# Patient Record
Sex: Female | Born: 1945
Health system: Southern US, Community
[De-identification: ages and names within clinical notes are randomized; demographics above are authoritative.]

## PROBLEM LIST (undated history)

## (undated) DIAGNOSIS — R011 Cardiac murmur, unspecified: Secondary | ICD-10-CM

## (undated) DIAGNOSIS — J45909 Unspecified asthma, uncomplicated: Secondary | ICD-10-CM

## (undated) DIAGNOSIS — F329 Major depressive disorder, single episode, unspecified: Secondary | ICD-10-CM

## (undated) DIAGNOSIS — J189 Pneumonia, unspecified organism: Secondary | ICD-10-CM

## (undated) DIAGNOSIS — G43909 Migraine, unspecified, not intractable, without status migrainosus: Secondary | ICD-10-CM

## (undated) DIAGNOSIS — I639 Cerebral infarction, unspecified: Secondary | ICD-10-CM

## (undated) DIAGNOSIS — Z8719 Personal history of other diseases of the digestive system: Secondary | ICD-10-CM

## (undated) DIAGNOSIS — Z87828 Personal history of other (healed) physical injury and trauma: Secondary | ICD-10-CM

## (undated) DIAGNOSIS — A70 Chlamydia psittaci infections: Secondary | ICD-10-CM

## (undated) DIAGNOSIS — Z8711 Personal history of peptic ulcer disease: Secondary | ICD-10-CM

## (undated) DIAGNOSIS — F32A Depression, unspecified: Secondary | ICD-10-CM

## (undated) DIAGNOSIS — Z87442 Personal history of urinary calculi: Secondary | ICD-10-CM

## (undated) DIAGNOSIS — H9190 Unspecified hearing loss, unspecified ear: Secondary | ICD-10-CM

## (undated) DIAGNOSIS — M1990 Unspecified osteoarthritis, unspecified site: Secondary | ICD-10-CM

## (undated) DIAGNOSIS — I4891 Unspecified atrial fibrillation: Secondary | ICD-10-CM

## (undated) DIAGNOSIS — D649 Anemia, unspecified: Secondary | ICD-10-CM

## (undated) HISTORY — PX: ROTATOR CUFF REPAIR: SHX139

## (undated) HISTORY — DX: Personal history of other diseases of the digestive system: Z87.19

## (undated) HISTORY — PX: PARTIAL HYSTERECTOMY: SHX80

## (undated) HISTORY — DX: Unspecified hearing loss, unspecified ear: H91.90

## (undated) HISTORY — DX: Personal history of peptic ulcer disease: Z87.11

## (undated) HISTORY — PX: ANKLE FRACTURE SURGERY: SHX122

## (undated) HISTORY — DX: Unspecified osteoarthritis, unspecified site: M19.90

## (undated) HISTORY — PX: BREAST LUMPECTOMY: SHX2

## (undated) HISTORY — PX: APPENDECTOMY: SHX54

## (undated) HISTORY — DX: Cerebral infarction, unspecified: I63.9

## (undated) HISTORY — DX: Personal history of other (healed) physical injury and trauma: Z87.828

## (undated) HISTORY — DX: Migraine, unspecified, not intractable, without status migrainosus: G43.909

---

## 2003-08-17 ENCOUNTER — Inpatient Hospital Stay (HOSPITAL_COMMUNITY): Admission: EM | Admit: 2003-08-17 | Discharge: 2003-08-19 | Payer: Self-pay | Admitting: Emergency Medicine

## 2003-08-17 ENCOUNTER — Encounter: Payer: Self-pay | Admitting: Emergency Medicine

## 2003-08-18 ENCOUNTER — Encounter: Payer: Self-pay | Admitting: Internal Medicine

## 2003-08-19 ENCOUNTER — Encounter: Payer: Self-pay | Admitting: *Deleted

## 2005-02-11 ENCOUNTER — Ambulatory Visit: Payer: Self-pay | Admitting: *Deleted

## 2005-02-14 ENCOUNTER — Ambulatory Visit (HOSPITAL_COMMUNITY): Admission: RE | Admit: 2005-02-14 | Discharge: 2005-02-14 | Payer: Self-pay | Admitting: *Deleted

## 2005-02-14 ENCOUNTER — Ambulatory Visit: Payer: Self-pay | Admitting: Cardiology

## 2005-02-20 ENCOUNTER — Ambulatory Visit: Payer: Self-pay | Admitting: *Deleted

## 2005-03-04 ENCOUNTER — Ambulatory Visit (HOSPITAL_COMMUNITY): Admission: RE | Admit: 2005-03-04 | Discharge: 2005-03-04 | Payer: Self-pay | Admitting: *Deleted

## 2005-03-06 ENCOUNTER — Inpatient Hospital Stay (HOSPITAL_BASED_OUTPATIENT_CLINIC_OR_DEPARTMENT_OTHER): Admission: RE | Admit: 2005-03-06 | Discharge: 2005-03-06 | Payer: Self-pay | Admitting: *Deleted

## 2005-03-06 ENCOUNTER — Ambulatory Visit: Payer: Self-pay | Admitting: *Deleted

## 2005-03-13 ENCOUNTER — Ambulatory Visit (HOSPITAL_COMMUNITY): Admission: RE | Admit: 2005-03-13 | Discharge: 2005-03-13 | Payer: Self-pay | Admitting: *Deleted

## 2005-03-13 ENCOUNTER — Ambulatory Visit: Payer: Self-pay | Admitting: *Deleted

## 2005-06-09 ENCOUNTER — Encounter: Admission: RE | Admit: 2005-06-09 | Discharge: 2005-06-09 | Payer: Self-pay | Admitting: Neurology

## 2006-07-18 ENCOUNTER — Ambulatory Visit (HOSPITAL_COMMUNITY): Admission: RE | Admit: 2006-07-18 | Discharge: 2006-07-18 | Payer: Self-pay | Admitting: Internal Medicine

## 2007-04-27 ENCOUNTER — Ambulatory Visit: Payer: Self-pay | Admitting: Orthopedic Surgery

## 2007-05-28 ENCOUNTER — Ambulatory Visit: Payer: Self-pay | Admitting: Orthopedic Surgery

## 2008-11-24 ENCOUNTER — Ambulatory Visit (HOSPITAL_COMMUNITY): Admission: RE | Admit: 2008-11-24 | Discharge: 2008-11-24 | Payer: Self-pay | Admitting: Internal Medicine

## 2010-04-30 ENCOUNTER — Ambulatory Visit (HOSPITAL_COMMUNITY): Admission: RE | Admit: 2010-04-30 | Discharge: 2010-04-30 | Payer: Self-pay | Admitting: Internal Medicine

## 2010-12-02 ENCOUNTER — Encounter: Payer: Self-pay | Admitting: Neurology

## 2011-03-29 NOTE — Group Therapy Note (Signed)
   [name], [name]                           ACCOUNT NO.:  [id number]   MEDICAL RECORD NO.:  [id number]                   PATIENT TYPE:  INP   LOCATION:  A214                                 FACILITY:  [facility]   PHYSICIAN:  Edward L. Juanetta Gosling, M.D.             DATE OF BIRTH:  December 18, 1945   DATE OF PROCEDURE:  08/18/2003  DATE OF DISCHARGE:                                   PROGRESS NOTE   SUBJECTIVE:  This is a patient who has an appointment to see Dr. Ouida Sills in  the next week or so and who has been admitted with hypertension which is  unusual for her, TIA-like symptoms having had a previous TIA some years ago,  and chest discomfort.  She says that she measured her blood pressure about  three to four months ago and it was 110/70 but in the last several hours it  has been in the 200/120 range.  She has been receiving intravenous labetalol  which has helped.   OBJECTIVE:  Her exam today shows her temperature is 98.5, pulse 56,  respirations 28, blood pressure 141/80, O2 saturations 96% on room air.  Her  chest is clear now, heart is regular, her abdomen is soft.  Neurologically  she is now intact.  She does not have any focal neurological findings.  I do  not hear any carotid bruits.  Other lab work now shows that her  sedimentation rate was 4 which of course makes temporal arteritis much less  likely; potassium 3.4, which was 5 on admission.  CK 131, MB 2.6, troponin  less than 0.01.   ASSESSMENT:  She has had problems that could be related to her heart, could  be related to transient ischemic attack, and she is hypertensive.  She has a  number of other medical problems.   PLAN:  Have her get echocardiogram, carotid arterial study, MRI, and I am  going to go ahead and see if we can get a cardiology consultation as well  since this actually all started with what she describes as severe chest  pain.      ___________________________________________           Oneal Deputy. Juanetta Gosling, M.D.   ELH/MEDQ  D:  08/18/2003  T:  08/18/2003  Job:  [id number]   cc:   Kingsley Callander. Ouida Sills, M.D.  [address]  [place]  [place] [address]  Fax: [phone]

## 2011-03-29 NOTE — H&P (Signed)
[name], [name]                           ACCOUNT NO.:  [id number]   MEDICAL RECORD NO.:  [id number]                   PATIENT TYPE:  INP   LOCATION:  A214                                 FACILITY:  [facility]   PHYSICIAN:  Jeoffrey Massed, M.D.             DATE OF BIRTH:  10-Feb-1946   DATE OF ADMISSION:  08/17/2003  DATE OF DISCHARGE:                                HISTORY & PHYSICAL   CHIEF COMPLAINT:  Facial numbness.   HISTORY OF PRESENT ILLNESS:  [name] is a 65 year old white female who  began having an episode of substernal crushing chest pain at rest, about  noon today.  She was diaphoretic and appeared pale.  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  The chest pain lasted about  30 seconds and has not recurred.  Soon after this, she began having right-  sided facial numbness and heaviness as well as right-sided neck and shoulder  numbness that extended down into her arm.  These parts of her body also felt  heavy.  She was slightly dysarthric but had no difficulty swallowing.  These  symptoms lasted about two hours.  She denied any palpitations or sensation  of skipped heart beat.   PAST MEDICAL HISTORY:  1. History of transient ischemic attack in 1995 with left-sided symptoms.     Workup per patient, was negative.  She has not been on aspirin since then     because of a history of peptic ulcer disease.  2. Asthma.  3. Depression/anxiety.  4. Attention-deficit hyperactivity disorder.  5. Migraine headaches.  6. Peptic ulcer disease diagnosed in 1998.  She has had a documented healing     of her ulcers shortly after that episode.  7. Of note, the patient has no history of hypertension, diabetes,     hypercholesterolemia, or coronary artery disease.  8. 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  The patient remarks that she was left over with     rheumatic fever after this  illness.   PAST SURGICAL HISTORY:  1. C-section x3.  2. Hysterectomy.  3. Ovarian wedge resection.  4. Appendectomy.  5. Left ankle surgery.  6. Right rotator cuff repair.   MEDICATIONS:  1. Serzone 600 mg p.o. daily.  2. Lexapro 60 mg p.o. daily.  3. Singulair 10 mg p.o. daily.  4. Zyrtec 10 mg p.o. daily.  5. Prevacid 30 mg p.o. daily.  6. Advair 100/50 one puff b.i.d.  7. Flonase two squirts each nostril daily.  8. Adderall 10 mg in the morning and 10 mg at noontime.  9. Ambien 10 mg q.h.s. p.r.n.  10.      Allergy shots.   ALLERGIES:  SULFA causes rash; TEQUIN causes throat swelling.   SOCIAL HISTORY/HABITS:  The patient is married  and has three grown children.  She recently lost her job in January, 2004 secondary to depression.  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  She  has no history of tobacco abuse and drinks alcohol socially and has no  history of drug use.   FAMILY HISTORY:  Father died of prostate cancer, age 56; mother died in a  motor vehicle accident, age 23; grandparents on both mother and father's  side had diabetes, hypertension and coronary artery disease; brother age 58  in good health.  She has three children, all in good health with the  exception of one who has depression.   REVIEW OF SYSTEMS:  Positive for visual blurriness, right eye greater than  left, intermittently over the last several weeks.  No hearing difficulties.  Right temple, right side of neck, and right upper shoulder/scapular muscle  pain over the last several weeks.  No cough or URI symptoms.  No abdominal  pain, nausea, vomiting, diarrhea or constipation.  No rash.  She has noted  edema in the lower extremities intermittently over the last several weeks,  this gets better at night.  No claudication.   PHYSICAL EXAMINATION:  VITAL SIGNS:  Temperature 96.9, pulse 66,  blood  pressure 172/95, O2 saturation 100% on 2 liter nasal cannula, respirations  20.  GENERAL:  Alert, attentive and pleasant.  No distress.  HEENT:  PERRLA, EOMI.  No scleral injection or drainage.  Tympanic membranes  unremarkable bilaterally.  Oropharynx with pink, moist mucosa without  lesion, erythema or exudate.  There was tenderness to palpation of the right  temporal area.  NECK:  Supple, slightly tender, bilateral anterocervical/submandibular  lymphadenopathy.  No thyromegaly.  No carotid bruits.  Carotid pulses 1+  bilaterally.  MUSCULOSKELETAL:  Slight tenderness to palpation of the right trapezius and  sternocleidomastoid muscles.  LUNGS:  Clear to auscultation bilaterally, breathing nonlabored.  CARDIOVASCULAR:  Regular rhythm and rate without murmur, rub or gallop.  ABDOMEN:  Soft, nontender, nondistended.  Bowel sounds are normoactive, no  hepatosplenomegaly, no masses.  EXTREMITIES:  No clubbing, cyanosis or edema.  2+ dorsalis pedis and  posterior tibial pulses bilaterally.  NEUROLOGIC:  Cranial nerves II-XII grossly intact bilaterally.  Upper  extremities show a 5/5 strength distally bilaterally, but 4/5 proximal  strength on the right compared to 5/5 on the left upper extremity.  No  sensory deficits.  Lower extremity strength 5/5 proximally and distally  bilaterally.  Biceps reflexes 2+ bilaterally.  Patellar tendon reflexes 2+  bilaterally.  No signs of cerebellar dysfunction.   LABORATORY DATA:  EKG showed normal sinus rhythm with a rate of 60.  No  ischemic changes.  Chest x-ray showed no cardiomegaly and no infiltrate.  Head CT was negative. CBC showed a white count of 5.8, hemoglobin 12,  platelet count 233.  BMET showed sodium 136, potassium 5.0, chloride 102,  bicarbonate 27, BUN 11, creatinine 0.8, glucose 89, calcium 9.9.  CK 183 and  CK-MB 3.3, relative index 1.8, troponin I 0.01.   ASSESSMENT/PLAN:  #1.  Transient neurologic deficits, consistent  with transient ischemic attack.  Head CT negative.  The patient was still with  slight right upper extremity proximal weakness.  Will admit for blood  pressure control/observation, and will institute workup for cerebrovascular  disease and will also check carotid artery Dopplers and 2D echocardiogram.  Have started her on aspirin daily.  Of note, with right-sided visual  symptoms and right temporal artery tenderness, will go ahead and check a PSR  for the possibility of temporal arteritis.   #2.  Chest pain.  Worrisome for unstable angina.  Enzymes negative x1.  Initial EKG without ischemia.  Will complete rule out and will leave to her  primary M.D. to further risk stratify.   #3.  Anxiety/depression.  Will continue home medications.  I feel like this  could definitely have been contributing to her high blood pressure today and  possibly even her chest pain.      ___________________________________________                                         Jeoffrey Massed, M.D.   PHM/MEDQ  D:  08/17/2003  T:  08/18/2003  Job:  [id number]   cc:   Kingsley Callander. Ouida Sills, M.D.  [address]  [place]  [place] [address]  Fax: [phone]

## 2011-03-29 NOTE — Procedures (Signed)
NAMEKOLLINS, FENTER                 ACCOUNT NO.:  [id number]   MEDICAL RECORD NO.:  [id number]          PATIENT TYPE:  OUT   LOCATION:  RAD                           FACILITY:  [facility]   PHYSICIAN:  Timpson Bing, M.D.  DATE OF BIRTH:  [birth year]   DATE OF PROCEDURE:  02/14/2005  DATE OF DISCHARGE:                                  ECHOCARDIOGRAM   REFERRING PHYSICIAN:  Kingsley Callander. Ouida Sills, MD/Jeffrey Dorethea Clan, M.D.   CLINICAL DATA:  A 65 year old woman with chest pain and amaurosis fugax.   BASELINE IMAGING:  Normal left ventricular size; no significant hypertrophy.  Normal regional and global left ventricular contractility with a  dyssynchronous motion pattern in the inferior and posterior basilar regions.  Overall image quality was suboptimal.   Treadmill exercise performed to a workload of 10 mets and a heart rate of  144, 88% of age-predicted maximum. Exercise discontinued due to fatigue.  Mild dizziness was reported at peak exertion.   Blood pressure increased from a resting value of 110/75 to 180/70 at peak  exercise, a normal response.   No important arrhythmias; frequent PVCs with a few PVC pairs were noted.   ELECTROCARDIOGRAM:  Normal sinus rhythm; delayed R-wave progression;  otherwise within normal limits.   STRESS ELECTROCARDIOGRAM:  Interpretation somewhat impaired by artifact; no  significant ST-segment depression identified.   POST-STRESS IMAGING:  Significant hypokinesis of the basilar inferoseptal  region immediately post-exercise imaging.   IMPRESSION:  Abnormal stress echocardiogram revealing adequate exercise  tolerance, no electrocardiographic evidence for ischemia and no angina. By  echocardiographic imaging, there was possible ischemia at the base of the  inferoseptal region. Imaging was suboptimal with substantial angulation of  the short axis images. Although this along with the baseline abnormalities  in a pattern of contractility could have caused the  observed wall motion  abnormality, the possibility of ischemia in this region must be considered.  Other findings as noted.     RR/MEDQ  D:  02/14/2005  T:  02/14/2005  Job:  [id number]

## 2011-03-29 NOTE — Discharge Summary (Signed)
[name], [name]                           ACCOUNT NO.:  [id number]   MEDICAL RECORD NO.:  [id number]                   PATIENT TYPE:  INP   LOCATION:  A214                                 FACILITY:  [facility]   PHYSICIAN:  Edward L. Juanetta Gosling, M.D.             DATE OF BIRTH:  11/06/1946   DATE OF ADMISSION:  08/17/2003  DATE OF DISCHARGE:                                 DISCHARGE SUMMARY   HISTORY OF PRESENT ILLNESS:  [name] is a 65 year old who came to the  emergency room after having had an episode of substernal crushing chest  pain at rest that happened at about noon on the day of admission.  At that  time she was diaphoretic and pale.  Her blood pressure was checked and it  was found to be in the 180s systolic over about 100 diastolic.  The pain  lasted 30 seconds or so and has not recurred but soon after that she began  having right-sided facial numbness and heaviness, right-sided neck and  shoulder numbness that went down into her arm.  She apparently was  dysarthric but had no difficulty with swallowing and this lasted about two  hours.  She has not had previous episodes of hypertension as far as is  known, although she has not had her blood pressure checked recently.   PAST MEDICAL HISTORY:  Positive for a TIA in 1995 with left-sided symptoms.  She has  history of asthma, anxiety and depression, attention deficit-  hyperactivity disorder, migraine headaches, peptic ulcer disease,  psittacosis, and rheumatic fever.   PHYSICAL EXAMINATION:  GENERAL/VITAL SIGNS:  Shows a well-developed, well-  nourished female who was in no acute distress.  Blood pressure on admission  172/95, O2 saturation 100% on 2 liters, temperature was 96.9, pulse 66.  General exam was unremarkable.  HEENT: Also unremarkable.  She did have some tenderness in the right  temporal area.  NECK:  Supple.  She did not have any bruits.  CHEST:  Clear.  HEART:  Regular without gallop.  ABDOMEN:  Soft without  masses.  NEUROLOGIC:  Cental nervous system examination showed 5/5 strength  bilaterally, 4/5 proximal strength on the right compared to 5/5 on the left,  no sensory defects, lower extremity strength was 5/5 bilaterally, reflexes  were 2+ bilaterally.   LABORATORY DATA:  EKG with no ischemic changes.  Chest x-ray showed no  infiltrate.  Head CT was negative.  CBC showed a white count of 5800,  hemoglobin of 12, platelets of 233.  BMET:  Sodium 136, potassium 5,  chloride 102, bicarb 27, BUN 11, creatinine 0.8, glucose 89, calcium 9.9.  CK 183, MB 3.3, relative index was 1.8, troponin I was less than 0.01.   HOSPITAL COURSE:  She was started on Plavix and was given labetalol  intravenously for blood pressure control.  Her blood pressure came down and  she required no  further labetalol after the initial two doses.  She had  cardiology consultation and underwent Cardiolite graded exercise test.  This  was felt to be negative for ischemia.  The concerns at the time of discharge  were that she might have a secondary cause of hypertension.  Echocardiogram  did not show any evidence of a coarctation of the aorta so that was felt to  be unlikely.  She was going to have an outpatient workup with a 24-hour  urine for VMA, she was going to have an outpatient workup for  hyperaldosteronism, and she is discharged home on her regular medications  which are:  1. Serzone 600 mg daily.  2. Lexapro 60 mg daily.  3. Singulair 10 mg daily.  4. Zyrtec 10 mg daily.  5. Prevacid 30 mg daily.  6. Advair 100/50 one puff b.i.d.  7. Flonase two inhalations each nostril daily.  8. Adderall 10 mg in the morning, 10 mg at noon.  9. Ambien 10 mg q.h.s. p.r.n.  10.      She takes allergy shots.  11.      She is going to be discharged on a beta blocker by Dr. Dionicio Stall     - I do not know which beta blocker he is going to choose or the dose at     this time.   Discharge is planned for August 19, 2003 and she  was cautioned to return to  the hospital for further symptoms similar to this and she is going to follow  up with Dr. Ouida Sills in his office.   FINAL DISCHARGE DIAGNOSES:  1. Chest pain, myocardial infarction ruled out.  2. Transient ischemic attack.  3. Asthma.  4. Anxiety and depression.  5. Attention deficit-hyperactivity disorder.  6. Migraine headaches.  7. Peptic ulcer disease.  Of note, she has a pending Helicobacter pylori     titer.   As mentioned, she is to follow up with Dr. Ouida Sills in his office.     ___________________________________________                                         Oneal Deputy. Juanetta Gosling, M.D.   ELH/MEDQ  D:  08/19/2003  T:  08/20/2003  Job:  [id number]   cc:   Kingsley Callander. Ouida Sills, M.D.  [address]  [place]  [place] [address]  Fax: [phone]

## 2011-03-29 NOTE — Procedures (Signed)
   NAMEMARJORIA, Smith                           ACCOUNT NO.:  [id number]   MEDICAL RECORD NO.:  [id number]                   PATIENT TYPE:  INP   LOCATION:  A214                                 FACILITY:  [facility]   PHYSICIAN:  Vida Roller, M.D.                DATE OF BIRTH:  [birth year]   DATE OF PROCEDURE:  08/18/2003  DATE OF DISCHARGE:                                  ECHOCARDIOGRAM   TAPE NUMBER:  [id number].   TAPE COUNT:  [id number].   HISTORY OF PRESENT ILLNESS:  This is a 65 year old woman with chest  discomfort and history of transischemic attack.   TECHNICAL QUALITY:  Good.   M-MODE TRACINGS:  The aorta is 33 mm.   Left atrium is 36 mm.   Septum is 14 mm.   Posterior wall is 9 mm.   The left ventricular diastolic dimension is 40 mm.   Left ventricular systolic dimension is 30 mm.   2-D AND DOPPLER IMAGING:  The left ventricle is normal size with normal  systolic function. No wall motion abnormalities are seen.   The right ventricle is normal size with normal systolic function. No wall  motion abnormalities are seen.   Both atria are normal size. There is no atrial septal defect seen.   The aortic valve is trileaflet, tri-commissural with no evidence of stenosis  or regurgitation.   The mitral valve is morphologically unremarkable with no stenosis or  regurgitation.   The tricuspid valve is morphologically unremarkable with trace  insufficiency. No stenosis is seen.   The pulmonic valve is morphologically unremarkable with trace insufficiency.  No stenosis is seen.   The pericardial structures are normal.   The ascending aorta is not well seen.   The inferior vena cava appears to be normal size.      ___________________________________________                                            Vida Roller, M.D.   JH/MEDQ  D:  08/18/2003  T:  08/18/2003  Job:  [id number]

## 2011-03-29 NOTE — Procedures (Signed)
   [name], [name]                           ACCOUNT NO.:  [id number]   MEDICAL RECORD NO.:  [id number]                   PATIENT TYPE:  INP   LOCATION:  A214                                 FACILITY:  [facility]   PHYSICIAN:  Vida Roller, M.D.                DATE OF BIRTH:  13-Sep-1946   DATE OF PROCEDURE:  DATE OF DISCHARGE:                                    STRESS TEST   PROCEDURE:  Exercise Cardiolite   INDICATIONS:  [name] is a 65 year old female with no known coronary  artery disease.  She presented with atypical chest discomfort.  She has had  2 sets of cardiac enzymes both of which were negative for acute myocardial  infarction.  She also presented with symptoms consistent with TIA; and she  is undergoing evaluation for this as well.   BASELINE DATA:  EKG reveals sinus rhythm at 54 beats/minute with nonspecific  ST abnormalities.  Blood pressure is 138/72.   The patient exercised for a total of 8 minutes and 17 seconds to Bruce  protocol stage 3.  Maximum heart rate achieved was 135 beats/minute which is  82% of predicted maximum.  Maximum blood pressure was 170/92.  The patient  had right-sided neck pain at the start which did not worsen with exercise.  She also developed bilateral knee pain with exercise and fatigue were the  reasons for stopping the test.   EKG revealed no ischemia and no arrhythmias.   Final images and results are pending MD review.     ________________________________________  ___________________________________________  Jae Dire, P.A. [facility]                      Vida Roller, M.D.   AB/MEDQ  D:  08/19/2003  T:  08/19/2003  Job:  [id number]

## 2011-03-29 NOTE — Procedures (Signed)
NAMEDEANNAH, ROSSI                 ACCOUNT NO.:  [id number]   MEDICAL RECORD NO.:  [id number]          PATIENT TYPE:  OUT   LOCATION:  RAD                           [facility]   PHYSICIAN:  Barneveld Bing, M.D.  DATE OF BIRTH:  May 26, 1946   DATE OF PROCEDURE:  02/14/2005  DATE OF DISCHARGE:                                  ECHOCARDIOGRAM   CLINICAL DATA:  A 65 year old woman with chest pain and amaurosis fugax. M-  mode aorta 3.3, left atrium 3.3, septum 1.3, posterior wall 1.2, LV diastole  3.5, LV systole 2.8.   1.  Technically adequate echocardiographic study.  2.  Normal left atrium, right atrium and right ventricle.  3.  Normal aortic and tricuspid valves.  4.  Normal pulmonic valve and proximal pulmonary artery.  5.  Delicate mitral valve; borderline MVP.  6.  Normal left ventricular size; mild hypertrophy; normal regional and      global LV systolic function except for dyssynchrony of the basil      posterior lateral wall.  7.  Normal IVC.  8.  Comparison with 08/18/2003: No significant interval change except for      the pattern of contractility noted above.      RR/MEDQ  D:  02/14/2005  T:  02/15/2005  Job:  [id number]

## 2011-03-29 NOTE — Group Therapy Note (Signed)
   [name], [name]                           ACCOUNT NO.:  [id number]   MEDICAL RECORD NO.:  [id number]                   PATIENT TYPE:  INP   LOCATION:  A214                                 [facility]   PHYSICIAN:  Edward L. [name], M.D.             DATE OF BIRTH:  05-20-1946   DATE OF PROCEDURE:  08/19/2003  DATE OF DISCHARGE:                                   PROGRESS NOTE   PROBLEM LIST:  1. Chest pain.  2. Transient ischemic attack-like symptoms.  3. Hypertension.   SUBJECTIVE:  [name] says she is feeling much better and has no more chest  pain.  She says that everything else is about the same.  She denies any  other symptoms.   OBJECTIVE:  Her physical examination today shows that her chest is clear,  her heart is regular, her abdomen is soft.  Her blood pressure went down to  80/50.  Her pulse is 67, respirations are 20.  Dr. Marchelle Folks note is  appreciated.   ASSESSMENT:  She appears to be hypotensive now, it is not quite clear what  that was from, and she has had chest pain, etc.   PLAN:  The plan would be to go ahead with a Cardiolite stress test, further  evaluation depending on results of that.      ___________________________________________                                            Oneal Deputy. [name], M.D.   ELH/MEDQ  D:  08/19/2003  T:  08/19/2003  Job:  [id number]

## 2011-03-29 NOTE — Cardiovascular Report (Signed)
[name], RHATIGAN                 ACCOUNT NO.:  [id number]   MEDICAL RECORD NO.:  [id number]          PATIENT TYPE:  OIB   LOCATION:  6501                         FACILITY:  [facility]   PHYSICIAN:  Vida Roller, M.D.   DATE OF BIRTH:  1946-08-01   DATE OF PROCEDURE:  03/06/2005  DATE OF DISCHARGE:                              CARDIAC CATHETERIZATION   PRIMARY CARE PHYSICIAN:  Kingsley Callander. Ouida Sills, MD   HISTORY OF PRESENT ILLNESS:  [name] is a woman who has a history of  chest discomfort and rheumatic fever who had an exercise perfusion study  which showed an area of fixed defect in the inferior wall and an  echocardiogram that showed evidence of basilar posterior lateral wall  dyssynchrony with no significant valvular heart disease.  We did a stress  Cardiolite which showed an inferior defect in 2004 as well as one here in  2006 and the decision was made to do heart catheterization.   DETAILS OF THE PROCEDURE:  After obtaining informed consent the patient was  brought to the cardiac catheterization laboratory in the fasting state.  There she was prepped and draped in the usual sterile manner.  Local  anesthetic was obtained over the right groin using 1% Lidocaine without  epinephrine.  The right femoral artery was cannulated using the modified  Seldinger technique with a #4 French 10 cm sheath and left heart  catheterization was performed using a left #4 [place] Judkins, right #4, an  angled pigtail catheter and a #4 [place] nontorquing right catheter.  The  right coronary artery was cannulated using the nontorquing catheter.  Left  ventriculography was performed in the 30 degree RAO view with the power  injector.  At the conclusion of the procedure the catheters were removed,  the patient was moved back to the cardiology holding area where the femoral  artery sheath was removed.  Hemostasis was obtained using direct manual  pressure.  At the conclusion of the hold there was no evidence of  ecchymosis  or hematoma formation and the distal pulses were intact.  Total fluoroscopic  time was five minutes.  Total anodized contrast was 100 cc.   RESULTS:  1.  Aortic pressure 153/76 with mean of 108 mmHg.  2.  Left ventricular pressure 154/13 with end diastolic pressure of 15 mmHg.  3.  The coronary angiogram shows left main coronary artery is a normal sized      artery which has luminal irregularities.  4.  The left anterior descending coronary artery is a moderate caliber      vessel which does not reach the apex, has one large bifurcating diagonal      branch and has luminal irregularities.  5.  The left circumflex coronary artery is a moderate caliber vessel with      two small obtuse marginal's and a moderate caliber posterior lateral      branch and it is angiographically unremarkable.  6.  The right coronary artery is a moderate caliber dominant vessel with a      single moderate caliber posterior descending coronary  artery with      luminal irregularities.  7.  The left ventriculogram revealed normal sized ventricles and ejection      fraction of 60%, no wall motion abnormalities, no mitral regurgitation.   ASSESSMENT:  1.  Nonobstructive coronary disease.  2.  Normal left ventricular systolic function.   PLAN:  We will treat this woman with medical therapy to address her cardiac  risk factors.      JH/MEDQ  D:  03/06/2005  T:  03/06/2005  Job:  [id number]   cc:   Kingsley Callander. Ouida Sills, MD  [address]  [place]  [place] [address]  Fax: [phone]

## 2011-03-29 NOTE — Consult Note (Signed)
[name], [name]                           ACCOUNT NO.:  [id number]   MEDICAL RECORD NO.:  [id number]                   PATIENT TYPE:  INP   LOCATION:  A214                                 FACILITY:  [facility]   PHYSICIAN:  Vida Roller, M.D.                DATE OF BIRTH:  1946-10-19   DATE OF CONSULTATION:  08/18/2003  DATE OF DISCHARGE:                                   CONSULTATION   CARDIOLOGY CONSULTATION   PRIMARY CARE Tessah Patchen:  Kingsley Callander. Ouida Sills, M.D.   REFERRING PHYSICIAN:  Edward L. Juanetta Gosling, M.D.   PSYCHIATRIST:  Dr. Delanna Ahmadi, Psychiatrist.   ADMITTING PHYSICIAN:  Jeoffrey Massed, M.D.   PRIMARY CARE PHYSICIAN:  Dr. Newt Minion manages the patient in [place],  [place].   HISTORY OF PRESENT ILLNESS:  [name] is a 65 year old female with no known  coronary artery disease who presented to the emergency department  complaining of chest discomfort.  She describes it as substernal chest  discomfort which had its onset at rest and was associated with diaphoresis  and progressed to significant severe chest discomfort with right facial  numbness radiating to her right shoulder and neck extending into her right  arm and she had right arm and neck heaviness with decrease in her ability to  move her right arm. She was treated in the emergency department for her  hypertension with significant and subsequent resolution of all of her  symptoms with also resolution of her severe hypertension.  Her blood  pressure was in the 170s/110s when she was evaluated and since that time she  has had no subsequent episodes.   She had a history of a TIA in 1995 with left-sided symptoms and had a  negative evaluation at that time for an etiology.  She has had peptic ulcer  disease in the past.  She has asthma.  She has severe depression and anxiety  and attention-deficit disorder as well as a history of migraines.  Her  migraine onset is particularly different from the onset of  this particular  episode with usually mild aura with pain in the posterior portion of her  head which then radiates forward.  She has never really had any evidence of  a complex migraine.  She had any evidence of a complex migraine. She had  pseudacousis as a child.  She has had a rotator cuff repair. She has had 3 c-  sections, a hysterectomy, an ovarian wedge resection, an appendectomy, and  she also has had surgery on her left ankle all of which were uncomplicated.   SOCIAL HISTORY:  She lives in [place] with her husband.  She recently  started a new job.  She works at the [employer] as a Nurse, adult.  She does  not smoke, does not drink alcohol, does not exercise particularly.  She  takes specifically an interesting diet that  is based on food allergies.   ALLERGIES:  She is allergic to SULFA and TEQUIN.  She also has trouble with  egg whites and mild products as well as wheat, so she eats a gluten-free  diet.    MEDICATIONS PRIOR TO ADMISSION:  1. Serzone 600 mg daily.  2. Lexapro 60 mg daily.  3. Singulair 10 mg a day.  4. Zyrtec 10 mg a day.  5. Prevacid 30 mg a day.  6. Advair 150, one twice a day.  7. Flonase once a day.  8. Adderall 10 mg in the morning and 10 mg at noon.  9. Ambien 10 mg at night as she needs it.  10.      She also has had allergy shots in the past.  11.      She takes Tylenol p.r.n.   MEDICATIONS IN THE HOSPITAL:  1. Adderall 10 mg twice a day.  2. Aspirin 325 mg a day.  3. Lexapro 60 mg a day.  4. Advair b.i.d.  5. Claritin 10 mg a day.  6. Nasonex once a day.  7. Singulair 10 mg a day.  8. Serzone 600 mg a day.  9. Protonix 40 mg a day.  10.      K-Dur 20 mEq twice a day.  11.      She is on labetalol, Tylenol, Ambien, and Ativan as she needs it     for blood pressure control and anxiety.   REVIEW OF SYSTEMS:  Her review of systems is generally positive, but not  particularly pertinent to the history of present illness.   PHYSICAL  EXAMINATION:  GENERAL:  On physical exam she is a well-developed,  well-nourished, white female in no apparent distress who is alert and  oriented x4 and a reasonably good historian.  VITAL SIGNS:  Her blood pressure was 114/68; her pulse is 52; respirations  are 16; and she is afebrile.  She weighs 170 pounds.  She is saturating 96%  on room air.  HEENT:  Examination of the head, eyes, ears, nose, and throat is  unremarkable.  She has some mild right temporal tenderness to palpation, but  it is not over the temporal artery.  NECK:  Her neck is supple with no jugular venous distention or carotid  bruits.  CHEST:  Her chest is clear to auscultation.  CARDIAC:  Cardiac exam reveals a nondisplaced point of maximal impulse with  no lifts or thrills.  First and second heart sounds are normal.  There is no  third heart sound, but there is an easily heard fourth heart sound.  She has  no murmurs.  ABDOMEN:  Her abdomen is soft, nontender with normoactive bowel sounds.  EXTREMITIES:  The lower extremities are without significant clubbing,  cyanosis, or edema.  MUSCULOSKELETAL: Her musculoskeletal exam is unremarkable.  NEUROLOGIC:  Her neurological exam is completely normal to my exam.  She had  a noncontrast head CT which was negative.  She had an MRI of her head which  shows some mild white matter changes, but no focal lesions. Her complete  breast exam was deferred.  Her electrocardiogram showed sinus bradycardia at  a rate of 50 with a normal axis and normal intervals. There is a question of  a mildly prolonged QT interval which is probably related to her medications  and there are no Q waves and there was no ST-T wave changes concerning for  ischemia.   LABORATORIES:  White blood cell count  of 5.8, H&H of 12 and 36 with a  platelet count of 233,000.  Sodium of 138, potassium of 3.9, chloride of 104, bicarb of 30, BUN of 11, creatinine of 0.9 with a blood sugar of 118.  Liver function  studies are normal.  Total protein is slightly low at 5.5  with an albumin of 3.3.  She has 2 sets of cardiac enzymes which are  inconsistent with acute myocardial infarction and her erythrocyte  sedimentation rate is 4.   ASSESSMENT AND RECOMMENDATIONS:  So our assessment is that this is a woman  with right-sided facial pain associated with upper extremity numbness and  heaviness which has resolved almost entirely.  It was associated with  relatively high blood pressure.  She has had a noncontrast head CT and an  MRI which are unremarkable.  Atypical chest discomfort which is in the  setting of hypertension.  She has anxiety and depression which are treated  medically.   So, I think, that it is probably reasonable at this point to pursue the  etiology of her hypertension as well as her atypical chest pain to ensure  that she does not have labile severe hypertension causing complex  neurological problems.  There is also a slight concern for temporal  arteritis with the right-sided temporal tenderness, however the ESR is  normal.  If this continues to be a problem and she develops anything like  tongue or jaw claudication, I think that a temporal artery biopsy would be a  reasonable consideration.  At this point, I would not treat with steroids as  I do not believe that this is a likely diagnosis.  However, it should be  kept in our differential.   With regarding her atypical chest pain, I think, that an echocardiogram is  fairly reasonable as well as an exercise Cardiolite which we will get  scheduled for tomorrow; and will follow her along from there.      ___________________________________________                                            Vida Roller, M.D.   JH/MEDQ  D:  08/18/2003  T:  08/18/2003  Job:  [id number]   cc:   Kingsley Callander. Ouida Sills, M.D.  [address]  [place]  [place] [address]  Fax: [phone]   Newt Minion  [address]  [place]  [place] [address]   Fax: [phone]   Delanna Ahmadi, Ph.D., Psychiatrist

## 2012-02-06 ENCOUNTER — Ambulatory Visit (HOSPITAL_COMMUNITY)
Admission: RE | Admit: 2012-02-06 | Discharge: 2012-02-06 | Disposition: A | Discharge status: Home / Self Care | Payer: PRIVATE HEALTH INSURANCE | Source: Ambulatory Visit | Attending: Internal Medicine | Admitting: Internal Medicine

## 2012-02-06 ENCOUNTER — Other Ambulatory Visit (HOSPITAL_COMMUNITY): Payer: Self-pay | Admitting: Internal Medicine

## 2012-02-06 DIAGNOSIS — R05 Cough: Secondary | ICD-10-CM

## 2012-02-06 DIAGNOSIS — R5383 Other fatigue: Secondary | ICD-10-CM | POA: Insufficient documentation

## 2012-02-06 DIAGNOSIS — R059 Cough, unspecified: Secondary | ICD-10-CM | POA: Insufficient documentation

## 2012-02-06 DIAGNOSIS — R5381 Other malaise: Secondary | ICD-10-CM | POA: Insufficient documentation

## 2013-03-02 ENCOUNTER — Ambulatory Visit (HOSPITAL_COMMUNITY)
Admission: RE | Admit: 2013-03-02 | Discharge: 2013-03-02 | Disposition: A | Discharge status: Home / Self Care | Payer: MEDICARE | Source: Ambulatory Visit | Attending: Internal Medicine | Admitting: Internal Medicine

## 2013-03-02 DIAGNOSIS — M5382 Other specified dorsopathies, cervical region: Secondary | ICD-10-CM | POA: Insufficient documentation

## 2013-03-02 DIAGNOSIS — M2569 Stiffness of other specified joint, not elsewhere classified: Secondary | ICD-10-CM | POA: Insufficient documentation

## 2013-03-02 DIAGNOSIS — M542 Cervicalgia: Secondary | ICD-10-CM | POA: Insufficient documentation

## 2013-03-02 DIAGNOSIS — M6281 Muscle weakness (generalized): Secondary | ICD-10-CM | POA: Insufficient documentation

## 2013-03-02 DIAGNOSIS — IMO0001 Reserved for inherently not codable concepts without codable children: Secondary | ICD-10-CM | POA: Insufficient documentation

## 2013-03-02 HISTORY — DX: Other specified dorsopathies, cervical region: M53.82

## 2013-03-02 NOTE — Evaluation (Signed)
Physical Therapy Evaluation  Patient Details  Name: [name] MRN: [id number] Date of Birth: 1946/03/25  Today's Date: 03/02/2013 Time: 1035-1100 PT Time Calculation (min): 25 min              Visit#: 1 of 8  Re-eval: 04/01/13 Assessment Diagnosis: cervical pain Prior Therapy: none  Authorization: BC[employer] managed medicare     Past Medical History: No past medical history on file. Past Surgical History: No past surgical history on file.  Subjective Symptoms/Limitations Symptoms: Pt states that she had neck pain for months.  She states the pain started without trauma or injury.  She states that her pain has been better for the past week but she is still not 100%.  She is experiencing pain on the left side of her neck from her mastoid to her shoulder.  She states driving and turning her head irritates her pain.  She has some tingling in her index, middle and ring finger but this is not all the time. She is being referred to therapy to improve her pain, ROM and strength.    How long can you sit comfortably?: increases pain after an hour. How long can you stand comfortably?: Pt injured her left foot so she does not stand How long can you walk comfortably?: does not walk  Pain Assessment Currently in Pain?: Yes (worst pain 9/10; best 3/10) Pain Score:   3 Pain Orientation: Left Pain Type: Chronic pain Pain Onset: More than a month ago Pain Frequency: Constant Pain Relieving Factors: biofreeze, lie down , ice Effect of Pain on Daily Activities: increases      Balance Screening Balance Screen Has the patient fallen in the past 6 months: No Has the patient had a decrease in activity level because of a fear of falling? : No Is the patient reluctant to leave their home because of a fear of falling? : No    Assessment Cervical AROM Cervical Flexion: decreased 20% reps did not increase pain Cervical Extension: decreased 50% reps  Cervical - Right Side Bend: decreased 20% reps  increase sx. Cervical - Left Side Bend: decreased 50% reps do not increase sx Cervical - Right Rotation: decreased 20% reps do not increase sx Cervical - Left Rotation: decreased  60% reps do not increase mm Cervical Strength Cervical Extension: 3-/5 Cervical - Right Side Bend: 2+/5 Cervical - Left Side Bend: 3-/5  Exercise/Treatments     Seated Exercises Cervical Isometrics: Extension;Right lateral flexion;Left lateral flexion;3 secs;5 reps Lateral Flexion: Right;Left;5 reps Shoulder Shrugs: 5 reps    Physical Therapy Assessment and Plan PT Assessment and Plan Clinical Impression Statement: Pt with decreased cervical ROM, Strength and increased pain that is limiting pt functional ability.  Pt will benefit from skilled therapy to improve strength, ROM and functional activity to improve pt quality of life. Pt will benefit from skilled therapeutic intervention in order to improve on the following deficits: Decreased activity tolerance;Decreased range of motion;Pain;Decreased strength;Increased muscle spasms Rehab Potential: Good PT Frequency: Min 2X/week PT Duration: 4 weeks PT Treatment/Interventions: Therapeutic exercise;Modalities;Manual techniques;Patient/family education PT Plan: begin manual techniques, w-back and progress cervical stabilization exercises with patient.    Goals Home Exercise Program Pt will Perform Home Exercise Program: Independently PT Short Term Goals Time to Complete Short Term Goals: 2 weeks PT Short Term Goal 1: Full ROM to allow safe driving. PT Short Term Goal 2: Pain to be no greater than a 4/10 PT Long Term Goals Time to Complete Long Term Goals: 4  weeks PT Long Term Goal 1: I in advance HEP PT Long Term Goal 2: Pt strength to be improved to at least 4/5 to allow pt to be able to sit for two hours without pain. Long Term Goal 3: Pt to be able to work on the computer for an hour without increased pain. Long Term Goal 4: Pt able to drive without  increased pain.  Problem List Patient Active Problem List  Diagnosis  . Stiffness of joint, not elsewhere classified, other specified site  . Weakness of neck    General Behavior During Therapy: WFL for tasks assessed/performed Cognition: WFL for tasks performed PT Plan of Care PT Home Exercise Plan: given  GP Functional Assessment Tool Used: clinical judgement Functional Limitation: Self care Self Care Current Status (W0981): At least 20 percent but less than 40 percent impaired, limited or restricted Self Care Goal Status (X9147): At least 1 percent but less than 20 percent impaired, limited or restricted  Keevon Henney,CINDY 03/02/2013, 12:10 PM  Physician Documentation Your signature is required to indicate approval of the treatment plan as stated above.  Please sign and either send electronically or make a copy of this report for your files and return this physician signed original.   Please mark one 1.__approve of plan  2. ___approve of plan with the following conditions.   ______________________________                                                          _____________________ Physician Signature                                                                                                             Date

## 2013-03-03 ENCOUNTER — Ambulatory Visit (HOSPITAL_COMMUNITY): Payer: PRIVATE HEALTH INSURANCE | Admitting: Physical Therapy

## 2013-03-08 ENCOUNTER — Ambulatory Visit (HOSPITAL_COMMUNITY)
Admission: RE | Admit: 2013-03-08 | Discharge: 2013-03-08 | Disposition: A | Discharge status: Home / Self Care | Payer: MEDICARE | Source: Ambulatory Visit | Attending: Internal Medicine | Admitting: Internal Medicine

## 2013-03-08 DIAGNOSIS — M5382 Other specified dorsopathies, cervical region: Secondary | ICD-10-CM

## 2013-03-08 NOTE — Progress Notes (Signed)
Physical Therapy Treatment Patient Details  Name: [name] MRN: [id number] Date of Birth: 04-Aug-1946  Today's Date: 03/08/2013 Time: 7829-5621 PT Time Calculation (min): 48 min  Visit#: 2 of 8  Re-eval: 04/01/13  charge:  There ex x 18; manual x 23.  Authorization: BCBS managed medicare  Subjective: Symptoms/Limitations Symptoms: Pt states she stubbed her toe this weekend.  When she did she lurched forward restraining her neck.  Pt states increased spasm and pain on the right side of her neck. Pain Assessment Currently in Pain?: Yes (moving it increases to 8) Pain Score:   4  Exercise/Treatments   Machines for Strengthening UBE (Upper Arm Bike): 4' at 1.0 Theraband Exercises Scapula Retraction: 10 reps Shoulder Extension: 10 reps Rows: 10 reps Supine Exercises Cervical Isometrics: Extension;Right lateral flexion;Left lateral flexion;5 reps  Manual Therapy Manual Therapy: Joint mobilization Joint Mobilization: To R cervical area Myofascial Release: To B cervical area to reduce myofascial restriction.   Grade II mobilization to joint to improve ROM.  Laser to R C2-3 area chronic continuous pain and stiffness giving 75J/cm2  Physical Therapy Assessment and Plan PT Assessment and Plan Clinical Impression Statement: Pt with decreased pain and increased rotation after today's treatment.   PT Plan: begin cervical retraction, standing flextion next treatment.    Goals  progressing  Problem List Patient Active Problem List   Diagnosis Date Noted  . Stiffness of joint, not elsewhere classified, other specified site 03/02/2013  . Weakness of neck 03/02/2013       GP    Festus Pursel,CINDY 03/08/2013, 12:18 PM

## 2013-03-16 ENCOUNTER — Ambulatory Visit (HOSPITAL_COMMUNITY)
Admission: RE | Admit: 2013-03-16 | Discharge: 2013-03-16 | Disposition: A | Discharge status: Home / Self Care | Payer: MEDICARE | Source: Ambulatory Visit | Attending: Internal Medicine | Admitting: Internal Medicine

## 2013-03-16 DIAGNOSIS — M542 Cervicalgia: Secondary | ICD-10-CM | POA: Insufficient documentation

## 2013-03-16 DIAGNOSIS — IMO0001 Reserved for inherently not codable concepts without codable children: Secondary | ICD-10-CM | POA: Insufficient documentation

## 2013-03-16 DIAGNOSIS — M6281 Muscle weakness (generalized): Secondary | ICD-10-CM | POA: Insufficient documentation

## 2013-03-16 NOTE — Progress Notes (Signed)
Physical Therapy Treatment Patient Details  Name: [name] MRN: [id number] Date of Birth: 1945-12-17  Today's Date: 03/16/2013 Time: 3244-0102 PT Time Calculation (min): 51 min  Visit#: 3 of 8  Re-eval: 04/01/13 Authorization: BCBS managed medicare  Charges:  therex 20', manual 24'  Subjective: Symptoms/Limitations Symptoms: Pt states it felt better after last visit, however did alot of driving since then and has flared it back up.  Currently 4/10. Pain Assessment Currently in Pain?: Yes Pain Score:   4 Pain Location: Neck   Exercise/Treatments Machines for Strengthening UBE (Upper Arm Bike): 4' at 1.0 Theraband Exercises Scapula Retraction: 10 reps;Green Shoulder Extension: 10 reps;Green Rows: 10 reps;Green Standing Exercises Upper Extremity Flexion with Stabilization: 10 reps;Limitations UE Flexion with Stabilization Limitations: against wall Other Standing Exercises: cervical retraction against wall 10 reps    Manual Therapy Manual Therapy: Other (comment) Other Manual Therapy: Laser X 4 placements to C2-3, chronic continous to decrease pain/stiffness.  STM and MFR to B cervical area to reduce adhesions/spasms.  Physical Therapy Assessment and Plan PT Assessment and Plan Clinical Impression Statement: Multiple spasms in B cervical area into mid traps/rhomboids.  Able to resolve 90% with return of full rotation at end of session.  Pt. reported painfree following manual therapy.  Began cervical retraction and UE flexion in standing with good core stability.  PT Plan: Progress with largest focus on manual (pt. is self-pay).       Lurena Nida, PTA/CLT 03/16/2013, 11:59 AM

## 2013-03-18 ENCOUNTER — Inpatient Hospital Stay (HOSPITAL_COMMUNITY): Admission: RE | Admit: 2013-03-18 | Payer: PRIVATE HEALTH INSURANCE | Source: Ambulatory Visit | Admitting: *Deleted

## 2013-03-22 ENCOUNTER — Telehealth (HOSPITAL_COMMUNITY): Payer: Self-pay

## 2013-03-23 ENCOUNTER — Ambulatory Visit (HOSPITAL_COMMUNITY): Payer: PRIVATE HEALTH INSURANCE | Admitting: *Deleted

## 2013-03-25 ENCOUNTER — Ambulatory Visit (HOSPITAL_COMMUNITY)
Admission: RE | Admit: 2013-03-25 | Discharge: 2013-03-25 | Disposition: A | Discharge status: Home / Self Care | Payer: MEDICARE | Source: Ambulatory Visit | Attending: Internal Medicine | Admitting: Internal Medicine

## 2013-03-25 NOTE — Progress Notes (Signed)
Physical Therapy Treatment Patient Details  Name: [name] MRN: [id number] Date of Birth: June 17, 1946  Today's Date: 03/25/2013 Time: 1104-1150 PT Time Calculation (min): 46 min  Visit#: 4 of 8  Re-eval: 04/01/13 Charges: Therex x 28' Manual x 15'  Authorization: BCBS managed medicare  Authorization Visit#: 4 of 10   Subjective: Symptoms/Limitations Symptoms: Pt states taht she did a lot of yard work yesterday so she is very tight. Pain Assessment Currently in Pain?: Yes Pain Score:   2 Pain Location: Neck Pain Orientation: Left   Exercise/Treatments Stretches Upper Trapezius Stretch: 2 reps;30 seconds Machines for Strengthening UBE (Upper Arm Bike): 4' at 2.0 Theraband Exercises Scapula Retraction: 10 reps;Green Shoulder Extension: 10 reps;Green Rows: 10 reps;Green Standing Exercises Upper Extremity Flexion with Stabilization: 10 reps;Limitations UE Flexion with Stabilization Limitations: against wall Seated Exercises Neck Retraction: 10 reps X to V: 10 reps W Back: 10 reps  Manual Therapy Manual Therapy: Other (comment) Myofascial Release: To B cervical area to reduce myofascial restriction.   Physical Therapy Assessment and Plan PT Assessment and Plan Clinical Impression Statement: Pt completes therex well after initial cueing and demo. Manual techniques completed to cervical area to decrease pain and tightness. Multiple spasms and tightness noted in bilateral upper trap and paraspinals.  Pt reports decrease pain at end of session. PT Plan: Progress with largest focus on manual (pt. is self-pay).       Problem List Patient Active Problem List   Diagnosis Date Noted  . Stiffness of joint, not elsewhere classified, other specified site 03/02/2013  . Weakness of neck 03/02/2013    PT - End of Session Activity Tolerance: Patient tolerated treatment well General Behavior During Therapy: [facility] for tasks assessed/performed Cognition: [facility] for tasks  performed  Seth Bake, PTA 03/25/2013, 12:09 PM

## 2013-03-30 ENCOUNTER — Ambulatory Visit (HOSPITAL_COMMUNITY)
Admission: RE | Admit: 2013-03-30 | Discharge: 2013-03-30 | Disposition: A | Discharge status: Home / Self Care | Payer: MEDICARE | Source: Ambulatory Visit | Attending: Internal Medicine | Admitting: Internal Medicine

## 2013-03-30 DIAGNOSIS — M5382 Other specified dorsopathies, cervical region: Secondary | ICD-10-CM

## 2013-03-30 NOTE — Progress Notes (Signed)
Physical Therapy Treatment Patient Details  Name: [name] MRN: [id number] Date of Birth: September 23, 1946  Today's Date: 03/30/2013 Time: 1106-1200 PT Time Calculation (min): 54 min Charge:  There ex 22'; manual x 24' Visit#: 5 of 8  Re-eval: 04/01/13    Authorization: BCBS managed medicare    Authorization Visit#: 5 of 8   Subjective: Symptoms/Limitations Symptoms: Pt states that the treatment that she underwent last time aggrevated her neck; states that it took three days before she could move her neck again. Pain Assessment Currently in Pain?: Yes Pain Score:   4 Pain Location: Neck Pain Orientation: Left Pain Type: Chronic pain   Exercise/Treatments  Stretches Upper Trapezius Stretch: 2 reps;30 seconds Machines for Strengthening UBE (Upper Arm Bike): 4' at 2.0 Theraband Exercises Scapula Retraction: 10 reps;Green Shoulder Extension: 10 reps;Green Rows: 10 reps;Green Standing Exercises Wall Push Ups: 10 reps Upper Extremity Flexion with Stabilization: 10 reps;Limitations UE Flexion with Stabilization Limitations: against wall Seated Exercises Neck Retraction: 10 reps   Manual Therapy Manual Therapy: Myofascial release Myofascial Release: To b cervical area to reduce restriction and improve motion Other Manual Therapy: Laser x 4 placement for mm stiffness along mid trap B at 7 J/cm2 1.24 seconds each.  Physical Therapy Assessment and Plan PT Assessment and Plan Clinical Impression Statement: Pt with increased mm tension and fascial restricitons that responded well to manual.  Pt reports decreased pain and improved motion at end of session.   PT Plan: Pt to begin prone row, w-back and shoulder extension next treatment.    Goals  progressing  Problem List Patient Active Problem List   Diagnosis Date Noted  . Stiffness of joint, not elsewhere classified, other specified site 03/02/2013  . Weakness of neck 03/02/2013    PT - End of Session Activity  Tolerance: Patient tolerated treatment well General Behavior During Therapy: [facility] for tasks assessed/performed Cognition: [facility] for tasks performed  GP Functional Assessment Tool Used: clinical judgement  RUSSELL,CINDY 03/30/2013, 12:06 PM

## 2013-04-01 ENCOUNTER — Ambulatory Visit (HOSPITAL_COMMUNITY)
Admission: RE | Admit: 2013-04-01 | Discharge: 2013-04-01 | Disposition: A | Discharge status: Home / Self Care | Payer: MEDICARE | Source: Ambulatory Visit | Attending: *Deleted | Admitting: *Deleted

## 2013-04-01 DIAGNOSIS — M5382 Other specified dorsopathies, cervical region: Secondary | ICD-10-CM

## 2013-04-01 NOTE — Progress Notes (Signed)
Physical Therapy Treatment Patient Details  Name: [name] MRN: [id number] Date of Birth: 1946-07-10 Charge:  IP, manual x 24. Today's Date: 04/01/2013 Time: 1350-1430 PT Time Calculation (min): 40 min  Visit#: 6 of 8   Authorization: [employer] managed medicare  Authorization Visit#: 6 of 8   Subjective: Symptoms/Limitations Symptoms: Pt state she just went to reach for something today and had massive LBP to where she is havinig difficulty walking.  States her husband had to help her into the car. Pain Assessment Currently in Pain?: Yes Pain Score: 0-No pain Pain Location: Neck Pain Orientation: Left Pain Type: Chronic pain Pain Onset: More than a month ago Multiple Pain Sites: Yes   Exercise/Treatments   PT completed Glut sets, ab sets, gentle trunk rotation to decrease lumbar spasm.  Modalities Modalities: Cryotherapy Manual Therapy Manual Therapy: Myofascial release Myofascial Release: To L cervical area to decrease adhesions to improve ROM Other Manual Therapy: Laser treatment for mm stiffness along L mid trap x 2 placement  using chronic setting  64J/cm2 at 1.24 sec; three treatments given for acute pain to L lumbar L34/45 L5-S1 area,   Cryotherapy Number Minutes Cryotherapy: 15 Minutes Cryotherapy Location: Back  Physical Therapy Assessment and Plan PT Assessment and Plan Clinical Impression Statement: Todays treatment focused on decreaseing pain and improving ROM.  Therapist was unable to complete cervical exercises as pt had acute low back pain episode with increased pain with any motion. PT Frequency: Min 2X/week PT Duration: 4 weeks PT Treatment/Interventions: Therapeutic exercise;Modalities;Manual techniques;Patient/family education PT Plan: Pt to begin prone row, w-back and shoulder extension next treatment.  Re assess next treatment   Problem List Patient Active Problem List   Diagnosis Date Noted  . Stiffness of joint, not elsewhere classified, other  specified site 03/02/2013  . Weakness of neck 03/02/2013       GP    Shandora Koogler,CINDY 04/01/2013, 5:07 PM

## 2013-04-07 ENCOUNTER — Ambulatory Visit (HOSPITAL_COMMUNITY)
Admission: RE | Admit: 2013-04-07 | Discharge: 2013-04-07 | Disposition: A | Discharge status: Home / Self Care | Payer: MEDICARE | Source: Ambulatory Visit | Attending: *Deleted | Admitting: *Deleted

## 2013-04-07 NOTE — Progress Notes (Signed)
Physical Therapy Treatment Patient Details  Name: [name] MRN: [id number] Date of Birth: 1946/04/04  Today's Date: 04/07/2013 Time: 1310 (pt 10 min late for appointment)-1345 PT Time Calculation (min): 35 min Charge:  1310-1325- there ex; 1330-1345 manual. Visit#: 7 of 8  Re-eval: 04/01/13    Authorization: [employer] managed medicare  Authorization Time Period:    Authorization Visit#: 7 of 8   Subjective: Symptoms/Limitations Symptoms: Pt is still having quite a bit of LBP from incident last week.  Neck pain is about a 5 Pain Assessment Currently in Pain?: Yes Pain Score:   5 Pain Location: Neck Pain Orientation: Left Pain Type: Chronic pain Pain Onset: More than a month ago Pain Frequency: Constant Multiple Pain Sites: Yes   Exercise/Treatments    Machines for Strengthening UBE (Upper Arm Bike): 4' at 2.0 Theraband Exercises Scapula Retraction: 10 reps;Green Shoulder Extension: 10 reps;Green Rows: 10 reps;Green  Manual Therapy Manual Therapy: Myofascial release Joint Mobilization: To cervical area with gentle manual traction to improve ROM Myofascial Release: To decrease adhesions to decrease pain and increase ROM Other Manual Therapy: Laser treatment for mm stiffness along Lmid trap x 4 placements 64J/cm2  Physical Therapy Assessment and Plan PT Assessment and Plan Clinical Impression Statement: Therapist was going to begin prone exercises for cervical stabilization but pt unable to tolerate due to increased LBP.  Pt was given t-band for home use.  We will not continue t-band ex in department  PT Frequency: Min 2X/week PT Plan: reassess next treatment begin midback stretch and prone exercises if able.      Goals Home Exercise Program Pt will Perform Home Exercise Program: Independently PT Short Term Goals PT Short Term Goal 1: Full ROM to allow safe driving. PT Short Term Goal 1 - Progress: Progressing toward goal PT Short Term Goal 2: Pain to be no greater  than a 4/10 PT Short Term Goal 2 - Progress: Progressing toward goal PT Long Term Goals Time to Complete Long Term Goals: 4 weeks PT Long Term Goal 1: I in advance HEP PT Long Term Goal 1 - Progress: Progressing toward goal PT Long Term Goal 2: Pt strength to be improved to at least 4/5 to allow pt to be able to sit for two hours without pain. PT Long Term Goal 2 - Progress: Progressing toward goal Long Term Goal 3: Pt to be able to work on the computer for an hour without increased pain. Long Term Goal 3 Progress: Progressing toward goal Long Term Goal 4: Pt able to drive without increased pain. Long Term Goal 4 Progress: Progressing toward goal  Problem List Patient Active Problem List   Diagnosis Date Noted  . Stiffness of joint, not elsewhere classified, other specified site 03/02/2013  . Weakness of neck 03/02/2013    PT - End of Session Activity Tolerance: Patient tolerated treatment well General Behavior During Therapy: [facility] for tasks assessed/performed Cognition: [facility] for tasks performed  GP Functional Assessment Tool Used: clinical judgement  Doretta Remmert,CINDY 04/07/2013, 4:54 PM

## 2013-04-13 ENCOUNTER — Ambulatory Visit (HOSPITAL_COMMUNITY)
Admission: RE | Admit: 2013-04-13 | Discharge: 2013-04-13 | Disposition: A | Discharge status: Home / Self Care | Payer: MEDICARE | Source: Ambulatory Visit | Attending: Internal Medicine | Admitting: Internal Medicine

## 2013-04-13 DIAGNOSIS — IMO0001 Reserved for inherently not codable concepts without codable children: Secondary | ICD-10-CM | POA: Insufficient documentation

## 2013-04-13 DIAGNOSIS — M542 Cervicalgia: Secondary | ICD-10-CM | POA: Insufficient documentation

## 2013-04-13 DIAGNOSIS — M6281 Muscle weakness (generalized): Secondary | ICD-10-CM | POA: Insufficient documentation

## 2013-04-13 NOTE — Evaluation (Addendum)
Physical Therapy Discharge Summary   Patient Details  Name: [name] MRN: [id number] Date of Birth: 15-Sep-1946  Today's Date: 04/13/2013 Time: 1355-1430 PT Time Calculation (min): 35 min              Visit#: 8 of 8  Re-eval: 04/01/13 Charges: Therex x 10' ROMM x 1 Self-care x 15'  Authorization: [employer] managed medicare     Authorization Visit#: 8 of 8   Past Medical History: No past medical history on file. Past Surgical History: No past surgical history on file.  Subjective Symptoms/Limitations Symptoms: Pt states that her pain has decreased since last session. Pt went to the chiropractor to have her back adjusted.  Pain Assessment Currently in Pain?: Yes Pain Score:   2 Pain Location: Neck Pain Orientation: Left  Assessment Cervical AROM Cervical Flexion: WNL Cervical Extension: Decreased 30% (was decreased 50%) Cervical - Right Side Bend: Decreased 20% reps increase sx. (was decreased 20%) Cervical - Left Side Bend: Decreased 50% reps do not increase sx (was decreased 50%) Cervical - Right Rotation: Decreased 15% reps do not increase sx (was decreased 20%) Cervical - Left Rotation: Decreased 20% (was decreased 60%) Cervical Strength Cervical Extension: 5/5 Cervical - Right Side Bend: 5/5 Cervical - Left Side Bend: 4/5  Exercise/Treatments Stretches Upper Trapezius Stretch: 1 rep;30 seconds Levator Stretch: 1 rep;30 seconds Machines for Strengthening UBE (Upper Arm Bike): 4' at 2.0 Theraband Exercises Scapula Retraction: 10 reps;Green Shoulder Extension: 10 reps;Green Rows: 10 reps;Green  Physical Therapy Assessment and Plan PT Assessment and Plan Clinical Impression Statement: Pt displays improved ROM and strength. Pt's pain has decreased but pt is not pain free. Pt is independent with HEP and comfortable with D/C. Instructed pt to focus on improving cervical ROM. PT Plan: Recommend D/C to HEP.    Goals Home Exercise Program Pt will Perform Home  Exercise Program: Independently PT Short Term Goals PT Short Term Goal 1: Full ROM to allow safe driving. PT Short Term Goal 2: Pain to be no greater than a 4/10 PT Short Term Goal 2 - Progress: Met PT Long Term Goals Time to Complete Long Term Goals: 4 weeks PT Long Term Goal 1: I in advance HEP PT Long Term Goal 1 - Progress: Met PT Long Term Goal 2: Pt strength to be improved to at least 4/5 to allow pt to be able to sit for two hours without pain. Long Term Goal 3: Pt to be able to work on the computer for an hour without increased pain. Long Term Goal 3 Progress: Progressing toward goal Long Term Goal 4: Pt able to drive without increased pain. Long Term Goal 4 Progress: Progressing toward goal  Problem List Patient Active Problem List   Diagnosis Date Noted  . Stiffness of joint, not elsewhere classified, other specified site 03/02/2013  . Weakness of neck 03/02/2013    PT - End of Session Activity Tolerance: Patient tolerated treatment well General Behavior During Therapy: WFL for tasks assessed/performed Cognition: WFL for tasks performed  GP Functional Assessment Tool Used: clinical judgement Functional Limitation: Self care Self Care Goal Status (W0981): At least 1 percent but less than 20 percent impaired, limited or restricted Self Care Discharge Status [phone]): At least 1 percent but less than 20 percent impaired, limited or restricted  Seth Bake, PTA  04/13/2013, 5:07 PM  Physician Documentation Your signature is required to indicate approval of the treatment plan as stated above.  Please sign and either send electronically or make  a copy of this report for your files and return this physician signed original.   Please mark one 1.__approve of plan  2. ___approve of plan with the following conditions.   ______________________________                                                          _____________________ Physician Signature                                                                                                              Date

## 2013-04-15 ENCOUNTER — Ambulatory Visit (HOSPITAL_COMMUNITY): Payer: MEDICARE | Admitting: Physical Therapy

## 2013-04-19 ENCOUNTER — Ambulatory Visit (HOSPITAL_COMMUNITY): Payer: MEDICARE | Admitting: *Deleted

## 2013-12-09 ENCOUNTER — Ambulatory Visit (INDEPENDENT_AMBULATORY_CARE_PROVIDER_SITE_OTHER): Payer: Medicare Other

## 2013-12-09 ENCOUNTER — Ambulatory Visit (INDEPENDENT_AMBULATORY_CARE_PROVIDER_SITE_OTHER): Payer: Medicare Other | Admitting: Orthopedic Surgery

## 2013-12-09 VITALS — BP 131/82 | Ht 65.5 in | Wt 198.0 lb

## 2013-12-09 DIAGNOSIS — IMO0002 Reserved for concepts with insufficient information to code with codable children: Secondary | ICD-10-CM

## 2013-12-09 DIAGNOSIS — M179 Osteoarthritis of knee, unspecified: Secondary | ICD-10-CM

## 2013-12-09 DIAGNOSIS — M25562 Pain in left knee: Secondary | ICD-10-CM

## 2013-12-09 DIAGNOSIS — M25569 Pain in unspecified knee: Secondary | ICD-10-CM

## 2013-12-09 DIAGNOSIS — M1710 Unilateral primary osteoarthritis, unspecified knee: Secondary | ICD-10-CM

## 2013-12-09 DIAGNOSIS — M7120 Synovial cyst of popliteal space [Baker], unspecified knee: Secondary | ICD-10-CM

## 2013-12-09 NOTE — Patient Instructions (Signed)
You have received a steroid shot. 15% of patients experience increased pain at the injection site with in the next 24 hours. This is best treated with ice and tylenol extra strength 2 tabs every 8 hours. If you are still having pain please call the office.    

## 2013-12-09 NOTE — Progress Notes (Signed)
Patient ID: [name], female   DOB: 1946-01-16, 68 y.o.   MRN: [id number]  Chief Complaint  Patient presents with  . Knee Pain    Left knee pain and swelling    BP 131/82  Ht 5' 5.5" (1.664 m)  Wt 198 lb (89.812 kg)  BMI 32.44 kg/m2  Encounter Diagnoses  Name Primary?  . Left knee pain Yes  . Baker's cyst of knee   . Arthritis of knee, degenerative     Chief complaint pain posterior aspect of the knee with swelling and tightness times one week. Patient reports swelling in the back of the knee associated with loss of motion in the knee difficulty walking. She reports 3/10 pain which is worse when she's getting up from a seated position she denies any trauma symptoms came on gradually describes a throbbing and burning denies back pain  Review of systems shortness of breath constipation joint pain with stiffness and muscle pain dryness of the skin unsteady gait depression seasonal allergies the other systems were reviewed were normal  Allergy to sulfa Monistat Ticlid history of depression neck pain left great toe bunion shoulder pain status post hysterectomy 3 cesarean sections open treatment internal fixation left ankle appendectomy right rotator cuff repair  Current medications are Cymbalta 60 mg twice a day , nefazodone 200mg  twice a day  Vs: BP 131/82  Ht 5' 5.5" (1.664 m)  Wt 198 lb (89.812 kg)  BMI 32.44 kg/m2  The patient is well-developed and well-nourished grooming and hygiene are normal. They are oriented to time person and place. Mood and affect normal.   Ambulation status slight limp left lower chest remedy favored. Examination of the  left knee no tenderness in the front of the knee tenderness in the back of the knee and also in the back of the thigh question nerve impingement range of motion is good stability tests are normal strength is normal provocative tests are negative  Skin is normal without rash lesion or ulcer  Pulse and temperature are normal without  edema or swelling  Normal sensation with no pathologic reflexes  X-rays show degenerative joint disease  Encounter Diagnoses  Name Primary?  . Left knee pain Yes  . Baker's cyst of knee   . Arthritis of knee, degenerative     Knee  Injection Procedure Note  Pre-operative Diagnosis: left knee oa  Post-operative Diagnosis: same  Indications: pain  Anesthesia: ethyl chloride   Procedure Details   Verbal consent was obtained for the procedure. Time out was completed.The joint was prepped with alcohol, followed by  Ethyl chloride spray and A 20 gauge needle was inserted into the knee via lateral approach; 65ml 1% lidocaine and 1 ml of depomedrol  was then injected into the joint . The needle was removed and the area cleansed and dressed.  Complications:  None; patient tolerated the procedure well.

## 2013-12-30 ENCOUNTER — Ambulatory Visit: Payer: Medicare Other | Admitting: Orthopedic Surgery

## 2014-01-04 ENCOUNTER — Ambulatory Visit (HOSPITAL_COMMUNITY): Payer: Medicare Other | Admitting: Physical Therapy

## 2014-01-05 ENCOUNTER — Encounter (HOSPITAL_COMMUNITY): Payer: Self-pay | Admitting: Emergency Medicine

## 2014-01-05 ENCOUNTER — Emergency Department (HOSPITAL_COMMUNITY)
Admission: EM | Admit: 2014-01-05 | Discharge: 2014-01-05 | Disposition: A | Discharge status: Home / Self Care | Payer: Medicare Other | Attending: Emergency Medicine | Admitting: Emergency Medicine

## 2014-01-05 ENCOUNTER — Emergency Department (HOSPITAL_COMMUNITY): Payer: Medicare Other

## 2014-01-05 DIAGNOSIS — Y9389 Activity, other specified: Secondary | ICD-10-CM | POA: Insufficient documentation

## 2014-01-05 DIAGNOSIS — IMO0002 Reserved for concepts with insufficient information to code with codable children: Secondary | ICD-10-CM | POA: Insufficient documentation

## 2014-01-05 DIAGNOSIS — W261XXA Contact with sword or dagger, initial encounter: Secondary | ICD-10-CM

## 2014-01-05 DIAGNOSIS — W260XXA Contact with knife, initial encounter: Secondary | ICD-10-CM | POA: Insufficient documentation

## 2014-01-05 DIAGNOSIS — S6990XA Unspecified injury of unspecified wrist, hand and finger(s), initial encounter: Secondary | ICD-10-CM | POA: Insufficient documentation

## 2014-01-05 DIAGNOSIS — F329 Major depressive disorder, single episode, unspecified: Secondary | ICD-10-CM | POA: Insufficient documentation

## 2014-01-05 DIAGNOSIS — S61219A Laceration without foreign body of unspecified finger without damage to nail, initial encounter: Secondary | ICD-10-CM

## 2014-01-05 DIAGNOSIS — Z79899 Other long term (current) drug therapy: Secondary | ICD-10-CM | POA: Insufficient documentation

## 2014-01-05 DIAGNOSIS — F3289 Other specified depressive episodes: Secondary | ICD-10-CM | POA: Insufficient documentation

## 2014-01-05 DIAGNOSIS — S61209A Unspecified open wound of unspecified finger without damage to nail, initial encounter: Secondary | ICD-10-CM | POA: Insufficient documentation

## 2014-01-05 DIAGNOSIS — Y929 Unspecified place or not applicable: Secondary | ICD-10-CM | POA: Insufficient documentation

## 2014-01-05 DIAGNOSIS — Z23 Encounter for immunization: Secondary | ICD-10-CM | POA: Insufficient documentation

## 2014-01-05 HISTORY — DX: Major depressive disorder, single episode, unspecified: F32.9

## 2014-01-05 HISTORY — DX: Depression, unspecified: F32.A

## 2014-01-05 MED ORDER — HYDROCODONE-ACETAMINOPHEN 5-325 MG PO TABS
1.0000 | ORAL_TABLET | Freq: Once | ORAL | Status: AC
  Administered 2014-01-05: 1 via ORAL
  Filled 2014-01-05: qty 1

## 2014-01-05 MED ORDER — LIDOCAINE HCL (PF) 1 % IJ SOLN
5.0000 mL | Freq: Once | INTRAMUSCULAR | Status: AC
  Administered 2014-01-05: 5 mL via INTRADERMAL
  Filled 2014-01-05: qty 5

## 2014-01-05 MED ORDER — HYDROCODONE-ACETAMINOPHEN 5-325 MG PO TABS: ORAL_TABLET | ORAL | Status: DC

## 2014-01-05 MED ORDER — HYDROCODONE-ACETAMINOPHEN 7.5-325 MG/15ML PO SOLN: 10.0000 mL | Freq: Once | ORAL | Status: DC

## 2014-01-05 MED ORDER — TETANUS-DIPHTH-ACELL PERTUSSIS 5-2.5-18.5 LF-MCG/0.5 IM SUSP
0.5000 mL | Freq: Once | INTRAMUSCULAR | Status: AC
  Administered 2014-01-05: 0.5 mL via INTRAMUSCULAR
  Filled 2014-01-05: qty 0.5

## 2014-01-05 NOTE — Discharge Instructions (Signed)
Laceration Care, Adult °A laceration is a cut that goes through all layers of the skin. The cut goes into the tissue beneath the skin. °HOME CARE °For stitches (sutures) or staples: °· Keep the cut clean and dry. °· If you have a bandage (dressing), change it at least once a day. Change the bandage if it gets wet or dirty, or as told by your doctor. °· Wash the cut with soap and water 2 times a day. Rinse the cut with water. Pat it dry with a clean towel. °· Put a thin layer of medicated cream on the cut as told by your doctor. °· You may shower after the first 24 hours. Do not soak the cut in water until the stitches are removed. °· Only take medicines as told by your doctor. °· Have your stitches or staples removed as told by your doctor. °For skin adhesive strips: °· Keep the cut clean and dry. °· Do not get the strips wet. You may take a bath, but be careful to keep the cut dry. °· If the cut gets wet, pat it dry with a clean towel. °· The strips will fall off on their own. Do not remove the strips that are still stuck to the cut. °For wound glue: °· You may shower or take baths. Do not soak or scrub the cut. Do not swim. Avoid heavy sweating until the glue falls off on its own. After a shower or bath, pat the cut dry with a clean towel. °· Do not put medicine on your cut until the glue falls off. °· If you have a bandage, do not put tape over the glue. °· Avoid lots of sunlight or tanning lamps until the glue falls off. Put sunscreen on the cut for the first year to reduce your scar. °· The glue will fall off on its own. Do not pick at the glue. °You may need a tetanus shot if: °· You cannot remember when you had your last tetanus shot. °· You have never had a tetanus shot. °If you need a tetanus shot and you choose not to have one, you may get tetanus. Sickness from tetanus can be serious. °GET HELP RIGHT AWAY IF:  °· Your pain does not get better with medicine. °· Your arm, hand, leg, or foot loses feeling  (numbness) or changes color. °· Your cut is bleeding. °· Your joint feels weak, or you cannot use your joint. °· You have painful lumps on your body. °· Your cut is red, puffy (swollen), or painful. °· You have a red line on the skin near the cut. °· You have yellowish-white fluid (pus) coming from the cut. °· You have a fever. °· You have a bad smell coming from the cut or bandage. °· Your cut breaks open before or after stitches are removed. °· You notice something coming out of the cut, such as wood or glass. °· You cannot move a finger or toe. °MAKE SURE YOU:  °· Understand these instructions. °· Will watch your condition. °· Will get help right away if you are not doing well or get worse. °Document Released: 04/15/2008 Document Revised: 01/20/2012 Document Reviewed: 04/23/2011 °[employer]® Patient Information ©2014 [employer]. ° ° ° ° °

## 2014-01-05 NOTE — ED Notes (Signed)
Site wrapped with telfa and gauze dressing. Pt tolerated well.

## 2014-01-05 NOTE — ED Notes (Signed)
Right finger pointer finger laceration.

## 2014-01-05 NOTE — ED Provider Notes (Signed)
CSN: [id number]     Arrival date & time 01/05/14  1234 History   First MD Initiated Contact with Patient 01/05/14 1300     Chief Complaint  Patient presents with  . Finger Injury     (Consider location/radiation/quality/duration/timing/severity/associated sxs/prior Treatment) Patient is a 68 y.o. female presenting with hand pain. The history is provided by the patient.  Hand Pain This is a new problem. The current episode started today. The problem occurs constantly. The problem has been unchanged. Pertinent negatives include no arthralgias, chills, fever, joint swelling, numbness, rash or weakness. The symptoms are aggravated by bending. Treatments tried: pressure. The treatment provided mild relief.    [name] is a 68 y.o. female who presents to the Emergency Department complaining of laceration to her right index finger that occurred while cutting an onion.  She denies weakness or numbness of the finger.  States that her last tetanus vaccination is unknown.  Past Medical History  Diagnosis Date  . Depression    Past Surgical History  Procedure Laterality Date  . Cesarean section    . Rotator cuff repair    . Ankle fracture surgery    . Breast lumpectomy    . Appendectomy     History reviewed. No pertinent family history. History  Substance Use Topics  . Smoking status: Never Smoker   . Smokeless tobacco: Not on file  . Alcohol Use: No   OB History   Grav Para Term Preterm Abortions TAB SAB Ect Mult Living                 Review of Systems  Constitutional: Negative for fever and chills.  Musculoskeletal: Negative for arthralgias, back pain and joint swelling.  Skin: Positive for wound. Negative for rash.       Laceration   Neurological: Negative for dizziness, weakness and numbness.  Hematological: Does not bruise/bleed easily.  All other systems reviewed and are negative.      Allergies  Ticlid; Monistat; and Sulfa antibiotics  Home Medications    Current Outpatient Rx  Name  Route  Sig  Dispense  Refill  . acetaminophen (TYLENOL) 500 MG tablet   Oral   Take 1,000 mg by mouth daily as needed for moderate pain.         . cefdinir (OMNICEF) 300 MG capsule   Oral   Take 300 mg by mouth 2 (two) times daily.         . Cholecalciferol (VITAMIN D PO)   Oral   Take 1 tablet by mouth daily.         . DULoxetine (CYMBALTA) 60 MG capsule   Oral   Take 120 mg by mouth at bedtime.         . nefazodone (SERZONE) 200 MG tablet   Oral   Take 400 mg by mouth at bedtime.         . predniSONE (DELTASONE) 10 MG tablet   Oral   Take 0.5-40 mg by mouth as directed. 4 tablets for 2 days; 3 tablets for 2 days; 2 tablets for 2 days; 1 tablet for 2 days; 1/2 tablet for 2 days. Then stop.         . SUMAtriptan (IMITREX) 100 MG tablet   Oral   Take 100 mg by mouth every 2 (two) hours as needed for migraine or headache. May repeat in 2 hours if headache persists or recurs.          BP 103/45  Pulse 96  Temp(Src) 97.6 F (36.4 C) (Oral)  Resp 18  Wt 195 lb (88.451 kg)  SpO2 98% Physical Exam  Nursing note and vitals reviewed. Constitutional: She is oriented to person, place, and time. She appears well-developed and well-nourished. No distress.  HENT:  Head: Normocephalic and atraumatic.  Cardiovascular: Normal rate, regular rhythm, normal heart sounds and intact distal pulses.   No murmur heard. Pulmonary/Chest: Effort normal and breath sounds normal. No respiratory distress.  Musculoskeletal: Normal range of motion. She exhibits no edema.       Right hand: She exhibits tenderness and laceration. She exhibits normal range of motion, no bony tenderness, normal two-point discrimination, normal capillary refill and no swelling. Normal sensation noted. Normal strength noted. She exhibits no finger abduction and no thumb/finger opposition.       Hands: heberden's nodes present to DIP joints of the bilateral fingers.   Neurological: She is alert and oriented to person, place, and time. She exhibits normal muscle tone. Coordination normal.  Skin: Skin is warm. Laceration noted.  Laceration to the distal right index finger.  Bleeding controlled.  Pt has FROM of the finger.  Distal sensation intact.  No FB's seen    ED Course  Procedures (including critical care time) Labs Review Labs Reviewed - No data to display Imaging Review Dg Finger Index Right  01/05/2014   CLINICAL DATA:  Laceration.  EXAM: RIGHT INDEX FINGER 2+V  COMPARISON:  None.  FINDINGS: Advanced osteoarthritic type degenerative changes involving the DIP and PIP joints of the finger and mild degenerative changes at the metacarpophalangeal joint. No acute bony findings or radiopaque foreign body.  IMPRESSION: Advanced degenerative changes but no fracture or foreign body.   Electronically Signed   By: Kalman Jewels M.D.   On: 01/05/2014 14:48    EKG Interpretation   None       MDM   Final diagnoses:  Finger laceration    LACERATION REPAIR Performed by: Almond Fitzgibbon L. Authorized by: Hale Bogus Consent: Verbal consent obtained. Risks and benefits: risks, benefits and alternatives were discussed Consent given by: patient Patient identity confirmed: provided demographic data Prepped and Draped in normal sterile fashion Wound explored  Laceration Location: distal right index finger Laceration Length: 2 cm  No Foreign Bodies seen or palpated  Anesthesia: local infiltration  Local anesthetic: lidocaine 1% w/o epinephrine  Anesthetic total: 2 ml  Irrigation method: syringe Amount of cleaning: standard  Skin closure: 4-0 prolene  Number of sutures: 4  Technique: simple interrupted  Patient tolerance: Patient tolerated the procedure well with no immediate complications.    Tdap updated.  Pt has full ROM of the finger.  NV intact.  Advised to f/u with PMD in 10 days for suture removal, return ere if any signs  of infection, pt agrees  The patient appears reasonably screened and/or stabilized for discharge and I doubt any other medical condition or other [facility] requiring further screening, evaluation, or treatment in the ED at this time prior to discharge.   Janeice Stegall L. Vanessa [place], PA-C 01/06/14 1627

## 2014-01-06 ENCOUNTER — Ambulatory Visit (HOSPITAL_COMMUNITY): Payer: Medicare Other | Admitting: Physical Therapy

## 2014-01-06 ENCOUNTER — Ambulatory Visit: Payer: Medicare Other | Admitting: Orthopedic Surgery

## 2014-01-06 NOTE — ED Provider Notes (Signed)
Medical screening examination/treatment/procedure(s) were performed by non-physician practitioner and as supervising physician I was immediately available for consultation/collaboration.  EKG Interpretation   None         Hutton Pellicane M Shanah Guimaraes, DO 01/06/14 1849 

## 2014-01-11 ENCOUNTER — Ambulatory Visit (HOSPITAL_COMMUNITY): Payer: Medicare Other | Admitting: Physical Therapy

## 2014-01-13 ENCOUNTER — Ambulatory Visit (HOSPITAL_COMMUNITY)
Admission: RE | Admit: 2014-01-13 | Discharge: 2014-01-13 | Disposition: A | Discharge status: Home / Self Care | Payer: Medicare Other | Source: Ambulatory Visit | Attending: Orthopedic Surgery | Admitting: Orthopedic Surgery

## 2014-01-13 DIAGNOSIS — M542 Cervicalgia: Secondary | ICD-10-CM | POA: Insufficient documentation

## 2014-01-13 DIAGNOSIS — M6281 Muscle weakness (generalized): Secondary | ICD-10-CM | POA: Insufficient documentation

## 2014-01-13 DIAGNOSIS — IMO0001 Reserved for inherently not codable concepts without codable children: Secondary | ICD-10-CM | POA: Insufficient documentation

## 2014-01-13 DIAGNOSIS — G8929 Other chronic pain: Secondary | ICD-10-CM | POA: Insufficient documentation

## 2014-01-13 DIAGNOSIS — M2569 Stiffness of other specified joint, not elsewhere classified: Secondary | ICD-10-CM | POA: Insufficient documentation

## 2014-01-13 DIAGNOSIS — M2560 Stiffness of unspecified joint, not elsewhere classified: Secondary | ICD-10-CM

## 2014-01-13 NOTE — Evaluation (Addendum)
Physical Therapy Evaluation  Patient Details  Name: [name] MRN: [id number] Date of Birth: 19-Aug-1946  Today's Date: 01/13/2014 Time: 1300-1405 PT Time Calculation (min): 65 min 1300- 1330 evaluation  1330 - 1345 manual  9509 - 1355  Ultrasound             Visit#: 1 of 8  Re-eval: 02/12/14 Assessment Diagnosis: neck pain  Next MD Visit: Dr. Noemi Chapel  Prior Therapy: yes   Authorization: BCBS  35 co pay - patient requests 1 x week  per co pay     Authorization Time Period:    Authorization Visit#: 1 of 8   Past Medical History:  Past Medical History  Diagnosis Date  . Depression    Past Surgical History:  Past Surgical History  Procedure Laterality Date  . Cesarean section    . Rotator cuff repair    . Ankle fracture surgery    . Breast lumpectomy    . Appendectomy      Subjective Symptoms/Limitations Symptoms: ongoing neck pain worsening over past 2 years, using prednisone, completed last pill today, previous PT this summer, also noticed worse with cold weather, c/o limited movement  Pertinent History: 68 yr old female , neck arthritis  How long can you sit comfortably?: 30 min  Special Tests: x rays, no recent MRI  Patient Stated Goals: decrease pain, improve ROM ,  Pain Assessment Currently in Pain?: Yes Pain Score: 5 /10  Pain Location: Neck Pain Type: Chronic pain Pain Frequency: Intermittent Pain Relieving Factors: prednisone  Effect of Pain on Daily Activities: bothered most by sitting, reading       Prior Functioning ; intemittent neck pain over past 2 years , independent with ADLS  Prior Function Vocation: Retired Leisure: Hobbies-yes (Comment) Comments: yard work, sewing    Sensation/Coordination/Flexibility/Functional Tests Functional Tests: FOTO 42   Assessment Cervical AROM Cervical Flexion: 25 Cervical Extension: 30  Cervical - Right Side Bend: 10 Cervical - Left Side Bend: 15  Cervical - Right Rotation: 50 Cervical - Left  Rotation: 35  Cervical PROM Cervical - Right Side Bend: 40 Cervical - Left Side Bend: 40 Cervical - Right Rotation: 60 Cervical - Left Rotation: 60 Palpation Palpation: increased fascial restruiction right upper trapezius mid belly, mid tenderess along R rhomboid  posture : forward head  Negative  spurlings testing, B UE AROM WNL  Exercise/Treatments      Modalities Modalities: Ultrasound Manual Therapy Manual Therapy: Myofascial release Myofascial Release: soft tissue right upper trap, right rhomboid, grade 1-2 PA thoracic mobs, cervical PROM rotations and side bend   Ultrasound Ultrasound Location: cervical R and LEft , right upper trap  Ultrasound Parameters: 3 mhz, 1.2 w/cm3 for increased blood flow, increased tissue extensibility x 8 min   Physical Therapy Assessment and Plan PT Assessment and Plan Clinical Impression Statement: 68 year old female referred for neck pain with med dx of cervical DDD and symnptoms consistent with dx as pateint as cervical pain and decreased AROM and muscle fascial restrictions in her cervical spine. She can benefit from skilled care to meet goals and prevent further loss of cerival ROM awhihc would impair daily activities of reading, driving  Rehab Potential: Good PT Frequency: Min 2X/week. Patient elects one time per week at this time  PT Duration: 4 weeks PT Treatment/Interventions: Therapeutic activities;Therapeutic exercise;Manual techniques;Patient/family education;Neuromuscular re-education;Modalities PT Plan: posture education, body mechanics education, manual soft tissue and jt mobs, manual and active cervical stretches, cervical AROM/PROM , progress as toelrated,  c spine isometrics , ultrasound or or IFC     Goals Home Exercise Program Pt/caregiver will Perform Home Exercise Program: For increased ROM;For increased strengthening PT Goal: Perform Home Exercise Program - Progress: Goal set today PT Short Term Goals Time to Complete Short  Term Goals: 4 weeks PT Short Term Goal 1: Patient to report pain reduced with average pain levels less than 4/10  PT Short Term Goal 2: patient to improve B side bending AROM to 2o  degrees  PT Short Term Goal 3: Improve B cervical rotations to 55 degrees for decreased difficulty with driving   Problem List Patient Active Problem List   Diagnosis Date Noted  . Neck pain, chronic 01/13/2014  . Baker's cyst of knee 12/09/2013  . Stiffness of joint, not elsewhere classified, other specified site 03/02/2013  . Weakness of neck 03/02/2013    PT - End of Session Activity Tolerance: Patient tolerated treatment well General Behavior During Therapy: WFL for tasks assessed/performed PT Plan of Care PT Patient Instructions: heat use at home, cervical AROM rotations with end range overpressure prn  Consulted and Agree with Plan of Care: Patient  GP Functional Assessment Tool Used: FOTO score 42  other PT primary limitation  Current status  G 8990 CK, goal status [id number] goal status CJ   Treyon Wymore 01/13/2014, 2:16 PM  Physician Documentation Your signature is required to indicate approval of the treatment plan as stated above.  Please sign and either send electronically or make a copy of this report for your files and return this physician signed original.   Please mark one 1.__approve of plan  2. ___approve of plan with the following conditions.   ______________________________                                                          _____________________ Physician Signature                                                                                                             Date

## 2014-01-18 ENCOUNTER — Ambulatory Visit (HOSPITAL_COMMUNITY)
Admission: RE | Admit: 2014-01-18 | Discharge: 2014-01-18 | Disposition: A | Discharge status: Home / Self Care | Payer: Medicare Other | Source: Ambulatory Visit | Attending: Internal Medicine | Admitting: Internal Medicine

## 2014-01-18 ENCOUNTER — Ambulatory Visit (HOSPITAL_COMMUNITY): Payer: Medicare Other

## 2014-01-18 DIAGNOSIS — M542 Cervicalgia: Principal | ICD-10-CM

## 2014-01-18 DIAGNOSIS — IMO0001 Reserved for inherently not codable concepts without codable children: Secondary | ICD-10-CM | POA: Diagnosis not present

## 2014-01-18 DIAGNOSIS — G8929 Other chronic pain: Secondary | ICD-10-CM

## 2014-01-18 NOTE — Progress Notes (Signed)
Physical Therapy Treatment Patient Details  Name: [name] MRN: [id number] Date of Birth: [birth year]  Today's Date: 01/18/2014 Time: 3267-1245 PT Time Calculation (min): 40 min Charge Self care [phone]), Manual 1403-1420, TE [phone], Laser therapy no charge  Visit#: 2 of 8  Re-eval: 02/12/14 Assessment Diagnosis: neck pain  Next MD Visit: Dr. Noemi Chapel  Prior Therapy: yes   Authorization: BCBS  35 co pay - patient requests 1 x week  per co pay   Authorization Time Period:    Authorization Visit#: 2 of 8   Subjective: Symptoms/Limitations Symptoms: Pt stated she had no releif with [place] last session, reports having laser therapy before with positive results.  Pain scale 5/10 Bil shoulders and neck from base of skull down to shoulders. Pain Assessment Currently in Pain?: Yes Pain Score: 5  Pain Location: Neck Pain Orientation: Right;Left  Objective  Exercise/Treatments Stretches Upper Trapezius Stretch: 3 reps;30 seconds Seated Exercises Neck Retraction: 10 reps;5 secs Cervical Rotation: Both;10 reps Lateral Flexion: Both;10 reps  Manual Therapy Manual Therapy: Massage Massage: Soft tissue massage to Bil Upper and middle trapezius,  scalenes, Levator scapula and Rhomboids 1303-1320 Other Manual Therapy: Laser relaxation of musculature 4 time each side of neck: 1:03 @ 45.0 Joules  Physical Therapy Assessment and Plan PT Assessment and Plan Clinical Impression Statement: Changed modality to laser therapy following discussion with pt. about results from [place] and positive results from laser therapy.  Pt educated on importance of proper posture to reduce cervical and shoulder pain.  Session focus on modalities and manual techniques to reduce pain and education on HEP exercises to improve cervical ROM.  Pt able to demonstrate appropriate technique with all exercises.  Noted multiple spasms Upper and mid trapezius and levator scapular, able to reduce wtih MFR and soft tissue  massage.  Pt encouraged to stay hydrated to reduce risk of headaches following manual.  Pain scale 3/10 at end of session with improved cervical rotation. PT Plan: posture education, body mechanics education, manual soft tissue and jt mobs, manual and active cervical stretches, cervical AROM/PROM , progress as toelrated, c spine isometrics , ultrasound or or IFC.  F/u on Laser therapy.      Goals Home Exercise Program Pt/caregiver will Perform Home Exercise Program: For increased ROM;For increased strengthening PT Short Term Goals Time to Complete Short Term Goals: 4 weeks PT Short Term Goal 1: Patient to report pain reduced with average pain levels less than 4/10  PT Short Term Goal 1 - Progress: Progressing toward goal PT Short Term Goal 2: patient to improve B side bending AROM to 2o  degrees  PT Short Term Goal 2 - Progress: Progressing toward goal PT Short Term Goal 3: Improve B cervical rotations to 55 degrees for decreased difficulty with driving  PT Short Term Goal 3 - Progress: Progressing toward goal  Problem List Patient Active Problem List   Diagnosis Date Noted  . Neck pain, chronic 01/13/2014  . Baker's cyst of knee 12/09/2013  . Stiffness of joint, not elsewhere classified, other specified site 03/02/2013  . Weakness of neck 03/02/2013    PT - End of Session Activity Tolerance: Patient tolerated treatment well General Behavior During Therapy: [facility] for tasks assessed/performed  GP    Aldona Lento 01/18/2014, 4:17 PM

## 2014-01-19 ENCOUNTER — Ambulatory Visit (INDEPENDENT_AMBULATORY_CARE_PROVIDER_SITE_OTHER): Payer: Medicare Other | Admitting: Orthopedic Surgery

## 2014-01-19 VITALS — BP 120/82 | Ht 65.5 in | Wt 198.0 lb

## 2014-01-19 DIAGNOSIS — M179 Osteoarthritis of knee, unspecified: Secondary | ICD-10-CM

## 2014-01-19 DIAGNOSIS — M1710 Unilateral primary osteoarthritis, unspecified knee: Secondary | ICD-10-CM

## 2014-01-19 DIAGNOSIS — M7120 Synovial cyst of popliteal space [Baker], unspecified knee: Secondary | ICD-10-CM

## 2014-01-19 DIAGNOSIS — M25562 Pain in left knee: Secondary | ICD-10-CM

## 2014-01-19 DIAGNOSIS — M25569 Pain in unspecified knee: Secondary | ICD-10-CM

## 2014-01-19 DIAGNOSIS — IMO0002 Reserved for concepts with insufficient information to code with codable children: Secondary | ICD-10-CM

## 2014-01-19 NOTE — Progress Notes (Signed)
Patient ID: [name], female   DOB: February 18, 1946, 68 y.o.   MRN: [id number]  Chief Complaint  Patient presents with  . Follow-up    2 week left knee s/p injection   BP 120/82  Ht 5' 5.5" (1.664 m)  Wt 198 lb (89.812 kg)  BMI 32.44 kg/m2  Encounter Diagnoses  Name Primary?  . Left knee pain Yes  . Baker's cyst of knee   . Arthritis of knee, degenerative     Followup visit status post injection left knee patient reports no symptoms in the left  She has full range of motion no swelling no tenderness or pain  Followup as needed

## 2014-01-20 ENCOUNTER — Ambulatory Visit (HOSPITAL_COMMUNITY): Payer: Medicare Other | Admitting: Physical Therapy

## 2014-01-24 ENCOUNTER — Ambulatory Visit (HOSPITAL_COMMUNITY)
Admission: RE | Admit: 2014-01-24 | Discharge: 2014-01-24 | Disposition: A | Discharge status: Home / Self Care | Payer: Medicare Other | Source: Ambulatory Visit | Attending: Orthopedic Surgery | Admitting: Orthopedic Surgery

## 2014-01-24 DIAGNOSIS — IMO0001 Reserved for inherently not codable concepts without codable children: Secondary | ICD-10-CM | POA: Diagnosis not present

## 2014-01-24 DIAGNOSIS — M542 Cervicalgia: Principal | ICD-10-CM

## 2014-01-24 DIAGNOSIS — G8929 Other chronic pain: Secondary | ICD-10-CM

## 2014-01-24 NOTE — Progress Notes (Signed)
Physical Therapy Treatment Patient Details  Name: [name] MRN: [id number] Date of Birth: 10/06/1946  Today's Date: 01/24/2014 Time: 1350-1440 PT Time Calculation (min): 50 min Charge: [phone], Laser no charge 1410-1420, Manual [phone]  Visit#: 3 of 8  Re-eval: 02/12/14 Assessment Diagnosis: neck pain  Next MD Visit: Dr. Noemi Chapel  Prior Therapy: yes   Authorization: BCBS  35 co pay - patient requests 1 x week  per co pay   Authorization Time Period:    Authorization Visit#: 3 of 8   Subjective: Symptoms/Limitations Symptoms: Pt reported positive results following change to laser therapy last session.  Stated increase ease with cervical rotation while driving.  Still has spasms Bil sides of neck.   Pain Assessment Currently in Pain?: Yes Pain Score: 3  Pain Location: Neck Pain Orientation: Right;Left  Objective:  Exercise/Treatments Stretches Upper Trapezius Stretch: 3 reps;30 seconds Seated Exercises Neck Retraction: 10 reps;5 secs Cervical Rotation: Both;10 reps Lateral Flexion: Both;10 reps W Back: 10 reps Postural Training: Posture awareness x 5 minutes with teachback method for proper landmarks Other Seated Exercise: cervical flexion/extension 10x Prone Exercises Other Prone Exercise: POE cervical rotation  Manual Therapy Manual Therapy: Massage Massage: Prone:  Soft tissue massage to Bil Upper and middle trapezius, scalenes, Levator scapula and Rhomboids Other Manual Therapy: Laser relaxation of musculature 4 time each side of neck: 1:03 @ 45.0 Joules  Physical Therapy Assessment and Plan PT Assessment and Plan Clinical Impression Statement: Continued with laser therapy Bil neck/shoulder musculature for musculature relaxation.  Manual focus on Rt>Lt cervical, upper and middle traps, and scapular region to reduce muliple spasms.  Ended session with POE with improved cerivcal rotation, pt continues to complain of pain Rt mid traps/levator scapular with  rotaiton. PT Plan: posture education, body mechanics education, manual soft tissue and jt mobs, manual and active cervical stretches, cervical AROM/PROM , progress as toelrated, c spine isometrics , ultrasound or or IFC.  F/u on Laser therapy.      Goals Home Exercise Program Pt/caregiver will Perform Home Exercise Program: For increased ROM;For increased strengthening PT Short Term Goals Time to Complete Short Term Goals: 4 weeks PT Short Term Goal 1: Patient to report pain reduced with average pain levels less than 4/10  PT Short Term Goal 2: patient to improve B side bending AROM to 2o  degrees  PT Short Term Goal 2 - Progress: Progressing toward goal PT Short Term Goal 3: Improve B cervical rotations to 55 degrees for decreased difficulty with driving  PT Short Term Goal 3 - Progress: Progressing toward goal  Problem List Patient Active Problem List   Diagnosis Date Noted  . Neck pain, chronic 01/13/2014  . Baker's cyst of knee 12/09/2013  . Stiffness of joint, not elsewhere classified, other specified site 03/02/2013  . Weakness of neck 03/02/2013    PT - End of Session Activity Tolerance: Patient tolerated treatment well General Behavior During Therapy: [facility] for tasks assessed/performed  GP    Aldona Lento 01/24/2014, 3:02 PM

## 2014-01-31 ENCOUNTER — Ambulatory Visit (HOSPITAL_COMMUNITY)
Admission: RE | Admit: 2014-01-31 | Discharge: 2014-01-31 | Disposition: A | Discharge status: Home / Self Care | Payer: Medicare Other | Source: Ambulatory Visit | Attending: Internal Medicine | Admitting: Internal Medicine

## 2014-01-31 DIAGNOSIS — G8929 Other chronic pain: Secondary | ICD-10-CM

## 2014-01-31 DIAGNOSIS — IMO0001 Reserved for inherently not codable concepts without codable children: Secondary | ICD-10-CM | POA: Diagnosis not present

## 2014-01-31 DIAGNOSIS — M542 Cervicalgia: Principal | ICD-10-CM

## 2014-01-31 NOTE — Progress Notes (Signed)
Physical Therapy Treatment Patient Details  Name: [name] MRN: [id number] Date of Birth: 09-Apr-1946  Today's Date: 01/31/2014 Time: 5732-2025 PT Time Calculation (min): 43 min Charge : TE 4270-6237, Laser no charge 1448-1580, Manual 6283-1517  Visit#: 4 of 8  Re-eval: 02/12/14 Assessment Diagnosis: neck pain  Next MD Visit: Dr. Noemi Chapel 02/10/2014 Prior Therapy: yes   Authorization: BCBS  35 co pay - patient requests 1 x week  per co pay   Authorization Time Period:    Authorization Visit#: 4 of 8   Subjective: Symptoms/Limitations Symptoms: Pt stated pain reduced last session, pain scale 3/10.  Pt stated driving is getting easier to turn head. Pain Assessment Currently in Pain?: Yes Pain Score: 3  Pain Location: Neck Pain Orientation: Posterior;Left  Objective:  Exercise/Treatments Stretches Upper Trapezius Stretch: 3 reps;30 seconds Corner Stretch: 2 reps;30 seconds Seated Exercises Neck Retraction: 10 reps;5 secs Cervical Rotation: Both;10 reps Lateral Flexion: Both;10 reps W Back: 10 reps Shoulder Rolls: Backwards;10 reps Other Seated Exercise: cervical flexion/extension 10x Manual Therapy Manual Therapy: Massage Massage: Seated: Soft tissue massage to Bil Upper and middle trapezius, scalenes and Rhomboids Other Manual Therapy: Laser relaxation of musculature 3 time each side of neck: 1:03 @ 45.0 Joules  Physical Therapy Assessment and Plan PT Assessment and Plan Clinical Impression Statement: Pt progressing well with cervical ROM, less cueing required to reduce compensation wtih cervical rotation.  Pt reported new Rt pectoralis major region pain with postural exercises, added corner stretch to reduce pectoralis tightness and pain.  Continued with laser therapy and manual soft tissue massage to upper trapezius and cervical region, able to reduce spasms by 60% with relief stated at the end of sessiopn with improve cervical movements.   PT Plan: Continue with  current POC to improve cervical ROM stretches,,posture education, body mechanics, manual soft tissue and joint mobs and laser therapy as needed.      Goals Home Exercise Program Pt/caregiver will Perform Home Exercise Program: For increased ROM;For increased strengthening PT Short Term Goals Time to Complete Short Term Goals: 4 weeks PT Short Term Goal 1: Patient to report pain reduced with average pain levels less than 4/10  PT Short Term Goal 1 - Progress: Progressing toward goal PT Short Term Goal 2: patient to improve B side bending AROM to 2o  degrees  PT Short Term Goal 2 - Progress: Progressing toward goal PT Short Term Goal 3: Improve B cervical rotations to 55 degrees for decreased difficulty with driving  PT Short Term Goal 3 - Progress: Progressing toward goal  Problem List Patient Active Problem List   Diagnosis Date Noted  . Neck pain, chronic 01/13/2014  . Baker's cyst of knee 12/09/2013  . Stiffness of joint, not elsewhere classified, other specified site 03/02/2013  . Weakness of neck 03/02/2013    PT - End of Session Activity Tolerance: Patient tolerated treatment well General Behavior During Therapy: [facility] for tasks assessed/performed  GP    Aldona Lento 01/31/2014, 4:32 PM

## 2014-02-07 ENCOUNTER — Ambulatory Visit (HOSPITAL_COMMUNITY)
Admission: RE | Admit: 2014-02-07 | Discharge: 2014-02-07 | Disposition: A | Discharge status: Home / Self Care | Payer: Medicare Other | Source: Ambulatory Visit | Attending: Orthopedic Surgery | Admitting: Orthopedic Surgery

## 2014-02-07 DIAGNOSIS — IMO0001 Reserved for inherently not codable concepts without codable children: Secondary | ICD-10-CM | POA: Diagnosis not present

## 2014-02-07 DIAGNOSIS — M542 Cervicalgia: Principal | ICD-10-CM

## 2014-02-07 DIAGNOSIS — G8929 Other chronic pain: Secondary | ICD-10-CM

## 2014-02-07 NOTE — Progress Notes (Signed)
Physical Therapy Progress note /Return to Dr   Patient Details  Name: [name] MRN: [id number] Date of Birth: [birth year]  Today's Date: 02/07/2014 Time: 3790-2409 PT Time Calculation (min): 43 min Charge MHP 1435-1440, TE 1440-1500, Laser no charge during TE ROM exercises 1450-1500, Manual 1500-1515, ROM measurement [phone]             Visit#: 5 of 8  Re-eval: 02/12/14 (MD 02/10/2014) Assessment Diagnosis: neck pain  Next MD Visit: Dr. Noemi Chapel 02/10/2014 Prior Therapy: yes   Authorization: BCBS  35 co pay - patient requests 1 x week  per co pay     Authorization Time Period:    Authorization Visit#: 5 of 8   Subjective Symptoms/Limitations Symptoms: Pt stated neck feels very tight, reports new Rt shoulder blade pain scale 5/10. Pain Assessment Currently in Pain?: Yes Pain Score: 5  Pain Location: Neck Pain Orientation: Posterior Multiple Pain Sites: Yes   Assessment Cervical AROM Cervical - Right Side Bend: 15 (was 10) Cervical - Left Side Bend: 33 (was 15) Cervical - Right Rotation: 53 (was 50) Cervical - Left Rotation: 62 (was 35)  Exercise/Treatments Stretches Upper Trapezius Stretch: 3 reps;30 seconds Levator Stretch: 2 reps;30 seconds Seated Exercises Neck Retraction: 10 reps;5 secs Cervical Rotation: Both;10 reps Lateral Flexion: Both;10 reps W Back: 10 reps Other Seated Exercise: cervical flexion/extension 10x Prone Exercises Rows: 5 reps  Manual Therapy Myofascial Release: Prone soft tissue to right upper and mid traps, levator scapula and rhomboids Other Manual Therapy: Laser relaxation of musculature 3 time each side of neck: 1:03 @ 45.0 Joules  Physical Therapy Assessment and Plan PT Assessment and Plan Clinical Impression Statement: Progress note complete prior MD apt 02/10/2014 with the following findings:  Pt independent with HEP and able to demonstrate appropraite form and technique with all exercises.  Pt stated pain scale average 4/10.  Pt  with improved cervical rotation and sidebending but continues to be more restricted wtih Rt SB and rotation.  This session noted multiple spams to Rt levator scapula, Bil upper and mid trapezius.  Manual techniques complete to reduce spams and pain.   PT Plan: F/U with MD.  Continue with current POC to improve cervical ROM stretches,,posture education, body mechanics, manual soft tissue and joint mobs and laser therapy as needed.      Goals Home Exercise Program Pt/caregiver will Perform Home Exercise Program: For increased ROM;For increased strengthening PT Goal: Perform Home Exercise Program - Progress: Met PT Short Term Goals Time to Complete Short Term Goals: 4 weeks PT Short Term Goal 1: Patient to report pain reduced with average pain levels less than 4/10  PT Short Term Goal 1 - Progress: Met (reports highest pain average 3-4/10) PT Short Term Goal 2: patient to improve B side bending AROM to 2o  degrees  PT Short Term Goal 2 - Progress: Partly met (Rt SB 15 degrees, Lt 33 degrees) PT Short Term Goal 3: Improve B cervical rotations to 55 degrees for decreased difficulty with driving  PT Short Term Goal 3 - Progress: Progressing toward goal  Problem List Patient Active Problem List   Diagnosis Date Noted  . Neck pain, chronic 01/13/2014  . Baker's cyst of knee 12/09/2013  . Stiffness of joint, not elsewhere classified, other specified site 03/02/2013  . Weakness of neck 03/02/2013    PT - End of Session Activity Tolerance: Patient tolerated treatment well General Behavior During Therapy: [facility] for tasks assessed/performed  GP    Ihor Austin  Denice Paradise 02/07/2014, 4:20 PM  Physician Documentation Your signature is required to indicate approval of the treatment plan as stated above.  Please sign and either send electronically or make a copy of this report for your files and return this physician signed original.   Please mark one 1.__approve of plan  2. ___approve of plan with the  following conditions.   ______________________________                                                          _____________________ Physician Signature                                                                                                             Date

## 2014-02-08 NOTE — Addendum Note (Signed)
Encounter addended by: Sarajane Marek, PT on: 02/08/2014 12:11 PM<BR>     Documentation filed: Clinical Notes

## 2014-02-08 NOTE — Addendum Note (Signed)
Encounter addended by: Sarajane Marek, PT on: 02/08/2014 12:12 PM<BR>     Documentation filed: Flowsheet VN

## 2014-02-10 ENCOUNTER — Other Ambulatory Visit: Payer: Self-pay | Admitting: Physical Medicine and Rehabilitation

## 2014-02-10 DIAGNOSIS — M542 Cervicalgia: Secondary | ICD-10-CM

## 2014-02-17 ENCOUNTER — Ambulatory Visit
Admission: RE | Admit: 2014-02-17 | Discharge: 2014-02-17 | Disposition: A | Discharge status: Home / Self Care | Payer: Medicare Other | Source: Ambulatory Visit | Attending: Physical Medicine and Rehabilitation | Admitting: Physical Medicine and Rehabilitation

## 2014-02-17 DIAGNOSIS — M542 Cervicalgia: Secondary | ICD-10-CM

## 2014-04-06 NOTE — Progress Notes (Signed)
  Patient Details  Name: [name] MRN: [id number] Date of Birth: [birth year]  Today's Date: 04/06/2014 Pt has not returned since 02/07/2014 she was seen for 5/8 visits. Pt will be discharged.  Leeroy Cha 04/06/2014, 2:15 PM

## 2014-04-06 NOTE — Addendum Note (Signed)
Encounter addended by: Leeroy Cha, PT on: 04/06/2014  2:18 PM<BR>     Documentation filed: Episodes

## 2014-04-06 NOTE — Addendum Note (Signed)
Encounter addended by: Leeroy Cha, PT on: 04/06/2014  2:16 PM<BR>     Documentation filed: Notes Section, Episodes

## 2014-06-02 ENCOUNTER — Telehealth: Payer: Self-pay | Admitting: Orthopedic Surgery

## 2014-06-02 ENCOUNTER — Other Ambulatory Visit: Payer: Self-pay | Admitting: Orthopedic Surgery

## 2014-06-02 MED ORDER — HYDROCODONE-ACETAMINOPHEN 5-325 MG PO TABS: 1.0000 | ORAL_TABLET | Freq: Four times a day (QID) | ORAL | Status: DC | PRN

## 2014-06-02 NOTE — Telephone Encounter (Signed)
[name] wanted an appointment this week because the back of her knee is puffy and painful for about a week now. Said she thinks the bakers cyst has returned.  No available appointments this week or first of next week, so she asked if you could suggest any thing she can do to help the pain. Told her we will call if a cancellation comes in.  Please advise.

## 2014-06-03 NOTE — Telephone Encounter (Signed)
Prescription written by Dr. Aline Brochure, picked up by the patient

## 2014-06-28 ENCOUNTER — Ambulatory Visit: Payer: Medicare Other | Admitting: Orthopedic Surgery

## 2014-07-05 ENCOUNTER — Ambulatory Visit (INDEPENDENT_AMBULATORY_CARE_PROVIDER_SITE_OTHER): Payer: Medicare Other | Admitting: Orthopedic Surgery

## 2014-07-05 DIAGNOSIS — M1712 Unilateral primary osteoarthritis, left knee: Secondary | ICD-10-CM

## 2014-07-05 DIAGNOSIS — M7120 Synovial cyst of popliteal space [Baker], unspecified knee: Secondary | ICD-10-CM

## 2014-07-05 DIAGNOSIS — M1710 Unilateral primary osteoarthritis, unspecified knee: Secondary | ICD-10-CM

## 2014-07-05 DIAGNOSIS — M25569 Pain in unspecified knee: Secondary | ICD-10-CM

## 2014-07-05 DIAGNOSIS — M7122 Synovial cyst of popliteal space [Baker], left knee: Secondary | ICD-10-CM

## 2014-07-05 DIAGNOSIS — M25562 Pain in left knee: Secondary | ICD-10-CM

## 2014-07-05 NOTE — Progress Notes (Signed)
Followup visit persistent pain swelling left knee status post one injection with good result  Patient would like a second injection. Continues to have catching locking giving way symptoms we discussed possible knee surgery. She has severe claustrophobia so that would be an issue for using casting but it is not an issue at this point for her knee  Repeat injection left knee  Knee  Injection Procedure Note  Pre-operative Diagnosis: left knee oa  Post-operative Diagnosis: same  Indications: pain  Anesthesia: ethyl chloride   Procedure Details   Verbal consent was obtained for the procedure. Time out was completed.The joint was prepped with alcohol, followed by  Ethyl chloride spray and A 20 gauge needle was inserted into the knee via lateral approach; 87ml 1% lidocaine and 1 ml of depomedrol  was then injected into the joint . The needle was removed and the area cleansed and dressed.  Complications:  None; patient tolerated the procedure well.

## 2014-07-05 NOTE — Patient Instructions (Signed)
Joint Injection  Care After  Refer to this sheet in the next few days. These instructions provide you with information on caring for yourself after you have had a joint injection. Your caregiver also may give you more specific instructions. Your treatment has been planned according to current medical practices, but problems sometimes occur. Call your caregiver if you have any problems or questions after your procedure.  After any type of joint injection, it is not uncommon to experience:  · Soreness, swelling, or bruising around the injection site.  · Mild numbness, tingling, or weakness around the injection site caused by the numbing medicine used before or with the injection.  It also is possible to experience the following effects associated with the specific agent after injection:  · Iodine-based contrast agents:  ¨ Allergic reaction (itching, hives, widespread redness, and swelling beyond the injection site).  · Corticosteroids (These effects are rare.):  ¨ Allergic reaction.  ¨ Increased blood sugar levels (If you have diabetes and you notice that your blood sugar levels have increased, notify your caregiver).  ¨ Increased blood pressure levels.  ¨ Mood swings.  · Hyaluronic acid in the use of viscosupplementation.  ¨ Temporary heat or redness.  ¨ Temporary rash and itching.  ¨ Increased fluid accumulation in the injected joint.  These effects all should resolve within a day after your procedure.   HOME CARE INSTRUCTIONS  · Limit yourself to light activity the day of your procedure. Avoid lifting heavy objects, bending, stooping, or twisting.  · Take prescription or over-the-counter pain medication as directed by your caregiver.  · You may apply ice to your injection site to reduce pain and swelling the day of your procedure. Ice may be applied 03-04 times:  ¨ Put ice in a plastic bag.  ¨ Place a towel between your skin and the bag.  ¨ Leave the ice on for no longer than 15-20 minutes each time.  SEEK  IMMEDIATE MEDICAL CARE IF:   · Pain and swelling get worse rather than better or extend beyond the injection site.  · Numbness does not go away.  · Blood or fluid continues to leak from the injection site.  · You have chest pain.  · You have swelling of your face or tongue.  · You have trouble breathing or you become dizzy.  · You develop a fever, chills, or severe tenderness at the injection site that last longer than 1 day.  MAKE SURE YOU:  · Understand these instructions.  · Watch your condition.  · Get help right away if you are not doing well or if you get worse.  Document Released: 07/11/2011 Document Revised: 01/20/2012 Document Reviewed: 07/11/2011  ExitCare® Patient Information ©2015 [employer]. This information is not intended to replace advice given to you by your health care provider. Make sure you discuss any questions you have with your health care provider.

## 2014-07-11 ENCOUNTER — Ambulatory Visit: Payer: Medicare Other | Admitting: Orthopedic Surgery

## 2014-09-07 DIAGNOSIS — D126 Benign neoplasm of colon, unspecified: Secondary | ICD-10-CM | POA: Insufficient documentation

## 2014-09-07 DIAGNOSIS — J45909 Unspecified asthma, uncomplicated: Secondary | ICD-10-CM | POA: Insufficient documentation

## 2014-09-07 DIAGNOSIS — G453 Amaurosis fugax: Secondary | ICD-10-CM | POA: Insufficient documentation

## 2014-09-07 DIAGNOSIS — L500 Allergic urticaria: Secondary | ICD-10-CM | POA: Insufficient documentation

## 2014-09-07 DIAGNOSIS — N649 Disorder of breast, unspecified: Secondary | ICD-10-CM | POA: Insufficient documentation

## 2014-09-07 DIAGNOSIS — Z8709 Personal history of other diseases of the respiratory system: Secondary | ICD-10-CM | POA: Insufficient documentation

## 2014-09-07 DIAGNOSIS — H9319 Tinnitus, unspecified ear: Secondary | ICD-10-CM | POA: Insufficient documentation

## 2014-09-07 DIAGNOSIS — J309 Allergic rhinitis, unspecified: Secondary | ICD-10-CM | POA: Insufficient documentation

## 2014-09-07 DIAGNOSIS — K219 Gastro-esophageal reflux disease without esophagitis: Secondary | ICD-10-CM | POA: Insufficient documentation

## 2014-09-07 DIAGNOSIS — H903 Sensorineural hearing loss, bilateral: Secondary | ICD-10-CM | POA: Insufficient documentation

## 2014-09-07 DIAGNOSIS — R928 Other abnormal and inconclusive findings on diagnostic imaging of breast: Secondary | ICD-10-CM | POA: Insufficient documentation

## 2015-02-27 ENCOUNTER — Encounter: Payer: Self-pay | Attending: Internal Medicine | Admitting: Nutrition

## 2015-02-27 DIAGNOSIS — E782 Mixed hyperlipidemia: Secondary | ICD-10-CM

## 2015-02-27 NOTE — Patient Instructions (Signed)
Goals:  1. Follow The Plate Method as discussed. 2. Increase fresh fruits and vegetables. 3. Eat three meals per day and do not skip lunch. 4. Cut out processed and fried foods, sweets, cakes, cookies desserts and other high fat desserts. 5. Exercise 30 minutes 5 days per week. 6. Lose 1 lb per week til next visit. 7. Get Lipids down 10% or more  in 6 months.

## 2015-02-27 NOTE — Progress Notes (Signed)
  Medical Nutrition Therapy:  Appt start time: 0930 end time:  1045  Assessment:  Primary concerns today: Hyperlipidemia.  History of Diverticulitis. Chronic Constipation. LIves with her husband.  Her husband does the shopping a lot and they were eating take out a lot.Recently has changed to cooking more and controlling her food choices.  TCHOL 292 mg/dl, TG 263 mg/dl, VLDL 53 mg/dl and LDL 187 mg/dl.  Vit D is low at 28.7 . Takes Vit D 3 daily. Doesn't get a lot of exercise due to foot problems but willing to work on getting more to lower her cholesterol levels. She is familiar with signs and symptoms of a heart attack and when to call 911. She is willing to work on her diet and exercise to help improve lipids. Admits to eating a lot of girl scout cookies previously.  Doesn't know if she has been screened for DM.   Diet is higher in fat, low if fiber and fresh fruits and vegetables. Only eats 2 meals per day; skips lunch. Doesn't like to drink water. Has recently given up Diet Coke and now drinking 50/50 tea, which she is working on cutting out too.   Preferred Learning Style:  Auditory  Visual  Hands on   Learning Readiness:   Ready  Change in progress  MEDICATIONS: See list   DIETARY INTAKE:  24-hr recall:  B ( AM): [place] crisp and rice krispies cereal with 2% milk, coffee-splenda and half and half  Snk ( AM): none  L ( PM): nothing. 50/50 16 oz Snk ( PM): none D ( PM): Egg salad sandwich on whole wheat bread, potato chips, sweet pickles, 50/50 tea Snk ( PM): none Beverages: 50/50 tea, water,   Usual physical activity: Walking 30 minutes twice a day.  Estimated energy needs: 1500 calories 170 g carbohydrates 112 g protein 42 g fat  Progress Towards Goal(s):  In progress.   Nutritional Diagnosis:  NB-1.1 Food and nutrition-related knowledge deficit As related to Hyperlipidemia.  As evidenced by TCHOL 292, TG 263 and LDL 187 mg/l.Marland Kitchen    Intervention:  Nutrition  counseling done on cardiac risk factors for a heart attack and stroke, High fiber, low fat low sodium diet using the Plate Method, portion sizes, benefits of exercise and need for avoiding saturated fats. Stressed the need for medication management of Hyperlipidemia to reduce risk of heart attack or stroke. She verbalized understanding.   Goals:  1. Follow The Plate Method as discussed. 2. Increase fresh fruits and vegetables. 3. Eat three meals per day and do not skip lunch. 4. Cut out processed and fried foods, sweets, cakes, cookies desserts and other high fat desserts. 5. Exercise 30 minutes 5 days per week. 6. Lose 1 lb per week til next visit. 7. Get Lipids down 10% or more  in 6 months.  Teaching Method Utilized:  Visual Auditory Hands on  Handouts given during visit include:  The Plate MEthod.       Hyperlipidemia Nutrition Therapy          Barriers to learning/adherence to lifestyle change: None  Demonstrated degree of understanding via:  Teach Back   Monitoring/Evaluation:  Dietary intake, exercise, meal planning, SBG, and body weight in 1 month(s).

## 2015-04-12 ENCOUNTER — Encounter: Payer: Self-pay | Attending: Internal Medicine | Admitting: Nutrition

## 2015-04-12 VITALS — Ht 65.0 in | Wt 198.8 lb

## 2015-04-12 DIAGNOSIS — E785 Hyperlipidemia, unspecified: Secondary | ICD-10-CM

## 2015-04-12 DIAGNOSIS — E669 Obesity, unspecified: Secondary | ICD-10-CM

## 2015-04-12 NOTE — Progress Notes (Signed)
  Medical Nutrition Therapy:  Appt start time: 0930 end time:  1045  Assessment:  Primary concerns today: Hyperlipidemia. Lost 7 lbs!!. Has cut out all diet sodas!! Drinkingly only water now! . Has cut out all snacks and processed junk foods. Eating lots more chicken baked,grilled foods and fresh vegetables. Drinking more water and eating salads. Had lab work done yesterday. Has cut out a lot of fried foods and processed foods. Eating much healthier. Has cut down on eating out once a week now instead of 7 nights a week. Feels much better. Not as tired. Sleeping much better and more energy. Constipation is better but still an issue. Chronic constipation since she was small. Eating more fiber and drinking a lot more water and exercising now.  Had labs done a day or two ago but doesn't have results yet. Will call Dr. Ria Comment office for results.   Preferred Learning Style:  Auditory  Visual  Hands on   Learning Readiness:   Ready  Change in progress  MEDICATIONS: See list   DIETARY INTAKE:  24-hr recall:  B ( AM): Eat Kashi sweet potato cereal with some berries using 2% milk and will go to  Snk ( AM): none  L ( PM): Toss salad: lettuce,  Carrots, tomatoes, ham from deli, croutons, [place] dressing. Snk ( PM): none D ( PM): Grilled chicken, zucchini, squash and onion and 1/2 sweet potatoes.  Snk ( PM): none Beverages: 50/50 tea, water,   Usual physical activity: Walking 30 minutes twice a day.  Estimated energy needs: 1500 calories 170 g carbohydrates 112 g protein 42 g fat  Progress Towards Goal(s):  In progress.   Nutritional Diagnosis:  NB-1.1 Food and nutrition-related knowledge deficit As related to Hyperlipidemia.  As evidenced by TCHOL 292, TG 263 and LDL 187 mg/l.Marland Kitchen    Intervention:  Nutrition counseling done on cardiac risk factors for a heart attack and stroke, High fiber, low fat low sodium diet using the Plate Method, portion sizes, benefits of exercise and  need for avoiding saturated fats. Stressed the need for medication management of Hyperlipidemia to reduce risk of heart attack or stroke. She verbalized understanding. High Fiber foods. Use of Miralax for constipation. Talk to PCP. Goals: Keep up the [place]!!  1. Follow The Plate Method as discussed. 2. Increase fresh fruits and vegetables. 3. Continue to increase exercise towards 45 minutes 4 days per week. 4. Continue drinking water only. Increase high fiber foods when possible 6. Lose 1 lb per week til next visit. 7. Get Lipids down 10% or more  in 6 months.  Teaching Method Utilized:  Visual Auditory Hands on  Handouts given during visit include:  The Plate MEthod.       Hyperlipidemia Nutrition Therapy        HIgh Fiber diet.  Barriers to learning/adherence to lifestyle change: None  Demonstrated degree of understanding via:  Teach Back   Monitoring/Evaluation:  Dietary intake, exercise, meal planning, SBG, and body weight in 1 month(s). Reevaluate lipids as her diet has changed dramatically and she is now exercising with weight loss. Needs evaluation for chronic constipation.

## 2015-04-13 NOTE — Progress Notes (Signed)
Labs obtained from Dr. Beverlee Nims office today: Lipid Profile:  TCHOL 235  Mg/dl, TG 219 mg/dl, VLDL 44 mg/dl and LDL 143 mg/dl

## 2015-04-13 NOTE — Progress Notes (Signed)
Lipid profile obtained from Dr. Willey Blade' office. TCHOL 235, down from 292  Mg/dl. TG 263 down to 219  Mg/dl LDL 187 down to 143 mg/dl. This is an average of 17-24% change in her numbers. Excellendtprogress in lipid profile over the last 2 months since she has really been working on changing eating habits and exercise.

## 2015-04-13 NOTE — Patient Instructions (Signed)
Goals: Keep up the [place]!!  1. Follow The Plate Method as discussed. 2. Increase fresh fruits and vegetables. 3. Continue to increase exercise towards 45 minutes 4 days per week. 4. Continue drinking water only. Increase high fiber foods when possible 6. Lose 1 lb per week til next visit. 7. Get Lipids down 10% or more  in 6 months.

## 2015-05-18 ENCOUNTER — Encounter: Payer: Self-pay | Admitting: Internal Medicine

## 2015-09-13 ENCOUNTER — Ambulatory Visit: Payer: Self-pay | Admitting: Nutrition

## 2015-09-18 ENCOUNTER — Ambulatory Visit: Payer: Self-pay | Admitting: Orthopedic Surgery

## 2015-09-25 ENCOUNTER — Encounter: Payer: Self-pay | Admitting: Nutrition

## 2015-09-25 ENCOUNTER — Encounter: Payer: Self-pay | Attending: Internal Medicine | Admitting: Nutrition

## 2015-09-25 VITALS — Ht 65.5 in | Wt 198.0 lb

## 2015-09-25 DIAGNOSIS — E669 Obesity, unspecified: Secondary | ICD-10-CM

## 2015-09-25 DIAGNOSIS — E785 Hyperlipidemia, unspecified: Secondary | ICD-10-CM

## 2015-09-25 NOTE — Progress Notes (Signed)
  Medical Nutrition Therapy:  Appt start time: 0930 end time:  1045  Assessment:  Primary concerns today: Hyperlipidemia.   She fell on vacation and hurt her left knee. Waiting on MRI results. Lipid profile obtained from Dr. Willey Blade' office. TCHOL 235, down from 292  Mg/dl. TG 263 down to 219  Mg/dl LDL 187 down to 143 mg/dl. This is an average of 17-24% change in her numbers. Excellent progress in lipid profile over the last 2 months since she has really been working on changing eating by eating Fiber one cereal and other high fiber foods and and  Previously been exercising. She doesn't want to go on Lipid lowering medication. She notes she had lost some weight prior to her knee injury. Wt is same as last visit.   Preferred Learning Style:  Auditory  Visual  Hands on   Learning Readiness:   Ready  Change in progress  MEDICATIONS: See list   DIETARY INTAKE:  24-hr recall:  Has been eating three meals per day. Focusing on high fiber foods, more fresh fruits and vegetables.  Drinking water now.  Usual physical activity: not able to exercise due to falling and injury ing her knee.  Estimated energy needs: 1500 calories 170 g carbohydrates 112 g protein 42 g fat  Progress Towards Goal(s):  In progress.   Nutritional Diagnosis:  NB-1.1 Food and nutrition-related knowledge deficit As related to Hyperlipidemia.  As evidenced by TCHOL 292, TG 263 and LDL 187 mg/l.Marland Kitchen    Intervention:  Nutrition counseling done on cardiac risk factors for a heart attack and stroke, High fiber, low fat low sodium diet using the Plate Method, portion sizes, benefits of exercise and need for avoiding saturated fats. Stressed the need for medication management of Hyperlipidemia to reduce risk of heart attack or stroke. She verbalized understanding. High Fiber foods. Use of Miralax for constipation. Talk to PCP.   Goals: Keep up the [place]!!  1. Continue your high fiber diet and the Fiber One  cereal. May consider adding GROUND flaxseed meal to foods or Chia seeds as desired for increased fiber. 2. Continue to increase fresh fruits and vegetables. 3. Continue drinking water only.   Get Lipids down  Another 5% or more  in 6 months.  Teaching Method Utilized:  Visual Auditory Hands on  Handouts given during visit include:  The Plate MEthod.       Hyperlipidemia Nutrition Therapy        HIgh Fiber diet.  Barriers to learning/adherence to lifestyle change: None  Demonstrated degree of understanding via:  Teach Back   Monitoring/Evaluation:  Dietary intake, exercise, meal planning, SBG, and body weight  PRN as needed. May consider Mega Red Omega 3 Fish Oil

## 2015-09-25 NOTE — Patient Instructions (Signed)
Goals: Keep up the [place]!!  1. Continue your high fiber diet and the Fiber One cereal. May consider adding GROUND flaxseed meal to foods or Chia seeds as desired for increased fiber. 2. Continue to increase fresh fruits and vegetables. 3. Continue drinking water only.

## 2015-10-18 ENCOUNTER — Ambulatory Visit (HOSPITAL_COMMUNITY): Payer: Medicare Other | Attending: Orthopedic Surgery | Admitting: Physical Therapy

## 2015-10-18 DIAGNOSIS — R262 Difficulty in walking, not elsewhere classified: Secondary | ICD-10-CM

## 2015-10-18 DIAGNOSIS — R2689 Other abnormalities of gait and mobility: Secondary | ICD-10-CM | POA: Insufficient documentation

## 2015-10-18 DIAGNOSIS — R29898 Other symptoms and signs involving the musculoskeletal system: Secondary | ICD-10-CM | POA: Diagnosis not present

## 2015-10-18 DIAGNOSIS — M25562 Pain in left knee: Secondary | ICD-10-CM | POA: Insufficient documentation

## 2015-10-18 NOTE — Patient Instructions (Signed)
Strengthening: Quadriceps Set    Tighten muscles on top of thighs by pushing knees down into surface. Hold ____ seconds. Repeat ____ times per set. Do ____ sets per session. Do ____ sessions per day.  http://orth.exer.us/602   Copyright  VHI. All rights reserved.  Short Arc [employer] a large can or rolled towel under leg. Straighten knee and leg. Hold ____ seconds. Repeat with other leg. Repeat ____ times. Do ____ sessions per day.  http://gt2.exer.us/366   Copyright  VHI. All rights reserved.  Bridging    Slowly raise buttocks from floor, keeping stomach tight. Repeat ____ times per set. Do ____ sets per session. Do ____ sessions per day.  http://orth.exer.us/1097   Copyright  VHI. All rights reserved.  Strengthening: Hip Abduction (Side-Lying)    Tighten muscles on front of left thigh, then lift leg ____ inches from surface, keeping knee locked.  Repeat ____ times per set. Do ____ sets per session. Do ____ sessions per day.  http://orth.exer.us/623   Copyright  VHI. All rights reserved.

## 2015-10-18 NOTE — Therapy (Signed)
[facility] [address] [place], [place], [address] Phone: [phone]   Fax:  [phone]  Physical Therapy Evaluation  Patient Details  Name: [name] MRN: [id number] Date of Birth: 09-07-1946 Referring Provider: Dr. Elsie Saas  Encounter Date: 10/18/2015      PT End of Session - 10/18/15 1503    Visit Number 1   Number of Visits 8   Date for PT Re-Evaluation 11/17/15   Authorization Type BCBS Medicare   Authorization Time Period 10/18/15-12/19/15   Authorization - Visit Number 1   Authorization - Number of Visits 10   PT Start Time [id number]   PT Stop Time 1224   PT Time Calculation (min) 39 min   Activity Tolerance Patient tolerated treatment well   Behavior During Therapy [facility] for tasks assessed/performed      Past Medical History  Diagnosis Date  . Depression     Past Surgical History  Procedure Laterality Date  . Cesarean section    . Rotator cuff repair    . Ankle fracture surgery    . Breast lumpectomy    . Appendectomy      There were no vitals filed for this visit.  Visit Diagnosis:  Left leg weakness  Left knee pain  Difficulty walking  Impairment of balance      Subjective Assessment - 10/18/15 1153    Subjective Pt reports that she had L knee arthroscopy on 11/22. Pt reports that she was stepping onto a trolley on 10/22 when she fell and hurt her knee, resulting in the surgery. She reports that she is not having many functional difficulties at this time. She is able to go up and down steps without pain, she is able to walk around the house and to the mailbox without pain. She is supposed to be using a RW, but it is hard for her to use it when she wears her tennis shoes. Pt reports that she has been ambulating much in the community. Pt reports that she thinks that her balance is pretty good, and she has not had any falls since October.    How long can you sit comfortably? no limitations   How long can  you stand comfortably? 30 minutes   How long can you walk comfortably? 45 minutes   Patient Stated Goals Improve strength   Currently in Pain? No/denies   Multiple Pain Sites No            [facility] PT Assessment - 10/18/15 0001    Assessment   Medical Diagnosis L knee arthroscopy   Referring Provider Dr. Elsie Saas   Onset Date/Surgical Date 10/03/15   Next MD Visit 11/07/15   Prior Therapy no   Precautions   Precautions None   Restrictions   Weight Bearing Restrictions No   Balance Screen   Has the patient fallen in the past 6 months Yes   How many times? 2   Has the patient had a decrease in activity level because of a fear of falling?  No   Is the patient reluctant to leave their home because of a fear of falling?  No   Home Environment   Living Environment Private residence   Living Arrangements Spouse/significant other   Type of [place] to enter   Entrance Stairs-Number of Steps 2   Entrance Stairs-Rails None   Home Layout Two level;Able to live on main level with bedroom/bathroom  Alternate Level Stairs-Number of Steps 15   Alternate Level Stairs-Rails Right;Left   Prior Function   Level of Independence Independent   Vocation Retired   Leisure Enjoys doing crafts, Haematologist   Observation/Other Assessments   Focus on Therapeutic Outcomes (FOTO)  50% limited   Functional Tests   Functional tests Single leg stance   Single Leg Stance   Comments 3" on LLE   ROM / Strength   AROM / PROM / Strength AROM;Strength   AROM   AROM Assessment Site Knee   Right/Left Knee Left   Left Knee Extension -2   Left Knee Flexion 116   Strength   Strength Assessment Site Hip;Knee   Right Hip Flexion 4+/5   Right Hip Extension 4-/5   Right Hip ABduction 4-/5   Left Hip Flexion 4+/5   Left Hip Extension 3+/5   Left Hip ABduction 3+/5   Right/Left Knee Right;Left   Right Knee Flexion 4+/5   Right Knee Extension 4+/5   Left Knee Flexion 4/5   Left  Knee Extension 4/5   Transfers   Five time sit to stand comments  11.37   Ambulation/Gait   Gait Pattern Decreased weight shift to left;Decreased stance time - left;Decreased step length - right   Gait Comments TUG: 8.47                    PT Education - 10/18/15 1503    Education provided Yes   Education Details HEP, prognosis   Person(s) Educated Patient   Methods Explanation;Handout   Comprehension Verbalized understanding;Returned demonstration          PT Short Term Goals - 10/18/15 1622    PT SHORT TERM GOAL #1   Title Pt will be independent with HEP.   Time 2   Period Weeks   Status New   PT SHORT TERM GOAL #2   Title Improve L hip extensor and abductor strenegth to 4-/5 or greater to improve ability to climb stairs reciprocally.   Time 2   Period Weeks   Status New   PT SHORT TERM GOAL #3   Title Improve balance evidenced by pt's ability to maintain SLS x 20" on LLE.   Time 2   Period Weeks   Status New           PT Long Term Goals - 10/18/15 1623    PT LONG TERM GOAL #1   Title Pt will be independent with advanced HEP for LE strengthening and balance.    Time 4   Period Weeks   Status New   PT LONG TERM GOAL #2   Title Improve LLE strength to 4+/5 or greater to improve gait mechanics and ability to climb stairs reciprocally without pain.   Time 4   Period Weeks   Status New   PT LONG TERM GOAL #3   Title Increase L knee ROM to 0-120 degrees to normalize gait mechanics and decrease fall risk.   Time 4   Period Weeks   Status New   PT LONG TERM GOAL #4   Title Improve balance as evidenced by ability to maintain SLS on LLE x 45" or more.    Time 4   Period Weeks   Status New   PT LONG TERM GOAL #5   Title Pt will ambulate 1,000 feet with equal step length, equal weight bearing, no [place], and 1/10 pain or less to allow her to return to community ambulation without  pain.    Time 4   Period Weeks   Status New                Plan - 25-Oct-2015 1504    Clinical Impression Statement Pt presents to PT following L knee arthroscopy on 10/03/15. Pt demonstrates good L knee ROM, with AROM being 2-116 degrees. Pt demonstrates decreased strength of LLE, with biggest limitations being in hip extension and abduction. She also demonstrates impaired gait mechanics and decreased balance. Pt reports that she would like to come once per week due to copayment, which PT agreed would be appropriate due to her high functional level. Pt will benefit from skilled PT services at this time to improve BLE strength, improve balance, and normalize gait mechanics in order to restore PLOF.    Pt will benefit from skilled therapeutic intervention in order to improve on the following deficits Abnormal gait;Decreased activity tolerance;Decreased balance;Decreased strength;Difficulty walking   Rehab Potential Excellent   PT Frequency 1x / week  per pt request   PT Duration 4 weeks   PT Treatment/Interventions ADLs/Self Care Home Management;Gait training;Stair training;Therapeutic activities;Therapeutic exercise;Balance training;Neuromuscular re-education;Patient/family education;Manual techniques;Passive range of motion   PT Next Visit Plan Review goals and HEP, progress to lunges, TKE, and squats and upgrade HEP          G-Codes - October 25, 2015 1627    Functional Assessment Tool Used FOTO   Functional Limitation Mobility: Walking and moving around   Mobility: Walking and Moving Around Current Status [phone]) At least 40 percent but less than 60 percent impaired, limited or restricted   Mobility: Walking and Moving Around Goal Status [phone]) At least 40 percent but less than 60 percent impaired, limited or restricted       Problem List Patient Active Problem List   Diagnosis Date Noted  . Neck pain, chronic 01/13/2014  . Baker's cyst of knee 12/09/2013  . Stiffness of joint, not elsewhere classified, other specified site  03/02/2013  . Weakness of neck 03/02/2013    Hilma Favors, PT, DPT [phone] October 25, 2015, 4:30 PM  [facility] [address] [place], [place], [address] Phone: [phone]   Fax:  [phone]  Name: [name] MRN: [id number] Date of Birth: Mar 06, 1946

## 2015-10-31 ENCOUNTER — Ambulatory Visit (HOSPITAL_COMMUNITY): Payer: Medicare Other

## 2015-11-09 ENCOUNTER — Ambulatory Visit (HOSPITAL_COMMUNITY): Payer: Medicare Other | Admitting: Physical Therapy

## 2015-11-09 DIAGNOSIS — R29898 Other symptoms and signs involving the musculoskeletal system: Secondary | ICD-10-CM

## 2015-11-09 DIAGNOSIS — R262 Difficulty in walking, not elsewhere classified: Secondary | ICD-10-CM

## 2015-11-09 DIAGNOSIS — R2689 Other abnormalities of gait and mobility: Secondary | ICD-10-CM

## 2015-11-09 DIAGNOSIS — M25562 Pain in left knee: Secondary | ICD-10-CM

## 2015-11-09 NOTE — Therapy (Signed)
[facility] [address] [place], [place], [address] Phone: [phone]   Fax:  [phone]  Physical Therapy Treatment  Patient Details  Name: CEDRA ETSITTY MRN: [id number] Date of Birth: [birth year] Referring Provider: Dr. Elsie Saas  Encounter Date: 11/09/2015      PT End of Session - 11/09/15 1006    Visit Number 2   Number of Visits 8   Date for PT Re-Evaluation 11/17/15   Authorization Time Period 10/18/15-12/19/15   Authorization - Visit Number 2   Authorization - Number of Visits 8   PT Start Time 0930   PT Stop Time 1013   PT Time Calculation (min) 43 min      Past Medical History  Diagnosis Date  . Depression     Past Surgical History  Procedure Laterality Date  . Cesarean section    . Rotator cuff repair    . Ankle fracture surgery    . Breast lumpectomy    . Appendectomy      There were no vitals filed for this visit.  Visit Diagnosis:  Left leg weakness  Left knee pain  Difficulty walking  Impairment of balance      Subjective Assessment - 11/09/15 0931    Subjective Pt states that she had been doing great but last Friday she slipped on some gravel and hyper extended her knee which increased her pain.    Currently in Pain? No/denies   Pain Orientation Left                         [facility] Adult PT Treatment/Exercise - 11/09/15 0935    Exercises   Exercises Knee/Hip   Knee/Hip Exercises: Stretches   Active Hamstring Stretch Both;3 reps;30 seconds   Gastroc Stretch Left;3 reps;30 seconds   Gastroc Stretch Limitations slant board   Knee/Hip Exercises: Standing   Heel Raises Both;10 reps   Knee Flexion Strengthening;Left;10 reps   Knee Flexion Limitations 3#   Forward Lunges Both;10 reps   Terminal Knee Extension Left;10 reps   Hip Abduction Stengthening;10 reps;Limitations   Abduction Limitations 3#   Hip Extension Stengthening;Left;10 reps   Extension Limitations 3#   Forward Step Up Left;10 reps;Hand Hold: 0;Step Height: 4"   Functional Squat 10 reps   Rocker Board 2 minutes   SLS x5 reps B                PT Education - 11/09/15 1006    Education provided Yes   Education Details updated HEP    Person(s) Educated Patient   Methods Explanation   Comprehension Verbalized understanding          PT Short Term Goals - 11/09/15 1012    PT SHORT TERM GOAL #1   Title Pt will be independent with HEP.   Time 2   Period Weeks   Status Achieved   PT SHORT TERM GOAL #2   Title Improve L hip extensor and abductor strenegth to 4-/5 or greater to improve ability to climb stairs reciprocally.   Time 2   Period Weeks   Status On-going   PT SHORT TERM GOAL #3   Title Improve balance evidenced by pt's ability to maintain SLS x 20" on LLE.   Time 2   Period Weeks   Status On-going           PT Long Term Goals - 11/09/15 1012    PT LONG TERM GOAL #  1   Title Pt will be independent with advanced HEP for LE strengthening and balance.    Time 4   Period Weeks   Status On-going   PT LONG TERM GOAL #2   Title Improve LLE strength to 4+/5 or greater to improve gait mechanics and ability to climb stairs reciprocally without pain.   Time 4   Period Weeks   Status On-going   PT LONG TERM GOAL #3   Title Increase L knee ROM to 0-120 degrees to normalize gait mechanics and decrease fall risk.   Time 4   Period Weeks   Status On-going   PT LONG TERM GOAL #4   Title Improve balance as evidenced by ability to maintain SLS on LLE x 45" or more.    Time 4   Period Weeks   Status On-going   PT LONG TERM GOAL #5   Title Pt will ambulate 1,000 feet with equal step length, equal weight bearing, no [place], and 1/10 pain or less to allow her to return to community ambulation without pain.    Time 4   Period Weeks   Status On-going               Plan - 11/09/15 1007    Clinical Impression Statement Reviewed pt evaluation and goals.   Added closed chained exercises to address balance and strength with verbal cuing needed for proper technique.  HEP updated.    PT Next Visit Plan begin sit to stand; side step with t-band  next treatment.         Problem List Patient Active Problem List   Diagnosis Date Noted  . Neck pain, chronic 01/13/2014  . Baker's cyst of knee 12/09/2013  . Stiffness of joint, not elsewhere classified, other specified site 03/02/2013  . Weakness of neck 03/02/2013    Rayetta Humphrey, PT CLT [phone] 11/09/2015, 10:17 AM  [facility] [address] [place], [place], [address] Phone: [phone]   Fax:  [phone]  Name: TIYAH BABKA MRN: [id number] Date of Birth: 1946/11/09

## 2015-11-09 NOTE — Patient Instructions (Addendum)
Heel Raise: Bilateral (Standing)    Rise on balls of feet. Repeat __10__ times per set. Do ___1_ sets per session. Do 2____ sessions per day.  http://orth.exer.us/38   Copyright  VHI. All rights reserved.  Functional Quadriceps: Chair Squat    Keeping feet flat on floor, shoulder width apart, squat as low as is comfortable. Use support as necessary. Repeat _10___ times per set. Do _1___ sets per session. Do _2___ sessions per day.  http://orth.exer.us/736   Copyright  VHI. All rights reserved.  Balance: Unilateral    Attempt to balance on left leg, eyes open. Hold __10__ seconds. Repeat __5__ times per set. Do ___1_ sets per session. Do ____ sessions per day. Perform exercise with eyes closed.2 http://orth.exer.us/28   Copyright  VHI. All rights reserved.  Step-Down / Step-Up   Step up onto Stand  __4-6__ inch stool or step . Slowly bend left leg, lowering other foot to floor.  Repeat __10__ times per set. Do _1___ sets per session. Do ___2_ sessions per day.  http://orth.exer.us/684   Copyright  VHI. All rights reserved.

## 2015-11-14 ENCOUNTER — Telehealth (HOSPITAL_COMMUNITY): Payer: Self-pay

## 2015-11-14 NOTE — Telephone Encounter (Signed)
Called pt twice offered today and tomorrow, pt requested to be removed from wait list she has made other plans for this week. NF 11/14/15

## 2015-11-16 ENCOUNTER — Ambulatory Visit (HOSPITAL_COMMUNITY): Payer: Medicare Other | Attending: Orthopedic Surgery

## 2015-11-16 DIAGNOSIS — R262 Difficulty in walking, not elsewhere classified: Secondary | ICD-10-CM | POA: Diagnosis present

## 2015-11-16 DIAGNOSIS — M25562 Pain in left knee: Secondary | ICD-10-CM | POA: Insufficient documentation

## 2015-11-16 DIAGNOSIS — M542 Cervicalgia: Secondary | ICD-10-CM | POA: Diagnosis present

## 2015-11-16 DIAGNOSIS — R2689 Other abnormalities of gait and mobility: Secondary | ICD-10-CM

## 2015-11-16 DIAGNOSIS — R29898 Other symptoms and signs involving the musculoskeletal system: Secondary | ICD-10-CM | POA: Insufficient documentation

## 2015-11-16 DIAGNOSIS — G8929 Other chronic pain: Secondary | ICD-10-CM

## 2015-11-16 NOTE — Therapy (Signed)
[facility] [place], [place], [address] Phone: [phone]   Fax:  [phone]  Physical Therapy Treatment  Patient Details  Name: [name] MRN: [id number] Date of Birth: [birth year] Referring Provider: Dr. Elsie Saas  Encounter Date: 11/16/2015      PT End of Session - 11/16/15 0936    Visit Number 3   Number of Visits 8   Date for PT Re-Evaluation 11/17/15   Authorization Type BCBS Medicare   Authorization Time Period 10/18/15-12/19/15   Authorization - Visit Number 3   Authorization - Number of Visits 8   PT Start Time 0933   PT Stop Time 1018   PT Time Calculation (min) 45 min   Activity Tolerance Patient tolerated treatment well   Behavior During Therapy [facility] for tasks assessed/performed      Past Medical History  Diagnosis Date  . Depression     Past Surgical History  Procedure Laterality Date  . Cesarean section    . Rotator cuff repair    . Ankle fracture surgery    . Breast lumpectomy    . Appendectomy      There were no vitals filed for this visit.  Visit Diagnosis:  Left leg weakness  Left knee pain  Difficulty walking  Impairment of balance  Neck pain, chronic      Subjective Assessment - 11/16/15 0933    Subjective Pt stated her knee is very achey today, pain scale 2/10   Currently in Pain? Yes   Pain Score 2    Pain Location Epigastric   Pain Orientation Left;Anterior;Distal;Posterior   Pain Descriptors / Indicators Aching                         [facility] Adult PT Treatment/Exercise - 11/16/15 0001    Knee/Hip Exercises: Stretches   Active Hamstring Stretch Both;3 reps;30 seconds   Active Hamstring Stretch Limitations 12in step   Gastroc Stretch 3 reps;30 seconds;Both   Gastroc Stretch Limitations slant board   Knee/Hip Exercises: Standing   Heel Raises Both;10 reps   Heel Raises Limitations pain in ankle with toe raises   Knee Flexion  Strengthening;Left;10 reps   Knee Flexion Limitations 3#   Forward Lunges Both;10 reps   Forward Lunges Limitations 4in step   Terminal Knee Extension Left;10 reps;Limitations   Terminal Knee Extension Limitations irritated with theraband posterior knee, manual TKE   Forward Step Up Left;10 reps;Hand Hold: 0;Step Height: 4"   Functional Squat 10 reps   Rocker Board 2 minutes   Rocker Board Limitations R/L and A/P 1 HHA   SLS Rt 11", Lt 6" max of 3   Other Standing Knee Exercises sidestepping with RTB 1RT   Knee/Hip Exercises: Seated   Other Seated Knee/Hip Exercises toe raises 10x   Sit to Sand 10 reps;without UE support           PT Short Term Goals - 11/09/15 1012    PT SHORT TERM GOAL #1   Title Pt will be independent with HEP.   Time 2   Period Weeks   Status Achieved   PT SHORT TERM GOAL #2   Title Improve L hip extensor and abductor strenegth to 4-/5 or greater to improve ability to climb stairs reciprocally.   Time 2   Period Weeks   Status On-going   PT SHORT TERM GOAL #3   Title Improve balance evidenced by pt's ability to  maintain SLS x 20" on LLE.   Time 2   Period Weeks   Status On-going           PT Long Term Goals - 11/09/15 1012    PT LONG TERM GOAL #1   Title Pt will be independent with advanced HEP for LE strengthening and balance.    Time 4   Period Weeks   Status On-going   PT LONG TERM GOAL #2   Title Improve LLE strength to 4+/5 or greater to improve gait mechanics and ability to climb stairs reciprocally without pain.   Time 4   Period Weeks   Status On-going   PT LONG TERM GOAL #3   Title Increase L knee ROM to 0-120 degrees to normalize gait mechanics and decrease fall risk.   Time 4   Period Weeks   Status On-going   PT LONG TERM GOAL #4   Title Improve balance as evidenced by ability to maintain SLS on LLE x 45" or more.    Time 4   Period Weeks   Status On-going   PT LONG TERM GOAL #5   Title Pt will ambulate 1,000 feet with  equal step length, equal weight bearing, no [place], and 1/10 pain or less to allow her to return to community ambulation without pain.    Time 4   Period Weeks   Status On-going               Plan - 11/16/15 1027    Clinical Impression Statement Added sit to stand and sidestepping for gluteal strengthening to improve functional strengthening.  Pt able to perform all exercises with good form and technqiue following demonstration and cueing.  Pt reported increased ankle pain with toe raises standing, able to complete seated with no difficutly and reports of tenderness posterior knee with TKE exercises, no theraband resistnace used for TKE exercise just manual.  No reports of increased pain through session.     PT Next Visit Plan Begin lateral step ups next session.          Problem List Patient Active Problem List   Diagnosis Date Noted  . Neck pain, chronic 01/13/2014  . Baker's cyst of knee 12/09/2013  . Stiffness of joint, not elsewhere classified, other specified site 03/02/2013  . Weakness of neck 03/02/2013   Ihor Austin, LPTA; CBIS [phone]  Aldona Lento 11/16/2015, 10:48 AM  [facility] [place], [place], [address] Phone: [phone]   Fax:  [phone]  Name: [name] MRN: [id number] Date of Birth: 04/26/1946

## 2015-11-20 ENCOUNTER — Ambulatory Visit (HOSPITAL_COMMUNITY): Payer: Medicare Other | Admitting: Physical Therapy

## 2015-11-23 ENCOUNTER — Ambulatory Visit (HOSPITAL_COMMUNITY): Payer: Medicare Other | Admitting: Physical Therapy

## 2015-11-23 DIAGNOSIS — R29898 Other symptoms and signs involving the musculoskeletal system: Secondary | ICD-10-CM

## 2015-11-23 DIAGNOSIS — R262 Difficulty in walking, not elsewhere classified: Secondary | ICD-10-CM

## 2015-11-23 DIAGNOSIS — R2689 Other abnormalities of gait and mobility: Secondary | ICD-10-CM

## 2015-11-23 DIAGNOSIS — M25562 Pain in left knee: Secondary | ICD-10-CM

## 2015-11-23 NOTE — Therapy (Signed)
[facility] [place], [place], [address] Phone: [phone]   Fax:  [phone]  Physical Therapy Treatment  Patient Details  Name: FAVOR MASSOTH MRN: [id number] Date of Birth: Jun 12, 1946 Referring Provider: Dr. Elsie Saas  Encounter Date: 11/23/2015      PT End of Session - 11/23/15 1045    Visit Number 4   Number of Visits 8   Date for PT Re-Evaluation 11/17/15   Authorization Type BCBS Medicare   Authorization Time Period 10/18/15-12/19/15   Authorization - Visit Number 4   Authorization - Number of Visits 8   PT Start Time [id number]   PT Stop Time 1055   PT Time Calculation (min) 41 min   Activity Tolerance Patient tolerated treatment well      Past Medical History  Diagnosis Date  . Depression     Past Surgical History  Procedure Laterality Date  . Cesarean section    . Rotator cuff repair    . Ankle fracture surgery    . Breast lumpectomy    . Appendectomy      There were no vitals filed for this visit.  Visit Diagnosis:  Left leg weakness  Left knee pain  Difficulty walking  Impairment of balance      Subjective Assessment - 11/23/15 1015    Subjective Pt states that going up and down steps is easier now but squatting to get into the lower cabinets is still difficulty.  She states that she had pain after last treatment.    Currently in Pain? No/denies   Pain Location Knee              [facility] Adult PT Treatment/Exercise - 11/23/15 0001    Knee/Hip Exercises: Stretches   Active Hamstring Stretch Left;3 reps;30 seconds   Active Hamstring Stretch Limitations supine   Gastroc Stretch Left;3 reps;30 seconds   Knee/Hip Exercises: Standing   Lateral Step Up Left;10 reps;Hand Hold: 1;Step Height: 6"   Forward Step Up Left;10 reps;Hand Hold: 1;Step Height: 6"   Functional Squat 10 reps   Rocker Board 2 minutes   SLS --   SLS with Vectors 5x 10" each    Other Standing Knee Exercises  sidestepping with RTB 1RT   Other Standing Knee Exercises standing extension/abduction with blue t-band x 10 reps each    Knee/Hip Exercises: Seated   Sit to Sand 10 reps   Manual Therapy   Manual Therapy Soft tissue mobilization   Soft tissue mobilization posterior knee to reduce pain.                 PT Education - 11/23/15 1044    Education provided Yes   Education Details to complete supine hamstring stretch twice a day   Person(s) Educated Patient   Methods Explanation;Tactile cues   Comprehension Verbalized understanding;Returned demonstration          PT Short Term Goals - 11/09/15 1012    PT SHORT TERM GOAL #1   Title Pt will be independent with HEP.   Time 2   Period Weeks   Status Achieved   PT SHORT TERM GOAL #2   Title Improve L hip extensor and abductor strenegth to 4-/5 or greater to improve ability to climb stairs reciprocally.   Time 2   Period Weeks   Status On-going   PT SHORT TERM GOAL #3   Title Improve balance evidenced by pt's ability to maintain SLS x 20" on LLE.  Time 2   Period Weeks   Status On-going           PT Long Term Goals - 11/09/15 1012    PT LONG TERM GOAL #1   Title Pt will be independent with advanced HEP for LE strengthening and balance.    Time 4   Period Weeks   Status On-going   PT LONG TERM GOAL #2   Title Improve LLE strength to 4+/5 or greater to improve gait mechanics and ability to climb stairs reciprocally without pain.   Time 4   Period Weeks   Status On-going   PT LONG TERM GOAL #3   Title Increase L knee ROM to 0-120 degrees to normalize gait mechanics and decrease fall risk.   Time 4   Period Weeks   Status On-going   PT LONG TERM GOAL #4   Title Improve balance as evidenced by ability to maintain SLS on LLE x 45" or more.    Time 4   Period Weeks   Status On-going   PT LONG TERM GOAL #5   Title Pt will ambulate 1,000 feet with equal step length, equal weight bearing, no trend[place], and  1/10 pain or less to allow her to return to community ambulation without pain.    Time 4   Period Weeks   Status On-going               Plan - 11/23/15 1046    Clinical Impression Statement Manual palpation of posterior aspect of knee does not show tight mm rather a possible cyst.  Therapist advised pt to speak to MD next week about her discomfort.  Added lateral step up and vector stance to program.  Pt needs verbal cuing when completing squats to keep proper form.    Pt will benefit from skilled therapeutic intervention in order to improve on the following deficits Abnormal gait;Decreased activity tolerance;Decreased balance;Decreased strength;Difficulty walking   PT Next Visit Plan begin stair climbing activity.  Give lateral step ups and vector stances as a HEP    Consulted and Agree with Plan of Care Patient        Problem List Patient Active Problem List   Diagnosis Date Noted  . Neck pain, chronic 01/13/2014  . Baker's cyst of knee 12/09/2013  . Stiffness of joint, not elsewhere classified, other specified site 03/02/2013  . Weakness of neck 03/02/2013    Rayetta Humphrey, PT CLT [phone] 11/23/2015, 11:00 AM  [facility] [address] [place], [place], [address] Phone: [phone]   Fax:  [phone]  Name: TANGANYIKA CAULDER MRN: [id number] Date of Birth: 1946-01-11

## 2015-11-27 ENCOUNTER — Ambulatory Visit (HOSPITAL_COMMUNITY): Payer: Medicare Other | Admitting: Physical Therapy

## 2015-11-27 DIAGNOSIS — R29898 Other symptoms and signs involving the musculoskeletal system: Secondary | ICD-10-CM | POA: Diagnosis not present

## 2015-11-27 DIAGNOSIS — M25562 Pain in left knee: Secondary | ICD-10-CM

## 2015-11-27 DIAGNOSIS — R262 Difficulty in walking, not elsewhere classified: Secondary | ICD-10-CM

## 2015-11-27 DIAGNOSIS — R2689 Other abnormalities of gait and mobility: Secondary | ICD-10-CM

## 2015-11-27 NOTE — Therapy (Signed)
[facility] [place], [place], [address] Phone: [phone]   Fax:  [phone]  Physical Therapy Treatment  Patient Details  Name: RICHANDA SCHAUMAN MRN: [id number] Date of Birth: 04/13/1946 Referring Provider: Dr. Elsie Saas  Encounter Date: 11/27/2015      PT End of Session - 11/27/15 1434    Visit Number 5   Number of Visits 8   Date for PT Re-Evaluation 11/17/15   Authorization Type BCBS Medicare   Authorization Time Period 10/18/15-12/19/15   Authorization - Visit Number 5   Authorization - Number of Visits 8   PT Start Time [id number]   PT Stop Time 1435   PT Time Calculation (min) 41 min   Activity Tolerance Patient tolerated treatment well   Behavior During Therapy [facility] for tasks assessed/performed      Past Medical History  Diagnosis Date  . Depression     Past Surgical History  Procedure Laterality Date  . Cesarean section    . Rotator cuff repair    . Ankle fracture surgery    . Breast lumpectomy    . Appendectomy      There were no vitals filed for this visit.  Visit Diagnosis:  Left leg weakness  Left knee pain  Difficulty walking  Impairment of balance      Subjective Assessment - 11/27/15 1355    Subjective Pt states that the back of her knee aggrevated her all weekend long.    Currently in Pain? Yes   Pain Score 3              [facility] Adult PT Treatment/Exercise - 11/27/15 0001    Knee/Hip Exercises: Stretches   Active Hamstring Stretch Left;3 reps;30 seconds   Active Hamstring Stretch Limitations supine   Gastroc Stretch Left;3 reps;30 seconds   Knee/Hip Exercises: Standing   Heel Raises Both;15 reps   Knee Flexion Strengthening;Left;10 reps   Knee Flexion Limitations 4#   Forward Lunges Left;10 reps   Forward Lunges Limitations UE with 2#    Side Lunges Both;10 reps   Side Lunges Limitations 2 # in UE UE goes into 90degree abduction at the same time.    Terminal Knee  Extension Left;10 reps;Limitations   Lateral Step Up Left;10 reps;Hand Hold: 1;Step Height: 6"   Forward Step Up Left;10 reps;Hand Hold: 1;Step Height: 6"   Rocker Board 2 minutes   Other Standing Knee Exercises sidestepping with RTB 1RT   Manual Therapy   Manual Therapy Edema management;Soft tissue mobilization   Manual therapy comments anterior knee                   PT Short Term Goals - 11/09/15 1012    PT SHORT TERM GOAL #1   Title Pt will be independent with HEP.   Time 2   Period Weeks   Status Achieved   PT SHORT TERM GOAL #2   Title Improve L hip extensor and abductor strenegth to 4-/5 or greater to improve ability to climb stairs reciprocally.   Time 2   Period Weeks   Status On-going   PT SHORT TERM GOAL #3   Title Improve balance evidenced by pt's ability to maintain SLS x 20" on LLE.   Time 2   Period Weeks   Status On-going           PT Long Term Goals - 11/09/15 1012    PT LONG TERM GOAL #1   Title  Pt will be independent with advanced HEP for LE strengthening and balance.    Time 4   Period Weeks   Status On-going   PT LONG TERM GOAL #2   Title Improve LLE strength to 4+/5 or greater to improve gait mechanics and ability to climb stairs reciprocally without pain.   Time 4   Period Weeks   Status On-going   PT LONG TERM GOAL #3   Title Increase L knee ROM to 0-120 degrees to normalize gait mechanics and decrease fall risk.   Time 4   Period Weeks   Status On-going   PT LONG TERM GOAL #4   Title Improve balance as evidenced by ability to maintain SLS on LLE x 45" or more.    Time 4   Period Weeks   Status On-going   PT LONG TERM GOAL #5   Title Pt will ambulate 1,000 feet with equal step length, equal weight bearing, no trend[place], and 1/10 pain or less to allow her to return to community ambulation without pain.    Time 4   Period Weeks   Status On-going               Plan - 11/27/15 1435    Clinical Impression  Statement Pt with increased pain today.  Noted slight swelling but pt does admit to being out shopping.  Added manual to decrease pain and swelling.  Pt has noted increased balance but still needs verbal cuing to complete exercises in proper form.     Pt will benefit from skilled therapeutic intervention in order to improve on the following deficits Abnormal gait;Decreased activity tolerance;Decreased balance;Decreased strength;Difficulty walking   PT Next Visit Plan Quick check of ROM and strength for Return to MD next visit.         Problem List Patient Active Problem List   Diagnosis Date Noted  . Neck pain, chronic 01/13/2014  . Baker's cyst of knee 12/09/2013  . Stiffness of joint, not elsewhere classified, other specified site 03/02/2013  . Weakness of neck 03/02/2013    Rayetta Humphrey, PT CLT [phone] 11/27/2015, 2:44 PM  [facility] [address] [place], [place], [address] Phone: [phone]   Fax:  [phone]  Name: ARDALIA NEUMEISTER MRN: [id number] Date of Birth: 06-01-1946

## 2015-11-29 ENCOUNTER — Ambulatory Visit (HOSPITAL_COMMUNITY): Payer: Medicare Other | Admitting: Physical Therapy

## 2015-11-29 DIAGNOSIS — R29898 Other symptoms and signs involving the musculoskeletal system: Secondary | ICD-10-CM

## 2015-11-29 DIAGNOSIS — R2689 Other abnormalities of gait and mobility: Secondary | ICD-10-CM

## 2015-11-29 DIAGNOSIS — M25562 Pain in left knee: Secondary | ICD-10-CM

## 2015-11-29 DIAGNOSIS — R262 Difficulty in walking, not elsewhere classified: Secondary | ICD-10-CM

## 2015-11-29 NOTE — Therapy (Signed)
[facility] [address] [place], [place], [address] Phone: [phone]   Fax:  [phone]  Physical Therapy Treatment  Patient Details  Name: [name] MRN: [id number] Date of Birth: 10-21-1946 Referring Provider: Dr. Elsie Saas  Encounter Date: 11/29/2015      PT End of Session - 11/29/15 1341    Visit Number 6   Number of Visits 12   Date for PT Re-Evaluation 12/29/15   Authorization Type BCBS Medicare   Authorization Time Period 10/18/15-12/19/15   Authorization - Visit Number 6   Authorization - Number of Visits 12   PT Start Time [id number]   PT Stop Time 1344   PT Time Calculation (min) 41 min   Activity Tolerance Patient tolerated treatment well      Past Medical History  Diagnosis Date  . Depression     Past Surgical History  Procedure Laterality Date  . Cesarean section    . Rotator cuff repair    . Ankle fracture surgery    . Breast lumpectomy    . Appendectomy      There were no vitals filed for this visit.  Visit Diagnosis:  Left leg weakness  Left knee pain  Difficulty walking  Impairment of balance      Subjective Assessment - 11/29/15 1305    Subjective Ms. Vankleeck states that her soreness worked out yesterday.     How long can you sit comfortably? no limitations   How long can you stand comfortably? Able to stand for over an hour now was 30 minutes    How long can you walk comfortably? Able to walk for many hours now was 45 minutes .   Currently in Pain? No/denies  was having pain as high as a 6-7/10 in the past several days.  Pain tends to be in the posterior aspect.  Questionable Bakers Cyst to be determined by MD>            [facility] PT Assessment - 11/29/15 0001    Assessment   Medical Diagnosis L knee arthroscopy   Onset Date/Surgical Date 10/03/15   Next MD Visit 11/30/2015   Prior Therapy no   Precautions   Precautions None   Restrictions   Weight Bearing Restrictions No   Home  Environment   Living Environment Private residence   Living Arrangements Spouse/significant other   Type of [place] to enter   Entrance Stairs-Number of Steps 2   Entrance Stairs-Rails None   Home Layout Two level;Able to live on main level with bedroom/bathroom   Alternate Level Stairs-Number of Steps 15   Alternate Level Stairs-Rails Right;Left   Prior Function   Level of Independence Independent   Vocation Retired   Leisure Enjoys doing crafts, Haematologist   Observation/Other Assessments   Focus on Therapeutic Outcomes (FOTO)  39%   Functional Tests   Functional tests Single leg stance   Single Leg Stance   Comments  14" Rt; 12" LT was 3" on LLE   AROM   Left Knee Extension 0  was 2    Left Knee Flexion 135  116   Strength   Right/Left Hip Left   Right Hip Flexion 5/5  was 4+/5    Right Hip Extension 4-/5   Right Hip ABduction 4-/5   Left Hip Flexion 5/5  was 4+/5    Left Hip Extension 3+/5   Left Hip ABduction 4-/5  was 3+  Right Knee Flexion 4+/5   Right Knee Extension 5/5  was 4+/5    Left Knee Flexion 4/5  was 4/5    Left Knee Extension 4/5   Transfers   Five time sit to stand comments  12 seconds was 11.37    Ambulation/Gait   Gait Pattern Decreased weight shift to left;Decreased stance time - left;Decreased step length - right                     [facility] Adult PT Treatment/Exercise - 11/29/15 0001    Ambulation/Gait   Gait Comments --   Knee/Hip Exercises: Standing   Heel Raises 15 reps   Functional Squat 15 reps   SLS x5   Knee/Hip Exercises: Sidelying   Hip ABduction Strengthening;Left;10 reps;Limitations   Hip ABduction Limitations 3#   Knee/Hip Exercises: Prone   Hamstring Curl 10 reps   Hamstring Curl Limitations 3#   Hip Extension 10 reps   Hip Extension Limitations 3#                  PT Short Term Goals - 11/29/15 1342    PT SHORT TERM GOAL #1   Title Pt will be independent with HEP.    Time 2   Period Weeks   Status Achieved   PT SHORT TERM GOAL #2   Title Improve L hip extensor and abductor strenegth to 4-/5 or greater to improve ability to climb stairs reciprocally.   Time 2   Period Weeks   Status On-going   PT SHORT TERM GOAL #3   Title Improve balance evidenced by pt's ability to maintain SLS x 20" on LLE.   Time 2   Period Weeks   Status On-going           PT Long Term Goals - 11/29/15 1343    PT LONG TERM GOAL #1   Title Pt will be independent with advanced HEP for LE strengthening and balance.    Time 4   Period Weeks   Status On-going   PT LONG TERM GOAL #2   Title Improve LLE strength to 4+/5 or greater to improve gait mechanics and ability to climb stairs reciprocally without pain.   Time 4   Period Weeks   Status On-going   PT LONG TERM GOAL #3   Title Increase L knee ROM to 0-120 degrees to normalize gait mechanics and decrease fall risk.   Time 4   Period Weeks   Status Achieved   PT LONG TERM GOAL #4   Title Improve balance as evidenced by ability to maintain SLS on LLE x 45" or more.    Time 4   Period Weeks   Status On-going   PT LONG TERM GOAL #5   Title Pt will ambulate 1,000 feet with equal step length, equal weight bearing, no [place], and 1/10 pain or less to allow her to return to community ambulation without pain.    Time 4   Period Weeks   Status Achieved               Plan - 11/29/15 1340    Clinical Impression Statement Pt reassessed for MD visit. Pt ROM is now normal but has continued strength and balance deficits which are affecting her functional ability. Pt will continue to benefit from skilled PT to address these issues and maximize her functional ability.     PT Frequency 2x / week   PT Duration 6 weeks  PT Next Visit Plan Begin steps           G-Codes - 12-02-2015 1433    Functional Assessment Tool Used FOTO   Functional Limitation Mobility: Walking and moving around   Mobility: Walking and  Moving Around Current Status [phone]) At least 20 percent but less than 40 percent impaired, limited or restricted   Mobility: Walking and Moving Around Goal Status [phone]) At least 40 percent but less than 60 percent impaired, limited or restricted      Problem List Patient Active Problem List   Diagnosis Date Noted  . Neck pain, chronic 01/13/2014  . Baker's cyst of knee 12/09/2013  . Stiffness of joint, not elsewhere classified, other specified site 03/02/2013  . Weakness of neck 03/02/2013    Rayetta Humphrey, PT CLT [phone] December 02, 2015, 2:34 PM  [facility] [address] [place], [place], [address] Phone: [phone]   Fax:  [phone]  Name: [name] MRN: [id number] Date of Birth: 02/24/1946  Physical Therapy Progress Note  Dates of Reporting Period: 10/18/2015  to 11/28/2015  Objective Reports of Subjective Statement: Pt is still having pain in the back of her knee,(questionable Bakers cyst), Pt improving but still having difficulty  With steps and squatting .  Objective Measurements: see above   Goal Update: see above  Plan: continue 2x a week for 3 more weeks or a total of 6 weeks.   Reason Skilled Services are Required: decrease pain and improve functional mobility

## 2015-12-04 ENCOUNTER — Ambulatory Visit (HOSPITAL_COMMUNITY): Payer: Medicare Other | Admitting: Physical Therapy

## 2015-12-04 ENCOUNTER — Telehealth (HOSPITAL_COMMUNITY): Payer: Self-pay | Admitting: Physical Therapy

## 2015-12-04 NOTE — Telephone Encounter (Signed)
she is taking a new medicine and not feeling well today will reschedule later. NF  12/04/15

## 2015-12-08 ENCOUNTER — Ambulatory Visit (HOSPITAL_COMMUNITY): Payer: Medicare Other | Admitting: Physical Therapy

## 2015-12-11 ENCOUNTER — Ambulatory Visit (HOSPITAL_COMMUNITY): Payer: Medicare Other | Admitting: Physical Therapy

## 2015-12-11 DIAGNOSIS — R29898 Other symptoms and signs involving the musculoskeletal system: Secondary | ICD-10-CM | POA: Diagnosis not present

## 2015-12-11 DIAGNOSIS — M25562 Pain in left knee: Secondary | ICD-10-CM

## 2015-12-11 DIAGNOSIS — R262 Difficulty in walking, not elsewhere classified: Secondary | ICD-10-CM

## 2015-12-11 DIAGNOSIS — R2689 Other abnormalities of gait and mobility: Secondary | ICD-10-CM

## 2015-12-11 NOTE — Therapy (Signed)
[facility] [address] [place], [place], [address] Phone: [phone]   Fax:  [phone]  Physical Therapy Treatment  Patient Details  Name: [name] MRN: [id number] Date of Birth: 07-10-1946 Referring Provider: Dr. Elsie Saas  Encounter Date: 12/11/2015      PT End of Session - 12/11/15 1342    Visit Number 7   Number of Visits 12   Date for PT Re-Evaluation 12/29/15   Authorization Time Period 10/18/15-12/19/15   Authorization - Visit Number 7   Authorization - Number of Visits 12   PT Start Time [id number]   PT Stop Time 1343   PT Time Calculation (min) 38 min   Behavior During Therapy [facility] for tasks assessed/performed      Past Medical History  Diagnosis Date  . Depression     Past Surgical History  Procedure Laterality Date  . Cesarean section    . Rotator cuff repair    . Ankle fracture surgery    . Breast lumpectomy    . Appendectomy      There were no vitals filed for this visit.  Visit Diagnosis:  Left leg weakness  Left knee pain  Difficulty walking  Impairment of balance      Subjective Assessment - 12/11/15 1305    Subjective Pt states that she has more difficulty going down the steps than up    Currently in Pain? No/denies               [facility] Adult PT Treatment/Exercise - 12/11/15 0001    Balance Poses: Yoga   Warrior I 2 reps;30 seconds   Warrior II 2 reps;30 seconds   Warrior III 1 rep;30 seconds   Tree Pose 2 reps;30 seconds   Exercises   Exercises Knee/Hip   Knee/Hip Exercises: Standing   Stairs 2 RT   SLS with Vectors 4 x 10"    Other Standing Knee Exercises sidestepping with BTB 1RT   Other Standing Knee Exercises tandem stance with head turns x 10 with Rt forward; 10 with Lt forward                   PT Short Term Goals - 11/29/15 1342    PT SHORT TERM GOAL #1   Title Pt will be independent with HEP.   Time 2   Period Weeks   Status Achieved   PT SHORT  TERM GOAL #2   Title Improve L hip extensor and abductor strenegth to 4-/5 or greater to improve ability to climb stairs reciprocally.   Time 2   Period Weeks   Status On-going   PT SHORT TERM GOAL #3   Title Improve balance evidenced by pt's ability to maintain SLS x 20" on LLE.   Time 2   Period Weeks   Status On-going           PT Long Term Goals - 11/29/15 1343    PT LONG TERM GOAL #1   Title Pt will be independent with advanced HEP for LE strengthening and balance.    Time 4   Period Weeks   Status On-going   PT LONG TERM GOAL #2   Title Improve LLE strength to 4+/5 or greater to improve gait mechanics and ability to climb stairs reciprocally without pain.   Time 4   Period Weeks   Status On-going   PT LONG TERM GOAL #3   Title Increase L knee ROM to 0-120 degrees  to normalize gait mechanics and decrease fall risk.   Time 4   Period Weeks   Status Achieved   PT LONG TERM GOAL #4   Title Improve balance as evidenced by ability to maintain SLS on LLE x 45" or more.    Time 4   Period Weeks   Status On-going   PT LONG TERM GOAL #5   Title Pt will ambulate 1,000 feet with equal step length, equal weight bearing, no [place], and 1/10 pain or less to allow her to return to community ambulation without pain.    Time 4   Period Weeks   Status Achieved               Plan - 12/11/15 1345    Clinical Impression Statement Pt MD sent reassessment signed by MD stating that he would like [place] to work on strengthening as well as balance.  Added yoga poses with therapist facilitation.  Pt will continue for 5 more visits.    Pt will benefit from skilled therapeutic intervention in order to improve on the following deficits Abnormal gait;Decreased activity tolerance;Decreased balance;Decreased strength;Difficulty walking   PT Next Visit Plan begin hurdles on balance beam as welll as retro gt on balance beam.    Consulted and Agree with Plan of Care Patient         Problem List Patient Active Problem List   Diagnosis Date Noted  . Neck pain, chronic 01/13/2014  . Baker's cyst of knee 12/09/2013  . Stiffness of joint, not elsewhere classified, other specified site 03/02/2013  . Weakness of neck 03/02/2013    Rayetta Humphrey, PT CLT [phone] 12/11/2015, 1:48 PM  [facility] [address] [place], [place], [address] Phone: [phone]   Fax:  [phone]  Name: [name] MRN: [id number] Date of Birth: May 05, 1946

## 2015-12-15 ENCOUNTER — Ambulatory Visit (HOSPITAL_COMMUNITY): Payer: Medicare Other | Attending: Orthopedic Surgery | Admitting: Physical Therapy

## 2015-12-15 DIAGNOSIS — G8929 Other chronic pain: Secondary | ICD-10-CM | POA: Insufficient documentation

## 2015-12-15 DIAGNOSIS — R2689 Other abnormalities of gait and mobility: Secondary | ICD-10-CM | POA: Diagnosis present

## 2015-12-15 DIAGNOSIS — M25562 Pain in left knee: Secondary | ICD-10-CM | POA: Insufficient documentation

## 2015-12-15 DIAGNOSIS — M542 Cervicalgia: Secondary | ICD-10-CM | POA: Insufficient documentation

## 2015-12-15 DIAGNOSIS — R29898 Other symptoms and signs involving the musculoskeletal system: Secondary | ICD-10-CM | POA: Insufficient documentation

## 2015-12-15 DIAGNOSIS — R262 Difficulty in walking, not elsewhere classified: Secondary | ICD-10-CM | POA: Diagnosis present

## 2015-12-15 NOTE — Therapy (Signed)
[facility] [place], [place], [address] Phone: [phone]   Fax:  [phone]  Physical Therapy Treatment  Patient Details  Name: [name] MRN: [id number] Date of Birth: 12-Jul-1946 Referring Provider: Dr. Elsie Saas  Encounter Date: 12/15/2015      PT End of Session - 12/15/15 1344    Visit Number 8   Number of Visits 12   Date for PT Re-Evaluation 12/29/15   Authorization Type BCBS Medicare   Authorization - Visit Number 8   Authorization - Number of Visits 12   PT Start Time [id number]   PT Stop Time 1345   PT Time Calculation (min) 42 min   Equipment Utilized During Treatment Gait belt   Activity Tolerance Patient tolerated treatment well   Behavior During Therapy [facility] for tasks assessed/performed      Past Medical History  Diagnosis Date  . Depression     Past Surgical History  Procedure Laterality Date  . Cesarean section    . Rotator cuff repair    . Ankle fracture surgery    . Breast lumpectomy    . Appendectomy      There were no vitals filed for this visit.  Visit Diagnosis:  Left leg weakness  Left knee pain  Difficulty walking  Impairment of balance  Neck pain, chronic      Subjective Assessment - 12/15/15 1303    Subjective Pt states that her back was irritated by the new exercises.    Currently in Pain? No/denies               [facility] Adult PT Treatment/Exercise - 12/15/15 0001    Balance Poses: Yoga   Warrior I 1 rep;30 seconds   Warrior II 2 reps;30 seconds   Warrior III 2 reps;30 seconds   Tree Pose 2 reps;30 seconds   Exercises   Exercises Knee/Hip   Knee/Hip Exercises: Standing   Stairs 3 RT   SLS with Vectors 4 x 10"    Other Standing Knee Exercises sidestepping with BTB 1RT   Other Standing Knee Exercises balance beam over hurdles x 2RT ; balance beam retro x 2 RT    Knee/Hip Exercises: Seated   Sit to Sand 10 reps                  PT Short  Term Goals - 11/29/15 1342    PT SHORT TERM GOAL #1   Title Pt will be independent with HEP.   Time 2   Period Weeks   Status Achieved   PT SHORT TERM GOAL #2   Title Improve L hip extensor and abductor strenegth to 4-/5 or greater to improve ability to climb stairs reciprocally.   Time 2   Period Weeks   Status On-going   PT SHORT TERM GOAL #3   Title Improve balance evidenced by pt's ability to maintain SLS x 20" on LLE.   Time 2   Period Weeks   Status On-going           PT Long Term Goals - 11/29/15 1343    PT LONG TERM GOAL #1   Title Pt will be independent with advanced HEP for LE strengthening and balance.    Time 4   Period Weeks   Status On-going   PT LONG TERM GOAL #2   Title Improve LLE strength to 4+/5 or greater to improve gait mechanics and ability to climb stairs reciprocally without pain.  Time 4   Period Weeks   Status On-going   PT LONG TERM GOAL #3   Title Increase L knee ROM to 0-120 degrees to normalize gait mechanics and decrease fall risk.   Time 4   Period Weeks   Status Achieved   PT LONG TERM GOAL #4   Title Improve balance as evidenced by ability to maintain SLS on LLE x 45" or more.    Time 4   Period Weeks   Status On-going   PT LONG TERM GOAL #5   Title Pt will ambulate 1,000 feet with equal step length, equal weight bearing, no [place], and 1/10 pain or less to allow her to return to community ambulation without pain.    Time 4   Period Weeks   Status Achieved               Plan - 12/15/15 1345    Clinical Impression Statement Added balance beam wtith hurdles and retro beam with minimal assist needed from therapist.  Pt demonstrated improved form in Yoga poses . Pt continues to improve in strerngth and balance.    PT Next Visit Plan tandem stance with eyes shut.         Problem List Patient Active Problem List   Diagnosis Date Noted  . Neck pain, chronic 01/13/2014  . Baker's cyst of knee 12/09/2013  .  Stiffness of joint, not elsewhere classified, other specified site 03/02/2013  . Weakness of neck 03/02/2013    Rayetta Humphrey, PT CLT [phone] 12/15/2015, 1:48 PM  [facility] [address] [place], [place], [address] Phone: [phone]   Fax:  [phone]  Name: [name] MRN: [id number] Date of Birth: [birth year]

## 2015-12-18 ENCOUNTER — Ambulatory Visit (HOSPITAL_COMMUNITY): Payer: Medicare Other | Admitting: Physical Therapy

## 2015-12-18 DIAGNOSIS — M25562 Pain in left knee: Secondary | ICD-10-CM

## 2015-12-18 DIAGNOSIS — R2689 Other abnormalities of gait and mobility: Secondary | ICD-10-CM

## 2015-12-18 DIAGNOSIS — R29898 Other symptoms and signs involving the musculoskeletal system: Secondary | ICD-10-CM

## 2015-12-18 DIAGNOSIS — R262 Difficulty in walking, not elsewhere classified: Secondary | ICD-10-CM

## 2015-12-18 NOTE — Therapy (Addendum)
[facility] [place], [place], [address] Phone: [phone]   Fax:  [phone]  Physical Therapy Treatment  Patient Details  Name: [name] MRN: [id number] Date of Birth: 1946-01-21 Referring Provider: Dr. Elsie Saas  Encounter Date: 12/18/2015      PT End of Session - 12/18/15 1204    Visit Number 9   Number of Visits 12   Date for PT Re-Evaluation 12/29/15   Authorization Type BCBS Medicare   Authorization Time Period 10/18/15-12/19/15   Authorization - Visit Number 9   Authorization - Number of Visits 12   PT Start Time 1102   PT Stop Time 1144   PT Time Calculation (min) 42 min   Activity Tolerance Patient tolerated treatment well   Behavior During Therapy [facility] for tasks assessed/performed      Past Medical History  Diagnosis Date  . Depression     Past Surgical History  Procedure Laterality Date  . Cesarean section    . Rotator cuff repair    . Ankle fracture surgery    . Breast lumpectomy    . Appendectomy      There were no vitals filed for this visit.  Visit Diagnosis:  Left leg weakness  Left knee pain  Difficulty walking  Impairment of balance      Subjective Assessment - 12/18/15 1107    Subjective PT states that she was given prednisone for her inflammation but she does not see that it is helping.  Pt has one spot that if she does a certain motion it will reallly grab.  States it's like a really bad bruise.    Currently in Pain? No/denies                         [facility] Adult PT Treatment/Exercise - 12/18/15 0001    Balance Poses: Yoga   Warrior I 2 reps;30 seconds   Warrior II 2 reps;30 seconds   Warrior III 2 reps;30 seconds   Tree Pose 2 reps;30 seconds   Exercises   Exercises Knee/Hip   Knee/Hip Exercises: Standing   Stairs 3 RT   SLS with Vectors 4 x 10"    Other Standing Knee Exercises --   Other Standing Knee Exercises --   Knee/Hip Exercises: Seated    Sit to Sand 10 reps   Modalities   Modalities Ultrasound   Ultrasound   Ultrasound Location medial inferior incision    Ultrasound Parameters 1.2w/cm2 pulsed    Ultrasound Goals Edema;Pain   Manual Therapy   Manual Therapy Edema management;Soft tissue mobilization   Manual therapy comments anterior knee   at incision.                   PT Short Term Goals - 11/29/15 1342    PT SHORT TERM GOAL #1   Title Pt will be independent with HEP.   Time 2   Period Weeks   Status Achieved   PT SHORT TERM GOAL #2   Title Improve L hip extensor and abductor strenegth to 4-/5 or greater to improve ability to climb stairs reciprocally.   Time 2   Period Weeks   Status On-going   PT SHORT TERM GOAL #3   Title Improve balance evidenced by pt's ability to maintain SLS x 20" on LLE.   Time 2   Period Weeks   Status On-going  PT Long Term Goals - 11/29/15 1343    PT LONG TERM GOAL #1   Title Pt will be independent with advanced HEP for LE strengthening and balance.    Time 4   Period Weeks   Status On-going   PT LONG TERM GOAL #2   Title Improve LLE strength to 4+/5 or greater to improve gait mechanics and ability to climb stairs reciprocally without pain.   Time 4   Period Weeks   Status On-going   PT LONG TERM GOAL #3   Title Increase L knee ROM to 0-120 degrees to normalize gait mechanics and decrease fall risk.   Time 4   Period Weeks   Status Achieved   PT LONG TERM GOAL #4   Title Improve balance as evidenced by ability to maintain SLS on LLE x 45" or more.    Time 4   Period Weeks   Status On-going   PT LONG TERM GOAL #5   Title Pt will ambulate 1,000 feet with equal step length, equal weight bearing, no [place], and 1/10 pain or less to allow her to return to community ambulation without pain.    Time 4   Period Weeks   Status Achieved               Plan - 12/18/15 1205    Clinical Impression Statement Pt has increased edema and  sensitivity along scar area.  Added pulsed ultrasound to attempt to decrease edema.  Pt continues to improve with stability with balance activities contact guard assist only.  Continue with ultrasound and manual to decrease edema.    Pt will benefit from skilled therapeutic intervention in order to improve on the following deficits Abnormal gait;Decreased activity tolerance;Decreased balance;Decreased strength;Difficulty walking   PT Frequency 2x / week   PT Duration 6 weeks   PT Next Visit Plan begin tandem stance with eyes closed continue with ultrasound and manual.      [id number] CK [id number] CJ   Problem List Patient Active Problem List   Diagnosis Date Noted  . Neck pain, chronic 01/13/2014  . Baker's cyst of knee 12/09/2013  . Stiffness of joint, not elsewhere classified, other specified site 03/02/2013  . Weakness of neck 03/02/2013   Rayetta Humphrey, PT CLT [phone] 12/18/2015, 12:10 PM  [facility] [place], [place], [address] Phone: [phone]   Fax:  [phone]  Name: [name] MRN: [id number] Date of Birth: 1946/07/23  PHYSICAL THERAPY DISCHARGE SUMMARY  Visits from Start of Care: 9  Current functional level related to goals / functional outcomes: Improved strength decreased pain   Remaining deficits: unknown   Education / Equipment: HEP  Plan: Patient agrees to discharge.  Patient goals were partially met. Patient is being discharged due to a change in medical status.  ?????  Rayetta Humphrey, [place] CLT [phone]

## 2015-12-20 ENCOUNTER — Emergency Department (HOSPITAL_COMMUNITY)
Admission: EM | Admit: 2015-12-20 | Discharge: 2015-12-20 | Disposition: A | Discharge status: Home / Self Care | Payer: Medicare Other | Attending: Emergency Medicine | Admitting: Emergency Medicine

## 2015-12-20 ENCOUNTER — Encounter (HOSPITAL_COMMUNITY): Payer: Self-pay | Admitting: *Deleted

## 2015-12-20 ENCOUNTER — Emergency Department (HOSPITAL_COMMUNITY): Payer: Medicare Other

## 2015-12-20 DIAGNOSIS — R0789 Other chest pain: Secondary | ICD-10-CM

## 2015-12-20 DIAGNOSIS — Y9389 Activity, other specified: Secondary | ICD-10-CM | POA: Diagnosis not present

## 2015-12-20 DIAGNOSIS — Y998 Other external cause status: Secondary | ICD-10-CM | POA: Insufficient documentation

## 2015-12-20 DIAGNOSIS — M542 Cervicalgia: Secondary | ICD-10-CM

## 2015-12-20 DIAGNOSIS — G8929 Other chronic pain: Secondary | ICD-10-CM

## 2015-12-20 DIAGNOSIS — S0990XA Unspecified injury of head, initial encounter: Secondary | ICD-10-CM | POA: Diagnosis present

## 2015-12-20 DIAGNOSIS — Z792 Long term (current) use of antibiotics: Secondary | ICD-10-CM | POA: Insufficient documentation

## 2015-12-20 DIAGNOSIS — S29001A Unspecified injury of muscle and tendon of front wall of thorax, initial encounter: Secondary | ICD-10-CM | POA: Insufficient documentation

## 2015-12-20 DIAGNOSIS — Y92410 Unspecified street and highway as the place of occurrence of the external cause: Secondary | ICD-10-CM | POA: Insufficient documentation

## 2015-12-20 DIAGNOSIS — S0081XA Abrasion of other part of head, initial encounter: Secondary | ICD-10-CM | POA: Diagnosis not present

## 2015-12-20 DIAGNOSIS — F329 Major depressive disorder, single episode, unspecified: Secondary | ICD-10-CM | POA: Insufficient documentation

## 2015-12-20 DIAGNOSIS — Z79899 Other long term (current) drug therapy: Secondary | ICD-10-CM | POA: Diagnosis not present

## 2015-12-20 DIAGNOSIS — S199XXA Unspecified injury of neck, initial encounter: Secondary | ICD-10-CM | POA: Insufficient documentation

## 2015-12-20 DIAGNOSIS — N644 Mastodynia: Secondary | ICD-10-CM

## 2015-12-20 LAB — CBC
HEMATOCRIT: 40.8 % (ref 36.0–46.0)
HEMOGLOBIN: 13 g/dL (ref 12.0–15.0)
MCH: 30.7 pg (ref 26.0–34.0)
MCHC: 31.9 g/dL (ref 30.0–36.0)
MCV: 96.2 fL (ref 78.0–100.0)
Platelets: 253 10*3/uL (ref 150–400)
RBC: 4.24 MIL/uL (ref 3.87–5.11)
RDW: 13.4 % (ref 11.5–15.5)
WBC: 14.1 10*3/uL — AB (ref 4.0–10.5)

## 2015-12-20 LAB — I-STAT CHEM 8, ED
BUN: 29 mg/dL — ABNORMAL HIGH (ref 6–20)
CHLORIDE: 99 mmol/L — AB (ref 101–111)
CREATININE: 0.7 mg/dL (ref 0.44–1.00)
Calcium, Ion: 1.19 mmol/L (ref 1.13–1.30)
GLUCOSE: 109 mg/dL — AB (ref 65–99)
HEMATOCRIT: 43 % (ref 36.0–46.0)
Hemoglobin: 14.6 g/dL (ref 12.0–15.0)
POTASSIUM: 4 mmol/L (ref 3.5–5.1)
Sodium: 140 mmol/L (ref 135–145)
TCO2: 29 mmol/L (ref 0–100)

## 2015-12-20 LAB — I-STAT TROPONIN, ED: Troponin i, poc: 0.01 ng/mL (ref 0.00–0.08)

## 2015-12-20 MED ORDER — OXYCODONE-ACETAMINOPHEN 5-325 MG PO TABS: 1.0000 | ORAL_TABLET | ORAL | Status: DC | PRN

## 2015-12-20 MED ORDER — METHOCARBAMOL 500 MG PO TABS
500.0000 mg | ORAL_TABLET | Freq: Once | ORAL | Status: AC
  Administered 2015-12-20: 500 mg via ORAL
  Filled 2015-12-20: qty 1

## 2015-12-20 MED ORDER — TIZANIDINE HCL 4 MG PO CAPS: 4.0000 mg | ORAL_CAPSULE | Freq: Three times a day (TID) | ORAL | Status: DC

## 2015-12-20 MED ORDER — DIAZEPAM 5 MG PO TABS
10.0000 mg | ORAL_TABLET | Freq: Once | ORAL | Status: AC
  Administered 2015-12-20: 10 mg via ORAL
  Filled 2015-12-20: qty 2

## 2015-12-20 MED ORDER — IOHEXOL 300 MG/ML  SOLN
100.0000 mL | Freq: Once | INTRAMUSCULAR | Status: AC | PRN
  Administered 2015-12-20: 100 mL via INTRAVENOUS

## 2015-12-20 MED ORDER — MORPHINE SULFATE (PF) 4 MG/ML IV SOLN
4.0000 mg | Freq: Once | INTRAVENOUS | Status: AC
Start: 2015-12-20 — End: 2015-12-20
  Administered 2015-12-20: 4 mg via INTRAVENOUS
  Filled 2015-12-20: qty 1

## 2015-12-20 MED ORDER — OXYCODONE-ACETAMINOPHEN 5-325 MG PO TABS
2.0000 | ORAL_TABLET | Freq: Once | ORAL | Status: AC
  Administered 2015-12-20: 2 via ORAL
  Filled 2015-12-20: qty 2
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
## 2015-12-20 NOTE — Discharge Instructions (Signed)
1. Medications: Percocet, Zanaflex, usual home medications 2. Treatment: rest, drink plenty of fluids, gentle stretching as discussed, alternate ice and heat 3. Follow Up: Please followup with your primary doctor in 2 days for discussion of your diagnoses and further evaluation after today's visit; if you do not have a primary care doctor use the resource guide provided to find one;  Return to the ER for worsening pain, difficulty walking, loss of bowel or bladder control, difficulty breathing, confusion, numbness or other concerning symptoms

## 2015-12-20 NOTE — ED Notes (Signed)
Patient transported to CT 

## 2015-12-20 NOTE — ED Notes (Signed)
Patient transported back from CT 
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
## 2015-12-21 ENCOUNTER — Encounter (HOSPITAL_COMMUNITY): Payer: Medicare Other

## 2015-12-25 ENCOUNTER — Telehealth (HOSPITAL_COMMUNITY): Payer: Self-pay | Admitting: Physical Therapy
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
## 2015-12-26 ENCOUNTER — Ambulatory Visit (HOSPITAL_COMMUNITY): Payer: Medicare Other

## 2015-12-28 ENCOUNTER — Encounter (HOSPITAL_COMMUNITY): Payer: Medicare Other | Admitting: Physical Therapy

## 2016-01-01 ENCOUNTER — Encounter (HOSPITAL_COMMUNITY): Payer: Medicare Other | Admitting: Physical Therapy

## 2016-01-15 ENCOUNTER — Encounter (HOSPITAL_COMMUNITY): Payer: Medicare Other | Admitting: Physical Therapy

## 2016-01-17 ENCOUNTER — Encounter (HOSPITAL_COMMUNITY): Payer: Medicare Other | Admitting: Physical Therapy

## 2016-01-22 ENCOUNTER — Encounter (HOSPITAL_COMMUNITY): Payer: Medicare Other | Admitting: Physical Therapy

## 2016-01-24 ENCOUNTER — Ambulatory Visit (HOSPITAL_COMMUNITY): Payer: Medicare Other | Attending: Orthopedic Surgery | Admitting: Physical Therapy

## 2016-01-24 ENCOUNTER — Encounter (HOSPITAL_COMMUNITY): Payer: Self-pay | Admitting: Physical Therapy

## 2016-01-24 ENCOUNTER — Encounter (HOSPITAL_COMMUNITY): Payer: Medicare Other | Admitting: Physical Therapy

## 2016-01-24 DIAGNOSIS — G729 Myopathy, unspecified: Secondary | ICD-10-CM | POA: Insufficient documentation

## 2016-01-24 DIAGNOSIS — M542 Cervicalgia: Secondary | ICD-10-CM | POA: Diagnosis not present

## 2016-01-24 DIAGNOSIS — R29898 Other symptoms and signs involving the musculoskeletal system: Secondary | ICD-10-CM | POA: Diagnosis present

## 2016-01-24 DIAGNOSIS — G8929 Other chronic pain: Secondary | ICD-10-CM | POA: Diagnosis present

## 2016-01-24 DIAGNOSIS — M6289 Other specified disorders of muscle: Secondary | ICD-10-CM
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
## 2016-01-24 NOTE — Patient Instructions (Signed)
  UPPER TRAP STRETCH - HAND ON HEAD  Begin by retracting your head back into a chin tuck position. Next, move your head towards one side with the help of hand.  2x30 sec holds. Perform every day.     First Rib Self Mobilization  Place a bedsheet or strap over the curve of the neck that is painful. Sit on the long end of the bedsheet with your opposite hip so that the sheet crosses over your back. Make sure your shoulders are relaxed. Pull down on the front of the sheet until a good tension is felt on rib (see picture). Exhale and hold that tension with your hands. Take a deep breath in without allowing the sheet to rise with the breath and exhale. Repeat x5.      CHIN TUCK - SUPINE  While lying on your back, tuck your chin towards your chest and press the back of your head into the table.  Maintain contact of head with the surface you are lying on the entire time. 2x10, every day.       Supine cervical rotation  Lie on your back, and gently turn your head one direction, then back to the center.  Repeat all one way, then begin turning the other direction. Repeat 2x10 every day within pain free range.

## 2016-01-29 ENCOUNTER — Ambulatory Visit (HOSPITAL_COMMUNITY): Payer: Medicare Other | Admitting: Physical Therapy

## 2016-01-29 DIAGNOSIS — M6289 Other specified disorders of muscle: Secondary | ICD-10-CM

## 2016-01-29 DIAGNOSIS — R29898 Other symptoms and signs involving the musculoskeletal system: Secondary | ICD-10-CM

## 2016-01-29 DIAGNOSIS — M542 Cervicalgia: Principal | ICD-10-CM

## 2016-01-29 DIAGNOSIS — G8929 Other chronic pain: Secondary | ICD-10-CM

## 2016-01-29 NOTE — Therapy (Signed)
[place], [place], [address] Phone: [phone]   Fax:  [phone]  Physical Therapy Treatment  Patient Details  Name: [name] MRN: [id number] Date of Birth: 11-16-1945 Referring Provider: Laroy Apple, MD  Encounter Date: 01/29/2016      PT End of Session - 01/29/16 1446    Visit Number 2   Number of Visits 8   Date for PT Re-Evaluation 02/21/16   Authorization Type BCBS Medicare   Authorization Time Period 01/24/16 to 02/21/16   Authorization - Visit Number 1   Authorization - Number of Visits 10   PT Start Time [id number]  pt arrived a few minutes late   PT Stop Time 1437   PT Time Calculation (min) 44 min   Activity Tolerance Patient tolerated treatment well   Behavior During Therapy [facility] for tasks assessed/performed      Past Medical History  Diagnosis Date  . Depression     Past Surgical History  Procedure Laterality Date  . Cesarean section    . Rotator cuff repair    . Ankle fracture surgery    . Breast lumpectomy    . Appendectomy      There were no vitals filed for this visit.  Visit Diagnosis:  Neck pain, chronic  Decreased range of motion of neck  Muscle stiffness      Subjective Assessment - 01/29/16 1354    Subjective Pt states she was very sore this weekend and was able to find some relief with her stretching, asper cream and massage from her husband. She did note she was able to drive with increased ROM this past weekend.    Pertinent History TIA (15+ years ago), R RTC repair, L ankle sx, L knee sx   Limitations Walking   How long can you sit comfortably? no limitations with pillows behind her   How long can you stand comfortably? limited due to her knee.   How long can you walk comfortably? Unable to walk more than 10 minutes    Patient Stated Goals No pain and be able to turn my neck all the way.    Currently in Pain? Yes   Pain Score 2   "I know it's there"   Pain  Location --  across upper trap region   Pain Orientation Mid   Pain Descriptors / Indicators Aching  "pulling"   Pain Type Chronic pain   Pain Radiating Towards none    Pain Onset More than a month ago   Pain Frequency Intermittent   Aggravating Factors  lifting large objects   Pain Relieving Factors medication, HEP, range of motion early in the morning.    Effect of Pain on Daily Activities unable to participate in full ADLs.                          [facility] Adult PT Treatment/Exercise - 01/29/16 0001    Exercises   Exercises Neck;Shoulder   Neck Exercises: Theraband   Scapula Retraction 10 reps;Red  x2   Scapula Retraction Limitations with hot pack applied to UT   Rows 10 reps;Red  x2 sets   Neck Exercises: Standing   Neck Retraction 10 reps   Neck Retraction Limitations supine   Shoulder Exercises: Stretch   Corner Stretch 3 reps;30 seconds   Manual Therapy   Manual Therapy Muscle Energy Technique;Soft tissue mobilization   Manual therapy comments performed separately from  all other interventions   Soft tissue mobilization B UT, sub occipital release   Muscle Energy Technique L first rib; pec stretch in supine                PT Education - 01/29/16 1444    Education provided Yes   Education Details reviewed and updated HEP with: pec stretch, scap depression and seated rows with red TB; demonstrated supine chin tucks for deep neck flexor activation   Person(s) Educated Patient   Methods Explanation;Demonstration;Handout   Comprehension Verbalized understanding;Returned demonstration;Tactile cues required          PT Short Term Goals - 01/24/16 1413    PT SHORT TERM GOAL #1   Title Pt will demonstrate independence with her HEP.   Time 2   Period Weeks   Status New   PT SHORT TERM GOAL #2   Title Pt will demonstrate decrease in pain evident by pain report of no greater than 5/10 at its worst during daily activity   Time 2   Period Weeks    Status New   PT SHORT TERM GOAL #3   Title Pt will demonstrate improved posture awareness evident by maintaining good posture throughout 50% of the session without cuing.    Time 2   Period Weeks   Status New           PT Long Term Goals - 01/24/16 1416    PT LONG TERM GOAL #1   Title Pt will demonstrate full, pain free cervical AROM to increase her safety and ability to drive in the community.    Time 4   Period Weeks   Status New   PT LONG TERM GOAL #2   Title Pt will demonstrate improved UE strength evident by being able to pick up 10# without difficulty or increase in neck pain, 4/5 trials.    Time 4   Period Weeks   Status New   PT LONG TERM GOAL #3   Title Pt will demonstrate overall improvement in function and pain, evident by her ability to walk for atleast 2minutes without neck pain.    Baseline 01/24/16: walks 10 minutes    Time 4   Period Weeks   Status Achieved               Plan - 01/29/16 1447    Clinical Impression Statement Pt reporting improved cervical ROM and decreased stiffness after her next session. 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 She responded well to manual treatment and therex with decreased pain and improved muscle relaxation. Will continue current POC addressing her limitations as appropriate.     Pt will benefit from skilled therapeutic intervention in order to improve on the following deficits Decreased activity tolerance;Decreased balance;Decreased strength;Decreased mobility;Decreased range of motion;Dizziness;Increased muscle spasms;Impaired flexibility;Pain;Postural dysfunction;Impaired UE functional use   Rehab Potential Good   Clinical Impairments Affecting Rehab Potential Fear avoidance behavior   PT Frequency 2x / week   PT Duration 4 weeks   PT Treatment/Interventions ADLs/Self Care Home Management;Aquatic  Therapy;Biofeedback;Cryotherapy;Electrical Stimulation;Moist Heat;Iontophoresis 4mg /ml Dexamethasone;Ultrasound;Balance training;Therapeutic exercise;Functional mobility training;Therapeutic activities;Stair training;Neuromuscular re-education;Patient/family education;Manual techniques;Passive range of motion;Taping;Vestibular;Other (comment)  laser modality   PT Next Visit Plan Pt reports laser treatment was helpful last episode of neck pain. Continue to address soft tissue restrictions, flexibility and strength/endurance of cervical muscles.    PT Home Exercise Plan updated HEP: supine cervical AROM, seated UT stretch, 1st rib  self-mob, pec stretch against wall, seated scap depression and seated rows with red TB   Consulted and Agree with Plan of Care Patient        Problem List Patient Active Problem List   Diagnosis Date Noted  . Neck pain, chronic 01/13/2014  . Baker's cyst of knee 12/09/2013  . Stiffness of joint, not elsewhere classified, other specified site 03/02/2013  . Weakness of neck 03/02/2013    2:56 PM,01/29/2016 Elly Modena PT, DPT [facility] [place] [address] [place], [place], [address] Phone: [phone]   Fax:  [phone]  Name: [name] MRN: [id number] Date of Birth: 12-Aug-1946

## 2016-01-31 ENCOUNTER — Encounter (HOSPITAL_COMMUNITY): Payer: Medicare Other

## 2016-02-05 ENCOUNTER — Telehealth (HOSPITAL_COMMUNITY): Payer: Self-pay | Admitting: Physical Therapy

## 2016-02-05 ENCOUNTER — Encounter (HOSPITAL_COMMUNITY): Payer: Medicare Other | Admitting: Physical Therapy

## 2016-02-05 NOTE — Telephone Encounter (Signed)
No answer/LMOM: reminded pt of her next apt on 02/07/16 and encouraged her to call if any changes needed to be made.    11:21 AM,02/05/2016 [place] [phone]

## 2016-02-07 ENCOUNTER — Ambulatory Visit (HOSPITAL_COMMUNITY): Payer: Medicare Other | Admitting: Physical Therapy

## 2016-02-07 DIAGNOSIS — G8929 Other chronic pain: Secondary | ICD-10-CM

## 2016-02-07 DIAGNOSIS — M542 Cervicalgia: Secondary | ICD-10-CM | POA: Diagnosis not present

## 2016-02-07 DIAGNOSIS — M6289 Other specified disorders of muscle: Secondary | ICD-10-CM

## 2016-02-07 DIAGNOSIS — R29898 Other symptoms and signs involving the musculoskeletal system: Secondary | ICD-10-CM

## 2016-02-07 NOTE — Therapy (Signed)
[place], [place], [address] Phone: [phone]   Fax:  [phone]  Physical Therapy Treatment  Patient Details  Name: [name] MRN: [id number] Date of Birth: 01-22-1946 Referring Provider: Laroy Apple, MD  Encounter Date: 02/07/2016      PT End of Session - 02/07/16 1352    Visit Number 3   Number of Visits 8   Date for PT Re-Evaluation 02/21/16   Authorization Type BCBS Medicare   Authorization Time Period 01/24/16 to 02/21/16   Authorization - Visit Number 2   Authorization - Number of Visits 10   PT Start Time 1300   PT Stop Time 1345   PT Time Calculation (min) 45 min   Activity Tolerance Patient tolerated treatment well   Behavior During Therapy [facility] for tasks assessed/performed      Past Medical History  Diagnosis Date  . Depression     Past Surgical History  Procedure Laterality Date  . Cesarean section    . Rotator cuff repair    . Ankle fracture surgery    . Breast lumpectomy    . Appendectomy      There were no vitals filed for this visit.  Visit Diagnosis:  Neck pain, chronic  Decreased range of motion of neck  Muscle stiffness      Subjective Assessment - 02/07/16 1303    Subjective Pt states she was out of town when she missed her last session and didn't realize she had an appointment. She says he R upper trap region was really sore last night. She tried the massage, ice and asper cream without much relief. She feels she is somewhat better today but not perfect.    Pertinent History TIA (15+ years ago), R RTC repair, L ankle sx, L knee sx   Limitations Walking   How long can you sit comfortably? no limitations with pillows behind her   How long can you stand comfortably? limited due to her knee.   How long can you walk comfortably? Unable to walk more than 10 minutes    Patient Stated Goals No pain and be able to turn my neck all the way.    Currently in Pain? Yes   Pain Score 4    Pain Location --  Rt upper trap region   Pain Orientation Right   Pain Descriptors / Indicators Aching   Pain Type Chronic pain   Pain Radiating Towards none    Pain Onset More than a month ago   Pain Frequency Intermittent   Aggravating Factors  lifting and looking to the L                         [facility] Adult PT Treatment/Exercise - 02/07/16 0001    Neck Exercises: Theraband   Scapula Retraction Green  2x10   Scapula Retraction Limitations standing (+) shoulder shrug noted, corrected with tactile cues   Neck Exercises: Seated   Neck Retraction 20 reps;3 secs  (+) neck extension noted with verbal cues to correct   Shoulder Exercises: Stretch   Corner Stretch 3 reps;30 seconds   Manual Therapy   Manual Therapy Soft tissue mobilization;Muscle Energy Technique;Passive ROM   Manual therapy comments performed separately from all other interventions   Soft tissue mobilization B UT, sub occipital release   Passive ROM R upper trap stretch in supine    Muscle Energy Technique R first rib mob using MET  PT Education - 02/07/16 1348    Education provided Yes   Education Details reviewed and corrected HEP technique; reviewed chin tucks in supine and seated    Person(s) Educated Patient   Methods Explanation;Demonstration   Comprehension Verbalized understanding;Need further instruction;Verbal cues required          PT Short Term Goals - 01/24/16 1413    PT SHORT TERM GOAL #1   Title Pt will demonstrate independence with her HEP.   Time 2   Period Weeks   Status New   PT SHORT TERM GOAL #2   Title Pt will demonstrate decrease in pain evident by pain report of no greater than 5/10 at its worst during daily activity   Time 2   Period Weeks   Status New   PT SHORT TERM GOAL #3   Title Pt will demonstrate improved posture awareness evident by maintaining good posture throughout 50% of the session without cuing.    Time 2    Period Weeks   Status New           PT Long Term Goals - 01/24/16 1416    PT LONG TERM GOAL #1   Title Pt will demonstrate full, pain free cervical AROM to increase her safety and ability to drive in the community.    Time 4   Period Weeks   Status New   PT LONG TERM GOAL #2   Title Pt will demonstrate improved UE strength evident by being able to pick up 10# without difficulty or increase in neck pain, 4/5 trials.    Time 4   Period Weeks   Status New   PT LONG TERM GOAL #3   Title Pt will demonstrate overall improvement in function and pain, evident by her ability to walk for atleast 87mnutes without neck pain.    Baseline 01/24/16: walks 10 minutes    Time 4   Period Weeks   Status Achieved               Plan - 02/07/16 1355    Clinical Impression Statement Pt noting increased soreness in her Rt UT this evening which she attributes to being extra weak this past weekend. She continues to demonstrate soft tissue restrictions throughout her upper trap and sub occipital region secondary to poor posture which are causing pain and limiting movement. Manual treatment provided some relief by the end of today's session. Will continue current POC addressing limited cervical strength, ROM and soft tissue restrictions.   Pt will benefit from skilled therapeutic intervention in order to improve on the following deficits Decreased activity tolerance;Decreased balance;Decreased strength;Decreased mobility;Decreased range of motion;Dizziness;Increased muscle spasms;Impaired flexibility;Pain;Postural dysfunction;Impaired UE functional use   Rehab Potential Good   Clinical Impairments Affecting Rehab Potential Fear avoidance behavior   PT Frequency 2x / week   PT Duration 4 weeks   PT Treatment/Interventions ADLs/Self Care Home Management;Aquatic Therapy;Biofeedback;Cryotherapy;Electrical Stimulation;Moist Heat;Iontophoresis '4mg'$ /ml Dexamethasone;Ultrasound;Balance training;Therapeutic  exercise;Functional mobility training;Therapeutic activities;Stair training;Neuromuscular re-education;Patient/family education;Manual techniques;Passive range of motion;Taping;Vestibular;Other (comment)  laser modality   PT Next Visit Plan Assess upper cervical rotation and manual treatment if needed for improved motion/pain relief; focus on postural exercise and chest stretches   PT Home Exercise Plan updated HEP: supine cervical AROM, seated UT stretch, 1st rib self-mob, pec stretch against wall, seated scap depression and seated rows with red TB   Consulted and Agree with Plan of Care Patient        Problem List Patient Active Problem List  Diagnosis Date Noted  . Neck pain, chronic 01/13/2014  . Baker's cyst of knee 12/09/2013  . Stiffness of joint, not elsewhere classified, other specified site 03/02/2013  . Weakness of neck 03/02/2013    2:12 PM,02/07/2016 Elly Modena PT, DPT [facility] [place] [address] [place], [place], [address] Phone: [phone]   Fax:  [phone]  Name: [name] MRN: [id number] Date of Birth: 14-Nov-1945

## 2016-02-13 ENCOUNTER — Telehealth (HOSPITAL_COMMUNITY): Payer: Self-pay | Admitting: Physical Therapy

## 2016-02-13 ENCOUNTER — Ambulatory Visit (HOSPITAL_COMMUNITY): Payer: Medicare Other | Attending: Orthopedic Surgery | Admitting: Physical Therapy

## 2016-02-13 DIAGNOSIS — R293 Abnormal posture: Secondary | ICD-10-CM | POA: Diagnosis present

## 2016-02-13 DIAGNOSIS — R29898 Other symptoms and signs involving the musculoskeletal system: Secondary | ICD-10-CM | POA: Insufficient documentation

## 2016-02-13 DIAGNOSIS — G8929 Other chronic pain: Secondary | ICD-10-CM | POA: Diagnosis present

## 2016-02-13 DIAGNOSIS — M6281 Muscle weakness (generalized): Secondary | ICD-10-CM | POA: Diagnosis present

## 2016-02-13 DIAGNOSIS — M542 Cervicalgia: Secondary | ICD-10-CM | POA: Insufficient documentation

## 2016-02-13 DIAGNOSIS — R252 Cramp and spasm: Secondary | ICD-10-CM | POA: Insufficient documentation

## 2016-02-13 DIAGNOSIS — G729 Myopathy, unspecified: Secondary | ICD-10-CM | POA: Insufficient documentation

## 2016-02-13 DIAGNOSIS — M6289 Other specified disorders of muscle: Secondary | ICD-10-CM

## 2016-02-13 NOTE — Telephone Encounter (Signed)
Called pt to see if she could make appointment today at 2:30 instead of 3:15.  Rayetta Humphrey, [place] CLT [phone]

## 2016-02-13 NOTE — Therapy (Signed)
[place], [place], [address] Phone: [phone]   Fax:  [phone]  Physical Therapy Treatment  Patient Details  Name: [name] MRN: [id number] Date of Birth: [birth year] Referring Provider: Laroy Apple, MD  Encounter Date: 02/13/2016      PT End of Session - 02/13/16 1517    Visit Number 4   Number of Visits 8   Authorization Type BCBS Medicare   Authorization Time Period 01/24/16 to 02/21/16   Authorization - Visit Number 4   Authorization - Number of Visits 8   PT Start Time [id number] Pt late for appointment    PT Stop Time 1520   PT Time Calculation (min) 37 min   Activity Tolerance Patient tolerated treatment well      Past Medical History  Diagnosis Date  . Depression     Past Surgical History  Procedure Laterality Date  . Cesarean section    . Rotator cuff repair    . Ankle fracture surgery    . Breast lumpectomy    . Appendectomy      There were no vitals filed for this visit.  Visit Diagnosis:  Neck pain, chronic  Decreased range of motion of neck  Muscle stiffness      Subjective Assessment - 02/13/16 1506    Subjective Pt states that she has been doing pretty good with her pain level.   Currently in Pain? Yes   Pain Score 4    Pain Location Neck   Pain Orientation Right;Left   Pain Descriptors / Indicators Throbbing   Pain Type Chronic pain   Pain Onset More than a month ago   Aggravating Factors  lifting    Pain Relieving Factors exercises, medication                          [facility] Adult PT Treatment/Exercise - 02/13/16 0001    Exercises   Exercises Neck   Neck Exercises: Theraband   Scapula Retraction 10 reps   Rows 10 reps   Other Theraband Exercises punch downs x 10    Neck Exercises: Seated   Neck Retraction 10 reps   Cervical Rotation Both;10 reps   Other Seated Exercise cervical retraction    Modalities   Modalities Moist Heat   Moist  Heat Therapy   Number Minutes Moist Heat 10 Minutes   Moist Heat Location Cervical   Manual Therapy   Manual Therapy Soft tissue mobilization   Manual therapy comments performed separately from all other interventions   Soft tissue mobilization B UT, sub occipital release   Passive ROM R upper trap stretch in supine                   PT Short Term Goals - 01/24/16 1413    PT SHORT TERM GOAL #1   Title Pt will demonstrate independence with her HEP.   Time 2   Period Weeks   Status New   PT SHORT TERM GOAL #2   Title Pt will demonstrate decrease in pain evident by pain report of no greater than 5/10 at its worst during daily activity   Time 2   Period Weeks   Status New   PT SHORT TERM GOAL #3   Title Pt will demonstrate improved posture awareness evident by maintaining good posture throughout 50% of the session without cuing.    Time 2   Period Weeks  Status New           PT Long Term Goals - 01/24/16 1416    PT LONG TERM GOAL #1   Title Pt will demonstrate full, pain free cervical AROM to increase her safety and ability to drive in the community.    Time 4   Period Weeks   Status New   PT LONG TERM GOAL #2   Title Pt will demonstrate improved UE strength evident by being able to pick up 10# without difficulty or increase in neck pain, 4/5 trials.    Time 4   Period Weeks   Status New   PT LONG TERM GOAL #3   Title Pt will demonstrate overall improvement in function and pain, evident by her ability to walk for atleast 38minutes without neck pain.    Baseline 01/24/16: walks 10 minutes    Time 4   Period Weeks   Status Achieved               Plan - 02/13/16 1518    Clinical Impression Statement Pt continues to have decreased cervical ROM with moderate tio severe mm spasms in B upper trape.  Manual was able to subside but not obliterate spasm.  Pt will benefit from continued skilled therapy to progress to functional strengthening.    PT Next Visit  Plan Pt to begin x to v and w back exercises         Problem List Patient Active Problem List   Diagnosis Date Noted  . Neck pain, chronic 01/13/2014  . Baker's cyst of knee 12/09/2013  . Stiffness of joint, not elsewhere classified, other specified site 03/02/2013  . Weakness of neck 03/02/2013    Rayetta Humphrey, PT CLT [phone] 02/13/2016, 3:21 PM  [place] [address] [place], [place], [address] Phone: [phone]   Fax:  [phone]  Name: [name] MRN: [id number] Date of Birth: 05/20/1946

## 2016-02-15 ENCOUNTER — Ambulatory Visit (HOSPITAL_COMMUNITY): Payer: Medicare Other | Admitting: Physical Therapy

## 2016-02-15 DIAGNOSIS — M542 Cervicalgia: Secondary | ICD-10-CM

## 2016-02-15 DIAGNOSIS — M6281 Muscle weakness (generalized): Secondary | ICD-10-CM

## 2016-02-15 DIAGNOSIS — R293 Abnormal posture: Secondary | ICD-10-CM

## 2016-02-15 DIAGNOSIS — R252 Cramp and spasm: Secondary | ICD-10-CM

## 2016-02-15 NOTE — Addendum Note (Signed)
Addended by: Leeroy Cha on: 02/15/2016 01:48 PM   Modules accepted: Orders

## 2016-02-15 NOTE — Therapy (Addendum)
[place], [place], [address] Phone: [phone]   Fax:  [phone]  Physical Therapy Treatment  Patient Details  Name: [name] MRN: [id number] Date of Birth: 08-May-1946 Referring Provider: Laroy Apple, MD  Encounter Date: 02/15/2016      PT End of Session - 02/15/16 1017    Visit Number 5   Number of Visits 8   Date for PT Re-Evaluation 02/21/16   Authorization Type BCBS Medicare   Authorization Time Period 01/24/16 to 02/21/16   Authorization - Visit Number 5   Authorization - Number of Visits 8   PT Start Time 0935   PT Stop Time 1020   PT Time Calculation (min) 45 min   Activity Tolerance Patient tolerated treatment well      Past Medical History  Diagnosis Date  . Depression     Past Surgical History  Procedure Laterality Date  . Cesarean section    . Rotator cuff repair    . Ankle fracture surgery    . Breast lumpectomy    . Appendectomy      There were no vitals filed for this visit.  Visit Diagnosis:  Cramp and spasm  Cervicalgia  Abnormal posture  Muscle weakness (generalized)      Subjective Assessment - 02/15/16 0935    Subjective Pt can tell that her neck motion is better but not quite normal. Pt neck is feeling better but today she is sore in her back.  Pt is able to lift 3 plates into cabinet now instead of one at a time.    Pertinent History TIA (15+ years ago), R RTC repair, L ankle sx, L knee sx   Currently in Pain? Yes   Pain Score 2    Pain Location Neck   Pain Orientation Right;Left   Pain Descriptors / Indicators Throbbing   Pain Type Chronic pain   Pain Onset More than a month ago   Pain Frequency Intermittent   Aggravating Factors  lifting    Pain Relieving Factors exercise medication                          [facility] Adult PT Treatment/Exercise - 02/15/16 0001    Exercises   Exercises Neck   Neck Exercises: Theraband   Scapula  Retraction 10 reps;Green   Shoulder Extension 10 reps;Green   Rows 10 reps;Green   Neck Exercises: Seated   Cervical Isometrics Extension;Right lateral flexion;Left lateral flexion;3 secs;10 reps   Neck Retraction 10 reps   X to V 10 reps   Other Seated Exercise scapular retraction x 10    Other Seated Exercise upper trap stretch x 10   Neck Exercises: Supine   Shoulder Flexion Both;10 reps   Other Supine Exercise cervical retraction x 10    Neck Exercises: Sidelying   Lateral Flexion Both;10 reps   Modalities   Modalities --  Laser: goggles used; Chronic mm 1.24 min at 60 J/cm2 ; 3 treatment to right paraspinal mm                 PT Education - 02/15/16 1028    Education provided Yes   Education Details Theraband postural exercises.  Pt given sheet and t-band    Person(s) Educated Patient   Methods Explanation;Demonstration;Verbal cues;Handout   Comprehension Verbalized understanding;Returned demonstration          PT Short Term Goals - 01/24/16 1413  PT SHORT TERM GOAL #1   Title Pt will demonstrate independence with her HEP.   Time 2   Period Weeks   Status New   PT SHORT TERM GOAL #2   Title Pt will demonstrate decrease in pain evident by pain report of no greater than 5/10 at its worst during daily activity   Time 2   Period Weeks   Status New   PT SHORT TERM GOAL #3   Title Pt will demonstrate improved posture awareness evident by maintaining good posture throughout 50% of the session without cuing.    Time 2   Period Weeks   Status New           PT Long Term Goals - 01/24/16 1416    PT LONG TERM GOAL #1   Title Pt will demonstrate full, pain free cervical AROM to increase her safety and ability to drive in the community.    Time 4   Period Weeks   Status New   PT LONG TERM GOAL #2   Title Pt will demonstrate improved UE strength evident by being able to pick up 10# without difficulty or increase in neck pain, 4/5 trials.    Time 4    Period Weeks   Status New   PT LONG TERM GOAL #3   Title Pt will demonstrate overall improvement in function and pain, evident by her ability to walk for atleast 38minutes without neck pain.    Baseline 01/24/16: walks 10 minutes    Time 4   Period Weeks   Status Achieved               Plan - 02/15/16 1339    Clinical Impression Statement Pt instructed in postrual exercises as well as sidelying strengthening and supine stabiization exercises with therapist faciliation for proper technique.  Added laser,(MD has modalities as needed in order) to try and decrease pain.    Pt will benefit from skilled therapeutic intervention in order to improve on the following deficits Decreased activity tolerance;Decreased balance;Decreased strength;Decreased mobility;Decreased range of motion;Dizziness;Increased muscle spasms;Impaired flexibility;Pain;Postural dysfunction;Impaired UE functional use   Rehab Potential Good   Clinical Impairments Affecting Rehab Potential Fear avoidance behavior   PT Frequency 2x / week   PT Duration 6 weeks  additional 2 weeks   PT Treatment/Interventions ADLs/Self Care Home Management;Aquatic Therapy;Biofeedback;Cryotherapy;Electrical Stimulation;Moist Heat;Iontophoresis 4mg /ml Dexamethasone;Ultrasound;Balance training;Therapeutic exercise;Functional mobility training;Therapeutic activities;Stair training;Neuromuscular re-education;Patient/family education;Manual techniques;Passive range of motion;Taping;Vestibular;Other (comment)   PT Next Visit Plan re assess patient; assess  how Laser affected pain level, Progress to standing cevical stabilization exercises as well as prone strengthing.    Consulted and Agree with Plan of Care Patient        Problem List Patient Active Problem List   Diagnosis Date Noted  . Neck pain, chronic 01/13/2014  . Baker's cyst of knee 12/09/2013  . Stiffness of joint, not elsewhere classified, other specified site 03/02/2013  .  Weakness of neck 03/02/2013   Rayetta Humphrey, PT CLT [phone] 02/15/2016, 1:42 PM  [place] [address] [place], [place], [address] Phone: [phone]   Fax:  [phone]  Name: [name] MRN: [id number] Date of Birth: 09-Aug-1946

## 2016-02-20 ENCOUNTER — Encounter (HOSPITAL_COMMUNITY): Payer: Medicare Other | Admitting: Physical Therapy

## 2016-02-23 ENCOUNTER — Encounter (HOSPITAL_COMMUNITY): Payer: Medicare Other | Admitting: Physical Therapy

## 2016-02-27 ENCOUNTER — Ambulatory Visit (HOSPITAL_COMMUNITY): Payer: Medicare Other | Admitting: Physical Therapy

## 2016-02-27 DIAGNOSIS — R252 Cramp and spasm: Secondary | ICD-10-CM

## 2016-02-27 DIAGNOSIS — R293 Abnormal posture: Secondary | ICD-10-CM

## 2016-02-27 DIAGNOSIS — M6281 Muscle weakness (generalized): Secondary | ICD-10-CM

## 2016-02-27 DIAGNOSIS — M542 Cervicalgia: Secondary | ICD-10-CM | POA: Diagnosis not present

## 2016-02-27 NOTE — Therapy (Signed)
[place], [place], [address] Phone: [phone]   Fax:  [phone]  Physical Therapy Treatment/Reassessment  Patient Details  Name: [name] MRN: [id number] Date of Birth: 12-Jan-1946 Referring Provider: Laroy Apple, MD  Encounter Date: 02/27/2016      PT End of Session - 02/27/16 1123    Visit Number 6   Number of Visits 8   Date for PT Re-Evaluation 03/06/16   Authorization Type BCBS Medicare   Authorization Time Period 01/24/16 to 03/06/16   Authorization - Visit Number 6   Authorization - Number of Visits 8   PT Start Time 7564   PT Stop Time 1118   PT Time Calculation (min) 46 min   Activity Tolerance Patient tolerated treatment well   Behavior During Therapy [facility] for tasks assessed/performed      Past Medical History  Diagnosis Date  . Depression     Past Surgical History  Procedure Laterality Date  . Cesarean section    . Rotator cuff repair    . Ankle fracture surgery    . Breast lumpectomy    . Appendectomy      There were no vitals filed for this visit.      Subjective Assessment - 02/27/16 1035    Subjective Pt states she is doing good. She went out of town last week and did alot of walking. She was worn out by the end of the day. Her neck is a little sore today but not painful. She feels has made good improvements lately, but continues to have a knot in her upper trap region.   Pertinent History TIA (15+ years ago), R RTC repair, L ankle sx, L knee sx   Limitations --  RUE weakness   How long can you sit comfortably? unlimited if sitting in a reclined position, if sitting straight without much support 20 minutes   How long can you stand comfortably? Not limited by her neck   How long can you walk comfortably? walking fast: 30 min due to knee and neck   Patient Stated Goals No pain and be able to turn my neck all the way.    Currently in Pain? Yes   Pain Score 4    Pain  Location Neck   Pain Orientation Right   Pain Descriptors / Indicators Sore   Pain Radiating Towards none   Pain Onset More than a month ago   Pain Frequency Intermittent   Aggravating Factors  Neck rotation   Pain Relieving Factors asper cream    Effect of Pain on Daily Activities limited some with ADLs   Multiple Pain Sites No            [facility] PT Assessment - 02/27/16 0001    Assessment   Medical Diagnosis Rt>Lt cervicalgia   Referring Provider Laroy Apple, MD   Next MD Visit --  planning to go back after trial of PT    Prior Therapy yes   Precautions   Precautions None   Restrictions   Weight Bearing Restrictions No   Balance Screen   Has the patient fallen in the past 6 months No   Has the patient had a decrease in activity level because of a fear of falling?  No   Is the patient reluctant to leave their home because of a fear of falling?  No   Home Ecologist residence   Living Arrangements Spouse/significant other  Type of [place] to enter   Entrance Stairs-Number of Steps 2   Entrance Stairs-Rails None   Home Layout Two level;Able to live on main level with bedroom/bathroom   Alternate Level Stairs-Number of Steps 15   Alternate Level Stairs-Rails Right;Left   Prior Function   Level of Independence Independent   Vocation Retired   Leisure Enjoys doing crafts, yardwork   Observation/Other Assessments   Focus on Therapeutic Outcomes (FOTO)  --   Posture/Postural Control   Posture/Postural Control Postural limitations   Postural Limitations Rounded Shoulders;Forward head   AROM   Overall AROM  --  B shoulder ROM   Cervical Flexion 25 % limited with pulling pain along upper trap at end range   Cervical Extension Full   Cervical - Right Side Bend 40   Cervical - Left Side Bend 40  pain along R upper trap at end range   Cervical - Right Rotation 60  End range pain   Cervical - Left Rotation 60  end  range pain along R upper trap   Strength   Overall Strength Other (comment)  shoulder WFL    Cervical Flexion --  Pain noted with R sidebend resisted testing   Palpation   Palpation comment TTP along R upper trap, R levator scap, R scalenes; R first rib hypomobility noted                     [facility] Adult PT Treatment/Exercise - 02/27/16 0001    Manual Therapy   Manual Therapy Soft tissue mobilization   Manual therapy comments performed separately from all other interventions   Soft tissue mobilization STM and TrP release R UT, R scalene   Passive ROM cervical Lateral flexion, rotation x5 reps   Muscle Energy Technique Rt 1st rib   Neck Exercises: Stretches   Corner Stretch 2 reps;20 seconds                PT Education - 02/27/16 1125    Education provided Yes   Education Details Reviewed HEP and importance of HEP adherence    Person(s) Educated Patient   Methods Explanation;Demonstration   Comprehension Verbalized understanding;Returned demonstration;Need further instruction          PT Short Term Goals - 02/27/16 1048    PT SHORT TERM GOAL #1   Title Pt will demonstrate independence with her HEP.   Baseline 02/27/16: inconsistent with HEP, picking and choosing    Time 2   Period Weeks   Status Not Met   PT SHORT TERM GOAL #2   Title Pt will demonstrate decrease in pain evident by pain report of no greater than 5/10 at its worst during daily activity   Baseline 02/27/16: 7/10 at it's worst, and less frequent   Time 2   Period Weeks   Status Not Met   PT SHORT TERM GOAL #3   Title Pt will demonstrate improved posture awareness evident by maintaining good posture throughout 50% of the session without cuing.    Time 2   Period Weeks   Status New           PT Long Term Goals - 02/27/16 1050    PT LONG TERM GOAL #1   Title Pt will demonstrate full, pain free cervical AROM to increase her safety and ability to drive in the community.    Baseline  02/27/16: Post manual treatment cervical ROM improved to cervical rotation: 60  deg, lateral flexion: 45 deg   Time 4   Period Weeks   Status Partially Met   PT LONG TERM GOAL #2   Title Pt will demonstrate improved UE strength evident by being able to pick up 10# without difficulty or increase in neck pain, 4/5 trials.    Baseline 5/5 MMT   Time 4   Period Weeks   Status Achieved   PT LONG TERM GOAL #3   Title Pt will demonstrate overall improvement in function and pain, evident by her ability to walk for atleast 94mnutes without neck pain.    Baseline 01/24/16: walks 10 minutes 405/05/2016 30 minutes   Time 4   Period Weeks   Status Achieved               Plan - 02017/05/071126    Clinical Impression Statement Pt was reassessed this session and is progressing towards her goals. She demonstrates improved UE strength, mobility, cervical ROM and overall decreased intensity/frequency of reported cervical pain. She continues to present with full cervical PROM, however her AROM is limited due to continued trigger points and soft tissue restrictions throughout her Rt upper trap/scalene region. She is not consistently performing her HEP and therapist discussed importance of HEP adherence with correct technique to promote progress at this time, pt verbalized understanding. Today's session focused on manual treatment to address soft tissue restrictions and improve pain, with pt reporting decreased pain to 2/10 by the end of today's session. Will continue with current POC.    Rehab Potential Good   PT Frequency 2x / week   PT Duration 6 weeks  additional 2 weeks   PT Treatment/Interventions ADLs/Self Care Home Management;Aquatic Therapy;Biofeedback;Cryotherapy;Electrical Stimulation;Moist Heat;Iontophoresis 442mml Dexamethasone;Ultrasound;Balance training;Therapeutic exercise;Functional mobility training;Therapeutic activities;Stair training;Neuromuscular re-education;Patient/family education;Manual  techniques;Passive range of motion;Taping;Vestibular;Other (comment)   PT Next Visit Plan manual to address soft tissue restrictions, therex for improved posture   PT Home Exercise Plan no updated this session   Consulted and Agree with Plan of Care Patient      Patient will benefit from skilled therapeutic intervention in order to improve the following deficits and impairments:  Decreased activity tolerance, Decreased balance, Decreased strength, Decreased mobility, Decreased range of motion, Dizziness, Increased muscle spasms, Impaired flexibility, Pain, Postural dysfunction, Impaired UE functional use  Visit Diagnosis: Cramp and spasm  Cervicalgia  Abnormal posture  Muscle weakness (generalized)       G-Codes - 042017/05/07144    Functional Assessment Tool Used Clinical judgement based on assessment of pt pain report, ROM, strength and mobility.   Functional Limitation Mobility: Walking and moving around   Mobility: Walking and Moving Around Current Status ([phone] least 40 percent but less than 60 percent impaired, limited or restricted   Mobility: Walking and Moving Around Goal Status ([phone] least 20 percent but less than 40 percent impaired, limited or restricted      Problem List Patient Active Problem List   Diagnosis Date Noted  . Neck pain, chronic 01/13/2014  . Baker's cyst of knee 12/09/2013  . Stiffness of joint, not elsewhere classified, other specified site 03/02/2013  . Weakness of neck 03/02/2013    11:50 AM,04May 07, 2017aElly ModenaT, DPT [facility] [address] [place]one: [phone] Fax:  [phone]: Carmen SAINTILRN: [id number] of Birth: 1103-09-1946

## 2016-02-29 ENCOUNTER — Ambulatory Visit (HOSPITAL_COMMUNITY): Payer: Medicare Other | Admitting: Physical Therapy

## 2016-02-29 DIAGNOSIS — R293 Abnormal posture: Secondary | ICD-10-CM

## 2016-02-29 DIAGNOSIS — M6281 Muscle weakness (generalized): Secondary | ICD-10-CM

## 2016-02-29 DIAGNOSIS — M542 Cervicalgia: Secondary | ICD-10-CM

## 2016-02-29 NOTE — Therapy (Signed)
[name], [place], [address] Phone: [phone]   Fax:  [phone]  Physical Therapy Treatment  Patient Details  Name: [name] MRN: [id number] Date of Birth: July 21, 1946 Referring Provider: Laroy Apple, MD  Encounter Date: 02/29/2016      PT End of Session - 02/29/16 1435    Visit Number 7   Number of Visits 12   Date for PT Re-Evaluation 03/06/16   Authorization Type BCBS Medicare   Authorization Time Period 01/24/16 to 03/06/16   Authorization - Visit Number 7   Authorization - Number of Visits 12   PT Start Time 1350   PT Stop Time 1430   PT Time Calculation (min) 40 min      Past Medical History  Diagnosis Date  . Depression     Past Surgical History  Procedure Laterality Date  . Cesarean section    . Rotator cuff repair    . Ankle fracture surgery    . Breast lumpectomy    . Appendectomy      There were no vitals filed for this visit.      Subjective Assessment - 02/29/16 1353    Subjective [name] states that the only place she has pain is on the right side of her neck.  Pt has been doing her HEP.  Pt states that heat helps to decrease her pain    Currently in Pain? Yes   Pain Score 1    Pain Location Neck   Pain Orientation Right   Pain Descriptors / Indicators Sore   Pain Type Chronic pain   Pain Onset More than a month ago   Pain Frequency Intermittent   Aggravating Factors  rotation    Pain Relieving Factors heat                          [facility] Adult PT Treatment/Exercise - 02/29/16 0001    Exercises   Exercises Neck   Neck Exercises: Standing   Neck Retraction 10 reps   Wall Push Ups 10 reps   Other Standing Exercises chest and upper thoracic strertch x 10    Neck Exercises: Seated   Cervical Isometrics Extension;Right lateral flexion;Left lateral flexion;10 reps   Modalities   Modalities Ultrasound   Ultrasound   Ultrasound Location Rt upper  traoezuys    Ultrasound Parameters 1.3w/cm2 x 9'   Ultrasound Goals Pain   Manual Therapy   Manual Therapy Soft tissue mobilization   Manual therapy comments performed separately from all other interventions   Soft tissue mobilization STM and TrP release R UT, R scalene   Passive ROM cervical Lateral flexion, rotation x5 reps                  PT Short Term Goals - 02/27/16 1048    PT SHORT TERM GOAL #1   Title Pt will demonstrate independence with her HEP.   Baseline 02/27/16: inconsistent with HEP, picking and choosing    Time 2   Period Weeks   Status Not Met   PT SHORT TERM GOAL #2   Title Pt will demonstrate decrease in pain evident by pain report of no greater than 5/10 at its worst during daily activity   Baseline 02/27/16: 7/10 at it's worst, and less frequent   Time 2   Period Weeks   Status Not Met   PT SHORT TERM GOAL #3   Title  Pt will demonstrate improved posture awareness evident by maintaining good posture throughout 50% of the session without cuing.    Time 2   Period Weeks   Status New           PT Long Term Goals - 02/27/16 1050    PT LONG TERM GOAL #1   Title Pt will demonstrate full, pain free cervical AROM to increase her safety and ability to drive in the community.    Baseline 02/27/16: Post manual treatment cervical ROM improved to cervical rotation: 60 deg, lateral flexion: 45 deg   Time 4   Period Weeks   Status Partially Met   PT LONG TERM GOAL #2   Title Pt will demonstrate improved UE strength evident by being able to pick up 10# without difficulty or increase in neck pain, 4/5 trials.    Baseline 5/5 MMT   Time 4   Period Weeks   Status Achieved   PT LONG TERM GOAL #3   Title Pt will demonstrate overall improvement in function and pain, evident by her ability to walk for atleast 81mnutes without neck pain.    Baseline 01/24/16: walks 10 minutes 02/27/16: 30 minutes   Time 4   Period Weeks   Status Achieved                Plan - 02/29/16 1435    PT Next Visit Plan begin cervical stabilization for functional tasks       Patient will benefit from skilled therapeutic intervention in order to improve the following deficits and impairments:  Decreased activity tolerance, Decreased balance, Decreased strength, Decreased mobility, Decreased range of motion, Dizziness, Increased muscle spasms, Impaired flexibility, Pain, Postural dysfunction, Impaired UE functional use  Visit Diagnosis: Cervicalgia  Abnormal posture  Muscle weakness (generalized)     Problem List Patient Active Problem List   Diagnosis Date Noted  . Neck pain, chronic 01/13/2014  . Baker's cyst of knee 12/09/2013  . Stiffness of joint, not elsewhere classified, other specified site 03/02/2013  . Weakness of neck 03/02/2013    CRayetta Humphrey PT CLT [id number] 99244/20/2017, 2:38 PM  [address] [address]SSoutheast [place] [address]Phone: [phone]  Fax:  [phone] Name: SRHILEE CURRINMRN: [id number] of Birth: 101/21/1947

## 2016-03-05 ENCOUNTER — Ambulatory Visit (HOSPITAL_COMMUNITY): Payer: Medicare Other | Admitting: Physical Therapy

## 2016-03-05 DIAGNOSIS — M6281 Muscle weakness (generalized): Secondary | ICD-10-CM

## 2016-03-05 DIAGNOSIS — M542 Cervicalgia: Secondary | ICD-10-CM

## 2016-03-05 DIAGNOSIS — R252 Cramp and spasm: Secondary | ICD-10-CM

## 2016-03-05 DIAGNOSIS — R293 Abnormal posture: Secondary | ICD-10-CM

## 2016-03-05 NOTE — Therapy (Signed)
[place], [place], [address] Phone: [phone]   Fax:  [phone]  Physical Therapy Discharge  Patient Details  Name: [name] MRN: [id number] Date of Birth: 1946-07-22 Referring Provider: Laroy Apple, MD   Encounter Date: 03/05/2016      PT End of Session - 03/05/16 1722    Visit Number 8   Number of Visits 12   Date for PT Re-Evaluation 03/06/16   Authorization Type BCBS Medicare   Authorization Time Period 01/24/16 to 03/06/16   Authorization - Visit Number 8   Authorization - Number of Visits 12   PT Start Time 9179   PT Stop Time 1430   PT Time Calculation (min) 45 min   Activity Tolerance Patient tolerated treatment well   Behavior During Therapy [facility] for tasks assessed/performed      Past Medical History  Diagnosis Date  . Depression     Past Surgical History  Procedure Laterality Date  . Cesarean section    . Rotator cuff repair    . Ankle fracture surgery    . Breast lumpectomy    . Appendectomy      There were no vitals filed for this visit.      Subjective Assessment - 03/05/16 1353    Subjective Pt states she is doing good currently. Feels like the ultrasound with heat last session seemed to help and feels she has made alot of improvement since she first started coming. She is not currently in pain. She is planning on going back to her MD next week.   Pertinent History TIA (15+ years ago), R RTC repair, L ankle sx, L knee sx   Patient Stated Goals No pain and be able to turn my neck all the way.    Currently in Pain? No/denies            [facility] PT Assessment - 03/05/16 0001    Assessment   Medical Diagnosis Rt>Lt cervicalgia   Referring Provider Laroy Apple, MD    Next MD Visit --  planning to go back after trial of PT    Prior Therapy yes   Precautions   Precautions None   Restrictions   Weight Bearing Restrictions No   Balance Screen   Has the patient  fallen in the past 6 months No   Has the patient had a decrease in activity level because of a fear of falling?  No   Is the patient reluctant to leave their home because of a fear of falling?  No   Home Environment   Living Environment Private residence   Living Arrangements Spouse/significant other   Type of [place] to enter   Entrance Stairs-Number of Steps 2   Entrance Stairs-Rails None   Home Layout Two level;Able to live on main level with bedroom/bathroom   Alternate Level Stairs-Number of Steps 15   Alternate Level Stairs-Rails Right;Left   Prior Function   Level of Independence Independent   Vocation Retired   Leisure Enjoys doing crafts, Press photographer No significant limitations   Postural Limitations --   AROM   Overall AROM  --  B shoulder ROM   Right/Left Shoulder Right;Left  WFL   Cervical Flexion Full   Cervical Extension Full   Cervical - Right Side Bend 42   Cervical - Left Side Bend 43  pain along R upper trap at  end range   Cervical - Right Rotation 65  End range pain   Cervical - Left Rotation 70  end range pain along R upper trap   Strength   Overall Strength Other (comment)  shoulder WFL    Cervical Flexion --  Pain noted with R sidebend resisted testing   Palpation   Palpation comment TTP along R upper trap                     [facility] Adult PT Treatment/Exercise - 03/05/16 0001    Manual Therapy   Manual Therapy Soft tissue mobilization   Manual therapy comments performed separately from all other interventions   Soft tissue mobilization STM and TrP release R UT, R scalene   Neck Exercises: Stretches   Upper Trapezius Stretch 3 reps;30 seconds   Chest Stretch 3 reps;20 seconds                PT Education - 03/05/16 1722    Education provided Yes   Education Details discussed progress and goals met. Reviewed finalized HEP   Person(s) Educated  Patient   Methods Explanation;Demonstration   Comprehension Verbalized understanding;Returned demonstration          PT Short Term Goals - 03/05/16 1407    PT SHORT TERM GOAL #1   Title Pt will demonstrate independence with her HEP.   Baseline 02/27/16: inconsistent with HEP, picking and choosing    Time 2   Period Weeks   Status Achieved   PT SHORT TERM GOAL #2   Title Pt will demonstrate decrease in pain evident by pain report of no greater than 5/10 at its worst during daily activity   Baseline 02/27/16: 7/10 at it's worst, and less frequent;    Time 2   Period Weeks   Status Achieved   PT SHORT TERM GOAL #3   Title Pt will demonstrate improved posture awareness evident by maintaining good posture throughout 50% of the session without cuing.    Time 2   Period Weeks   Status Achieved           PT Long Term Goals - 03/05/16 1409    PT LONG TERM GOAL #1   Title Pt will demonstrate full, pain free cervical AROM to increase her safety and ability to drive in the community.    Baseline 02/27/16: Post manual treatment cervical ROM improved to cervical rotation: 60 deg, lateral flexion: 45 deg   Time 4   Period Weeks   Status Achieved   PT LONG TERM GOAL #2   Title Pt will demonstrate improved UE strength evident by being able to pick up 10# without difficulty or increase in neck pain, 4/5 trials.    Baseline 5/5 MMT   Time 4   Period Weeks   Status Achieved   PT LONG TERM GOAL #3   Title Pt will demonstrate overall improvement in function and pain, evident by her ability to walk for atleast 80mnutes without neck pain.    Baseline 01/24/16: walks 10 minutes 02/27/16: 30 minutes   Time 4   Period Weeks   Status Achieved               Plan - 03/05/16 1723    Clinical Impression Statement Pt was reassessed this session having met all of her short and long term goals since beginning therapy. She currently has minimal pain with decreased severity overall as well as  improved cervical AROM and decreased  trigger points throughout her cervical/shoulder region. She is now performing her HEP regularly without reported difficulty. She feels she has made significant improvement since beginng rehab several weeks ago. At this time, she is being discharged from PT with a finalized home management program and no longer requiring skilled PT services to address her impairments. Pt feels comfortable managing her program at home without PT assistance and plans to go back to her referring physician next week. Therapist reviewed final HEP and encouraged her to continue with her exercises for further improvement in residual pain.   Rehab Potential Good   PT Frequency 2x / week   PT Duration 6 weeks   PT Treatment/Interventions ADLs/Self Care Home Management;Aquatic Therapy;Biofeedback;Cryotherapy;Electrical Stimulation;Moist Heat;Iontophoresis 89m/ml Dexamethasone;Ultrasound;Balance training;Therapeutic exercise;Functional mobility training;Therapeutic activities;Stair training;Neuromuscular re-education;Patient/family education;Manual techniques;Passive range of motion;Taping;Vestibular;Other (comment)   PT Next Visit Plan d/c this visit   PT Home Exercise Plan see pt instruction   Recommended Other Services none   Consulted and Agree with Plan of Care Patient      Patient will benefit from skilled therapeutic intervention in order to improve the following deficits and impairments:  Decreased activity tolerance, Decreased balance, Decreased strength, Decreased mobility, Decreased range of motion, Dizziness, Increased muscle spasms, Impaired flexibility, Pain, Postural dysfunction, Impaired UE functional use  Visit Diagnosis: Cervicalgia  Abnormal posture  Muscle weakness (generalized)  Cramp and spasm       G-Codes - [id number]-May-[id number]    Functional Assessment Tool Used Clinical judgement based on assessment of pt pain report, ROM, strength and mobility.   Functional  Limitation Mobility: Walking and moving around   Mobility: Walking and Moving Around Current Status [phone] At least 1 percent but less than 20 percent impaired, limited or restricted   Mobility: Walking and Moving Around Goal Status [phone] At least 20 percent but less than 40 percent impaired, limited or restricted   Mobility: Walking and Moving Around Discharge Status [phone] At least 1 percent but less than 20 percent impaired, limited or restricted      Problem List Patient Active Problem List   Diagnosis Date Noted  . Neck pain, chronic 01/13/2014  . Baker's cyst of knee 12/09/2013  . Stiffness of joint, not elsewhere classified, other specified site 03/02/2013  . Weakness of neck 03/02/2013    PHYSICAL THERAPY DISCHARGE SUMMARY  Visits from Start of Care: 8  Current functional level related to goals / functional outcomes: Pt currently demonstrating cervical AROM/PROM WNL and improved pain report as well as improved shoulder strength.    Remaining deficits: Currently with minor trigger points noted along Rt upper trap which will continue to improve with provided home management program and heat/modalities   Education / Equipment: Updated home management program  Plan: Patient agrees to discharge.  Patient goals were met. Patient is being discharged due to meeting the stated rehab goals.  ?????       5:32 PM,005-17-2017SElly ModenaPT, DPT [facility]Out[facility] Therapy [address] DriveSGulf [address] [place] [address]Phone: [phone]  Fax:  [phone] Name: Carmen HEBNERMRN: [id number] of Birth: [birth year]

## 2016-03-05 NOTE — Patient Instructions (Signed)
  RETRACTION / CHIN TUCK  Slowly draw your head back so that your ears line up with your shoulders. repeat 15 times against wall for correct form.       UPPER TRAP STRETCH - HAND BEHIND BACK AND TOP OF HEAD  Begin by retracting your head back into a chin tuck position. Next, place one hand behind your back and gently draw your head towards the opposite side with the help of your other arm.  Hold 30 sec and repeat 3x on each side.      CORNER STRECH - W  While standing at a corner of a wall, place your arms on the walls in the shape of a "W" so that your elbows are bent and pointed towards the ground as shown. Take one step forward towards the corner. Bend your front knee until a stretch is felt along the front of your chest and/or shoulders. Your arms should be pointed downward towards the ground.  NOTE: Your legs should control the stretch by bending or straightening your front knee.     Hold 30 sec and repeat 3x       CORNER STRECH - LOW  While standing at a corner of a wall, place your arms downward along the walls as shown. Take one step forward towards the corner. Bend your front knee until a stretch is felt along the front of your chest and/or shoulders. Your arms should be pointed downward towards the ground.  NOTE: Your legs should control the stretch by bending or straightening your front knee.      Hold 30 sec and repeat 3x    BALL SEATED ROWS  While seated on an exercise ball, pull back on an elastic band in both arms as you bend your elbows.   Maintain erect posture the entire time.   2 sets of 10 reps    CERVICAL TOWEL ROTATION STRETCH  Hold the ends of a small folded bath towel and wrap it around your head and neck as shown. Place the towel on your face so as to minimize placing pressure on your jaw. Pressure should be placed on the side of your face/cheek bone.   Use your bottom most arm to anchor the towel in place. Use your top most arm to pull the towel  to cause a gentle rotational stretch in your neck. Hold, then return to starting position and repeat. Repeat 5 times and hold 5 sec

## 2016-08-27 DIAGNOSIS — R4189 Other symptoms and signs involving cognitive functions and awareness: Secondary | ICD-10-CM | POA: Insufficient documentation

## 2016-09-12 DIAGNOSIS — J3489 Other specified disorders of nose and nasal sinuses: Secondary | ICD-10-CM | POA: Insufficient documentation

## 2016-09-12 DIAGNOSIS — H9113 Presbycusis, bilateral: Secondary | ICD-10-CM | POA: Insufficient documentation

## 2016-11-13 DIAGNOSIS — H02839 Dermatochalasis of unspecified eye, unspecified eyelid: Secondary | ICD-10-CM | POA: Diagnosis not present

## 2016-11-25 DIAGNOSIS — M9903 Segmental and somatic dysfunction of lumbar region: Secondary | ICD-10-CM | POA: Diagnosis not present

## 2016-11-25 DIAGNOSIS — M545 Low back pain: Secondary | ICD-10-CM | POA: Diagnosis not present

## 2016-11-25 DIAGNOSIS — M9905 Segmental and somatic dysfunction of pelvic region: Secondary | ICD-10-CM | POA: Diagnosis not present

## 2016-11-25 DIAGNOSIS — M9902 Segmental and somatic dysfunction of thoracic region: Secondary | ICD-10-CM | POA: Diagnosis not present

## 2016-11-25 DIAGNOSIS — M546 Pain in thoracic spine: Secondary | ICD-10-CM | POA: Diagnosis not present

## 2016-12-02 DIAGNOSIS — F331 Major depressive disorder, recurrent, moderate: Secondary | ICD-10-CM | POA: Diagnosis not present

## 2016-12-03 DIAGNOSIS — M545 Low back pain: Secondary | ICD-10-CM | POA: Diagnosis not present

## 2016-12-03 DIAGNOSIS — M9902 Segmental and somatic dysfunction of thoracic region: Secondary | ICD-10-CM | POA: Diagnosis not present

## 2016-12-03 DIAGNOSIS — M9903 Segmental and somatic dysfunction of lumbar region: Secondary | ICD-10-CM | POA: Diagnosis not present

## 2016-12-03 DIAGNOSIS — M546 Pain in thoracic spine: Secondary | ICD-10-CM | POA: Diagnosis not present

## 2016-12-03 DIAGNOSIS — M9905 Segmental and somatic dysfunction of pelvic region: Secondary | ICD-10-CM | POA: Diagnosis not present

## 2016-12-04 DIAGNOSIS — L908 Other atrophic disorders of skin: Secondary | ICD-10-CM | POA: Diagnosis not present

## 2016-12-04 DIAGNOSIS — Z9841 Cataract extraction status, right eye: Secondary | ICD-10-CM | POA: Diagnosis not present

## 2016-12-04 DIAGNOSIS — H02402 Unspecified ptosis of left eyelid: Secondary | ICD-10-CM | POA: Diagnosis not present

## 2016-12-04 DIAGNOSIS — Z79899 Other long term (current) drug therapy: Secondary | ICD-10-CM | POA: Diagnosis not present

## 2016-12-04 DIAGNOSIS — H02403 Unspecified ptosis of bilateral eyelids: Secondary | ICD-10-CM | POA: Diagnosis not present

## 2016-12-04 DIAGNOSIS — H02831 Dermatochalasis of right upper eyelid: Secondary | ICD-10-CM | POA: Diagnosis not present

## 2016-12-04 DIAGNOSIS — Z8673 Personal history of transient ischemic attack (TIA), and cerebral infarction without residual deficits: Secondary | ICD-10-CM | POA: Diagnosis not present

## 2016-12-04 DIAGNOSIS — Z961 Presence of intraocular lens: Secondary | ICD-10-CM | POA: Diagnosis not present

## 2016-12-04 DIAGNOSIS — Z9842 Cataract extraction status, left eye: Secondary | ICD-10-CM | POA: Diagnosis not present

## 2016-12-04 DIAGNOSIS — Z882 Allergy status to sulfonamides status: Secondary | ICD-10-CM | POA: Diagnosis not present

## 2016-12-04 DIAGNOSIS — Z888 Allergy status to other drugs, medicaments and biological substances status: Secondary | ICD-10-CM | POA: Diagnosis not present

## 2016-12-04 DIAGNOSIS — H02834 Dermatochalasis of left upper eyelid: Secondary | ICD-10-CM | POA: Diagnosis not present

## 2016-12-13 ENCOUNTER — Ambulatory Visit (HOSPITAL_COMMUNITY)
Admission: RE | Admit: 2016-12-13 | Discharge: 2016-12-13 | Disposition: A | Discharge status: Home / Self Care | Payer: PPO | Source: Ambulatory Visit | Attending: Internal Medicine | Admitting: Internal Medicine

## 2016-12-13 ENCOUNTER — Other Ambulatory Visit (HOSPITAL_COMMUNITY): Payer: Self-pay | Admitting: Internal Medicine

## 2016-12-13 DIAGNOSIS — R059 Cough, unspecified: Secondary | ICD-10-CM

## 2016-12-13 DIAGNOSIS — R918 Other nonspecific abnormal finding of lung field: Secondary | ICD-10-CM | POA: Insufficient documentation

## 2016-12-13 DIAGNOSIS — R05 Cough: Secondary | ICD-10-CM | POA: Diagnosis not present

## 2016-12-13 DIAGNOSIS — J189 Pneumonia, unspecified organism: Secondary | ICD-10-CM | POA: Diagnosis not present

## 2016-12-13 DIAGNOSIS — Z6834 Body mass index (BMI) 34.0-34.9, adult: Secondary | ICD-10-CM | POA: Diagnosis not present

## 2017-01-07 DIAGNOSIS — M545 Low back pain: Secondary | ICD-10-CM | POA: Diagnosis not present

## 2017-01-07 DIAGNOSIS — M9903 Segmental and somatic dysfunction of lumbar region: Secondary | ICD-10-CM | POA: Diagnosis not present

## 2017-01-07 DIAGNOSIS — M9905 Segmental and somatic dysfunction of pelvic region: Secondary | ICD-10-CM | POA: Diagnosis not present

## 2017-01-07 DIAGNOSIS — M546 Pain in thoracic spine: Secondary | ICD-10-CM | POA: Diagnosis not present

## 2017-01-07 DIAGNOSIS — M9902 Segmental and somatic dysfunction of thoracic region: Secondary | ICD-10-CM | POA: Diagnosis not present

## 2017-01-13 DIAGNOSIS — R413 Other amnesia: Secondary | ICD-10-CM | POA: Diagnosis not present

## 2017-01-16 ENCOUNTER — Other Ambulatory Visit (HOSPITAL_COMMUNITY): Payer: Self-pay | Admitting: Internal Medicine

## 2017-01-16 ENCOUNTER — Ambulatory Visit (HOSPITAL_COMMUNITY)
Admission: RE | Admit: 2017-01-16 | Discharge: 2017-01-16 | Disposition: A | Discharge status: Home / Self Care | Payer: PPO | Source: Ambulatory Visit | Attending: Internal Medicine | Admitting: Internal Medicine

## 2017-01-16 DIAGNOSIS — R911 Solitary pulmonary nodule: Secondary | ICD-10-CM | POA: Insufficient documentation

## 2017-01-16 DIAGNOSIS — Z8701 Personal history of pneumonia (recurrent): Secondary | ICD-10-CM | POA: Diagnosis not present

## 2017-01-16 DIAGNOSIS — J189 Pneumonia, unspecified organism: Secondary | ICD-10-CM | POA: Diagnosis not present

## 2017-03-04 ENCOUNTER — Other Ambulatory Visit (HOSPITAL_COMMUNITY): Payer: Self-pay | Admitting: Internal Medicine

## 2017-03-04 DIAGNOSIS — R2242 Localized swelling, mass and lump, left lower limb: Secondary | ICD-10-CM | POA: Diagnosis not present

## 2017-03-04 DIAGNOSIS — M7989 Other specified soft tissue disorders: Secondary | ICD-10-CM

## 2017-03-05 ENCOUNTER — Ambulatory Visit (HOSPITAL_COMMUNITY)
Admission: RE | Admit: 2017-03-05 | Discharge: 2017-03-05 | Disposition: A | Discharge status: Home / Self Care | Payer: PPO | Source: Ambulatory Visit | Attending: Internal Medicine | Admitting: Internal Medicine

## 2017-03-05 DIAGNOSIS — M7989 Other specified soft tissue disorders: Secondary | ICD-10-CM | POA: Diagnosis not present

## 2017-03-11 DIAGNOSIS — L658 Other specified nonscarring hair loss: Secondary | ICD-10-CM | POA: Diagnosis not present

## 2017-03-11 DIAGNOSIS — L304 Erythema intertrigo: Secondary | ICD-10-CM | POA: Diagnosis not present

## 2017-03-11 DIAGNOSIS — B9689 Other specified bacterial agents as the cause of diseases classified elsewhere: Secondary | ICD-10-CM | POA: Diagnosis not present

## 2017-03-11 DIAGNOSIS — L0202 Furuncle of face: Secondary | ICD-10-CM | POA: Diagnosis not present

## 2017-03-11 DIAGNOSIS — L508 Other urticaria: Secondary | ICD-10-CM | POA: Diagnosis not present

## 2017-03-21 DIAGNOSIS — H903 Sensorineural hearing loss, bilateral: Secondary | ICD-10-CM | POA: Diagnosis not present

## 2017-03-21 DIAGNOSIS — H9313 Tinnitus, bilateral: Secondary | ICD-10-CM | POA: Diagnosis not present

## 2017-03-25 DIAGNOSIS — F331 Major depressive disorder, recurrent, moderate: Secondary | ICD-10-CM | POA: Diagnosis not present

## 2017-03-27 DIAGNOSIS — H02413 Mechanical ptosis of bilateral eyelids: Secondary | ICD-10-CM | POA: Diagnosis not present

## 2017-03-27 DIAGNOSIS — H02403 Unspecified ptosis of bilateral eyelids: Secondary | ICD-10-CM | POA: Diagnosis not present

## 2017-03-28 DIAGNOSIS — H02403 Unspecified ptosis of bilateral eyelids: Secondary | ICD-10-CM | POA: Insufficient documentation

## 2017-03-28 DIAGNOSIS — H02413 Mechanical ptosis of bilateral eyelids: Secondary | ICD-10-CM | POA: Insufficient documentation

## 2017-04-21 DIAGNOSIS — H833X3 Noise effects on inner ear, bilateral: Secondary | ICD-10-CM | POA: Diagnosis not present

## 2017-04-21 DIAGNOSIS — H918X2 Other specified hearing loss, left ear: Secondary | ICD-10-CM | POA: Diagnosis not present

## 2017-05-26 ENCOUNTER — Encounter: Payer: Self-pay | Admitting: Neurology

## 2017-05-27 ENCOUNTER — Ambulatory Visit (INDEPENDENT_AMBULATORY_CARE_PROVIDER_SITE_OTHER): Payer: PPO | Admitting: Neurology

## 2017-05-27 ENCOUNTER — Encounter: Payer: Self-pay | Admitting: Neurology

## 2017-05-27 VITALS — BP 125/87 | HR 68 | Ht 64.0 in | Wt 207.0 lb

## 2017-05-27 DIAGNOSIS — R419 Unspecified symptoms and signs involving cognitive functions and awareness: Secondary | ICD-10-CM

## 2017-05-27 DIAGNOSIS — F40240 Claustrophobia: Secondary | ICD-10-CM | POA: Diagnosis not present

## 2017-05-27 DIAGNOSIS — H9192 Unspecified hearing loss, left ear: Secondary | ICD-10-CM

## 2017-05-27 MED ORDER — ALPRAZOLAM 1 MG PO TABS: 1.0000 mg | ORAL_TABLET | Freq: Every evening | ORAL | 0 refills | Status: DC | PRN

## 2017-05-27 NOTE — Addendum Note (Signed)
Addended by: Larey Seat on: 05/27/2017 03:46 PM   Modules accepted: Orders
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
## 2017-05-28 LAB — COMPREHENSIVE METABOLIC PANEL
ALT: 20 IU/L (ref 0–32)
AST: 26 IU/L (ref 0–40)
Albumin/Globulin Ratio: 1.6 (ref 1.2–2.2)
Albumin: 4.5 g/dL (ref 3.6–4.8)
Alkaline Phosphatase: 69 IU/L (ref 39–117)
BUN/Creatinine Ratio: 23 (ref 12–28)
BUN: 15 mg/dL (ref 8–27)
Bilirubin Total: 0.4 mg/dL (ref 0.0–1.2)
CO2: 27 mmol/L (ref 20–29)
Calcium: 10.2 mg/dL (ref 8.7–10.3)
Chloride: 97 mmol/L (ref 96–106)
Creatinine, Ser: 0.66 mg/dL (ref 0.57–1.00)
GFR calc Af Amer: 104 mL/min/{1.73_m2} (ref >59)
GFR calc non Af Amer: 90 mL/min/{1.73_m2} (ref >59)
Globulin, Total: 2.9 g/dL (ref 1.5–4.5)
Glucose: 81 mg/dL (ref 65–99)
Potassium: 5 mmol/L (ref 3.5–5.2)
Sodium: 140 mmol/L (ref 134–144)
Total Protein: 7.4 g/dL (ref 6.0–8.5)

## 2017-06-04 ENCOUNTER — Telehealth: Payer: Self-pay | Admitting: Neurology

## 2017-06-04 DIAGNOSIS — Z01818 Encounter for other preprocedural examination: Secondary | ICD-10-CM | POA: Diagnosis not present

## 2017-06-04 DIAGNOSIS — J45909 Unspecified asthma, uncomplicated: Secondary | ICD-10-CM | POA: Diagnosis not present

## 2017-06-04 DIAGNOSIS — G43809 Other migraine, not intractable, without status migrainosus: Secondary | ICD-10-CM | POA: Diagnosis not present

## 2017-06-04 DIAGNOSIS — H905 Unspecified sensorineural hearing loss: Secondary | ICD-10-CM | POA: Diagnosis not present

## 2017-06-04 DIAGNOSIS — F329 Major depressive disorder, single episode, unspecified: Secondary | ICD-10-CM | POA: Diagnosis not present

## 2017-06-04 DIAGNOSIS — K219 Gastro-esophageal reflux disease without esophagitis: Secondary | ICD-10-CM | POA: Diagnosis not present

## 2017-06-04 NOTE — Telephone Encounter (Signed)
-----   Message from Larey Seat, MD sent at 06/04/2017  5:10 PM EDT ----- All normal metabolic panel.

## 2017-06-05 NOTE — Telephone Encounter (Signed)
Called and spoke with the pt and made her aware that her lab results were normal. Pt verbalized understanding and had no further questions.

## 2017-06-05 NOTE — Telephone Encounter (Signed)
-----   Message from Larey Seat, MD sent at 06/04/2017  5:10 PM EDT ----- All normal metabolic panel.

## 2017-06-09 ENCOUNTER — Telehealth: Payer: Self-pay | Admitting: Neurology

## 2017-06-09 ENCOUNTER — Ambulatory Visit
Admission: RE | Admit: 2017-06-09 | Discharge: 2017-06-09 | Disposition: A | Discharge status: Home / Self Care | Payer: PPO | Source: Ambulatory Visit | Attending: Neurology | Admitting: Neurology

## 2017-06-09 DIAGNOSIS — R419 Unspecified symptoms and signs involving cognitive functions and awareness: Secondary | ICD-10-CM | POA: Diagnosis not present

## 2017-06-09 DIAGNOSIS — F40240 Claustrophobia: Secondary | ICD-10-CM

## 2017-06-09 DIAGNOSIS — H9192 Unspecified hearing loss, left ear: Secondary | ICD-10-CM | POA: Diagnosis not present

## 2017-06-09 MED ORDER — GADOBENATE DIMEGLUMINE 529 MG/ML IV SOLN
20.0000 mL | Freq: Once | INTRAVENOUS | Status: AC | PRN
  Administered 2017-06-09: 20 mL via INTRAVENOUS

## 2017-06-09 NOTE — Telephone Encounter (Signed)
Spoke with the patient and gave clear instructions on taking the medication prior to MRI. I stated take 1 pill when she leaves her home as she has someone driving her. She has a 30 min drive if she gets to the office and don't feel as relaxed as she would like then she may take the 2nd one. Pt verbalized understanding

## 2017-06-09 NOTE — Telephone Encounter (Signed)
Pt called the office needing clarification on how to take the xanax today prior to MRI. Please call

## 2017-06-11 ENCOUNTER — Telehealth: Payer: Self-pay | Admitting: Neurology

## 2017-06-11 NOTE — Telephone Encounter (Signed)
-----   Message from Larey Seat, MD sent at 06/10/2017  6:21 PM EDT ----- Small vessel disease, which has progressed, when compared to previous images.  Cognitive decline can be associated. CD

## 2017-06-11 NOTE — Telephone Encounter (Signed)
Called and discussed the results with the patient. Pt would like it to be forwarded to Dr Warren Danes as her was the referring MD. I was able to do that for her. Pt verbalized understanding and had no further questions at this time.

## 2017-06-24 DIAGNOSIS — H903 Sensorineural hearing loss, bilateral: Secondary | ICD-10-CM | POA: Diagnosis not present

## 2017-06-24 DIAGNOSIS — G43919 Migraine, unspecified, intractable, without status migrainosus: Secondary | ICD-10-CM | POA: Diagnosis not present

## 2017-06-24 DIAGNOSIS — F0781 Postconcussional syndrome: Secondary | ICD-10-CM | POA: Diagnosis not present

## 2017-06-24 DIAGNOSIS — F329 Major depressive disorder, single episode, unspecified: Secondary | ICD-10-CM | POA: Diagnosis not present

## 2017-06-27 DIAGNOSIS — H02403 Unspecified ptosis of bilateral eyelids: Secondary | ICD-10-CM | POA: Diagnosis not present

## 2017-06-27 DIAGNOSIS — H9190 Unspecified hearing loss, unspecified ear: Secondary | ICD-10-CM | POA: Diagnosis not present

## 2017-06-27 DIAGNOSIS — H5340 Unspecified visual field defects: Secondary | ICD-10-CM | POA: Diagnosis not present

## 2017-06-27 DIAGNOSIS — Z79899 Other long term (current) drug therapy: Secondary | ICD-10-CM | POA: Diagnosis not present

## 2017-06-27 DIAGNOSIS — F329 Major depressive disorder, single episode, unspecified: Secondary | ICD-10-CM | POA: Diagnosis not present

## 2017-06-27 DIAGNOSIS — Z882 Allergy status to sulfonamides status: Secondary | ICD-10-CM | POA: Diagnosis not present

## 2017-06-27 DIAGNOSIS — H02124 Mechanical ectropion of left upper eyelid: Secondary | ICD-10-CM | POA: Diagnosis not present

## 2017-06-27 DIAGNOSIS — H02121 Mechanical ectropion of right upper eyelid: Secondary | ICD-10-CM | POA: Diagnosis not present

## 2017-06-27 DIAGNOSIS — H16213 Exposure keratoconjunctivitis, bilateral: Secondary | ICD-10-CM | POA: Diagnosis not present

## 2017-06-27 DIAGNOSIS — Z888 Allergy status to other drugs, medicaments and biological substances status: Secondary | ICD-10-CM | POA: Diagnosis not present

## 2017-06-27 DIAGNOSIS — H02413 Mechanical ptosis of bilateral eyelids: Secondary | ICD-10-CM | POA: Diagnosis not present

## 2017-07-31 DIAGNOSIS — Z1151 Encounter for screening for human papillomavirus (HPV): Secondary | ICD-10-CM | POA: Diagnosis not present

## 2017-07-31 DIAGNOSIS — Z01419 Encounter for gynecological examination (general) (routine) without abnormal findings: Secondary | ICD-10-CM | POA: Diagnosis not present

## 2017-07-31 DIAGNOSIS — Z124 Encounter for screening for malignant neoplasm of cervix: Secondary | ICD-10-CM | POA: Diagnosis not present

## 2017-07-31 DIAGNOSIS — Z1231 Encounter for screening mammogram for malignant neoplasm of breast: Secondary | ICD-10-CM | POA: Diagnosis not present

## 2017-08-07 DIAGNOSIS — F331 Major depressive disorder, recurrent, moderate: Secondary | ICD-10-CM | POA: Diagnosis not present

## 2017-08-12 DIAGNOSIS — D123 Benign neoplasm of transverse colon: Secondary | ICD-10-CM | POA: Diagnosis not present

## 2017-08-12 DIAGNOSIS — K5730 Diverticulosis of large intestine without perforation or abscess without bleeding: Secondary | ICD-10-CM | POA: Diagnosis not present

## 2017-08-12 DIAGNOSIS — Z1211 Encounter for screening for malignant neoplasm of colon: Secondary | ICD-10-CM | POA: Diagnosis not present

## 2017-08-12 DIAGNOSIS — D126 Benign neoplasm of colon, unspecified: Secondary | ICD-10-CM | POA: Diagnosis not present

## 2017-08-12 DIAGNOSIS — Z86010 Personal history of colonic polyps: Secondary | ICD-10-CM | POA: Diagnosis not present

## 2017-08-12 DIAGNOSIS — K648 Other hemorrhoids: Secondary | ICD-10-CM | POA: Diagnosis not present

## 2017-08-12 DIAGNOSIS — Z8371 Family history of colonic polyps: Secondary | ICD-10-CM | POA: Diagnosis not present

## 2017-08-29 DIAGNOSIS — G43909 Migraine, unspecified, not intractable, without status migrainosus: Secondary | ICD-10-CM | POA: Diagnosis not present

## 2017-09-02 ENCOUNTER — Ambulatory Visit: Payer: PPO | Admitting: Neurology

## 2017-10-28 DIAGNOSIS — Z9889 Other specified postprocedural states: Secondary | ICD-10-CM | POA: Diagnosis not present

## 2017-10-28 DIAGNOSIS — T8189XA Other complications of procedures, not elsewhere classified, initial encounter: Secondary | ICD-10-CM | POA: Diagnosis not present

## 2017-10-28 DIAGNOSIS — L988 Other specified disorders of the skin and subcutaneous tissue: Secondary | ICD-10-CM | POA: Diagnosis not present

## 2017-11-18 DIAGNOSIS — M47812 Spondylosis without myelopathy or radiculopathy, cervical region: Secondary | ICD-10-CM | POA: Diagnosis not present

## 2017-11-18 DIAGNOSIS — M791 Myalgia, unspecified site: Secondary | ICD-10-CM | POA: Diagnosis not present

## 2017-12-09 DIAGNOSIS — T8189XA Other complications of procedures, not elsewhere classified, initial encounter: Secondary | ICD-10-CM | POA: Diagnosis not present

## 2017-12-17 DIAGNOSIS — Z961 Presence of intraocular lens: Secondary | ICD-10-CM | POA: Diagnosis not present

## 2017-12-17 DIAGNOSIS — H52223 Regular astigmatism, bilateral: Secondary | ICD-10-CM | POA: Diagnosis not present

## 2017-12-17 DIAGNOSIS — H524 Presbyopia: Secondary | ICD-10-CM | POA: Diagnosis not present

## 2018-01-01 DIAGNOSIS — F0781 Postconcussional syndrome: Secondary | ICD-10-CM | POA: Diagnosis not present

## 2018-01-01 DIAGNOSIS — G4700 Insomnia, unspecified: Secondary | ICD-10-CM | POA: Diagnosis not present

## 2018-01-01 DIAGNOSIS — F4322 Adjustment disorder with anxiety: Secondary | ICD-10-CM | POA: Diagnosis not present

## 2018-01-01 DIAGNOSIS — H903 Sensorineural hearing loss, bilateral: Secondary | ICD-10-CM | POA: Diagnosis not present

## 2018-01-08 DIAGNOSIS — F331 Major depressive disorder, recurrent, moderate: Secondary | ICD-10-CM | POA: Diagnosis not present

## 2018-02-06 DIAGNOSIS — F331 Major depressive disorder, recurrent, moderate: Secondary | ICD-10-CM | POA: Diagnosis not present

## 2018-02-09 DIAGNOSIS — H903 Sensorineural hearing loss, bilateral: Secondary | ICD-10-CM | POA: Diagnosis not present

## 2018-02-20 DIAGNOSIS — M545 Low back pain: Secondary | ICD-10-CM | POA: Diagnosis not present

## 2018-02-20 DIAGNOSIS — M9902 Segmental and somatic dysfunction of thoracic region: Secondary | ICD-10-CM | POA: Diagnosis not present

## 2018-02-20 DIAGNOSIS — M9905 Segmental and somatic dysfunction of pelvic region: Secondary | ICD-10-CM | POA: Diagnosis not present

## 2018-02-20 DIAGNOSIS — M546 Pain in thoracic spine: Secondary | ICD-10-CM | POA: Diagnosis not present

## 2018-02-20 DIAGNOSIS — M9903 Segmental and somatic dysfunction of lumbar region: Secondary | ICD-10-CM | POA: Diagnosis not present

## 2018-03-26 DIAGNOSIS — M9905 Segmental and somatic dysfunction of pelvic region: Secondary | ICD-10-CM | POA: Diagnosis not present

## 2018-03-26 DIAGNOSIS — M9902 Segmental and somatic dysfunction of thoracic region: Secondary | ICD-10-CM | POA: Diagnosis not present

## 2018-03-26 DIAGNOSIS — M545 Low back pain: Secondary | ICD-10-CM | POA: Diagnosis not present

## 2018-03-26 DIAGNOSIS — M546 Pain in thoracic spine: Secondary | ICD-10-CM | POA: Diagnosis not present

## 2018-03-26 DIAGNOSIS — M9903 Segmental and somatic dysfunction of lumbar region: Secondary | ICD-10-CM | POA: Diagnosis not present

## 2018-03-30 ENCOUNTER — Ambulatory Visit (HOSPITAL_COMMUNITY)
Admission: RE | Admit: 2018-03-30 | Discharge: 2018-03-30 | Disposition: A | Discharge status: Home / Self Care | Payer: PPO | Source: Ambulatory Visit | Attending: Internal Medicine | Admitting: Internal Medicine

## 2018-03-30 ENCOUNTER — Other Ambulatory Visit (HOSPITAL_COMMUNITY): Payer: Self-pay | Admitting: Internal Medicine

## 2018-03-30 DIAGNOSIS — R2242 Localized swelling, mass and lump, left lower limb: Secondary | ICD-10-CM | POA: Diagnosis not present

## 2018-03-30 DIAGNOSIS — M25562 Pain in left knee: Secondary | ICD-10-CM | POA: Diagnosis not present

## 2018-03-30 DIAGNOSIS — I82402 Acute embolism and thrombosis of unspecified deep veins of left lower extremity: Secondary | ICD-10-CM

## 2018-03-30 DIAGNOSIS — Z6836 Body mass index (BMI) 36.0-36.9, adult: Secondary | ICD-10-CM | POA: Diagnosis not present

## 2018-03-30 DIAGNOSIS — R600 Localized edema: Secondary | ICD-10-CM | POA: Diagnosis not present

## 2018-03-30 DIAGNOSIS — M7122 Synovial cyst of popliteal space [Baker], left knee: Secondary | ICD-10-CM

## 2018-04-01 DIAGNOSIS — M9902 Segmental and somatic dysfunction of thoracic region: Secondary | ICD-10-CM | POA: Diagnosis not present

## 2018-04-01 DIAGNOSIS — M9903 Segmental and somatic dysfunction of lumbar region: Secondary | ICD-10-CM | POA: Diagnosis not present

## 2018-04-01 DIAGNOSIS — M545 Low back pain: Secondary | ICD-10-CM | POA: Diagnosis not present

## 2018-04-01 DIAGNOSIS — M546 Pain in thoracic spine: Secondary | ICD-10-CM | POA: Diagnosis not present

## 2018-04-01 DIAGNOSIS — M9905 Segmental and somatic dysfunction of pelvic region: Secondary | ICD-10-CM | POA: Diagnosis not present

## 2018-04-29 DIAGNOSIS — F331 Major depressive disorder, recurrent, moderate: Secondary | ICD-10-CM | POA: Diagnosis not present

## 2018-07-24 DIAGNOSIS — M9905 Segmental and somatic dysfunction of pelvic region: Secondary | ICD-10-CM | POA: Diagnosis not present

## 2018-07-24 DIAGNOSIS — M545 Low back pain: Secondary | ICD-10-CM | POA: Diagnosis not present

## 2018-07-24 DIAGNOSIS — M9903 Segmental and somatic dysfunction of lumbar region: Secondary | ICD-10-CM | POA: Diagnosis not present

## 2018-07-24 DIAGNOSIS — M9902 Segmental and somatic dysfunction of thoracic region: Secondary | ICD-10-CM | POA: Diagnosis not present

## 2018-07-24 DIAGNOSIS — M546 Pain in thoracic spine: Secondary | ICD-10-CM | POA: Diagnosis not present

## 2018-07-27 DIAGNOSIS — M9905 Segmental and somatic dysfunction of pelvic region: Secondary | ICD-10-CM | POA: Diagnosis not present

## 2018-07-27 DIAGNOSIS — M545 Low back pain: Secondary | ICD-10-CM | POA: Diagnosis not present

## 2018-07-27 DIAGNOSIS — M546 Pain in thoracic spine: Secondary | ICD-10-CM | POA: Diagnosis not present

## 2018-07-27 DIAGNOSIS — M9902 Segmental and somatic dysfunction of thoracic region: Secondary | ICD-10-CM | POA: Diagnosis not present

## 2018-07-27 DIAGNOSIS — M9903 Segmental and somatic dysfunction of lumbar region: Secondary | ICD-10-CM | POA: Diagnosis not present

## 2018-08-01 ENCOUNTER — Encounter: Payer: Self-pay | Admitting: Behavioral Health

## 2018-08-01 DIAGNOSIS — E785 Hyperlipidemia, unspecified: Secondary | ICD-10-CM | POA: Insufficient documentation

## 2018-08-01 DIAGNOSIS — Z8673 Personal history of transient ischemic attack (TIA), and cerebral infarction without residual deficits: Secondary | ICD-10-CM | POA: Insufficient documentation

## 2018-08-01 DIAGNOSIS — M1990 Unspecified osteoarthritis, unspecified site: Secondary | ICD-10-CM

## 2018-08-01 DIAGNOSIS — M25569 Pain in unspecified knee: Secondary | ICD-10-CM

## 2018-08-01 DIAGNOSIS — G8929 Other chronic pain: Secondary | ICD-10-CM | POA: Insufficient documentation

## 2018-08-01 DIAGNOSIS — I639 Cerebral infarction, unspecified: Secondary | ICD-10-CM | POA: Insufficient documentation

## 2018-08-01 DIAGNOSIS — G43909 Migraine, unspecified, not intractable, without status migrainosus: Secondary | ICD-10-CM | POA: Insufficient documentation

## 2018-08-11 DIAGNOSIS — M25572 Pain in left ankle and joints of left foot: Secondary | ICD-10-CM | POA: Diagnosis not present

## 2018-08-11 DIAGNOSIS — Z23 Encounter for immunization: Secondary | ICD-10-CM | POA: Diagnosis not present

## 2018-08-13 DIAGNOSIS — M76822 Posterior tibial tendinitis, left leg: Secondary | ICD-10-CM | POA: Diagnosis not present

## 2018-08-14 ENCOUNTER — Ambulatory Visit (INDEPENDENT_AMBULATORY_CARE_PROVIDER_SITE_OTHER): Payer: PPO

## 2018-08-14 ENCOUNTER — Encounter: Payer: Self-pay | Admitting: Orthopedic Surgery

## 2018-08-14 ENCOUNTER — Ambulatory Visit: Payer: PPO | Admitting: Orthopedic Surgery

## 2018-08-14 VITALS — BP 120/71 | HR 76 | Ht 65.0 in | Wt 207.0 lb

## 2018-08-14 DIAGNOSIS — G8929 Other chronic pain: Secondary | ICD-10-CM

## 2018-08-14 DIAGNOSIS — M79672 Pain in left foot: Secondary | ICD-10-CM

## 2018-08-14 DIAGNOSIS — M25572 Pain in left ankle and joints of left foot: Secondary | ICD-10-CM | POA: Diagnosis not present

## 2018-08-14 DIAGNOSIS — M76822 Posterior tibial tendinitis, left leg: Secondary | ICD-10-CM | POA: Diagnosis not present

## 2018-08-14 NOTE — Progress Notes (Signed)
NEW PATIENT OFFICE VISI  Chief Complaint  Patient presents with  . Ankle Pain    left  . Foot Pain    left    72 year old female presents with atraumatic onset of pain in her left foot and ankle status post open treatment internal fixation lateral malleolus 15 years ago.  She reports no trauma.  She complains of medial ankle pain and swelling with swelling standing across the top of the foot.  She did try rest Tylenol ice TENS unit cannot take NSAIDs due to history of ulcer  Pain is located on the posterior medial aspect of the ankle including the posterior tibial tendon which is also swollen she has pain across the dorsum of the foot and also in the lateral gutter.  Quality of pain is dull severity is 8 duration 3 weeks   Review of Systems  Constitutional: Negative for chills and fever.  Musculoskeletal: Positive for joint pain.  Skin: Negative.   Neurological: Negative for tingling.     Past Medical History:  Diagnosis Date  . Depression   . Hearing loss   . Stroke [facility])     Past Surgical History:  Procedure Laterality Date  . ANKLE FRACTURE SURGERY    . APPENDECTOMY    . BREAST LUMPECTOMY    . CESAREAN SECTION    . ROTATOR CUFF REPAIR      Family History  Problem Relation Age of Onset  . Cancer Father   . Heart disease Brother    Social History   Tobacco Use  . Smoking status: Never Smoker  . Smokeless tobacco: Never Used  Substance Use Topics  . Alcohol use: No  . Drug use: No    Allergies  Allergen Reactions  . Ticlid [Ticlopidine] Shortness Of Breath  . Anti-Inflammatory Enzyme [Nutritional Supplements]   . Monistat [Miconazole] Swelling and Rash  . Sulfa Antibiotics Rash    Current Meds  Medication Sig  . acetaminophen (TYLENOL) 500 MG tablet Take 1,000 mg by mouth daily as needed for moderate pain.  . ARIPiprazole (ABILIFY) 5 MG tablet Take 5 mg by mouth daily.  . Armodafinil 200 MG TABS Take 100-200 mg by mouth.   .  Dextromethorphan-Quinidine (NUEDEXTA) 20-10 MG CAPS Take by mouth daily.  . DULoxetine (CYMBALTA) 60 MG capsule Take 120 mg by mouth at bedtime.  Marland Kitchen HYDROcodone-acetaminophen (NORCO/VICODIN) 5-325 MG tablet Take 1 tablet by mouth every 6 (six) hours as needed for moderate pain.  . hydrOXYzine (VISTARIL) 25 MG capsule Take 25 mg by mouth at bedtime as needed for itching.  . memantine (NAMENDA) 10 MG tablet   . nefazodone (SERZONE) 200 MG tablet Take 400 mg by mouth at bedtime.  Marland Kitchen oxybutynin (DITROPAN) 5 MG tablet   . pseudoephedrine-guaifenesin (MUCINEX D) 60-600 MG 12 hr tablet Take 1 tablet by mouth at bedtime as needed for congestion.  . SUMAtriptan (IMITREX) 100 MG tablet Take 100 mg by mouth every 2 (two) hours as needed for migraine or headache. May repeat in 2 hours if headache persists or recurs.  Marland Kitchen tiZANidine (ZANAFLEX) 4 MG capsule Take 1 capsule (4 mg total) by mouth 3 (three) times daily.    BP 120/71   Pulse 76   Ht 5\' 5"  (1.651 m)   Wt 207 lb (93.9 kg)   BMI 34.45 kg/m   Physical Exam  Constitutional: She is oriented to person, place, and time. She appears well-developed and well-nourished.  Neurological: She is alert and oriented to person, place, and  time.  Skin: Skin is warm. Capillary refill takes less than 2 seconds. No rash noted. No erythema.  Psychiatric: She has a normal mood and affect. Judgment normal.  Vitals reviewed.   Right Ankle Exam  Right ankle exam is normal.  Tenderness  The patient is experiencing no tenderness. Swelling: none  Range of Motion  The patient has normal right ankle ROM.  Muscle Strength  The patient has normal right ankle strength.  Tests  Anterior drawer: negative Varus tilt: negative  Other  Erythema: absent Scars: absent Sensation: normal Pulse: present    Left Ankle Exam   Tenderness  Left ankle tenderness location: Posterior tibial tendon dorsum of foot and lateral gutter.  Swelling: none  Range of Motion   Dorsiflexion: abnormal  Plantar flexion: abnormal  Eversion: normal  Inversion: normal   Muscle Strength  The patient has normal left ankle strength. Posterior tibial:  3/5  Tests  Anterior drawer: negative Varus tilt: negative  Other  Erythema: absent Scars: absent Sensation: normal Pulse: present        MEDICAL DECISION SECTION  Xrays were done at [facility]  My independent reading of xrays:  Dictated report the foot looks normal other than a 33 degree bunion deformity and the ankle looks normal other than the ankle plate on the lateral malleolus  Encounter Diagnoses  Name Primary?  . Chronic pain of left ankle   . Chronic foot pain, left   . Posterior tibial tendon dysfunction (PTTD) of left lower extremity Yes    PLAN: (Rx., injectx, surgery, frx, mri/ct) Patient cannot take NSAIDs due to ulcer Short cam walker Walker Continue ice Follow-up 7 weeks   No orders of the defined types were placed in this encounter.   Arther Abbott, MD  08/14/2018 9:04 AM

## 2018-08-14 NOTE — Patient Instructions (Signed)
Posterior Tibialis Tendinosis Posterior tibialis tendinosis is irritation and degeneration of a tendon called the posterior tibial tendon. Your posterior tibial tendon is a cord-like tissue that connects bones of your lower leg and foot to a muscle that:  Supports your arch.  Helps you raise up on your toes.  Helps you turn your foot down and in.  This condition causes foot and ankle pain and can lead to a flat foot. What are the causes? This condition is most often caused by repeated stress to the tendon (overuse injury). It can also be caused by a sudden injury that stresses the tendon, such as landing on your foot after jumping or falling. What increases the risk? This condition is more likely to develop in:  People who play a sport that involves putting a lot of pressure on the feet, such as: ? Basketball. ? Tennis. ? Soccer. ? Hockey.  Runners.  Females who are older than 40 years and are overweight.  People with diabetes.  People with decreased foot stability (ligamentous laxity).  People with flat feet.  What are the signs or symptoms? Symptoms of this condition may start suddenly or gradually. Symptoms include:  Pain in the inner ankle.  Pain at the arch of your foot.  Pain that gets worse with running, walking, or standing.  Swelling on the inside of your ankle and foot.  Weakness in your ankle or foot.  Inability to stand up on tiptoe.  How is this diagnosed? This condition may be diagnosed based on:  Your symptoms.  Your medical history.  A physical exam.  Tests, such as: ? An X-ray. ? MRI. ? An ultrasound.  During the physical exam, your health care provider may move your foot and ankle, test your strength and balance, and check the arch of your foot while you stand or walk. How is this treated? This condition may be treated by:  Replacing high-impact exercise with low-impact exercise, such as swimming or cycling.  Applying ice to the  injured area.  Taking an anti-inflammatory pain medicine.  Physical therapy.  Wearing a special shoe or shoe insert to support your arch (orthotic).  If your symptoms do not improve with these treatments, you may need to wear a splint, removable walking boot, or short leg cast for 6-8 weeks to keep your foot and ankle still. Follow these instructions at home: If you have a boot or splint:  Wear the boot or splint as told by your health care provider. Remove it only as told by your health care provider.  Do not use your foot to support (bear) your full body weight until your health care provider says that you can.  Loosen the boot or splint if your toes tingle, become numb, or turn cold and blue.  Keep the boot or splint clean.  If your boot or splint is not waterproof: ? Do not let it get wet. ? Cover it with a watertight plastic bag when you take a bath or shower. If you have a cast:  Do not stick anything inside the cast to scratch your skin. Doing that increases your risk of infection.  Check the skin around the cast every day. Tell your health care provider about any concerns.  You may put lotion on dry skin around the edges of the cast. Do not apply lotion to the skin underneath the cast.  Keep the cast clean.  Do not take baths, swim, or use a hot tub until your health care  provider approves. Ask your health care provider if you can take showers. You may only be allowed to take sponge baths for bathing.  If your cast is not waterproof: ? Do not let it get wet. ? Cover it with a watertight plastic bag while you take a bath or a shower. Managing pain and swelling  Take over-the-counter and prescription medicines only as told by your health care provider.  If directed, apply ice to the injured area: ? Put ice in a plastic bag. ? Place a towel between your skin and the bag. ? Leave the ice on for 20 minutes, 2-3 times a day.  Raise (elevate) your ankle above the level  of your heart when resting if you have swelling. Activity  Do not do activities that make pain or swelling worse.  Return to full activity gradually as symptoms improve.  Do exercises as told by your health care provider. General instructions  If you have an orthotic, use it as told by your health care provider.  Keep all follow-up visits as told by your health care provider. This is important. How is this prevented?  Wear footwear that is appropriate to your athletic activity.  Avoid athletic activities that cause pain or swelling in your ankle or foot.  Before being active, do range-of-motion and stretching exercises.  If you develop pain or swelling while training, stop training.  If you have pain or swelling that does not improve after a few days of rest, see your health care provider.  If you start a new athletic activity, start gradually so you can build up your strength and flexibility. Contact a health care provider if:  Your symptoms get worse.  Your symptoms do not improve in 6-8 weeks.  You develop new, unexplained symptoms.  Your splint, boot, or cast gets damaged. This information is not intended to replace advice given to you by your health care provider. Make sure you discuss any questions you have with your health care provider. Document Released: 10/28/2005 Document Revised: 07/02/2016 Document Reviewed: 07/14/2015 Elsevier Interactive Patient Education  [employer].

## 2018-08-17 ENCOUNTER — Telehealth: Payer: Self-pay | Admitting: Orthopedic Surgery

## 2018-08-17 ENCOUNTER — Encounter: Payer: Self-pay | Admitting: Orthopedic Surgery

## 2018-08-17 DIAGNOSIS — M1712 Unilateral primary osteoarthritis, left knee: Secondary | ICD-10-CM | POA: Diagnosis not present

## 2018-08-17 DIAGNOSIS — M1711 Unilateral primary osteoarthritis, right knee: Secondary | ICD-10-CM | POA: Diagnosis not present

## 2018-08-17 NOTE — Telephone Encounter (Signed)
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 Also asking if she is to get a walker, and if so, which type?  States has only a walker with wheels.  States can come back in to pick up work note and walker prescription.  Ph# [phone] may leave a message at this number.

## 2018-08-17 NOTE — Telephone Encounter (Signed)
Patient picked up prescription and work note as requested.

## 2018-08-17 NOTE — Telephone Encounter (Signed)
Walker prescription at front desk  Ok to provide work note as requested

## 2018-09-15 DIAGNOSIS — Z1231 Encounter for screening mammogram for malignant neoplasm of breast: Secondary | ICD-10-CM | POA: Diagnosis not present

## 2018-09-24 IMAGING — DX DG CHEST 2V
2 series · 2 of 2 positions shown · non-contrast
Comparison: CT 12/20/2015.  Chest x-ray 02/06/2012.

CLINICAL DATA: Productive cough.

EXAM:
CHEST  2 VIEW

[chest pa]
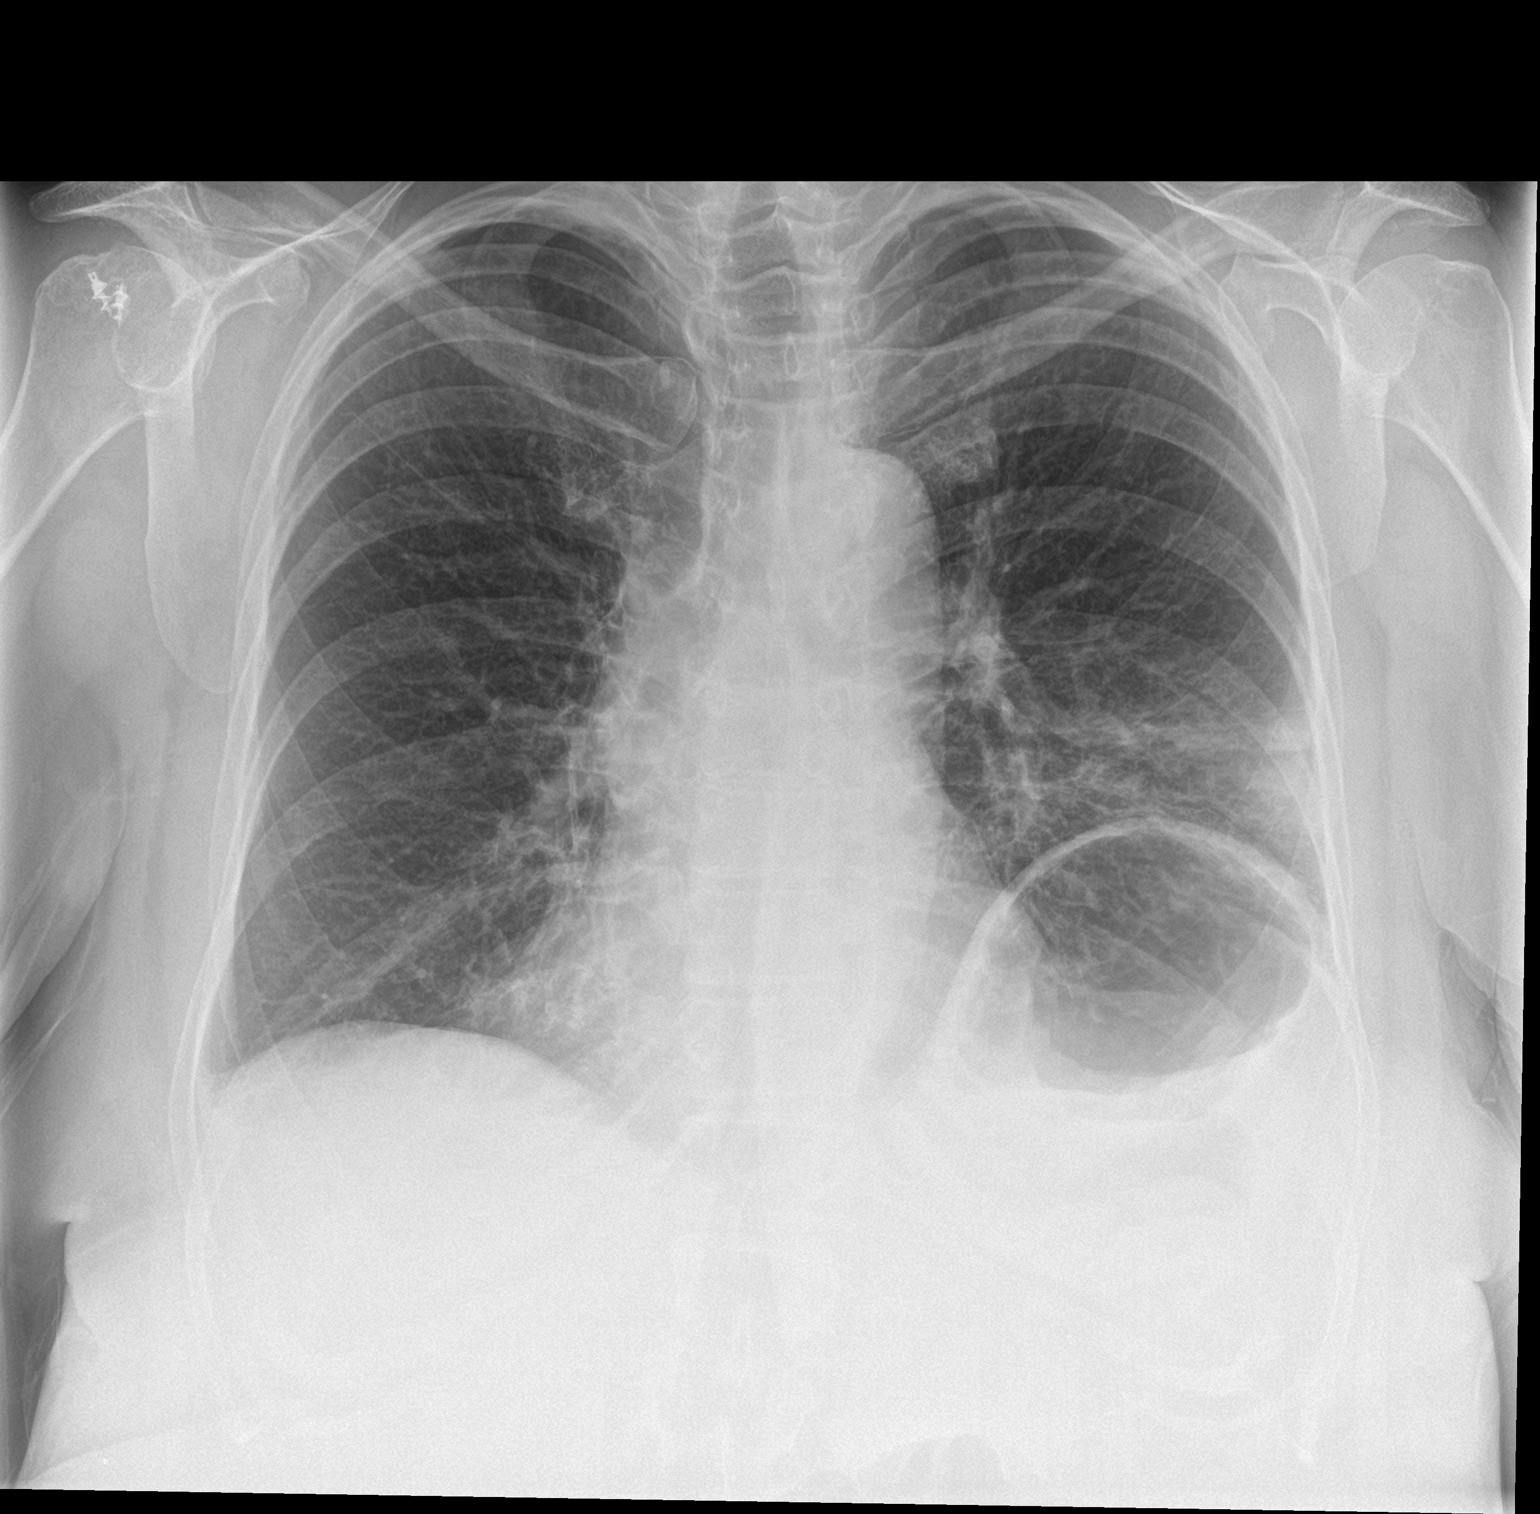

[chest lat]
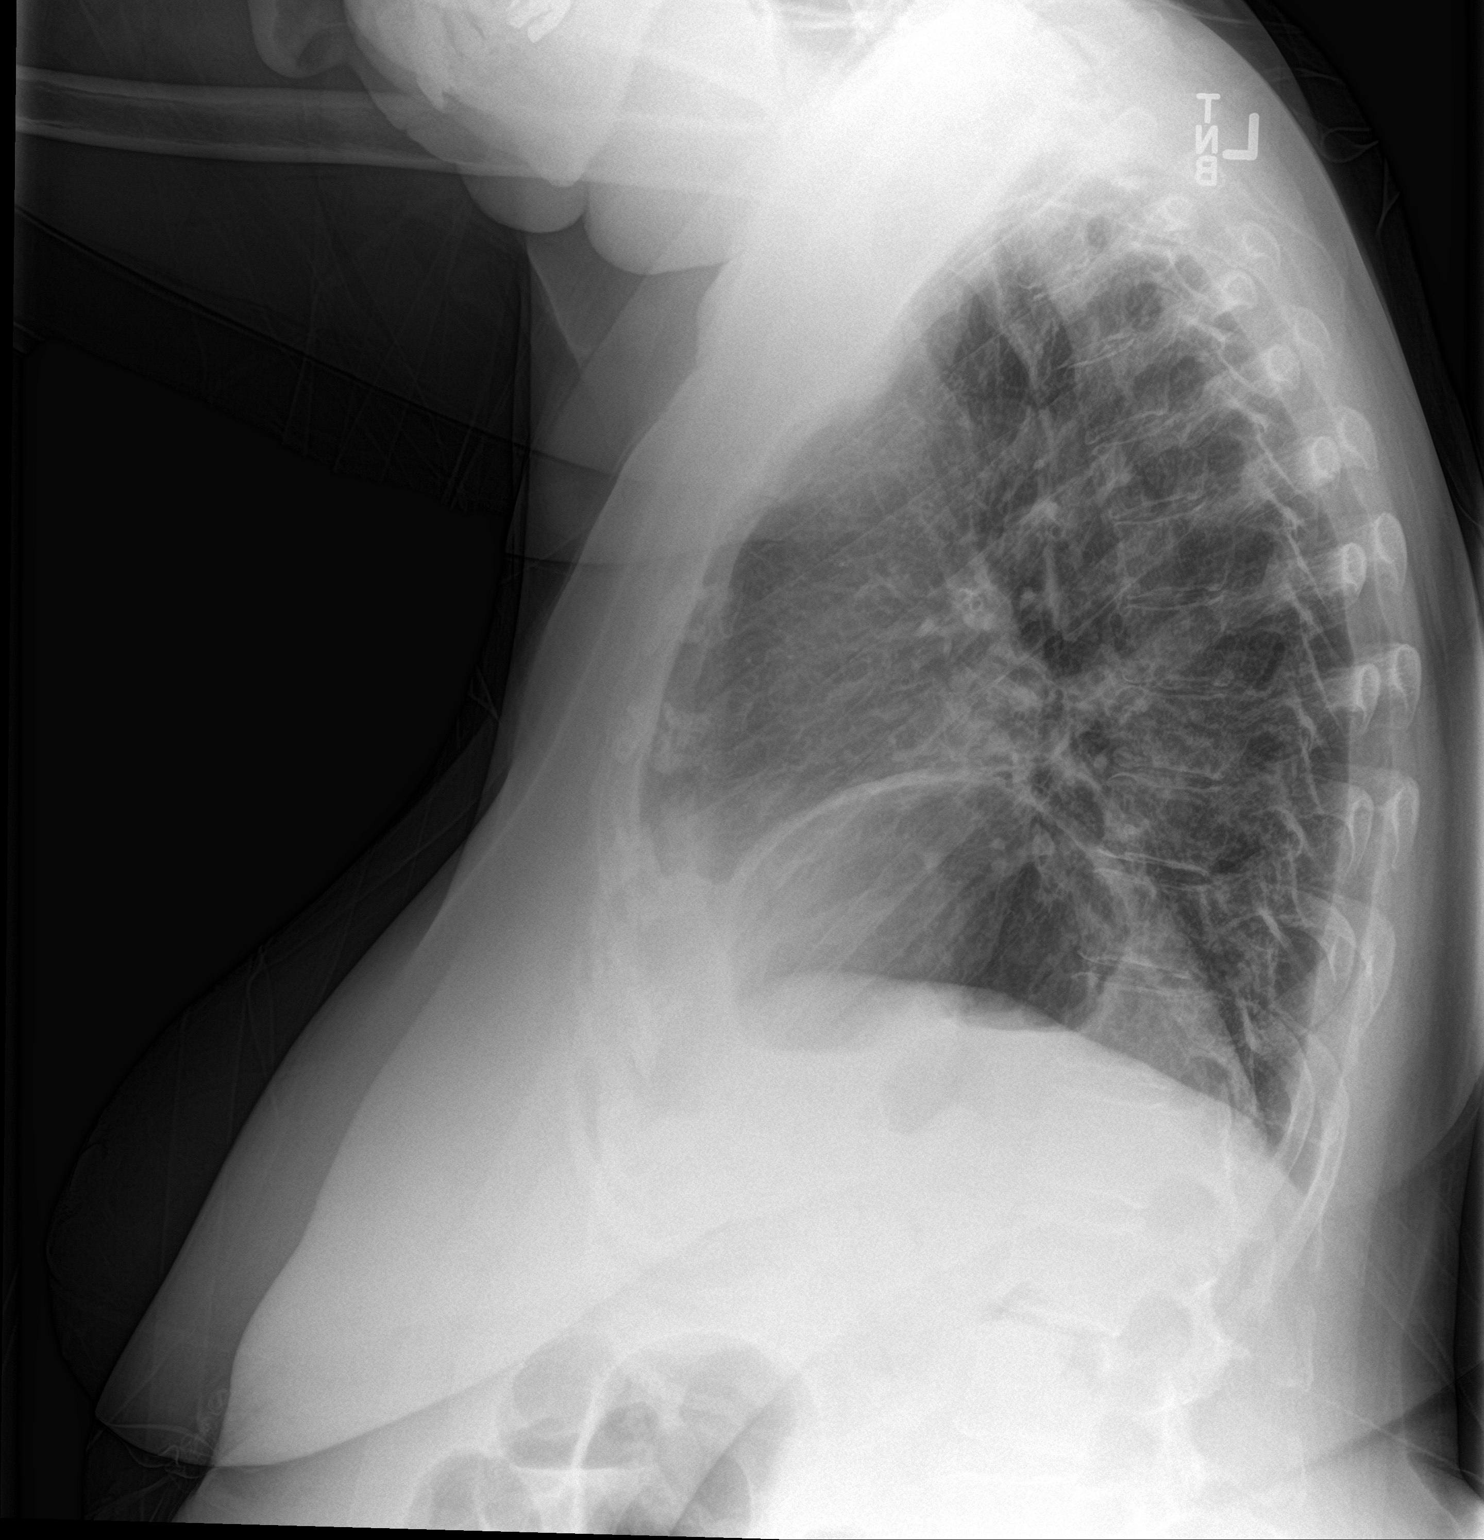

[2 of 2 positions shown; findings below may reference images not displayed]

FINDINGS: Mediastinum hilar structures normal. Left base infiltrate consistent
pneumonia. No pleural effusion or pneumothorax. Heart size normal.
Stable elevation left hemidiaphragm. Postsurgical changes right
shoulder.
IMPRESSION: 1. Left base infiltrate consistent pneumonia.

2.  Stable elevation left hemidiaphragm.

## 2018-10-01 ENCOUNTER — Ambulatory Visit: Payer: Self-pay | Admitting: Psychiatry

## 2018-10-02 ENCOUNTER — Encounter: Payer: Self-pay | Admitting: Psychiatry

## 2018-10-02 ENCOUNTER — Other Ambulatory Visit: Payer: Self-pay

## 2018-10-02 ENCOUNTER — Ambulatory Visit: Payer: PPO | Admitting: Psychiatry

## 2018-10-02 VITALS — BP 131/85 | HR 67

## 2018-10-02 DIAGNOSIS — F482 Pseudobulbar affect: Secondary | ICD-10-CM

## 2018-10-02 DIAGNOSIS — F5101 Primary insomnia: Secondary | ICD-10-CM | POA: Diagnosis not present

## 2018-10-02 DIAGNOSIS — F3342 Major depressive disorder, recurrent, in full remission: Secondary | ICD-10-CM

## 2018-10-02 MED ORDER — NEFAZODONE HCL 200 MG PO TABS: 400.0000 mg | ORAL_TABLET | Freq: Every day | ORAL | 0 refills | Status: DC

## 2018-10-02 MED ORDER — MEMANTINE HCL 10 MG PO TABS: ORAL_TABLET | ORAL | 5 refills | Status: DC

## 2018-10-02 MED ORDER — MODAFINIL 200 MG PO TABS: ORAL_TABLET | ORAL | 4 refills | Status: DC

## 2018-10-02 MED ORDER — ARIPIPRAZOLE 5 MG PO TABS: 5.0000 mg | ORAL_TABLET | Freq: Every day | ORAL | 5 refills | Status: DC

## 2018-10-02 MED ORDER — NEFAZODONE HCL 200 MG PO TABS: ORAL_TABLET | ORAL | 4 refills | Status: DC

## 2018-10-02 MED ORDER — DULOXETINE HCL 60 MG PO CPEP: 120.0000 mg | ORAL_CAPSULE | Freq: Every day | ORAL | 1 refills | Status: DC

## 2018-10-02 NOTE — Progress Notes (Signed)
EMBREE BRAWLEY [id number] 1946-02-02 72 y.o.  Subjective:   Patient ID:  [name] is a 72 y.o. (DOB 11-Apr-1946) female.  Chief Complaint:  Chief Complaint  Patient presents with  . Follow-up    h/o depression, anxiety, concentration issues, insomnia    HPI [name] presents to the office today for follow-up of mood and anxiety. She reports that she lost her job after her mobility has been limited due to torn tendons in her heel. She reports that her mood has been "pretty good." She reports that she and her husband have been getting along well. Denies any recent anxiety. Reports that sleep is interrupted due to frequent urination and that she would likely sleep without difficulty if she did not have frequent urination. Reports PCP recently increased her ditropan. She reports that her appetite has been good. She reports that energy and motivation are adequate with Provigil. She reports that Provigil is also helpful for concentration. She reports that she continues to have difficulty with concentration and will often misplace items and forgets things, such as events from yesterday. Denies SI.   She reports that she is concerned about Nefazadone since insurance will not cover it. She reports that she slept better when she was taking 400 mg po QHS.   Past medication trials: Celexa Pristiq Wellbutrin-hallucinations Effexor Prozac Zoloft Lexapro Nefazodone-effective and well-tolerated Cymbalta Nuedexta Trazodone Vistaril Nuvigil Provigil Memantine  Review of Systems:  Review of Systems  Genitourinary: Positive for frequency.  Musculoskeletal: Negative for gait problem.       Reports that she tore tendons in her heel and has been wearing a boot  Neurological: Positive for tremors.       She reports occasional lip tremor and that she will go long periods without lip quiver.   Psychiatric/Behavioral:       Please refer to HPI    Medications: I have reviewed the  patient's current medications.  Current Outpatient Medications  Medication Sig Dispense Refill  . acetaminophen (TYLENOL) 500 MG tablet Take 1,000 mg by mouth daily as needed for moderate pain.    Marland Kitchen albuterol (PROVENTIL HFA;VENTOLIN HFA) 108 (90 Base) MCG/ACT inhaler Inhale 2 puffs into the lungs every 6 (six) hours as needed for wheezing or shortness of breath.    . ARIPiprazole (ABILIFY) 5 MG tablet Take 1 tablet (5 mg total) by mouth daily. 30 tablet 5  . Cholecalciferol (VITAMIN D-1000 MAX ST) 1000 units tablet Take by mouth.    . DULoxetine (CYMBALTA) 60 MG capsule Take 2 capsules (120 mg total) by mouth at bedtime. 180 capsule 1  . HYDROcodone-acetaminophen (NORCO/VICODIN) 5-325 MG tablet Take 1 tablet by mouth every 6 (six) hours as needed for moderate pain.    . nefazodone (SERZONE) 200 MG tablet Take 2 tablets (400 mg total) by mouth at bedtime. (Patient taking differently: Take 200 mg by mouth at bedtime. ) 180 tablet 0  . oxybutynin (DITROPAN-XL) 10 MG 24 hr tablet Take 10 mg by mouth at bedtime.    . pseudoephedrine-guaifenesin (MUCINEX D) 60-600 MG 12 hr tablet Take 1 tablet by mouth at bedtime as needed for congestion.    . SUMAtriptan (IMITREX) 100 MG tablet Take 100 mg by mouth every 2 (two) hours as needed for migraine or headache. May repeat in 2 hours if headache persists or recurs.    . ALPRAZolam (XANAX) 1 MG tablet Take 1 tablet (1 mg total) by mouth at bedtime as needed for anxiety. (Patient not taking:  Reported on 08/14/2018) 2 tablet 0  . hydrOXYzine (VISTARIL) 25 MG capsule Take 25 mg by mouth at bedtime as needed for itching.    . levocetirizine (XYZAL) 5 MG tablet Take 5 mg by mouth.    . memantine (NAMENDA) 10 MG tablet Take 1/2 tab po q am and 1 tab po QHS x 1 week, then increase to 1 tab po BID 60 tablet 5  . modafinil (PROVIGIL) 200 MG tablet Take 1 tab po q am and 1 po qd 45 tablet 4  . nefazodone (SERZONE) 200 MG tablet Take 1-2 po QHS 60 tablet 4  . tiZANidine  (ZANAFLEX) 4 MG capsule Take 1 capsule (4 mg total) by mouth 3 (three) times daily. (Patient not taking: Reported on 10/02/2018) 15 capsule 0   No current facility-administered medications for this visit.     Medication Side Effects: Other: Possible occasional lip quiver  Allergies:  Allergies  Allergen Reactions  . Ticlid [Ticlopidine] Shortness Of Breath  . Anti-Inflammatory Enzyme [Nutritional Supplements]   . Nsaids   . Monistat [Miconazole] Swelling and Rash  . Sulfa Antibiotics Rash    Past Medical History:  Diagnosis Date  . Arthritis   . Depression   . H/O head injury   . Hearing loss   . History of stomach ulcers   . Migraine   . Stroke [facility])     Family History  Problem Relation Age of Onset  . Cancer Father   . Heart disease Brother   . Alcohol abuse Brother   . Depression Maternal Grandmother   . Depression Grandchild     Social History   Socioeconomic History  . Marital status: Married    Spouse name: Not on file  . Number of children: Not on file  . Years of education: Not on file  . Highest education level: Not on file  Occupational History  . Not on file  Social Needs  . Financial resource strain: Not on file  . Food insecurity:    Worry: Not on file    Inability: Not on file  . Transportation needs:    Medical: Not on file    Non-medical: Not on file  Tobacco Use  . Smoking status: Never Smoker  . Smokeless tobacco: Never Used  Substance and Sexual Activity  . Alcohol use: No  . Drug use: No  . Sexual activity: Not on file  Lifestyle  . Physical activity:    Days per week: Not on file    Minutes per session: Not on file  . Stress: Not on file  Relationships  . Social connections:    Talks on phone: Not on file    Gets together: Not on file    Attends religious service: Not on file    Active member of club or organization: Not on file    Attends meetings of clubs or organizations: Not on file    Relationship status: Not on file   . Intimate partner violence:    Fear of current or ex partner: Not on file    Emotionally abused: Not on file    Physically abused: Not on file    Forced sexual activity: Not on file  Other Topics Concern  . Not on file  Social History Narrative  . Not on file    Past Medical History, Surgical history, Social history, and Family history were reviewed and updated as appropriate.   Please see review of systems for further details on the patient's  review from today.   Objective:   Physical Exam:  BP 131/85   Pulse 67   Physical Exam  Constitutional: She is oriented to person, place, and time. She appears well-developed. No distress.  Musculoskeletal: She exhibits no deformity.  Neurological: She is alert and oriented to person, place, and time. Coordination normal.  Psychiatric: She has a normal mood and affect. Her speech is normal and behavior is normal. Judgment and thought content normal. Her mood appears not anxious. Her affect is not angry, not blunt, not labile and not inappropriate. Cognition and memory are normal. She does not exhibit a depressed mood. She expresses no homicidal and no suicidal ideation. She expresses no suicidal plans and no homicidal plans.  Insight intact. No auditory or visual hallucinations. No delusions.     Lab Review:     Component Value Date/Time   NA 140 05/27/2017 1553   K 5.0 05/27/2017 1553   CL 97 05/27/2017 1553   CO2 27 05/27/2017 1553   GLUCOSE 81 05/27/2017 1553   GLUCOSE 109 (H) 12/20/2015 1915   BUN 15 05/27/2017 1553   CREATININE 0.66 05/27/2017 1553   CALCIUM 10.2 05/27/2017 1553   PROT 7.4 05/27/2017 1553   ALBUMIN 4.5 05/27/2017 1553   AST 26 05/27/2017 1553   ALT 20 05/27/2017 1553   ALKPHOS 69 05/27/2017 1553   BILITOT 0.4 05/27/2017 1553   GFRNONAA 90 05/27/2017 1553   GFRAA 104 05/27/2017 1553       Component Value Date/Time   WBC 14.1 (H) 12/20/2015 1905   RBC 4.24 12/20/2015 1905   HGB 14.6 12/20/2015 1915    HCT 43.0 12/20/2015 1915   PLT 253 12/20/2015 1905   MCV 96.2 12/20/2015 1905   MCH 30.7 12/20/2015 1905   MCHC 31.9 12/20/2015 1905   RDW 13.4 12/20/2015 1905    No results found for: POCLITH, LITHIUM   No results found for: PHENYTOIN, PHENOBARB, VALPROATE, CBMZ   .res Assessment: Plan:   Patient seen for 30 minutes and greater than 50% of visit spent counseling patient regarding treatment plan to include her concerns about cost of nefazodone.  Discussed considering applying for tier exception with insurance since nefazodone has been effective for her insomnia and mood and she has had poor response and tolerability issues to multiple other medications.  Patient confirms that she is taking memantine only once daily and discussed increasing to twice daily.  Discussed taking memantine 5 mg in the morning and 10 mg at bedtime for 1 week and then increasing to 10 mg twice daily to improve cognition.  Discussed that lip quiver can be a possible side effect of Abilify and can become permanent.  Discussed that decreasing dose may potentially lower risk of permanent tardive dyskinesia.  Patient reports that mood signs and symptoms have significantly improved with Abilify and at this time she believes that benefits outweigh potential risk.  Advised patient to contact office if involuntary orofacial movements increase in frequency or severity.  Recurrent major depressive disorder, in full remission ([place]) - Plan: ARIPiprazole (ABILIFY) 5 MG tablet, modafinil (PROVIGIL) 200 MG tablet, DULoxetine (CYMBALTA) 60 MG capsule, nefazodone (SERZONE) 200 MG tablet  Primary insomnia - Plan: nefazodone (SERZONE) 200 MG tablet  PBA (pseudobulbar affect) - Plan: modafinil (PROVIGIL) 200 MG tablet  Please see After Visit Summary for patient specific instructions.  Future Appointments  Date Time Provider [place]  10/05/2018 11:30 AM Carole Civil, MD ROS-ROS ROSM  01/27/2019  1:30 PM Eulas Post,  Janett Billow, PMHNP CP-CP None    No orders of the defined types were placed in this encounter.     -------------------------------

## 2018-10-05 ENCOUNTER — Telehealth: Payer: Self-pay | Admitting: Radiology

## 2018-10-05 ENCOUNTER — Ambulatory Visit: Payer: PPO | Admitting: Orthopedic Surgery

## 2018-10-05 ENCOUNTER — Encounter: Payer: Self-pay | Admitting: Orthopedic Surgery

## 2018-10-05 VITALS — BP 128/73 | HR 91 | Ht 65.0 in | Wt 210.0 lb

## 2018-10-05 DIAGNOSIS — M76822 Posterior tibial tendinitis, left leg: Secondary | ICD-10-CM

## 2018-10-05 NOTE — Telephone Encounter (Signed)
Posterior tibial tendon ? Ankle vs foot MRI spoke to Dr Gwenlyn Found and this is ordered as Ankle. Have sent prior auth to insurance.

## 2018-10-05 NOTE — Progress Notes (Signed)
Chief Complaint  Patient presents with  . Ankle Pain    Left ankle    See dictated report from last visit below  She did get relief in the cam walker but when she is out of the cam walker she has pain she cannot take anti-inflammatories. She already has tried foot orthotics.    72 year old female presents with atraumatic onset of pain in her left foot and ankle status post open treatment internal fixation lateral malleolus 15 years ago.  She reports no trauma.  She complains of medial ankle pain and swelling with swelling standing across the top of the foot.  She did try rest Tylenol ice TENS unit cannot take NSAIDs due to history of ulcer   Pain is located on the posterior medial aspect of the ankle including the posterior tibial tendon which is also swollen she has pain across the dorsum of the foot and also in the lateral gutter.  Quality of pain is dull severity is 8 duration 3 weeks     Review of Systems no change since October 4 Constitutional: Negative for chills and fever.  Musculoskeletal: Positive for joint pain.  Skin: Negative.   Neurological: Negative for tingling.    PLAN: (Rx., injectx, surgery, frx, mri/ct) Patient cannot take NSAIDs due to ulcer Short cam walker Walker Continue ice Follow-up 7 weeks   BP 128/73   Pulse 91   Ht 5\' 5"  (1.651 m)   Wt 210 lb (95.3 kg)   BMI 34.95 kg/m  Awake and alert Oriented x3 mood pleasant Foot deformity as previously described minimal dorsiflexion decreased plantarflexion Subtalar motion remains but seems it may be diminished Bunion deformity great toe Neurovascular exam intact  Imaging MRI of the foot for posterior tibial dysfunction to stage IV possible surgery

## 2018-10-12 ENCOUNTER — Ambulatory Visit (HOSPITAL_COMMUNITY)
Admission: RE | Admit: 2018-10-12 | Discharge: 2018-10-12 | Disposition: A | Discharge status: Home / Self Care | Payer: PPO | Source: Ambulatory Visit | Attending: Orthopedic Surgery | Admitting: Orthopedic Surgery

## 2018-10-12 DIAGNOSIS — X58XXXA Exposure to other specified factors, initial encounter: Secondary | ICD-10-CM | POA: Diagnosis not present

## 2018-10-12 DIAGNOSIS — M76822 Posterior tibial tendinitis, left leg: Secondary | ICD-10-CM | POA: Diagnosis not present

## 2018-10-12 DIAGNOSIS — S86312A Strain of muscle(s) and tendon(s) of peroneal muscle group at lower leg level, left leg, initial encounter: Secondary | ICD-10-CM | POA: Diagnosis not present

## 2018-10-16 ENCOUNTER — Ambulatory Visit: Payer: Self-pay | Admitting: Orthopedic Surgery

## 2018-10-16 ENCOUNTER — Telehealth: Payer: Self-pay | Admitting: Orthopedic Surgery

## 2018-10-16 NOTE — Telephone Encounter (Signed)
Patient called this morning to cancel her appointment due to being sick. She is asking if Dr. Aline Brochure would call her with the results of her MRI. Stated that she would reschedule if she had to, but hopes he will call.  Please advise   IMPRESSION: Peroneal tenosynovitis with a longitudinal split tear of the peroneus brevis extending from posterior to the distal fibula to approximately the peroneal tubercle of the calcaneus.  Subchondral marrow edema about the tibiotalar joint could be due to degenerative disease or stress change. No fracture is identified.   Electronically Signed   By: Inge Rise M.D.   On: 10/12/2018 13:50

## 2018-10-16 NOTE — Telephone Encounter (Signed)
Patient called this morning to cancel her appointment due to being sick. She is asking if Dr. Aline Brochure would call her with the results of her MRI. Stated that she would reschedule if she had to, but hopes he will call.  Please advise

## 2018-10-20 ENCOUNTER — Telehealth: Payer: Self-pay | Admitting: Orthopedic Surgery

## 2018-10-20 NOTE — Telephone Encounter (Signed)
Patient called wanting to find out what Dr. Aline Brochure was wanting to tell her yesterday. Her phone was dead and she was out and about, she didn't get to call him back before 5 pm yesterday.  Please call and advise

## 2018-10-27 ENCOUNTER — Telehealth: Payer: Self-pay | Admitting: Orthopedic Surgery

## 2018-10-27 NOTE — Telephone Encounter (Signed)
Ms. Hurrell requests that Dr. Aline Brochure call her on Wednesday between 2:00 and 5:00 to give her the MRI results.    If he does not want to give the results over the phone, she asks that we call her and give her an appointment.  Thanks

## 2018-10-27 NOTE — Telephone Encounter (Signed)
Dr Aline Brochure is with patients between 2 and 5 on Wednesdays Ok to make appointment for next available.

## 2018-10-27 NOTE — Telephone Encounter (Signed)
I spoke to patient again.  She will come in to see Dr. Aline Brochure tomorrow for her MRI results

## 2018-10-28 ENCOUNTER — Encounter: Payer: Self-pay | Admitting: Orthopedic Surgery

## 2018-10-28 ENCOUNTER — Ambulatory Visit: Payer: PPO | Admitting: Orthopedic Surgery

## 2018-10-28 VITALS — Ht 65.0 in | Wt 209.0 lb

## 2018-10-28 DIAGNOSIS — M76822 Posterior tibial tendinitis, left leg: Secondary | ICD-10-CM | POA: Diagnosis not present

## 2018-10-28 DIAGNOSIS — S86312A Strain of muscle(s) and tendon(s) of peroneal muscle group at lower leg level, left leg, initial encounter: Secondary | ICD-10-CM

## 2018-10-28 DIAGNOSIS — M19072 Primary osteoarthritis, left ankle and foot: Secondary | ICD-10-CM

## 2018-10-28 NOTE — Patient Instructions (Signed)
Dr Sharol Given office will call you with appointment The number is [phone]  He is a foot and ankle specialist, his office is located in [place]

## 2018-10-28 NOTE — Progress Notes (Signed)
Chief Complaint  Patient presents with  . Results    MRI results   History this is a 72 year old female she had atraumatic onset of pain left foot and ankle.  She had an open treatment internal fixation lateral malleolus 15 years ago the pain however is on the medial side of the ankle with swelling she did not improve after orthotics.  She is intolerant to NSAIDs because of an ulcer history she tried ice and a TENS unit which did not help.  I sent her for MRI the MRI findings are below  I thought she had medial posterior tibial tendon dysfunction because the pain was there however the MRI says she has a peroneal tendon tear laterally where she has mild discomfort but definitely not the majority of her pain  She has seen some mild improvement with the cam walker but not enough to get it off of her foot   Review of systems no new findings no numbness or tingling of the foot no evidence of tarsal tunnel syndrome  I interpret the MRI as peroneal tendon rupture with subchondral bone edema talus and distal tibia  MRI report was read  CLINICAL DATA:  Left ankle pain since an injury stepping off a leg edge 2 months ago. History of fixation of a distal fibular fracture 15 years ago.  EXAM: MRI OF THE LEFT ANKLE WITHOUT CONTRAST  TECHNIQUE: Multiplanar, multisequence MR imaging of the ankle was performed. No intravenous contrast was administered.  COMPARISON:  Plain films left foot and ankle 08/14/2018.  FINDINGS: Plate and screw fixation of a distal fibular fracture results in artifact on the examination.  TENDONS  Peroneal: There is fluid in the sheath of the peroneal tendons consistent with tenosynovitis. Longitudinal split tear of the peroneus brevis extends from posterior to the lateral malleolus to near the peroneal tubercle of the calcaneus.  Posteromedial: Intact.  Anterior: Intact.  Achilles: Intact.  Plantar Fascia: Appears normal.  LIGAMENTS  Lateral:  Cannot be assessed due to artifact from hardware.  Medial: Intact.  CARTILAGE  Ankle Joint: Appears normal.  Subtalar Joints/Sinus Tarsi: Appear normal.  Bones: Subchondral marrow edema is seen about the tibiotalar joint. A few tiny subchondral cysts are seen in the posterior tibial plafond on the lateral side.  Other: None.  IMPRESSION: Peroneal tenosynovitis with a longitudinal split tear of the peroneus brevis extending from posterior to the distal fibula to approximately the peroneal tubercle of the calcaneus.  Subchondral marrow edema about the tibiotalar joint could be due to degenerative disease or stress change. No fracture is identified.   Electronically Signed   By: Inge Rise M.D.   On: 10/12/2018 13:50   I cannot figure this 1 out so I am going to send her to a foot and ankle specialist for evaluation and treatment  Encounter Diagnoses  Name Primary?  . Primary osteoarthritis of left ankle Yes  . Posterior tibial tendon dysfunction (PTTD) of left lower extremity   . Peroneal tendon rupture, left, initial encounter

## 2018-11-10 ENCOUNTER — Encounter (INDEPENDENT_AMBULATORY_CARE_PROVIDER_SITE_OTHER): Payer: Self-pay | Admitting: Orthopedic Surgery

## 2018-11-10 ENCOUNTER — Ambulatory Visit (INDEPENDENT_AMBULATORY_CARE_PROVIDER_SITE_OTHER): Payer: PPO | Admitting: Orthopedic Surgery

## 2018-11-10 VITALS — Ht 65.0 in | Wt 209.0 lb

## 2018-11-10 DIAGNOSIS — M6702 Short Achilles tendon (acquired), left ankle: Secondary | ICD-10-CM | POA: Diagnosis not present

## 2018-11-10 DIAGNOSIS — M6701 Short Achilles tendon (acquired), right ankle: Secondary | ICD-10-CM | POA: Diagnosis not present

## 2018-11-10 DIAGNOSIS — M7742 Metatarsalgia, left foot: Secondary | ICD-10-CM

## 2018-11-10 DIAGNOSIS — M722 Plantar fascial fibromatosis: Secondary | ICD-10-CM

## 2018-11-11 ENCOUNTER — Encounter (INDEPENDENT_AMBULATORY_CARE_PROVIDER_SITE_OTHER): Payer: Self-pay | Admitting: Orthopedic Surgery

## 2018-11-11 DIAGNOSIS — M6702 Short Achilles tendon (acquired), left ankle: Secondary | ICD-10-CM | POA: Diagnosis not present

## 2018-11-11 DIAGNOSIS — M722 Plantar fascial fibromatosis: Secondary | ICD-10-CM

## 2018-11-11 DIAGNOSIS — M6701 Short Achilles tendon (acquired), right ankle: Secondary | ICD-10-CM | POA: Diagnosis not present

## 2018-11-11 DIAGNOSIS — M7742 Metatarsalgia, left foot: Secondary | ICD-10-CM | POA: Diagnosis not present

## 2018-11-11 MED ORDER — LIDOCAINE HCL 1 % IJ SOLN
2.0000 mL | INTRAMUSCULAR | Status: AC | PRN
  Administered 2018-11-11: 2 mL

## 2018-11-11 MED ORDER — METHYLPREDNISOLONE ACETATE 40 MG/ML IJ SUSP
40.0000 mg | INTRAMUSCULAR | Status: AC | PRN
  Administered 2018-11-11: 40 mg

## 2018-11-11 NOTE — Progress Notes (Signed)
Office Visit Note   Patient: [name]           Date of Birth: 1946/06/11           MRN: [id number] Visit Date: 11/10/2018              Requested by: Carole Civil, MD [address] [place],  [address] PCP: Asencion Noble, MD  Chief Complaint  Patient presents with  . Left Ankle - Pain      HPI: Patient is a 73 year old woman who is seen for initial evaluation for left foot and ankle pain.  Patient states she has the most pain with start up in the morning and with start up after prolonged sitting.  Patient is status post open reduction internal fixation of a fibular fracture.  She complains of pain starting from the origin the plantar fascia radiating proximally along the Achilles and gastrocnemius muscles.  She is currently in a fracture boot.  She is status post an MRI scan.  Assessment & Plan: Visit Diagnoses:  1. Achilles tendon contracture, bilateral   2. Plantar fasciitis, left   3. Metatarsalgia of left foot     Plan: Patient was given instructions and demonstrated Achilles stretching to do 5 times a day a minute at a time the plantar fascia was injected.  Plan to follow-up in 4 weeks for reevaluation.  Patient may require a gastrocnemius recession.  Recommended a stiff soled sneaker such as a Aeronautical engineer.  Follow-Up Instructions: Return in about 4 weeks (around 12/08/2018).   Ortho Exam  Patient is alert, oriented, no adenopathy, well-dressed, normal affect, normal respiratory effort. Examination patient has a good dorsalis pedis pulse she has significant Achilles contracture with dorsiflexion about 30 degrees short of neutral with her knee extended.  She has no tenderness to palpation over the posterior tibial tendon or the peroneal tendons the tarsal tunnel is nontender to palpation she is point tender to palpation over the origin of the plantar fascia.  Review of the MRI scan does show a longitudinal split tear of the peroneus brevis which is  asymptomatic for her.  No other bony or soft tissue abnormalities no signs of a stress fracture of the calcaneus.  Radiographs are reviewed which shows the fibula is out to length with good alignment and the joint is congruent.  Imaging: No results found. No images are attached to the encounter.  Labs: No results found for: HGBA1C, ESRSEDRATE, CRP, LABURIC, REPTSTATUS, GRAMSTAIN, CULT, LABORGA   Lab Results  Component Value Date   ALBUMIN 4.5 05/27/2017    Body mass index is 34.78 kg/m.  Orders:  No orders of the defined types were placed in this encounter.  No orders of the defined types were placed in this encounter.    Procedures: Foot Inj Date/Time: 11/11/2018 10:08 AM Performed by: Newt Minion, MD Authorized by: Newt Minion, MD   Consent Given by:  Patient Site marked: the procedure site was marked   Timeout: prior to procedure the correct patient, procedure, and site was verified   Indications:  Fasciitis and pain Condition: Plantar Fasciitis   Location: left plantar fascia muscle   Prep: patient was prepped and draped in usual sterile fashion   Needle Size:  22 G Approach:  Lateral Medications:  2 mL lidocaine 1 %; 40 mg methylPREDNISolone acetate 40 MG/ML Patient Tolerance:  Patient tolerated the procedure well with no immediate complications    Clinical Data: No additional findings.  ROS:  All other systems negative, except as noted in the HPI. Review of Systems  Objective: Vital Signs: Ht 5\' 5"  (1.651 m)   Wt 209 lb (94.8 kg)   BMI 34.78 kg/m   Specialty Comments:  No specialty comments available.  PMFS History: Patient Active Problem List   Diagnosis Date Noted  . Arthritis 08/01/2018  . Knee pain, chronic 08/01/2018  . CVA (cerebral vascular accident) ([place]) 08/01/2018  . Hyperlipidemia 08/01/2018  . Migraines 08/01/2018  . Neck pain, chronic 01/13/2014  . Baker's cyst of knee 12/09/2013  . Stiffness of joint, not elsewhere  classified, other specified site 03/02/2013  . Weakness of neck 03/02/2013   Past Medical History:  Diagnosis Date  . Arthritis   . Depression   . H/O head injury   . Hearing loss   . History of stomach ulcers   . Migraine   . Stroke [facility])     Family History  Problem Relation Age of Onset  . Cancer Father   . Heart disease Brother   . Alcohol abuse Brother   . Depression Maternal Grandmother   . Depression Grandchild     Past Surgical History:  Procedure Laterality Date  . ANKLE FRACTURE SURGERY    . APPENDECTOMY    . BREAST LUMPECTOMY    . CESAREAN SECTION    . PARTIAL HYSTERECTOMY    . ROTATOR CUFF REPAIR     Social History   Occupational History  . Not on file  Tobacco Use  . Smoking status: Never Smoker  . Smokeless tobacco: Never Used  Substance and Sexual Activity  . Alcohol use: No  . Drug use: No  . Sexual activity: Not on file

## 2018-12-08 ENCOUNTER — Encounter (INDEPENDENT_AMBULATORY_CARE_PROVIDER_SITE_OTHER): Payer: Self-pay | Admitting: Orthopedic Surgery

## 2018-12-08 ENCOUNTER — Ambulatory Visit (INDEPENDENT_AMBULATORY_CARE_PROVIDER_SITE_OTHER): Payer: PPO | Admitting: Orthopedic Surgery

## 2018-12-08 VITALS — Ht 65.0 in | Wt 209.0 lb

## 2018-12-08 DIAGNOSIS — M6701 Short Achilles tendon (acquired), right ankle: Secondary | ICD-10-CM | POA: Diagnosis not present

## 2018-12-08 DIAGNOSIS — M6702 Short Achilles tendon (acquired), left ankle: Secondary | ICD-10-CM | POA: Diagnosis not present

## 2018-12-08 DIAGNOSIS — M722 Plantar fascial fibromatosis: Secondary | ICD-10-CM

## 2018-12-08 DIAGNOSIS — M25872 Other specified joint disorders, left ankle and foot: Secondary | ICD-10-CM

## 2018-12-13 ENCOUNTER — Encounter (INDEPENDENT_AMBULATORY_CARE_PROVIDER_SITE_OTHER): Payer: Self-pay | Admitting: Orthopedic Surgery

## 2018-12-13 DIAGNOSIS — M6701 Short Achilles tendon (acquired), right ankle: Secondary | ICD-10-CM | POA: Diagnosis not present

## 2018-12-13 DIAGNOSIS — M6702 Short Achilles tendon (acquired), left ankle: Secondary | ICD-10-CM | POA: Diagnosis not present

## 2018-12-13 DIAGNOSIS — M722 Plantar fascial fibromatosis: Secondary | ICD-10-CM | POA: Diagnosis not present

## 2018-12-13 MED ORDER — METHYLPREDNISOLONE ACETATE 40 MG/ML IJ SUSP
40.0000 mg | INTRAMUSCULAR | Status: AC | PRN
  Administered 2018-12-13: 40 mg via INTRA_ARTICULAR

## 2018-12-13 MED ORDER — LIDOCAINE HCL 1 % IJ SOLN
2.0000 mL | INTRAMUSCULAR | Status: AC | PRN
  Administered 2018-12-13: 2 mL

## 2018-12-13 NOTE — Progress Notes (Signed)
Office Visit Note   Patient: [name]           Date of Birth: December 27, 1945           MRN: [id number] Visit Date: 12/08/2018              Requested by: Asencion Noble, MD [address] [place], [place] [address] PCP: Asencion Noble, MD  Chief Complaint  Patient presents with  . Left Foot - Follow-up      HPI: Patient is a 73 year old woman who presents in follow-up for history of plantar fasciitis bilaterally as well as Achilles  tendon contracture.  Patient states she does not have plantar fascia pain at this time she states her pain is primarily anteriorly over the ankle.  Denies any pain from her bunion and claw toes.  Assessment & Plan: Visit Diagnoses:  1. Achilles tendon contracture, bilateral   2. Ankle impingement syndrome, left     Plan: The ankle was injected without complications continue with Achilles stretching.  Follow-Up Instructions: Return in about 4 weeks (around 01/05/2019).   Ortho Exam  Patient is alert, oriented, no adenopathy, well-dressed, normal affect, normal respiratory effort. Examination patient has a good dorsalis pedis pulse she has no pain with subtalar motion.  Her dorsiflexion is approximately  10 degrees short of neutral with her knee extended.  Patient has pain to palpation anteriorly over the ankle.  Plantar fascia is nontender to palpation the Achilles tendon is nontender to palpation.  Patient has no symptoms from her bunion and claw toes.  Imaging: No results found. No images are attached to the encounter.  Labs: No results found for: HGBA1C, ESRSEDRATE, CRP, LABURIC, REPTSTATUS, GRAMSTAIN, CULT, LABORGA   Lab Results  Component Value Date   ALBUMIN 4.5 05/27/2017    Body mass index is 34.78 kg/m.  Orders:  No orders of the defined types were placed in this encounter.  No orders of the defined types were placed in this encounter.    Procedures: Medium Joint Inj: L ankle on 12/13/2018 10:53 AM Indications: pain and  diagnostic evaluation Details: 22 G 1.5 in needle, anteromedial approach Medications: 2 mL lidocaine 1 %; 40 mg methylPREDNISolone acetate 40 MG/ML Outcome: tolerated well, no immediate complications Procedure, treatment alternatives, risks and benefits explained, specific risks discussed. Consent was given by the patient. Immediately prior to procedure a time out was called to verify the correct patient, procedure, equipment, support staff and site/side marked as required. Patient was prepped and draped in the usual sterile fashion.      Clinical Data: No additional findings.  ROS:  All other systems negative, except as noted in the HPI. Review of Systems  Objective: Vital Signs: Ht 5\' 5"  (1.651 m)   Wt 209 lb (94.8 kg)   BMI 34.78 kg/m   Specialty Comments:  No specialty comments available.  PMFS History: Patient Active Problem List   Diagnosis Date Noted  . Arthritis 08/01/2018  . Knee pain, chronic 08/01/2018  . CVA (cerebral vascular accident) ([place]) 08/01/2018  . Hyperlipidemia 08/01/2018  . Migraines 08/01/2018  . Neck pain, chronic 01/13/2014  . Baker's cyst of knee 12/09/2013  . Stiffness of joint, not elsewhere classified, other specified site 03/02/2013  . Weakness of neck 03/02/2013   Past Medical History:  Diagnosis Date  . Arthritis   . Depression   . H/O head injury   . Hearing loss   . History of stomach ulcers   . Migraine   .  Stroke [facility])     Family History  Problem Relation Age of Onset  . Cancer Father   . Heart disease Brother   . Alcohol abuse Brother   . Depression Maternal Grandmother   . Depression Grandchild     Past Surgical History:  Procedure Laterality Date  . ANKLE FRACTURE SURGERY    . APPENDECTOMY    . BREAST LUMPECTOMY    . CESAREAN SECTION    . PARTIAL HYSTERECTOMY    . ROTATOR CUFF REPAIR     Social History   Occupational History  . Not on file  Tobacco Use  . Smoking status: Never Smoker  . Smokeless tobacco:  Never Used  Substance and Sexual Activity  . Alcohol use: No  . Drug use: No  . Sexual activity: Not on file

## 2018-12-24 ENCOUNTER — Telehealth: Payer: Self-pay | Admitting: Psychiatry

## 2018-12-24 NOTE — Telephone Encounter (Signed)
[name] called to ask which of her medications could cause repetitive action/motions.  She can't remember what you told her.  Please call.  Next appt 01/27/19

## 2018-12-25 NOTE — Telephone Encounter (Signed)
Pt states she will try 1/2 tab of Abilify.

## 2018-12-29 DIAGNOSIS — J019 Acute sinusitis, unspecified: Secondary | ICD-10-CM | POA: Diagnosis not present

## 2019-01-05 ENCOUNTER — Ambulatory Visit (INDEPENDENT_AMBULATORY_CARE_PROVIDER_SITE_OTHER): Payer: Self-pay | Admitting: Orthopedic Surgery

## 2019-01-08 DIAGNOSIS — R5383 Other fatigue: Secondary | ICD-10-CM | POA: Diagnosis not present

## 2019-01-27 ENCOUNTER — Ambulatory Visit: Payer: PPO | Admitting: Psychiatry

## 2019-02-10 ENCOUNTER — Other Ambulatory Visit: Payer: Self-pay

## 2019-02-10 DIAGNOSIS — F3342 Major depressive disorder, recurrent, in full remission: Secondary | ICD-10-CM

## 2019-02-10 MED ORDER — DULOXETINE HCL 60 MG PO CPEP
120.0000 mg | ORAL_CAPSULE | Freq: Every day | ORAL | 1 refills | Status: DC
Start: 2019-02-10 — End: 2019-04-16

## 2019-03-25 DIAGNOSIS — J019 Acute sinusitis, unspecified: Secondary | ICD-10-CM | POA: Diagnosis not present

## 2019-04-07 DIAGNOSIS — R6884 Jaw pain: Secondary | ICD-10-CM | POA: Diagnosis not present

## 2019-04-16 ENCOUNTER — Ambulatory Visit: Payer: PPO | Admitting: Psychiatry

## 2019-04-16 ENCOUNTER — Encounter: Payer: Self-pay | Admitting: Psychiatry

## 2019-04-16 ENCOUNTER — Other Ambulatory Visit: Payer: Self-pay

## 2019-04-16 DIAGNOSIS — F331 Major depressive disorder, recurrent, moderate: Secondary | ICD-10-CM

## 2019-04-16 DIAGNOSIS — F5101 Primary insomnia: Secondary | ICD-10-CM | POA: Diagnosis not present

## 2019-04-16 DIAGNOSIS — F482 Pseudobulbar affect: Secondary | ICD-10-CM | POA: Diagnosis not present

## 2019-04-16 MED ORDER — ARIPIPRAZOLE 5 MG PO TABS: 5.0000 mg | ORAL_TABLET | Freq: Every day | ORAL | 1 refills | Status: DC

## 2019-04-16 MED ORDER — MODAFINIL 200 MG PO TABS: ORAL_TABLET | ORAL | 4 refills | Status: DC

## 2019-04-16 MED ORDER — DULOXETINE HCL 60 MG PO CPEP: 120.0000 mg | ORAL_CAPSULE | Freq: Every day | ORAL | 1 refills | Status: DC

## 2019-04-16 NOTE — Progress Notes (Signed)
[name] [id number] 03-31-1946 73 y.o.  Subjective:   Patient ID:  [name] is a 72 y.o. (DOB 1945-12-05) female.  Chief Complaint:  Chief Complaint  Patient presents with  . Depression    HPI [name] presents to the office today for follow-up of depression and insomnia. Reports that she stopped Namenda and Abilify about 2 months ago. "I was in a really good mood for awhile and all of a sudden things were bad." She c/o feeling "bored." Reports that she prefers to stay busy. Has read multiple books recently. She reports that her mood was ok at the start of the pandemic. Noticed mood changes in early May. She reports that her mood has been irritable. Reports that her mood may be somewhat depressed in response to situations affecting family. She has been worrying excessively. Denies feeling nervous. "I'm not calm."  Reports that she went through "a sleep cycle where all I wanted to do was sleep." Sleep is "ok." Reports going to bed around 11 pm and awakening at 8 or 9 am. She reports that her appetite has been good. Reports that her energy and motivation have been low. Reports that her concentration is fair. Denies SI.   She reports that she has not noticed any significant improvement with Namenda.   She reports licking her lips repeatedly and describes this as involuntary. She reports that there was a period where this resolved last year. Will alsShe reports that she recognizes it more when she is anxious. Reports that it stopped in the fall and then got worse in the early part of the year.    Review of Systems:  Review of Systems  Musculoskeletal:       Foot pain  Neurological: Negative for tremors.    Medications: I have reviewed the patient's current medications.  Current Outpatient Medications  Medication Sig Dispense Refill  . Cholecalciferol (VITAMIN D-1000 MAX ST) 1000 units tablet Take by mouth.    . DULoxetine (CYMBALTA) 60 MG capsule Take 2 capsules (120 mg  total) by mouth at bedtime. 180 capsule 1  . modafinil (PROVIGIL) 200 MG tablet Take 1 tab po q am and 1/2 tab po qd 45 tablet 4  . nefazodone (SERZONE) 200 MG tablet Take 2 tablets (400 mg total) by mouth at bedtime. (Patient taking differently: Take 200 mg by mouth at bedtime. ) 180 tablet 0  . nefazodone (SERZONE) 200 MG tablet Take 1-2 po QHS 60 tablet 4  . oxybutynin (DITROPAN-XL) 10 MG 24 hr tablet Take 10 mg by mouth at bedtime.    . SUMAtriptan (IMITREX) 100 MG tablet Take 100 mg by mouth every 2 (two) hours as needed for migraine or headache. May repeat in 2 hours if headache persists or recurs.    . ARIPiprazole (ABILIFY) 5 MG tablet Take 1 tablet (5 mg total) by mouth daily for 30 days. 30 tablet 1  . hydrOXYzine (VISTARIL) 25 MG capsule Take 25 mg by mouth at bedtime as needed for itching.    . memantine (NAMENDA) 10 MG tablet Take 1/2 tab po q am and 1 tab po QHS x 1 week, then increase to 1 tab po BID (Patient not taking: Reported on 04/16/2019) 60 tablet 5   No current facility-administered medications for this visit.     Medication Side Effects: Other: Possible lip licking, dry mouth  Allergies:  Allergies  Allergen Reactions  . Ticlid [Ticlopidine] Shortness Of Breath  . Anti-Inflammatory Enzyme [Nutritional Supplements]   .  Nsaids   . Monistat [Miconazole] Swelling and Rash  . Sulfa Antibiotics Rash    Past Medical History:  Diagnosis Date  . Arthritis   . Depression   . H/O head injury   . Hearing loss   . History of stomach ulcers   . Migraine   . Stroke [facility])     Family History  Problem Relation Age of Onset  . Cancer Father   . Heart disease Brother   . Alcohol abuse Brother   . Depression Maternal Grandmother   . Depression Grandchild     Social History   Socioeconomic History  . Marital status: Married    Spouse name: Not on file  . Number of children: Not on file  . Years of education: Not on file  . Highest education level: Not on file   Occupational History  . Not on file  Social Needs  . Financial resource strain: Not on file  . Food insecurity:    Worry: Not on file    Inability: Not on file  . Transportation needs:    Medical: Not on file    Non-medical: Not on file  Tobacco Use  . Smoking status: Never Smoker  . Smokeless tobacco: Never Used  Substance and Sexual Activity  . Alcohol use: No  . Drug use: No  . Sexual activity: Not on file  Lifestyle  . Physical activity:    Days per week: Not on file    Minutes per session: Not on file  . Stress: Not on file  Relationships  . Social connections:    Talks on phone: Not on file    Gets together: Not on file    Attends religious service: Not on file    Active member of club or organization: Not on file    Attends meetings of clubs or organizations: Not on file    Relationship status: Not on file  . Intimate partner violence:    Fear of current or ex partner: Not on file    Emotionally abused: Not on file    Physically abused: Not on file    Forced sexual activity: Not on file  Other Topics Concern  . Not on file  Social History Narrative  . Not on file    Past Medical History, Surgical history, Social history, and Family history were reviewed and updated as appropriate.   Please see review of systems for further details on the patient's review from today.   Objective:   Physical Exam:  There were no vitals taken for this visit.  Physical Exam Constitutional:      General: She is not in acute distress.    Appearance: She is well-developed.  Musculoskeletal:        General: No deformity.  Neurological:     Mental Status: She is alert and oriented to person, place, and time.     Coordination: Coordination normal.  Psychiatric:        Mood and Affect: Mood is depressed. Mood is not anxious. Affect is not labile, blunt, angry or inappropriate.        Speech: Speech normal.        Behavior: Behavior normal.        Thought Content: Thought  content normal. Thought content does not include homicidal or suicidal ideation. Thought content does not include homicidal or suicidal plan.        Judgment: Judgment normal.     Comments: Insight intact. No auditory or visual  hallucinations. No delusions.      Lab Review:     Component Value Date/Time   NA 140 05/27/2017 1553   K 5.0 05/27/2017 1553   CL 97 05/27/2017 1553   CO2 27 05/27/2017 1553   GLUCOSE 81 05/27/2017 1553   GLUCOSE 109 (H) 12/20/2015 1915   BUN 15 05/27/2017 1553   CREATININE 0.66 05/27/2017 1553   CALCIUM 10.2 05/27/2017 1553   PROT 7.4 05/27/2017 1553   ALBUMIN 4.5 05/27/2017 1553   AST 26 05/27/2017 1553   ALT 20 05/27/2017 1553   ALKPHOS 69 05/27/2017 1553   BILITOT 0.4 05/27/2017 1553   GFRNONAA 90 05/27/2017 1553   GFRAA 104 05/27/2017 1553       Component Value Date/Time   WBC 14.1 (H) 12/20/2015 1905   RBC 4.24 12/20/2015 1905   HGB 14.6 12/20/2015 1915   HCT 43.0 12/20/2015 1915   PLT 253 12/20/2015 1905   MCV 96.2 12/20/2015 1905   MCH 30.7 12/20/2015 1905   MCHC 31.9 12/20/2015 1905   RDW 13.4 12/20/2015 1905    No results found for: POCLITH, LITHIUM   No results found for: PHENYTOIN, PHENOBARB, VALPROATE, CBMZ   .res Assessment: Plan:   Patient seen for 30 minutes and greater than 50% of visit spent counseling patient regarding treatment options and repeated licking of her lips.  Discussed that tardive dyskinesia can manifest as repeated licking of the lips involuntary tongue movements and it is possible that she could be having tardive dyskinesia from the withdrawal of Abilify.  Patient reports that she is not sure if lip licking may be related to anxiety and reports that she has had periods where lip licking had resolved and then returned.  Reports that her mood significantly worsened after stopping Abilify and therefore feels that potential benefits of Abilify outweigh potential risks would like to re-start Abilify due to worsening  mood since stopping Abilify.  Patient advised to contact clinic if she experiences any worsening in repeated licking of her lips. Patient reports that she recently applied for consideration of a co-pay reduction with her insurance for nefazodone and modafinil.  She reports that nefazodone has been most effective for her insomnia and mood. Will restart Abilify 5 mg daily for depression. Continue Cymbalta 120 mg daily for anxiety and depression. Continue nefazodone 200 mg 1-2 at bedtime for depression and insomnia. Patient reports that she plans not to restart memantine since she has not experienced any significant improvement in cognition. Patient to follow-up in 2 months or sooner if clinically indicated. Patient advised to contact office with any questions, adverse effects, or acute worsening in signs and symptoms.  Moderate episode of recurrent major depressive disorder (HCC) - Plan: ARIPiprazole (ABILIFY) 5 MG tablet, DULoxetine (CYMBALTA) 60 MG capsule, modafinil (PROVIGIL) 200 MG tablet  PBA (pseudobulbar affect) - Plan: modafinil (PROVIGIL) 200 MG tablet  Primary insomnia  Please see After Visit Summary for patient specific instructions.  Future Appointments  Date Time Provider [place]  05/26/2019  1:30 PM Thayer Headings, PMHNP CP-CP None    No orders of the defined types were placed in this encounter.     -------------------------------

## 2019-04-19 ENCOUNTER — Telehealth: Payer: Self-pay

## 2019-04-19 NOTE — Telephone Encounter (Signed)
Prior authorization submitted for Nefazodone 200 mg approved effective 04/16/2019-11/11/2019 through [facility]  Prior authorization submitted for modafinil 200 mg denied due to taking off label.

## 2019-04-22 ENCOUNTER — Telehealth: Payer: Self-pay | Admitting: Psychiatry

## 2019-04-22 NOTE — Telephone Encounter (Signed)
[facility] needs Prior Authorization for medication modafinil 200 mg tablets [phone] ref #[id number] press 0 option 3 prescriber then option 3 for additional number

## 2019-04-23 ENCOUNTER — Telehealth: Payer: Self-pay

## 2019-04-23 NOTE — Telephone Encounter (Signed)
Prior authorization approved now for Modafinil 200 mg effective 04/21/2019-04/19/2020. Initially denied on 04/16/2019. Tier reduction submitted as well.

## 2019-04-23 NOTE — Telephone Encounter (Signed)
Spoke with pharmacist through [employer], modafinil 200 mg was denied on 04/16/2019 but then approved on 04/21/2019 through 04/19/2020. They showed a paid claim but they think the pt initiated a tier exception. Given past medications tried for depression. Will notify us by fax of response.

## 2019-05-10 ENCOUNTER — Telehealth: Payer: Self-pay | Admitting: Adult Health

## 2019-05-10 NOTE — Telephone Encounter (Signed)
Patient informed we are still not allowing any visitors or children to come in during appointment time unless physical assistance is needed. Asked if has had any exposure to anyone suspected or confirmed of having COVID-19 or if she was experiencing any of the following, to reschedule: fever, cough, shortness of breath, muscle pain, diarrhea, rash, vomiting, abdominal pain, red eye, weakness, bruising, bleeding, joint pain, or a severe headache.  Stated no to all.  Asked that she complete E-check-in via mychart prior to arrival.  Advised to check-in via Hello Patient and either call our office on arrival in our office parking lot to complete registration over the phone or come in but will then need to wait in the car until the nurse calls for her.  Advised to also use the provided hand sanitizer when entering the office and to wear a mask if she has one, if not, we will provide one. Pt verbalized understanding.   ° ° °

## 2019-05-11 ENCOUNTER — Encounter: Payer: Self-pay | Admitting: Adult Health

## 2019-05-11 ENCOUNTER — Other Ambulatory Visit: Payer: Self-pay

## 2019-05-11 ENCOUNTER — Ambulatory Visit (INDEPENDENT_AMBULATORY_CARE_PROVIDER_SITE_OTHER): Payer: PPO | Admitting: Adult Health

## 2019-05-11 VITALS — Ht 65.0 in

## 2019-05-11 DIAGNOSIS — M545 Low back pain, unspecified: Secondary | ICD-10-CM | POA: Insufficient documentation

## 2019-05-11 MED ORDER — TIZANIDINE HCL 4 MG PO CAPS: 4.0000 mg | ORAL_CAPSULE | Freq: Three times a day (TID) | ORAL | 0 refills | Status: DC | PRN

## 2019-05-11 NOTE — Progress Notes (Signed)
Patient ID: [name], female   DOB: August 16, 1946, 73 y.o.   MRN: [id number]   TELEHEALTH VIRTUAL GYNECOLOGY VISIT ENCOUNTER NOTE  I connected with [name] on 05/11/19 at 11:30 AM EDT by telephone at home and verified that I am speaking with the correct person using two identifiers.   I discussed the limitations, risks, security and privacy concerns of performing an evaluation and management service by telephone and the availability of in person appointments. I also discussed with the patient that there may be a patient responsible charge related to this service. The patient expressed understanding and agreed to proceed.   History:  [name] is a 73 y.o. [phone] white female, married, sp hysterectomy,  being evaluated today for low back pain for about 2 weeks, had slept wrong in a chair and has been helping in the yard and has pain low back, no radiation, and has used ice, but is having spasms. About 2 months ago her back was bothering her and Dr Willey Blade gave her meds that helped some and she had chills, and has tried voltarin with out relief, has used tizanidine in the past and it has worked..She denies any other concerns.    PCP is Dr Willey Blade.    Past Medical History:  Diagnosis Date  . Arthritis   . Depression   . H/O head injury   . Hearing loss   . History of stomach ulcers   . Migraine   . Stroke [facility])    Past Surgical History:  Procedure Laterality Date  . ANKLE FRACTURE SURGERY    . APPENDECTOMY    . BREAST LUMPECTOMY    . CESAREAN SECTION    . PARTIAL HYSTERECTOMY    . ROTATOR CUFF REPAIR     The following portions of the patient's history were reviewed and updated as appropriate: allergies, current medications, past family history, past medical history, past social history, past surgical history and problem list.   Review of Systems:  Pertinent items noted in HPI and remainder of comprehensive ROS otherwise negative.  Physical Exam:   General:  Alert,  oriented and cooperative.   Mental Status: Normal mood and affect perceived. Normal judgment and thought content.  Physical exam deferred due to nature of the encounter Ht 5\' 5"  (1.651 m)   BMI 34.78 kg/m perp tp. Fall risk is low PHQ 2 score is 1.   Labs and Imaging No results found for this or any previous visit (from the past 336 hour(s)). No results found.    Assessment and Plan:     1. Low back pain without sciatica, unspecified back pain laterality, unspecified chronicity  -use ice and rest -will rx tizanidine 4 mg for muscle spasms  Meds ordered this encounter  Medications  . tiZANidine (ZANAFLEX) 4 MG capsule    Sig: Take 1 capsule (4 mg total) by mouth 3 (three) times daily as needed for muscle spasms.    Dispense:  30 capsule    Refill:  0    Please deliver    Order Specific Question:   Supervising Provider    Answer:   Tania Ade H [[id number]]  Follow up prn       I discussed the assessment and treatment plan with the patient. The patient was provided an opportunity to ask questions and all were answered. The patient agreed with the plan and demonstrated an understanding of the instructions.   The patient was advised to call back or seek an  in-person evaluation/go to the ED if the symptoms worsen or if the condition fails to improve as anticipated.  I provided 7 minutes of non-face-to-face time during this encounter.   Derrek Monaco, NP [facility], [place]

## 2019-05-13 ENCOUNTER — Ambulatory Visit: Payer: Self-pay

## 2019-05-13 ENCOUNTER — Encounter: Payer: Self-pay | Admitting: Orthopedic Surgery

## 2019-05-13 ENCOUNTER — Ambulatory Visit (INDEPENDENT_AMBULATORY_CARE_PROVIDER_SITE_OTHER): Payer: PPO

## 2019-05-13 ENCOUNTER — Other Ambulatory Visit: Payer: Self-pay

## 2019-05-13 ENCOUNTER — Ambulatory Visit (INDEPENDENT_AMBULATORY_CARE_PROVIDER_SITE_OTHER): Payer: PPO | Admitting: Orthopedic Surgery

## 2019-05-13 VITALS — Ht 65.0 in | Wt 209.0 lb

## 2019-05-13 DIAGNOSIS — M25872 Other specified joint disorders, left ankle and foot: Secondary | ICD-10-CM

## 2019-05-13 DIAGNOSIS — M6702 Short Achilles tendon (acquired), left ankle: Secondary | ICD-10-CM

## 2019-05-13 DIAGNOSIS — M79672 Pain in left foot: Secondary | ICD-10-CM | POA: Diagnosis not present

## 2019-05-13 DIAGNOSIS — M79671 Pain in right foot: Secondary | ICD-10-CM | POA: Diagnosis not present

## 2019-05-13 DIAGNOSIS — M6701 Short Achilles tendon (acquired), right ankle: Secondary | ICD-10-CM | POA: Diagnosis not present

## 2019-05-14 ENCOUNTER — Encounter: Payer: Self-pay | Admitting: Orthopedic Surgery

## 2019-05-14 NOTE — Progress Notes (Signed)
Office Visit Note   Patient: [name]           Date of Birth: [birth year]           MRN: [id number] Visit Date: 05/13/2019              Requested by: Asencion Noble, MD [address] [place],  [place] [address] PCP: Asencion Noble, MD  Chief Complaint  Patient presents with  . Left Foot - Pain      HPI: Patient is a 73 year old woman who presents complaining of bilateral foot pain.  She also complains of some ankle swelling since March she has been seen by Dr. Aline Brochure and has been placed in a fracture boot to help relieve her symptoms.  Patient complains of plantar fascial pain she denies any injuries.  Assessment & Plan: Visit Diagnoses:  1. Ankle impingement syndrome, left   2. Pain in right foot   3. Achilles tendon contracture, bilateral     Plan: Patient was given instructions and demonstrated Achilles stretching as well as a stiff soled athletic sneaker.  Discussed that if she fails conservative therapy options would include a gastrocnemius recession and possible Weil osteotomy for the second third and fourth metatarsals.  Follow-Up Instructions: Return if symptoms worsen or fail to improve.   Ortho Exam  Patient is alert, oriented, no adenopathy, well-dressed, normal affect, normal respiratory effort. Examination patient has good dorsalis pedis pulses bilaterally.  She has significant Achilles contracture with dorsiflexion about 20 degrees short of neutral on both feet.  She has some very prominent plantar fascia but no plantar fascial nodules.  The plantar fascia is not specifically tender to palpation she does have very prominent metatarsal heads 2 3 and 4 bilaterally with calluses from overloading the forefoot.  She has tenderness to palpation across the midfoot secondary to overloading the forefoot with the Achilles contracture  Imaging: Xr Foot 2 Views Left  Result Date: 05/14/2019 2 view radiographs of the left foot shows previous internal fixation for  fibular fracture she has a long second third and fourth metatarsal with some calcification of the plantar fascia  Xr Foot 2 Views Right  Result Date: 05/14/2019 2 view radiographs of the right foot shows some calcification of the plantar fascia with a long second third and fourth metatarsal no fractures  No images are attached to the encounter.  Labs: No results found for: HGBA1C, ESRSEDRATE, CRP, LABURIC, REPTSTATUS, GRAMSTAIN, CULT, LABORGA   Lab Results  Component Value Date   ALBUMIN 4.5 05/27/2017    No results found for: MG No results found for: VD25OH  No results found for: PREALBUMIN CBC EXTENDED Latest Ref Rng & Units 12/20/2015 12/20/2015  WBC 4.0 - 10.5 K/uL - 14.1(H)  RBC 3.87 - 5.11 MIL/uL - 4.24  HGB 12.0 - 15.0 g/dL 14.6 13.0  HCT 36.0 - 46.0 % 43.0 40.8  PLT 150 - 400 K/uL - 253     Body mass index is 34.78 kg/m.  Orders:  Orders Placed This Encounter  Procedures  . XR Foot 2 Views Left  . XR Foot 2 Views Right   No orders of the defined types were placed in this encounter.    Procedures: No procedures performed  Clinical Data: No additional findings.  ROS:  All other systems negative, except as noted in the HPI. Review of Systems  Objective: Vital Signs: Ht 5\' 5"  (1.651 m)   Wt 209 lb (94.8 kg)   BMI 34.78 kg/m  Specialty Comments:  No specialty comments available.  PMFS History: Patient Active Problem List   Diagnosis Date Noted  . Low back pain without sciatica 05/11/2019  . Arthritis 08/01/2018  . Knee pain, chronic 08/01/2018  . CVA (cerebral vascular accident) ([place]) 08/01/2018  . Hyperlipidemia 08/01/2018  . Migraines 08/01/2018  . Neck pain, chronic 01/13/2014  . Baker's cyst of knee 12/09/2013  . Stiffness of joint, not elsewhere classified, other specified site 03/02/2013  . Weakness of neck 03/02/2013   Past Medical History:  Diagnosis Date  . Arthritis   . Depression   . H/O head injury   . Hearing loss   .  History of stomach ulcers   . Migraine   . Stroke [facility])     Family History  Problem Relation Age of Onset  . Cancer Father   . Heart disease Brother   . Alcohol abuse Brother   . Depression Maternal Grandmother   . Depression Grandchild   . Breast cancer Daughter     Past Surgical History:  Procedure Laterality Date  . ANKLE FRACTURE SURGERY    . APPENDECTOMY    . BREAST LUMPECTOMY    . CESAREAN SECTION    . PARTIAL HYSTERECTOMY    . ROTATOR CUFF REPAIR     Social History   Occupational History  . Occupation: retired  Tobacco Use  . Smoking status: Never Smoker  . Smokeless tobacco: Never Used  Substance and Sexual Activity  . Alcohol use: Yes    Comment: occ beer  . Drug use: No  . Sexual activity: Not Currently    Birth control/protection: Surgical    Comment: hyst

## 2019-05-26 ENCOUNTER — Encounter: Payer: Self-pay | Admitting: Psychiatry

## 2019-05-26 ENCOUNTER — Ambulatory Visit (INDEPENDENT_AMBULATORY_CARE_PROVIDER_SITE_OTHER): Payer: PPO | Admitting: Psychiatry

## 2019-05-26 ENCOUNTER — Other Ambulatory Visit: Payer: Self-pay

## 2019-05-26 VITALS — BP 138/81 | HR 89 | Wt 200.0 lb

## 2019-05-26 DIAGNOSIS — F5101 Primary insomnia: Secondary | ICD-10-CM

## 2019-05-26 DIAGNOSIS — F3341 Major depressive disorder, recurrent, in partial remission: Secondary | ICD-10-CM | POA: Diagnosis not present

## 2019-05-26 MED ORDER — ARIPIPRAZOLE 5 MG PO TABS: 5.0000 mg | ORAL_TABLET | Freq: Every day | ORAL | 1 refills | Status: DC

## 2019-05-26 NOTE — Progress Notes (Signed)
[name] [id number] 24-May-1946 73 y.o.  Subjective:   Patient ID:  [name] is a 73 y.o. (DOB [birth year]) female.  Chief Complaint:  Chief Complaint  Patient presents with  . Follow-up    Medication management  . Sleeping Problem    HPI [name] presents to the office today for follow-up of depression, anxiety, and insomnia.   She reports that she has been having nightmares and awakens in a panic. She reports that nightmare last night was very distressing and that nightmares have been happening periodically, about twice weekly. Reports that nightmares tend to occur around 3:30-4 am. She reports that she has not been able to identify a pattern or trigger to nightmares and has not watched distressing programs prior to bedtime. She reports that she avoids watching news programs and violent TV programs. She reports that sleep quantity has been adequate.   She reports that her mood improved after re-starting Abilify. Denies recent depressed or irritable mood. Denies severe anxiety. Reports that she has had some increased stress with pandemic, riots, son-in-law losing his job, thinking about grandchildren returning to school. She reports that her appetite has been good. She reports that her energy and motivation are lower than she would like. She reports that her concentration is "fair." Denies any change in memory. Denies SI.   She reports that she has not thought about lip licking in the last month- "I guess that is a good thing." Denies any involuntary tongue movement.   She reports that co-pay reductions were approved for Nefazadone and Modafanil.   Review of Systems:  Review of Systems  Musculoskeletal: Negative for gait problem.       Reports knee pain.   Neurological: Negative for tremors and headaches.  Psychiatric/Behavioral:       Please refer to HPI    Medications: I have reviewed the patient's current medications.  Current Outpatient Medications  Medication  Sig Dispense Refill  . ARIPiprazole (ABILIFY) 5 MG tablet Take 1 tablet (5 mg total) by mouth daily. 90 tablet 1  . Cholecalciferol (VITAMIN D-1000 MAX ST) 1000 units tablet Take by mouth.    . DULoxetine (CYMBALTA) 60 MG capsule Take 2 capsules (120 mg total) by mouth at bedtime. 180 capsule 1  . modafinil (PROVIGIL) 200 MG tablet Take 1 tab po q am and 1/2 tab po qd 45 tablet 4  . nefazodone (SERZONE) 200 MG tablet Take 1-2 po QHS 60 tablet 4  . oxybutynin (DITROPAN-XL) 10 MG 24 hr tablet Take 10 mg by mouth at bedtime.    . SUMAtriptan (IMITREX) 100 MG tablet Take 100 mg by mouth every 2 (two) hours as needed for migraine or headache. May repeat in 2 hours if headache persists or recurs.    Marland Kitchen tiZANidine (ZANAFLEX) 4 MG capsule Take 1 capsule (4 mg total) by mouth 3 (three) times daily as needed for muscle spasms. 30 capsule 0   No current facility-administered medications for this visit.     Medication Side Effects: None  Allergies:  Allergies  Allergen Reactions  . Ticlid [Ticlopidine] Shortness Of Breath  . Anti-Inflammatory Enzyme [Nutritional Supplements]   . Nsaids   . Prednisone Other (See Comments)    Makes her stomach hurt--knows this isn't an allergy but doesn't want to take it Other reaction(s): Other (See Comments) Makes her stomach hurt--knows this isn't an allergy but doesn't want to take it   . Monistat [Miconazole] Swelling and Rash  . Sulfa Antibiotics Rash  Past Medical History:  Diagnosis Date  . Arthritis   . Depression   . H/O head injury   . Hearing loss   . History of stomach ulcers   . Migraine   . Stroke [facility])     Family History  Problem Relation Age of Onset  . Cancer Father   . Heart disease Brother   . Alcohol abuse Brother   . Depression Maternal Grandmother   . Depression Grandchild   . Breast cancer Daughter     Social History   Socioeconomic History  . Marital status: Married    Spouse name: Not on file  . Number of children:  Not on file  . Years of education: Not on file  . Highest education level: Not on file  Occupational History  . Occupation: retired  Scientific laboratory technician  . Financial resource strain: Not on file  . Food insecurity    Worry: Not on file    Inability: Not on file  . Transportation needs    Medical: Not on file    Non-medical: Not on file  Tobacco Use  . Smoking status: Never Smoker  . Smokeless tobacco: Never Used  Substance and Sexual Activity  . Alcohol use: Yes    Comment: occ beer  . Drug use: No  . Sexual activity: Not Currently    Birth control/protection: Surgical    Comment: hyst  Lifestyle  . Physical activity    Days per week: Not on file    Minutes per session: Not on file  . Stress: Not on file  Relationships  . Social Herbalist on phone: Not on file    Gets together: Not on file    Attends religious service: Not on file    Active member of club or organization: Not on file    Attends meetings of clubs or organizations: Not on file    Relationship status: Not on file  . Intimate partner violence    Fear of current or ex partner: Not on file    Emotionally abused: Not on file    Physically abused: Not on file    Forced sexual activity: Not on file  Other Topics Concern  . Not on file  Social History Narrative  . Not on file    Past Medical History, Surgical history, Social history, and Family history were reviewed and updated as appropriate.   Please see review of systems for further details on the patient's review from today.   Objective:   Physical Exam:  BP 138/81   Pulse 89   Wt 200 lb (90.7 kg)   BMI 33.28 kg/m   Physical Exam Constitutional:      General: She is not in acute distress.    Appearance: She is well-developed.  Musculoskeletal:        General: No deformity.  Neurological:     Mental Status: She is alert and oriented to person, place, and time.     Coordination: Coordination normal.  Psychiatric:        Attention and  Perception: Attention and perception normal. She does not perceive auditory or visual hallucinations.        Mood and Affect: Mood normal. Mood is not anxious or depressed. Affect is not labile, blunt, angry or inappropriate.        Speech: Speech normal.        Behavior: Behavior normal.        Thought Content: Thought content normal. Thought  content does not include homicidal or suicidal ideation. Thought content does not include homicidal or suicidal plan.        Cognition and Memory: Cognition and memory normal.        Judgment: Judgment normal.     Comments: Insight intact. No delusions.      Lab Review:     Component Value Date/Time   NA 140 05/27/2017 1553   K 5.0 05/27/2017 1553   CL 97 05/27/2017 1553   CO2 27 05/27/2017 1553   GLUCOSE 81 05/27/2017 1553   GLUCOSE 109 (H) 12/20/2015 1915   BUN 15 05/27/2017 1553   CREATININE 0.66 05/27/2017 1553   CALCIUM 10.2 05/27/2017 1553   PROT 7.4 05/27/2017 1553   ALBUMIN 4.5 05/27/2017 1553   AST 26 05/27/2017 1553   ALT 20 05/27/2017 1553   ALKPHOS 69 05/27/2017 1553   BILITOT 0.4 05/27/2017 1553   GFRNONAA 90 05/27/2017 1553   GFRAA 104 05/27/2017 1553       Component Value Date/Time   WBC 14.1 (H) 12/20/2015 1905   RBC 4.24 12/20/2015 1905   HGB 14.6 12/20/2015 1915   HCT 43.0 12/20/2015 1915   PLT 253 12/20/2015 1905   MCV 96.2 12/20/2015 1905   MCH 30.7 12/20/2015 1905   MCHC 31.9 12/20/2015 1905   RDW 13.4 12/20/2015 1905    No results found for: POCLITH, LITHIUM   No results found for: PHENYTOIN, PHENOBARB, VALPROATE, CBMZ   .res Assessment: Plan:   Will continue current plan of care at this time since patient reports improved mood since restart of Abilify and denies any worsening in tongue movements or repetitive licking of lips and is exhibiting decreased lip licking on exam today.  Discussed contacting office if she continues to have persistent nightmares and/or nightmares worsen in frequency or  severity.  Briefly discussed potential benefits, risks, and side effects of an alpha-blocker to suppress nightmares if needed.  Would consider doxazosin if needed for nightmares in the future.  Discussed possible triggers for nightmares and encouraged patient to continue good sleep hygiene practices with reading lighthearted material before bedtime. Will continue nefazodone for insomnia and depression. Continue Cymbalta 120 mg at bedtime for depression and anxiety. Continue modafinil for concentration and cognition. Patient to follow-up in 4 months or sooner if clinically indicated. Patient advised to contact office with any questions, adverse effects, or acute worsening in signs and symptoms.  Winslow was seen today for follow-up and sleeping problem.  Diagnoses and all orders for this visit:  Recurrent major depressive disorder, in partial remission (HCC) -     ARIPiprazole (ABILIFY) 5 MG tablet; Take 1 tablet (5 mg total) by mouth daily.  Primary insomnia     Please see After Visit Summary for patient specific instructions.  No future appointments.  No orders of the defined types were placed in this encounter.   -------------------------------

## 2019-05-26 NOTE — Progress Notes (Signed)
   05/26/19 1345  Facial and Oral Movements  Muscles of Facial Expression 0  Lips and Perioral Area 1  Jaw 0  Tongue 0  Extremity Movements  Upper (arms, wrists, hands, fingers) 0  Lower (legs, knees, ankles, toes) 0  Trunk Movements  Neck, shoulders, hips 0  Overall Severity  Severity of abnormal movements (highest score from questions above) 0  Incapacitation due to abnormal movements 0  Patient's awareness of abnormal movements (rate only patient's report) 0  Dental Status  Current problems with teeth and/or dentures? No  Does patient usually wear dentures? No  AIMS Total Score  AIMS Total Score 1

## 2019-07-01 ENCOUNTER — Other Ambulatory Visit: Payer: Self-pay

## 2019-07-01 DIAGNOSIS — F3342 Major depressive disorder, recurrent, in full remission: Secondary | ICD-10-CM

## 2019-07-01 DIAGNOSIS — F5101 Primary insomnia: Secondary | ICD-10-CM

## 2019-07-01 MED ORDER — NEFAZODONE HCL 200 MG PO TABS: ORAL_TABLET | ORAL | 4 refills | Status: DC

## 2019-07-02 ENCOUNTER — Telehealth: Payer: Self-pay | Admitting: Psychiatry

## 2019-07-02 DIAGNOSIS — F5101 Primary insomnia: Secondary | ICD-10-CM

## 2019-07-02 NOTE — Telephone Encounter (Signed)
Patient called and said that she is having trouble sleeping and would like to know your suggestions. Please give her a call with suggestions. Her pharmacy is [facility] chart[facility] in [place]. B[facility]'s phone number is [phone]

## 2019-07-02 NOTE — Telephone Encounter (Signed)
Case staffed with Dr. Clovis Pu. Recommend increasing Nefazodone to 200 mg 1.5 tabs at bedtime if currently taking only 1 tab at bedtime. If pt is currently taking more than one 200 mg tab of Nefazodone at bedtime and is still having insomnia, recommend starting Belsomra 15 mg po QHS for 10 days, then increasing to 20 mg po QHS if insomnia has not improved. Can provide samples if needed.   Attempted to reach pt. L/M that nurse would try to reach her re: concerns.   Past medication trials: Celexa Pristiq Wellbutrin-hallucinations Effexor Prozac Zoloft Lexapro Nefazodone-effective and well-tolerated Cymbalta Nuedexta Trazodone Vistaril Nuvigil Provigil Memantine

## 2019-07-05 NOTE — Telephone Encounter (Signed)
Braley returned the call today still wanting to talk with you.  Please call

## 2019-07-05 NOTE — Telephone Encounter (Signed)
Left voicemail to call back to discuss

## 2019-07-05 NOTE — Telephone Encounter (Signed)
Still unable to reach patient, left another voicemail to call back to discuss.

## 2019-07-07 MED ORDER — BELSOMRA 20 MG PO TABS: 20.0000 mg | ORAL_TABLET | Freq: Every day | ORAL | 0 refills | Status: DC

## 2019-07-07 MED ORDER — BELSOMRA 15 MG PO TABS: 15.0000 mg | ORAL_TABLET | Freq: Every day | ORAL | 0 refills | Status: DC

## 2019-07-07 NOTE — Telephone Encounter (Signed)
She reports that she is awakening around 3 am and then has repeated awakenings for 3-4 hours a night. She reports that she is not sleeping soundly until it is time to get up. Going to bed between 10-11 pm.   She reports that higher doses of Nefazodone seemed to make her more tired, caused excessive somnolence upon awakening, and not be effective for insomnia.   Discussed potential benefits, risks, and side effects of Belsomra. Will start Belsomra 15 mg po QHS x 9 days, then increase to 20 mg po QHS if 15 mg is ineffective. Will pull samples for pt.   Advised pt to call back to inform staff which dose is effective so that script can be sent to pharmacy. Also advised pt to contact office if Belsomra is not effective so that another option can be tried.

## 2019-07-07 NOTE — Addendum Note (Signed)
Addended by: Sharyl Nimrod on: 07/07/2019 11:52 AM   Modules accepted: Orders

## 2019-07-21 ENCOUNTER — Telehealth: Payer: Self-pay | Admitting: Psychiatry

## 2019-07-21 DIAGNOSIS — F5101 Primary insomnia: Secondary | ICD-10-CM

## 2019-07-21 MED ORDER — GABAPENTIN 300 MG PO CAPS: 300.0000 mg | ORAL_CAPSULE | Freq: Every day | ORAL | 1 refills | Status: DC

## 2019-07-21 NOTE — Telephone Encounter (Signed)
Patient called and said that the sleeping medicine you gave her to try did not work. She slept at the wrong times and had nightmares.She said that if you have any other suggestions for sleep meds she is willing to try.

## 2019-07-21 NOTE — Telephone Encounter (Signed)
Recommend moving administration time of Cymbalta to morning since this could potentially be contributing to insomnia.   Will add Gabapentin 300 mg po QHS for insomnia since this may also help with chronic pain.   Attempted to call patient to discuss treatment options.  Left message for patient that gabapentin would be sent to her pharmacy and that this is indicated for different types of pain, restless leg and is sometimes helpful for insomnia since it does cause some drowsiness.  Discussed that gabapentin may be a safer treatment option compared to some other medications.  Advised that she contact office if she has any questions or if she has tried gabapentin in the past and had any issues with it.  Patient also advised to contact office if gabapentin is not effective or only partially effective for her insomnia.   Past medication trials: Celexa Pristiq Wellbutrin-hallucinations Effexor Prozac Zoloft Lexapro Nefazodone-effective and well-tolerated Cymbalta Nuedexta Trazodone Belsomra-Ineffective, nightmares Vistaril Nuvigil Provigil Memantine

## 2019-08-02 DIAGNOSIS — H903 Sensorineural hearing loss, bilateral: Secondary | ICD-10-CM | POA: Diagnosis not present

## 2019-08-09 ENCOUNTER — Telehealth: Payer: Self-pay | Admitting: Psychiatry

## 2019-08-09 DIAGNOSIS — F5101 Primary insomnia: Secondary | ICD-10-CM

## 2019-08-09 MED ORDER — ESZOPICLONE 2 MG PO TABS: 2.0000 mg | ORAL_TABLET | Freq: Every evening | ORAL | 0 refills | Status: DC | PRN

## 2019-08-09 NOTE — Telephone Encounter (Signed)
[name] called to report that the gabapentin causes her to urinate uncontrollably.  She cannot tolerate this medication.  Hasn't slept since all this started.  Please find something else that hopefully will work.  Next appt 11/18

## 2019-08-09 NOTE — Telephone Encounter (Signed)
Left voicemail to call back and discuss

## 2019-08-16 NOTE — Telephone Encounter (Signed)
Left voice mail to call back 

## 2019-09-08 ENCOUNTER — Other Ambulatory Visit: Payer: Self-pay

## 2019-09-08 DIAGNOSIS — F5101 Primary insomnia: Secondary | ICD-10-CM

## 2019-09-08 MED ORDER — ESZOPICLONE 2 MG PO TABS: 2.0000 mg | ORAL_TABLET | Freq: Every evening | ORAL | 0 refills | Status: DC | PRN

## 2019-09-29 ENCOUNTER — Ambulatory Visit: Payer: PPO | Admitting: Psychiatry

## 2019-10-01 ENCOUNTER — Ambulatory Visit (INDEPENDENT_AMBULATORY_CARE_PROVIDER_SITE_OTHER): Payer: PPO | Admitting: Psychiatry

## 2019-10-01 ENCOUNTER — Encounter: Payer: Self-pay | Admitting: Psychiatry

## 2019-10-01 ENCOUNTER — Ambulatory Visit: Payer: PPO | Admitting: Psychiatry

## 2019-10-01 ENCOUNTER — Other Ambulatory Visit: Payer: Self-pay

## 2019-10-01 DIAGNOSIS — F5101 Primary insomnia: Secondary | ICD-10-CM

## 2019-10-01 DIAGNOSIS — F331 Major depressive disorder, recurrent, moderate: Secondary | ICD-10-CM | POA: Diagnosis not present

## 2019-10-01 DIAGNOSIS — F3341 Major depressive disorder, recurrent, in partial remission: Secondary | ICD-10-CM | POA: Diagnosis not present

## 2019-10-01 DIAGNOSIS — F482 Pseudobulbar affect: Secondary | ICD-10-CM

## 2019-10-01 DIAGNOSIS — F3342 Major depressive disorder, recurrent, in full remission: Secondary | ICD-10-CM

## 2019-10-01 MED ORDER — ARIPIPRAZOLE 5 MG PO TABS: 5.0000 mg | ORAL_TABLET | Freq: Every day | ORAL | 1 refills | Status: DC

## 2019-10-01 MED ORDER — NEFAZODONE HCL 200 MG PO TABS: ORAL_TABLET | ORAL | 4 refills | Status: DC

## 2019-10-01 MED ORDER — DULOXETINE HCL 60 MG PO CPEP: 120.0000 mg | ORAL_CAPSULE | Freq: Every day | ORAL | 1 refills | Status: DC

## 2019-10-01 MED ORDER — ESZOPICLONE 2 MG PO TABS: 2.0000 mg | ORAL_TABLET | Freq: Every evening | ORAL | 3 refills | Status: DC | PRN

## 2019-10-01 MED ORDER — MODAFINIL 200 MG PO TABS: ORAL_TABLET | ORAL | 4 refills | Status: DC

## 2019-10-01 NOTE — Progress Notes (Signed)
ICESS LEATON [id number] [birth year] 73 y.o.  Subjective:   Patient ID:  [name] is a 73 y.o. (DOB 1945-12-08) female.  Chief Complaint:  Chief Complaint  Patient presents with  . Follow-up    Depression, Anxiety, and Insomnia    HPI [name] presents to the office today for follow-up of depression and insomnia.  She reports, "I have lots of things to do, but I don't want to do them." Energy has also been low. She reports some depression with periods of worsening depression. She reports that her sleep has improved and is usually only waking up one time a night at most. Sleeping from 10 pm until at least 7 am. Denies excessive irritability. Reports some worry about her daughter and her family, since son-in-law is unemployed. She notices shaking when she feels anxious. Denies panic attacks. Appetite has been decreased and not wanting as much food as usual. She reports poor concentration and word finding difficulties. She reports limited interest and enjoyment in things. Denies SI.   A friend of hers is actively dying. She reports that her husband has been more "crotchety." Son-in-law lost his job due to the pandemic. Has been avoiding watching the news and action movies. Prefers [employer] movies.   Reports that her lips have been dry. Reports that she frequently licks her lips and at times is aware she is doing it and other times is not.   Had some friends over for dinner in October and enjoyed this.   Taking one Nefazodone at bedtime.   Past medication trials: Celexa Pristiq Wellbutrin-hallucinations Effexor Prozac Zoloft Lexapro Nefazodone-effective and well-tolerated Cymbalta Abilify Nuedexta Trazodone Gabapentin- Disrupted sleep schedules, nightmares.  Vistaril Nuvigil Provigil Memantine Lunesta- Effective for insomnia   Review of Systems:  Review of Systems  Musculoskeletal: Positive for arthralgias. Negative for gait problem.  Neurological: Negative  for tremors.  Psychiatric/Behavioral:       Please refer to HPI    Medications: I have reviewed the patient's current medications.  Current Outpatient Medications  Medication Sig Dispense Refill  . DULoxetine (CYMBALTA) 60 MG capsule Take 2 capsules (120 mg total) by mouth at bedtime. 180 capsule 1  . [START ON 10/06/2019] eszopiclone (LUNESTA) 2 MG TABS tablet Take 1 tablet (2 mg total) by mouth at bedtime as needed for sleep. Take immediately before bedtime 30 tablet 3  . modafinil (PROVIGIL) 200 MG tablet Take 1 tab po q am and 1/2 tab po qd 45 tablet 4  . nefazodone (SERZONE) 200 MG tablet Take 1-2 po QHS 60 tablet 4  . oxybutynin (DITROPAN-XL) 10 MG 24 hr tablet Take 10 mg by mouth at bedtime.    . SUMAtriptan (IMITREX) 100 MG tablet Take 100 mg by mouth every 2 (two) hours as needed for migraine or headache. May repeat in 2 hours if headache persists or recurs.    . vitamin E 100 UNIT capsule Take by mouth daily.    . ARIPiprazole (ABILIFY) 5 MG tablet Take 1 tablet (5 mg total) by mouth daily. 90 tablet 1  . tiZANidine (ZANAFLEX) 4 MG capsule Take 1 capsule (4 mg total) by mouth 3 (three) times daily as needed for muscle spasms. (Patient not taking: Reported on 10/01/2019) 30 capsule 0   No current facility-administered medications for this visit.     Medication Side Effects: None  Allergies:  Allergies  Allergen Reactions  . Ticlid [Ticlopidine] Shortness Of Breath  . Anti-Inflammatory Enzyme [Nutritional Supplements]   . Nsaids   .  Prednisone Other (See Comments)    Makes her stomach hurt--knows this isn't an allergy but doesn't want to take it Other reaction(s): Other (See Comments) Makes her stomach hurt--knows this isn't an allergy but doesn't want to take it   . Monistat [Miconazole] Swelling and Rash  . Sulfa Antibiotics Rash    Past Medical History:  Diagnosis Date  . Arthritis   . Depression   . H/O head injury   . Hearing loss   . History of stomach  ulcers   . Migraine   . Stroke [facility])     Family History  Problem Relation Age of Onset  . Cancer Father   . Heart disease Brother   . Alcohol abuse Brother   . Depression Maternal Grandmother   . Depression Grandchild   . Breast cancer Daughter     Social History   Socioeconomic History  . Marital status: Married    Spouse name: Not on file  . Number of children: Not on file  . Years of education: Not on file  . Highest education level: Not on file  Occupational History  . Occupation: retired  Scientific laboratory technician  . Financial resource strain: Not on file  . Food insecurity    Worry: Not on file    Inability: Not on file  . Transportation needs    Medical: Not on file    Non-medical: Not on file  Tobacco Use  . Smoking status: Never Smoker  . Smokeless tobacco: Never Used  Substance and Sexual Activity  . Alcohol use: Yes    Comment: occ beer  . Drug use: No  . Sexual activity: Not Currently    Birth control/protection: Surgical    Comment: hyst  Lifestyle  . Physical activity    Days per week: Not on file    Minutes per session: Not on file  . Stress: Not on file  Relationships  . Social Herbalist on phone: Not on file    Gets together: Not on file    Attends religious service: Not on file    Active member of club or organization: Not on file    Attends meetings of clubs or organizations: Not on file    Relationship status: Not on file  . Intimate partner violence    Fear of current or ex partner: Not on file    Emotionally abused: Not on file    Physically abused: Not on file    Forced sexual activity: Not on file  Other Topics Concern  . Not on file  Social History Narrative  . Not on file    Past Medical History, Surgical history, Social history, and Family history were reviewed and updated as appropriate.   Please see review of systems for further details on the patient's review from today.   Objective:   Physical Exam:  BP 123/60    Pulse 72   Physical Exam  Lab Review:     Component Value Date/Time   NA 140 05/27/2017 1553   K 5.0 05/27/2017 1553   CL 97 05/27/2017 1553   CO2 27 05/27/2017 1553   GLUCOSE 81 05/27/2017 1553   GLUCOSE 109 (H) 12/20/2015 1915   BUN 15 05/27/2017 1553   CREATININE 0.66 05/27/2017 1553   CALCIUM 10.2 05/27/2017 1553   PROT 7.4 05/27/2017 1553   ALBUMIN 4.5 05/27/2017 1553   AST 26 05/27/2017 1553   ALT 20 05/27/2017 1553   ALKPHOS 69 05/27/2017 1553  BILITOT 0.4 05/27/2017 1553   GFRNONAA 90 05/27/2017 1553   GFRAA 104 05/27/2017 1553       Component Value Date/Time   WBC 14.1 (H) 12/20/2015 1905   RBC 4.24 12/20/2015 1905   HGB 14.6 12/20/2015 1915   HCT 43.0 12/20/2015 1915   PLT 253 12/20/2015 1905   MCV 96.2 12/20/2015 1905   MCH 30.7 12/20/2015 1905   MCHC 31.9 12/20/2015 1905   RDW 13.4 12/20/2015 1905    No results found for: POCLITH, LITHIUM   No results found for: PHENYTOIN, PHENOBARB, VALPROATE, CBMZ   .res Assessment: Plan:   Pt reports that she would prefer to continue current medications without changes since she feels recent sadness and anxiety are in response to situational stressors and are expected. She reports that she will contact office if s/s worsen or do not improve.  Discussed taking Lunesta only as needed to minimize risks for dependence and tolerance. Will continue Abilify, Cymbalta, Modafanil, and Nefazodone. Pt to f/u in 4 months or sooner if clinically indicated.  Patient advised to contact office with any questions, adverse effects, or acute worsening in signs and symptoms.  [name] was seen today for follow-up.  Diagnoses and all orders for this visit:  Recurrent major depressive disorder, in partial remission (HCC) -     ARIPiprazole (ABILIFY) 5 MG tablet; Take 1 tablet (5 mg total) by mouth daily.  Moderate episode of recurrent major depressive disorder (HCC) -     DULoxetine (CYMBALTA) 60 MG capsule; Take 2 capsules (120 mg  total) by mouth at bedtime. -     modafinil (PROVIGIL) 200 MG tablet; Take 1 tab po q am and 1/2 tab po qd  Primary insomnia -     eszopiclone (LUNESTA) 2 MG TABS tablet; Take 1 tablet (2 mg total) by mouth at bedtime as needed for sleep. Take immediately before bedtime -     nefazodone (SERZONE) 200 MG tablet; Take 1-2 po QHS  PBA (pseudobulbar affect) -     modafinil (PROVIGIL) 200 MG tablet; Take 1 tab po q am and 1/2 tab po qd  Recurrent major depressive disorder, in full remission ([place]) -     nefazodone (SERZONE) 200 MG tablet; Take 1-2 po QHS     Please see After Visit Summary for patient specific instructions.  Future Appointments  Date Time Provider [place]  02/02/2020 11:30 AM Thayer Headings, PMHNP [facility] None    No orders of the defined types were placed in this encounter.   -------------------------------

## 2019-10-14 ENCOUNTER — Other Ambulatory Visit: Payer: Self-pay

## 2019-10-14 DIAGNOSIS — Z20822 Contact with and (suspected) exposure to covid-19: Secondary | ICD-10-CM

## 2019-10-16 LAB — NOVEL CORONAVIRUS, NAA: SARS-CoV-2, NAA: NOT DETECTED

## 2019-10-18 ENCOUNTER — Ambulatory Visit: Payer: Self-pay

## 2019-10-18 NOTE — Telephone Encounter (Signed)
Provided  Covid -19 test results and  Care advise.  Voiced understanding.

## 2019-10-19 ENCOUNTER — Telehealth: Payer: Self-pay | Admitting: Psychiatry

## 2019-10-19 NOTE — Telephone Encounter (Signed)
Pt would like to know if she should be taking Abilify right now?

## 2019-10-19 NOTE — Telephone Encounter (Signed)
Patient calling to clarify her medication list. Confirmed with her she is suppose to be taking Abilify 5 mg daily. Verbalized understanding.

## 2019-10-21 DIAGNOSIS — M542 Cervicalgia: Secondary | ICD-10-CM | POA: Diagnosis not present

## 2019-10-26 DIAGNOSIS — M542 Cervicalgia: Secondary | ICD-10-CM | POA: Diagnosis not present

## 2019-11-02 ENCOUNTER — Telehealth: Payer: Self-pay | Admitting: Psychiatry

## 2019-11-02 ENCOUNTER — Other Ambulatory Visit: Payer: Self-pay | Admitting: Psychiatry

## 2019-11-02 DIAGNOSIS — F3342 Major depressive disorder, recurrent, in full remission: Secondary | ICD-10-CM

## 2019-11-02 DIAGNOSIS — F5101 Primary insomnia: Secondary | ICD-10-CM

## 2019-11-02 MED ORDER — NEFAZODONE HCL 100 MG PO TABS: ORAL_TABLET | ORAL | 0 refills | Status: DC

## 2019-11-02 NOTE — Telephone Encounter (Signed)
[name] called because she has been unable to get the nefazodone because no pharmacy seems to have it.  She has called around and so has her pharmacy and it can't be found.  She is starting to get a headache.  What should she do?

## 2019-11-02 NOTE — Telephone Encounter (Signed)
I will send in the prescription of nefazodone for her to [facility] but this is only a temporary solution.  She needs to schedule an appointment with her provider and find an alternative if she still needs 1 because she will have to stop the nefazodone since it is no longer made.

## 2019-11-02 NOTE — Telephone Encounter (Signed)
Nefazodone is no longer being made.  Try [facility] and see if they can get a few for her to taper off of it.  If that is not available or possible, the best next option would be mirtazapine 30 mg 1 nightly.  It does not look like she has taken that medicine before and it is pretty well tolerated and people that are med sensitive.  It looks like from her med list that she has not done well with SSRIs and mirtazapine is not an SSRI and it's easy to start and stop it.  Might make her a little sleeepy.

## 2019-11-02 NOTE — Telephone Encounter (Signed)
The other option if she prefers is trazodone 150 mg HS but I see she took it before

## 2019-11-03 NOTE — Telephone Encounter (Signed)
OK to stay off it if desired and if she has tolerated the withdrawal off the meds.  She will need to discuss with Janett Billow whether something should rpelace it like mirtazapine or not.

## 2019-11-03 NOTE — Telephone Encounter (Signed)
FYI, Patient called back after contacting [employer] to check price of Nefazodone and was quoted $130.00. She normally pays $30.00 and at this point she's been off of them 5-6 days so no sense in restarting and paying that amount. She also wanted to mention the female pharmacy tech she spoke with was very rude with her and wanted to let us know. I informed her that the pharmacist there has always been very helpful for [place] and friendly and I'm sorry she had that experience with their pharmacy tech.

## 2019-11-03 NOTE — Telephone Encounter (Signed)
Left patient a voice mail

## 2019-11-03 NOTE — Telephone Encounter (Signed)
Left patient voicemail to call back 

## 2019-11-17 ENCOUNTER — Ambulatory Visit (HOSPITAL_COMMUNITY): Payer: PPO | Attending: Internal Medicine | Admitting: Physical Therapy

## 2019-11-17 ENCOUNTER — Other Ambulatory Visit: Payer: Self-pay

## 2019-11-17 ENCOUNTER — Encounter (HOSPITAL_COMMUNITY): Payer: Self-pay | Admitting: Physical Therapy

## 2019-11-17 DIAGNOSIS — M542 Cervicalgia: Secondary | ICD-10-CM | POA: Insufficient documentation

## 2019-11-17 DIAGNOSIS — M25611 Stiffness of right shoulder, not elsewhere classified: Secondary | ICD-10-CM | POA: Insufficient documentation

## 2019-11-17 NOTE — Therapy (Signed)
[place], [place], [address] Phone: [phone]   Fax:  [phone]  Physical Therapy Evaluation  Patient Details  Name: [name] MRN: [id number] Date of Birth: Jan 15, 1946 Referring Provider (PT): Asencion Noble, MD   Encounter Date: 11/17/2019  PT End of Session - 11/17/19 1606    Visit Number  1    Number of Visits  12    Date for PT Re-Evaluation  12/29/19    Authorization Type  [employer] (visits based on medical necessity)    Authorization Time Period  11/17/19 - 12/29/19 Progress note due visit 10    Authorization - Visit Number  1    Authorization - Number of Visits  10    PT Start Time  [id number]    PT Stop Time  1430    PT Time Calculation (min)  45 min    Activity Tolerance  Patient tolerated treatment well;Patient limited by pain    Behavior During Therapy  [facility] for tasks assessed/performed       Past Medical History:  Diagnosis Date  . Arthritis   . Depression   . H/O head injury   . Hearing loss   . History of stomach ulcers   . Migraine   . Stroke [facility])     Past Surgical History:  Procedure Laterality Date  . ANKLE FRACTURE SURGERY    . APPENDECTOMY    . BREAST LUMPECTOMY    . CESAREAN SECTION    . PARTIAL HYSTERECTOMY    . ROTATOR CUFF REPAIR      There were no vitals filed for this visit.   Subjective Assessment - 11/17/19 1359    Subjective  Reported that she was reaching up for a plate and that this was when she felt that she had pulled something in her neck. Patient reported that the CT showed that she has bone spurs on the right side of her neck. She said that it hurts to stretch it to the opposite side or to turn it. She reported that she has been taking some medicine for it and this has been helping. She said the pain has been going on for 3 weeks. She reported that she is having difficulty with turning her head which is making driving more difficult. She reported that it  radiated down her arm 1 time but that it is not radiating any more. Patient denied tingling or numbness. Reported a history of migraines but doing better recently. Finds that she is leaning her head to the right some to "take the pressure off".    Limitations  House hold activities;Other (comment)   Driving   Diagnostic tests  MRI from 02/17/14    Patient Stated Goals  To have less pain    Currently in Pain?  Yes    Pain Score  3     Pain Location  Neck    Pain Orientation  Right    Pain Descriptors / Indicators  Aching    Pain Type  Acute pain    Pain Onset  1 to 4 weeks ago    Pain Frequency  Constant    Aggravating Factors   Turning head    Pain Relieving Factors  Ice, hot pack    Effect of Pain on Daily Activities  moderately affects         [facility] PT Assessment - 11/17/19 0001      Assessment   Medical Diagnosis  Neck  Pain    Referring Provider (PT)  Asencion Noble, MD    Hand Dominance  Right    Next MD Visit  None scheduled    Prior Therapy  Yes, for back and leg      Precautions   Precautions  None      Restrictions   Weight Bearing Restrictions  No      Balance Screen   Has the patient fallen in the past 6 months  No    Has the patient had a decrease in activity level because of a fear of falling?   No    Is the patient reluctant to leave their home because of a fear of falling?   No      Home Environment   Living Environment  Private residence    Type of [place]      Prior Function   Level of [place]  Retired    Midwife by number      Cognition   Overall Cognitive Status  Within Functional Limits for tasks assessed      Observation/Other Assessments   Focus on Therapeutic Outcomes (FOTO)   45% limited      Sensation   Light Touch  Appears Intact      Posture/Postural Control   Posture/Postural Control  Postural limitations    Postural Limitations  Forward head;Rounded Shoulders      ROM / Strength   AROM /  PROM / Strength  AROM;Strength      AROM   Overall AROM Comments  Observed 10% bilateral shoulder flexion AROM limitation. Difficulty with IR hand reaching behind back to T12     AROM Assessment Site  Cervical    Cervical Flexion  20    Cervical Extension  15   painful   Cervical - Right Side Bend  15   painful   Cervical - Left Side Bend  12   painful   Cervical - Right Rotation  36   Painful   Cervical - Left Rotation  35   Painful     Palpation   Palpation comment  Muscular restrictions noted through right side of cervical paraspinals. Tender to palpation to lateral upper trapezius and levator scapulae near insertion. Increased pain "pulling feeling" with distraction.                 Objective measurements completed on examination: See above findings.              PT Education - 11/17/19 1606    Education Details  Discussed examination findings and POC and to continue gentle ROM exercises.    Person(s) Educated  Patient    Methods  Explanation    Comprehension  Verbalized understanding       PT Short Term Goals - 11/17/19 1613      PT SHORT TERM GOAL #1   Title  Patient will report understanding and regular compliance with HEP to improve ROM, decrease pain, and improve overall functional mobility.    Time  3    Period  Weeks    Status  New    Target Date  12/08/19        PT Long Term Goals - 11/17/19 1615      PT LONG TERM GOAL #1   Title  Pt will demonstrate full, pain free cervical AROM to increase her safety and ability to drive in the community.  Time  6    Period  Weeks    Status  New    Target Date  12/29/19      PT LONG TERM GOAL #2   Title  Patient will demonstrate ability to reach behind back to mid-back with decreased pain for ability to don doff clothing with improved ease.    Time  6    Period  Weeks    Status  New    Target Date  12/29/19      PT LONG TERM GOAL #3   Title  Patient will report that her neck pain has  improved by at least 75% to improve ability to perform daily activities.    Time  6    Period  Weeks    Status  New    Target Date  12/29/19      PT LONG TERM GOAL #4   Title  Patient will demonstrate improvement on FOTO of at least 10% indicating improved percieved functional mobility.    Time  6    Period  Weeks    Status  New    Target Date  12/29/19             Plan - 11/17/19 1628    Clinical Impression Statement  Patient is a 74 year old female who presented to outpatient physical therapy for evaluation with primary complaint of neck pain which has been ongoing for past few weeks. Upon examination, patient demonstrated decreased cervical ROM, decreased shoulder ROM, as well as increased pain with movement. Upon palpation, patient reported tenderness to palpation particularly in right lateral aspect of trapezius and levator scapulae. Patient had increased pain with cervical distraction. Patient would benefit from continued skilled physical therapy in order to address the abovementioned deficits and help patient return to prior level of function.    Personal Factors and Comorbidities  Age;Comorbidity 2    Comorbidities  Hyperlipidemia, depression    Examination-Activity Limitations  Lift;Reach Overhead;Dressing;Hygiene/Grooming    Examination-Participation Restrictions  Driving;Community Activity    Stability/Clinical Decision Making  Stable/Uncomplicated    Clinical Decision Making  Low    Rehab Potential  Fair    PT Frequency  2x / week    PT Duration  6 weeks    PT Treatment/Interventions  ADLs/Self Care Home Management;Cryotherapy;Electrical Stimulation;Moist Heat;Traction;Functional mobility training;Therapeutic activities;Therapeutic exercise;Neuromuscular re-education;Patient/family education;Manual techniques;Passive range of motion;Dry needling;Energy conservation;Taping    PT Next Visit Plan  Review eval/goals. Initiate HEP. Perform STM to decrease pain and promote  relaxation.    PT Home Exercise Plan  Continue gentle ROM    Consulted and Agree with Plan of Care  Patient       Patient will benefit from skilled therapeutic intervention in order to improve the following deficits and impairments:  Pain, Decreased mobility, Postural dysfunction, Decreased activity tolerance, Decreased endurance, Decreased range of motion, Decreased strength  Visit Diagnosis: Cervicalgia  Stiffness of right shoulder, not elsewhere classified     Problem List Patient Active Problem List   Diagnosis Date Noted  . Low back pain without sciatica 05/11/2019  . Arthritis 08/01/2018  . Knee pain, chronic 08/01/2018  . CVA (cerebral vascular accident) ([place]) 08/01/2018  . Hyperlipidemia 08/01/2018  . Migraines 08/01/2018  . Neck pain, chronic 01/13/2014  . Baker's cyst of knee 12/09/2013  . Stiffness of joint, not elsewhere classified, other specified site 03/02/2013  . Weakness of neck 03/02/2013   Clarene Critchley PT, DPT 4:43 PM, 11/17/19 [place]  [facility] [place], [place], [address] Phone: [phone]   Fax:  [phone]  Name: ARIEON DOLDER MRN: [id number] Date of Birth: 1946-01-13

## 2019-11-18 ENCOUNTER — Ambulatory Visit (HOSPITAL_COMMUNITY): Payer: PPO | Admitting: Physical Therapy

## 2019-11-18 ENCOUNTER — Encounter (HOSPITAL_COMMUNITY): Payer: Self-pay | Admitting: Physical Therapy

## 2019-11-18 DIAGNOSIS — M542 Cervicalgia: Secondary | ICD-10-CM

## 2019-11-18 DIAGNOSIS — M25611 Stiffness of right shoulder, not elsewhere classified: Secondary | ICD-10-CM

## 2019-11-18 NOTE — Patient Instructions (Signed)
[place] access code: [email or web address]

## 2019-11-18 NOTE — Therapy (Signed)
[place], [place], [address] Phone: [phone]   Fax:  [phone]  Physical Therapy Treatment  Patient Details  Name: [name] MRN: [id number] Date of Birth: 1946-03-21 Referring Provider (PT): Asencion Noble, MD   Encounter Date: 11/18/2019  PT End of Session - 11/18/19 1523    Visit Number  2    Number of Visits  12    Date for PT Re-Evaluation  12/29/19    Authorization Type  [employer] (visits based on medical necessity)    Authorization Time Period  11/17/19 - 12/29/19 Progress note due visit 10    Authorization - Visit Number  2    Authorization - Number of Visits  10    PT Start Time  1435    PT Stop Time  1515    PT Time Calculation (min)  40 min    Activity Tolerance  Patient tolerated treatment well;Patient limited by pain    Behavior During Therapy  [facility] for tasks assessed/performed       Past Medical History:  Diagnosis Date  . Arthritis   . Depression   . H/O head injury   . Hearing loss   . History of stomach ulcers   . Migraine   . Stroke [facility])     Past Surgical History:  Procedure Laterality Date  . ANKLE FRACTURE SURGERY    . APPENDECTOMY    . BREAST LUMPECTOMY    . CESAREAN SECTION    . PARTIAL HYSTERECTOMY    . ROTATOR CUFF REPAIR      There were no vitals filed for this visit.  Subjective Assessment - 11/18/19 1437    Subjective  Reported she slept wrong and has neck pain now.    Limitations  House hold activities;Other (comment)   Driving   Diagnostic tests  MRI from 02/17/14    Patient Stated Goals  To have less pain    Currently in Pain?  Yes    Pain Score  4     Pain Location  Neck    Pain Orientation  Right    Pain Descriptors / Indicators  Aching    Pain Onset  1 to 4 weeks ago                       [facility] Adult PT Treatment/Exercise - 11/18/19 0001      Exercises   Exercises  Neck      Neck Exercises: Seated   Neck Retraction  15  reps;5 secs    W Back  15 reps    Other Seated Exercise  Scapular retraction 15x     Other Seated Exercise  Trapezius stretch 4x15''       Manual Therapy   Manual Therapy  Soft tissue mobilization    Manual therapy comments  performed separately from all other interventions    Soft tissue mobilization  STM to patient's levator scapulae and trapezius muscles RT>LT to patient's tolerance, trigger point release as needed             PT Education - 11/18/19 1521    Education Details  Discussed HEP and discussed purpose and technique of interventions throughout.    Person(s) Educated  Patient    Methods  Explanation;Handout    Comprehension  Verbalized understanding       PT Short Term Goals - 11/18/19 1438      PT SHORT TERM GOAL #1  Title  Patient will report understanding and regular compliance with HEP to improve ROM, decrease pain, and improve overall functional mobility.    Time  3    Period  Weeks    Status  New    Target Date  12/08/19        PT Long Term Goals - 11/18/19 1438      PT LONG TERM GOAL #1   Title  Pt will demonstrate full, pain free cervical AROM to increase her safety and ability to drive in the community.     Time  6    Period  Weeks    Status  On-going      PT LONG TERM GOAL #2   Title  Patient will demonstrate ability to reach behind back to mid-back with decreased pain for ability to don doff clothing with improved ease.    Time  6    Period  Weeks    Status  On-going      PT LONG TERM GOAL #3   Title  Patient will report that her neck pain has improved by at least 75% to improve ability to perform daily activities.    Time  6    Period  Weeks    Status  On-going      PT LONG TERM GOAL #4   Title  Patient will demonstrate improvement on FOTO of at least 10% indicating improved percieved functional mobility.    Time  6    Period  Weeks    Status  On-going            Plan - 11/18/19 1527    Clinical Impression Statement   Began session by reviewing patient's goals and proving information on initial HEP. Patient required verbal cues with exercises for proper performance, however, was then able to perform independently. Ended session with soft tissue mobilization to patient's tolerance and performed trigger point release to noted areas of tension. Patient reported feeling good at end of session.    Personal Factors and Comorbidities  Age;Comorbidity 2    Comorbidities  Hyperlipidemia, depression    Examination-Activity Limitations  Lift;Reach Overhead;Dressing;Hygiene/Grooming    Examination-Participation Restrictions  Driving;Community Activity    Stability/Clinical Decision Making  Stable/Uncomplicated    Rehab Potential  Fair    PT Frequency  2x / week    PT Duration  6 weeks    PT Treatment/Interventions  ADLs/Self Care Home Management;Cryotherapy;Electrical Stimulation;Moist Heat;Traction;Functional mobility training;Therapeutic activities;Therapeutic exercise;Neuromuscular re-education;Patient/family education;Manual techniques;Passive range of motion;Dry needling;Energy conservation;Taping    PT Next Visit Plan  Review eval/goals. Initiate HEP. Perform STM to decrease pain and promote relaxation.    PT Home Exercise Plan  Continue gentle ROM; 11/18/19: Trap stretch, cervical retraction    Consulted and Agree with Plan of Care  Patient       Patient will benefit from skilled therapeutic intervention in order to improve the following deficits and impairments:  Pain, Decreased mobility, Postural dysfunction, Decreased activity tolerance, Decreased endurance, Decreased range of motion, Decreased strength  Visit Diagnosis: Cervicalgia  Stiffness of right shoulder, not elsewhere classified     Problem List Patient Active Problem List   Diagnosis Date Noted  . Low back pain without sciatica 05/11/2019  . Arthritis 08/01/2018  . Knee pain, chronic 08/01/2018  . CVA (cerebral vascular accident) ([place])  08/01/2018  . Hyperlipidemia 08/01/2018  . Migraines 08/01/2018  . Neck pain, chronic 01/13/2014  . Baker's cyst of knee 12/09/2013  . Stiffness of joint, not  elsewhere classified, other specified site 03/02/2013  . Weakness of neck 03/02/2013   Clarene Critchley PT, DPT 3:28 PM, 11/18/19 [place], [place], [address] Phone: [phone]   Fax:  [phone]  Name: [name] MRN: [id number] Date of Birth: 1946/09/18

## 2019-11-23 ENCOUNTER — Other Ambulatory Visit: Payer: Self-pay

## 2019-11-23 ENCOUNTER — Encounter (HOSPITAL_COMMUNITY): Payer: Self-pay | Admitting: Physical Therapy

## 2019-11-23 ENCOUNTER — Ambulatory Visit (HOSPITAL_COMMUNITY): Payer: PPO | Admitting: Physical Therapy

## 2019-11-23 DIAGNOSIS — M542 Cervicalgia: Secondary | ICD-10-CM | POA: Diagnosis not present

## 2019-11-23 DIAGNOSIS — M25611 Stiffness of right shoulder, not elsewhere classified: Secondary | ICD-10-CM

## 2019-11-23 NOTE — Therapy (Signed)
[place], [place], [address] Phone: [phone]   Fax:  [phone]  Physical Therapy Treatment  Patient Details  Name: [name] MRN: [id number] Date of Birth: 1946/07/07 Referring Provider (PT): Asencion Noble, MD   Encounter Date: 11/23/2019  PT End of Session - 11/23/19 1310    Visit Number  3    Number of Visits  12    Date for PT Re-Evaluation  12/29/19    Authorization Type  [employer] (visits based on medical necessity)    Authorization Time Period  11/17/19 - 12/29/19 Progress note due visit 10    Authorization - Visit Number  3    Authorization - Number of Visits  10    PT Start Time  [id number]    PT Stop Time  1345    PT Time Calculation (min)  38 min    Activity Tolerance  Patient tolerated treatment well;Patient limited by pain    Behavior During Therapy  [facility] for tasks assessed/performed       Past Medical History:  Diagnosis Date  . Arthritis   . Depression   . H/O head injury   . Hearing loss   . History of stomach ulcers   . Migraine   . Stroke [facility])     Past Surgical History:  Procedure Laterality Date  . ANKLE FRACTURE SURGERY    . APPENDECTOMY    . BREAST LUMPECTOMY    . CESAREAN SECTION    . PARTIAL HYSTERECTOMY    . ROTATOR CUFF REPAIR      There were no vitals filed for this visit.  Subjective Assessment - 11/23/19 1309    Subjective  Patient reported doing well. She stated she does not have pain and reported she is able to turn her head to the right more now.    Limitations  House hold activities;Other (comment)   Driving   Diagnostic tests  MRI from 02/17/14    Patient Stated Goals  To have less pain    Currently in Pain?  No/denies                       [facility] Adult PT Treatment/Exercise - 11/23/19 0001      Neck Exercises: Theraband   Shoulder Extension  Red;15 reps    Rows  Red;15 reps      Neck Exercises: Standing   Other Standing Exercises   Y liftoff 15x       Neck Exercises: Seated   Neck Retraction  15 reps;5 secs    W Back  15 reps    Other Seated Exercise  Scapular retraction 15x     Other Seated Exercise  Trapezius stretch 4x15''       Manual Therapy   Manual Therapy  Soft tissue mobilization    Manual therapy comments  performed separately from all other interventions    Soft tissue mobilization  STM to patient's levator scapulae and trapezius muscles LT>RT to patient's tolerance, trigger point release as needed             PT Education - 11/23/19 1347    Education Details  Discussed purpose and technique of interventions throughout session.    Person(s) Educated  Patient    Methods  Explanation    Comprehension  Verbalized understanding       PT Short Term Goals - 11/18/19 1438      PT SHORT TERM GOAL #  1   Title  Patient will report understanding and regular compliance with HEP to improve ROM, decrease pain, and improve overall functional mobility.    Time  3    Period  Weeks    Status  New    Target Date  12/08/19        PT Long Term Goals - 11/18/19 1438      PT LONG TERM GOAL #1   Title  Pt will demonstrate full, pain free cervical AROM to increase her safety and ability to drive in the community.     Time  6    Period  Weeks    Status  On-going      PT LONG TERM GOAL #2   Title  Patient will demonstrate ability to reach behind back to mid-back with decreased pain for ability to don doff clothing with improved ease.    Time  6    Period  Weeks    Status  On-going      PT LONG TERM GOAL #3   Title  Patient will report that her neck pain has improved by at least 75% to improve ability to perform daily activities.    Time  6    Period  Weeks    Status  On-going      PT LONG TERM GOAL #4   Title  Patient will demonstrate improvement on FOTO of at least 10% indicating improved percieved functional mobility.    Time  6    Period  Weeks    Status  On-going            Plan -  11/23/19 1349    Clinical Impression Statement  Patient with decreased pain this session and improved mobility. Added postural strengthening with theraband. Also, added Y Liftoff this session which patient required minimal cueing to perform. Patient reported feeling good following manual therapy. Patient would benefit from continued skilled physical therapy to continue progressing towards functional goals.    Personal Factors and Comorbidities  Age;Comorbidity 2    Comorbidities  Hyperlipidemia, depression    Examination-Activity Limitations  Lift;Reach Overhead;Dressing;Hygiene/Grooming    Examination-Participation Restrictions  Driving;Community Activity    Stability/Clinical Decision Making  Stable/Uncomplicated    Rehab Potential  Fair    PT Frequency  2x / week    PT Duration  6 weeks    PT Treatment/Interventions  ADLs/Self Care Home Management;Cryotherapy;Electrical Stimulation;Moist Heat;Traction;Functional mobility training;Therapeutic activities;Therapeutic exercise;Neuromuscular re-education;Patient/family education;Manual techniques;Passive range of motion;Dry needling;Energy conservation;Taping    PT Next Visit Plan  Perform STM to decrease pain and promote relaxation. Add theraband exercises to HEP as able    PT Home Exercise Plan  Continue gentle ROM; 11/18/19: Trap stretch, cervical retraction    Consulted and Agree with Plan of Care  Patient       Patient will benefit from skilled therapeutic intervention in order to improve the following deficits and impairments:  Pain, Decreased mobility, Postural dysfunction, Decreased activity tolerance, Decreased endurance, Decreased range of motion, Decreased strength  Visit Diagnosis: Cervicalgia  Stiffness of right shoulder, not elsewhere classified     Problem List Patient Active Problem List   Diagnosis Date Noted  . Low back pain without sciatica 05/11/2019  . Arthritis 08/01/2018  . Knee pain, chronic 08/01/2018  . CVA  (cerebral vascular accident) ([place]) 08/01/2018  . Hyperlipidemia 08/01/2018  . Migraines 08/01/2018  . Neck pain, chronic 01/13/2014  . Baker's cyst of knee 12/09/2013  . Stiffness of joint, not elsewhere  classified, other specified site 03/02/2013  . Weakness of neck 03/02/2013   Clarene Critchley PT, DPT 1:53 PM, 11/23/19 [place], [place], [address] Phone: [phone]   Fax:  [phone]  Name: [name] MRN: [id number] Date of Birth: 1946-04-01

## 2019-11-25 ENCOUNTER — Other Ambulatory Visit: Payer: Self-pay

## 2019-11-25 ENCOUNTER — Ambulatory Visit (HOSPITAL_COMMUNITY): Payer: PPO | Admitting: Physical Therapy

## 2019-11-25 ENCOUNTER — Encounter (HOSPITAL_COMMUNITY): Payer: Self-pay | Admitting: Physical Therapy

## 2019-11-25 DIAGNOSIS — M25611 Stiffness of right shoulder, not elsewhere classified: Secondary | ICD-10-CM

## 2019-11-25 DIAGNOSIS — M542 Cervicalgia: Secondary | ICD-10-CM | POA: Diagnosis not present

## 2019-11-25 NOTE — Therapy (Signed)
[place], [place], [address] Phone: [phone]   Fax:  [phone]  Physical Therapy Treatment  Patient Details  Name: [name] MRN: [id number] Date of Birth: Jun 17, 1946 Referring Provider (PT): Asencion Noble, MD   Encounter Date: 11/25/2019  PT End of Session - 11/25/19 1319    Visit Number  4    Number of Visits  12    Date for PT Re-Evaluation  12/29/19    Authorization Type  [employer] (visits based on medical necessity)    Authorization Time Period  11/17/19 - 12/29/19 Progress note due visit 10    Authorization - Visit Number  4    Authorization - Number of Visits  10    PT Start Time  1302    PT Stop Time  1340    PT Time Calculation (min)  38 min    Activity Tolerance  Patient tolerated treatment well    Behavior During Therapy  [facility] for tasks assessed/performed       Past Medical History:  Diagnosis Date  . Arthritis   . Depression   . H/O head injury   . Hearing loss   . History of stomach ulcers   . Migraine   . Stroke [facility])     Past Surgical History:  Procedure Laterality Date  . ANKLE FRACTURE SURGERY    . APPENDECTOMY    . BREAST LUMPECTOMY    . CESAREAN SECTION    . PARTIAL HYSTERECTOMY    . ROTATOR CUFF REPAIR      There were no vitals filed for this visit.  Subjective Assessment - 11/25/19 1305    Subjective  Patient reported some pain today which she rated as a 2/10. Stated she's having pain in suboccipital muscles.    Limitations  House hold activities;Other (comment)   Driving   Diagnostic tests  MRI from 02/17/14    Patient Stated Goals  To have less pain    Currently in Pain?  Yes    Pain Score  2     Pain Location  Neck    Pain Orientation  Right    Pain Descriptors / Indicators  Aching    Pain Type  Acute pain                       [facility] Adult PT Treatment/Exercise - 11/25/19 0001      Neck Exercises: Theraband   Shoulder Extension   Red;15 reps    Shoulder Extension Limitations  2 sets    Rows  Red;15 reps    Rows Limitations  2 sets      Neck Exercises: Standing   Other Standing Exercises  Y liftoff 15x       Neck Exercises: Seated   Neck Retraction  15 reps;5 secs    W Back  15 reps    Other Seated Exercise  Trapezius stretch 4x15''       Manual Therapy   Manual Therapy  Soft tissue mobilization;Passive ROM    Manual therapy comments  performed separately from all other interventions    Soft tissue mobilization  Suboccipital STM in supine. STM to patient's levator scapulae and trapezius muscles LT>RT to patient's tolerance, trigger point release as needed    Passive ROM  Cervical rotation gently               PT Short Term Goals - 11/18/19 1438  PT SHORT TERM GOAL #1   Title  Patient will report understanding and regular compliance with HEP to improve ROM, decrease pain, and improve overall functional mobility.    Time  3    Period  Weeks    Status  New    Target Date  12/08/19        PT Long Term Goals - 11/18/19 1438      PT LONG TERM GOAL #1   Title  Pt will demonstrate full, pain free cervical AROM to increase her safety and ability to drive in the community.     Time  6    Period  Weeks    Status  On-going      PT LONG TERM GOAL #2   Title  Patient will demonstrate ability to reach behind back to mid-back with decreased pain for ability to don doff clothing with improved ease.    Time  6    Period  Weeks    Status  On-going      PT LONG TERM GOAL #3   Title  Patient will report that her neck pain has improved by at least 75% to improve ability to perform daily activities.    Time  6    Period  Weeks    Status  On-going      PT LONG TERM GOAL #4   Title  Patient will demonstrate improvement on FOTO of at least 10% indicating improved percieved functional mobility.    Time  6    Period  Weeks    Status  On-going            Plan - 11/25/19 1345    Clinical  Impression Statement  Continued with established POC this session. Focused some on suboccipitals with manual therapy this session as patient reported an increase in pain in that area the last couple days. Increased sets with postural strengthening exercises this session. Patient reported improved ability to move neck following manual therapy this session.    Personal Factors and Comorbidities  Age;Comorbidity 2    Comorbidities  Hyperlipidemia, depression    Examination-Activity Limitations  Lift;Reach Overhead;Dressing;Hygiene/Grooming    Examination-Participation Restrictions  Driving;Community Activity    Stability/Clinical Decision Making  Stable/Uncomplicated    Rehab Potential  Fair    PT Frequency  2x / week    PT Duration  6 weeks    PT Treatment/Interventions  ADLs/Self Care Home Management;Cryotherapy;Electrical Stimulation;Moist Heat;Traction;Functional mobility training;Therapeutic activities;Therapeutic exercise;Neuromuscular re-education;Patient/family education;Manual techniques;Passive range of motion;Dry needling;Energy conservation;Taping    PT Next Visit Plan  Perform STM to decrease pain and promote relaxation. Add theraband exercises to HEP as able    PT Home Exercise Plan  Continue gentle ROM; 11/18/19: Trap stretch, cervical retraction    Consulted and Agree with Plan of Care  Patient       Patient will benefit from skilled therapeutic intervention in order to improve the following deficits and impairments:  Pain, Decreased mobility, Postural dysfunction, Decreased activity tolerance, Decreased endurance, Decreased range of motion, Decreased strength  Visit Diagnosis: Cervicalgia  Stiffness of right shoulder, not elsewhere classified     Problem List Patient Active Problem List   Diagnosis Date Noted  . Low back pain without sciatica 05/11/2019  . Arthritis 08/01/2018  . Knee pain, chronic 08/01/2018  . CVA (cerebral vascular accident) ([place]) 08/01/2018  .  Hyperlipidemia 08/01/2018  . Migraines 08/01/2018  . Neck pain, chronic 01/13/2014  . Baker's cyst of knee 12/09/2013  .  Stiffness of joint, not elsewhere classified, other specified site 03/02/2013  . Weakness of neck 03/02/2013   Clarene Critchley PT, DPT 1:46 PM, 11/25/19 [place], [place], [address] Phone: [phone]   Fax:  [phone]  Name: [name] MRN: [id number] Date of Birth: 1945-12-30

## 2019-11-30 ENCOUNTER — Ambulatory Visit (HOSPITAL_COMMUNITY): Payer: PPO | Admitting: Physical Therapy

## 2019-11-30 ENCOUNTER — Other Ambulatory Visit: Payer: Self-pay

## 2019-11-30 ENCOUNTER — Encounter (HOSPITAL_COMMUNITY): Payer: Self-pay | Admitting: Physical Therapy

## 2019-11-30 DIAGNOSIS — M542 Cervicalgia: Secondary | ICD-10-CM

## 2019-11-30 DIAGNOSIS — M25611 Stiffness of right shoulder, not elsewhere classified: Secondary | ICD-10-CM

## 2019-11-30 NOTE — Therapy (Addendum)
[place], [place], [address] Phone: [phone]   Fax:  [phone]  Physical Therapy Treatment  Patient Details  Name: [name] MRN: [id number] Date of Birth: Nov 14, 1945 Referring Provider (PT): Asencion Noble, MD   Encounter Date: 11/30/2019  PT End of Session - 11/30/19 1358    Visit Number  5    Number of Visits  12    Date for PT Re-Evaluation  12/29/19    Authorization Type  [employer] (visits based on medical necessity)    Authorization Time Period  11/17/19 - 12/29/19 Progress note due visit 10    Authorization - Visit Number  5    Authorization - Number of Visits  10    PT Start Time  1351    PT Stop Time  1429    PT Time Calculation (min)  38 min    Activity Tolerance  Patient tolerated treatment well    Behavior During Therapy  [facility] for tasks assessed/performed       Past Medical History:  Diagnosis Date  . Arthritis   . Depression   . H/O head injury   . Hearing loss   . History of stomach ulcers   . Migraine   . Stroke [facility])     Past Surgical History:  Procedure Laterality Date  . ANKLE FRACTURE SURGERY    . APPENDECTOMY    . BREAST LUMPECTOMY    . CESAREAN SECTION    . PARTIAL HYSTERECTOMY    . ROTATOR CUFF REPAIR      There were no vitals filed for this visit.  Subjective Assessment - 11/30/19 1355    Subjective  Patient reported that her neck pain is about a 5/10. She said her right shoulder has been bothering her the past couple days which she said happens sometimes because she had a RTC repair in that shoulder.    Limitations  House hold activities;Other (comment)   Driving   Diagnostic tests  MRI from 02/17/14    Patient Stated Goals  To have less pain    Currently in Pain?  Yes    Pain Score  5     Pain Location  Neck    Pain Orientation  Right    Pain Descriptors / Indicators  Aching    Pain Type  Acute pain                       [facility] Adult  PT Treatment/Exercise - 11/30/19 0001      Neck Exercises: Theraband   Shoulder Extension  Red;15 reps    Shoulder Extension Limitations  2 sets    Rows  Red;15 reps    Rows Limitations  2 sets      Neck Exercises: Standing   Other Standing Exercises  Y liftoff 15x       Neck Exercises: Seated   Neck Retraction  15 reps;5 secs    W Back  15 reps    Other Seated Exercise  Trapezius stretch 4x15''             Manual Therapy  Manual Therapy Soft tissue mobilization;Passive ROM  Manual therapy comments performed separately from all other interventions  Soft tissue mobilization STM to patient's levator scapulae and trapezius muscles LT>RT to patient's tolerance, trigger point release as needed          PT Short Term Goals - 11/18/19 1438  PT SHORT TERM GOAL #1   Title  Patient will report understanding and regular compliance with HEP to improve ROM, decrease pain, and improve overall functional mobility.    Time  3    Period  Weeks    Status  New    Target Date  12/08/19        PT Long Term Goals - 11/18/19 1438      PT LONG TERM GOAL #1   Title  Pt will demonstrate full, pain free cervical AROM to increase her safety and ability to drive in the community.     Time  6    Period  Weeks    Status  On-going      PT LONG TERM GOAL #2   Title  Patient will demonstrate ability to reach behind back to mid-back with decreased pain for ability to don doff clothing with improved ease.    Time  6    Period  Weeks    Status  On-going      PT LONG TERM GOAL #3   Title  Patient will report that her neck pain has improved by at least 75% to improve ability to perform daily activities.    Time  6    Period  Weeks    Status  On-going      PT LONG TERM GOAL #4   Title  Patient will demonstrate improvement on FOTO of at least 10% indicating improved percieved functional mobility.    Time  6    Period  Weeks    Status  On-going            Plan - 11/30/19 1433     Clinical Impression Statement  Continued with established POC this session. Added postural strengthening exercises to patient's HEP this date with education on how to place bands to perform strengthening exercises. Patient required verbal cueing to correctly perform cervical retraction this session to perform a chin tuck rather than flexion. Patient would benefit from continued skilled physical therapy to continue progressing towards functional goals.    Personal Factors and Comorbidities  Age;Comorbidity 2    Comorbidities  Hyperlipidemia, depression    Examination-Activity Limitations  Lift;Reach Overhead;Dressing;Hygiene/Grooming    Examination-Participation Restrictions  Driving;Community Activity    Stability/Clinical Decision Making  Stable/Uncomplicated    Rehab Potential  Fair    PT Frequency  2x / week    PT Duration  6 weeks    PT Treatment/Interventions  ADLs/Self Care Home Management;Cryotherapy;Electrical Stimulation;Moist Heat;Traction;Functional mobility training;Therapeutic activities;Therapeutic exercise;Neuromuscular re-education;Patient/family education;Manual techniques;Passive range of motion;Dry needling;Energy conservation;Taping    PT Next Visit Plan  Perform STM to decrease pain and promote relaxation. Trial prone exercises as able.    PT Home Exercise Plan  Continue gentle ROM; 11/18/19: Trap stretch, cervical retraction; 11/30/19: Row and shoulder extension 2x15 RTB daily    Consulted and Agree with Plan of Care  Patient       Patient will benefit from skilled therapeutic intervention in order to improve the following deficits and impairments:  Pain, Decreased mobility, Postural dysfunction, Decreased activity tolerance, Decreased endurance, Decreased range of motion, Decreased strength  Visit Diagnosis: Cervicalgia  Stiffness of right shoulder, not elsewhere classified     Problem List Patient Active Problem List   Diagnosis Date Noted  . Low back pain without  sciatica 05/11/2019  . Arthritis 08/01/2018  . Knee pain, chronic 08/01/2018  . CVA (cerebral vascular accident) ([place]) 08/01/2018  . Hyperlipidemia 08/01/2018  . Migraines  08/01/2018  . Neck pain, chronic 01/13/2014  . Baker's cyst of knee 12/09/2013  . Stiffness of joint, not elsewhere classified, other specified site 03/02/2013  . Weakness of neck 03/02/2013   Clarene Critchley PT, DPT 2:38 PM, 11/30/19 [place], [place], [address] Phone: [phone]   Fax:  [phone]  Name: [name] MRN: [id number] Date of Birth: September 12, 1946

## 2019-11-30 NOTE — Patient Instructions (Signed)
[place] access code: [email or web address]

## 2019-12-02 ENCOUNTER — Other Ambulatory Visit: Payer: Self-pay

## 2019-12-02 ENCOUNTER — Ambulatory Visit (HOSPITAL_COMMUNITY): Payer: PPO | Admitting: Physical Therapy

## 2019-12-02 ENCOUNTER — Encounter (HOSPITAL_COMMUNITY): Payer: Self-pay | Admitting: Physical Therapy

## 2019-12-02 DIAGNOSIS — M542 Cervicalgia: Secondary | ICD-10-CM | POA: Diagnosis not present

## 2019-12-02 DIAGNOSIS — M25611 Stiffness of right shoulder, not elsewhere classified: Secondary | ICD-10-CM

## 2019-12-02 NOTE — Therapy (Signed)
[place], [place], [address] Phone: [phone]   Fax:  [phone]  Physical Therapy Treatment  Patient Details  Name: [name] MRN: [id number] Date of Birth: 1946/10/26 Referring Provider (PT): Asencion Noble, MD   Encounter Date: 12/02/2019  PT End of Session - 12/02/19 1306    Visit Number  6    Number of Visits  12    Date for PT Re-Evaluation  12/29/19    Authorization Type  [employer] (visits based on medical necessity)    Authorization Time Period  11/17/19 - 12/29/19 Progress note due visit 10    Authorization - Visit Number  6    Authorization - Number of Visits  10    PT Start Time  1302    PT Stop Time  1330   Patient requested to leave early   PT Time Calculation (min)  28 min    Activity Tolerance  Patient tolerated treatment well    Behavior During Therapy  [facility] for tasks assessed/performed       Past Medical History:  Diagnosis Date  . Arthritis   . Depression   . H/O head injury   . Hearing loss   . History of stomach ulcers   . Migraine   . Stroke [facility])     Past Surgical History:  Procedure Laterality Date  . ANKLE FRACTURE SURGERY    . APPENDECTOMY    . BREAST LUMPECTOMY    . CESAREAN SECTION    . PARTIAL HYSTERECTOMY    . ROTATOR CUFF REPAIR      There were no vitals filed for this visit.  Subjective Assessment - 12/02/19 1305    Subjective  Patient reported that she had a lot of pain last night, but currently not having pain, just feels like she has to be on-guard.    Limitations  House hold activities;Other (comment)   Driving   Diagnostic tests  MRI from 02/17/14    Patient Stated Goals  To have less pain    Currently in Pain?  No/denies                       [facility] Adult PT Treatment/Exercise - 12/02/19 1307      Neck Exercises: Seated   Other Seated Exercise  Trapezius stretch 4x15''       Neck Exercises: Prone   Axial Exension  10 reps     Shoulder Extension  15 reps    Shoulder Extension Limitations  2 sets    Rows  15 reps    Rows Limitations  2 sets      Manual Therapy   Manual Therapy  Soft tissue mobilization;Passive ROM    Manual therapy comments  performed separately from all other interventions    Soft tissue mobilization  STM to patient's levator scapulae and trapezius muscles LT>RT to patient's tolerance, trigger point release as needed               PT Short Term Goals - 11/18/19 1438      PT SHORT TERM GOAL #1   Title  Patient will report understanding and regular compliance with HEP to improve ROM, decrease pain, and improve overall functional mobility.    Time  3    Period  Weeks    Status  New    Target Date  12/08/19        PT Long Term Goals -  11/18/19 1438      PT LONG TERM GOAL #1   Title  Pt will demonstrate full, pain free cervical AROM to increase her safety and ability to drive in the community.     Time  6    Period  Weeks    Status  On-going      PT LONG TERM GOAL #2   Title  Patient will demonstrate ability to reach behind back to mid-back with decreased pain for ability to don doff clothing with improved ease.    Time  6    Period  Weeks    Status  On-going      PT LONG TERM GOAL #3   Title  Patient will report that her neck pain has improved by at least 75% to improve ability to perform daily activities.    Time  6    Period  Weeks    Status  On-going      PT LONG TERM GOAL #4   Title  Patient will demonstrate improvement on FOTO of at least 10% indicating improved percieved functional mobility.    Time  6    Period  Weeks    Status  On-going            Plan - 12/02/19 1345    Clinical Impression Statement  Patient requested to end session early this date due to having to be somewhere after session, therefore session was shortened due to this. Patient progressed to prone postural strengthening exercises this session. Patient required significant verbal  cues to perform prone chin tucks initially. Ended session with STM with patient reporting feeling good at end of session.    Personal Factors and Comorbidities  Age;Comorbidity 2    Comorbidities  Hyperlipidemia, depression    Examination-Activity Limitations  Lift;Reach Overhead;Dressing;Hygiene/Grooming    Examination-Participation Restrictions  Driving;Community Activity    Stability/Clinical Decision Making  Stable/Uncomplicated    Rehab Potential  Fair    PT Frequency  2x / week    PT Duration  6 weeks    PT Treatment/Interventions  ADLs/Self Care Home Management;Cryotherapy;Electrical Stimulation;Moist Heat;Traction;Functional mobility training;Therapeutic activities;Therapeutic exercise;Neuromuscular re-education;Patient/family education;Manual techniques;Passive range of motion;Dry needling;Energy conservation;Taping    PT Next Visit Plan  Perform STM to decrease pain and promote relaxation.    PT Home Exercise Plan  Continue gentle ROM; 11/18/19: Trap stretch, cervical retraction; 11/30/19: Row and shoulder extension 2x15 RTB daily    Consulted and Agree with Plan of Care  Patient       Patient will benefit from skilled therapeutic intervention in order to improve the following deficits and impairments:  Pain, Decreased mobility, Postural dysfunction, Decreased activity tolerance, Decreased endurance, Decreased range of motion, Decreased strength  Visit Diagnosis: Cervicalgia  Stiffness of right shoulder, not elsewhere classified     Problem List Patient Active Problem List   Diagnosis Date Noted  . Low back pain without sciatica 05/11/2019  . Arthritis 08/01/2018  . Knee pain, chronic 08/01/2018  . CVA (cerebral vascular accident) ([place]) 08/01/2018  . Hyperlipidemia 08/01/2018  . Migraines 08/01/2018  . Neck pain, chronic 01/13/2014  . Baker's cyst of knee 12/09/2013  . Stiffness of joint, not elsewhere classified, other specified site 03/02/2013  . Weakness of neck  03/02/2013   Clarene Critchley PT, DPT 1:47 PM, 12/02/19 [place], [place], [address] Phone: [phone]   Fax:  [phone]  Name: [name] MRN: [id number] Date of  Birth: [birth year]

## 2019-12-06 ENCOUNTER — Ambulatory Visit: Payer: PPO | Attending: Internal Medicine

## 2019-12-06 DIAGNOSIS — Z23 Encounter for immunization: Secondary | ICD-10-CM | POA: Insufficient documentation

## 2019-12-06 NOTE — Progress Notes (Signed)
   Covid-19 Vaccination Clinic  Name:  [name]    MRN: [id number] DOB: 1946-02-15  12/06/2019  [name] was observed post Covid-19 immunization for 15 minutes without incidence. She was provided with Vaccine Information Sheet and instruction to access the V-Safe system.   [name] was instructed to call 911 with any severe reactions post vaccine: Marland Kitchen Difficulty breathing  . Swelling of your face and throat  . A fast heartbeat  . A bad rash all over your body  . Dizziness and weakness    Immunizations Administered    Name Date Dose VIS Date Route   Pfizer COVID-19 Vaccine 12/06/2019  2:17 PM 0.3 mL 10/22/2019 Intramuscular   Manufacturer: [place]   Lot: [id number]   [place]: [id number]

## 2019-12-07 ENCOUNTER — Encounter (HOSPITAL_COMMUNITY): Payer: PPO | Admitting: Physical Therapy

## 2019-12-09 ENCOUNTER — Encounter (HOSPITAL_COMMUNITY): Payer: Self-pay | Admitting: Physical Therapy

## 2019-12-09 ENCOUNTER — Ambulatory Visit (HOSPITAL_COMMUNITY): Payer: PPO | Admitting: Physical Therapy

## 2019-12-09 ENCOUNTER — Other Ambulatory Visit: Payer: Self-pay

## 2019-12-09 DIAGNOSIS — M542 Cervicalgia: Secondary | ICD-10-CM

## 2019-12-09 DIAGNOSIS — M25611 Stiffness of right shoulder, not elsewhere classified: Secondary | ICD-10-CM

## 2019-12-09 NOTE — Therapy (Signed)
[place], [place], [address] Phone: [phone]   Fax:  [phone]  Physical Therapy Treatment  Patient Details  Name: [name] MRN: [id number] Date of Birth: 1946-02-26 Referring Provider (PT): Asencion Noble, MD   Encounter Date: 12/09/2019  PT End of Session - 12/09/19 1442    Visit Number  7    Number of Visits  12    Date for PT Re-Evaluation  12/29/19    Authorization Type  [employer] (visits based on medical necessity)    Authorization Time Period  11/17/19 - 12/29/19 Progress note due visit 10    Authorization - Visit Number  7    Authorization - Number of Visits  10    PT Start Time  [id number]    PT Stop Time  1515    PT Time Calculation (min)  38 min    Activity Tolerance  Patient tolerated treatment well    Behavior During Therapy  [facility] for tasks assessed/performed       Past Medical History:  Diagnosis Date  . Arthritis   . Depression   . H/O head injury   . Hearing loss   . History of stomach ulcers   . Migraine   . Stroke [facility])     Past Surgical History:  Procedure Laterality Date  . ANKLE FRACTURE SURGERY    . APPENDECTOMY    . BREAST LUMPECTOMY    . CESAREAN SECTION    . PARTIAL HYSTERECTOMY    . ROTATOR CUFF REPAIR      There were no vitals filed for this visit.  Subjective Assessment - 12/09/19 1439    Subjective  Patient reported having gotten the first dose of the Covid vaccine.    Limitations  House hold activities;Other (comment)   Driving   Diagnostic tests  MRI from 02/17/14    Patient Stated Goals  To have less pain    Currently in Pain?  No/denies                       [facility] Adult PT Treatment/Exercise - 12/09/19 0001      Neck Exercises: Theraband   Shoulder Extension  Red;15 reps    Shoulder Extension Limitations  2 sets    Rows  Red;15 reps    Rows Limitations  2 sets      Neck Exercises: Standing   Other Standing Exercises  Shoulder  abduction and shoulder flexion in scapular plane 2# free-weights x15      Neck Exercises: Prone   Axial Exension  10 reps      Manual Therapy   Manual Therapy  Soft tissue mobilization    Manual therapy comments  performed separately from all other interventions    Soft tissue mobilization  STM to patient's levator scapulae and trapezius muscle to patient's tolerance, trigger point release as needed               PT Short Term Goals - 11/18/19 1438      PT SHORT TERM GOAL #1   Title  Patient will report understanding and regular compliance with HEP to improve ROM, decrease pain, and improve overall functional mobility.    Time  3    Period  Weeks    Status  New    Target Date  12/08/19        PT Long Term Goals - 11/18/19 1438  PT LONG TERM GOAL #1   Title  Pt will demonstrate full, pain free cervical AROM to increase her safety and ability to drive in the community.     Time  6    Period  Weeks    Status  On-going      PT LONG TERM GOAL #2   Title  Patient will demonstrate ability to reach behind back to mid-back with decreased pain for ability to don doff clothing with improved ease.    Time  6    Period  Weeks    Status  On-going      PT LONG TERM GOAL #3   Title  Patient will report that her neck pain has improved by at least 75% to improve ability to perform daily activities.    Time  6    Period  Weeks    Status  On-going      PT LONG TERM GOAL #4   Title  Patient will demonstrate improvement on FOTO of at least 10% indicating improved percieved functional mobility.    Time  6    Period  Weeks    Status  On-going            Plan - 12/09/19 1543    Clinical Impression Statement  Progressed patient with scapular strengthening this session including shoulder abduction and shoulder flexion in the scapular plane. Patient reported discomfort in prone position due to discomfort of laying on chest therefore discontinued prone exercises. Patient  reported feeling good at end of session.    Personal Factors and Comorbidities  Age;Comorbidity 2    Comorbidities  Hyperlipidemia, depression    Examination-Activity Limitations  Lift;Reach Overhead;Dressing;Hygiene/Grooming    Examination-Participation Restrictions  Driving;Community Activity    Stability/Clinical Decision Making  Stable/Uncomplicated    Rehab Potential  Fair    PT Frequency  2x / week    PT Duration  6 weeks    PT Treatment/Interventions  ADLs/Self Care Home Management;Cryotherapy;Electrical Stimulation;Moist Heat;Traction;Functional mobility training;Therapeutic activities;Therapeutic exercise;Neuromuscular re-education;Patient/family education;Manual techniques;Passive range of motion;Dry needling;Energy conservation;Taping    PT Next Visit Plan  Progress postural strengthening as able. Consider adding D2 Flexion PNF next session.    PT Home Exercise Plan  Continue gentle ROM; 11/18/19: Trap stretch, cervical retraction; 11/30/19: Row and shoulder extension 2x15 RTB daily    Consulted and Agree with Plan of Care  Patient       Patient will benefit from skilled therapeutic intervention in order to improve the following deficits and impairments:  Pain, Decreased mobility, Postural dysfunction, Decreased activity tolerance, Decreased endurance, Decreased range of motion, Decreased strength  Visit Diagnosis: Cervicalgia  Stiffness of right shoulder, not elsewhere classified     Problem List Patient Active Problem List   Diagnosis Date Noted  . Low back pain without sciatica 05/11/2019  . Arthritis 08/01/2018  . Knee pain, chronic 08/01/2018  . CVA (cerebral vascular accident) ([place]) 08/01/2018  . Hyperlipidemia 08/01/2018  . Migraines 08/01/2018  . Neck pain, chronic 01/13/2014  . Baker's cyst of knee 12/09/2013  . Stiffness of joint, not elsewhere classified, other specified site 03/02/2013  . Weakness of neck 03/02/2013   Clarene Critchley PT, DPT 3:45 PM,  12/09/19 [place], [place], [address] Phone: [phone]   Fax:  [phone]  Name: [name] MRN: [id number] Date of Birth: 1946-07-21

## 2019-12-10 ENCOUNTER — Ambulatory Visit (HOSPITAL_COMMUNITY): Payer: PPO | Admitting: Physical Therapy

## 2019-12-10 ENCOUNTER — Encounter (HOSPITAL_COMMUNITY): Payer: Self-pay | Admitting: Physical Therapy

## 2019-12-10 DIAGNOSIS — M542 Cervicalgia: Secondary | ICD-10-CM | POA: Diagnosis not present

## 2019-12-10 DIAGNOSIS — M25611 Stiffness of right shoulder, not elsewhere classified: Secondary | ICD-10-CM

## 2019-12-10 NOTE — Therapy (Signed)
[place], [place], [address] Phone: [phone]   Fax:  [phone]  Physical Therapy Treatment  Patient Details  Name: [name] MRN: [id number] Date of Birth: 1946/06/11 Referring Provider (PT): Asencion Noble, MD   Encounter Date: 12/10/2019  PT End of Session - 12/10/19 1358    Visit Number  8    Number of Visits  12    Date for PT Re-Evaluation  12/29/19    Authorization Type  [employer] (visits based on medical necessity)    Authorization Time Period  11/17/19 - 12/29/19 Progress note due visit 10    Authorization - Visit Number  8    Authorization - Number of Visits  10    PT Start Time  1351    PT Stop Time  1429    PT Time Calculation (min)  38 min    Activity Tolerance  Patient tolerated treatment well    Behavior During Therapy  [facility] for tasks assessed/performed       Past Medical History:  Diagnosis Date  . Arthritis   . Depression   . H/O head injury   . Hearing loss   . History of stomach ulcers   . Migraine   . Stroke [facility])     Past Surgical History:  Procedure Laterality Date  . ANKLE FRACTURE SURGERY    . APPENDECTOMY    . BREAST LUMPECTOMY    . CESAREAN SECTION    . PARTIAL HYSTERECTOMY    . ROTATOR CUFF REPAIR      There were no vitals filed for this visit.  Subjective Assessment - 12/10/19 1356    Subjective  Patient reported some soreness in the right shoulder, but not especially painful.    Limitations  House hold activities;Other (comment)   Driving   Diagnostic tests  MRI from 02/17/14    Patient Stated Goals  To have less pain    Currently in Pain?  No/denies                       [facility] Adult PT Treatment/Exercise - 12/10/19 0001      Neck Exercises: Theraband   Shoulder Extension  15 reps;Green    Shoulder Extension Limitations  2 sets    Rows  15 reps;Green    Rows Limitations  2 sets    Other Theraband Exercises  PNF D2 Flexion RTB  2x10 each UE      Neck Exercises: Standing   Other Standing Exercises  Shoulder abduction and shoulder flexion in scapular plane 2# free-weights x15    Other Standing Exercises  Snow angels on wall x 15 with cervical chin tuck      Neck Exercises: Seated   Neck Retraction  15 reps;5 secs      Manual Therapy   Manual Therapy  Soft tissue mobilization    Manual therapy comments  performed separately from all other interventions    Soft tissue mobilization  STM to patient's levator scapulae and trapezius muscle to patient's tolerance, trigger point release as needed               PT Short Term Goals - 11/18/19 1438      PT SHORT TERM GOAL #1   Title  Patient will report understanding and regular compliance with HEP to improve ROM, decrease pain, and improve overall functional mobility.    Time  3    Period  Weeks    Status  New    Target Date  12/08/19        PT Long Term Goals - 11/18/19 1438      PT LONG TERM GOAL #1   Title  Pt will demonstrate full, pain free cervical AROM to increase her safety and ability to drive in the community.     Time  6    Period  Weeks    Status  On-going      PT LONG TERM GOAL #2   Title  Patient will demonstrate ability to reach behind back to mid-back with decreased pain for ability to don doff clothing with improved ease.    Time  6    Period  Weeks    Status  On-going      PT LONG TERM GOAL #3   Title  Patient will report that her neck pain has improved by at least 75% to improve ability to perform daily activities.    Time  6    Period  Weeks    Status  On-going      PT LONG TERM GOAL #4   Title  Patient will demonstrate improvement on FOTO of at least 10% indicating improved percieved functional mobility.    Time  6    Period  Weeks    Status  On-going            Plan - 12/10/19 1432    Clinical Impression Statement  Progressed strengthening this session. Increasing theraband resistance to green with some  exercises. In addition, added PNF D2 flexion this session for shoulder and postural strengthening. Continued soft tissue mobilization with continued noted muscular restrictions in the right periscapular muscles which reduced some following manual therapy.    Personal Factors and Comorbidities  Age;Comorbidity 2    Comorbidities  Hyperlipidemia, depression    Examination-Activity Limitations  Lift;Reach Overhead;Dressing;Hygiene/Grooming    Examination-Participation Restrictions  Driving;Community Activity    Stability/Clinical Decision Making  Stable/Uncomplicated    Rehab Potential  Fair    PT Frequency  2x / week    PT Duration  6 weeks    PT Treatment/Interventions  ADLs/Self Care Home Management;Cryotherapy;Electrical Stimulation;Moist Heat;Traction;Functional mobility training;Therapeutic activities;Therapeutic exercise;Neuromuscular re-education;Patient/family education;Manual techniques;Passive range of motion;Dry needling;Energy conservation;Taping    PT Next Visit Plan  Progress postural strengthening as able.    PT Home Exercise Plan  Continue gentle ROM; 11/18/19: Trap stretch, cervical retraction; 11/30/19: Row and shoulder extension 2x15 RTB daily    Consulted and Agree with Plan of Care  Patient       Patient will benefit from skilled therapeutic intervention in order to improve the following deficits and impairments:  Pain, Decreased mobility, Postural dysfunction, Decreased activity tolerance, Decreased endurance, Decreased range of motion, Decreased strength  Visit Diagnosis: Cervicalgia  Stiffness of right shoulder, not elsewhere classified     Problem List Patient Active Problem List   Diagnosis Date Noted  . Low back pain without sciatica 05/11/2019  . Arthritis 08/01/2018  . Knee pain, chronic 08/01/2018  . CVA (cerebral vascular accident) ([place]) 08/01/2018  . Hyperlipidemia 08/01/2018  . Migraines 08/01/2018  . Neck pain, chronic 01/13/2014  . Baker's cyst of  knee 12/09/2013  . Stiffness of joint, not elsewhere classified, other specified site 03/02/2013  . Weakness of neck 03/02/2013   Clarene Critchley PT, DPT 2:34 PM, 12/10/19 [place], [place], [address] Phone: [phone]  Fax:  [phone]  Name: TERIA TOOTHAKER MRN: [id number] Date of Birth: 1946/11/08

## 2019-12-14 ENCOUNTER — Ambulatory Visit (HOSPITAL_COMMUNITY): Payer: PPO | Attending: Internal Medicine | Admitting: Physical Therapy

## 2019-12-14 ENCOUNTER — Other Ambulatory Visit: Payer: Self-pay

## 2019-12-14 ENCOUNTER — Encounter (HOSPITAL_COMMUNITY): Payer: Self-pay | Admitting: Physical Therapy

## 2019-12-14 DIAGNOSIS — M25611 Stiffness of right shoulder, not elsewhere classified: Secondary | ICD-10-CM

## 2019-12-14 DIAGNOSIS — M542 Cervicalgia: Secondary | ICD-10-CM | POA: Diagnosis not present

## 2019-12-14 NOTE — Therapy (Signed)
[place], [place], [address] Phone: [phone]   Fax:  [phone]  Physical Therapy Treatment  Patient Details  Name: [name] MRN: [id number] Date of Birth: September 29, 1946 Referring Provider (PT): Asencion Noble, MD   Encounter Date: 12/14/2019  PT End of Session - 12/14/19 [id number]    Visit Number  9    Number of Visits  12    Date for PT Re-Evaluation  12/29/19    Authorization Type  [employer] (visits based on medical necessity)    Authorization Time Period  11/17/19 - 12/29/19 Progress note due visit 10    Authorization - Visit Number  9    Authorization - Number of Visits  10    PT Start Time  [id number]    PT Stop Time  1342    PT Time Calculation (min)  39 min    Activity Tolerance  Patient tolerated treatment well    Behavior During Therapy  [facility] for tasks assessed/performed       Past Medical History:  Diagnosis Date  . Arthritis   . Depression   . H/O head injury   . Hearing loss   . History of stomach ulcers   . Migraine   . Stroke [facility])     Past Surgical History:  Procedure Laterality Date  . ANKLE FRACTURE SURGERY    . APPENDECTOMY    . BREAST LUMPECTOMY    . CESAREAN SECTION    . PARTIAL HYSTERECTOMY    . ROTATOR CUFF REPAIR      There were no vitals filed for this visit.  Subjective Assessment - 12/14/19 1306    Subjective  Patient reported soreness after massage last session, but feeling good today.    Limitations  House hold activities;Other (comment)   Driving   Diagnostic tests  MRI from 02/17/14    Patient Stated Goals  To have less pain    Currently in Pain?  No/denies                       [facility] Adult PT Treatment/Exercise - 12/14/19 0001      Neck Exercises: Theraband   Shoulder Extension  15 reps;Green    Shoulder Extension Limitations  2 sets    Rows  15 reps;Green    Rows Limitations  2 sets    Other Theraband Exercises  PNF D2 Flexion RTB 2x10  each UE      Neck Exercises: Standing   Other Standing Exercises  Shoulder abduction and shoulder flexion in scapular plane 2# free-weights x15. Education on self-massage using tennis ball x 5 minutes of demonstration and practice.       Neck Exercises: Seated   Neck Retraction  15 reps;5 secs      Manual Therapy   Manual Therapy  Soft tissue mobilization    Manual therapy comments  performed separately from all other interventions    Soft tissue mobilization  STM to patient's levator scapulae and trapezius muscle to patient's tolerance, trigger point release as needed               PT Short Term Goals - 11/18/19 1438      PT SHORT TERM GOAL #1   Title  Patient will report understanding and regular compliance with HEP to improve ROM, decrease pain, and improve overall functional mobility.    Time  3    Period  Weeks  Status  New    Target Date  12/08/19        PT Long Term Goals - 11/18/19 1438      PT LONG TERM GOAL #1   Title  Pt will demonstrate full, pain free cervical AROM to increase her safety and ability to drive in the community.     Time  6    Period  Weeks    Status  On-going      PT LONG TERM GOAL #2   Title  Patient will demonstrate ability to reach behind back to mid-back with decreased pain for ability to don doff clothing with improved ease.    Time  6    Period  Weeks    Status  On-going      PT LONG TERM GOAL #3   Title  Patient will report that her neck pain has improved by at least 75% to improve ability to perform daily activities.    Time  6    Period  Weeks    Status  On-going      PT LONG TERM GOAL #4   Title  Patient will demonstrate improvement on FOTO of at least 10% indicating improved percieved functional mobility.    Time  6    Period  Weeks    Status  On-going            Plan - 12/14/19 1347    Clinical Impression Statement  Educated patient this session on self massage using tennis ball and demonstrated to patient  as well as had patient demonstrate understanding. Patient reported that this felt good and demonstrated good understanding. Ended with soft tissue mobilization this session, with some decrease in noted muscular restrictions. Plan to re-assess patient next session.    Personal Factors and Comorbidities  Age;Comorbidity 2    Comorbidities  Hyperlipidemia, depression    Examination-Activity Limitations  Lift;Reach Overhead;Dressing;Hygiene/Grooming    Examination-Participation Restrictions  Driving;Community Activity    Stability/Clinical Decision Making  Stable/Uncomplicated    Rehab Potential  Fair    PT Frequency  2x / week    PT Duration  6 weeks    PT Treatment/Interventions  ADLs/Self Care Home Management;Cryotherapy;Electrical Stimulation;Moist Heat;Traction;Functional mobility training;Therapeutic activities;Therapeutic exercise;Neuromuscular re-education;Patient/family education;Manual techniques;Passive range of motion;Dry needling;Energy conservation;Taping    PT Next Visit Plan  Re-assess    PT Home Exercise Plan  Continue gentle ROM; 11/18/19: Trap stretch, cervical retraction; 11/30/19: Row and shoulder extension 2x15 RTB daily    Consulted and Agree with Plan of Care  Patient       Patient will benefit from skilled therapeutic intervention in order to improve the following deficits and impairments:  Pain, Decreased mobility, Postural dysfunction, Decreased activity tolerance, Decreased endurance, Decreased range of motion, Decreased strength  Visit Diagnosis: Cervicalgia  Stiffness of right shoulder, not elsewhere classified     Problem List Patient Active Problem List   Diagnosis Date Noted  . Low back pain without sciatica 05/11/2019  . Arthritis 08/01/2018  . Knee pain, chronic 08/01/2018  . CVA (cerebral vascular accident) ([place]) 08/01/2018  . Hyperlipidemia 08/01/2018  . Migraines 08/01/2018  . Neck pain, chronic 01/13/2014  . Baker's cyst of knee 12/09/2013  .  Stiffness of joint, not elsewhere classified, other specified site 03/02/2013  . Weakness of neck 03/02/2013   Clarene Critchley PT, DPT 1:48 PM, 12/14/19 [place], [place], [address] Phone: [phone]   Fax:  [phone]  Name: [name] MRN: [id number] Date of Birth: 1946-11-06

## 2019-12-16 ENCOUNTER — Ambulatory Visit (HOSPITAL_COMMUNITY): Payer: PPO | Admitting: Physical Therapy

## 2019-12-16 ENCOUNTER — Other Ambulatory Visit: Payer: Self-pay

## 2019-12-16 ENCOUNTER — Encounter (HOSPITAL_COMMUNITY): Payer: Self-pay | Admitting: Physical Therapy

## 2019-12-16 DIAGNOSIS — M542 Cervicalgia: Secondary | ICD-10-CM | POA: Diagnosis not present

## 2019-12-16 DIAGNOSIS — M25611 Stiffness of right shoulder, not elsewhere classified: Secondary | ICD-10-CM

## 2019-12-16 NOTE — Therapy (Signed)
Manuel [facility] [address] [place], [place], [address] Phone: [phone]   Fax:  [phone]  Physical Therapy Treatment / Progress note / Discharge Summary  Patient Details  Name: [name] MRN: [id number] Date of Birth: 1946/02/01 Referring Provider (PT): Asencion Noble, MD   Encounter Date: 12/16/2019   Progress Note Reporting Period 11/17/19 to 12/16/19  See note below for Objective Data and Assessment of Progress/Goals.    PHYSICAL THERAPY DISCHARGE SUMMARY  Visits from Start of Care: 10  Current functional level related to goals / functional outcomes: See below   Remaining deficits: See below   Education / Equipment: Continue HEP and follow-up with MD as needed Plan: Patient agrees to discharge.  Patient goals were partially met. Patient is being discharged due to                                                     ?????     Meeting the majority of goals and reporting good functional mobility at this time.      PT End of Session - 12/16/19 1345    Visit Number  10    Number of Visits  12    Date for PT Re-Evaluation  12/29/19    Authorization Type  [employer] (visits based on medical necessity)    Authorization Time Period  11/17/19 - 12/29/19 Progress note due visit 10    Authorization - Visit Number  10    Authorization - Number of Visits  10    PT Start Time  1310    PT Stop Time  1331   Shortened session due to discharge   PT Time Calculation (min)  21 min    Activity Tolerance  Patient tolerated treatment well    Behavior During Therapy  [facility] for tasks assessed/performed       Past Medical History:  Diagnosis Date  . Arthritis   . Depression   . H/O head injury   . Hearing loss   . History of stomach ulcers   . Migraine   . Stroke [facility])     Past Surgical History:  Procedure Laterality Date  . ANKLE FRACTURE SURGERY    . APPENDECTOMY    . BREAST LUMPECTOMY    . CESAREAN SECTION    . PARTIAL  HYSTERECTOMY    . ROTATOR CUFF REPAIR      There were no vitals filed for this visit.  Subjective Assessment - 12/16/19 1335    Subjective  Patient reported feeling okay and making good progress in therapy.    Limitations  House hold activities;Other (comment)   Driving   Diagnostic tests  MRI from 02/17/14    Patient Stated Goals  To have less pain    Currently in Pain?  No/denies         [facility] PT Assessment - 12/16/19 0001      Assessment   Medical Diagnosis  Neck Pain    Referring Provider (PT)  Asencion Noble, MD    Hand Dominance  Right      Prior Function   Level of Independence  Independent      Cognition   Overall Cognitive Status  Within Functional Limits for tasks assessed      Observation/Other Assessments   Focus on Therapeutic Outcomes (FOTO)  37% limited   was 45% limited     Sensation   Light Touch  Appears Intact      Posture/Postural Control   Posture/Postural Control  Postural limitations    Postural Limitations  Forward head;Rounded Shoulders      AROM   Overall AROM Comments  Patient with improved IR of right shoulder to T6    Cervical Flexion  40   was 20   Cervical Extension  20   was 15. No pain   Cervical - Right Side Bend  20   was 15. Not painful   Cervical - Left Side Bend  20   no reported pain   Cervical - Right Rotation  41   No reported pain   Cervical - Left Rotation  50   was 35                          PT Education - 12/16/19 1338    Education Details  Discussed re-assessment findings and plan for discharge.    Person(s) Educated  Patient    Methods  Explanation    Comprehension  Verbalized understanding       PT Short Term Goals - 12/16/19 1311      PT SHORT TERM GOAL #1   Title  Patient will report understanding and regular compliance with HEP to improve ROM, decrease pain, and improve overall functional mobility.    Time  3    Period  Weeks    Status  Achieved    Target Date  12/08/19         PT Long Term Goals - 12/16/19 1311      PT LONG TERM GOAL #1   Title  Pt will demonstrate full, pain free cervical AROM to increase her safety and ability to drive in the community.     Baseline  12/16/19: Some continued limitations in AROM, however not painful and able to drive with improved ease    Time  6    Period  Weeks    Status  Partially Met      PT LONG TERM GOAL #2   Title  Patient will demonstrate ability to reach behind back to mid-back with decreased pain for ability to don doff clothing with improved ease.    Time  6    Period  Weeks    Status  Achieved      PT LONG TERM GOAL #3   Title  Patient will report that her neck pain has improved by at least 75% to improve ability to perform daily activities.    Baseline  12/16/19: Reported at least 75% improvement    Time  6    Period  Weeks    Status  Achieved      PT LONG TERM GOAL #4   Title  Patient will demonstrate improvement on FOTO of at least 10% indicating improved percieved functional mobility.    Baseline  12/16/19: 8% improvement    Time  6    Period  Weeks    Status  Partially Met            Plan - 12/16/19 1401    Clinical Impression Statement  Performed re-assessment of patient's progress towards goals. Patient achieved short term goal. Patient achieved 2 out of 4 long term goals and partially met the other 2 long term goals. Patient nearly met FOTO goal and did demonstrate pain-free cervical mobility but  was still a little limited with AROM. However, patient reported significant improvement in functional mobility. Patient is being discharged from therapy at this time as patient is pleased with current functional status and has met the majority of her rehab goals. Educated patient on following up with MD as necessary as well as continuing HEP.    Personal Factors and Comorbidities  Age;Comorbidity 2    Comorbidities  Hyperlipidemia, depression    Examination-Activity Limitations  Lift;Reach  Overhead;Dressing;Hygiene/Grooming    Examination-Participation Restrictions  Driving;Community Activity    Stability/Clinical Decision Making  Stable/Uncomplicated    Rehab Potential  Fair    PT Frequency  2x / week    PT Duration  6 weeks    PT Treatment/Interventions  ADLs/Self Care Home Management;Cryotherapy;Electrical Stimulation;Moist Heat;Traction;Functional mobility training;Therapeutic activities;Therapeutic exercise;Neuromuscular re-education;Patient/family education;Manual techniques;Passive range of motion;Dry needling;Energy conservation;Taping    PT Next Visit Plan  Discharged    PT Home Exercise Plan  Continue gentle ROM; 11/18/19: Trap stretch, cervical retraction; 11/30/19: Row and shoulder extension 2x15 RTB daily    Consulted and Agree with Plan of Care  Patient       Patient will benefit from skilled therapeutic intervention in order to improve the following deficits and impairments:  Pain, Decreased mobility, Postural dysfunction, Decreased activity tolerance, Decreased endurance, Decreased range of motion, Decreased strength  Visit Diagnosis: Cervicalgia  Stiffness of right shoulder, not elsewhere classified     Problem List Patient Active Problem List   Diagnosis Date Noted  . Low back pain without sciatica 05/11/2019  . Arthritis 08/01/2018  . Knee pain, chronic 08/01/2018  . CVA (cerebral vascular accident) ([place]) 08/01/2018  . Hyperlipidemia 08/01/2018  . Migraines 08/01/2018  . Neck pain, chronic 01/13/2014  . Baker's cyst of knee 12/09/2013  . Stiffness of joint, not elsewhere classified, other specified site 03/02/2013  . Weakness of neck 03/02/2013   Clarene Critchley PT, DPT 2:05 PM, 12/16/19 [place] [address] [place], [place], [address] Phone: [phone]   Fax:  [phone]  Name: [name] MRN: [id number] Date of Birth: 1946/01/29

## 2019-12-21 ENCOUNTER — Ambulatory Visit (HOSPITAL_COMMUNITY): Payer: PPO | Admitting: Physical Therapy

## 2019-12-23 ENCOUNTER — Ambulatory Visit (HOSPITAL_COMMUNITY): Payer: PPO | Admitting: Physical Therapy

## 2019-12-27 ENCOUNTER — Ambulatory Visit: Payer: PPO | Attending: Internal Medicine

## 2019-12-27 DIAGNOSIS — Z23 Encounter for immunization: Secondary | ICD-10-CM | POA: Insufficient documentation

## 2019-12-27 NOTE — Progress Notes (Signed)
   Covid-19 Vaccination Clinic  Name:  LEIDI ZARRA    MRN: [id number] DOB: 1946-07-29  12/27/2019  Ms. Morefield was observed post Covid-19 immunization for 15 minutes without incidence. She was provided with Vaccine Information Sheet and instruction to access the V-Safe system.   Ms. Umeda was instructed to call 911 with any severe reactions post vaccine: Marland Kitchen Difficulty breathing  . Swelling of your face and throat  . A fast heartbeat  . A bad rash all over your body  . Dizziness and weakness    Immunizations Administered    Name Date Dose VIS Date Route   Pfizer COVID-19 Vaccine 12/27/2019 12:42 PM 0.3 mL 10/22/2019 Intramuscular   Manufacturer: Luna   Lot: [id number]   [place]: [id number]

## 2020-01-14 ENCOUNTER — Telehealth: Payer: Self-pay | Admitting: Psychiatry

## 2020-01-14 NOTE — Telephone Encounter (Signed)
Unclear what this message is in regards to, there has not been a called made to her. Will call later to follow up

## 2020-01-14 NOTE — Telephone Encounter (Signed)
Pt called and is returning call she received from nurse. Please call pt back on # [phone].

## 2020-01-14 NOTE — Telephone Encounter (Signed)
RTC to patient, she said she had a message to contact the nurse at [facility]. I do not see any phone messages, nor did I try to contact her. She did get her medication delivered this week for Modafinil and Aripiprazole so not needing anything from the office.

## 2020-02-02 ENCOUNTER — Encounter: Payer: Self-pay | Admitting: Psychiatry

## 2020-02-02 ENCOUNTER — Ambulatory Visit (INDEPENDENT_AMBULATORY_CARE_PROVIDER_SITE_OTHER): Payer: PPO | Admitting: Psychiatry

## 2020-02-02 DIAGNOSIS — F331 Major depressive disorder, recurrent, moderate: Secondary | ICD-10-CM | POA: Diagnosis not present

## 2020-02-02 DIAGNOSIS — F5101 Primary insomnia: Secondary | ICD-10-CM | POA: Diagnosis not present

## 2020-02-02 MED ORDER — DULOXETINE HCL 60 MG PO CPEP: 120.0000 mg | ORAL_CAPSULE | Freq: Every day | ORAL | 1 refills | Status: DC

## 2020-02-02 MED ORDER — ESZOPICLONE 2 MG PO TABS: 2.0000 mg | ORAL_TABLET | Freq: Every evening | ORAL | 3 refills | Status: DC | PRN
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
## 2020-02-22 DIAGNOSIS — Z1231 Encounter for screening mammogram for malignant neoplasm of breast: Secondary | ICD-10-CM | POA: Diagnosis not present

## 2020-03-24 ENCOUNTER — Telehealth: Payer: Self-pay | Admitting: Psychiatry

## 2020-03-24 DIAGNOSIS — F40243 Fear of flying: Secondary | ICD-10-CM

## 2020-03-24 NOTE — Telephone Encounter (Signed)
Pt leaves in the next two weeks to fly out to [place] to visit her son and she is really anxious and wants to know can she get anything that may possibly calm her during the flight.

## 2020-03-27 NOTE — Telephone Encounter (Signed)
LM with information and to call back with an update

## 2020-03-31 MED ORDER — ALPRAZOLAM 0.5 MG PO TABS: ORAL_TABLET | ORAL | 0 refills | Status: DC

## 2020-03-31 NOTE — Addendum Note (Signed)
Addended by: Sharyl Nimrod on: 03/31/2020 12:34 PM   Modules accepted: Orders

## 2020-05-19 ENCOUNTER — Other Ambulatory Visit: Payer: Self-pay

## 2020-05-19 DIAGNOSIS — F331 Major depressive disorder, recurrent, moderate: Secondary | ICD-10-CM

## 2020-05-19 DIAGNOSIS — F482 Pseudobulbar affect: Secondary | ICD-10-CM

## 2020-05-19 MED ORDER — MODAFINIL 200 MG PO TABS: ORAL_TABLET | ORAL | 4 refills | Status: DC

## 2020-05-23 ENCOUNTER — Encounter: Payer: Self-pay | Admitting: Psychiatry

## 2020-05-23 ENCOUNTER — Other Ambulatory Visit: Payer: Self-pay

## 2020-05-23 ENCOUNTER — Ambulatory Visit (INDEPENDENT_AMBULATORY_CARE_PROVIDER_SITE_OTHER): Payer: PPO | Admitting: Psychiatry

## 2020-05-23 DIAGNOSIS — F5101 Primary insomnia: Secondary | ICD-10-CM

## 2020-05-23 DIAGNOSIS — F331 Major depressive disorder, recurrent, moderate: Secondary | ICD-10-CM | POA: Diagnosis not present

## 2020-05-23 DIAGNOSIS — F3341 Major depressive disorder, recurrent, in partial remission: Secondary | ICD-10-CM

## 2020-05-23 MED ORDER — ESZOPICLONE 2 MG PO TABS: 2.0000 mg | ORAL_TABLET | Freq: Every evening | ORAL | 3 refills | Status: DC | PRN

## 2020-05-23 MED ORDER — DULOXETINE HCL 60 MG PO CPEP: 120.0000 mg | ORAL_CAPSULE | Freq: Every day | ORAL | 1 refills | Status: DC

## 2020-05-23 MED ORDER — ARIPIPRAZOLE 5 MG PO TABS: 5.0000 mg | ORAL_TABLET | Freq: Every day | ORAL | 1 refills | Status: DC

## 2020-05-23 NOTE — Progress Notes (Signed)
[name] [id number] 14-Jun-1946 74 y.o.  Subjective:   Patient ID:  [name] is a 74 y.o. (DOB 26-Jul-1946) female.  Chief Complaint:  Chief Complaint  Patient presents with  . Follow-up    h/o anxiety, depression, and insomnia    HPI [name] presents to the office today for follow-up of anxiety, depression, and insomnia. Went to visit her son in [place] for 10 days during June. She reports that she left Abilify at home and "got into a really bad funk" and initially did not recognize what had changed. She reports that she was off Abilify for about a week to 10 days. When she resumed Abilify she reports that her mood improved. She reports that she was withdrawn and did not want to talk to others. Enjoyed visit. She reports that her mood has been stable otherwise. Denies any other depression.   She reports that she had some anxiety with flying to see her son. She reports that she took Xanax 1/2 tab on the way to the airport and this was effective. She took Xanax prn on the return flight as well. She reports that flying is a major trigger for her anxiety. She also has anxiety when going over bridges and with heights.   Sleep is interrupted due to frequent urination. She reports that she awakens 2-3 times a night to use the bathroom. She stops drinking liquids after supper. Appetite has been good. She reports that her motivation and energy are somewhat low and has difficulty getting going on some projects. Has been reading some and doing things around the house. Concentration has been adequate. Denies SI.  Has upcoming class reunion and husband is president of the class. She has been helping him prepare for the reunion.  Past medication trials: Celexa Pristiq Wellbutrin-hallucinations Effexor Prozac Zoloft Lexapro Nefazodone-effective and well-tolerated Cymbalta Abilify Nuedexta Trazodone Gabapentin- Disrupted sleep schedules,  nightmares. Vistaril Nuvigil Provigil Memantine Lunesta- Effective for insomnia   AIMS     Office Visit from 05/23/2020 in [place] Visit from 05/26/2019 in [place] Total Score 0 1    PHQ2-9     Office Visit from 05/11/2019 in [place] [facility]/2015 [facility]  PHQ-2 Total Score 1 0 0       Review of Systems:  Review of Systems  Cardiovascular: Positive for leg swelling.  Musculoskeletal: Negative for gait problem.  Neurological: Negative for tremors.  Psychiatric/Behavioral:       Please refer to HPI    Medications: I have reviewed the patient's current medications.  Current Outpatient Medications  Medication Sig Dispense Refill  . ALPRAZolam (XANAX) 0.5 MG tablet Take 1/2-2 tabs po qd prn severe anxiety 10 tablet 0  . DULoxetine (CYMBALTA) 60 MG capsule Take 2 capsules (120 mg total) by mouth at bedtime. 180 capsule 1  . [START ON 06/20/2020] eszopiclone (LUNESTA) 2 MG TABS tablet Take 1 tablet (2 mg total) by mouth at bedtime as needed for sleep. Take immediately before bedtime 30 tablet 3  . modafinil (PROVIGIL) 200 MG tablet Take 1 tab po q am and 1/2 tab po qd prn 45 tablet 4  . oxybutynin (DITROPAN-XL) 10 MG 24 hr tablet Take 10 mg by mouth at bedtime.    . vitamin E 100 UNIT capsule Take by mouth daily.    . ARIPiprazole (ABILIFY) 5 MG tablet Take  1 tablet (5 mg total) by mouth daily. 90 tablet 1  . SUMAtriptan (IMITREX) 100 MG tablet Take 100 mg by mouth every 2 (two) hours as needed for migraine or headache. May repeat in 2 hours if headache persists or recurs.     No current facility-administered medications for this visit.    Medication Side Effects: None  Allergies:  Allergies  Allergen Reactions  . Ticlid [Ticlopidine] Shortness Of Breath  . Anti-Inflammatory Enzyme  [Nutritional Supplements]   . Nsaids   . Prednisone Other (See Comments)    Makes her stomach hurt--knows this isn't an allergy but doesn't want to take it Other reaction(s): Other (See Comments) Makes her stomach hurt--knows this isn't an allergy but doesn't want to take it   . Monistat [Miconazole] Swelling and Rash  . Sulfa Antibiotics Rash    Past Medical History:  Diagnosis Date  . Arthritis   . Depression   . H/O head injury   . Hearing loss   . History of stomach ulcers   . Migraine   . Stroke [facility])     Family History  Problem Relation Age of Onset  . Cancer Father   . Heart disease Brother   . Alcohol abuse Brother   . Depression Maternal Grandmother   . Depression Grandchild   . Breast cancer Daughter     Social History   Socioeconomic History  . Marital status: Married    Spouse name: Not on file  . Number of children: Not on file  . Years of education: Not on file  . Highest education level: Not on file  Occupational History  . Occupation: retired  Tobacco Use  . Smoking status: Never Smoker  . Smokeless tobacco: Never Used  Vaping Use  . Vaping Use: Never used  Substance and Sexual Activity  . Alcohol use: Yes    Comment: occ beer  . Drug use: No  . Sexual activity: Not Currently    Birth control/protection: Surgical    Comment: hyst  Other Topics Concern  . Not on file  Social History Narrative  . Not on file   Social Determinants of Health   Financial Resource Strain:   . Difficulty of Paying Living Expenses:   Food Insecurity:   . Worried About Charity fundraiser in the Last Year:   . Arboriculturist in the Last Year:   Transportation Needs:   . Film/video editor (Medical):   Marland Kitchen Lack of Transportation (Non-Medical):   Physical Activity:   . Days of Exercise per Week:   . Minutes of Exercise per Session:   Stress:   . Feeling of Stress :   Social Connections:   . Frequency of Communication with Friends and Family:   .  Frequency of Social Gatherings with Friends and Family:   . Attends Religious Services:   . Active Member of Clubs or Organizations:   . Attends Archivist Meetings:   Marland Kitchen Marital Status:   Intimate Partner Violence:   . Fear of Current or Ex-Partner:   . Emotionally Abused:   Marland Kitchen Physically Abused:   . Sexually Abused:     Past Medical History, Surgical history, Social history, and Family history were reviewed and updated as appropriate.   Please see review of systems for further details on the patient's review from today.   Objective:   Physical Exam:  BP 110/69   Pulse 81   Physical Exam Constitutional:  General: She is not in acute distress. Musculoskeletal:        General: No deformity.  Neurological:     Mental Status: She is alert and oriented to person, place, and time.     Coordination: Coordination normal.  Psychiatric:        Attention and Perception: Attention and perception normal. She does not perceive auditory or visual hallucinations.        Mood and Affect: Mood normal. Mood is not anxious or depressed. Affect is not labile, blunt, angry or inappropriate.        Speech: Speech normal.        Behavior: Behavior normal.        Thought Content: Thought content normal. Thought content is not paranoid or delusional. Thought content does not include homicidal or suicidal ideation. Thought content does not include homicidal or suicidal plan.        Cognition and Memory: Cognition and memory normal.        Judgment: Judgment normal.     Comments: Insight intact     Lab Review:     Component Value Date/Time   NA 140 05/27/2017 1553   K 5.0 05/27/2017 1553   CL 97 05/27/2017 1553   CO2 27 05/27/2017 1553   GLUCOSE 81 05/27/2017 1553   GLUCOSE 109 (H) 12/20/2015 1915   BUN 15 05/27/2017 1553   CREATININE 0.66 05/27/2017 1553   CALCIUM 10.2 05/27/2017 1553   PROT 7.4 05/27/2017 1553   ALBUMIN 4.5 05/27/2017 1553   AST 26 05/27/2017 1553   ALT 20  05/27/2017 1553   ALKPHOS 69 05/27/2017 1553   BILITOT 0.4 05/27/2017 1553   GFRNONAA 90 05/27/2017 1553   GFRAA 104 05/27/2017 1553       Component Value Date/Time   WBC 14.1 (H) 12/20/2015 1905   RBC 4.24 12/20/2015 1905   HGB 14.6 12/20/2015 1915   HCT 43.0 12/20/2015 1915   PLT 253 12/20/2015 1905   MCV 96.2 12/20/2015 1905   MCH 30.7 12/20/2015 1905   MCHC 31.9 12/20/2015 1905   RDW 13.4 12/20/2015 1905    No results found for: POCLITH, LITHIUM   No results found for: PHENYTOIN, PHENOBARB, VALPROATE, CBMZ   .res Assessment: Plan:   Will continue current plan of care since target signs and symptoms are well controlled without any tolerability issues. Continue Cymbalta 120 mg po qd for depression and anxiety.  Continue Lunesta 2 mg po QHS for insomnia.  Continue Abilify 5 mg po qd for depression. Continue Modafanil 200 mg po q am and 100 mg po qd for excessive somnolence and mood.  Pt to f/u in 4 months or sooner if clinically indicated.  Patient advised to contact office with any questions, adverse effects, or acute worsening in signs and symptoms.  Arnecia was seen today for follow-up.  Diagnoses and all orders for this visit:  Recurrent major depressive disorder, in partial remission (HCC) -     ARIPiprazole (ABILIFY) 5 MG tablet; Take 1 tablet (5 mg total) by mouth daily.  Moderate episode of recurrent major depressive disorder (HCC) -     DULoxetine (CYMBALTA) 60 MG capsule; Take 2 capsules (120 mg total) by mouth at bedtime.  Primary insomnia -     eszopiclone (LUNESTA) 2 MG TABS tablet; Take 1 tablet (2 mg total) by mouth at bedtime as needed for sleep. Take immediately before bedtime     Please see After Visit Summary for patient specific instructions.  No  future appointments.  No orders of the defined types were placed in this encounter.   -------------------------------

## 2020-07-25 ENCOUNTER — Other Ambulatory Visit: Payer: Self-pay

## 2020-07-25 ENCOUNTER — Other Ambulatory Visit: Payer: PPO

## 2020-07-25 DIAGNOSIS — Z20822 Contact with and (suspected) exposure to covid-19: Secondary | ICD-10-CM

## 2020-07-25 DIAGNOSIS — J019 Acute sinusitis, unspecified: Secondary | ICD-10-CM | POA: Diagnosis not present

## 2020-07-27 LAB — SARS-COV-2, NAA 2 DAY TAT

## 2020-07-27 LAB — NOVEL CORONAVIRUS, NAA: SARS-CoV-2, NAA: NOT DETECTED

## 2020-07-27 LAB — SPECIMEN STATUS REPORT

## 2020-08-17 ENCOUNTER — Ambulatory Visit: Payer: PPO | Attending: Internal Medicine

## 2020-08-17 DIAGNOSIS — Z23 Encounter for immunization: Secondary | ICD-10-CM

## 2020-08-17 NOTE — Progress Notes (Signed)
   Covid-19 Vaccination Clinic  Name:  ITZAMAR TRAYNOR    MRN: [id number] DOB: 12-29-1945  08/17/2020  Ms. Kushner was observed post Covid-19 immunization for 15 minutes without incident. She was provided with Vaccine Information Sheet and instruction to access the V-Safe system.   Ms. Steinhaus was instructed to call 911 with any severe reactions post vaccine: Marland Kitchen Difficulty breathing  . Swelling of face and throat  . A fast heartbeat  . A bad rash all over body  . Dizziness and weakness

## 2020-09-22 ENCOUNTER — Other Ambulatory Visit: Payer: Self-pay | Admitting: Psychiatry

## 2020-09-22 DIAGNOSIS — F5101 Primary insomnia: Secondary | ICD-10-CM

## 2020-09-22 NOTE — Telephone Encounter (Signed)
Patient called to get a refill on her Lunesta. Call to [facility] in [place], [place]. [phone]. Next appt is 10/10/20.

## 2020-09-22 NOTE — Telephone Encounter (Signed)
Pt called and LM on VM stating she does not enough meds to last the weekend of Lunesta. Requesting refill to [facility] in [place]

## 2020-09-25 NOTE — Telephone Encounter (Signed)
Next apt 11/30

## 2020-10-10 ENCOUNTER — Ambulatory Visit (INDEPENDENT_AMBULATORY_CARE_PROVIDER_SITE_OTHER): Payer: PPO | Admitting: Psychiatry

## 2020-10-10 ENCOUNTER — Encounter: Payer: Self-pay | Admitting: Psychiatry

## 2020-10-10 ENCOUNTER — Other Ambulatory Visit: Payer: Self-pay

## 2020-10-10 DIAGNOSIS — F3341 Major depressive disorder, recurrent, in partial remission: Secondary | ICD-10-CM | POA: Diagnosis not present

## 2020-10-10 DIAGNOSIS — F482 Pseudobulbar affect: Secondary | ICD-10-CM

## 2020-10-10 DIAGNOSIS — F5101 Primary insomnia: Secondary | ICD-10-CM | POA: Diagnosis not present

## 2020-10-10 DIAGNOSIS — F331 Major depressive disorder, recurrent, moderate: Secondary | ICD-10-CM

## 2020-10-10 MED ORDER — MODAFINIL 200 MG PO TABS: ORAL_TABLET | ORAL | 5 refills | Status: DC

## 2020-10-10 MED ORDER — ARIPIPRAZOLE 5 MG PO TABS: 5.0000 mg | ORAL_TABLET | Freq: Every day | ORAL | 1 refills | Status: DC

## 2020-10-10 MED ORDER — ESZOPICLONE 2 MG PO TABS: ORAL_TABLET | ORAL | 5 refills | Status: DC

## 2020-10-10 MED ORDER — DULOXETINE HCL 60 MG PO CPEP: 120.0000 mg | ORAL_CAPSULE | Freq: Every day | ORAL | 1 refills | Status: DC

## 2020-10-10 NOTE — Progress Notes (Signed)
   10/10/20 1215  Facial and Oral Movements  Muscles of Facial Expression 0  Lips and Perioral Area 0  Jaw 0  Tongue 0  Extremity Movements  Upper (arms, wrists, hands, fingers) 0  Lower (legs, knees, ankles, toes) 0  Trunk Movements  Neck, shoulders, hips 0  Overall Severity  Severity of abnormal movements (highest score from questions above) 0  Incapacitation due to abnormal movements 0  Patient's awareness of abnormal movements (rate only patient's report) 0  AIMS Total Score  AIMS Total Score 0
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
## 2020-10-25 DIAGNOSIS — M9903 Segmental and somatic dysfunction of lumbar region: Secondary | ICD-10-CM | POA: Diagnosis not present

## 2020-10-25 DIAGNOSIS — M62830 Muscle spasm of back: Secondary | ICD-10-CM | POA: Diagnosis not present

## 2020-10-25 DIAGNOSIS — M9905 Segmental and somatic dysfunction of pelvic region: Secondary | ICD-10-CM | POA: Diagnosis not present

## 2020-10-25 DIAGNOSIS — M9902 Segmental and somatic dysfunction of thoracic region: Secondary | ICD-10-CM | POA: Diagnosis not present

## 2020-11-20 DIAGNOSIS — M62830 Muscle spasm of back: Secondary | ICD-10-CM | POA: Diagnosis not present

## 2020-11-20 DIAGNOSIS — M9902 Segmental and somatic dysfunction of thoracic region: Secondary | ICD-10-CM | POA: Diagnosis not present

## 2020-11-20 DIAGNOSIS — M9905 Segmental and somatic dysfunction of pelvic region: Secondary | ICD-10-CM | POA: Diagnosis not present

## 2020-11-20 DIAGNOSIS — M9903 Segmental and somatic dysfunction of lumbar region: Secondary | ICD-10-CM | POA: Diagnosis not present

## 2020-11-29 DIAGNOSIS — K640 First degree hemorrhoids: Secondary | ICD-10-CM | POA: Diagnosis not present

## 2020-11-29 DIAGNOSIS — K649 Unspecified hemorrhoids: Secondary | ICD-10-CM | POA: Diagnosis not present

## 2020-11-29 DIAGNOSIS — K5730 Diverticulosis of large intestine without perforation or abscess without bleeding: Secondary | ICD-10-CM | POA: Diagnosis not present

## 2020-11-29 DIAGNOSIS — Z86010 Personal history of colonic polyps: Secondary | ICD-10-CM | POA: Diagnosis not present

## 2020-11-29 DIAGNOSIS — Z1211 Encounter for screening for malignant neoplasm of colon: Secondary | ICD-10-CM | POA: Diagnosis not present

## 2021-01-16 DIAGNOSIS — R2242 Localized swelling, mass and lump, left lower limb: Secondary | ICD-10-CM | POA: Diagnosis not present

## 2021-01-30 ENCOUNTER — Other Ambulatory Visit: Payer: Self-pay

## 2021-01-30 ENCOUNTER — Encounter: Payer: Self-pay | Admitting: Adult Health

## 2021-01-30 ENCOUNTER — Ambulatory Visit (INDEPENDENT_AMBULATORY_CARE_PROVIDER_SITE_OTHER): Payer: PPO | Admitting: Adult Health

## 2021-01-30 VITALS — BP 130/74 | HR 92 | Ht 65.0 in | Wt 204.6 lb

## 2021-01-30 DIAGNOSIS — N393 Stress incontinence (female) (male): Secondary | ICD-10-CM

## 2021-01-30 DIAGNOSIS — K5909 Other constipation: Secondary | ICD-10-CM

## 2021-01-30 DIAGNOSIS — Z90710 Acquired absence of both cervix and uterus: Secondary | ICD-10-CM | POA: Diagnosis not present

## 2021-01-30 DIAGNOSIS — Z01419 Encounter for gynecological examination (general) (routine) without abnormal findings: Secondary | ICD-10-CM

## 2021-01-30 NOTE — Progress Notes (Signed)
Patient ID: [name], female   DOB: 05/06/1946, 75 y.o.   MRN: [id number] History of Present Illness: [name] is a 75 year old white female,married, sp hysterectomy in for pelvic exam.She was seen by Dr Shelly Flatten in [facility] and wanted to be closer to home. PCP is [facility] Orthopt[facility].   Current Medications, Allergies, Past Medical History, Past Surgical History, Family History and Social History were reviewed in [employer] record.     Review of Systems: Patient denies any headaches, hearing loss, fatigue, blurred vision, shortness of breath, chest pain, abdominal pain, problems with intercourse(not often). No joint pain or mood swings. Has stress UI Has chronic constipation    Physical Exam:BP 130/74 (BP Location: Right Arm, Patient Position: Sitting, Cuff Size: Normal)   Pulse 92   Ht 5\' 5"  (1.651 m)   Wt 204 lb 9.6 oz (92.8 kg)   BMI 34.05 kg/m  General:  Well developed, well nourished, no acute distress Skin:  Warm and dry Neck:  Midline trachea, normal thyroid, good ROM, no lymphadenopathy Lungs; Clear to auscultation bilaterally Cardiovascular: Regular rate and rhythm Pelvic:  External genitalia is normal in appearance, no lesions.  The vagina is pale with loss of moisture and rugae, vaginal cuff looks good. Urethra has no lesions or masses. The cervix and uterus aer absent.No adnexal masses or tenderness noted.Bladder is non tender, no masses felt. Rectal: Deferred.  Psych:  No mood changes, alert and cooperative,seems happy AA is 2 Fall risk is low PHQ 9 scorr is 6,is on meds GAD 7 score is 1  [facility] - 01/30/21 1155      Pregnancy Intention Screening   Does the patient want to become pregnant in the next year? No    Does the patient's partner want to become pregnant in the next year? No    Would the patient like to discuss contraceptive options today? No      Contraception Wrap Up   Current Method Female Sterilization   hyst   End Method Female  Sterilization   hyst   Contraception Counseling Provided No         Examination chaperoned by Engineer, materials and co exam with Tinnie Gens NP student  Impression and Plan: 1. Visit for pelvic exam Pelvic in 2 years if desired or can stop, unless has a problem Get mammograms, last one at [facility] in [facility] Has labs with PCP Colonoscopy due Had DEXA in [place] records   2. S/P hysterectomy  3. SUI (stress urinary incontinence, female) Has ditropan from PCP  4. Chronic constipation Eats fiber, and may use laxative prn

## 2021-02-01 ENCOUNTER — Other Ambulatory Visit: Payer: Self-pay | Admitting: *Deleted

## 2021-02-01 DIAGNOSIS — M7989 Other specified soft tissue disorders: Secondary | ICD-10-CM

## 2021-02-05 ENCOUNTER — Ambulatory Visit: Payer: PPO | Admitting: Physician Assistant

## 2021-02-05 ENCOUNTER — Other Ambulatory Visit: Payer: Self-pay

## 2021-02-05 ENCOUNTER — Ambulatory Visit (HOSPITAL_COMMUNITY)
Admission: RE | Admit: 2021-02-05 | Discharge: 2021-02-05 | Disposition: A | Discharge status: Home / Self Care | Payer: PPO | Source: Ambulatory Visit | Attending: Surgery | Admitting: Surgery

## 2021-02-05 VITALS — BP 120/81 | HR 75 | Temp 98.0°F | Resp 20 | Ht 65.0 in | Wt 206.6 lb

## 2021-02-05 DIAGNOSIS — M7989 Other specified soft tissue disorders: Secondary | ICD-10-CM | POA: Insufficient documentation

## 2021-02-05 DIAGNOSIS — I82812 Embolism and thrombosis of superficial veins of left lower extremities: Secondary | ICD-10-CM

## 2021-02-05 DIAGNOSIS — I872 Venous insufficiency (chronic) (peripheral): Secondary | ICD-10-CM | POA: Diagnosis not present

## 2021-02-05 NOTE — Progress Notes (Signed)
Office Note     CC:  follow up Requesting Provider:  Asencion Noble, MD  HPI: [name] is a 75 y.o. (1946-04-04) female who presents for evaluation of left calf pain and left ankle edema.  She started noticing her calf pain about 2 months ago and it has been on and off since that time.  Over the past 2 weeks she has also noticed edema involving her left ankle.  She has fractured her left ankle in the past.  Patient also states she is injured her ankle numerous times since then.  She has not worn any compression or try to elevate her leg during the day.  She also denies any history of DVT, venous ulcerations, or prior vascular intervention of left lower extremity.  10 to 15 years ago she was told she had stomach ulcers and could not take aspirin.  She denies claudication, rest pain, nonhealing wounds of bilateral lower extremities.  She denies tobacco use.   Past Medical History:  Diagnosis Date  . Arthritis   . Depression   . H/O head injury   . Hearing loss   . History of stomach ulcers   . Migraine   . Stroke [facility])     Past Surgical History:  Procedure Laterality Date  . ANKLE FRACTURE SURGERY    . APPENDECTOMY    . BREAST LUMPECTOMY    . CESAREAN SECTION    . PARTIAL HYSTERECTOMY    . ROTATOR CUFF REPAIR      Social History   Socioeconomic History  . Marital status: Married    Spouse name: Not on file  . Number of children: 3  . Years of education: Not on file  . Highest education level: Not on file  Occupational History  . Occupation: retired  Tobacco Use  . Smoking status: Never Smoker  . Smokeless tobacco: Never Used  Vaping Use  . Vaping Use: Never used  Substance and Sexual Activity  . Alcohol use: Yes    Comment: occ beer  . Drug use: No  . Sexual activity: Yes    Birth control/protection: Surgical    Comment: hyst  Other Topics Concern  . Not on file  Social History Narrative  . Not on file   Social Determinants of Health   Financial Resource  Strain: Medium Risk  . Difficulty of Paying Living Expenses: Somewhat hard  Food Insecurity: No Food Insecurity  . Worried About Charity fundraiser in the Last Year: Never true  . Ran Out of Food in the Last Year: Never true  Transportation Needs: No Transportation Needs  . Lack of Transportation (Medical): No  . Lack of Transportation (Non-Medical): No  Physical Activity: Insufficiently Active  . Days of Exercise per Week: 2 days  . Minutes of Exercise per Session: 10 min  Stress: No Stress Concern Present  . Feeling of Stress : Only a little  Social Connections: Moderately Integrated  . Frequency of Communication with Friends and Family: Twice a week  . Frequency of Social Gatherings with Friends and Family: Twice a week  . Attends Religious Services: More than 4 times per year  . Active Member of Clubs or Organizations: No  . Attends Archivist Meetings: Never  . Marital Status: Married  Human resources officer Violence: Not At Risk  . Fear of Current or Ex-Partner: No  . Emotionally Abused: No  . Physically Abused: No  . Sexually Abused: No    Family History  Problem  Relation Age of Onset  . Cancer Father   . Heart disease Brother   . Alcohol abuse Brother   . Depression Maternal Grandmother   . Depression Grandchild   . Breast cancer Daughter     Current Outpatient Medications  Medication Sig Dispense Refill  . ALPRAZolam (XANAX) 0.5 MG tablet Take 1/2-2 tabs po qd prn severe anxiety 10 tablet 0  . ARIPiprazole (ABILIFY) 5 MG tablet Take 1 tablet (5 mg total) by mouth daily. 90 tablet 1  . DULoxetine (CYMBALTA) 60 MG capsule Take 2 capsules (120 mg total) by mouth at bedtime. 180 capsule 1  . eszopiclone (LUNESTA) 2 MG TABS tablet TAKE (1) TABLET BY MOUTH AT BEDTIME. 30 tablet 5  . modafinil (PROVIGIL) 200 MG tablet Take 1 tab po q am and 1/2 tab po qd prn 45 tablet 5  . Multiple Vitamin (MULTIVITAMIN) tablet Take 1 tablet by mouth daily.    Marland Kitchen oxybutynin  (DITROPAN-XL) 10 MG 24 hr tablet Take 10 mg by mouth at bedtime.    . SUMAtriptan (IMITREX) 100 MG tablet Take 100 mg by mouth every 2 (two) hours as needed for migraine or headache. May repeat in 2 hours if headache persists or recurs.     No current facility-administered medications for this visit.    Allergies  Allergen Reactions  . Ticlid [Ticlopidine] Shortness Of Breath  . Nsaids Other (See Comments)    Bleeding ulcer  . Prednisone Other (See Comments)    Makes her stomach hurt--knows this isn't an allergy but doesn't want to take it   . Monistat [Miconazole] Swelling and Rash  . Sulfa Antibiotics Rash     REVIEW OF SYSTEMS:   [X]  denotes positive finding, [ ]  denotes negative finding Cardiac  Comments:  Chest pain or chest pressure:    Shortness of breath upon exertion:    Short of breath when lying flat:    Irregular heart rhythm:        Vascular    Pain in calf, thigh, or hip brought on by ambulation:    Pain in feet at night that wakes you up from your sleep:     Blood clot in your veins:    Leg swelling:         Pulmonary    Oxygen at home:    Productive cough:     Wheezing:         Neurologic    Sudden weakness in arms or legs:     Sudden numbness in arms or legs:     Sudden onset of difficulty speaking or slurred speech:    Temporary loss of vision in one eye:     Problems with dizziness:         Gastrointestinal    Blood in stool:     Vomited blood:         Genitourinary    Burning when urinating:     Blood in urine:        Psychiatric    Major depression:         Hematologic    Bleeding problems:    Problems with blood clotting too easily:        Skin    Rashes or ulcers:        Constitutional    Fever or chills:      PHYSICAL EXAMINATION:  Vitals:   02/05/21 1352  BP: 120/81  Pulse: 75  Resp: 20  Temp: 98 F (36.7 C)  TempSrc: Temporal  SpO2: 95%  Weight: 206 lb 9.6 oz (93.7 kg)  Height: 5\' 5"  (1.651 m)    General:   WDWN in NAD; vital signs documented above Gait: Not observed HENT: WNL, normocephalic Pulmonary: normal non-labored breathing  Cardiac: regular HR Abdomen: soft, NT, no masses Skin: without rashes Vascular Exam/Pulses:  Right Left  Radial 2+ (normal) 2+ (normal)  DP 2+ (normal) 2+ (normal)   Extremities: without ischemic changes, without Gangrene , without cellulitis; without open wounds;  Musculoskeletal: no muscle wasting or atrophy  Neurologic: A&O X 3;  No focal weakness or paresthesias are detected Psychiatric:  The pt has Normal affect.   Non-Invasive Vascular Imaging:   L CFV reflux L SFJ reflux Chronic thrombus L SSV    ASSESSMENT/PLAN:: 75 y.o. female here for evaluation of left calf pain and ankle edema  -Chronic appearing thrombus noted in left small saphenous vein on duplex; duplex also demonstrated common femoral vein reflux and saphenofemoral junction reflux however greater saphenous vein is small in diameter -Recommendations included knee-high 15 to 20 mmHg compression stockings to be worn regularly -We also discussed proper form for elevation of her legs; she should also make an effort to avoid prolonged sitting and standing -We discussed the use of NSAIDs, more specifically a baby aspirin; she has a diagnosis of peptic ulcers on her chart however she believes that a short duration of baby aspirin would be okay -Finally we also discussed the use of a warm compress in the area of her left small saphenous vein -Going forward, patient does have mild venous insufficiency however she would not be a candidate for venous ablation; she could manage venous symptoms using the above conservative measures; she may follow-up on an as-needed basis   Dagoberto Ligas, PA-C [facility] [phone]  Clinic MD:   Trula Slade

## 2021-03-13 DIAGNOSIS — Z1231 Encounter for screening mammogram for malignant neoplasm of breast: Secondary | ICD-10-CM | POA: Diagnosis not present

## 2021-03-20 ENCOUNTER — Encounter: Payer: Self-pay | Admitting: Psychiatry

## 2021-03-20 ENCOUNTER — Ambulatory Visit (INDEPENDENT_AMBULATORY_CARE_PROVIDER_SITE_OTHER): Payer: PPO | Admitting: Psychiatry

## 2021-03-20 ENCOUNTER — Other Ambulatory Visit: Payer: Self-pay

## 2021-03-20 DIAGNOSIS — F5101 Primary insomnia: Secondary | ICD-10-CM

## 2021-03-20 DIAGNOSIS — F482 Pseudobulbar affect: Secondary | ICD-10-CM | POA: Diagnosis not present

## 2021-03-20 DIAGNOSIS — F3341 Major depressive disorder, recurrent, in partial remission: Secondary | ICD-10-CM | POA: Diagnosis not present

## 2021-03-20 MED ORDER — ESZOPICLONE 2 MG PO TABS: ORAL_TABLET | ORAL | 5 refills | Status: DC

## 2021-03-20 MED ORDER — ARIPIPRAZOLE 5 MG PO TABS: 7.5000 mg | ORAL_TABLET | Freq: Every day | ORAL | 0 refills | Status: DC

## 2021-03-20 MED ORDER — DULOXETINE HCL 60 MG PO CPEP: 120.0000 mg | ORAL_CAPSULE | Freq: Every day | ORAL | 1 refills | Status: DC

## 2021-03-20 MED ORDER — MODAFINIL 200 MG PO TABS: ORAL_TABLET | ORAL | 5 refills | Status: DC

## 2021-03-20 NOTE — Progress Notes (Signed)
   03/20/21 1316  Facial and Oral Movements  Muscles of Facial Expression 0  Lips and Perioral Area 0  Jaw 0  Tongue 0  Extremity Movements  Upper (arms, wrists, hands, fingers) 0  Lower (legs, knees, ankles, toes) 0  Trunk Movements  Neck, shoulders, hips 0  Overall Severity  Severity of abnormal movements (highest score from questions above) 0  Incapacitation due to abnormal movements 0  Patient's awareness of abnormal movements (rate only patient's report) 0  AIMS Total Score  AIMS Total Score 0
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
## 2021-04-17 DIAGNOSIS — M545 Low back pain, unspecified: Secondary | ICD-10-CM | POA: Diagnosis not present

## 2021-04-17 DIAGNOSIS — M25562 Pain in left knee: Secondary | ICD-10-CM | POA: Diagnosis not present

## 2021-05-21 ENCOUNTER — Other Ambulatory Visit: Payer: Self-pay | Admitting: Psychiatry

## 2021-05-21 DIAGNOSIS — F3341 Major depressive disorder, recurrent, in partial remission: Secondary | ICD-10-CM

## 2021-06-21 ENCOUNTER — Other Ambulatory Visit: Payer: Self-pay | Admitting: Psychiatry

## 2021-06-21 DIAGNOSIS — F3341 Major depressive disorder, recurrent, in partial remission: Secondary | ICD-10-CM

## 2021-06-22 NOTE — Telephone Encounter (Signed)
Pt has an appt 9/13 please fill med. She is out

## 2021-06-22 NOTE — Telephone Encounter (Signed)
Please schedule appt

## 2021-06-26 DIAGNOSIS — M25562 Pain in left knee: Secondary | ICD-10-CM | POA: Diagnosis not present

## 2021-07-19 ENCOUNTER — Other Ambulatory Visit: Payer: Self-pay | Admitting: Psychiatry

## 2021-07-19 DIAGNOSIS — F3341 Major depressive disorder, recurrent, in partial remission: Secondary | ICD-10-CM

## 2021-07-24 ENCOUNTER — Other Ambulatory Visit: Payer: Self-pay

## 2021-07-24 ENCOUNTER — Ambulatory Visit: Payer: PPO | Admitting: Psychiatry

## 2021-07-24 ENCOUNTER — Encounter: Payer: Self-pay | Admitting: Psychiatry

## 2021-07-24 DIAGNOSIS — F5101 Primary insomnia: Secondary | ICD-10-CM

## 2021-07-24 DIAGNOSIS — F482 Pseudobulbar affect: Secondary | ICD-10-CM

## 2021-07-24 DIAGNOSIS — F3341 Major depressive disorder, recurrent, in partial remission: Secondary | ICD-10-CM | POA: Diagnosis not present

## 2021-07-24 MED ORDER — ESZOPICLONE 2 MG PO TABS: ORAL_TABLET | ORAL | 5 refills | Status: DC

## 2021-07-24 MED ORDER — DULOXETINE HCL 60 MG PO CPEP: 120.0000 mg | ORAL_CAPSULE | Freq: Every day | ORAL | 1 refills | Status: DC

## 2021-07-24 MED ORDER — MODAFINIL 200 MG PO TABS: ORAL_TABLET | ORAL | 4 refills | Status: DC

## 2021-07-24 MED ORDER — ARIPIPRAZOLE 10 MG PO TABS: 10.0000 mg | ORAL_TABLET | Freq: Every day | ORAL | 0 refills | Status: DC

## 2021-07-24 NOTE — Progress Notes (Signed)
   07/24/21 1038  Facial and Oral Movements  Muscles of Facial Expression 0  Lips and Perioral Area 0  Jaw 0  Tongue 0  Extremity Movements  Upper (arms, wrists, hands, fingers) 0  Lower (legs, knees, ankles, toes) 0  Trunk Movements  Neck, shoulders, hips 0  Overall Severity  Severity of abnormal movements (highest score from questions above) 0  Incapacitation due to abnormal movements 0  Patient's awareness of abnormal movements (rate only patient's report) 0  AIMS Total Score  AIMS Total Score 0
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
## 2021-08-21 ENCOUNTER — Telehealth: Payer: Self-pay | Admitting: Psychiatry

## 2021-08-21 ENCOUNTER — Other Ambulatory Visit: Payer: Self-pay

## 2021-08-21 DIAGNOSIS — F40243 Fear of flying: Secondary | ICD-10-CM

## 2021-08-21 NOTE — Telephone Encounter (Addendum)
Pt called requesting Rx for Xanax. Pt has some out dated 2020.Having panic attacks. Didn't think she would need. Now she thinks she does. Pharmacy [facility] [address]

## 2021-08-21 NOTE — Telephone Encounter (Signed)
She has not had a recent Rx. Will pend as previous one written

## 2021-08-22 MED ORDER — ALPRAZOLAM 0.5 MG PO TABS: ORAL_TABLET | ORAL | 0 refills | Status: DC

## 2021-09-25 ENCOUNTER — Other Ambulatory Visit: Payer: Self-pay

## 2021-09-25 ENCOUNTER — Encounter: Payer: Self-pay | Admitting: Psychiatry

## 2021-09-25 ENCOUNTER — Ambulatory Visit (INDEPENDENT_AMBULATORY_CARE_PROVIDER_SITE_OTHER): Payer: PPO | Admitting: Psychiatry

## 2021-09-25 DIAGNOSIS — F5101 Primary insomnia: Secondary | ICD-10-CM

## 2021-09-25 DIAGNOSIS — F3341 Major depressive disorder, recurrent, in partial remission: Secondary | ICD-10-CM

## 2021-09-25 MED ORDER — ARIPIPRAZOLE 10 MG PO TABS: 10.0000 mg | ORAL_TABLET | Freq: Every day | ORAL | 0 refills | Status: DC

## 2021-09-25 NOTE — Progress Notes (Signed)
[name] [id number] 1946/08/17 75 y.o.  Subjective:   Patient ID:  [name] is a 75 y.o. (DOB [birth year]) female.  Chief Complaint:  Chief Complaint  Patient presents with   Follow-up    Anxiety, depression, Insomnia    HPI [name] presents to the office today for follow-up of depression, anxiety, and insomnia. She enjoyed her trip to [place] and then [employer]. She took Xanax prn during flight and that helped with anxiety. She reports intrusive thoughts improved with increase in Abilify.  Denies any recent depression. She reports that she has some anxiety. Daughter wants to host Thanksgiving but daughter recently had surgery. She is worried about hosting might be too much for her daughter. She anticipates needing to help daughter. Daughter's husband has not had a steady job for 2.5 years while daughter has gone through cancer. Occ rumination about this situation and recently had difficulty falling asleep as a result. Sleep is adequate most of the time. Energy and motivation are lower than she would like. She reports poor concentration. Denies SI.   Oldest son is in [place].  Notices she is "clicking her teeth" and is no longer licking her lips. She reports, "I'm conscious of it and just can't stop it." She reports that she started tics when daughter was dx'd with cancer. She reports that tics will change periodically.   Past medication trials: Celexa Pristiq Wellbutrin-hallucinations Effexor Prozac Zoloft Lexapro Nefazodone-effective and well-tolerated Cymbalta Abilify Nuedexta Trazodone Gabapentin- Disrupted sleep schedules, nightmares.  Vistaril Nuvigil Provigil Memantine Lunesta- Effective for insomnia    AIMS    Flowsheet Row Office Visit from 07/24/2021 in [place] Office Visit from 03/20/2021 in [place] Office Visit from 10/10/2020 in [place] Visit from 05/23/2020 in [place] Visit from 05/26/2019 in [place] Total Score 0 0 0 0 1      [place] Office Visit from 01/30/2021 in [place]  Total GAD-7 Score 1      PHQ2-9    [place] Visit from 01/30/2021 in [place] Visit from 05/11/2019 in [place] Nutrition from [facility] Nutrition from 02/27/2015 [facility]  PHQ-2 Total Score 2 1 0 0  PHQ-9 Total Score 6 -- -- --        Review of Systems:  Review of Systems  Musculoskeletal:  Negative for gait problem.  Neurological:        Rare tremor when she is anxious about trying to find something and that it is brief in duration.  Psychiatric/Behavioral:         Please refer to HPI   Medications: I have reviewed the patient's current medications.  Current Outpatient Medications  Medication Sig Dispense Refill   cholecalciferol (VITAMIN D3) 25 MCG (1000 UNIT) tablet Take 3,000 Units by mouth daily.     DULoxetine (CYMBALTA) 60 MG capsule Take 2 capsules (120 mg total) by mouth at bedtime. 180 capsule 1   eszopiclone (LUNESTA) 2 MG TABS tablet TAKE (1) TABLET BY MOUTH AT BEDTIME. 30 tablet 5   modafinil (PROVIGIL) 200 MG tablet Take 1 tab po q am and 1/2 tab po qd prn 45 tablet 4   oxybutynin (DITROPAN XL) 15 MG 24 hr tablet Take 15 mg by mouth at bedtime.     SUMAtriptan (IMITREX) 100  MG tablet Take 100 mg by mouth every 2 (two) hours as needed for migraine or headache. May repeat in 2 hours if headache persists or recurs.     ALPRAZolam (XANAX) 0.5 MG tablet Take 1/2-2 tabs po qd prn severe anxiety 10 tablet 0   ARIPiprazole (ABILIFY) 10 MG tablet Take 1 tablet (10 mg total) by mouth daily. 90 tablet 0   Multiple Vitamin (MULTIVITAMIN) tablet Take 1 tablet by mouth daily. (Patient not taking: Reported on 07/24/2021)     No current facility-administered  medications for this visit.    Medication Side Effects: None  Allergies:  Allergies  Allergen Reactions   Ticlid [Ticlopidine] Shortness Of Breath   Nsaids Other (See Comments)    Bleeding ulcer   Prednisone Other (See Comments)    Makes her stomach hurt--knows this isn't an allergy but doesn't want to take it    Monistat [Miconazole] Swelling and Rash   Sulfa Antibiotics Rash    Past Medical History:  Diagnosis Date   Arthritis    Depression    H/O head injury    Hearing loss    History of stomach ulcers    Migraine    Stroke [facility])     Past Medical History, Surgical history, Social history, and Family history were reviewed and updated as appropriate.   Please see review of systems for further details on the patient's review from today.   Objective:   Physical Exam:  There were no vitals taken for this visit.  Physical Exam Constitutional:      General: She is not in acute distress. Musculoskeletal:        General: No deformity.  Neurological:     Mental Status: She is alert and oriented to person, place, and time.     Coordination: Coordination normal.  Psychiatric:        Attention and Perception: Attention and perception normal. She does not perceive auditory or visual hallucinations.        Mood and Affect: Mood is not depressed. Affect is not labile, blunt, angry or inappropriate.        Speech: Speech normal.        Behavior: Behavior normal.        Thought Content: Thought content normal. Thought content is not paranoid or delusional. Thought content does not include homicidal or suicidal ideation. Thought content does not include homicidal or suicidal plan.        Cognition and Memory: Cognition and memory normal.        Judgment: Judgment normal.     Comments: Insight intact Some anxiety about family    Lab Review:     Component Value Date/Time   NA 140 05/27/2017 1553   K 5.0 05/27/2017 1553   CL 97 05/27/2017 1553   CO2 27 05/27/2017 1553    GLUCOSE 81 05/27/2017 1553   GLUCOSE 109 (H) 12/20/2015 1915   BUN 15 05/27/2017 1553   CREATININE 0.66 05/27/2017 1553   CALCIUM 10.2 05/27/2017 1553   PROT 7.4 05/27/2017 1553   ALBUMIN 4.5 05/27/2017 1553   AST 26 05/27/2017 1553   ALT 20 05/27/2017 1553   ALKPHOS 69 05/27/2017 1553   BILITOT 0.4 05/27/2017 1553   GFRNONAA 90 05/27/2017 1553   GFRAA 104 05/27/2017 1553       Component Value Date/Time   WBC 14.1 (H) 12/20/2015 1905   RBC 4.24 12/20/2015 1905   HGB 14.6 12/20/2015 1915   HCT 43.0 12/20/2015 1915  PLT 253 12/20/2015 1905   MCV 96.2 12/20/2015 1905   MCH 30.7 12/20/2015 1905   MCHC 31.9 12/20/2015 1905   RDW 13.4 12/20/2015 1905    No results found for: POCLITH, LITHIUM   No results found for: PHENYTOIN, PHENOBARB, VALPROATE, CBMZ   .res Assessment: Plan:   Pt seen for 30 minutes and time spent discussing recent anxiety in response to family stressors. She reports that anxiety is manageable and does not think that she needs a change in treatment. Also discussed tics that she has had since daughter was diagnosed with cancer prior to starting Abilify. She reports that tics have changed from tapping her foot, to licking her lips, to now tapping her teeth together. She reports that tics do not seem to be side effect of Abilify and prefers to continue Abilify.  Continue Abilify 10 mg po qd mood s/s.  Continue Xanax 0.5 mg 1/2-2 tabs po qd prn severe anxiety.  Continue Cymbalta 120 mg po qd for anxiety and depression.  Continue Lunesta 2 mg po QHS for insomnia.  Continue Modafinil 200 mg po q am and 1/2 tab po qd prn.  Pt to follow-up in 3 months or sooner if clinically indicated.  Patient advised to contact office with any questions, adverse effects, or acute worsening in signs and symptoms.    Christon was seen today for follow-up.  Diagnoses and all orders for this visit:  Recurrent major depressive disorder, in partial remission ([place]) -     ARIPiprazole  (ABILIFY) 10 MG tablet; Take 1 tablet (10 mg total) by mouth daily.  Primary insomnia    Please see After Visit Summary for patient specific instructions.  Future Appointments  Date Time Provider [place]  12/25/2021 11:30 AM Thayer Headings, PMHNP [facility] None    No orders of the defined types were placed in this encounter.   -------------------------------

## 2021-11-20 DIAGNOSIS — M25562 Pain in left knee: Secondary | ICD-10-CM | POA: Diagnosis not present

## 2021-12-03 ENCOUNTER — Telehealth: Payer: Self-pay | Admitting: Psychiatry

## 2021-12-03 NOTE — Telephone Encounter (Signed)
Pt lvm that her insurance will not cover medicine. She said insurance sent over a form to be filled out. Please let her know if you received it.336 [phone]

## 2021-12-03 NOTE — Telephone Encounter (Signed)
Tier reduction was received last week on pt's Modafinil. Records submitted per request on 11/30/21.  Pt takes Modafinil 200 mg 1 tablet daily, and 1/2 tablet prn. #45  Through [facility] it would cost approximately $20-22.00/30 day supply. [name] will inform pt in case she needs to get prior to a decision about tier reduction.

## 2021-12-25 ENCOUNTER — Ambulatory Visit: Payer: PPO | Admitting: Psychiatry

## 2021-12-26 ENCOUNTER — Other Ambulatory Visit: Payer: Self-pay

## 2021-12-26 ENCOUNTER — Encounter: Payer: Self-pay | Admitting: Psychiatry

## 2021-12-26 ENCOUNTER — Ambulatory Visit: Payer: PPO | Admitting: Psychiatry

## 2021-12-26 DIAGNOSIS — F332 Major depressive disorder, recurrent severe without psychotic features: Secondary | ICD-10-CM

## 2021-12-26 DIAGNOSIS — F482 Pseudobulbar affect: Secondary | ICD-10-CM

## 2021-12-26 DIAGNOSIS — F5101 Primary insomnia: Secondary | ICD-10-CM

## 2021-12-26 MED ORDER — DULOXETINE HCL 60 MG PO CPEP: 120.0000 mg | ORAL_CAPSULE | Freq: Every day | ORAL | 1 refills | Status: DC

## 2021-12-26 MED ORDER — LURASIDONE HCL 80 MG PO TABS
40.0000 mg | ORAL_TABLET | Freq: Every day | ORAL | 0 refills | Status: DC
Start: 2022-01-02 — End: 2022-01-23

## 2021-12-26 MED ORDER — MODAFINIL 200 MG PO TABS: ORAL_TABLET | ORAL | 4 refills | Status: DC

## 2021-12-26 MED ORDER — LURASIDONE HCL 20 MG PO TABS: 20.0000 mg | ORAL_TABLET | Freq: Every day | ORAL | 0 refills | Status: DC

## 2021-12-26 MED ORDER — ESZOPICLONE 2 MG PO TABS: ORAL_TABLET | ORAL | 5 refills | Status: DC

## 2021-12-26 MED ORDER — LURASIDONE HCL 40 MG PO TABS: 40.0000 mg | ORAL_TABLET | Freq: Every day | ORAL | 1 refills | Status: DC

## 2021-12-26 NOTE — Patient Instructions (Addendum)
-  Stop Aripiprazole (Abilify). No need to taper since blood levels gradually decrease over 2-3 weeks.   -Start Latuda (Lurasidone) 20 mg daily with supper for one week, then increase to 40 mg (1/2 of an 80 mg tabs) daily with supper. A prescription for the 40 mg tabs has been sent to your pharmacy to be on hold until 01/09/22 when the generic should be available.   -Please call if you have any side effects or issues.

## 2021-12-26 NOTE — Progress Notes (Signed)
ONA ROEHRS [id number] 09-04-1946 76 y.o.  Subjective:   Patient ID:  [name] is a 76 y.o. (DOB Aug 24, 1946) female.  Chief Complaint:  Chief Complaint  Patient presents with   Depression    HPI Dominiqua Cooner Nooney presents to the office today for follow-up of depression, anxiety, and insomnia. She reports, "I've been really low since Christmas." She reporst that she has not been going out or getting together with friends. She reports that she has been staying on the couch and watching TV. She reports persistent sad mood. "I have no energy. I haven't even put stuff up from Christmas." Motivation has been very low. She reports that she has not been doing household chores. Diminished interest in things. She goes to bed around 11-11:15. She reports that she then awakens around 4 am and returns to sleep around 6 am. She reports that she will occasionally stay in the bed. Anhedonia. She reports gaining weight since Christmas. Difficulty with concentration. She reports that she has difficulty with word finding and her memory has not been as good. Denies irritability. Denies SI.   She reports that she usually notices lower mood for about a week after Christmas and "this time I can't bring myself out of it." She reports frequent worry. Denies panic s/s.  Denies any recent tics.   Reports that a friend died recently with cancer at age 19. Another friend has esophageal cancer.    Past medication trials: Celexa Pristiq Wellbutrin-hallucinations Effexor Prozac Zoloft Lexapro Nefazodone-effective and well-tolerated Cymbalta Abilify Nuedexta Trazodone Gabapentin- Disrupted sleep schedules, nightmares.  Vistaril Nuvigil Provigil Memantine Lunesta- Effective for insomnia    AIMS    Flowsheet Row Office Visit from 07/24/2021 in [place] Office Visit from 03/20/2021 in [place] Office Visit from 10/10/2020 in [place] Visit  from 05/23/2020 in [place] Visit from 05/26/2019 in [place] Total Score 0 0 0 0 1      [place] Office Visit from 01/30/2021 in [place]  Total GAD-7 Score 1      PHQ2-9    [place] Visit from 01/30/2021 in [place] Visit from 05/11/2019 in [place] [facility]27/2015 [facility]  PHQ-2 Total Score 2 1 0 0  PHQ-9 Total Score 6 -- -- --        Review of Systems:  Review of Systems  HENT:  Positive for dental problem.   Musculoskeletal:  Negative for gait problem.  Neurological:  Negative for tremors.  Psychiatric/Behavioral:         Please refer to HPI   Medications: I have reviewed the patient's current medications.  Current Outpatient Medications  Medication Sig Dispense Refill   cholecalciferol (VITAMIN D3) 25 MCG (1000 UNIT) tablet Take 3,000 Units by mouth daily.     lurasidone (LATUDA) 20 MG TABS tablet Take 1 tablet (20 mg total) by mouth daily with supper for 7 days. 7 tablet 0   [START ON 01/09/2022] lurasidone (LATUDA) 40 MG TABS tablet Take 1 tablet (40 mg total) by mouth daily with supper. 30 tablet 1   [START ON 01/02/2022] lurasidone (LATUDA) 80 MG TABS tablet Take 0.5 tablets (40 mg total) by mouth daily with breakfast. 7 tablet 0   ALPRAZolam (XANAX) 0.5 MG tablet Take 1/2-2 tabs po qd  prn severe anxiety 10 tablet 0   DULoxetine (CYMBALTA) 60 MG capsule Take 2 capsules (120 mg total) by mouth at bedtime. 180 capsule 1   [START ON 01/16/2022] eszopiclone (LUNESTA) 2 MG TABS tablet TAKE (1) TABLET BY MOUTH AT BEDTIME. 30 tablet 5   modafinil (PROVIGIL) 200 MG tablet Take 1 tab po q am and 1/2 tab po qd prn 45 tablet 4   Multiple Vitamin (MULTIVITAMIN) tablet Take 1 tablet by mouth daily. (Patient not taking: Reported on 07/24/2021)      oxybutynin (DITROPAN XL) 15 MG 24 hr tablet Take 15 mg by mouth at bedtime. (Patient not taking: Reported on 12/26/2021)     SUMAtriptan (IMITREX) 100 MG tablet Take 100 mg by mouth every 2 (two) hours as needed for migraine or headache. May repeat in 2 hours if headache persists or recurs.     No current facility-administered medications for this visit.    Medication Side Effects: None  Allergies:  Allergies  Allergen Reactions   Ticlid [Ticlopidine] Shortness Of Breath   Nsaids Other (See Comments)    Bleeding ulcer   Prednisone Other (See Comments)    Makes her stomach hurt--knows this isn't an allergy but doesn't want to take it    Monistat [Miconazole] Swelling and Rash   Sulfa Antibiotics Rash    Past Medical History:  Diagnosis Date   Arthritis    Depression    H/O head injury    Hearing loss    History of stomach ulcers    Migraine    Stroke [facility])     Past Medical History, Surgical history, Social history, and Family history were reviewed and updated as appropriate.   Please see review of systems for further details on the patient's review from today.   Objective:   Physical Exam:  There were no vitals taken for this visit.  Physical Exam Constitutional:      General: She is not in acute distress. Musculoskeletal:        General: No deformity.  Neurological:     Mental Status: She is alert and oriented to person, place, and time.     Coordination: Coordination normal.  Psychiatric:        Attention and Perception: Attention and perception normal. She does not perceive auditory or visual hallucinations.        Mood and Affect: Mood is depressed. Mood is not anxious. Affect is not labile, blunt, angry or inappropriate.        Speech: Speech normal.        Behavior: Behavior normal.        Thought Content: Thought content normal. Thought content is not paranoid or delusional. Thought content does not include homicidal or suicidal ideation. Thought content  does not include homicidal or suicidal plan.        Cognition and Memory: Cognition and memory normal.        Judgment: Judgment normal.     Comments: Insight intact    Lab Review:     Component Value Date/Time   NA 140 05/27/2017 1553   K 5.0 05/27/2017 1553   CL 97 05/27/2017 1553   CO2 27 05/27/2017 1553   GLUCOSE 81 05/27/2017 1553   GLUCOSE 109 (H) 12/20/2015 1915   BUN 15 05/27/2017 1553   CREATININE 0.66 05/27/2017 1553   CALCIUM 10.2 05/27/2017 1553   PROT 7.4 05/27/2017 1553   ALBUMIN 4.5 05/27/2017 1553   AST 26 05/27/2017 1553   ALT 20  05/27/2017 1553   ALKPHOS 69 05/27/2017 1553   BILITOT 0.4 05/27/2017 1553   GFRNONAA 90 05/27/2017 1553   GFRAA 104 05/27/2017 1553       Component Value Date/Time   WBC 14.1 (H) 12/20/2015 1905   RBC 4.24 12/20/2015 1905   HGB 14.6 12/20/2015 1915   HCT 43.0 12/20/2015 1915   PLT 253 12/20/2015 1905   MCV 96.2 12/20/2015 1905   MCH 30.7 12/20/2015 1905   MCHC 31.9 12/20/2015 1905   RDW 13.4 12/20/2015 1905    No results found for: POCLITH, LITHIUM   No results found for: PHENYTOIN, PHENOBARB, VALPROATE, CBMZ   .res Assessment: Plan:    Pt seen for 30 minutes and time spent discussing possible treatment options for depression. Discussed that Abilify seemed to be helpful initially for depression, seemed to lose effect, was increased and became effective again, then lost effect after a period of time. Discussed alternatives to Abilify. Discussed potential benefits, risks, and side effects of Latuda since it is an atypical antipsychotic as well. Discussed that it is used off-label for depression without Bipolar D/O. Pt agrees to trial of Latuda. Will start Latuda 20 mg daily with supper for one week, then increase to Latuda 40 mg daily.  Advised pt to stop Abilify and discussed that blood levels would gradually decrease over a few weeks and that it would not be necessary to taper.  Will continue Cymbalta 120 mg po qd for  depression and anxiety.  Continue Modafinil 200 mg po q am and 100 mg as needed for excessive somnolence.  Pt to follow-up in 4 weeks or sooner if clinically indicated.  Patient advised to contact office with any questions, adverse effects, or acute worsening in signs and symptoms.   Darby was seen today for depression.  Diagnoses and all orders for this visit:  Severe episode of recurrent major depressive disorder, without psychotic features ([place]) -     DULoxetine (CYMBALTA) 60 MG capsule; Take 2 capsules (120 mg total) by mouth at bedtime. -     lurasidone (LATUDA) 40 MG TABS tablet; Take 1 tablet (40 mg total) by mouth daily with supper. -     modafinil (PROVIGIL) 200 MG tablet; Take 1 tab po q am and 1/2 tab po qd prn -     lurasidone (LATUDA) 20 MG TABS tablet; Take 1 tablet (20 mg total) by mouth daily with supper for 7 days. -     lurasidone (LATUDA) 80 MG TABS tablet; Take 0.5 tablets (40 mg total) by mouth daily with breakfast.  Primary insomnia -     eszopiclone (LUNESTA) 2 MG TABS tablet; TAKE (1) TABLET BY MOUTH AT BEDTIME.  PBA (pseudobulbar affect) -     modafinil (PROVIGIL) 200 MG tablet; Take 1 tab po q am and 1/2 tab po qd prn     Please see After Visit Summary for patient specific instructions.  Future Appointments  Date Time Provider Ronald  01/23/2022 11:30 AM Thayer Headings, PMHNP [facility] None    No orders of the defined types were placed in this encounter.   -------------------------------

## 2022-01-01 ENCOUNTER — Telehealth: Payer: Self-pay | Admitting: Psychiatry

## 2022-01-01 NOTE — Telephone Encounter (Signed)
LVM to rtc 

## 2022-01-01 NOTE — Telephone Encounter (Signed)
Has she experienced any improvement in mood s/s? The movements may be worse right now since she is coming off of Abilify. Is she wanting to stop it immediately or is she ok with giving it more time?

## 2022-01-01 NOTE — Telephone Encounter (Signed)
Pt stated yes her mood has improved.She is willing to stay on it and will call back if it gets worse.

## 2022-01-01 NOTE — Telephone Encounter (Signed)
Pt is on 20 mg of latuda. She is experiencing side effects . The main one is clicking of her teeth constantly. Also she said the cost of the medicine is costing her to much. Please give her a call at [phone]

## 2022-01-01 NOTE — Telephone Encounter (Signed)
Rtc to pt,She started last week on latuda and the only side effect she is having is she is constantly clicking her teeth together.She is also concerned about the price but I informed her it's going generic very soon

## 2022-01-17 DIAGNOSIS — M9903 Segmental and somatic dysfunction of lumbar region: Secondary | ICD-10-CM | POA: Diagnosis not present

## 2022-01-17 DIAGNOSIS — M62830 Muscle spasm of back: Secondary | ICD-10-CM | POA: Diagnosis not present

## 2022-01-17 DIAGNOSIS — M9905 Segmental and somatic dysfunction of pelvic region: Secondary | ICD-10-CM | POA: Diagnosis not present

## 2022-01-17 DIAGNOSIS — M9902 Segmental and somatic dysfunction of thoracic region: Secondary | ICD-10-CM | POA: Diagnosis not present

## 2022-01-23 ENCOUNTER — Encounter: Payer: Self-pay | Admitting: Psychiatry

## 2022-01-23 ENCOUNTER — Telehealth (INDEPENDENT_AMBULATORY_CARE_PROVIDER_SITE_OTHER): Payer: PPO | Admitting: Psychiatry

## 2022-01-23 DIAGNOSIS — F40243 Fear of flying: Secondary | ICD-10-CM

## 2022-01-23 DIAGNOSIS — F332 Major depressive disorder, recurrent severe without psychotic features: Secondary | ICD-10-CM

## 2022-01-23 MED ORDER — ALPRAZOLAM 0.5 MG PO TABS: ORAL_TABLET | ORAL | 1 refills | Status: DC

## 2022-01-23 MED ORDER — LURASIDONE HCL 40 MG PO TABS: 40.0000 mg | ORAL_TABLET | Freq: Every day | ORAL | 1 refills | Status: DC

## 2022-01-23 NOTE — Progress Notes (Signed)
[name] ?[id number] ?06/02/1946 ?76 y.o. ? ?Virtual Visit via Video Note ? ?I connected with pt @ on 01/23/22 at 11:30 AM EDT by a video enabled telemedicine application and verified that I am speaking with the correct person using two identifiers. ?  ?I discussed the limitations of evaluation and management by telemedicine and the availability of in person appointments. The patient expressed understanding and agreed to proceed. ? ?I discussed the assessment and treatment plan with the patient. The patient was provided an opportunity to ask questions and all were answered. The patient agreed with the plan and demonstrated an understanding of the instructions. ?  ?The patient was advised to call back or seek an in-person evaluation if the symptoms worsen or if the condition fails to improve as anticipated. ? ?I provided 30 minutes of non-face-to-face time during this encounter.  The patient was located at home.  The provider was located at [place]. ? ? ?Thayer Headings, PMHNP  ? ?Subjective:  ? ?Patient ID:  [name] is a 76 y.o. (DOB 1946/10/14) female. ? ?Chief Complaint:  ?Chief Complaint  ?Patient presents with  ? Depression  ? Anxiety  ? ? ?HPI  ?[name] presents for follow-up of depression and anxiety. She reports, "I'm better... my overall attitude is better." She notices some gradual improvement in mood. She has been getting out and going to the grocery store and some necessary errands. She has not been visiting friends and "I don't feel ready for that." She reports that she remains more withdrawn than usual. She denies irritability. She reports that she started to click her jaws after starting Latuda and she correlates this with anxiety and h/o nervous tics. She reports that she stopped licking her lips after stopping Abilify. Energy has been low and she reports feeling exhausted after "little things." She reports low motivation. She reports adequate sleep. Appetite has been ok.  She has been cooking more again. Continued low interest in things. She reports that she continues to have difficulty with word finding. Denies SI.  ? ?Husband has been closing at a golf course. He is getting home later and she has some anxiety with being home alone at night and reports that this has been long-standing. She reports that worry that someone may try to break in or come to her door. Denies any other worry. She reports that she tries to watch a funny TV show to distract herself from anxious thoughts.  ? ?Friends and family are doing ok.  ? ?Past medication trials: ?Celexa ?Pristiq ?Wellbutrin-hallucinations ?Effexor ?Prozac ?Zoloft ?Lexapro ?Nefazodone-effective and well-tolerated ?Cymbalta ?Abilify ?Nuedexta ?Trazodone ?Gabapentin- Disrupted sleep schedules, nightmares.  ?Vistaril ?Nuvigil ?Provigil ?Memantine ?Lunesta- Effective for insomnia ? ?Review of Systems:  ?Review of Systems  ?Genitourinary:   ?     Incontinence  ?Musculoskeletal:  Negative for gait problem.  ?     Jaw tightness due to clicking her jaw.  ?Neurological:  Negative for tremors.  ?Psychiatric/Behavioral:    ?     Please refer to HPI  ? ?Medications: I have reviewed the patient's current medications. ? ?Current Outpatient Medications  ?Medication Sig Dispense Refill  ? cholecalciferol (VITAMIN D3) 25 MCG (1000 UNIT) tablet Take 3,000 Units by mouth daily.    ? DULoxetine (CYMBALTA) 60 MG capsule Take 2 capsules (120 mg total) by mouth at bedtime. 180 capsule 1  ? eszopiclone (LUNESTA) 2 MG TABS tablet TAKE (1) TABLET BY MOUTH AT BEDTIME. 30 tablet 5  ? modafinil (  PROVIGIL) 200 MG tablet Take 1 tab po q am and 1/2 tab po qd prn 45 tablet 4  ? tiZANidine (ZANAFLEX) 2 MG tablet Take by mouth every 6 (six) hours as needed for muscle spasms.    ? ALPRAZolam (XANAX) 0.5 MG tablet Take 1/2-2 tabs po qd prn severe anxiety 30 tablet 1  ? lurasidone (LATUDA) 40 MG TABS tablet Take 1 tablet (40 mg total) by mouth daily with supper. 30 tablet 1   ? Multiple Vitamin (MULTIVITAMIN) tablet Take 1 tablet by mouth daily. (Patient not taking: Reported on 07/24/2021)    ? SUMAtriptan (IMITREX) 100 MG tablet Take 100 mg by mouth every 2 (two) hours as needed for migraine or headache. May repeat in 2 hours if headache persists or recurs.    ? ?No current facility-administered medications for this visit.  ? ? ?Medication Side Effects: Other: Jaw movements ? ?Allergies:  ?Allergies  ?Allergen Reactions  ? Ticlid [Ticlopidine] Shortness Of Breath  ? Nsaids Other (See Comments)  ?  Bleeding ulcer  ? Prednisone Other (See Comments)  ?  Makes her stomach hurt--knows this isn't an allergy but doesn't want to take it ?  ? Monistat [Miconazole] Swelling and Rash  ? Sulfa Antibiotics Rash  ? ? ?Past Medical History:  ?Diagnosis Date  ? Arthritis   ? Depression   ? H/O head injury   ? Hearing loss   ? History of stomach ulcers   ? Migraine   ? Stroke [facility])   ? ? ?Family History  ?Problem Relation Age of Onset  ? Cancer Father   ? Heart disease Brother   ? Alcohol abuse Brother   ? Depression Maternal Grandmother   ? Depression Grandchild   ? Breast cancer Daughter   ? ? ?Social History  ? ?Socioeconomic History  ? Marital status: Married  ?  Spouse name: Not on file  ? Number of children: 3  ? Years of education: Not on file  ? Highest education level: Not on file  ?Occupational History  ? Occupation: retired  ?Tobacco Use  ? Smoking status: Never  ? Smokeless tobacco: Never  ?Vaping Use  ? Vaping Use: Never used  ?Substance and Sexual Activity  ? Alcohol use: Yes  ?  Comment: occ beer  ? Drug use: No  ? Sexual activity: Yes  ?  Birth control/protection: Surgical  ?  Comment: hyst  ?Other Topics Concern  ? Not on file  ?Social History Narrative  ? Not on file  ? ?Social Determinants of Health  ? ?Financial Resource Strain: Medium Risk  ? Difficulty of Paying Living Expenses: Somewhat hard  ?Food Insecurity: No Food Insecurity  ? Worried About Charity fundraiser in the Last  Year: Never true  ? Ran Out of Food in the Last Year: Never true  ?Transportation Needs: No Transportation Needs  ? Lack of Transportation (Medical): No  ? Lack of Transportation (Non-Medical): No  ?Physical Activity: Insufficiently Active  ? Days of Exercise per Week: 2 days  ? Minutes of Exercise per Session: 10 min  ?Stress: No Stress Concern Present  ? Feeling of Stress : Only a little  ?Social Connections: Moderately Integrated  ? Frequency of Communication with Friends and Family: Twice a week  ? Frequency of Social Gatherings with Friends and Family: Twice a week  ? Attends Religious Services: More than 4 times per year  ? Active Member of Clubs or Organizations: No  ? Attends Archivist  Meetings: Never  ? Marital Status: Married  ?Intimate Partner Violence: Not At Risk  ? Fear of Current or Ex-Partner: No  ? Emotionally Abused: No  ? Physically Abused: No  ? Sexually Abused: No  ? ? ?Past Medical History, Surgical history, Social history, and Family history were reviewed and updated as appropriate.  ? ?Please see review of systems for further details on the patient's review from today.  ? ?Objective:  ? ?Physical Exam:  ?There were no vitals taken for this visit. ? ?Physical Exam ?Constitutional:   ?   General: She is not in acute distress. ?Musculoskeletal:     ?   General: No deformity.  ?Neurological:  ?   Mental Status: She is alert and oriented to person, place, and time.  ?   Coordination: Coordination normal.  ?Psychiatric:     ?   Attention and Perception: Attention and perception normal. She does not perceive auditory or visual hallucinations.     ?   Mood and Affect: Mood is anxious and depressed. Affect is not labile, blunt, angry or inappropriate.     ?   Speech: Speech normal.     ?   Behavior: Behavior normal.     ?   Thought Content: Thought content normal. Thought content is not paranoid or delusional. Thought content does not include homicidal or suicidal ideation. Thought content  does not include homicidal or suicidal plan.     ?   Cognition and Memory: Cognition and memory normal.     ?   Judgment: Judgment normal.  ?   Comments: Insight intact ?Mood presents as less depressed c

## 2022-01-25 DIAGNOSIS — R350 Frequency of micturition: Secondary | ICD-10-CM | POA: Diagnosis not present

## 2022-01-25 DIAGNOSIS — R351 Nocturia: Secondary | ICD-10-CM | POA: Diagnosis not present

## 2022-01-25 DIAGNOSIS — N3946 Mixed incontinence: Secondary | ICD-10-CM | POA: Diagnosis not present

## 2022-02-05 DIAGNOSIS — Z0001 Encounter for general adult medical examination with abnormal findings: Secondary | ICD-10-CM | POA: Diagnosis not present

## 2022-02-14 ENCOUNTER — Telehealth (INDEPENDENT_AMBULATORY_CARE_PROVIDER_SITE_OTHER): Payer: PPO | Admitting: Psychiatry

## 2022-02-14 ENCOUNTER — Encounter: Payer: Self-pay | Admitting: Psychiatry

## 2022-02-14 DIAGNOSIS — F332 Major depressive disorder, recurrent severe without psychotic features: Secondary | ICD-10-CM | POA: Diagnosis not present

## 2022-02-14 MED ORDER — LURASIDONE HCL 40 MG PO TABS: 40.0000 mg | ORAL_TABLET | Freq: Every day | ORAL | 1 refills | Status: DC

## 2022-02-14 MED ORDER — LAMOTRIGINE 100 MG PO TABS: ORAL_TABLET | ORAL | 0 refills | Status: DC

## 2022-02-14 MED ORDER — LAMOTRIGINE 25 MG PO TABS: ORAL_TABLET | ORAL | 0 refills | Status: DC

## 2022-02-14 NOTE — Progress Notes (Signed)
[name] ?[id number] ?07/02/1946 ?76 y.o. ? ?Virtual Visit via Video Note ? ?I connected with pt @ on 02/14/22 at  1:30 PM EDT by a video enabled telemedicine application and verified that I am speaking with the correct person using two identifiers. ?  ?I discussed the limitations of evaluation and management by telemedicine and the availability of in person appointments. The patient expressed understanding and agreed to proceed. ? ?I discussed the assessment and treatment plan with the patient. The patient was provided an opportunity to ask questions and all were answered. The patient agreed with the plan and demonstrated an understanding of the instructions. ?  ?The patient was advised to call back or seek an in-person evaluation if the symptoms worsen or if the condition fails to improve as anticipated. ? ?I provided 30 minutes of non-face-to-face time during this encounter.  The patient was located at home.  The provider was located at [place]. ? ? ?[name], PMHNP  ? ?Subjective:  ? ?Patient ID:  [name] is a 76 y.o. (DOB 1945/11/18) female. ? ?Chief Complaint:  ?Chief Complaint  ?Patient presents with  ? Depression  ? Anxiety  ? ? ?HPI ?[name] presents for follow-up of depression, anxiety, and insomnia. She reports, "I'm better." She reports that her depression is "a whole lot better... comes and goes." She reports that her anxiety comes and goes and it typically worse wen husband works later. She reports that she has anxiety about being at home by herself at night. She reports that other types of anxiety has improved- "no more than usual." She went to visit a friend recently. She has been calling friends and getting back in touch. She has been trying to get out and go to stores. She reports that her energy is "slowly coming up." She cleaned recently cleaned and felt tired afterwards. Put away Christmas decorations. She reports that her motivation is low. Sleeping well  with medication and is not having to wake up to go to the bathroom. Appetite has been good. She has been less interested in things. Has not been interested in decorating for Easter like she normally would. Some difficulty wit concentration. She reports that she read a book recently. Less word finding difficulties. Denies SI.  ? ?She reports that her jaw is "moving all the time." Denies any change in jaw movements. No recent lip licking.  ? ?She has not taken Xanax prn recently. She may take a 1/2 tab of Xanax tomorrow when husband is working later.  ? ?She has not been using modafinil every day. She reports "it makes me more aware of things I need to do." Noticed when she did not take Modafinil she sat in her chair and watched TV.  ? ?Past medication trials: ?Celexa ?Pristiq ?Wellbutrin-hallucinations ?Effexor ?Prozac ?Zoloft ?Lexapro ?Nefazodone-effective and well-tolerated ?Cymbalta ?Abilify ?Nuedexta ?Trazodone ?Gabapentin- Disrupted sleep schedules, nightmares.  ?Vistaril ?Nuvigil ?Provigil ?Memantine ?Lunesta- Effective for insomnia ? ?Review of Systems:  ?Review of Systems  ?Genitourinary:   ?     Urinary incontinence has improved with new medication. She will not drink fluids after supper.   ?Musculoskeletal:  Negative for gait problem.  ?Neurological:   ?     Has had 2 migraines recently.   ?Psychiatric/Behavioral:    ?     Please refer to HPI  ? ?Medications: I have reviewed the patient's current medications. ? ?Current Outpatient Medications  ?Medication Sig Dispense Refill  ? ALPRAZolam (XANAX) 0.5 MG tablet  Take 1/2-2 tabs po qd prn severe anxiety 30 tablet 1  ? cholecalciferol (VITAMIN D3) 25 MCG (1000 UNIT) tablet Take 3,000 Units by mouth daily.    ? DULoxetine (CYMBALTA) 60 MG capsule Take 2 capsules (120 mg total) by mouth at bedtime. 180 capsule 1  ? eszopiclone (LUNESTA) 2 MG TABS tablet TAKE (1) TABLET BY MOUTH AT BEDTIME. 30 tablet 5  ? lamoTRIgine (LAMICTAL) 100 MG tablet Take 1 tab po qd x 2  weeks, then increase to 1.5 tabs po qd 45 tablet 0  ? lamoTRIgine (LAMICTAL) 25 MG tablet Take 1 tablet (25 mg total) by mouth daily for 14 days, THEN 2 tablets (50 mg total) daily for 14 days. 45 tablet 0  ? modafinil (PROVIGIL) 200 MG tablet Take 1 tab po q am and 1/2 tab po qd prn 45 tablet 4  ? MYRBETRIQ 50 MG TB24 tablet Take 50 mg by mouth daily.    ? SUMAtriptan (IMITREX) 100 MG tablet Take 100 mg by mouth every 2 (two) hours as needed for migraine or headache. May repeat in 2 hours if headache persists or recurs.    ? lurasidone (LATUDA) 40 MG TABS tablet Take 1 tablet (40 mg total) by mouth daily with supper. 30 tablet 1  ? Multiple Vitamin (MULTIVITAMIN) tablet Take 1 tablet by mouth daily. (Patient not taking: Reported on 07/24/2021)    ? tiZANidine (ZANAFLEX) 2 MG tablet Take by mouth every 6 (six) hours as needed for muscle spasms. (Patient not taking: Reported on 02/14/2022)    ? ?No current facility-administered medications for this visit.  ? ? ?Medication Side Effects: Other: Jaw movements ? ?Allergies:  ?Allergies  ?Allergen Reactions  ? Ticlid [Ticlopidine] Shortness Of Breath  ? Nsaids Other (See Comments)  ?  Bleeding ulcer  ? Prednisone Other (See Comments)  ?  Makes her stomach hurt--knows this isn't an allergy but doesn't want to take it ?  ? Monistat [Miconazole] Swelling and Rash  ? Sulfa Antibiotics Rash  ? ? ?Past Medical History:  ?Diagnosis Date  ? Arthritis   ? Depression   ? H/O head injury   ? Hearing loss   ? History of stomach ulcers   ? Migraine   ? Stroke [facility])   ? ? ?Family History  ?Problem Relation Age of Onset  ? Cancer Father   ? Heart disease Brother   ? Alcohol abuse Brother   ? Depression Maternal Grandmother   ? Depression Grandchild   ? Breast cancer Daughter   ? ? ?Social History  ? ?Socioeconomic History  ? Marital status: Married  ?  Spouse name: Not on Smith  ? Number of children: 3  ? Years of education: Not on Smith  ? Highest education level: Not on Smith  ?Occupational  History  ? Occupation: retired  ?Tobacco Use  ? Smoking status: Never  ? Smokeless tobacco: Never  ?Vaping Use  ? Vaping Use: Never used  ?Substance and Sexual Activity  ? Alcohol use: Yes  ?  Comment: occ beer  ? Drug use: No  ? Sexual activity: Yes  ?  Birth control/protection: Surgical  ?  Comment: hyst  ?Other Topics Concern  ? Not on Smith  ?Social History Narrative  ? Not on Smith  ? ?Social Determinants of Health  ? ?Financial Resource Strain: Not on Smith  ?Food Insecurity: Not on Smith  ?Transportation Needs: Not on Smith  ?Physical Activity: Not on Smith  ?Stress: Not on Smith  ?  Social Connections: Not on Smith  ?Intimate Partner Violence: Not on Smith  ? ? ?Past Medical History, Surgical history, Social history, and Family history were reviewed and updated as appropriate.  ? ?Please see review of systems for further details on the patient's review from today.  ? ?Objective:  ? ?Physical Exam:  ?There were no vitals taken for this visit. ? ?Physical Exam ?Neurological:  ?   Mental Status: She is alert and oriented to person, place, and time.  ?   Cranial Nerves: No dysarthria.  ?Psychiatric:     ?   Attention and Perception: Attention and perception normal.     ?   Mood and Affect: Mood is depressed.     ?   Speech: Speech normal.     ?   Behavior: Behavior is cooperative.     ?   Thought Content: Thought content normal. Thought content is not paranoid or delusional. Thought content does not include homicidal or suicidal ideation. Thought content does not include homicidal or suicidal plan.     ?   Cognition and Memory: Cognition and memory normal.     ?   Judgment: Judgment normal.  ?   Comments: Insight intact ?Mood is less depressed and anxious  ? ? ?Lab Review:  ?   ?Component Value Date/Time  ? NA 140 05/27/2017 1553  ? K 5.0 05/27/2017 1553  ? CL 97 05/27/2017 1553  ? CO2 27 05/27/2017 1553  ? GLUCOSE 81 05/27/2017 1553  ? GLUCOSE 109 (H) 12/20/2015 1915  ? BUN 15 05/27/2017 1553  ? CREATININE 0.66 05/27/2017  1553  ? CALCIUM 10.2 05/27/2017 1553  ? PROT 7.4 05/27/2017 1553  ? ALBUMIN 4.5 05/27/2017 1553  ? AST 26 05/27/2017 1553  ? ALT 20 05/27/2017 1553  ? ALKPHOS 69 05/27/2017 1553  ? BILITOT 0.4 07/17/201

## 2022-02-26 ENCOUNTER — Telehealth: Payer: Self-pay | Admitting: Psychiatry

## 2022-02-26 NOTE — Telephone Encounter (Signed)
Pt notified and understands to stop Lamotrigine 25 mg and call to report in a few days.  ?

## 2022-02-26 NOTE — Telephone Encounter (Signed)
Pt called reporting started Lamotrigine 25 mg  12 days ago. After 2-3 day each day has worsened. Anxiety out the roof. Crying.Feeling helpless.This is just not her. Please contact asap.  # [phone] ?

## 2022-02-26 NOTE — Telephone Encounter (Signed)
Please advise her to stop Lamotrigine and to call back if her mood and anxiety have not improved after a few days.  ?

## 2022-03-01 ENCOUNTER — Telehealth: Payer: Self-pay | Admitting: Psychiatry

## 2022-03-01 DIAGNOSIS — F411 Generalized anxiety disorder: Secondary | ICD-10-CM

## 2022-03-01 DIAGNOSIS — F332 Major depressive disorder, recurrent severe without psychotic features: Secondary | ICD-10-CM

## 2022-03-01 MED ORDER — CLONAZEPAM 0.5 MG PO TABS: ORAL_TABLET | ORAL | 0 refills | Status: DC

## 2022-03-01 MED ORDER — LURASIDONE HCL 40 MG PO TABS: ORAL_TABLET | ORAL | 1 refills | Status: DC

## 2022-03-01 NOTE — Telephone Encounter (Signed)
Please see message from patient

## 2022-03-01 NOTE — Telephone Encounter (Signed)
Returned call to pt and she reports, "I wasn't crying as much."  ? ?Daughter also participates in call. She reports that her mother wakes up and seems to be constantly anxious throughout the day. Daughter reports that pt starts shaking, seems tearful, and fearful of being alone. Daughter reports, "I think she is in her head all the day." Daughter reports that pt has been having intrusive thoughts of someone breaking into the house and harming her. Daughter reports that pt has had some chronic intrusive thoughts and this is more severe compared to the past. Daughter reports that she notices depression s/s.  ? ?She has not been taking Xanax daily.  ? ?Daughter reports that she was originally on Lexapro and Wellbutrin. Daughter had panic with Trintellix. Effexor was ineffective for her. Lorazepam was not effective for her. Klonopin has been effective for her.  ? ?Daughter reports that pt has had lip licking since taking a medication years ago that seemed to cause dry mouth and this "has become a habit." Daughter reports that she notices lip licking more when pt is anxious and less when she is "happy and busy." Daughter denies any safety concerns.  ? ?Plan: ? ?Decrease Latuda to 1/2 tab x 2 evenings, then stop since this could be causing intrusive thoughts.  ?Start Klonopin 0.5 mg 1/2-1 tab po BID for 3-4 days, then BID prn.  ?Patient advised to contact office with any questions, adverse effects, or acute worsening in signs and symptoms. ?Provider will call pt early next week to follow-up.  ? ? ?Past medication trials: ?Celexa ?Pristiq ?Wellbutrin-hallucinations ?Effexor ?Prozac ?Zoloft ?Lexapro ?Nefazodone-effective and well-tolerated ?Cymbalta ?Abilify ?Nuedexta ?Trazodone ?Gabapentin- Disrupted sleep schedules, nightmares.  ?Vistaril ?Nuvigil ?Provigil ?Memantine ?Lunesta- Effective for insomnia ?

## 2022-03-01 NOTE — Telephone Encounter (Signed)
Last visit was 02/14/22. [name] called stating that she is still crying and still gets anxious. She states she is currently staying with her daughter now. Please call her at [phone] ?

## 2022-03-04 ENCOUNTER — Telehealth: Payer: Self-pay | Admitting: Psychiatry

## 2022-03-04 NOTE — Telephone Encounter (Signed)
Called pt to attempt to follow-up from Friday. Left message for pt. Discussed that she could call office to follow-up and/or provider can continue to try to reach her.  ?

## 2022-03-05 NOTE — Telephone Encounter (Signed)
Attempted to reach pt again to follow-up after her call last week. No answer. Left message requesting that pt call office with update on how she is doing and if she would like to discuss other treatment options.  ?

## 2022-03-05 NOTE — Telephone Encounter (Signed)
Attempted to reach pt's home # and message was "we're sorry, your call cannot be completed as dialed."  ?

## 2022-03-06 ENCOUNTER — Telehealth: Payer: Self-pay | Admitting: Psychiatry

## 2022-03-06 DIAGNOSIS — F411 Generalized anxiety disorder: Secondary | ICD-10-CM

## 2022-03-06 DIAGNOSIS — F332 Major depressive disorder, recurrent severe without psychotic features: Secondary | ICD-10-CM

## 2022-03-06 MED ORDER — LORAZEPAM 0.5 MG PO TABS: ORAL_TABLET | ORAL | 0 refills | Status: DC

## 2022-03-06 MED ORDER — BUSPIRONE HCL 15 MG PO TABS: ORAL_TABLET | ORAL | 1 refills | Status: DC

## 2022-03-06 MED ORDER — DULOXETINE HCL 30 MG PO CPEP: ORAL_CAPSULE | ORAL | 0 refills | Status: DC

## 2022-03-06 NOTE — Telephone Encounter (Signed)
Pt called reporting she had left message requesting Jcarter to call her @ [phone] her daughters number. Looks like you had tried to contact yesterday. Please call above # ?

## 2022-03-06 NOTE — Telephone Encounter (Signed)
Returned call to pt's daughter. Daughter reports that pt is "doing better." Daughter reports that she thinks [place] was having a negative effect on her. Daughter reports that she accidentally took [place] again on Monday evening. She reports that pt has had drowsiness with Klonopin, even after reducing to 1/2 tablet. Daughter reports that pt remains anxious. Daughter reports that intrusive thoughts have lessened. Daughter reports that pt has constant worries and anxious thoughts. Daughter reports that pt is wringing her hands and licking her lips. Daughter reports that pt seemed to have improved mood and smiling some and occasionally laughing.  ? ?Plan is for pt to return home on Saturday.  ? ?-Will decrease Cymbalta to 90 mg daily to possibly decrease activation and improve anxiety. Lowering dose may also be helpful if transitioning to  another medication. ?-Will stop Klonopin due to excessive somnolence and start Ativan 0.5 mg 1/2-1 tab po BID prn anxiety.  ?-Discussed potential benefits, risks, and side effects of BuSpar.  Patient agrees to trial of BuSpar.  Will start BuSpar 15 mg 1/3 tablet twice daily for 1 week, then increase to 2/3 tablet twice daily for 1 week, then increase to 1 tablet twice daily for anxiety. ?-Patient advised to contact office with any questions, adverse effects, or acute worsening in signs and symptoms. ?-Will follow-up on Monday, 5/1.  ? ? ?Past medication trials: ?Celexa ?Pristiq ?Wellbutrin-hallucinations ?Effexor ?Prozac ?Zoloft ?Lexapro ?Nefazodone-effective and well-tolerated ?Cymbalta ?Abilify ?Nuedexta ?Trazodone ?Gabapentin- Disrupted sleep schedules, nightmares.  ?Vistaril ?Nuvigil ?Provigil ?Memantine ?Lunesta- Effective for insomnia ?

## 2022-03-11 ENCOUNTER — Encounter: Payer: Self-pay | Admitting: Psychiatry

## 2022-03-11 ENCOUNTER — Ambulatory Visit: Payer: PPO | Admitting: Psychiatry

## 2022-03-11 DIAGNOSIS — F3341 Major depressive disorder, recurrent, in partial remission: Secondary | ICD-10-CM

## 2022-03-11 DIAGNOSIS — F411 Generalized anxiety disorder: Secondary | ICD-10-CM | POA: Diagnosis not present

## 2022-03-11 DIAGNOSIS — F332 Major depressive disorder, recurrent severe without psychotic features: Secondary | ICD-10-CM

## 2022-03-11 MED ORDER — ARIPIPRAZOLE 5 MG PO TABS: 5.0000 mg | ORAL_TABLET | Freq: Every day | ORAL | 1 refills | Status: DC

## 2022-03-11 MED ORDER — LORAZEPAM 0.5 MG PO TABS: ORAL_TABLET | ORAL | 1 refills | Status: DC

## 2022-03-11 MED ORDER — DULOXETINE HCL 30 MG PO CPEP: ORAL_CAPSULE | ORAL | 0 refills | Status: DC

## 2022-03-11 NOTE — Progress Notes (Signed)
Jerrye Bushy Barhorst ?[id number] ?06/11/1946 ?76 y.o. ? ?Subjective:  ? ?Patient ID:  [name] is a 76 y.o. (DOB 12-29-1945) female. ? ?Chief Complaint:  ?Chief Complaint  ?Patient presents with  ? Anxiety  ? Depression  ? ? ?HPI ?[name] presents to the office today for follow-up of anxiety and depression. She is accompanied by her husband and daughter joins via speakerphone. She reports feeling "shaky" and this started a few days. She reports feeling "uneasy...like I am not capable." She denies panic attacks. She reports that she continues to have intrusive thoughts about someone breaking in her home and also about people in her family being hurt. She reports that she has frequent worry.  Daughter reports that pt's hand has been shaking a lot and she has been crying frequently. Pt reports that she is not "more anxious" since med changes. Pt and daughter both report that Ativan seems to be helpful for anxiety. Persistent depressed mood. Rates mood as an 8/10 with 10= the most depressed mood imaginable. She reports mood had been a 10/10. Denies irritability. She reports that her energy is low. Motivation has been low and has been pushing herself to do things she knows she needs to do. Has been less hungry. Concentration has been poor and has not been able to get into a book. Sleeping well. Denies SI.  ? ?Friend came to visit yesterday. Has been talking to friends more on the phone. Rode with husband to the grocery store.  ? ?Grits teeth and clinches them. Wringing hands. Lip licking has improved. ? ?Taking Ativan 1-2 times daily. Typically needs Ativan first thing in the morning. Denies feeling restless.  ? ?Missed Modafinil for about a week. Notices she has a little more energy and motivation when she takes Modafinil.  ? ?Past medication trials: ?Celexa ?Pristiq ?Wellbutrin-hallucinations ?Effexor ?Prozac ?Zoloft ?Lexapro ?Nefazodone-effective and well-tolerated ?Cymbalta ?Abilify ?Latuda-Adverse reaction.  Worsening depression ?Lamotrigine-Worsening mood and anxiety ?Nuedexta ?Trazodone ?Gabapentin- Disrupted sleep schedules, nightmares.  ?Vistaril ?Nuvigil ?Provigil ?Memantine ?Lunesta- Effective for insomnia ? ? ?AIMS   ? ?[place] Visit from 07/24/2021 in [place] Visit from 03/20/2021 in [place] Visit from 10/10/2020 in [place] Visit from 05/23/2020 in [place] Visit from 05/26/2019 in [place]  ?AIMS Total Score 0 0 0 0 1  ? ?  ? ?GAD-7   ? ?[place] Office Visit from 01/30/2021 in [place]  ?Total GAD-7 Score 1  ? ?  ? ?PHQ2-9   ? ?[place] Visit from 01/30/2021 in [facility] Office Visit from 05/11/2019 in [place] [facility]/27/2015 in [facility]  ?PHQ-2 Total Score 2 1 0 0  ?PHQ-9 Total Score 6 -- -- --  ? ?  ?  ? ?Review of Systems:  ?Review of Systems  ?Musculoskeletal:  Negative for gait problem.  ?Neurological:  Positive for tremors.  ?Psychiatric/Behavioral:    ?     Please refer to HPI  ? ?Medications: I have reviewed the patient's current medications. ? ?Current Outpatient Medications  ?Medication Sig Dispense Refill  ? busPIRone (BUSPAR) 15 MG tablet Take 1/3 tablet p.o. twice daily for 1 week, then take 2/3 tablet p.o. twice daily for 1 week, then take 1 tablet p.o. twice daily 60 tablet 1  ? DULoxetine (CYMBALTA) 60 MG capsule Take 2  capsules (120 mg total) by mouth at bedtime. 180 capsule 1  ? eszopiclone (LUNESTA) 2 MG TABS tablet TAKE (1) TABLET BY MOUTH AT BEDTIME. 30 tablet 5  ? modafinil (PROVIGIL) 200 MG tablet Take 1 tab po q am and 1/2 tab po qd prn 45 tablet 4  ? MYRBETRIQ 50 MG TB24 tablet Take 50 mg by mouth daily.    ? ARIPiprazole (ABILIFY) 5 MG tablet Take 1 tablet (5 mg total) by mouth  daily. 30 tablet 1  ? cholecalciferol (VITAMIN D3) 25 MCG (1000 UNIT) tablet Take 3,000 Units by mouth daily. (Patient not taking: Reported on 03/11/2022)    ? DULoxetine (CYMBALTA) 30 MG capsule Take 1 capsule daily with a 60 mg capsule to equal total dose of 90 mg. 30 capsule 0  ? [START ON 03/18/2022] LORazepam (ATIVAN) 0.5 MG tablet Take 1/2-1 tab twice daily as needed 60 tablet 1  ? Multiple Vitamin (MULTIVITAMIN) tablet Take 1 tablet by mouth daily. (Patient not taking: Reported on 07/24/2021)    ? SUMAtriptan (IMITREX) 100 MG tablet Take 100 mg by mouth every 2 (two) hours as needed for migraine or headache. May repeat in 2 hours if headache persists or recurs.    ? tiZANidine (ZANAFLEX) 2 MG tablet Take by mouth every 6 (six) hours as needed for muscle spasms. (Patient not taking: Reported on 02/14/2022)    ? ?No current facility-administered medications for this visit.  ? ? ?Medication Side Effects: None ? ?Allergies:  ?Allergies  ?Allergen Reactions  ? Ticlid [Ticlopidine] Shortness Of Breath  ? Nsaids Other (See Comments)  ?  Bleeding ulcer  ? Prednisone Other (See Comments)  ?  Makes her stomach hurt--knows this isn't an allergy but doesn't want to take it ?  ? Monistat [Miconazole] Swelling and Rash  ? Sulfa Antibiotics Rash  ? ? ?Past Medical History:  ?Diagnosis Date  ? Arthritis   ? Depression   ? H/O head injury   ? Hearing loss   ? History of stomach ulcers   ? Migraine   ? Stroke [facility])   ? ? ?Past Medical History, Surgical history, Social history, and Family history were reviewed and updated as appropriate.  ? ?Please see review of systems for further details on the patient's review from today.  ? ?Objective:  ? ?Physical Exam:  ?There were no vitals taken for this visit. ? ?Physical Exam ?Constitutional:   ?   General: She is not in acute distress. ?Musculoskeletal:     ?   General: No deformity.  ?Neurological:  ?   Mental Status: She is alert and oriented to person, place, and time.  ?   Coordination:  Coordination normal.  ?Psychiatric:     ?   Attention and Perception: Attention and perception normal. She does not perceive auditory or visual hallucinations.     ?   Mood and Affect: Mood is anxious and depressed. Affect is not labile, blunt, angry or inappropriate.     ?   Speech: Speech normal.     ?   Behavior: Behavior normal.     ?   Thought Content: Thought content normal. Thought content is not paranoid or delusional. Thought content does not include homicidal or suicidal ideation. Thought content does not include homicidal or suicidal plan.     ?   Cognition and Memory: Cognition and memory normal.     ?   Judgment: Judgment normal.  ?   Comments: Insight intact ?Wringing hands and  shaking on exam  ? ? ?Lab Review:  ?   ?Component Value Date/Time  ? NA 140 05/27/2017 1553  ? K 5.0 05/27/2017 1553  ? CL 97 05/27/2017 1553  ? CO2 27 05/27/2017 1553  ? GLUCOSE 81 05/27/2017 1553  ? GLUCOSE 109 (H) 12/20/2015 1915  ? BUN 15 05/27/2017 1553  ? CREATININE 0.66 05/27/2017 1553  ? CALCIUM 10.2 05/27/2017 1553  ? PROT 7.4 05/27/2017 1553  ? ALBUMIN 4.5 05/27/2017 1553  ? AST 26 05/27/2017 1553  ? ALT 20 05/27/2017 1553  ? ALKPHOS 69 05/27/2017 1553  ? BILITOT 0.4 05/27/2017 1553  ? GFRNONAA 90 05/27/2017 1553  ? GFRAA 104 05/27/2017 1553  ? ? ?   ?Component Value Date/Time  ? WBC 14.1 (H) 12/20/2015 1905  ? RBC 4.24 12/20/2015 1905  ? HGB 14.6 12/20/2015 1915  ? HCT 43.0 12/20/2015 1915  ? PLT 253 12/20/2015 1905  ? MCV 96.2 12/20/2015 1905  ? MCH 30.7 12/20/2015 1905  ? MCHC 31.9 12/20/2015 1905  ? RDW 13.4 12/20/2015 1905  ? ? ?No results found for: POCLITH, LITHIUM  ? ?No results found for: PHENYTOIN, PHENOBARB, VALPROATE, CBMZ  ? ?.res ?Assessment: Plan:   ?Pt seen for 30 minutes and time spent discussing possible treatment options for anxiety and depression with pt, her husband, and daughter (via phone). Discussed that more time is needed to determine response to Buspar. Reviewed response to recent medication  changes and discussed treatment plan options to include changing Cymbalta to an alternative medication or augmentation strategies, such as adding Rexulti or Abilify. They report that a generic medication wou

## 2022-03-14 DIAGNOSIS — N3946 Mixed incontinence: Secondary | ICD-10-CM | POA: Diagnosis not present

## 2022-03-14 DIAGNOSIS — R350 Frequency of micturition: Secondary | ICD-10-CM | POA: Diagnosis not present

## 2022-03-19 ENCOUNTER — Telehealth: Payer: Self-pay | Admitting: Psychiatry

## 2022-03-19 NOTE — Telephone Encounter (Signed)
Noted  

## 2022-03-19 NOTE — Telephone Encounter (Signed)
See message f rom patient

## 2022-03-19 NOTE — Telephone Encounter (Signed)
Pt called at 11:40 am to tell Janett Billow that she is feeling much better ?

## 2022-03-25 ENCOUNTER — Telehealth: Payer: Self-pay | Admitting: Psychiatry

## 2022-03-25 ENCOUNTER — Other Ambulatory Visit (HOSPITAL_COMMUNITY): Payer: Self-pay | Admitting: Adult Health

## 2022-03-25 DIAGNOSIS — Z1231 Encounter for screening mammogram for malignant neoplasm of breast: Secondary | ICD-10-CM

## 2022-03-25 NOTE — Telephone Encounter (Signed)
See message from patient. I asked her what was going on since she had called a few days ago to say she was doing better. She said she feels like she is going backwards and "nothing is right." She is able to get to sleep with medication but doesn't stay asleep and if she gets up to the RR it takes her a while to get back to sleep. Appetite is fine. She isn't taking Ativan or Xanax. When I asked her why she said that she thinks her anxiety might not be as bad as she thinks it is and she doesn't want to take all her medications. Denies SI.  ?

## 2022-03-25 NOTE — Telephone Encounter (Signed)
Returned call to pt. She reports that she is "up and down." She reports periods of depression and anxiety. She reports that her sleep has been disrupted. She reports that she has not been taking Lorazepam over the last week. "I felt better last week. I felt like I was making some progress." She reports that she increased Buspar to 1 tab BID over the weekend. She reports anxiety is now episodic and is trying not to take Lorazepam when it occurs.  ? ?Recommended that she re-start Lorazepam prn since she may be experiencing some mild benzodiazepine withdrawal after taking 0.5 mg twice daily to stopping it abruptly. She reports that her mood and anxiety were improved early last week and symptoms have worsened over the last week when she abruptly stopped Lorazepam prn. Discussed benzodiazepine withdrawal could also cause increased anxiety and insomnia. Advised pt to take a Lorazepam at time of call and to call back it s/s do not improve. Discussed that Buspar will take additional time to become effective.  ?

## 2022-03-25 NOTE — Telephone Encounter (Signed)
Pt lvm at 3:55 pm and said that she has not been doing very well the last few days. She wants to talk to [place].Please give her a call at [phone] ?

## 2022-03-27 DIAGNOSIS — M25562 Pain in left knee: Secondary | ICD-10-CM | POA: Diagnosis not present

## 2022-03-27 DIAGNOSIS — M25561 Pain in right knee: Secondary | ICD-10-CM | POA: Diagnosis not present

## 2022-03-28 ENCOUNTER — Other Ambulatory Visit (HOSPITAL_COMMUNITY): Payer: Self-pay | Admitting: Adult Health

## 2022-03-28 ENCOUNTER — Inpatient Hospital Stay
Admission: RE | Admit: 2022-03-28 | Discharge: 2022-03-28 | Disposition: A | Discharge status: Home / Self Care | Payer: Self-pay | Source: Ambulatory Visit | Attending: Adult Health | Admitting: Adult Health

## 2022-03-28 DIAGNOSIS — Z1231 Encounter for screening mammogram for malignant neoplasm of breast: Secondary | ICD-10-CM

## 2022-03-29 ENCOUNTER — Inpatient Hospital Stay
Admission: RE | Admit: 2022-03-29 | Discharge: 2022-03-29 | Disposition: A | Discharge status: Home / Self Care | Payer: Self-pay | Source: Ambulatory Visit | Attending: Adult Health | Admitting: Adult Health

## 2022-03-29 ENCOUNTER — Other Ambulatory Visit (HOSPITAL_COMMUNITY): Payer: Self-pay | Admitting: Adult Health

## 2022-03-29 ENCOUNTER — Ambulatory Visit
Admission: RE | Admit: 2022-03-29 | Discharge: 2022-03-29 | Disposition: A | Discharge status: Home / Self Care | Payer: Self-pay | Source: Ambulatory Visit | Attending: Adult Health | Admitting: Adult Health

## 2022-03-29 DIAGNOSIS — Z1231 Encounter for screening mammogram for malignant neoplasm of breast: Secondary | ICD-10-CM

## 2022-04-01 ENCOUNTER — Ambulatory Visit (HOSPITAL_COMMUNITY)
Admission: RE | Admit: 2022-04-01 | Discharge: 2022-04-01 | Disposition: A | Discharge status: Home / Self Care | Payer: PPO | Source: Ambulatory Visit | Attending: Adult Health | Admitting: Adult Health

## 2022-04-01 DIAGNOSIS — Z1231 Encounter for screening mammogram for malignant neoplasm of breast: Secondary | ICD-10-CM | POA: Diagnosis not present

## 2022-04-03 ENCOUNTER — Ambulatory Visit (INDEPENDENT_AMBULATORY_CARE_PROVIDER_SITE_OTHER): Payer: PPO | Admitting: Psychiatry

## 2022-04-03 ENCOUNTER — Encounter: Payer: Self-pay | Admitting: Psychiatry

## 2022-04-03 DIAGNOSIS — F332 Major depressive disorder, recurrent severe without psychotic features: Secondary | ICD-10-CM | POA: Diagnosis not present

## 2022-04-03 DIAGNOSIS — F411 Generalized anxiety disorder: Secondary | ICD-10-CM | POA: Diagnosis not present

## 2022-04-03 MED ORDER — BUSPIRONE HCL 15 MG PO TABS: 15.0000 mg | ORAL_TABLET | Freq: Two times a day (BID) | ORAL | 1 refills | Status: DC

## 2022-04-03 MED ORDER — DULOXETINE HCL 60 MG PO CPEP: 60.0000 mg | ORAL_CAPSULE | Freq: Two times a day (BID) | ORAL | 1 refills | Status: DC

## 2022-04-03 NOTE — Progress Notes (Signed)
[name] [id number] 23-Nov-1945 76 y.o.  Subjective:   Patient ID:  [name] is a 76 y.o. (DOB Nov 06, 1946) female.  Chief Complaint:  Chief Complaint  Patient presents with  . Depression  . Anxiety    HPI [name] presents to the office today for follow-up of depression and anxiety. She is accompanied by her husband. Daughter joins via speaker phone. Patient reports feeling "blah." Reports sad mood. Energy and motivation is low. She has been doing some chores. Diminished interest and enjoyment in things. She reports poor concentration and recently had difficulty with a password. She has been unable to read and comprehend what she read. Sleeping well. No change in appetite. Denies SI.   Daughter notices that pt is forgetting things more. Pt and her husband agree with this. She reports that she recently could not answer questions during a visit to the bank, such as what her password had been, when she had last used it, etc.   She reports that her anxiety has been "really high." She reports worry and restlessness. Describes rumination and will wring her hands. Continued intrusive thoughts. She reports that Ativan helps her anxiety. She reports taking Ativan every other day. Denies any recent panic attacks.   Husband reports that she has been driving some indepenently. She has shopped some. She was able to be at home by herself while husband played  golf in the middle of the day.   Husband reports that she is no longer crying excessively.   Lifelong friend died yesterday.  She has someone to stay with her 2 nights a week. She is working on coverage for the other nights  She notices some lip licking and clicking her teeth.   Daughter reports that she noticed her mother seemed to have some initial improvement and then has not seen any further improvement.   Past medication  trials: Celexa Pristiq Wellbutrin-hallucinations Effexor Prozac Zoloft Lexapro Nefazodone-effective and well-tolerated Cymbalta Abilify Latuda-Adverse reaction. Worsening depression Lamotrigine-Worsening mood and anxiety Nuedexta Trazodone Gabapentin- Disrupted sleep schedules, nightmares.  Vistaril Nuvigil Provigil Memantine Lunesta- Effective for insomnia   AIMS    Flowsheet Row Office Visit from 07/24/2021 in [place] Office Visit from 03/20/2021 in [place] Office Visit from 10/10/2020 in [place] Visit from 05/23/2020 in [place] Visit from 05/26/2019 in [place] Total Score 0 0 0 0 1      [place] Office Visit from 01/30/2021 in [place]  Total GAD-7 Score 1      [place] Visit from 04/03/2022 in [facility]  Total Score (max 30 points ) 30      PHQ2-9    [place] Visit from 01/30/2021 in [place] Visit from 05/11/2019 in [place] [facility] Nutrition [facility] 02/27/2015 in [facility]  PHQ-2 Total Score 2 1 0 0  PHQ-9 Total Score 6 -- -- --        Review of Systems:  Review of Systems  Musculoskeletal:  Negative for gait problem.  Neurological:  Negative for tremors.  Psychiatric/Behavioral:         Please refer to HPI   Medications: I have reviewed the patient's current medications.  Current Outpatient Medications  Medication Sig Dispense Refill  . ARIPiprazole (ABILIFY) 5 MG  tablet Take 1 tablet (5 mg total) by mouth daily. 30 tablet 1  . cholecalciferol (VITAMIN D3) 25 MCG (1000 UNIT) tablet Take 3,000 Units by mouth daily.    . eszopiclone (LUNESTA) 2 MG TABS tablet TAKE (1) TABLET BY MOUTH AT BEDTIME. 30 tablet 5  . LORazepam  (ATIVAN) 0.5 MG tablet Take 1/2-1 tab twice daily as needed 60 tablet 1  . modafinil (PROVIGIL) 200 MG tablet Take 1 tab po q am and 1/2 tab po qd prn 45 tablet 4  . Multiple Vitamin (MULTIVITAMIN) tablet Take 1 tablet by mouth daily.    Marland Kitchen MYRBETRIQ 50 MG TB24 tablet Take 50 mg by mouth daily.    . busPIRone (BUSPAR) 15 MG tablet Take 1 tablet (15 mg total) by mouth 2 (two) times daily. 60 tablet 1  . DULoxetine (CYMBALTA) 60 MG capsule Take 1 capsule (60 mg total) by mouth 2 (two) times daily. 180 capsule 1  . SUMAtriptan (IMITREX) 100 MG tablet Take 100 mg by mouth every 2 (two) hours as needed for migraine or headache. May repeat in 2 hours if headache persists or recurs.    Marland Kitchen tiZANidine (ZANAFLEX) 2 MG tablet Take by mouth every 6 (six) hours as needed for muscle spasms. (Patient not taking: Reported on 02/14/2022)     No current facility-administered medications for this visit.    Medication Side Effects: None  Allergies:  Allergies  Allergen Reactions  . Ticlid [Ticlopidine] Shortness Of Breath  . Nsaids Other (See Comments)    Bleeding ulcer  . Prednisone Other (See Comments)    Makes her stomach hurt--knows this isn't an allergy but doesn't want to take it   . Monistat [Miconazole] Swelling and Rash  . Sulfa Antibiotics Rash    Past Medical History:  Diagnosis Date  . Arthritis   . Depression   . H/O head injury   . Hearing loss   . History of stomach ulcers   . Migraine   . Stroke [facility])     Past Medical History, Surgical history, Social history, and Family history were reviewed and updated as appropriate.   Please see review of systems for further details on the patient's review from today.   Objective:   Physical Exam:  There were no vitals taken for this visit.  Physical Exam Constitutional:      General: She is not in acute distress. Musculoskeletal:        General: No deformity.  Neurological:     Mental Status: She is alert and oriented to person,  place, and time.     Coordination: Coordination normal.  Psychiatric:        Attention and Perception: Attention and perception normal. She does not perceive auditory or visual hallucinations.        Mood and Affect: Mood is anxious and depressed. Affect is not labile, blunt, angry or inappropriate.        Speech: Speech normal.        Behavior: Behavior normal.        Thought Content: Thought content normal. Thought content is not paranoid or delusional. Thought content does not include homicidal or suicidal ideation. Thought content does not include homicidal or suicidal plan.        Cognition and Memory: Cognition and memory normal.        Judgment: Judgment normal.     Comments: Insight intact Presents as less anxious compared to last exam (less restless, no longer shaking, wringing hands less)  Lab Review:     Component Value Date/Time   NA 140 05/27/2017 1553   K 5.0 05/27/2017 1553   CL 97 05/27/2017 1553   CO2 27 05/27/2017 1553   GLUCOSE 81 05/27/2017 1553   GLUCOSE 109 (H) 12/20/2015 1915   BUN 15 05/27/2017 1553   CREATININE 0.66 05/27/2017 1553   CALCIUM 10.2 05/27/2017 1553   PROT 7.4 05/27/2017 1553   ALBUMIN 4.5 05/27/2017 1553   AST 26 05/27/2017 1553   ALT 20 05/27/2017 1553   ALKPHOS 69 05/27/2017 1553   BILITOT 0.4 05/27/2017 1553   GFRNONAA 90 05/27/2017 1553   GFRAA 104 05/27/2017 1553       Component Value Date/Time   WBC 14.1 (H) 12/20/2015 1905   RBC 4.24 12/20/2015 1905   HGB 14.6 12/20/2015 1915   HCT 43.0 12/20/2015 1915   PLT 253 12/20/2015 1905   MCV 96.2 12/20/2015 1905   MCH 30.7 12/20/2015 1905   MCHC 31.9 12/20/2015 1905   RDW 13.4 12/20/2015 1905    No results found for: POCLITH, LITHIUM   No results found for: PHENYTOIN, PHENOBARB, VALPROATE, CBMZ   .res Assessment: Plan:   Pt seen for 45 minutes and time spent discussing plan with pt and family, administering MMSE, and discussing referral to [facility]. Pt, pt's  husband, and pt's daughter report that she has been having increased difficulty with memory and are requesting re-evaluation. She was seen on 01/01/18 at the [place] clinic. Will make referral to this memory disorders clinic.  Previously seen by Suanne Marker, MD.   [facility] [place] [address] [place] [address] [place] [address] [phone]   Therapy with Jackelyn Poling.    [name] was seen today for depression and anxiety.  Diagnoses and all orders for this visit:  Severe episode of recurrent major depressive disorder, without psychotic features ([place]) -     DULoxetine (CYMBALTA) 60 MG capsule; Take 1 capsule (60 mg total) by mouth 2 (two) times daily.  Anxiety state -     busPIRone (BUSPAR) 15 MG tablet; Take 1 tablet (15 mg total) by mouth 2 (two) times daily.     Please see After Visit Summary for patient specific instructions.  Future Appointments  Date Time Provider [place]  05/02/2022 10:30 AM Thayer Headings, PMHNP CP-CP None    No orders of the defined types were placed in this encounter.   -------------------------------

## 2022-04-15 ENCOUNTER — Telehealth: Payer: Self-pay | Admitting: Psychiatry

## 2022-04-15 NOTE — Telephone Encounter (Signed)
Referral has been sent to [facility]

## 2022-05-02 ENCOUNTER — Telehealth: Payer: Self-pay | Admitting: Psychiatry

## 2022-05-02 ENCOUNTER — Ambulatory Visit: Payer: PPO | Admitting: Psychiatry

## 2022-05-02 ENCOUNTER — Encounter: Payer: Self-pay | Admitting: Psychiatry

## 2022-05-02 DIAGNOSIS — G2401 Drug induced subacute dyskinesia: Secondary | ICD-10-CM | POA: Diagnosis not present

## 2022-05-02 DIAGNOSIS — F332 Major depressive disorder, recurrent severe without psychotic features: Secondary | ICD-10-CM | POA: Diagnosis not present

## 2022-05-02 DIAGNOSIS — F411 Generalized anxiety disorder: Secondary | ICD-10-CM | POA: Diagnosis not present

## 2022-05-02 DIAGNOSIS — F482 Pseudobulbar affect: Secondary | ICD-10-CM | POA: Diagnosis not present

## 2022-05-02 DIAGNOSIS — F3341 Major depressive disorder, recurrent, in partial remission: Secondary | ICD-10-CM

## 2022-05-02 MED ORDER — ARIPIPRAZOLE 5 MG PO TABS: 5.0000 mg | ORAL_TABLET | Freq: Every day | ORAL | 1 refills | Status: DC

## 2022-05-02 MED ORDER — BUSPIRONE HCL 15 MG PO TABS: ORAL_TABLET | ORAL | 1 refills | Status: DC

## 2022-05-02 MED ORDER — VALBENAZINE TOSYLATE 40 MG PO CAPS
40.0000 mg | ORAL_CAPSULE | Freq: Every day | ORAL | 0 refills | Status: DC
Start: 2022-05-02 — End: 2022-06-18

## 2022-05-02 MED ORDER — VALBENAZINE TOSYLATE 60 MG PO CAPS: 60.0000 mg | ORAL_CAPSULE | Freq: Every day | ORAL | 1 refills | Status: DC

## 2022-05-02 MED ORDER — MODAFINIL 200 MG PO TABS: ORAL_TABLET | ORAL | 4 refills | Status: DC

## 2022-05-02 MED ORDER — VALBENAZINE TOSYLATE 60 MG PO CAPS: 60.0000 mg | ORAL_CAPSULE | Freq: Every day | ORAL | 0 refills | Status: DC

## 2022-05-02 MED ORDER — BUSPIRONE HCL 30 MG PO TABS: 30.0000 mg | ORAL_TABLET | Freq: Two times a day (BID) | ORAL | 1 refills | Status: DC

## 2022-05-02 NOTE — Patient Instructions (Signed)
Take Buspar 15 mg 1.5 tablets twice daily for one week, then increase to 30 mg BID. I have sent a prescription for the 30 mg tablets to your pharmacy and then you could take one 30 mg tablet twice daily.

## 2022-05-02 NOTE — Progress Notes (Signed)
[name] [id number] [birth year] 76 y.o.  Subjective:   Patient ID:  [name] is a 76 y.o. (DOB 1946-03-11) female.  Chief Complaint:  Chief Complaint  Patient presents with   Anxiety   Depression    Anxiety    Depression        Past medical history includes anxiety.    [name] presents to the office today for follow-up of depression, anxiety, and insomnia. She is accompanied by her husband and daughter joins by speaker phone. She reports that her anxiety is improved since dose of Cymbalta was increased. She reports that she is feeling anxious at times. She had panic when someone was not early. Denies any other episodes of panic. Husband reports that he thinks there has been some improvement in her symptoms. She has been driving and going shopping alone. She reports that she has episodic anxiety in response to a stressor. She reports that she has anxiety when things do not go according to plan or when things are not certain. She reports that she continues to have frequent worry. Continues to have intrusive thoughts. She reports that she has had some irritability. She reports that she continues to have persistent depression. Denies excessive crying. She is now reading books and read 3 books in one week. She reports that concentration is improving "but not as good as it used to be." Energy and motivation remain low. Enjoying reading. Appetite has been good. She reports that she is not sleeping well. Waking up around 3 am and sometimes takes awhile to return to sleep. She reports that she feels anxious with middle of the night awakenings and is concerned she will not be able to return to sleep. Not interested in shopping and this is not like her. She worked in the yard one day and states this was helpful. Has not painted recently. Denies SI.   Someone is coming to stay with her on the evenings. She reports that her anxiety is better when someone else is around.   She will be seen  at [facility] in July. She has an apt to start therapy with Luan Moore, PhD.   She has not used Ativan prn often.  Past medication trials: Celexa Pristiq Wellbutrin-hallucinations Effexor Prozac Zoloft Lexapro Nefazodone-effective and well-tolerated Cymbalta Abilify Latuda-Adverse reaction. Worsening depression Lamotrigine-Worsening mood and anxiety Nuedexta Trazodone Gabapentin- Disrupted sleep schedules, nightmares.  Vistaril Nuvigil Provigil Memantine Lunesta- Effective for insomnia  AIMS    Flowsheet Row Office Visit from 05/02/2022 in [place] Office Visit from 07/24/2021 in [place] Office Visit from 03/20/2021 in [place] Visit from 10/10/2020 in [place] Visit from 05/23/2020 in [place] Total Score 11 0 0 0 0      [place] Office Visit from 01/30/2021 in [place]  Total GAD-7 Score 1      [place] Visit from 04/03/2022 in [place]  Total Score (max 30 points ) 30      PHQ2-9    [place] Visit from 01/30/2021 in [place] Visit from 05/11/2019 in [place] Nutrition from [facility] Nutrition from 02/27/2015 in [facility]  PHQ-2 Total Score 2 1 0 0  PHQ-9 Total Score 6 -- -- --  Review of Systems:  Review of Systems  Musculoskeletal:  Negative for gait problem.       Knee pain  Neurological:  Negative for tremors.  Psychiatric/Behavioral:  Positive for depression.        Please refer to HPI    Medications: I have reviewed the patient's current medications.  Current Outpatient Medications  Medication Sig Dispense Refill   busPIRone (BUSPAR) 30 MG tablet Take 1 tablet (30 mg total) by mouth 2 (two) times daily. 60  tablet 1   cholecalciferol (VITAMIN D3) 25 MCG (1000 UNIT) tablet Take 3,000 Units by mouth daily.     DULoxetine (CYMBALTA) 60 MG capsule Take 1 capsule (60 mg total) by mouth 2 (two) times daily. 180 capsule 1   eszopiclone (LUNESTA) 2 MG TABS tablet TAKE (1) TABLET BY MOUTH AT BEDTIME. 30 tablet 5   LORazepam (ATIVAN) 0.5 MG tablet Take 1/2-1 tab twice daily as needed 60 tablet 1   Multiple Vitamin (MULTIVITAMIN) tablet Take 1 tablet by mouth daily.     MYRBETRIQ 50 MG TB24 tablet Take 50 mg by mouth daily.     SUMAtriptan (IMITREX) 100 MG tablet Take 100 mg by mouth every 2 (two) hours as needed for migraine or headache. May repeat in 2 hours if headache persists or recurs.     valbenazine (INGREZZA) 40 MG capsule Take 1 capsule (40 mg total) by mouth at bedtime. Then increase to 60 mg at bedtime. 7 capsule 0   valbenazine (INGREZZA) 60 MG capsule Take 1 capsule (60 mg total) by mouth daily. 14 capsule 0   valbenazine (INGREZZA) 60 MG capsule Take 1 capsule (60 mg total) by mouth daily. 30 capsule 1   ARIPiprazole (ABILIFY) 5 MG tablet Take 1 tablet (5 mg total) by mouth daily. 30 tablet 1   busPIRone (BUSPAR) 15 MG tablet Take 1.5 tabs BID for one week, then increase to 30 mg BID 60 tablet 1   [START ON 05/22/2022] modafinil (PROVIGIL) 200 MG tablet Take 1 tab po q am and 1/2 tab po qd prn 45 tablet 4   tiZANidine (ZANAFLEX) 2 MG tablet Take by mouth every 6 (six) hours as needed for muscle spasms. (Patient not taking: Reported on 02/14/2022)     No current facility-administered medications for this visit.    Medication Side Effects: None Some lip licking (got worse 2 weeks ago and then improved)  Allergies:  Allergies  Allergen Reactions   Ticlid [Ticlopidine] Shortness Of Breath   Nsaids Other (See Comments)    Bleeding ulcer   Prednisone Other (See Comments)    Makes her stomach hurt--knows this isn't an allergy but doesn't want to take it    Monistat [Miconazole] Swelling and Rash    Sulfa Antibiotics Rash    Past Medical History:  Diagnosis Date   Arthritis    Depression    H/O head injury    Hearing loss    History of stomach ulcers    Migraine    Stroke [facility])     Past Medical History, Surgical history, Social history, and Family history were reviewed and updated as appropriate.   Please see review of systems for further details on the patient's review from today.   Objective:   Physical Exam:  There were no vitals taken for this visit.  Physical Exam Constitutional:      General: She is not in acute distress. Musculoskeletal:        General: No deformity.  Neurological:  Mental Status: She is alert and oriented to person, place, and time.     Coordination: Coordination normal.  Psychiatric:        Attention and Perception: Attention and perception normal. She does not perceive auditory or visual hallucinations.        Mood and Affect: Mood normal. Mood is not anxious or depressed. Affect is not labile, blunt, angry or inappropriate.        Speech: Speech normal.        Behavior: Behavior normal.        Thought Content: Thought content normal. Thought content is not paranoid or delusional. Thought content does not include homicidal or suicidal ideation. Thought content does not include homicidal or suicidal plan.        Cognition and Memory: Cognition and memory normal.        Judgment: Judgment normal.     Comments: Insight intact     Lab Review:     Component Value Date/Time   NA 140 05/27/2017 1553   K 5.0 05/27/2017 1553   CL 97 05/27/2017 1553   CO2 27 05/27/2017 1553   GLUCOSE 81 05/27/2017 1553   GLUCOSE 109 (H) 12/20/2015 1915   BUN 15 05/27/2017 1553   CREATININE 0.66 05/27/2017 1553   CALCIUM 10.2 05/27/2017 1553   PROT 7.4 05/27/2017 1553   ALBUMIN 4.5 05/27/2017 1553   AST 26 05/27/2017 1553   ALT 20 05/27/2017 1553   ALKPHOS 69 05/27/2017 1553   BILITOT 0.4 05/27/2017 1553   GFRNONAA 90 05/27/2017 1553   GFRAA 104  05/27/2017 1553       Component Value Date/Time   WBC 14.1 (H) 12/20/2015 1905   RBC 4.24 12/20/2015 1905   HGB 14.6 12/20/2015 1915   HCT 43.0 12/20/2015 1915   PLT 253 12/20/2015 1905   MCV 96.2 12/20/2015 1905   MCH 30.7 12/20/2015 1905   MCHC 31.9 12/20/2015 1905   RDW 13.4 12/20/2015 1905    No results found for: "POCLITH", "LITHIUM"   No results found for: "PHENYTOIN", "PHENOBARB", "VALPROATE", "CBMZ"   .res Assessment: Plan:   Pt seen for 30 minutes and time spent discussing potential benefits, risks, and side effects of increasing Buspar. Pt agrees to increase in Buspar. Will increase Buspar to 15 mg 1.5 tabs po BID for one week, then increase to 30 mg BID for anxiety.  Discussed potential benefits, risks, and side effects of Ingrezza for TD. Will start Ingrezza 40 mg po QHS for one week, then increase to 60 mg po QHS. Pt provided with samples and script sent to [facility] for 60 mg po QHS.  Continue Abilify 5 mg po qd for depression.  Continue Cymbalta 60 mg po BID for anxiety and depression.  Continue Lunesta 2 mg po QHS for insomnia.  Continue Modafinil 200 mg in the morning and 1/2 tab as needed.  Continue Lorazepam 0.5 mg 1/2-1 tab po BID prn anxiety.  Pt to follow-up with this provider in 4-6 weeks or sooner if clinically indicated.  Patient advised to contact office with any questions, adverse effects, or acute worsening in signs and symptoms.   [place] was seen today for anxiety and depression.  Diagnoses and all orders for this visit:  Anxiety state -     busPIRone (BUSPAR) 15 MG tablet; Take 1.5 tabs BID for one week, then increase to 30 mg BID -     busPIRone (BUSPAR) 30 MG tablet; Take 1 tablet (30 mg total) by  mouth 2 (two) times daily.  Tardive dyskinesia -     valbenazine (INGREZZA) 40 MG capsule; Take 1 capsule (40 mg total) by mouth at bedtime. Then increase to 60 mg at bedtime. -     valbenazine (INGREZZA) 60 MG capsule; Take 1 capsule (60 mg  total) by mouth daily. -     valbenazine (INGREZZA) 60 MG capsule; Take 1 capsule (60 mg total) by mouth daily.  Recurrent major depressive disorder, in partial remission (HCC) -     ARIPiprazole (ABILIFY) 5 MG tablet; Take 1 tablet (5 mg total) by mouth daily.  Severe episode of recurrent major depressive disorder, without psychotic features ([place]) -     modafinil (PROVIGIL) 200 MG tablet; Take 1 tab po q am and 1/2 tab po qd prn  PBA (pseudobulbar affect) -     modafinil (PROVIGIL) 200 MG tablet; Take 1 tab po q am and 1/2 tab po qd prn     Please see After Visit Summary for patient specific instructions.  Future Appointments  Date Time Provider [place]  05/28/2022 10:00 AM Blanchie Serve, PhD CP-CP None  06/06/2022 10:30 AM Thayer Headings, PMHNP CP-CP None    No orders of the defined types were placed in this encounter.   -------------------------------

## 2022-05-02 NOTE — Telephone Encounter (Signed)
Pt called. Asking for return call about med change today. Needs help understanding.Contact @ [phone].

## 2022-05-02 NOTE — Telephone Encounter (Signed)
Patient didn't know how to take 1.5 tablets. She called the pharmacy and they told her how to do it and she was ok.

## 2022-05-08 ENCOUNTER — Telehealth: Payer: Self-pay

## 2022-05-08 NOTE — Telephone Encounter (Signed)
Prior Authorization submitted for INGREZZA 60 MG with [employer], response is APPROVAL for 40 mg, 60 mg, and 80 mg effective 05/06/2022-08/06/2022

## 2022-05-10 DIAGNOSIS — K5730 Diverticulosis of large intestine without perforation or abscess without bleeding: Secondary | ICD-10-CM | POA: Diagnosis not present

## 2022-05-10 DIAGNOSIS — Z86010 Personal history of colonic polyps: Secondary | ICD-10-CM | POA: Diagnosis not present

## 2022-05-10 DIAGNOSIS — Z1211 Encounter for screening for malignant neoplasm of colon: Secondary | ICD-10-CM | POA: Diagnosis not present

## 2022-05-10 DIAGNOSIS — K640 First degree hemorrhoids: Secondary | ICD-10-CM | POA: Diagnosis not present

## 2022-05-21 DIAGNOSIS — M7122 Synovial cyst of popliteal space [Baker], left knee: Secondary | ICD-10-CM | POA: Diagnosis not present

## 2022-05-21 DIAGNOSIS — M1712 Unilateral primary osteoarthritis, left knee: Secondary | ICD-10-CM | POA: Diagnosis not present

## 2022-05-23 DIAGNOSIS — F0390 Unspecified dementia without behavioral disturbance: Secondary | ICD-10-CM | POA: Diagnosis not present

## 2022-05-23 DIAGNOSIS — R413 Other amnesia: Secondary | ICD-10-CM | POA: Diagnosis not present

## 2022-05-27 DIAGNOSIS — F0390 Unspecified dementia without behavioral disturbance: Secondary | ICD-10-CM | POA: Diagnosis not present

## 2022-05-28 ENCOUNTER — Ambulatory Visit (INDEPENDENT_AMBULATORY_CARE_PROVIDER_SITE_OTHER): Payer: PPO | Admitting: Psychiatry

## 2022-05-28 DIAGNOSIS — Z8673 Personal history of transient ischemic attack (TIA), and cerebral infarction without residual deficits: Secondary | ICD-10-CM

## 2022-05-28 DIAGNOSIS — F3341 Major depressive disorder, recurrent, in partial remission: Secondary | ICD-10-CM

## 2022-05-28 DIAGNOSIS — F5101 Primary insomnia: Secondary | ICD-10-CM | POA: Diagnosis not present

## 2022-05-28 DIAGNOSIS — F411 Generalized anxiety disorder: Secondary | ICD-10-CM

## 2022-05-28 NOTE — Progress Notes (Signed)
PROBLEM-FOCUSED INITIAL PSYCHOTHERAPY EVALUATION Luan Moore, PhD LP [facility]  Name: [name] Date: 05/28/2022 Time spent: 50 min MRN: [id number] DOB: 1946-01-15 Guardian/Payee: self  PCP: Asencion Noble, MD Documentation requested on this visit: No  PROBLEM HISTORY Reason for Visit /Presenting Problem:  Chief Complaint  Patient presents with   Establish Care   Anxiety    Narrative/History of Present Illness Referred by Thayer Headings of this office for treatment of anxiety and depression in the context of short-term memory loss and stroke history.  Per last note, she is referred to [facility] this month.  Most recent psychiatry note from late June states improvement with Cymbalta and Modafinil, recovering interests and concentration enough to read actively again, and H report she resumed shopping and driving alone, though she c/o frequent worry, broken sleep, anxious insomnia once awakened, and not enjoying shopping.  Noted reassured by someone coming to stay evenings.  Dx includes tardive dyskinesia and PBA.  EHR showing [facility] service last week and with 7/13 lumbar puncture and MRI and what appears to be testing yesterday.  Working dx of Major neurocognitive disorder with differential of dementia (Lewy, Alz) vs. pseudodementia, suspicion of sleep apnea, and hx of 2019 post-concussion syndrome.  Report to [facility] cites long hx depression and anxiety, daughter required to manage meds, report of difficulty managing appointments, checkbook, hands of playing cards, and recalling conversations.  Procedures and office visits scheduled there Oct-Nov.    PT reports depression all her life,with repeat episodes.  This one started after Christmas (say, March), found herself crying all the time, "basically 'mean' all the time" (misspoke -- she actually meant crying).  H was working part time at [employer] of this year until about a week ago, found she was  unable to tolerate evenings, and fear of breakin.  Helped to knock off watching dramas that depicted breakins and other crime.  Soothing to switch to game shows and other TV also, and to have husband home.  Re. anxiety, has intrusive thoughts of burglars.  H [name] has installed an upgraded alarm system, but still intrusive fears of burglary.  Also finding she is reluctant to drive that much, mostly for feeling her lack of mobility.  Getting some press from the kids to sell the house and relocate to [place].  H not in a rush, either, and still enjoys mowing their large lawn.  Independent at home except feels she could use a housecleaner, just don't feel they could spend the money.  Family include [employer], 3 adult children in [place], [place], and [place].  Daughter Davonna has been the point person among the children, accompanying to most visits.  Aware she would be afraid of PT falling, esp after her mother in law did.  Aware she could use a nonslip mat in the tub, and they are problem-solving getting a grab bar in bathroom.  Medically, has a "bum knee", anticipating left TKR.  Being worked up at [facility] neurology, with spinal tap and MRI to come.  Says she got fatigued yesterday but did well in most all of it.  Particular trouble generating words.  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 15 yrs ago, left her with some left foot drag.    Has enjoyed yard work before knee issues.  Was making jewelry before hand arthritis made it impossible.  May enjoy going out to eat now with friends.  Was  doing some paint by number, anxious to find another dog pattern.  Has a friend in painting helping look for that, and able to coach.  Reading a series of 24 lighthearted books, on #12 now.  Finds she can laugh and cry with them.    Prior Psychiatric Assessment/Treatment:   Outpatient treatment: Thayer Headings, NP of this office,  previous Psychiatric hospitalization: deferred Psychological assessment/testing: none stated   Abuse/neglect screening: Victim of abuse: Not assessed at this time / none suspected.   Victim of neglect: Not assessed at this time / none suspected.   Perpetrator of abuse/neglect: Not assessed at this time / none suspected.   Witness / Exposure to Domestic Violence: Not assessed at this time / none suspected.   Witness to Community Violence:  Not assessed at this time / none suspected.   Protective Services Involvement: No.   Report needed: No.    Substance abuse screening: Current substance abuse: Not assessed at this time / none suspected.   History of impactful substance use/abuse: Not assessed at this time / none suspected.     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   Finances -- stable Spiritually -- Darrick Meigs Enjoyable activities -- reading, gardening Other situational factors affecting treatment and prognosis: Stressors from the following areas: Health problems Barriers to service: none stated  Notable cultural sensitivities: none stated Strengths: Able to Communicate Effectively   MED/SURG HISTORY Med/surg history was partially reviewed with PT at this time.  Of note for psychotherapy at this time is findings suggesting somaticizing. Past Medical History:  Diagnosis Date   Arthritis    Depression    H/O head injury    Hearing loss    History of stomach ulcers    Migraine    Stroke ([place])      Past Surgical History:  Procedure Laterality Date   ANKLE FRACTURE SURGERY     APPENDECTOMY     BREAST LUMPECTOMY     CESAREAN SECTION     PARTIAL HYSTERECTOMY     ROTATOR CUFF REPAIR      Allergies  Allergen Reactions   Ticlid [Ticlopidine] Shortness Of Breath   Nsaids Other (See Comments)    Bleeding  ulcer   Prednisone Other (See Comments)    Makes her stomach hurt--knows this isn't an allergy but doesn't want to take it    Monistat [Miconazole] Swelling and Rash   Sulfa Antibiotics Rash    Medications (as listed in Epic): Current Outpatient Medications  Medication Sig Dispense Refill   ARIPiprazole (ABILIFY) 5 MG tablet Take 1 tablet (5 mg total) by mouth daily. 30 tablet 1   busPIRone (BUSPAR) 15 MG tablet Take 1.5 tabs BID for one week, then increase to 30 mg BID 60 tablet 1   busPIRone (BUSPAR) 30 MG tablet TAKE (1) TABLET BY MOUTH TWICE DAILY. 60 tablet 0   cholecalciferol (VITAMIN D3) 25 MCG (1000 UNIT) tablet Take 3,000 Units by mouth daily.     DULoxetine (CYMBALTA) 60 MG capsule Take 1 capsule (60 mg total) by mouth 2 (two) times daily. 180 capsule 1   eszopiclone (LUNESTA) 2 MG TABS tablet TAKE (1) TABLET BY MOUTH AT BEDTIME. 30 tablet 5   LORazepam (ATIVAN) 0.5 MG tablet Take 1/2-1 tab twice daily as needed 60 tablet 1   modafinil (PROVIGIL) 200 MG tablet Take 1 tab po q  am and 1/2 tab po qd prn 45 tablet 4   Multiple Vitamin (MULTIVITAMIN) tablet Take 1 tablet by mouth daily.     MYRBETRIQ 50 MG TB24 tablet Take 50 mg by mouth daily.     SUMAtriptan (IMITREX) 100 MG tablet Take 100 mg by mouth every 2 (two) hours as needed for migraine or headache. May repeat in 2 hours if headache persists or recurs.     tiZANidine (ZANAFLEX) 2 MG tablet Take by mouth every 6 (six) hours as needed for muscle spasms. (Patient not taking: Reported on 02/14/2022)     valbenazine (INGREZZA) 40 MG capsule Take 1 capsule (40 mg total) by mouth at bedtime. Then increase to 60 mg at bedtime. 7 capsule 0   valbenazine (INGREZZA) 60 MG capsule Take 1 capsule (60 mg total) by mouth daily. 14 capsule 0   valbenazine (INGREZZA) 60 MG capsule Take 1 capsule (60 mg total) by mouth daily. 30 capsule 1   No current facility-administered medications for this visit.    MENTAL STATUS AND  OBSERVATIONS Appearance:   Casual     Behavior:  Appropriate  Motor:  noted facial tremor, holding cane off floor most of the walk back  Speech/Language:   Occasional word-finding problem, misspeech  Affect:  Constricted  Mood:  anxious  Thought process:  normal  Thought content:    worry  Sensory/Perceptual disturbances:    WNL  Orientation:  grossly intact  Attention:  Good  Concentration:  Good  Memory:  grossly intact  Fund of knowledge:   Good  Insight:    Good  Judgment:   Good  Impulse Control:  Good   Initial Risk Assessment: Danger to self: No Self-injurious behavior: No Danger to others: No Physical aggression / violence: No Duty to warn: No Access to firearms a concern: No Gang involvement: No Patient / guardian was educated about steps to take if suicide or homicide risk level increases between visits: yes While future psychiatric events cannot be accurately predicted, the patient does not currently require acute inpatient psychiatric care and does not currently meet [facility] involuntary commitment criteria.   DIAGNOSIS:    ICD-10-CM   1. Recurrent major depressive disorder, in partial remission ([place])  F33.41     2. Generalized anxiety disorder  F41.1     3. Primary insomnia  F51.01     4. History of cardioembolic cerebrovascular accident (CVA)  Z86.73       INITIAL TREATMENT: Support/validation provided for distressing symptoms and confirmed rapport Ethical orientation and informed consent confirmed re: privacy rights -- including but not limited to HIPAA, EMR and use of e-PHI patient responsibilities -- scheduling, fair notice of changes, in-person vs. telehealth and regulatory and financial conditions affecting choice expectations for working relationship in psychotherapy needs and consents for working partnerships and exchange of information with other health care providers, especially any medication and other behavioral health providers Initial  orientation to cognitive-behavioral and solution-focused therapy approach Psychoeducation and initial recommendations: Brainstormed things to enjoy Practiced thinking like a burglar and imagining what safety measures would do -- imagined being burglars together and seeing what deterrents she has -- CPI sign, dog -- and backing off.  Also tripping the alarm and being frightened off. Brought in husband Danny for briefing about approaching her fears in imagination and using burglar's perspective to see good safety Outlook for therapy -- scheduling constraints, availability of crisis service, inclusion of family member(s) as appropriate  Plan: Use imagery technique to  think through intrusive thoughts of burglary Presumably remains in c/o husband, now that he is come home from work, but endorse caregivers as needed/able/affordable  Maintain medication as prescribed and work faithfully with relevant prescriber(s) if any changes are desired or seem indicated Call the clinic on-call service, present to ER, or call 911 if any life-threatening psychiatric crisis Return for time as available.  Blanchie Serve, PhD  Luan Moore, PhD LP Clinical Psychologist, [facility] [address], [place], [place] [address] [phone]

## 2022-05-30 DIAGNOSIS — M1712 Unilateral primary osteoarthritis, left knee: Secondary | ICD-10-CM | POA: Diagnosis not present

## 2022-06-01 ENCOUNTER — Other Ambulatory Visit: Payer: Self-pay | Admitting: Psychiatry

## 2022-06-01 DIAGNOSIS — F411 Generalized anxiety disorder: Secondary | ICD-10-CM

## 2022-06-03 DIAGNOSIS — M1712 Unilateral primary osteoarthritis, left knee: Secondary | ICD-10-CM | POA: Diagnosis not present

## 2022-06-03 DIAGNOSIS — Z79899 Other long term (current) drug therapy: Secondary | ICD-10-CM | POA: Diagnosis not present

## 2022-06-03 DIAGNOSIS — M179 Osteoarthritis of knee, unspecified: Secondary | ICD-10-CM | POA: Diagnosis not present

## 2022-06-03 DIAGNOSIS — R609 Edema, unspecified: Secondary | ICD-10-CM | POA: Diagnosis not present

## 2022-06-06 ENCOUNTER — Ambulatory Visit (INDEPENDENT_AMBULATORY_CARE_PROVIDER_SITE_OTHER): Payer: PPO | Admitting: Psychiatry

## 2022-06-06 ENCOUNTER — Ambulatory Visit: Payer: PPO | Admitting: Psychiatry

## 2022-06-06 DIAGNOSIS — F5101 Primary insomnia: Secondary | ICD-10-CM | POA: Diagnosis not present

## 2022-06-06 DIAGNOSIS — F411 Generalized anxiety disorder: Secondary | ICD-10-CM | POA: Diagnosis not present

## 2022-06-06 DIAGNOSIS — F3341 Major depressive disorder, recurrent, in partial remission: Secondary | ICD-10-CM | POA: Diagnosis not present

## 2022-06-06 DIAGNOSIS — Z8673 Personal history of transient ischemic attack (TIA), and cerebral infarction without residual deficits: Secondary | ICD-10-CM | POA: Diagnosis not present

## 2022-06-06 NOTE — Progress Notes (Signed)
Psychotherapy Progress Note [facility] Luan Moore, PhD LP  Patient ID: [name])    MRN: [id number] Therapy format: Individual psychotherapy Date: 06/06/2022      Start: 3:12p     Stop: 4:00p     Time Spent: 48 min Location: Telehealth visit -- I connected with this patient by an approved telecommunication method (audio only), with her informed consent, and verifying identity and patient privacy.  I was located at my office and patient at her home.  As needed, we discussed the limitations, risks, and security and privacy concerns associated with telehealth service, including the availability and conditions which currently govern in-person appointments and the possibility that 3rd-party payment may not be fully guaranteed and she may be responsible for charges.  After she indicated understanding, we proceeded with the session.  Also discussed treatment planning, as needed, including ongoing verbal agreement with the plan, the opportunity to ask and answer all questions, her demonstrated understanding of instructions, and her readiness to call the office should symptoms worsen or she feels she is in a crisis state and needs more immediate and tangible assistance.   Session narrative (presenting needs, interim history, self-report of stressors and symptoms, applications of prior therapy, status changes, and interventions made in session) Says she's been struggling with feeling like not doing things.  One concern with a cruise idea next year but afraid to be away from home for long.  Surgery for TKR coming up.  Intimidated by story of a friend who went in for surgery a month ago and died due to a nursing mistake pulling out drains.  He had throat and esophageal cancer, much more complicated and delicate.  Reinforced the practice of self-reminding that her surgery is different, the mistake made with him (nurse pulled drains erroneously) can't be the mistakes made with her,  and oriented her to the practice of forming answers to her what-if questions to drive down worry over the unknown.  Surgery not yet scheduled, awaiting scheduling agent.  Basically more eager to get it done than afraid what might happen, affirmed being ready to get on with it.  Imagining a burglar's eye view of deterrents helped a good deal first session with fear of intruder/burglar.  Acknowledges it is still available to her for combatting worry about security as it arises.  Addressed reluctance to go out, e.g., with friends.  Main reluctance is at night, she says, just feels more unsettled and frightening after dark.  Probed her sense of willingness to do something daylight among friends.  Says most often the idea will be to go out to eat, which trends toward dark.  Encouraged her to come up with an idea or two that suits her comfort zone and pitch it to friends, says she can do that.  No issues with resisting seeing people who want to come over, family or friends.  Thinks one friend might feel she is being puny if she knew more why, but encouraged to consider that real friends know she's been laid up and would not expect too greatly of her.  On the other hand, friends could use more of a signal that there are things she's up to and interested, if depression has had her uninvolved a while.  As solitary interests go, still enjoying the book series, and Danny picked up some more.    Some concern that [name] wants to go back to work, feels he has a duty to provide more.  Most  likely would go back to the golf club, which might easily mean being away some more evenings, which would be a depressor for her.  Wants to have some more time and interaction with him.  Encouraged in asking assertively, if it's what she wants, and not framing it as being scared and having a need but just feeling lonely and wanting more companionship with him.  Had friends come over before when she was dealing with being terrified of  evenings, and did pay a companion for a time, but it's expensive.  Says she has asked him to limit time away in evenings.  Notes she needs to save money, so negotiated followup up to 2 months.  Therapeutic modalities: Cognitive Behavioral Therapy, Solution-Oriented/Positive Psychology, and Ego-Supportive  Mental Status/Observations:  Appearance:   Not assessed     Behavior:  Appropriate  Motor:  Not assessed  Speech/Language:   Clear and Coherent  Affect:  Not assessed  Mood:  anxious and dysthymic  Thought process:  normal  Thought content:    worry  Sensory/Perceptual disturbances:    WNL  Orientation:  Fully oriented  Attention:  Good    Concentration:  Good  Memory:  grossly intact  Insight:    Good  Judgment:   Good  Impulse Control:  Good   Risk Assessment: Danger to Self: No Self-injurious Behavior: No Danger to Others: No Physical Aggression / Violence: No Duty to Warn: No Access to Firearms a concern: No  Assessment of progress:  progressing  Diagnosis:   ICD-10-CM   1. Recurrent major depressive disorder, in partial remission ([place])  F33.41     2. Generalized anxiety disorder  F41.1     3. Primary insomnia  F51.01     4. History of cerebrovascular accident (CVA) in adulthood  Z86.73      Plan:  Use visualizations for worry about security/burglar Come up with preferable ideas for outings with friends and offer  Option still to have paid companion, but address it with [name] making reliable time and covering valid caregiving needs if they conflict with his re-engaging work schedule Continuing option to relocate as asked by the kids Other recommendations/advice as may be noted above Continue to utilize previously learned skills ad lib Maintain medication as prescribed and work faithfully with relevant prescriber(s) if any changes are desired or seem indicated Call the clinic on-call service, 988/hotline, 911, or present to [facility] or ER if any life-threatening  psychiatric crisis Return in about 2 months (around 08/07/2022). Already scheduled visit in this office 06/14/2022.  Blanchie Serve, PhD Luan Moore, PhD LP Clinical Psychologist, [facility] [address], [place], [place] [address] [phone]

## 2022-06-10 ENCOUNTER — Other Ambulatory Visit: Payer: Self-pay | Admitting: Psychiatry

## 2022-06-10 DIAGNOSIS — F5101 Primary insomnia: Secondary | ICD-10-CM

## 2022-06-11 NOTE — Telephone Encounter (Signed)
Last filled 7/8, due 8/5

## 2022-06-14 ENCOUNTER — Ambulatory Visit: Payer: PPO | Admitting: Psychiatry

## 2022-06-18 ENCOUNTER — Ambulatory Visit (INDEPENDENT_AMBULATORY_CARE_PROVIDER_SITE_OTHER): Payer: PPO | Admitting: Psychiatry

## 2022-06-18 ENCOUNTER — Encounter: Payer: Self-pay | Admitting: Psychiatry

## 2022-06-18 DIAGNOSIS — F411 Generalized anxiety disorder: Secondary | ICD-10-CM

## 2022-06-18 DIAGNOSIS — F5101 Primary insomnia: Secondary | ICD-10-CM

## 2022-06-18 DIAGNOSIS — F330 Major depressive disorder, recurrent, mild: Secondary | ICD-10-CM | POA: Diagnosis not present

## 2022-06-18 DIAGNOSIS — F3341 Major depressive disorder, recurrent, in partial remission: Secondary | ICD-10-CM

## 2022-06-18 MED ORDER — ARIPIPRAZOLE 5 MG PO TABS: 5.0000 mg | ORAL_TABLET | Freq: Every day | ORAL | 1 refills | Status: DC

## 2022-06-18 MED ORDER — LORAZEPAM 0.5 MG PO TABS: ORAL_TABLET | ORAL | 2 refills | Status: DC

## 2022-06-18 MED ORDER — BUSPIRONE HCL 30 MG PO TABS: 30.0000 mg | ORAL_TABLET | Freq: Two times a day (BID) | ORAL | 0 refills | Status: DC

## 2022-06-18 MED ORDER — ESZOPICLONE 2 MG PO TABS: ORAL_TABLET | ORAL | 2 refills | Status: DC
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
## 2022-06-28 ENCOUNTER — Other Ambulatory Visit: Payer: Self-pay

## 2022-06-28 DIAGNOSIS — G2401 Drug induced subacute dyskinesia: Secondary | ICD-10-CM

## 2022-06-28 MED ORDER — VALBENAZINE TOSYLATE 60 MG PO CAPS: 60.0000 mg | ORAL_CAPSULE | Freq: Every day | ORAL | 1 refills | Status: DC

## 2022-07-01 ENCOUNTER — Ambulatory Visit: Payer: Self-pay | Admitting: Physician Assistant

## 2022-07-01 DIAGNOSIS — M1712 Unilateral primary osteoarthritis, left knee: Secondary | ICD-10-CM | POA: Diagnosis not present

## 2022-07-01 DIAGNOSIS — G8929 Other chronic pain: Secondary | ICD-10-CM

## 2022-07-01 NOTE — H&P (Signed)
TOTAL KNEE ADMISSION H&P  Patient is being admitted for left total knee arthroplasty.  Subjective:  Chief Complaint:left knee pain.  HPI: [name], 76 y.o. female, has a history of pain and functional disability in the left knee due to arthritis and has failed non-surgical conservative treatments for greater than 12 weeks to includeNSAID's and/or analgesics, corticosteriod injections, viscosupplementation injections, use of assistive devices, and activity modification.  Onset of symptoms was gradual, starting >10 years ago with gradually worsening course since that time. The patient noted no past surgery on the left knee(s).  Patient currently rates pain in the left knee(s) at 10 out of 10 with activity. Patient has night pain, worsening of pain with activity and weight bearing, pain that interferes with activities of daily living, pain with passive range of motion, crepitus, and joint swelling.  Patient has evidence of periarticular osteophytes and joint space narrowing by imaging studies.There is no active infection.  Patient Active Problem List   Diagnosis Date Noted   S/P hysterectomy 01/30/2021   Visit for pelvic exam 01/30/2021   Chronic constipation 01/30/2021   SUI (stress urinary incontinence, female) 01/30/2021   Low back pain without sciatica 05/11/2019   Arthritis 08/01/2018   Knee pain, chronic 08/01/2018   CVA (cerebral vascular accident) ([place]) 08/01/2018   Hyperlipidemia 08/01/2018   Migraines 08/01/2018   Mechanical ptosis of bilateral eyelids 03/28/2017   Ptosis of eyelid, bilateral 03/28/2017   Nasal septal perforation 09/12/2016   Presbycusis of both ears 09/12/2016   Cognitive change 08/27/2016   Abnormal mammogram 09/07/2014   Allergic rhinitis 09/07/2014   Allergic urticaria 09/07/2014   Amaurosis fugax 09/07/2014   Asthma 09/07/2014   Benign neoplasm of colon 09/07/2014   Esophageal reflux 09/07/2014   Lesion of breast 09/07/2014   Sensorineural hearing  loss (SNHL), bilateral 09/07/2014   Tinnitus 09/07/2014   Neck pain, chronic 01/13/2014   Baker's cyst of knee 12/09/2013   Stiffness of joint, not elsewhere classified, other specified site 03/02/2013   Weakness of neck 03/02/2013   Past Medical History:  Diagnosis Date   Arthritis    Depression    H/O head injury    Hearing loss    History of stomach ulcers    Migraine    Stroke ([place])     Past Surgical History:  Procedure Laterality Date   ANKLE FRACTURE SURGERY     APPENDECTOMY     BREAST LUMPECTOMY     CESAREAN SECTION     PARTIAL HYSTERECTOMY     ROTATOR CUFF REPAIR      Current Outpatient Medications  Medication Sig Dispense Refill Last Dose   ARIPiprazole (ABILIFY) 5 MG tablet Take 1 tablet (5 mg total) by mouth daily. 90 tablet 1    busPIRone (BUSPAR) 30 MG tablet Take 1 tablet (30 mg total) by mouth 2 (two) times daily. 180 tablet 0    cholecalciferol (VITAMIN D3) 25 MCG (1000 UNIT) tablet Take 3,000 Units by mouth daily. (Patient not taking: Reported on 06/18/2022)      DULoxetine (CYMBALTA) 60 MG capsule Take 1 capsule (60 mg total) by mouth 2 (two) times daily. 180 capsule 1    [START ON 08/05/2022] eszopiclone (LUNESTA) 2 MG TABS tablet Take immediately before bedtime 30 tablet 2    [START ON 07/08/2022] LORazepam (ATIVAN) 0.5 MG tablet Take 1/2-1 tab twice daily as needed 60 tablet 2    modafinil (PROVIGIL) 200 MG tablet Take 1 tab po q am and 1/2 tab po  qd prn 45 tablet 4    Multiple Vitamin (MULTIVITAMIN) tablet Take 1 tablet by mouth daily. (Patient not taking: Reported on 06/18/2022)      MYRBETRIQ 50 MG TB24 tablet Take 50 mg by mouth daily.      SUMAtriptan (IMITREX) 100 MG tablet Take 100 mg by mouth every 2 (two) hours as needed for migraine or headache. May repeat in 2 hours if headache persists or recurs.      tiZANidine (ZANAFLEX) 2 MG tablet Take by mouth every 6 (six) hours as needed for muscle spasms. (Patient not taking: Reported on 02/14/2022)       valbenazine (INGREZZA) 60 MG capsule Take 1 capsule (60 mg total) by mouth daily. 30 capsule 1    No current facility-administered medications for this visit.   Allergies  Allergen Reactions   Ticlid [Ticlopidine] Shortness Of Breath   Nsaids Other (See Comments)    Bleeding ulcer   Prednisone Other (See Comments)    Makes her stomach hurt--knows this isn't an allergy but doesn't want to take it    Monistat [Miconazole] Swelling and Rash   Sulfa Antibiotics Rash    Social History   Tobacco Use   Smoking status: Never   Smokeless tobacco: Never  Substance Use Topics   Alcohol use: Yes    Comment: occ beer    Family History  Problem Relation Age of Onset   Cancer Father    Heart disease Brother    Alcohol abuse Brother    Depression Maternal Grandmother    Depression Grandchild    Breast cancer Daughter      Review of Systems  HENT:  Positive for hearing loss and tinnitus.   Cardiovascular:  Positive for leg swelling.  Gastrointestinal:  Positive for constipation.  Musculoskeletal:  Positive for arthralgias and joint swelling.  Neurological:  Positive for dizziness and headaches.  Hematological:  Bruises/bleeds easily.  Psychiatric/Behavioral:  The patient is nervous/anxious.   All other systems reviewed and are negative.   Objective:  Physical Exam Constitutional:      General: She is not in acute distress.    Appearance: Normal appearance.  HENT:     Head: Normocephalic and atraumatic.  Eyes:     Extraocular Movements: Extraocular movements intact.     Pupils: Pupils are equal, round, and reactive to light.  Cardiovascular:     Rate and Rhythm: Normal rate and regular rhythm.     Pulses: Normal pulses.     Heart sounds: Murmur heard.  Pulmonary:     Effort: Pulmonary effort is normal. No respiratory distress.     Breath sounds: Normal breath sounds. No wheezing.  Abdominal:     General: Abdomen is flat. Bowel sounds are normal. There is no distension.      Palpations: Abdomen is soft.     Tenderness: There is no abdominal tenderness.  Musculoskeletal:     Cervical back: Normal range of motion and neck supple.     Left knee: Swelling and bony tenderness present. No effusion or erythema. Decreased range of motion. Tenderness present.  Lymphadenopathy:     Cervical: No cervical adenopathy.  Skin:    General: Skin is warm and dry.     Findings: No erythema or rash.  Neurological:     General: No focal deficit present.     Mental Status: She is alert and oriented to person, place, and time.  Psychiatric:        Mood and Affect: Mood normal.  Behavior: Behavior normal.     Vital signs in last 24 hours: '@VSRANGES'$ @  Labs:   Estimated body mass index is 34.38 kg/m as calculated from the following:   Height as of 02/05/21: '5\' 5"'$  (1.651 m).   Weight as of 02/05/21: 93.7 kg.   Imaging Review Plain radiographs demonstrate moderate degenerative joint disease of the left knee(s). The overall alignment issignificant valgus. The bone quality appears to be good for age and reported activity level.      Assessment/Plan:  End stage arthritis, left knee   The patient history, physical examination, clinical judgment of the provider and imaging studies are consistent with end stage degenerative joint disease of the left knee(s) and total knee arthroplasty is deemed medically necessary. The treatment options including medical management, injection therapy arthroscopy and arthroplasty were discussed at length. The risks and benefits of total knee arthroplasty were presented and reviewed. The risks due to aseptic loosening, infection, stiffness, patella tracking problems, thromboembolic complications and other imponderables were discussed. The patient acknowledged the explanation, agreed to proceed with the plan and consent was signed. Patient is being admitted for inpatient treatment for surgery, pain control, PT, OT, prophylactic  antibiotics, VTE prophylaxis, progressive ambulation and ADL's and discharge planning. The patient is planning to be discharged  home with outpt PT.    Anticipated LOS equal to or greater than 2 midnights due to - Age 27 and older with one or more of the following:  - Obesity  - Expected need for hospital services (PT, OT, Nursing) required for safe  discharge  - Anticipated need for postoperative skilled nursing care or inpatient rehab  - Active co-morbidities: Stroke OR   - Unanticipated findings during/Post Surgery: None  - Patient is a high risk of re-admission due to: None

## 2022-07-01 NOTE — H&P (View-Only) (Signed)
TOTAL KNEE ADMISSION H&P  Patient is being admitted for left total knee arthroplasty.  Subjective:  Chief Complaint:left knee pain.  HPI: [name], 76 y.o. female, has a history of pain and functional disability in the left knee due to arthritis and has failed non-surgical conservative treatments for greater than 12 weeks to includeNSAID's and/or analgesics, corticosteriod injections, viscosupplementation injections, use of assistive devices, and activity modification.  Onset of symptoms was gradual, starting >10 years ago with gradually worsening course since that time. The patient noted no past surgery on the left knee(s).  Patient currently rates pain in the left knee(s) at 10 out of 10 with activity. Patient has night pain, worsening of pain with activity and weight bearing, pain that interferes with activities of daily living, pain with passive range of motion, crepitus, and joint swelling.  Patient has evidence of periarticular osteophytes and joint space narrowing by imaging studies.There is no active infection.  Patient Active Problem List   Diagnosis Date Noted   S/P hysterectomy 01/30/2021   Visit for pelvic exam 01/30/2021   Chronic constipation 01/30/2021   SUI (stress urinary incontinence, female) 01/30/2021   Low back pain without sciatica 05/11/2019   Arthritis 08/01/2018   Knee pain, chronic 08/01/2018   CVA (cerebral vascular accident) ([place]) 08/01/2018   Hyperlipidemia 08/01/2018   Migraines 08/01/2018   Mechanical ptosis of bilateral eyelids 03/28/2017   Ptosis of eyelid, bilateral 03/28/2017   Nasal septal perforation 09/12/2016   Presbycusis of both ears 09/12/2016   Cognitive change 08/27/2016   Abnormal mammogram 09/07/2014   Allergic rhinitis 09/07/2014   Allergic urticaria 09/07/2014   Amaurosis fugax 09/07/2014   Asthma 09/07/2014   Benign neoplasm of colon 09/07/2014   Esophageal reflux 09/07/2014   Lesion of breast 09/07/2014   Sensorineural hearing  loss (SNHL), bilateral 09/07/2014   Tinnitus 09/07/2014   Neck pain, chronic 01/13/2014   Baker's cyst of knee 12/09/2013   Stiffness of joint, not elsewhere classified, other specified site 03/02/2013   Weakness of neck 03/02/2013   Past Medical History:  Diagnosis Date   Arthritis    Depression    H/O head injury    Hearing loss    History of stomach ulcers    Migraine    Stroke ([place])     Past Surgical History:  Procedure Laterality Date   ANKLE FRACTURE SURGERY     APPENDECTOMY     BREAST LUMPECTOMY     CESAREAN SECTION     PARTIAL HYSTERECTOMY     ROTATOR CUFF REPAIR      Current Outpatient Medications  Medication Sig Dispense Refill Last Dose   ARIPiprazole (ABILIFY) 5 MG tablet Take 1 tablet (5 mg total) by mouth daily. 90 tablet 1    busPIRone (BUSPAR) 30 MG tablet Take 1 tablet (30 mg total) by mouth 2 (two) times daily. 180 tablet 0    cholecalciferol (VITAMIN D3) 25 MCG (1000 UNIT) tablet Take 3,000 Units by mouth daily. (Patient not taking: Reported on 06/18/2022)      DULoxetine (CYMBALTA) 60 MG capsule Take 1 capsule (60 mg total) by mouth 2 (two) times daily. 180 capsule 1    [START ON 08/05/2022] eszopiclone (LUNESTA) 2 MG TABS tablet Take immediately before bedtime 30 tablet 2    [START ON 07/08/2022] LORazepam (ATIVAN) 0.5 MG tablet Take 1/2-1 tab twice daily as needed 60 tablet 2    modafinil (PROVIGIL) 200 MG tablet Take 1 tab po q am and 1/2 tab po  qd prn 45 tablet 4    Multiple Vitamin (MULTIVITAMIN) tablet Take 1 tablet by mouth daily. (Patient not taking: Reported on 06/18/2022)      MYRBETRIQ 50 MG TB24 tablet Take 50 mg by mouth daily.      SUMAtriptan (IMITREX) 100 MG tablet Take 100 mg by mouth every 2 (two) hours as needed for migraine or headache. May repeat in 2 hours if headache persists or recurs.      tiZANidine (ZANAFLEX) 2 MG tablet Take by mouth every 6 (six) hours as needed for muscle spasms. (Patient not taking: Reported on 02/14/2022)       valbenazine (INGREZZA) 60 MG capsule Take 1 capsule (60 mg total) by mouth daily. 30 capsule 1    No current facility-administered medications for this visit.   Allergies  Allergen Reactions   Ticlid [Ticlopidine] Shortness Of Breath   Nsaids Other (See Comments)    Bleeding ulcer   Prednisone Other (See Comments)    Makes her stomach hurt--knows this isn't an allergy but doesn't want to take it    Monistat [Miconazole] Swelling and Rash   Sulfa Antibiotics Rash    Social History   Tobacco Use   Smoking status: Never   Smokeless tobacco: Never  Substance Use Topics   Alcohol use: Yes    Comment: occ beer    Family History  Problem Relation Age of Onset   Cancer Father    Heart disease Brother    Alcohol abuse Brother    Depression Maternal Grandmother    Depression Grandchild    Breast cancer Daughter      Review of Systems  HENT:  Positive for hearing loss and tinnitus.   Cardiovascular:  Positive for leg swelling.  Gastrointestinal:  Positive for constipation.  Musculoskeletal:  Positive for arthralgias and joint swelling.  Neurological:  Positive for dizziness and headaches.  Hematological:  Bruises/bleeds easily.  Psychiatric/Behavioral:  The patient is nervous/anxious.   All other systems reviewed and are negative.   Objective:  Physical Exam Constitutional:      General: She is not in acute distress.    Appearance: Normal appearance.  HENT:     Head: Normocephalic and atraumatic.  Eyes:     Extraocular Movements: Extraocular movements intact.     Pupils: Pupils are equal, round, and reactive to light.  Cardiovascular:     Rate and Rhythm: Normal rate and regular rhythm.     Pulses: Normal pulses.     Heart sounds: Murmur heard.  Pulmonary:     Effort: Pulmonary effort is normal. No respiratory distress.     Breath sounds: Normal breath sounds. No wheezing.  Abdominal:     General: Abdomen is flat. Bowel sounds are normal. There is no distension.      Palpations: Abdomen is soft.     Tenderness: There is no abdominal tenderness.  Musculoskeletal:     Cervical back: Normal range of motion and neck supple.     Left knee: Swelling and bony tenderness present. No effusion or erythema. Decreased range of motion. Tenderness present.  Lymphadenopathy:     Cervical: No cervical adenopathy.  Skin:    General: Skin is warm and dry.     Findings: No erythema or rash.  Neurological:     General: No focal deficit present.     Mental Status: She is alert and oriented to person, place, and time.  Psychiatric:        Mood and Affect: Mood normal.  Behavior: Behavior normal.     Vital signs in last 24 hours: '@VSRANGES'$ @  Labs:   Estimated body mass index is 34.38 kg/m as calculated from the following:   Height as of 02/05/21: '5\' 5"'$  (1.651 m).   Weight as of 02/05/21: 93.7 kg.   Imaging Review Plain radiographs demonstrate moderate degenerative joint disease of the left knee(s). The overall alignment issignificant valgus. The bone quality appears to be good for age and reported activity level.      Assessment/Plan:  End stage arthritis, left knee   The patient history, physical examination, clinical judgment of the provider and imaging studies are consistent with end stage degenerative joint disease of the left knee(s) and total knee arthroplasty is deemed medically necessary. The treatment options including medical management, injection therapy arthroscopy and arthroplasty were discussed at length. The risks and benefits of total knee arthroplasty were presented and reviewed. The risks due to aseptic loosening, infection, stiffness, patella tracking problems, thromboembolic complications and other imponderables were discussed. The patient acknowledged the explanation, agreed to proceed with the plan and consent was signed. Patient is being admitted for inpatient treatment for surgery, pain control, PT, OT, prophylactic  antibiotics, VTE prophylaxis, progressive ambulation and ADL's and discharge planning. The patient is planning to be discharged  home with outpt PT.    Anticipated LOS equal to or greater than 2 midnights due to - Age 46 and older with one or more of the following:  - Obesity  - Expected need for hospital services (PT, OT, Nursing) required for safe  discharge  - Anticipated need for postoperative skilled nursing care or inpatient rehab  - Active co-morbidities: Stroke OR   - Unanticipated findings during/Post Surgery: None  - Patient is a high risk of re-admission due to: None

## 2022-07-08 ENCOUNTER — Ambulatory Visit (HOSPITAL_COMMUNITY): Payer: PPO | Attending: Orthopedic Surgery

## 2022-07-08 ENCOUNTER — Telehealth: Payer: Self-pay | Admitting: Psychiatry

## 2022-07-08 DIAGNOSIS — M1712 Unilateral primary osteoarthritis, left knee: Secondary | ICD-10-CM

## 2022-07-08 DIAGNOSIS — R262 Difficulty in walking, not elsewhere classified: Secondary | ICD-10-CM

## 2022-07-08 DIAGNOSIS — M25562 Pain in left knee: Secondary | ICD-10-CM | POA: Insufficient documentation

## 2022-07-08 DIAGNOSIS — G8929 Other chronic pain: Secondary | ICD-10-CM | POA: Diagnosis not present

## 2022-07-08 DIAGNOSIS — G2401 Drug induced subacute dyskinesia: Secondary | ICD-10-CM

## 2022-07-08 NOTE — Telephone Encounter (Signed)
Pt left message reporting having problem with Ingrezza. Called Pt for more details. UTR. Contact # [phone]

## 2022-07-08 NOTE — Therapy (Signed)
OUTPATIENT PHYSICAL THERAPY LOWER EXTREMITY EVALUATION   Patient Name: [name] MRN: [id number] DOB:17-Nov-1945, 76 y.o., female Today's Date: 07/08/2022   PT End of Session - 07/08/22 1618     Visit Number 1    Number of Visits 1    Authorization Type health team advantage    PT Start Time 0175    PT Stop Time 0358    PT Time Calculation (min) 42 min             Past Medical History:  Diagnosis Date   Arthritis    Depression    H/O head injury    Hearing loss    History of stomach ulcers    Migraine    Stroke [facility])    Past Surgical History:  Procedure Laterality Date   ANKLE FRACTURE SURGERY     APPENDECTOMY     BREAST LUMPECTOMY     CESAREAN SECTION     PARTIAL HYSTERECTOMY     ROTATOR CUFF REPAIR     Patient Active Problem List   Diagnosis Date Noted   S/P hysterectomy 01/30/2021   Visit for pelvic exam 01/30/2021   Chronic constipation 01/30/2021   SUI (stress urinary incontinence, female) 01/30/2021   Low back pain without sciatica 05/11/2019   Arthritis 08/01/2018   Knee pain, chronic 08/01/2018   CVA (cerebral vascular accident) ([place]) 08/01/2018   Hyperlipidemia 08/01/2018   Migraines 08/01/2018   Mechanical ptosis of bilateral eyelids 03/28/2017   Ptosis of eyelid, bilateral 03/28/2017   Nasal septal perforation 09/12/2016   Presbycusis of both ears 09/12/2016   Cognitive change 08/27/2016   Abnormal mammogram 09/07/2014   Allergic rhinitis 09/07/2014   Allergic urticaria 09/07/2014   Amaurosis fugax 09/07/2014   Asthma 09/07/2014   Benign neoplasm of colon 09/07/2014   Esophageal reflux 09/07/2014   Lesion of breast 09/07/2014   Sensorineural hearing loss (SNHL), bilateral 09/07/2014   Tinnitus 09/07/2014   Neck pain, chronic 01/13/2014   Baker's cyst of knee 12/09/2013   Stiffness of joint, not elsewhere classified, other specified site 03/02/2013   Weakness of neck 03/02/2013    PCP:   Asencion Noble, MD    REFERRING PROVIDER:  PT eval/tx for s/p lt total knee replacement per Charlies Constable, MD    REFERRING DIAG: PT eval/tx for s/p lt total knee replacement per Charlies Constable, MD   THERAPY DIAG:  Chronic pain of left knee  Difficulty in walking, not elsewhere classified  Primary osteoarthritis of left knee  Rationale for Evaluation and Treatment Rehabilitation  ONSET DATE: scheduled to have  Left TKA 07/22/2022  SUBJECTIVE:   SUBJECTIVE STATEMENT: Left knee pain for many years; set up to have left TKA 07/22/22 at [employer]: Has a tremor  PAIN:  Are you having pain? Yes: NPRS scale: 5/10 Pain location: knee Pain description: aching and really sore Aggravating factors: standing and walking Relieving factors: ice  PRECAUTIONS: None  WEIGHT BEARING RESTRICTIONS No  FALLS:  Has patient fallen in last 6 months? No  LIVING ENVIRONMENT: Lives with: lives with their spouse Lives in: House/apartment Stairs: Yes: External: 2 steps; none Has following equipment at home: Single point cane and Walker - 2 wheeled  OCCUPATION: retired  PLOF: Independent with household mobility with device  PATIENT GOALS prepare for surgery   OBJECTIVE:   DIAGNOSTIC FINDINGS: none in epic  PATIENT SURVEYS:  FOTO 52  COGNITION:  Overall cognitive status: Within functional limits for tasks assessed  SENSATION: WFL  EDEMA:  Yes swelling noted lower legs and ankles   LOWER EXTREMITY ROM:  Active ROM Right eval Left eval  Hip flexion    Hip extension    Hip abduction    Hip adduction    Hip internal rotation    Hip external rotation    Knee flexion 96   Knee extension -8   Ankle dorsiflexion    Ankle plantarflexion    Ankle inversion    Ankle eversion     (Blank rows = not tested)  LOWER EXTREMITY MMT:  MMT Right eval Left eval  Hip flexion 5 4+  Hip extension    Hip abduction    Hip adduction    Hip internal rotation    Hip external rotation    Knee  flexion (sitting) 5 3+  Knee extension 5 4-  Ankle dorsiflexion 5 4+  Ankle plantarflexion    Ankle inversion    Ankle eversion     (Blank rows = not tested)  FUNCTIONAL TESTS:  5 times sit to stand: 28 sec using arms to assist  GAIT: Distance walked: 60 Assistive device utilized: Single point cane Level of assistance: Modified independence Comments: antalgic gait    TODAY'S TREATMENT: Physical therapy evaluation and HEP instruction   PATIENT EDUCATION:  Education details: Patient educated on exam findings, POC, scope of PT, HEP. Person educated: Patient Education method: Explanation, Demonstration, and Handouts Education comprehension: verbalized understanding, returned demonstration, verbal cues required, and tactile cues required  HOME EXERCISE PROGRAM: Access Code: [email or web address] URL: [email or web address] Date: 07/08/2022 Prepared by: AP - Rehab  Exercises - Supine Quad Set  - 2 x daily - 7 x weekly - 1 sets - 10 reps - Supine Heel Slide  - 2 x daily - 7 x weekly - 1 sets - 10 reps - Supine Hip Abduction  - 2 x daily - 7 x weekly - 1 sets - 10 reps - Supine Ankle Pumps  - 2 x daily - 7 x weekly - 1 sets - 10 reps - Seated Long Arc Quad  - 2 x daily - 7 x weekly - 1 sets - 10 reps - Seated Heel Toe Raises  - 2 x daily - 7 x weekly - 1 sets - 10 reps - Seated Hamstring Curl with Anchored Resistance  - 2 x daily - 7 x weekly - 1 sets - 10 reps  ASSESSMENT:  CLINICAL IMPRESSION: Patient is a 76 y.o. lady who was seen today for physical therapy evaluation and treatment for left knee osteoarthritis in preparation for TKA in Sept. Patient presents with knee pain, limited mobility and decreased left leg strength that negatively impact her daily life; her tolerance for standing and walking in home and community. Patient will benefit from skilled therapy services to prepare for left TKA and to address deficits and promote optimal function.    OBJECTIVE IMPAIRMENTS  Abnormal gait, decreased activity tolerance, decreased balance, decreased coordination, decreased endurance, decreased mobility, difficulty walking, decreased ROM, decreased strength, hypomobility, increased edema, increased fascial restrictions, impaired perceived functional ab, female Today's Date: 07/08/2022   PT End of Session - 07/08/22 1618     Visit Number 1    Number of Visits 1    Authorization Type health team advantage    PT Start Time 0175    PT Stop Time 0358    PT Time Calculation (min) 42 min             Past Medical History:  Diagnosis Date   Arthritis    Depression    H/O head injury    Hearing loss    History of stomach ulcers    Migraine    Stroke [facility])    Past Surgical History:  Procedure Laterality Date   ANKLE FRACTURE SURGERY     APPENDECTOMY     BREAST LUMPECTOMY     CESAREAN SECTION     PARTIAL HYSTERECTOMY     ROTATOR CUFF REPAIR     Patient Active Problem List   Diagnosis Date Noted   S/P hysterectomy 01/30/2021   Visit for pelvic exam 01/30/2021   Chronic constipation 01/30/2021   SUI (stress urinary incontinence, female) 01/30/2021   Low back pain without sciatica 05/11/2019   Arthritis 08/01/2018   Knee pain, chronic 08/01/2018   CVA (cerebral vascular accident) ([place]) 08/01/2018   Hyperlipidemia 08/01/2018   Migraines 08/01/2018   Mechanical ptosis of bilateral eyelids 03/28/2017   Ptosis of eyelid, bilateral 03/28/2017   Nasal septal perforation 09/12/2016   Presbycusis of both ears 09/12/2016   Cognitive change 08/27/2016   Abnormal mammogram 09/07/2014   Allergic rhinitis 09/07/2014   Allergic urticaria 09/07/2014   Amaurosis fugax 09/07/2014   Asthma 09/07/2014   Benign neoplasm of colon 09/07/2014   Esophageal reflux 09/07/2014   Lesion of breast 09/07/2014   Sensorineural hearing loss (SNHL), bilateral 09/07/2014   Tinnitus 09/07/2014   Neck pain, chronic 01/13/2014   Baker's cyst of knee 12/09/2013   Stiffness of joint, not elsewhere classified, other specified site 03/02/2013   Weakness of neck 03/02/2013    PCP:   Asencion Noble, MD    REFERRING PROVIDER:  PT eval/tx for s/p lt total knee replacement per Charlies Constable, MD   REFERRING DIAG: PT eval/tx for s/p lt total knee replacement per Charlies Constable, MD   THERAPY DIAG:  Chronic pain of left knee  Difficulty in walking, not elsewhere classified  Primary osteoarthritis of left knee  Rationale for Evaluation and Treatment Rehabilitation  ONSET DATE: scheduled to have  Left TKA 07/22/2022  SUBJECTIVE:   SUBJECTIVE STATEMENT: Left knee pain for many years; set up to have left TKA 07/22/22 at [employer]: Has a tremor  PAIN:  Are you having pain? Yes: NPRS scale: 5/10 Pain location: knee Pain description: aching and really sore Aggravating factors: standing and walking Relieving factors: ice  PRECAUTIONS: None  WEIGHT BEARING RESTRICTIONS No  FALLS:  Has patient fallen in last 6 months? No  LIVING ENVIRONMENT: Lives with: lives with their spouse Lives in: House/apartment Stairs: Yes: External: 2 steps; none Has following equipment at home: Single point cane and Walker - 2 wheeled  OCCUPATION: retired  PLOF: Independent with household mobility with device  PATIENT GOALS prepare for surgery   OBJECTIVE:   DIAGNOSTIC FINDINGS: none in epic  PATIENT SURVEYS:  FOTO 52  COGNITION:  Overall cognitive status: Within functional limits for tasks assessed  SENSATION: WFL  EDEMA:  Yes swelling noted lower legs and ankles   LOWER EXTREMITY ROM:  Active ROM Right eval Left eval  Hip flexion    Hip extension    Hip abduction    Hip adduction    Hip internal rotation    Hip external rotation    Knee flexion 96   Knee extension -8   Ankle dorsiflexion    Ankle plantarflexion    Ankle inversion    Ankle eversion     (Blank rows = not tested)  LOWER EXTREMITY MMT:  MMT Right eval Left eval  Hip flexion 5 4+  Hip extension    Hip abduction    Hip adduction    Hip internal rotation    Hip external rotation    Knee  flexion (sitting) 5 3+  Knee extension 5 4-  Ankle dorsiflexion 5 4+  Ankle plantarflexion    Ankle inversion    Ankle eversion     (Blank rows = not tested)  FUNCTIONAL TESTS:  5 times sit to stand: 28 sec using arms to assist  GAIT: Distance walked: 60 Assistive device utilized: Single point cane Level of assistance: Modified independence Comments: antalgic gait    TODAY'S TREATMENT: Physical therapy evaluation and HEP instruction   PATIENT EDUCATION:  Education details: Patient educated on exam findings, POC, scope of PT, HEP. Person educated: Patient Education method: Explanation, Demonstration, and Handouts Education comprehension: verbalized understanding, returned demonstration, verbal cues required, and tactile cues required  HOME EXERCISE PROGRAM: Access Code: [email or web address] URL: [email or web address] Date: 07/08/2022 Prepared by: [facility] - Rehab  Exercises - Supine Quad Set  - 2 x daily - 7 x weekly - 1 sets - 10 reps - Supine Heel Slide  - 2 x daily - 7 x weekly - 1 sets - 10 reps - Supine Hip Abduction  - 2 x daily - 7 x weekly - 1 sets - 10 reps - Supine Ankle Pumps  - 2 x daily - 7 x weekly - 1 sets - 10 reps - Seated Long Arc Quad  - 2 x daily - 7 x weekly - 1 sets - 10 reps - Seated Heel Toe Raises  - 2 x daily - 7 x weekly - 1 sets - 10 reps - Seated Hamstring Curl with Anchored Resistance  - 2 x daily - 7 x weekly - 1 sets - 10 reps  ASSESSMENT:  CLINICAL IMPRESSION: Patient is a 76 y.o. lady who was seen today for physical therapy evaluation and treatment for left knee osteoarthritis in preparation for TKA in Sept. Patient presents with knee pain, limited mobility and decreased left leg strength that negatively impact her daily life; her tolerance for standing and walking in home and community. Patient will benefit from skilled therapy services to prepare for left TKA and to address deficits and promote optimal function.    OBJECTIVE IMPAIRMENTS  Abnormal gait, decreased activity tolerance, decreased balance, decreased coordination, decreased endurance, decreased mobility, difficulty walking, decreased ROM, decreased strength, hypomobility, increased edema, increased fascial restrictions, impaired perceived functional ability, impaired flexibility, and pain.   ACTIVITY LIMITATIONS carrying, lifting, bending, standing, squatting, sleeping, stairs, transfers, bathing, toileting, locomotion level, and caring for others  PARTICIPATION LIMITATIONS: meal prep, cleaning, laundry, medication management, personal finances, shopping, community activity, occupation, and yard work  PERSONAL FACTORS Fitness are also affecting patient's functional outcome.   REHAB POTENTIAL: Good  CLINICAL DECISION MAKING: Stable/uncomplicated  EVALUATION COMPLEXITY: Low   GOALS: Goals reviewed with  patient? No  SHORT TERM GOALS: Target date: 07/08/22  Patient will be independent with HEP Baseline: Goal status: MET    PLAN: PT FREQUENCY: 1x/week  PT DURATION: 1 week PLANNED INTERVENTIONS: Therapeutic exercises, Therapeutic activity, Neuromuscular re-education, Balance training, Gait training, Patient/Family education, Joint manipulation, Joint mobilization, Stair training, Orthotic/Fit training, DME instructions, Aquatic Therapy, Dry Needling, Electrical stimulation, Spinal manipulation, Spinal mobilization, Cryotherapy, Moist heat, Compression bandaging, scar mobilization, Splintting, Taping, Traction, Ultrasound, Ionotophoresis 49m/ml Dexamethasone, and Manual therapy  PLAN FOR NEXT SESSION: one visit only until patient has surgery   4:22 PM, 07/08/22 Korrine Sicard Small Henrique Parekh MPT [facility] [place] #[phone]:[phone]

## 2022-07-08 NOTE — Telephone Encounter (Signed)
LVM to RC 

## 2022-07-09 MED ORDER — VALBENAZINE TOSYLATE 80 MG PO CAPS: 80.0000 mg | ORAL_CAPSULE | Freq: Every day | ORAL | 1 refills | Status: DC

## 2022-07-09 NOTE — Telephone Encounter (Signed)
Have her mouth movements worsened recently or is she saying that they are the same/unimproved with Ingrezza? It looks like she was thinking Ingrezza may be making things worse- does she mean it is worse since starting Ingrezza or that she the movements are worse a few hours after she takes Ingrezza?

## 2022-07-09 NOTE — Addendum Note (Signed)
Addended by: Sharyl Nimrod on: 07/09/2022 02:43 PM   Modules accepted: Orders

## 2022-07-09 NOTE — Telephone Encounter (Signed)
Pt RTC and LVM. Having mouth movement when she takes Ingrezza. Has gotten worse over time. Can't control it.

## 2022-07-09 NOTE — Telephone Encounter (Signed)
Called patient and let her know about Rx. She asked if they will mail it to her. Called pharmacy and they said they would mail it out today. Notified patient.

## 2022-07-09 NOTE — Telephone Encounter (Signed)
Recommend increasing Ingrezza to 80 mg to determine if this will help more with movements. Script for 80 mg capsules has been sent to [place].

## 2022-07-09 NOTE — Telephone Encounter (Signed)
Patient said the 60 mg Ingrezza is no longer controlling her TD. She said she has mouth movements pretty much all the time, but she is still able to sleep.

## 2022-07-09 NOTE — Telephone Encounter (Signed)
Per your last note patient and daughter felt the mouth movements had improved. Today patient is saying that her family noticed this weekend the "fastness" of the movements has increased. Note says to take medication at bedtime so she can't say if movements get worse after taking.

## 2022-07-16 ENCOUNTER — Encounter (HOSPITAL_COMMUNITY): Payer: PPO

## 2022-07-16 NOTE — Progress Notes (Addendum)
COVID Vaccine received:  '[]'$  No '[x]'$  Yes Date of any COVID positive Test in last 90 days:  none  PCP - Asencion Noble, MD Cardiologist - None Psychiatry - Blanchie Serve, PhD Neurologist - Vicenta Aly, MD  [facility]   [address] [place], [place] [address]   [phone]    Fax  [phone]  Chest x-ray - 01-16-2017  Epic EKG - 12-20-2015  Epic  Stress Test - 2004 Cardiac Cath - n/a  Pacemaker/ICD device     '[x]'$  N/A Spinal Cord Stimulator:'[x]'$  No '[]'$  Yes      Other Implants:   History of Sleep Apnea? '[x]'$  No '[]'$  Yes   Sleep Study Date:   CPAP used?- '[x]'$  No '[]'$  Yes  (Instruct to bring their mask & Tubing)  Does the patient monitor blood sugar? '[]'$  No '[]'$  Yes  '[x]'$  N/A  Blood Thinner Instructions: None Aspirin Instructions:none Last Dose:  ERAS Protocol Ordered: '[]'$  No  '[x]'$  Yes PRE-SURGERY '[x]'$  ENSURE  '[]'$  G2   Comments: mild Memory Loss  Activity level: Patient can not climb a flight of stairs without difficulty;    Anesthesia review: Hx CVA, Memory Loss,  Cardiac PA heard Heart Murmur at preop visit on 07-01-2022 but patient states that this is not new and most of the time nobody can hear it.  Patient denies shortness of breath, fever, and chest pain at PAT appointment. She did say that she has had a cough x 2 weeks.   Patient verbalized understanding and agreement to the Pre-Surgical Instructions that were given to them at this PAT appointment. Patient was also educated of the need to review these PAT instructions again prior to his/her surgery.I reviewed the appropriate phone numbers to call if they have any and questions or concerns.

## 2022-07-16 NOTE — Patient Instructions (Addendum)
DUE TO SPACE LIMITATIONS, ONLY TWO VISITORS  (aged 75 and older) ARE ALLOWED TO COME WITH YOU AND STAY IN THE WAITING ROOM DURING YOUR PRE OP AND PROCEDURE.   **NO VISITORS ARE ALLOWED IN THE SHORT STAY AREA OR RECOVERY ROOM!!**  IF YOU WILL BE ADMITTED INTO THE HOSPITAL YOU ARE ALLOWED ONLY FOUR SUPPORT PEOPLE DURING VISITATION HOURS (7 AM -8PM)   The support person(s) must pass our screening, and use Hand sanitizing gel. Visitors GUEST BADGE MUST BE WORN VISIBLY  One adult visitor may remain with you overnight and MUST be in the room by 8 P.M.   You are not required to quarantine at this time prior to your surgery. However, you must do this: Hand Hygiene often Do NOT share personal items Notify your provider if you are in close contact with someone who has COVID or you develop fever 100.4 or greater, new onset of sneezing, cough, sore throat, shortness of breath or body aches.       Your procedure is scheduled on:  Monday  July 22, 2022  Report to [facility] Main Entrance.  Report to admitting at:  07:45   AM  +++++Call this number if you have any questions or problems the morning of surgery [phone]  Do not eat food :After Midnight the night prior to your surgery/procedure.  After Midnight you may have the following liquids until  07:15 AM DAY OF SURGERY  Clear Liquid Diet Water Black Coffee (sugar ok, NO MILK/CREAM OR CREAMERS)  Tea (sugar ok, NO MILK/CREAM OR CREAMERS) regular and decaf                             Plain Jell-O (NO RED)                                           Fruit ices (not with fruit pulp, NO RED)                                     Popsicles (NO RED)                                                                  Juice: apple, WHITE grape, WHITE cranberry Sports drinks like Gatorade (NO RED)                   The day of surgery:  Drink ONE (1) Pre-Surgery Clear Ensure at  07:15  AM the morning of surgery. Drink in one sitting. Do  not sip.  This drink was given to you during your hospital pre-op appointment visit. Nothing else to drink after completing the Pre-Surgery Clear Ensure    FOLLOW ANY ADDITIONAL PRE OP INSTRUCTIONS YOU RECEIVED FROM YOUR SURGEON'S OFFICE!!!   Oral Hygiene is also important to reduce your risk of infection.        Remember - BRUSH YOUR TEETH THE MORNING OF SURGERY WITH YOUR REGULAR TOOTHPASTE   Take ONLY these medicines the morning of surgery with A SIP OF  WATER: Buspirone (Buspar), Aripiprazole (Abilify), Myrbetriq. IF needed you may take Tramadol, Tylenol, Lorazepam or Imitrex.                     You may not have any metal on your body including hair pins, jewelry, and body piercing  Do not wear make-up, lotions, powders, perfumes or deodorant  Do not wear nail polish including gel and S&S, artificial / acrylic nails, or any other type of covering on natural nails including finger and toenails. If you have artificial nails, gel coating, etc., that needs to be removed by a nail salon, Please have this removed prior to surgery. Not doing so may mean that your surgery could be cancelled or delayed if the Surgeon or anesthesia staff feels like they are unable to monitor you safely.   Do not shave 48 hours prior to surgery to avoid nicks in your skin which may contribute to postoperative infections.   You may bring a small overnight bag with you on the day of surgery, only pack items that are not valuable .[facility] IS NOT RESPONSIBLE   FOR VALUABLES THAT ARE LOST OR STOLEN.   DO NOT [place]. PHARMACY WILL DISPENSE MEDICATIONS LISTED ON YOUR MEDICATION LIST TO YOU DURING YOUR ADMISSION [facility]!   Special Instructions: Bring a copy of your healthcare power of attorney and living will documents the day of surgery, if you wish to have them scanned into your [facility] Medical Records- EPIC  Please read over the following fact sheets you were given:  IF YOU HAVE QUESTIONS ABOUT YOUR PRE-OP INSTRUCTIONS, PLEASE CALL [phone]  ([place])   [facility] - Preparing for Surgery Before surgery, you can play an important role.  Because skin is not sterile, your skin needs to be as free of germs as possible.  You can reduce the number of germs on your skin by washing with CHG (chlorahexidine gluconate) soap before surgery.  CHG is an antiseptic cleaner which kills germs and bonds with the skin to continue killing germs even after washing. Please DO NOT use if you have an allergy to CHG or antibacterial soaps.  If your skin becomes reddened/irritated stop using the CHG and inform your nurse when you arrive at Short Stay. Do not shave (including legs and underarms) for at least 48 hours prior to the first CHG shower.  You may shave your face/neck.  Please follow these instructions carefully:  1.  Shower with CHG Soap the night before surgery and the  morning of surgery.  2.  If you choose to wash your hair, wash your hair first as usual with your normal  shampoo.  3.  After you shampoo, rinse your hair and body thoroughly to remove the shampoo.                             4.  Use CHG as you would any other liquid soap.  You can apply chg directly to the skin and wash.  Gently with a scrungie or clean washcloth.  5.  Apply the CHG Soap to your body ONLY FROM THE NECK DOWN.   Do not use on face/ open                           Wound or open sores. Avoid contact with eyes, ears mouth and genitals (private parts).  Wash face,  Genitals (private parts) with your normal soap.             6.  Wash thoroughly, paying special attention to the area where your  surgery  will be performed.  7.  Thoroughly rinse your body with warm water from the neck down.  8.  DO NOT shower/wash with your normal soap after using and rinsing off the CHG Soap.            9.  Pat yourself dry with a clean towel.            10.  Wear clean pajamas.            11.   Place clean sheets on your bed the night of your first shower and do not  sleep with pets.  ON THE DAY OF SURGERY : Do not apply any lotions/deodorants the morning of surgery.  Please wear clean clothes to the hospital/surgery center.    FAILURE TO FOLLOW THESE INSTRUCTIONS MAY RESULT IN THE CANCELLATION OF YOUR SURGERY  PATIENT SIGNATURE_________________________________  NURSE SIGNATURE__________________________________  ________________________________________________________________________           [name]    An incentive spirometer is a tool that can help keep your lungs clear and active. This tool measures how well you are filling your lungs with each breath. Taking long deep breaths may help reverse or decrease the chance of developing breathing (pulmonary) problems (especially infection) following: A long period of time when you are unable to move or be active. BEFORE THE PROCEDURE  If the spirometer includes an indicator to show your best effort, your nurse or respiratory therapist will set it to a desired goal. If possible, sit up straight or lean slightly forward. Try not to slouch. Hold the incentive spirometer in an upright position. INSTRUCTIONS FOR USE  Sit on the edge of your bed if possible, or sit up as far as you can in bed or on a chair. Hold the incentive spirometer in an upright position. Breathe out normally. Place the mouthpiece in your mouth and seal your lips tightly around it. Breathe in slowly and as deeply as possible, raising the piston or the ball toward the top of the column. Hold your breath for 3-5 seconds or for as long as possible. Allow the piston or ball to fall to the bottom of the column. Remove the mouthpiece from your mouth and breathe out normally. Rest for a few seconds and repeat Steps 1 through 7 at least 10 times every 1-2 hours when you are awake. Take your time and take a few normal breaths between deep  breaths. The spirometer may include an indicator to show your best effort. Use the indicator as a goal to work toward during each repetition. After each set of 10 deep breaths, practice coughing to be sure your lungs are clear. If you have an incision (the cut made at the time of surgery), support your incision when coughing by placing a pillow or rolled up towels firmly against it. Once you are able to get out of bed, walk around indoors and cough well. You may stop using the incentive spirometer when instructed by your caregiver.  RISKS AND COMPLICATIONS Take your time so you do not get dizzy or light-headed. If you are in pain, you may need to take or ask for pain medication before doing incentive spirometry. It is harder to take a deep breath if you are having pain. AFTER USE Rest  and breathe slowly and easily. It can be helpful to keep track of a log of your progress. Your caregiver can provide you with a simple table to help with this. If you are using the spirometer at home, follow these instructions: [place] IF:  You are having difficultly using the spirometer. You have trouble using the spirometer as often as instructed. Your pain medication is not giving enough relief while using the spirometer. You develop fever of 100.5 F (38.1 C) or higher.                                                                                                    SEEK IMMEDIATE MEDICAL CARE IF:  You cough up bloody sputum that had not been present before. You develop fever of 102 F (38.9 C) or greater. You develop worsening pain at or near the incision site. MAKE SURE YOU:  Understand these instructions. Will watch your condition. Will get help right away if you are not doing well or get worse. Document Released: 03/10/2007 Document Revised: 01/20/2012 Document Reviewed: 05/11/2007 [facility] Patient Information 2014 [place], [place].

## 2022-07-17 NOTE — Care Plan (Signed)
Ortho Bundle Case Management Note  Patient Details  Name: [name] MRN: [id number] Date of Birth: 1946-01-29    Met with patient prior to surgery in the office. Will discharge to home with husband to assist. HHPT referral to [place] and OPPT set up with [facility]. Has rolling walker at home. Patient and MD in agreement with plan. Choice offered                  DME Arranged:    DME Agency:     HH Arranged:  PT HH Agency:  [facility]  Additional Comments: Please contact me with any questions of if this plan should need to change.  Ladell Heads,  [place] Specialist  [phone] 07/17/2022, 1:48 PM

## 2022-07-18 ENCOUNTER — Encounter (HOSPITAL_COMMUNITY)
Admission: RE | Admit: 2022-07-18 | Discharge: 2022-07-18 | Disposition: A | Discharge status: Home / Self Care | Payer: PPO | Source: Ambulatory Visit | Attending: Orthopedic Surgery | Admitting: Orthopedic Surgery

## 2022-07-18 ENCOUNTER — Other Ambulatory Visit: Payer: Self-pay

## 2022-07-18 ENCOUNTER — Encounter (HOSPITAL_COMMUNITY): Payer: Self-pay

## 2022-07-18 VITALS — BP 123/70 | HR 72 | Temp 98.4°F | Resp 20 | Ht 65.0 in

## 2022-07-18 DIAGNOSIS — M25562 Pain in left knee: Secondary | ICD-10-CM | POA: Insufficient documentation

## 2022-07-18 DIAGNOSIS — Z01818 Encounter for other preprocedural examination: Secondary | ICD-10-CM | POA: Insufficient documentation

## 2022-07-18 DIAGNOSIS — G8929 Other chronic pain: Secondary | ICD-10-CM | POA: Diagnosis not present

## 2022-07-18 DIAGNOSIS — I2510 Atherosclerotic heart disease of native coronary artery without angina pectoris: Secondary | ICD-10-CM | POA: Diagnosis not present

## 2022-07-18 HISTORY — DX: Personal history of urinary calculi: Z87.442

## 2022-07-18 HISTORY — DX: Pneumonia, unspecified organism: J18.9

## 2022-07-18 HISTORY — DX: Unspecified asthma, uncomplicated: J45.909

## 2022-07-18 HISTORY — DX: Anemia, unspecified: D64.9

## 2022-07-18 HISTORY — DX: Cardiac murmur, unspecified: R01.1

## 2022-07-18 LAB — COMPREHENSIVE METABOLIC PANEL
ALT: 15 U/L (ref 0–44)
AST: 24 U/L (ref 15–41)
Albumin: 4.1 g/dL (ref 3.5–5.0)
Alkaline Phosphatase: 50 U/L (ref 38–126)
Anion gap: 5 (ref 5–15)
BUN: 19 mg/dL (ref 8–23)
CO2: 30 mmol/L (ref 22–32)
Calcium: 9.4 mg/dL (ref 8.9–10.3)
Chloride: 106 mmol/L (ref 98–111)
Creatinine, Ser: 0.69 mg/dL (ref 0.44–1.00)
GFR, Estimated: >60 mL/min (ref >60)
Glucose, Bld: 98 mg/dL (ref 70–99)
Potassium: 4.3 mmol/L (ref 3.5–5.1)
Sodium: 141 mmol/L (ref 135–145)
Total Bilirubin: 0.5 mg/dL (ref 0.3–1.2)
Total Protein: 7 g/dL (ref 6.5–8.1)

## 2022-07-18 LAB — CBC WITH DIFFERENTIAL/PLATELET
Abs Immature Granulocytes: 0.02 10*3/uL (ref 0.00–0.07)
Basophils Absolute: 0.1 10*3/uL (ref 0.0–0.1)
Basophils Relative: 1 %
Eosinophils Absolute: 0.1 10*3/uL (ref 0.0–0.5)
Eosinophils Relative: 2 %
HCT: 41 % (ref 36.0–46.0)
Hemoglobin: 12.7 g/dL (ref 12.0–15.0)
Immature Granulocytes: 0 %
Lymphocytes Relative: 22 %
Lymphs Abs: 1.4 10*3/uL (ref 0.7–4.0)
MCH: 31.1 pg (ref 26.0–34.0)
MCHC: 31 g/dL (ref 30.0–36.0)
MCV: 100.2 fL — ABNORMAL HIGH (ref 80.0–100.0)
Monocytes Absolute: 0.6 10*3/uL (ref 0.1–1.0)
Monocytes Relative: 10 %
Neutro Abs: 4 10*3/uL (ref 1.7–7.7)
Neutrophils Relative %: 65 %
Platelets: 214 10*3/uL (ref 150–400)
RBC: 4.09 MIL/uL (ref 3.87–5.11)
RDW: 13 % (ref 11.5–15.5)
WBC: 6.2 10*3/uL (ref 4.0–10.5)
nRBC: 0 % (ref 0.0–0.2)

## 2022-07-18 LAB — SURGICAL PCR SCREEN
MRSA, PCR: NEGATIVE
Staphylococcus aureus: POSITIVE — AB

## 2022-07-19 NOTE — Progress Notes (Signed)
Patient's PCR screen is positive for STAPH. Appropriate notes have been placed on the patient's chart. This note has been routed to Dr. Zachery Dakins for review. The Patient's surgery is currently Scheduled for: Monday 07-22-22  Leota Jacobsen, BSN, CVRN-BC   Pre-Surgical Testing Nurse [place]  [phone]

## 2022-07-22 ENCOUNTER — Encounter (HOSPITAL_COMMUNITY): Payer: Self-pay | Admitting: Orthopedic Surgery

## 2022-07-22 ENCOUNTER — Other Ambulatory Visit: Payer: Self-pay

## 2022-07-22 ENCOUNTER — Ambulatory Visit (HOSPITAL_BASED_OUTPATIENT_CLINIC_OR_DEPARTMENT_OTHER): Payer: PPO | Admitting: Anesthesiology

## 2022-07-22 ENCOUNTER — Observation Stay (HOSPITAL_COMMUNITY): Payer: PPO

## 2022-07-22 ENCOUNTER — Ambulatory Visit (HOSPITAL_COMMUNITY): Payer: PPO | Admitting: Physician Assistant

## 2022-07-22 ENCOUNTER — Encounter (HOSPITAL_COMMUNITY)
Admission: RE | Disposition: A | Discharge status: Skilled Nursing Facility | Payer: Self-pay | Source: Home / Self Care | Attending: Orthopedic Surgery

## 2022-07-22 ENCOUNTER — Inpatient Hospital Stay (HOSPITAL_COMMUNITY)
Admission: RE | Admit: 2022-07-22 | Discharge: 2022-07-29 | DRG: 470 | Disposition: A | Discharge status: Skilled Nursing Facility | Payer: PPO | Attending: Orthopedic Surgery | Admitting: Orthopedic Surgery

## 2022-07-22 DIAGNOSIS — M21062 Valgus deformity, not elsewhere classified, left knee: Secondary | ICD-10-CM | POA: Diagnosis present

## 2022-07-22 DIAGNOSIS — H900 Conductive hearing loss, bilateral: Secondary | ICD-10-CM | POA: Diagnosis not present

## 2022-07-22 DIAGNOSIS — Z6834 Body mass index (BMI) 34.0-34.9, adult: Secondary | ICD-10-CM | POA: Diagnosis not present

## 2022-07-22 DIAGNOSIS — R488 Other symbolic dysfunctions: Secondary | ICD-10-CM | POA: Diagnosis not present

## 2022-07-22 DIAGNOSIS — R279 Unspecified lack of coordination: Secondary | ICD-10-CM | POA: Diagnosis not present

## 2022-07-22 DIAGNOSIS — F339 Major depressive disorder, recurrent, unspecified: Secondary | ICD-10-CM | POA: Diagnosis not present

## 2022-07-22 DIAGNOSIS — D649 Anemia, unspecified: Secondary | ICD-10-CM | POA: Diagnosis not present

## 2022-07-22 DIAGNOSIS — M6281 Muscle weakness (generalized): Secondary | ICD-10-CM | POA: Diagnosis not present

## 2022-07-22 DIAGNOSIS — G2401 Drug induced subacute dyskinesia: Secondary | ICD-10-CM | POA: Diagnosis not present

## 2022-07-22 DIAGNOSIS — M1712 Unilateral primary osteoarthritis, left knee: Secondary | ICD-10-CM | POA: Diagnosis present

## 2022-07-22 DIAGNOSIS — M9712XD Periprosthetic fracture around internal prosthetic left knee joint, subsequent encounter: Secondary | ICD-10-CM | POA: Diagnosis not present

## 2022-07-22 DIAGNOSIS — Z20822 Contact with and (suspected) exposure to covid-19: Secondary | ICD-10-CM | POA: Diagnosis present

## 2022-07-22 DIAGNOSIS — F32A Depression, unspecified: Secondary | ICD-10-CM | POA: Diagnosis present

## 2022-07-22 DIAGNOSIS — H9113 Presbycusis, bilateral: Secondary | ICD-10-CM | POA: Diagnosis present

## 2022-07-22 DIAGNOSIS — E669 Obesity, unspecified: Secondary | ICD-10-CM | POA: Diagnosis present

## 2022-07-22 DIAGNOSIS — Z96652 Presence of left artificial knee joint: Secondary | ICD-10-CM | POA: Diagnosis not present

## 2022-07-22 DIAGNOSIS — R262 Difficulty in walking, not elsewhere classified: Secondary | ICD-10-CM | POA: Diagnosis not present

## 2022-07-22 DIAGNOSIS — R2681 Unsteadiness on feet: Secondary | ICD-10-CM | POA: Diagnosis not present

## 2022-07-22 DIAGNOSIS — Z741 Need for assistance with personal care: Secondary | ICD-10-CM | POA: Diagnosis not present

## 2022-07-22 DIAGNOSIS — G43809 Other migraine, not intractable, without status migrainosus: Secondary | ICD-10-CM | POA: Diagnosis not present

## 2022-07-22 DIAGNOSIS — I2510 Atherosclerotic heart disease of native coronary artery without angina pectoris: Secondary | ICD-10-CM

## 2022-07-22 DIAGNOSIS — Z811 Family history of alcohol abuse and dependence: Secondary | ICD-10-CM | POA: Diagnosis not present

## 2022-07-22 DIAGNOSIS — Z888 Allergy status to other drugs, medicaments and biological substances status: Secondary | ICD-10-CM | POA: Diagnosis not present

## 2022-07-22 DIAGNOSIS — Z8249 Family history of ischemic heart disease and other diseases of the circulatory system: Secondary | ICD-10-CM | POA: Diagnosis not present

## 2022-07-22 DIAGNOSIS — M159 Polyosteoarthritis, unspecified: Secondary | ICD-10-CM | POA: Diagnosis not present

## 2022-07-22 DIAGNOSIS — G8918 Other acute postprocedural pain: Secondary | ICD-10-CM | POA: Diagnosis not present

## 2022-07-22 DIAGNOSIS — G8929 Other chronic pain: Secondary | ICD-10-CM | POA: Diagnosis not present

## 2022-07-22 DIAGNOSIS — Z79899 Other long term (current) drug therapy: Secondary | ICD-10-CM | POA: Diagnosis not present

## 2022-07-22 DIAGNOSIS — N393 Stress incontinence (female) (male): Secondary | ICD-10-CM | POA: Diagnosis not present

## 2022-07-22 DIAGNOSIS — E785 Hyperlipidemia, unspecified: Secondary | ICD-10-CM | POA: Diagnosis present

## 2022-07-22 DIAGNOSIS — Z818 Family history of other mental and behavioral disorders: Secondary | ICD-10-CM | POA: Diagnosis not present

## 2022-07-22 DIAGNOSIS — Z01818 Encounter for other preprocedural examination: Principal | ICD-10-CM

## 2022-07-22 DIAGNOSIS — F0150 Vascular dementia without behavioral disturbance: Secondary | ICD-10-CM | POA: Diagnosis not present

## 2022-07-22 DIAGNOSIS — Z743 Need for continuous supervision: Secondary | ICD-10-CM | POA: Diagnosis not present

## 2022-07-22 DIAGNOSIS — I959 Hypotension, unspecified: Secondary | ICD-10-CM | POA: Diagnosis not present

## 2022-07-22 DIAGNOSIS — Z23 Encounter for immunization: Secondary | ICD-10-CM | POA: Diagnosis not present

## 2022-07-22 DIAGNOSIS — Z803 Family history of malignant neoplasm of breast: Secondary | ICD-10-CM

## 2022-07-22 DIAGNOSIS — Z8673 Personal history of transient ischemic attack (TIA), and cerebral infarction without residual deficits: Secondary | ICD-10-CM

## 2022-07-22 DIAGNOSIS — K219 Gastro-esophageal reflux disease without esophagitis: Secondary | ICD-10-CM | POA: Diagnosis not present

## 2022-07-22 DIAGNOSIS — R278 Other lack of coordination: Secondary | ICD-10-CM | POA: Diagnosis not present

## 2022-07-22 DIAGNOSIS — Z882 Allergy status to sulfonamides status: Secondary | ICD-10-CM | POA: Diagnosis not present

## 2022-07-22 DIAGNOSIS — Z886 Allergy status to analgesic agent status: Secondary | ICD-10-CM | POA: Diagnosis not present

## 2022-07-22 DIAGNOSIS — I639 Cerebral infarction, unspecified: Secondary | ICD-10-CM | POA: Diagnosis not present

## 2022-07-22 DIAGNOSIS — M5459 Other low back pain: Secondary | ICD-10-CM | POA: Diagnosis not present

## 2022-07-22 DIAGNOSIS — Z471 Aftercare following joint replacement surgery: Secondary | ICD-10-CM | POA: Diagnosis not present

## 2022-07-22 HISTORY — PX: TOTAL KNEE ARTHROPLASTY: SHX125

## 2022-07-22 LAB — TYPE AND SCREEN
ABO/RH(D): O POS
Antibody Screen: NEGATIVE

## 2022-07-22 LAB — ABO/RH: ABO/RH(D): O POS

## 2022-07-22 SURGERY — ARTHROPLASTY, KNEE, TOTAL
Anesthesia: Spinal | Site: Knee | Laterality: Left

## 2022-07-22 MED ORDER — CEFAZOLIN SODIUM-DEXTROSE 2-4 GM/100ML-% IV SOLN
2.0000 g | Freq: Four times a day (QID) | INTRAVENOUS | Status: AC
  Administered 2022-07-22 (×2): 2 g via INTRAVENOUS
  Filled 2022-07-22 (×2): qty 100

## 2022-07-22 MED ORDER — ACETAMINOPHEN 325 MG PO TABS
325.0000 mg | ORAL_TABLET | Freq: Four times a day (QID) | ORAL | Status: DC | PRN
  Administered 2022-07-23 – 2022-07-24 (×2): 650 mg via ORAL
  Filled 2022-07-22 (×2): qty 2

## 2022-07-22 MED ORDER — DEXMEDETOMIDINE (PRECEDEX) IN NS 20 MCG/5ML (4 MCG/ML) IV SYRINGE
PREFILLED_SYRINGE | INTRAVENOUS | Status: DC | PRN
  Administered 2022-07-22 (×2): 4 ug via INTRAVENOUS

## 2022-07-22 MED ORDER — LORAZEPAM 0.5 MG PO TABS
0.2500 mg | ORAL_TABLET | Freq: Two times a day (BID) | ORAL | Status: DC | PRN
  Administered 2022-07-22 – 2022-07-29 (×6): 0.5 mg via ORAL
  Filled 2022-07-22 (×7): qty 1

## 2022-07-22 MED ORDER — OXYCODONE HCL 5 MG PO TABS
5.0000 mg | ORAL_TABLET | ORAL | Status: DC | PRN
  Administered 2022-07-22 – 2022-07-25 (×11): 10 mg via ORAL
  Administered 2022-07-26: 5 mg via ORAL
  Administered 2022-07-26 (×2): 10 mg via ORAL
  Administered 2022-07-27: 5 mg via ORAL
  Administered 2022-07-27: 10 mg via ORAL
  Administered 2022-07-27 – 2022-07-28 (×3): 5 mg via ORAL
  Filled 2022-07-22 (×4): qty 2
  Filled 2022-07-22: qty 1
  Filled 2022-07-22 (×2): qty 2
  Filled 2022-07-22: qty 1
  Filled 2022-07-22 (×8): qty 2
  Filled 2022-07-22 (×2): qty 1
  Filled 2022-07-22: qty 2

## 2022-07-22 MED ORDER — ONDANSETRON HCL 4 MG/2ML IJ SOLN: 4.0000 mg | Freq: Four times a day (QID) | INTRAMUSCULAR | Status: DC | PRN

## 2022-07-22 MED ORDER — ROPIVACAINE HCL 5 MG/ML IJ SOLN
INTRAMUSCULAR | Status: DC | PRN
  Administered 2022-07-22: 30 mL via PERINEURAL

## 2022-07-22 MED ORDER — SODIUM CHLORIDE 0.9% FLUSH
INTRAVENOUS | Status: DC | PRN
  Administered 2022-07-22: 60 mL

## 2022-07-22 MED ORDER — HYDROMORPHONE HCL 1 MG/ML IJ SOLN
0.5000 mg | INTRAMUSCULAR | Status: DC | PRN
  Administered 2022-07-22: 0.5 mg via INTRAVENOUS
  Filled 2022-07-22: qty 1

## 2022-07-22 MED ORDER — ONDANSETRON HCL 4 MG/2ML IJ SOLN
INTRAMUSCULAR | Status: AC
  Filled 2022-07-22: qty 2

## 2022-07-22 MED ORDER — POVIDONE-IODINE 10 % EX SWAB
2.0000 | Freq: Once | CUTANEOUS | Status: AC
  Administered 2022-07-22: 2 via TOPICAL

## 2022-07-22 MED ORDER — MIDAZOLAM HCL 2 MG/2ML IJ SOLN
INTRAMUSCULAR | Status: AC
  Filled 2022-07-22: qty 2

## 2022-07-22 MED ORDER — PROPOFOL 500 MG/50ML IV EMUL
INTRAVENOUS | Status: DC | PRN
  Administered 2022-07-22: 75 ug/kg/min via INTRAVENOUS

## 2022-07-22 MED ORDER — PROPOFOL 10 MG/ML IV BOLUS
INTRAVENOUS | Status: DC | PRN
  Administered 2022-07-22: 20 mg via INTRAVENOUS
  Administered 2022-07-22: 10 mg via INTRAVENOUS

## 2022-07-22 MED ORDER — ONDANSETRON HCL 4 MG PO TABS
4.0000 mg | ORAL_TABLET | Freq: Four times a day (QID) | ORAL | Status: DC | PRN
  Administered 2022-07-24: 4 mg via ORAL
  Filled 2022-07-22: qty 1

## 2022-07-22 MED ORDER — DOCUSATE SODIUM 100 MG PO CAPS
100.0000 mg | ORAL_CAPSULE | Freq: Two times a day (BID) | ORAL | Status: DC
  Administered 2022-07-22 – 2022-07-29 (×14): 100 mg via ORAL
  Filled 2022-07-22 (×14): qty 1

## 2022-07-22 MED ORDER — MIDAZOLAM HCL 2 MG/2ML IJ SOLN
2.0000 mg | INTRAMUSCULAR | Status: DC
  Administered 2022-07-22: 1 mg via INTRAVENOUS
  Filled 2022-07-22: qty 2

## 2022-07-22 MED ORDER — BUPIVACAINE LIPOSOME 1.3 % IJ SUSP
INTRAMUSCULAR | Status: AC
  Filled 2022-07-22: qty 20

## 2022-07-22 MED ORDER — OXYCODONE HCL 5 MG/5ML PO SOLN: 5.0000 mg | Freq: Once | ORAL | Status: DC | PRN

## 2022-07-22 MED ORDER — ASPIRIN 81 MG PO CHEW
81.0000 mg | CHEWABLE_TABLET | Freq: Two times a day (BID) | ORAL | Status: DC
  Administered 2022-07-22 – 2022-07-29 (×14): 81 mg via ORAL
  Filled 2022-07-22 (×14): qty 1

## 2022-07-22 MED ORDER — ONDANSETRON HCL 4 MG/2ML IJ SOLN
INTRAMUSCULAR | Status: DC | PRN
  Administered 2022-07-22: 4 mg via INTRAVENOUS

## 2022-07-22 MED ORDER — PHENOL 1.4 % MT LIQD: 1.0000 | OROMUCOSAL | Status: DC | PRN

## 2022-07-22 MED ORDER — ISOPROPYL ALCOHOL 70 % SOLN
Status: DC | PRN
  Administered 2022-07-22: 1 via TOPICAL

## 2022-07-22 MED ORDER — BUPIVACAINE LIPOSOME 1.3 % IJ SUSP
INTRAMUSCULAR | Status: DC | PRN
  Administered 2022-07-22: 20 mL

## 2022-07-22 MED ORDER — SODIUM CHLORIDE 0.9 % IR SOLN
Status: DC | PRN
  Administered 2022-07-22: 3000 mL

## 2022-07-22 MED ORDER — ACETAMINOPHEN 500 MG PO TABS
1000.0000 mg | ORAL_TABLET | Freq: Once | ORAL | Status: AC
  Administered 2022-07-22: 1000 mg via ORAL
  Filled 2022-07-22: qty 2

## 2022-07-22 MED ORDER — LACTATED RINGERS IV SOLN: INTRAVENOUS | Status: DC

## 2022-07-22 MED ORDER — BUSPIRONE HCL 10 MG PO TABS
30.0000 mg | ORAL_TABLET | Freq: Two times a day (BID) | ORAL | Status: DC
  Administered 2022-07-22 – 2022-07-29 (×14): 30 mg via ORAL
  Filled 2022-07-22 (×14): qty 3

## 2022-07-22 MED ORDER — SODIUM CHLORIDE 0.9 % IR SOLN
Status: DC | PRN
  Administered 2022-07-22: 500 mL

## 2022-07-22 MED ORDER — FENTANYL CITRATE (PF) 100 MCG/2ML IJ SOLN
INTRAMUSCULAR | Status: AC
  Filled 2022-07-22: qty 2

## 2022-07-22 MED ORDER — FENTANYL CITRATE (PF) 100 MCG/2ML IJ SOLN
INTRAMUSCULAR | Status: DC | PRN
  Administered 2022-07-22: 50 ug via INTRAVENOUS

## 2022-07-22 MED ORDER — SODIUM CHLORIDE 0.9 % IV SOLN: INTRAVENOUS | Status: DC

## 2022-07-22 MED ORDER — DIPHENHYDRAMINE HCL 12.5 MG/5ML PO ELIX: 12.5000 mg | ORAL_SOLUTION | ORAL | Status: DC | PRN

## 2022-07-22 MED ORDER — CEFAZOLIN SODIUM-DEXTROSE 2-4 GM/100ML-% IV SOLN
2.0000 g | INTRAVENOUS | Status: AC
  Administered 2022-07-22: 2 g via INTRAVENOUS
  Filled 2022-07-22: qty 100

## 2022-07-22 MED ORDER — PANTOPRAZOLE SODIUM 40 MG PO TBEC
40.0000 mg | DELAYED_RELEASE_TABLET | Freq: Every day | ORAL | Status: DC
  Administered 2022-07-22 – 2022-07-29 (×7): 40 mg via ORAL
  Filled 2022-07-22 (×7): qty 1

## 2022-07-22 MED ORDER — MENTHOL 3 MG MT LOZG: 1.0000 | LOZENGE | OROMUCOSAL | Status: DC | PRN

## 2022-07-22 MED ORDER — VITAMIN D 25 MCG (1000 UNIT) PO TABS
5000.0000 [IU] | ORAL_TABLET | Freq: Every day | ORAL | Status: DC
  Administered 2022-07-23 – 2022-07-29 (×7): 5000 [IU] via ORAL
  Filled 2022-07-22 (×7): qty 5

## 2022-07-22 MED ORDER — OXYCODONE HCL 5 MG PO TABS: 5.0000 mg | ORAL_TABLET | Freq: Once | ORAL | Status: DC | PRN

## 2022-07-22 MED ORDER — LIDOCAINE HCL (PF) 2 % IJ SOLN
INTRAMUSCULAR | Status: AC
  Filled 2022-07-22: qty 5

## 2022-07-22 MED ORDER — VALBENAZINE TOSYLATE 40 MG PO CAPS
80.0000 mg | ORAL_CAPSULE | Freq: Every day | ORAL | Status: DC
  Administered 2022-07-22 – 2022-07-25 (×4): 80 mg via ORAL
  Filled 2022-07-22 (×7): qty 2

## 2022-07-22 MED ORDER — ORAL CARE MOUTH RINSE: 15.0000 mL | Freq: Once | OROMUCOSAL | Status: AC

## 2022-07-22 MED ORDER — POLYETHYLENE GLYCOL 3350 17 G PO PACK
17.0000 g | PACK | Freq: Every day | ORAL | Status: DC | PRN
  Administered 2022-07-27 – 2022-07-28 (×3): 17 g via ORAL
  Filled 2022-07-22 (×3): qty 1

## 2022-07-22 MED ORDER — DULOXETINE HCL 60 MG PO CPEP
120.0000 mg | ORAL_CAPSULE | Freq: Every day | ORAL | Status: DC
  Administered 2022-07-22 – 2022-07-28 (×7): 120 mg via ORAL
  Filled 2022-07-22 (×7): qty 2

## 2022-07-22 MED ORDER — PHENYLEPHRINE HCL-NACL 20-0.9 MG/250ML-% IV SOLN
INTRAVENOUS | Status: AC
  Filled 2022-07-22: qty 250

## 2022-07-22 MED ORDER — LIDOCAINE HCL (CARDIAC) PF 100 MG/5ML IV SOSY
PREFILLED_SYRINGE | INTRAVENOUS | Status: DC | PRN
  Administered 2022-07-22: 60 mg via INTRAVENOUS

## 2022-07-22 MED ORDER — ZOLPIDEM TARTRATE 5 MG PO TABS
5.0000 mg | ORAL_TABLET | Freq: Every evening | ORAL | Status: DC | PRN
  Administered 2022-07-25: 5 mg via ORAL
  Filled 2022-07-22 (×2): qty 1

## 2022-07-22 MED ORDER — ACETAMINOPHEN 500 MG PO TABS
1000.0000 mg | ORAL_TABLET | Freq: Four times a day (QID) | ORAL | Status: AC
  Administered 2022-07-22 – 2022-07-23 (×4): 1000 mg via ORAL
  Filled 2022-07-22 (×4): qty 2

## 2022-07-22 MED ORDER — FENTANYL CITRATE PF 50 MCG/ML IJ SOSY
100.0000 ug | PREFILLED_SYRINGE | INTRAMUSCULAR | Status: DC
  Administered 2022-07-22: 50 ug via INTRAVENOUS
  Filled 2022-07-22: qty 2

## 2022-07-22 MED ORDER — ONDANSETRON HCL 4 MG/2ML IJ SOLN: 4.0000 mg | Freq: Once | INTRAMUSCULAR | Status: DC | PRN

## 2022-07-22 MED ORDER — MIRABEGRON ER 25 MG PO TB24
50.0000 mg | ORAL_TABLET | Freq: Every day | ORAL | Status: DC
  Administered 2022-07-23 – 2022-07-29 (×7): 50 mg via ORAL
  Filled 2022-07-22 (×7): qty 2

## 2022-07-22 MED ORDER — 0.9 % SODIUM CHLORIDE (POUR BTL) OPTIME
TOPICAL | Status: DC | PRN
  Administered 2022-07-22: 1000 mL

## 2022-07-22 MED ORDER — BUPIVACAINE IN DEXTROSE 0.75-8.25 % IT SOLN
INTRATHECAL | Status: DC | PRN
  Administered 2022-07-22: 1.8 mL via INTRATHECAL

## 2022-07-22 MED ORDER — SODIUM CHLORIDE (PF) 0.9 % IJ SOLN
INTRAMUSCULAR | Status: AC
  Filled 2022-07-22: qty 30

## 2022-07-22 MED ORDER — SODIUM CHLORIDE (PF) 0.9 % IJ SOLN
INTRAMUSCULAR | Status: AC
Start: 2022-07-22 — End: ?
  Filled 2022-07-22: qty 30

## 2022-07-22 MED ORDER — WATER FOR IRRIGATION, STERILE IR SOLN
Status: DC | PRN
  Administered 2022-07-22: 2000 mL

## 2022-07-22 MED ORDER — ARIPIPRAZOLE 10 MG PO TABS
5.0000 mg | ORAL_TABLET | Freq: Every day | ORAL | Status: DC
  Administered 2022-07-23 – 2022-07-29 (×7): 5 mg via ORAL
  Filled 2022-07-22 (×7): qty 1

## 2022-07-22 MED ORDER — PROPOFOL 1000 MG/100ML IV EMUL
INTRAVENOUS | Status: AC
  Filled 2022-07-22: qty 100

## 2022-07-22 MED ORDER — FENTANYL CITRATE PF 50 MCG/ML IJ SOSY: 25.0000 ug | PREFILLED_SYRINGE | INTRAMUSCULAR | Status: DC | PRN

## 2022-07-22 MED ORDER — PHENYLEPHRINE HCL-NACL 20-0.9 MG/250ML-% IV SOLN
INTRAVENOUS | Status: DC | PRN
  Administered 2022-07-22: 30 ug/min via INTRAVENOUS

## 2022-07-22 MED ORDER — CHLORHEXIDINE GLUCONATE 0.12 % MT SOLN
15.0000 mL | Freq: Once | OROMUCOSAL | Status: AC
  Administered 2022-07-22: 15 mL via OROMUCOSAL

## 2022-07-22 MED ORDER — BUPIVACAINE LIPOSOME 1.3 % IJ SUSP: 20.0000 mL | Freq: Once | INTRAMUSCULAR | Status: DC

## 2022-07-22 MED ORDER — TRANEXAMIC ACID-NACL 1000-0.7 MG/100ML-% IV SOLN
1000.0000 mg | INTRAVENOUS | Status: AC
  Administered 2022-07-22: 1000 mg via INTRAVENOUS
  Filled 2022-07-22: qty 100

## 2022-07-22 SURGICAL SUPPLY — 68 items
ADH SKN CLS APL DERMABOND .7 (GAUZE/BANDAGES/DRESSINGS) ×3
APL PRP STRL LF DISP 70% ISPRP (MISCELLANEOUS) ×2
BAG COUNTER SPONGE SURGICOUNT (BAG) IMPLANT
BAG SPNG CNTER NS LX DISP (BAG)
BLADE SAG 18X100X1.27 (BLADE) ×1 IMPLANT
BLADE SAW SAG 35X64 .89 (BLADE) ×1 IMPLANT
BNDG CMPR 5X3 CHSV STRCH STRL (GAUZE/BANDAGES/DRESSINGS) ×1
BNDG CMPR MED 10X6 ELC LF (GAUZE/BANDAGES/DRESSINGS) ×1
BNDG COHESIVE 3X5 TAN ST LF (GAUZE/BANDAGES/DRESSINGS) ×1 IMPLANT
BNDG ELASTIC 6X10 VLCR STRL LF (GAUZE/BANDAGES/DRESSINGS) ×1 IMPLANT
BOWL SMART MIX CTS (DISPOSABLE) ×1 IMPLANT
BSPLAT TIB 5D D CMNT STM LT (Knees) ×1 IMPLANT
CEMENT BONE R 1X40 (Cement) IMPLANT
CHLORAPREP W/TINT 26 (MISCELLANEOUS) ×2 IMPLANT
COMP FEM CMT CR PERS SZ8 LT (Joint) ×1 IMPLANT
COMPONENT FEM CMT CR PRS SZ8LT (Joint) IMPLANT
COVER SURGICAL LIGHT HANDLE (MISCELLANEOUS) ×1 IMPLANT
CUFF TOURN SGL QUICK 34 (TOURNIQUET CUFF) ×1
CUFF TRNQT CYL 34X4.125X (TOURNIQUET CUFF) ×1 IMPLANT
DERMABOND ADVANCED .7 DNX12 (GAUZE/BANDAGES/DRESSINGS) ×1 IMPLANT
DRAPE INCISE IOBAN 85X60 (DRAPES) ×1 IMPLANT
DRAPE SHEET LG 3/4 BI-LAMINATE (DRAPES) ×1 IMPLANT
DRAPE U-SHAPE 47X51 STRL (DRAPES) ×1 IMPLANT
DRESSING AQUACEL AG SP 3.5X10 (GAUZE/BANDAGES/DRESSINGS) ×1 IMPLANT
DRSG AQUACEL AG ADV 3.5X10 (GAUZE/BANDAGES/DRESSINGS) IMPLANT
DRSG AQUACEL AG SP 3.5X10 (GAUZE/BANDAGES/DRESSINGS) ×1
ELECT REM PT RETURN 15FT ADLT (MISCELLANEOUS) ×1 IMPLANT
GAUZE SPONGE 4X4 12PLY STRL (GAUZE/BANDAGES/DRESSINGS) ×1 IMPLANT
GLOVE BIO SURGEON STRL SZ 6.5 (GLOVE) IMPLANT
GLOVE BIO SURGEON STRL SZ7 (GLOVE) IMPLANT
GLOVE BIOGEL PI IND STRL 7.0 (GLOVE) IMPLANT
GLOVE BIOGEL PI IND STRL 8 (GLOVE) ×1 IMPLANT
GLOVE SURG ORTHO 8.0 STRL STRW (GLOVE) ×2 IMPLANT
GOWN STRL REUS W/ TWL XL LVL3 (GOWN DISPOSABLE) ×1 IMPLANT
GOWN STRL REUS W/TWL XL LVL3 (GOWN DISPOSABLE) ×1
HANDPIECE INTERPULSE COAX TIP (DISPOSABLE) ×1
HDLS TROCR DRIL PIN KNEE 75 (PIN) ×1
HOLDER FOLEY CATH W/STRAP (MISCELLANEOUS) ×1 IMPLANT
HOOD PEEL AWAY FLYTE STAYCOOL (MISCELLANEOUS) ×3 IMPLANT
LINER TIB PS CD/8-9 10 LT (Liner) IMPLANT
MANIFOLD NEPTUNE II (INSTRUMENTS) ×1 IMPLANT
MARKER SKIN DUAL TIP RULER LAB (MISCELLANEOUS) ×1 IMPLANT
NS IRRIG 1000ML POUR BTL (IV SOLUTION) ×1 IMPLANT
PACK TOTAL KNEE CUSTOM (KITS) ×1 IMPLANT
PIN DRILL HDLS TROCAR 75 4PK (PIN) IMPLANT
PROTECTOR NERVE ULNAR (MISCELLANEOUS) ×1 IMPLANT
SCREW HEADED 33MM KNEE (MISCELLANEOUS) IMPLANT
SET HNDPC FAN SPRY TIP SCT (DISPOSABLE) ×1 IMPLANT
SOLUTION IRRIG SURGIPHOR (IV SOLUTION) IMPLANT
SPIKE FLUID TRANSFER (MISCELLANEOUS) ×1 IMPLANT
STEM POLY PAT PLY 29M KNEE (Knees) IMPLANT
STEM TIB ST PERS 14+30 (Stem) IMPLANT
STEM TIBIA 5 DEG SZ D L KNEE (Knees) IMPLANT
STRIP CLOSURE SKIN 1/2X4 (GAUZE/BANDAGES/DRESSINGS) ×1 IMPLANT
SUT MNCRL AB 3-0 PS2 18 (SUTURE) ×1 IMPLANT
SUT STRATAFIX 0 PDS 27 VIOLET (SUTURE) ×1
SUT STRATAFIX PDO 1 14 VIOLET (SUTURE) ×1
SUT STRATFX PDO 1 14 VIOLET (SUTURE) ×1
SUT VIC AB 1 CT1 36 (SUTURE) IMPLANT
SUT VIC AB 2-0 CT2 27 (SUTURE) ×2 IMPLANT
SUTURE STRATFX 0 PDS 27 VIOLET (SUTURE) ×1 IMPLANT
SUTURE STRATFX PDO 1 14 VIOLET (SUTURE) ×1 IMPLANT
SYR 50ML LL SCALE MARK (SYRINGE) ×1 IMPLANT
TIBIA STEM 5 DEG SZ D L KNEE (Knees) ×1 IMPLANT
TRAY FOLEY MTR SLVR 14FR STAT (SET/KITS/TRAYS/PACK) IMPLANT
TUBE SUCTION HIGH CAP CLEAR NV (SUCTIONS) ×1 IMPLANT
UNDERPAD 30X36 HEAVY ABSORB (UNDERPADS AND DIAPERS) ×1 IMPLANT
WRAP KNEE MAXI GEL POST OP (GAUZE/BANDAGES/DRESSINGS) IMPLANT

## 2022-07-22 NOTE — Plan of Care (Signed)
  Problem: Education: Goal: Knowledge of the prescribed therapeutic regimen will improve Outcome: Progressing   Problem: Pain Management: Goal: Pain level will decrease with appropriate interventions Outcome: Progressing   Problem: Activity: Goal: Risk for activity intolerance will decrease Outcome: Progressing   

## 2022-07-22 NOTE — Transfer of Care (Signed)
Immediate Anesthesia Transfer of Care Note  Patient: [name]  Procedure(s) Performed: TOTAL KNEE ARTHROPLASTY (Left: Knee)  Patient Location: PACU  Anesthesia Type:Spinal  Level of Consciousness: awake, oriented and patient cooperative  Airway & Oxygen Therapy: Patient Spontanous Breathing and Patient connected to face mask oxygen  Post-op Assessment: Report given to RN and Post -op Vital signs reviewed and stable  Post vital signs: Reviewed and stable  Last Vitals:  Vitals Value Taken Time  BP 120/85 07/22/22 1238  Temp    Pulse 63 07/22/22 1238  Resp 14 07/22/22 1241  SpO2 89 % 07/22/22 1238  Vitals shown include unvalidated device data.  Last Pain:  Vitals:   07/22/22 0832  TempSrc: Oral         Complications: No notable events documented.

## 2022-07-22 NOTE — Anesthesia Procedure Notes (Signed)
Anesthesia Regional Block: Adductor canal block   Pre-Anesthetic Checklist: , timeout performed,  Correct Patient, Correct Site, Correct Laterality,  Correct Procedure, Correct Position, site marked,  Risks and benefits discussed,  Surgical consent,  Pre-op evaluation,  At surgeon's request and post-op pain management  Laterality: Right and Left  Prep: chloraprep       Needles:  Injection technique: Single-shot  Needle Type: Echogenic Stimulator Needle     Needle Length: 10cm  Needle Gauge: 21   Needle insertion depth: 7 cm   Additional Needles:   Procedures:,,,, ultrasound used (permanent image in chart),,    Narrative:  Start time: 07/22/2022 9:35 AM End time: 07/22/2022 9:40 AM Injection made incrementally with aspirations every 5 mL.  Performed by: Personally  Anesthesiologist: Josephine Igo, MD  Additional Notes: Timeout performed. Patient sedated. Relevant anatomy ID'd using [place]. Incremental 2-6m injection of LA with frequent aspiration. Patient tolerated procedure well.     Left Adductor Canal Block

## 2022-07-22 NOTE — Anesthesia Procedure Notes (Signed)
Spinal  Patient location during procedure: OR Reason for block: surgical anesthesia Staffing Performed: anesthesiologist and other anesthesia staff  Resident/CRNA: Garrel Ridgel, CRNA Performed by: Garrel Ridgel, CRNA Authorized by: Josephine Igo, MD   Preanesthetic Checklist Completed: patient identified, IV checked, site marked, risks and benefits discussed, surgical consent, monitors and equipment checked, pre-op evaluation and timeout performed Spinal Block Patient position: sitting Prep: Betadine and ChloraPrep Patient monitoring: heart rate, continuous pulse ox and blood pressure Location: L3-4 Injection technique: single-shot Needle Needle type: Pencan  Needle gauge: 24 G Needle length: 9 cm Needle insertion depth: 6 cm Assessment Sensory level: T10 Events: CSF return Additional Notes

## 2022-07-22 NOTE — Progress Notes (Signed)
Orthopedic Tech Progress Note Patient Details:  [name] 02-Sep-1946 [id number]  Ortho Devices Type of Ortho Device: Bone foam zero knee Ortho Device/Splint Interventions: Ordered     Bone foam dropped off in PACU with patient and her belongings. [name] 07/22/2022, 1:50 PM

## 2022-07-22 NOTE — Anesthesia Postprocedure Evaluation (Signed)
Anesthesia Post Note  Patient: [name]  Procedure(s) Performed: TOTAL KNEE ARTHROPLASTY (Left: Knee)     Patient location during evaluation: PACU Anesthesia Type: Spinal Level of consciousness: awake and alert Pain management: pain level controlled Vital Signs Assessment: post-procedure vital signs reviewed and stable Respiratory status: spontaneous breathing and respiratory function stable Cardiovascular status: blood pressure returned to baseline and stable Postop Assessment: spinal receding Anesthetic complications: no   No notable events documented.  Last Vitals:  Vitals:   07/22/22 1300 07/22/22 1315  BP: (!) 144/53 133/89  Pulse: 70 66  Resp: 14 12  Temp:  36.6 C  SpO2: 99% 100%    Last Pain:  Vitals:   07/22/22 1315  TempSrc:   PainSc: 0-No pain                 Aryn Kops DANIEL

## 2022-07-22 NOTE — Interval H&P Note (Signed)
The patient has been re-examined, and the chart reviewed, and there have been no interval changes to the documented history and physical.    Plan for L TKA for L knee OA  The operative side was examined and the patient was confirmed to have sensation to DPN, SPN, TN intact, Motor EHL, ext, flex 5/5, and DP 2+, PT 2+, No significant edema.   The risks, benefits, and alternatives have been discussed at length with patient, and the patient is willing to proceed.  Left knee marked. Consent has been signed.  

## 2022-07-22 NOTE — Op Note (Signed)
DATE OF SURGERY:  07/22/2022 TIME: 12:15 PM  PATIENT NAME:  [name]   AGE: 76 y.o.    PRE-OPERATIVE DIAGNOSIS:  End-stage left knee osteoarthritis with valgus deformity  POST-OPERATIVE DIAGNOSIS:  Same  PROCEDURE:  Left Total Knee Arthroplasty  SURGEON:  Torie Priebe A Niv Darley, MD   ASSISTANT: Izola Price, RNFA, present and scrubbed throughout the case, critical for assistance with exposure, retraction, instrumentation, and closure.   OPERATIVE IMPLANTS:  Biomet Zimmer persona left size 8 CR narrow femur, D tibia, 29 mm patella, 10 mm MC poly, 30 mm stem extension on the tibia Implant Name Type Inv. Item Serial No. Manufacturer Lot No. LRB No. Used Action  CEMENT BONE R 1X40 - [id number] Cement CEMENT BONE R 1X40  ZIMMER RECON(ORTH,TRAU,BIO,SG) [id number] Left 2 Implanted  STEM TIB ST PERS 14+30 - [id number] Stem STEM TIB ST PERS 14+30  ZIMMER RECON(ORTH,TRAU,BIO,SG) [id number] Left 1 Implanted  STEM POLY PAT PLY 19M KNEE - [id number] Knees STEM POLY PAT PLY 19M KNEE  ZIMMER RECON(ORTH,TRAU,BIO,SG) [id number] Left 1 Implanted  COMP FEM CMT CR PERS SZ8 LT - [id number] Joint COMP FEM CMT CR PERS SZ8 LT  ZIMMER RECON(ORTH,TRAU,BIO,SG) [id number] Left 1 Implanted  TIBIA STEM 5 DEG SZ D L KNEE - [id number] Knees TIBIA STEM 5 DEG SZ D L KNEE  ZIMMER RECON(ORTH,TRAU,BIO,SG) [id number] Left 1 Implanted  persona vivacil polyethylene articular surface`    ZIMMER KNEE [id number] Left 1 Implanted      PREOPERATIVE INDICATIONS:  [name] is a 76 y.o. year old female with end stage bone on bone degenerative arthritis of the knee who failed conservative treatment, including injections, antiinflammatories, activity modification, and assistive devices, and had significant impairment of their activities of daily living, and elected for Total Knee Arthroplasty.   The risks, benefits, and alternatives were discussed at length including but not limited to the risks of infection, bleeding, nerve injury,  stiffness, blood clots, the need for revision surgery, cardiopulmonary complications, among others, and they were willing to proceed.  OPERATIVE FINDINGS AND UNIQUE ASPECTS OF THE CASE: Very soft bone on the tibia, given this and relative obesity, elected to stem extension for better fixation  ESTIMATED BLOOD LOSS: 50 cc  OPERATIVE DESCRIPTION:   Once adequate anesthesia was induced, preoperative antibiotics, 2 gm of Ancef,1 gm of Tranexamic Acid, and 8 mg of Decadron administered, the patient was positioned supine with a left thigh tourniquet placed.  The left lower extremity was prepped and draped in sterile fashion.  A time-  out was performed identifying the patient, planned procedure, and the appropriate extremity.     The leg was  exsanguinated, tourniquet elevated to 250 mmHg.  A midline incision was  made followed by median parapatellar arthrotomy. Anterior horn of the medial meniscus was released and resected. A medial release was performed, the infrapatellar fat pad was resected with care taken to protect the patellar tendon. The suprapatellar fat was removed to exposed the distal anterior femur. The anterior horn of the lateral meniscus and ACL were released.    Following initial  exposure, I first started with the femur  The femoral  canal was opened with a drill, canal was suctioned to try to prevent fat emboli.  An  intramedullary rod was passed set at 5 degrees valgus, 10 mm. The distal femur was resected.  Following this resection, the tibia was  subluxated anteriorly.  Using the extramedullary guide, 10 mm of bone was resected off   the proximal lateral tibia.  We confirmed the gap would be  stable medially and laterally with a size 38m spacer block as well as confirmed that the tibial cut was perpendicular in the coronal plane, checking with an alignment rod.    Once this was done, the posterior femoral referencing femoral sizer was placed under to the posterior condyles with 3  degrees of external rotational which was parallel to the transepicondylar axis and perpendicular to [name] The femur was sized to be a size 8 in the anterior-  posterior dimension. The  anterior, posterior, and  chamfer cuts were made without difficulty nor   notching making certain that I was along the anterior cortex to help  with flexion gap stability. Next a laminar spreader was placed with the knee in flexion and the medial lateral menisci were resected.  5 cc of the Exparel mixture was injected in the medial side of the back of the knee and 3 cc in the lateral side.  1/2 inch curved osteotome was used to resect posterior osteophyte that was then removed with a pituitary rongeur. \The knee was relatively tight or laterally in flexion and extension.  In flexion elected to fractionally lengthen the popliteus using Bovie cautery to help increase the flexion space.      At this point, the tibia was sized to be a size D.  The size D tray was  then pinned in position. Trial reduction was now carried with a 8 femur, D tibia, a 10 mm MC insert.  The knee had full extension and was stable to varus valgus stress in extension.  The knee was slightly tight in flexion and the PCL was partially released.   Attention was next directed to the patella.  Precut  measurement was noted to be 21 mm.  I resected down to 13 mm and used a  264mpatellar button to restore patellar height as well as cover the cut surface.     The patella lug holes were drilled and a 29 mm patella poly trial was placed.    The knee was brought to full extension with good flexion stability with the patella tracking through the trochlea without application of pressure.     Next the femoral component was again assessed and determined to be seated and appropriately lateralized.  The femoral lug holes were drilled.  The femoral component was then removed. Tibial component was again assessed and felt to be seated and appropriately  rotated with the medial third of the tubercle. The tibia was then drilled, and keel punched.     Final components were  opened and regular cement was mixed.      Final implants were then  cemented onto cleaned and dried cut surfaces of bone with the knee brought to extension with a 10 mm MC poly.  The knee was irrigated with sterile Betadine diluted in saline as well as pulse lavage normal saline. The synovial lining was  then injected a dilute Exparel.      Once the cement had fully cured, excess cement was removed throughout the knee.  I confirmed that I was satisfied with the range of motion and stability, and the final 10 mm MC poly insert was chosen.  It was placed into the knee.         The tourniquet had been let down.  No significant hemostasis was required.  The medial parapatellar arthrotomy was then reapproximated using #1 Vicryl and #1 Stratafix sutures with the knee  in flexion.  The remaining wound was closed with 0 stratafix, 2-0 Vicryl, and running 3-0 Monocryl. The knee was cleaned, dried, dressed sterilely using Dermabond and   Aquacel dressing.  The patient was then brought to recovery room in stable condition, tolerating the procedure  well. There were no complications.   Post op recs: WB: WBAT Abx: ancef Imaging: PACU xrays DVT prophylaxis: Aspirin '81mg'$  BID x4 weeks Follow up: 2 weeks after surgery for a wound check with Dr. Zachery Dakins at [facility].  Address: [address], [place], [place] [address]  Office Phone: [phone]  Charlies Constable, MD Orthopaedic Surgery

## 2022-07-22 NOTE — Progress Notes (Signed)
PT Cancellation Note  Patient Details Name: [name] MRN: [id number] DOB: 07/17/1946   Cancelled Treatment:    Reason Eval/Treat Not Completed: (P) Medical issues which prohibited therapy: pt unable to actively PF, DF, perform SLR, or activate gluteals; sensation still reduced bilaterally. Attempted twice, once at 1650 and again at 1800, no noticeable change in that time. Unsafe to perform physical therapy evaluation at this time. Directed pt to continue to attempt ankle pumps and perform as able hourly tonight. We we will attempt full eval tomorrow. Pt and husband verbalized understanding.  Coolidge Breeze, PT, DPT [facility] Rehabilitation Department Office: [phone]  Coolidge Breeze 07/22/2022, 6:41 PM

## 2022-07-22 NOTE — Anesthesia Preprocedure Evaluation (Signed)
Anesthesia Evaluation  Patient identified by MRN, date of birth, ID band Patient awake    Reviewed: Allergy & Precautions, NPO status , Patient's Chart, lab work & pertinent test results  Airway Mallampati: II  TM Distance: >3 FB Neck ROM: Full    Dental  (+) Teeth Intact, Dental Advisory Given   Pulmonary asthma , pneumonia, resolved,    Pulmonary exam normal breath sounds clear to auscultation       Cardiovascular negative cardio ROS Normal cardiovascular exam+ Valvular Problems/Murmurs  Rhythm:Regular Rate:Normal  EKG 07/18/22 NSR, RBBB pattern   Neuro/Psych  Headaches, PSYCHIATRIC DISORDERS Depression CVA, No Residual Symptoms    GI/Hepatic Neg liver ROS, GERD  Medicated,  Endo/Other  Obesity  Renal/GU Hx/o renal calculi Bladder dysfunction      Musculoskeletal  (+) Arthritis , Osteoarthritis,  OA left knee   Abdominal (+) + obese,   Peds  Hematology  (+) Blood dyscrasia, anemia ,   Anesthesia Other Findings   Reproductive/Obstetrics                             Anesthesia Physical Anesthesia Plan  ASA: 2  Anesthesia Plan: Spinal   Post-op Pain Management: Regional block* and Minimal or no pain anticipated   Induction: Intravenous  PONV Risk Score and Plan: 3 and Treatment may vary due to age or medical condition and Propofol infusion  Airway Management Planned: Natural Airway, Nasal Cannula and Simple Face Mask  Additional Equipment: None  Intra-op Plan:   Post-operative Plan:   Informed Consent: I have reviewed the patients History and Physical, chart, labs and discussed the procedure including the risks, benefits and alternatives for the proposed anesthesia with the patient or authorized representative who has indicated his/her understanding and acceptance.     Dental advisory given  Plan Discussed with: CRNA and Anesthesiologist  Anesthesia Plan Comments:          Anesthesia Quick Evaluation

## 2022-07-22 NOTE — Discharge Instructions (Addendum)

## 2022-07-23 ENCOUNTER — Encounter (HOSPITAL_COMMUNITY): Payer: Self-pay | Admitting: Orthopedic Surgery

## 2022-07-23 LAB — CBC
HCT: 35.6 % — ABNORMAL LOW (ref 36.0–46.0)
Hemoglobin: 11.3 g/dL — ABNORMAL LOW (ref 12.0–15.0)
MCH: 31.4 pg (ref 26.0–34.0)
MCHC: 31.7 g/dL (ref 30.0–36.0)
MCV: 98.9 fL (ref 80.0–100.0)
Platelets: 178 10*3/uL (ref 150–400)
RBC: 3.6 MIL/uL — ABNORMAL LOW (ref 3.87–5.11)
RDW: 12.7 % (ref 11.5–15.5)
WBC: 10.4 10*3/uL (ref 4.0–10.5)
nRBC: 0 % (ref 0.0–0.2)

## 2022-07-23 LAB — BASIC METABOLIC PANEL
Anion gap: 6 (ref 5–15)
BUN: 16 mg/dL (ref 8–23)
CO2: 28 mmol/L (ref 22–32)
Calcium: 8.8 mg/dL — ABNORMAL LOW (ref 8.9–10.3)
Chloride: 102 mmol/L (ref 98–111)
Creatinine, Ser: 0.78 mg/dL (ref 0.44–1.00)
GFR, Estimated: >60 mL/min (ref >60)
Glucose, Bld: 142 mg/dL — ABNORMAL HIGH (ref 70–99)
Potassium: 4 mmol/L (ref 3.5–5.1)
Sodium: 136 mmol/L (ref 135–145)

## 2022-07-23 NOTE — Progress Notes (Signed)
Physical Therapy Treatment Patient Details Name: [name] MRN: [id number] DOB: 12-Jul-1946 Today's Date: 07/23/2022   History of Present Illness Pt is a 76yo female presenting s/p L-TKA on 07/22/22. PMH: stroke, R-RCR, HLD, ankle fx/surgery, cognitive change    PT Comments    Pt continues to progress slowly. With +2 mod/max assist pt able to stand and pivot to chair. Incr time needed for all basic function tasks, decr initiation, slow processing,  rigid throughout trunk/extremities. Pt will likely continue pattern of slow progression given deconditioning, poor balance and issues noted above.   Recommend SNF unless family able to provide 24 hour care and  maximum physical assistance.   Recommendations for follow up therapy are one component of a multi-disciplinary discharge planning process, led by the attending physician.  Recommendations may be updated based on patient status, additional functional criteria and insurance authorization.  Follow Up Recommendations  Skilled nursing-short term rehab (<3 hours/day) Can patient physically be transported by private vehicle: No   Assistance Recommended at Discharge Frequent or constant Supervision/Assistance  Patient can return home with the following Assistance with cooking/housework;Assist for transportation;Help with stairs or ramp for entrance;Direct supervision/assist for financial management;Direct supervision/assist for medications management;Two people to help with bathing/dressing/bathroom;Two people to help with walking and/or transfers   Equipment Recommendations  None recommended by PT    Recommendations for Other Services       Precautions / Restrictions Precautions Precautions: Fall;Knee Restrictions Weight Bearing Restrictions: No Other Position/Activity Restrictions: WBAT     Mobility  Bed Mobility Overal bed mobility: Needs Assistance Bed Mobility: Supine to Sit     Supine to sit: Max assist, +2 for  safety/equipment, +2 for physical assistance Sit to supine: Total assist, +2 for physical assistance, +2 for safety/equipment   General bed mobility comments: pt requiring assist for LEs and trunk in both directions.requires  incr time, limited pt effort, decr initiation. step by step cues    Transfers Overall transfer level: Needs assistance Equipment used: Rolling walker (2 wheels) Transfers: Sit to/from Stand, Bed to chair/wheelchair/BSC Sit to Stand: +2 physical assistance, +2 safety/equipment, Max assist, From elevated surface, Mod assist           General transfer comment: STS from elevated bed; requiring assist of 2 to come to standing with multi-modal cues rocking and counting to improve initiation and assist with anterior-superior wt shift;  trunk/hip and knees flexed; multi-modal cues and assist to shift wt for stand pivot to chair, incr time    Ambulation/Gait               General Gait Details: unable, dizzy with mobility and unable to wt shift to transfer to chair   Stairs             Wheelchair Mobility    Modified Rankin (Stroke Patients Only)       Balance Overall balance assessment: Needs assistance Sitting-balance support: Single extremity supported, Feet supported Sitting balance-Leahy Scale: Poor     Standing balance support: Reliant on assistive device for balance, During functional activity, Bilateral upper extremity supported Standing balance-Leahy Scale: Zero--posterior LOB with pt unaware--feels she is falling forward Standing balance comment: reliant on UEs and external support                            Cognition Arousal/Alertness: Awake/alert Behavior During Therapy: Flat affect Overall Cognitive Status: No family/caregiver present to determine baseline cognitive functioning Area of Impairment: Following  commands, Memory, Attention, Problem solving                   Current Attention Level: Focused Memory:  Decreased short-term memory Following Commands: Follows one step commands with increased time, Follows multi-step commands inconsistently     Problem Solving: Slow processing, Decreased initiation, Difficulty sequencing, Requires verbal cues, Requires tactile cues General Comments: "monday at [place]"        Exercises Other Exercises Other Exercises: knee flexion in sitting, limited tol R or L knee d/t pain    General Comments        Pertinent Vitals/Pain Pain Assessment Pain Assessment: 0-10 Pain Score: 5  Pain Location: L knee Pain Descriptors / Indicators: Operative site guarding, Sore Pain Intervention(s): Limited activity within patient's tolerance, Monitored during session, Premedicated before session, Repositioned    Home Living                          Prior Function            PT Goals (current goals can now be found in the care plan section) Acute Rehab PT Goals Patient Stated Goal: none stated PT Goal Formulation: With patient Time For Goal Achievement: 07/30/22 Potential to Achieve Goals: Good Progress towards PT goals: Progressing toward goals    Frequency    7X/week      PT Plan Current plan remains appropriate    Co-evaluation              AM-PAC PT "6 Clicks" Mobility   Outcome Measure  Help needed turning from your back to your side while in a flat bed without using bedrails?: Total Help needed moving from lying on your back to sitting on the side of a flat bed without using bedrails?: Total Help needed moving to and from a bed to a chair (including a wheelchair)?: Total Help needed standing up from a chair using your arms (e.g., wheelchair or bedside chair)?: Total Help needed to walk in hospital room?: Total Help needed climbing 3-5 steps with a railing? : Total 6 Click Score: 6    End of Session Equipment Utilized During Treatment: Gait belt Activity Tolerance: Patient limited by fatigue Patient left: in  chair;with call bell/phone within reach;with chair alarm set Nurse Communication: Mobility status PT Visit Diagnosis: Other abnormalities of gait and mobility (R26.89);Muscle weakness (generalized) (M62.81);Other symptoms and signs involving the nervous system (R29.898)     Time: 7893-8101 PT Time Calculation (min) (ACUTE ONLY): 31 min  Charges:  $Therapeutic Activity: 23-37 mins                     Baxter Flattery, PT  Acute Rehab Dept [facility]) [phone]  [facility] Weekend Pager [facility] only)  [phone]  07/23/2022    [facility] 07/23/2022, 3:34 PM

## 2022-07-23 NOTE — Progress Notes (Signed)
     Subjective:  Patient reports pain as mild.   Was unable to participate with therapy yesterday thought to have some residual weakness from the spinal still.  Denies distal numbness and tingling this morning.  Objective:   VITALS:   Vitals:   07/22/22 1745 07/22/22 2122 07/23/22 0222 07/23/22 0424  BP: (!) 142/78 116/67 117/70 122/65  Pulse: (!) 106 100 (!) 103 96  Resp: '16 14 18 18  '$ Temp: 98.3 F (36.8 C) (!) 97.5 F (36.4 C) 98.2 F (36.8 C) 98.6 F (37 C)  TempSrc: Oral Oral Oral Oral  SpO2: 99% 99% 90% 93%  Weight:      Height:        Sensation intact distally Intact pulses distally Dorsiflexion/Plantar flexion intact Incision: dressing C/D/I Compartment soft   Lab Results  Component Value Date   WBC 10.4 07/23/2022   HGB 11.3 (L) 07/23/2022   HCT 35.6 (L) 07/23/2022   MCV 98.9 07/23/2022   PLT 178 07/23/2022   BMET    Component Value Date/Time   NA 136 07/23/2022 0313   NA 140 05/27/2017 1553   K 4.0 07/23/2022 0313   CL 102 07/23/2022 0313   CO2 28 07/23/2022 0313   GLUCOSE 142 (H) 07/23/2022 0313   BUN 16 07/23/2022 0313   BUN 15 05/27/2017 1553   CREATININE 0.78 07/23/2022 0313   CALCIUM 8.8 (L) 07/23/2022 0313   GFRNONAA >60 07/23/2022 0313    Xray: Left total knee arthroplasty components good position no adverse features  Assessment/Plan: 1 Day Post-Op   Principal Problem:   Localized osteoarthritis of left knee  Status post left total knee arthroplasty 07/22/2022  Post op recs: WB: WBAT Abx: ancef Imaging: PACU xrays DVT prophylaxis: Aspirin '81mg'$  BID x4 weeks Follow up: 2 weeks after surgery for a wound check with Dr. Zachery Dakins at [facility].  Address: [address] [place], [place], [place] [address]  Office Phone: [phone]      [name] 07/23/2022, 6:28 AM   Charlies Constable, MD  Contact information:   [phone] 7am-5pm epic message Dr. Zachery Dakins, or call office for patient follow up:  [phone] After hours and holidays please check [email or web address] for group call information for [facility]

## 2022-07-23 NOTE — NC FL2 (Signed)
[place] LEVEL OF CARE SCREENING TOOL     IDENTIFICATION  Patient Name: [name] Birthdate: 1946/05/30 Sex: female Admission Date (Current Location): 07/22/2022  [facility] and [place] Number:  Engineer, manufacturing systems and Address:  [facility],  [place], [place]      Provider Number: [id number]  Attending Physician Name and Address:  [name], MD  Relative Name and Phone Number:  Eldred, Lievanos [phone]  [phone]    Current Level of Care: Hospital Recommended Level of Care: [place] Prior Approval Number:    Date Approved/Denied:   PASRR Number: [id number]  Discharge Plan: SNF    Current Diagnoses: Patient Active Problem List   Diagnosis Date Noted   Localized osteoarthritis of left knee 07/22/2022   S/P hysterectomy 01/30/2021   Visit for pelvic exam 01/30/2021   Chronic constipation 01/30/2021   SUI (stress urinary incontinence, female) 01/30/2021   Low back pain without sciatica 05/11/2019   Arthritis 08/01/2018   Knee pain, chronic 08/01/2018   CVA (cerebral vascular accident) ([place]) 08/01/2018   Hyperlipidemia 08/01/2018   Migraines 08/01/2018   Mechanical ptosis of bilateral eyelids 03/28/2017   Ptosis of eyelid, bilateral 03/28/2017   Nasal septal perforation 09/12/2016   Presbycusis of both ears 09/12/2016   Cognitive change 08/27/2016   Abnormal mammogram 09/07/2014   Allergic rhinitis 09/07/2014   Allergic urticaria 09/07/2014   Amaurosis fugax 09/07/2014   Asthma 09/07/2014   Benign neoplasm of colon 09/07/2014   Esophageal reflux 09/07/2014   Lesion of breast 09/07/2014   Sensorineural hearing loss (SNHL), bilateral 09/07/2014   Tinnitus 09/07/2014   Neck pain, chronic 01/13/2014   Baker's cyst of knee 12/09/2013   Stiffness of joint, not elsewhere classified, other specified site 03/02/2013   Weakness of neck 03/02/2013    Orientation RESPIRATION BLADDER  Height & Weight     Self, Time, Place, Situation  Normal Continent Weight: 196 lb (88.9 kg) Height:  '5\' 5"'$  (165.1 cm)  BEHAVIORAL SYMPTOMS/MOOD NEUROLOGICAL BOWEL NUTRITION STATUS      Continent Diet (Regular diet)  AMBULATORY STATUS COMMUNICATION OF NEEDS Skin   Limited Assist Verbally Surgical wounds                       Personal Care Assistance Level of Assistance  Bathing, Dressing, Feeding Bathing Assistance: Limited assistance Feeding assistance: Independent Dressing Assistance: Limited assistance     Functional Limitations Info  Sight, Speech, Hearing Sight Info: Adequate Hearing Info: Adequate Speech Info: Adequate    SPECIAL CARE FACTORS FREQUENCY  PT (By licensed PT), OT (By licensed OT)     PT Frequency: Minimum 5x a week OT Frequency: Minimum 5x a week            Contractures Contractures Info: Not present    Additional Factors Info  Code Status, Allergies, Psychotropic Code Status Info: Full Code Allergies Info: Ticlid (Ticlopidine)   Nsaids   Prednisone   Monistat (Miconazole)   Sulfa Antibiotics Psychotropic Info: ARIPiprazole (ABILIFY) tablet 5 mg, busPIRone (BUSPAR) tablet 30 mg,         Current Medications (07/23/2022):  This is the current hospital active medication list Current Facility-Administered Medications  Medication Dose Route Frequency Provider Last Rate Last Admin   acetaminophen (TYLENOL) tablet 325-650 mg  325-650 mg Oral Q6H PRN [name], MD       ARIPiprazole (ABILIFY) tablet 5 mg  5 mg Oral Daily Marchwiany,  Virgina Norfolk, MD   5 mg at 07/23/22 2505   aspirin chewable tablet 81 mg  81 mg Oral BID [name], MD   81 mg at 07/23/22 0956   busPIRone (BUSPAR) tablet 30 mg  30 mg Oral BID [name], MD   30 mg at 07/23/22 3976   cholecalciferol (VITAMIN D3) 25 MCG (1000 UNIT) tablet 5,000 Units  5,000 Units Oral Daily [name], MD   5,000 Units at 07/23/22 7341   diphenhydrAMINE (BENADRYL)  12.5 MG/5ML elixir 12.5-25 mg  12.5-25 mg Oral Q4H PRN [name], MD       docusate sodium (COLACE) capsule 100 mg  100 mg Oral BID [name], MD   100 mg at 07/23/22 0956   DULoxetine (CYMBALTA) DR capsule 120 mg  120 mg Oral QHS [name], MD   120 mg at 07/22/22 2125   HYDROmorphone (DILAUDID) injection 0.5-1 mg  0.5-1 mg Intravenous Q4H PRN [name], MD   0.5 mg at 07/22/22 1747   lactated ringers infusion   Intravenous Continuous [name], MD 75 mL/hr at 07/22/22 1426 New Bag at 07/22/22 1426   LORazepam (ATIVAN) tablet 0.25-0.5 mg  0.25-0.5 mg Oral BID PRN [name], MD   0.5 mg at 07/23/22 1218   menthol-cetylpyridinium (CEPACOL) lozenge 3 mg  1 lozenge Oral PRN [name], MD       Or   phenol (CHLORASEPTIC) mouth spray 1 spray  1 spray Mouth/Throat PRN [name], MD       mirabegron ER (MYRBETRIQ) tablet 50 mg  50 mg Oral Daily [name], MD   50 mg at 07/23/22 0956   ondansetron (ZOFRAN) tablet 4 mg  4 mg Oral Q6H PRN [name], MD       Or   ondansetron (ZOFRAN) injection 4 mg  4 mg Intravenous Q6H PRN [name], MD       oxyCODONE (Oxy IR/ROXICODONE) immediate release tablet 5-10 mg  5-10 mg Oral Q4H PRN [name], MD   10 mg at 07/23/22 0956   pantoprazole (PROTONIX) EC tablet 40 mg  40 mg Oral Daily [name], MD   40 mg at 07/22/22 1739   polyethylene glycol (MIRALAX / GLYCOLAX) packet 17 g  17 g Oral Daily PRN [name], MD       valbenazine [facility]) capsule 80 mg  80 mg Oral QHS [name], MD   80 mg at 07/22/22 2125   zolpidem (AMBIEN) tablet 5 mg  5 mg Oral QHS PRN [name], MD         Discharge Medications: Please see discharge summary for a list of discharge medications.  Relevant Imaging Results:  Relevant Lab Results:   Additional Information SSN [id number]  Ross Ludwig,  LCSW

## 2022-07-23 NOTE — TOC Initial Note (Addendum)
Transition of Care [facility]) - Initial/Assessment Note    Patient Details  Name: [name] MRN: [id number] Date of Birth: [birth year]  Transition of Care [facility]) CM/SW Contact:    Ross Ludwig, LCSW Phone Number: 07/23/2022, 4:44 PM  Clinical Narrative:                  Patient is a 76 year old female who is married and lives with her husband.  Patient is alert and oriented x4.  Patient is a bundled ortho procedure and has home health PT and OT set up with [facility] which was prearranged at physician's office.  PT worked with patient and recommended SNF placement.  CSW informed PT that it is an ortho bundle patient and it will depend on if ortho will agree to it.  CSW contacted Renee Union via text at [phone] and informed her of what PT recommendations are.  Renee stated she will speak to the physician and patient's husband to see if SNF is a possibility.  CSW informed her that patient has [employer] and will need insurance authorization for a SNF before patient can go to facility.  Per Joseph Art, she said to go ahead and send information to SNFs in case physician is in agreement to SNF placement.  CSW faxed patient's information to SNFs in [place], per Joseph Art they would prefer [facility] if possible.  2:56pm  CSW received text message back from [place], she spoke to physician and patient's husband they would like to avoid SNF if possible.  Per Joseph Art, physician would like to try to stop all sedating medications that they can and try to treat pain with Tylenol.  Per Joseph Art, husband states he has help at home with their son and daughter.  Per Joseph Art, physician wants to see how patient is doing tomorrow.  CSW updated PT working with patient to make her aware of situation.  TOC to continue to follow patient's progress throughout discharge planning.  Expected Discharge Plan: [place] Barriers to Discharge: Continued Medical Work up   Patient Goals  and CMS Choice Patient states their goals for this hospitalization and ongoing recovery are:: To return back home with home health.      Expected Discharge Plan and Services Expected Discharge Plan: [place] Choice: [place] arrangements for the past 2 months: Single Family Home                           HH Arranged: PT, OT HH Agency: [facility]        Prior Living Arrangements/Services Living arrangements for the past 2 months: Single Family Home Lives with:: Spouse Patient language and need for interpreter reviewed:: Yes Do you feel safe going back to the place where you live?: Yes      Need for Family Participation in Patient Care: Yes (Comment) Care giver support system in place?: No (comment)   Criminal Activity/Legal Involvement Pertinent to Current Situation/Hospitalization: No - Comment as needed  Activities of Daily Living Home Assistive Devices/Equipment: Cane (specify quad or straight), Raised toilet seat with rails, Walker (specify type) ADL Screening (condition at time of admission) Patient's cognitive ability adequate to safely complete daily activities?: Yes Is the patient deaf or have difficulty hearing?: Yes Does the patient have difficulty seeing, even when wearing glasses/contacts?: No Does the patient have difficulty concentrating, remembering,  or making decisions?: No Patient able to express need for assistance with ADLs?: Yes Does the patient have difficulty dressing or bathing?: No Independently performs ADLs?: Yes (appropriate for developmental age) Does the patient have difficulty walking or climbing stairs?: Yes Weakness of Legs: Left Weakness of Arms/Hands: None  Permission Sought/Granted Permission sought to share information with : Case Manager, Customer service manager, Family Supports Permission granted to share information with : Yes, Verbal Permission Granted  Share  Information with NAME: Annelie, Boak [phone]  [phone]  Permission granted to share info w AGENCY: SNF admissions        Emotional Assessment Appearance:: Appears stated age Attitude/Demeanor/Rapport: Engaged Affect (typically observed): Accepting, Appropriate, Calm Orientation: : Oriented to Self, Oriented to Place, Oriented to  Time, Oriented to Situation Alcohol / Substance Use: Not Applicable Psych Involvement: No (comment)  Admission diagnosis:  Localized osteoarthritis of left knee [M17.12] Patient Active Problem List   Diagnosis Date Noted   Localized osteoarthritis of left knee 07/22/2022   S/P hysterectomy 01/30/2021   Visit for pelvic exam 01/30/2021   Chronic constipation 01/30/2021   SUI (stress urinary incontinence, female) 01/30/2021   Low back pain without sciatica 05/11/2019   Arthritis 08/01/2018   Knee pain, chronic 08/01/2018   CVA (cerebral vascular accident) ([place]) 08/01/2018   Hyperlipidemia 08/01/2018   Migraines 08/01/2018   Mechanical ptosis of bilateral eyelids 03/28/2017   Ptosis of eyelid, bilateral 03/28/2017   Nasal septal perforation 09/12/2016   Presbycusis of both ears 09/12/2016   Cognitive change 08/27/2016   Abnormal mammogram 09/07/2014   Allergic rhinitis 09/07/2014   Allergic urticaria 09/07/2014   Amaurosis fugax 09/07/2014   Asthma 09/07/2014   Benign neoplasm of colon 09/07/2014   Esophageal reflux 09/07/2014   Lesion of breast 09/07/2014   Sensorineural hearing loss (SNHL), bilateral 09/07/2014   Tinnitus 09/07/2014   Neck pain, chronic 01/13/2014   Baker's cyst of knee 12/09/2013   Stiffness of joint, not elsewhere classified, other specified site 03/02/2013   Weakness of neck 03/02/2013   PCP:  Asencion Noble, MD Pharmacy:   [place], [place] [address] [place] [address] Phone: [phone] Fax: [place], [place]  [address] [place] [address] Phone: [phone] Fax: [phone]  [place] [address], [place] - [place] [id number] [place] [address] [place] [address] Phone: [phone] Fax: [phone]     Social Determinants of Health (SDOH) Interventions    Readmission Risk Interventions     No data to display

## 2022-07-23 NOTE — Evaluation (Signed)
Physical Therapy Evaluation Patient Details Name: [name] MRN: [id number] DOB: [birth year] Today's Date: 07/23/2022  History of Present Illness  Pt is a 76yo female presenting s/p L-TKA on 07/22/22. PMH: stroke, R-RCR, HLD, ankle fx/surgery, cognitive change  Clinical Impression  Pt is s/p TKA resulting in the deficits listed below (see PT Problem List).  Pt reports mod I amb with cane and or RW prior to surgery;  today she presents with decr initiation, rigidity of all extremities and trunk-->overall parkinson-like presentation.  Pt knows she had knee surgery with visual cues, does not know the day/date, at  "[place]". Pt requires excessive time to mobilize, requiring assist of 2 for sit to stand transitions, total assist for bed mobility.  Anticipate slow progress, husband is not able to assist extensively, recommend SNF.  Pt will benefit from skilled PT to increase their independence and safety with mobility to allow discharge to the venue listed below.         Recommendations for follow up therapy are one component of a multi-disciplinary discharge planning process, led by the attending physician.  Recommendations may be updated based on patient status, additional functional criteria and insurance authorization.  Follow Up Recommendations Skilled nursing-short term rehab (<3 hours/day) Can patient physically be transported by private vehicle: No    Assistance Recommended at Discharge Frequent or constant Supervision/Assistance  Patient can return home with the following  Assistance with cooking/housework;Assist for transportation;Help with stairs or ramp for entrance;Direct supervision/assist for financial management;Direct supervision/assist for medications management    Equipment Recommendations None recommended by PT  Recommendations for Other Services       Functional Status Assessment       Precautions / Restrictions Precautions Precautions:  Fall;Knee Restrictions Weight Bearing Restrictions: No Other Position/Activity Restrictions: WBAT      Mobility  Bed Mobility Overal bed mobility: Needs Assistance Bed Mobility: Supine to Sit, Sit to Supine     Supine to sit: Max assist, +2 for safety/equipment, +2 for physical assistance Sit to supine: Total assist, +2 for physical assistance, +2 for safety/equipment   General bed mobility comments: pt requiring assist for LEs and trunk in both directions.requires  incr time, limited pt effort, decr initiation. step by step cues    Transfers Overall transfer level: Needs assistance Equipment used: Rolling walker (2 wheels) Transfers: Sit to/from Stand Sit to Stand: +2 physical assistance, +2 safety/equipment, Max assist, From elevated surface           General transfer comment: STS x2 from elevated bed; requiring assist of 2 to come to near full stand with multii-modal cues and assist with anterior-superior wt shift    Ambulation/Gait               General Gait Details: unable, dizzy with mobility and unable to wt shift to transfer to chair  Stairs            Wheelchair Mobility    Modified Rankin (Stroke Patients Only)       Balance Overall balance assessment: Needs assistance Sitting-balance support: Single extremity supported, Feet supported Sitting balance-Leahy Scale: Poor     Standing balance support: Reliant on assistive device for balance, During functional activity, Bilateral upper extremity supported Standing balance-Leahy Scale: Zero Standing balance comment: reliant on UEs and external support                             Pertinent Vitals/Pain Pain Assessment Pain  Assessment: 0-10 Pain Score: 7  Pain Location: L knee Pain Descriptors / Indicators: Operative site guarding, Sore Pain Intervention(s): Limited activity within patient's tolerance, Monitored during session, Premedicated before session, Repositioned    Home  Living Family/patient expects to be discharged to:: Private residence Living Arrangements: Spouse/significant other Available Help at Discharge: Family;Available 24 hours/day Type of Home: House Home Access: Stairs to enter   [employer] of Steps: 2   Home Layout: Able to live on main level with bedroom/bathroom Home Equipment: Advice worker (2 wheels);Cane - single point      Prior Function Prior Level of Function : Needs assist  Cognitive Assist : Mobility (cognitive);ADLs (cognitive)     Physical Assist : Mobility (physical);ADLs (physical)     Mobility Comments: amb with RW (yesterday she told PT she walks with cane); husband helps her OOB ADLs Comments: Needs assist for bathing, entering/exiting tub. Husband helps with pants, sandals.     Hand Dominance        Extremity/Trunk Assessment   Upper Extremity Assessment Upper Extremity Assessment: Generalized weakness    Lower Extremity Assessment Lower Extremity Assessment: RLE deficits/detail;LLE deficits/detail RLE Deficits / Details: grossly-DF/ PF 2/5, knee 2/5, hip 2/5 or activate glutes; limited to ~ 30 degrees knee flxions AAROM, hip flexion ~ 40 degrees in supine AAROM RLE Coordination: decreased gross motor;decreased fine motor LLE Deficits / Details: reports baseline weakness d/t CVA; df/pf 1/5, knee and hip grossly 2-/5; knee flexion~ 15 degrees aAAROM LLE Coordination: decreased fine motor;decreased gross motor       Communication   Communication: No difficulties  Cognition Arousal/Alertness: Lethargic, Suspect due to medications Behavior During Therapy: Flat affect Overall Cognitive Status: No family/caregiver present to determine baseline cognitive functioning Area of Impairment: Following commands, Memory, Attention, Problem solving                   Current Attention Level: Focused Memory: Decreased short-term memory Following Commands: Follows one step commands with  increased time, Follows multi-step commands inconsistently     Problem Solving: Slow processing, Decreased initiation, Difficulty sequencing, Requires verbal cues, Requires tactile cues General Comments: "monday at "        General Comments      Exercises     Assessment/Plan    PT Assessment Patient needs continued PT services  PT Problem List Decreased strength;Decreased mobility;Decreased range of motion;Decreased activity tolerance;Decreased balance;Pain;Decreased knowledge of use of DME;Decreased safety awareness;Decreased cognition;Decreased coordination       PT Treatment Interventions DME instruction;Therapeutic exercise;Gait training;Therapeutic activities;Patient/family education;Functional mobility training;Balance training    PT Goals (Current goals can be found in the Care Plan section)  Acute Rehab PT Goals Patient Stated Goal: none stated PT Goal Formulation: With patient Time For Goal Achievement: 07/30/22 Potential to Achieve Goals: Good    Frequency 7X/week     Co-evaluation               AM-PAC PT "6 Clicks" Mobility  Outcome Measure Help needed turning from your back to your side while in a flat bed without using bedrails?: Total Help needed moving from lying on your back to sitting on the side of a flat bed without using bedrails?: Total Help needed moving to and from a bed to a chair (including a wheelchair)?: Total Help needed standing up from a chair using your arms (e.g., wheelchair or bedside chair)?: Total Help needed to walk in hospital room?: Total   6 Click Score: 5  End of Session Equipment Utilized During Treatment: Gait belt Activity Tolerance: Patient limited by fatigue;Other (comment);Patient limited by pain (very sleepy) Patient left: in bed;with call bell/phone within reach;with bed alarm set Nurse Communication: Mobility status PT Visit Diagnosis: Other abnormalities of gait and mobility (R26.89);Muscle  weakness (generalized) (M62.81);Other symptoms and signs involving the nervous system (R29.898)    Time: 1020-1059 PT Time Calculation (min) (ACUTE ONLY): 39 min   Charges:   PT Evaluation $PT Eval Low Complexity: 1 Low PT Treatments $Therapeutic Activity: 23-37 mins        Baxter Flattery, PT  Acute Rehab Dept [facility]) [phone]  [facility] Weekend Pager [facility] only)  [phone]  07/23/2022   [facility] 07/23/2022, 11:18 AM

## 2022-07-24 LAB — CBC
HCT: 35.3 % — ABNORMAL LOW (ref 36.0–46.0)
Hemoglobin: 11.5 g/dL — ABNORMAL LOW (ref 12.0–15.0)
MCH: 31.5 pg (ref 26.0–34.0)
MCHC: 32.6 g/dL (ref 30.0–36.0)
MCV: 96.7 fL (ref 80.0–100.0)
Platelets: 183 10*3/uL (ref 150–400)
RBC: 3.65 MIL/uL — ABNORMAL LOW (ref 3.87–5.11)
RDW: 12.7 % (ref 11.5–15.5)
WBC: 14.5 10*3/uL — ABNORMAL HIGH (ref 4.0–10.5)
nRBC: 0 % (ref 0.0–0.2)

## 2022-07-24 MED ORDER — CEFAZOLIN SODIUM-DEXTROSE 2-4 GM/100ML-% IV SOLN
2.0000 g | Freq: Three times a day (TID) | INTRAVENOUS | Status: AC
  Administered 2022-07-24 – 2022-07-26 (×6): 2 g via INTRAVENOUS
  Filled 2022-07-24 (×6): qty 100

## 2022-07-24 NOTE — Care Plan (Signed)
Continuing to follow for progress. Noted PT notes and recommendations. MD and Patient's husband updated. They would still like to avoid SNF if possible. Mr Colton states that he has help with is son and daughter.  Suggested to him that someone be present with therapy sessions to encourage her to participate and to see what amount of work would be required at home.  Will continue to follow. Appreciate CSW assistance in [facility] and bed offers if needed   Ladell Heads, [place]

## 2022-07-24 NOTE — Progress Notes (Signed)
     Subjective: Saw patient and spoke to husband on the phone. She feels like she is making good progress with PT. Hopeful to get to a point in next day or two to safely go home with the assistance of her husband, son, and daughter at home. She notes some redness over anterior shin. Reports that this happens from time to time. She is not having pain or tenderness in this area. Will do extended post op abx protocol.  Objective:   VITALS:   Vitals:   07/24/22 0152 07/24/22 0550 07/24/22 0927 07/24/22 1341  BP: (!) 141/86 133/76 131/60 126/67  Pulse: (!) 101 99 (!) 103 100  Resp: '17 17 18 18  '$ Temp: 99 F (37.2 C) 98.6 F (37 C) 98 F (36.7 C) 98.8 F (37.1 C)  TempSrc: Oral Oral    SpO2: 93% 98% 96% 100%  Weight:      Height:        Sensation intact distally Intact pulses distally Dorsiflexion/Plantar flexion intact Incision: dressing C/D/I Compartment soft   Lab Results  Component Value Date   WBC 14.5 (H) 07/24/2022   HGB 11.5 (L) 07/24/2022   HCT 35.3 (L) 07/24/2022   MCV 96.7 07/24/2022   PLT 183 07/24/2022   BMET    Component Value Date/Time   NA 136 07/23/2022 0313   NA 140 05/27/2017 1553   K 4.0 07/23/2022 0313   CL 102 07/23/2022 0313   CO2 28 07/23/2022 0313   GLUCOSE 142 (H) 07/23/2022 0313   BUN 16 07/23/2022 0313   BUN 15 05/27/2017 1553   CREATININE 0.78 07/23/2022 0313   CALCIUM 8.8 (L) 07/23/2022 0313   GFRNONAA >60 07/23/2022 0313    Xray: Left total knee arthroplasty components good position no adverse features  Assessment/Plan: 2 Days Post-Op   Principal Problem:   Localized osteoarthritis of left knee  Status post left total knee arthroplasty 07/22/2022  Post op recs: WB: WBAT Abx: continue ancef in house. Will discharge with 7 days cefadroxil 500BID given patient's hx of LE cellulitis. Imaging: PACU xrays DVT prophylaxis: Aspirin '81mg'$  BID x4 weeks Follow up: 2 weeks after surgery for a wound check with Dr. Zachery Dakins at [facility].  Address: [address] [place], [place],  [address]  Office Phone: [phone]      [name] 07/24/2022, 5:21 PM   Charlies Constable, MD  Contact information:   [phone] 7am-5pm epic message Dr. Zachery Dakins, or call office for patient follow up: [phone] After hours and holidays please check [email or web address] for group call information for [facility]

## 2022-07-24 NOTE — Progress Notes (Signed)
Dr Yetta Numbers made aware of LLE redness. Bethann Punches RN

## 2022-07-24 NOTE — Progress Notes (Signed)
Physical Therapy Treatment Patient Details Name: [name] MRN: [id number] DOB: 03/21/1946 Today's Date: 07/24/2022   History of Present Illness Pt is a 76yo female presenting s/p L-TKA on 07/22/22. PMH: stroke, R-RCR, HLD, ankle fx/surgery, cognitive change    PT Comments    Pt fatigued and wanting to rest, she agreed to bed level exercises, spouse present. Pt has limited L knee AAROM due to pain (~ 5-40*). Noted L lower leg is red and warm to touch, RN notified.    Recommendations for follow up therapy are one component of a multi-disciplinary discharge planning process, led by the attending physician.  Recommendations may be updated based on patient status, additional functional criteria and insurance authorization.  Follow Up Recommendations  Skilled nursing-short term rehab (<3 hours/day) Can patient physically be transported by private vehicle: No   Assistance Recommended at Discharge Frequent or constant Supervision/Assistance  Patient can return home with the following Assistance with cooking/housework;Assist for transportation;Help with stairs or ramp for entrance;Direct supervision/assist for financial management;Direct supervision/assist for medications management;Two people to help with bathing/dressing/bathroom;Two people to help with walking and/or transfers   Equipment Recommendations  None recommended by PT    Recommendations for Other Services       Precautions / Restrictions Precautions Precautions: Fall;Knee Precaution Booklet Issued: Yes (comment) Precaution Comments: reviewed no pillow under knee Restrictions Weight Bearing Restrictions: No Other Position/Activity Restrictions: WBAT     Mobility  Bed Mobility Overal bed mobility: Needs Assistance Bed Mobility: Supine to Sit     Supine to sit: +2 for safety/equipment, +2 for physical assistance, Total assist     General bed mobility comments: increased time/effort, max verbal/manual cues, pt able  to assist with advancing BLEs towards EOB, total assist to raise trunk and pivot hips with pad    Transfers Overall transfer level: Needs assistance Equipment used: Rolling walker (2 wheels) Transfers: Sit to/from Stand, Bed to chair/wheelchair/BSC Sit to Stand: +2 physical assistance, +2 safety/equipment, Max assist, From elevated surface           General transfer comment: +2 max for sit to stand from bed and from bedside commode x 4 trials, used RW and Stedy, in RW pt was unable to take steps and was incontinent of urine so used Stedy to pivot to bedside commode then to Arts administrator via Lift Equipment: Stedy  Ambulation/Gait                   Stairs             Wheelchair Mobility    Modified Rankin (Stroke Patients Only)       Balance Overall balance assessment: Needs assistance Sitting-balance support: Single extremity supported, Feet supported Sitting balance-Leahy Scale: Fair     Standing balance support: Reliant on assistive device for balance, During functional activity, Bilateral upper extremity supported Standing balance-Leahy Scale: Zero Standing balance comment: reliant on UEs and external support                            Cognition Arousal/Alertness: Awake/alert Behavior During Therapy: Flat affect Overall Cognitive Status: Within Functional Limits for tasks assessed                         Following Commands: Follows one step commands with increased time     Problem Solving: Slow processing, Decreased initiation, Requires tactile cues, Requires verbal cues General Comments: pt oriented to  year, location, reason she's here, self. Husband stated that earlier this morning she thought she was at her high school.        Exercises Total Joint Exercises Ankle Circles/Pumps: AAROM, Both, 10 reps, Supine Quad Sets: AROM, Both, 5 reps, Limitations Quad Sets Limitations: pt had difficulty following commands for quad  sets despite use of many verbal and manual cues Short Arc Quad: AAROM, Left, 10 reps, Supine Heel Slides: AAROM, Left, 10 reps, Supine Hip ABduction/ADduction: AAROM, Left, 10 reps, Supine Straight Leg Raises: AAROM, Left, 10 reps, Supine Knee Flexion: AAROM, Left, 10 reps, Seated Goniometric ROM: 5-40* AAROM L knee    General Comments        Pertinent Vitals/Pain Pain Assessment Pain Score: 7  Pain Location: L knee with movement Pain Descriptors / Indicators: Operative site guarding, Sore Pain Intervention(s): Limited activity within patient's tolerance, Monitored during session, Premedicated before session, Ice applied    Home Living                          Prior Function            PT Goals (current goals can now be found in the care plan section) Acute Rehab PT Goals Patient Stated Goal: none stated PT Goal Formulation: With patient/family Time For Goal Achievement: 07/30/22 Potential to Achieve Goals: Good Progress towards PT goals: Not progressing toward goals - comment    Frequency    7X/week      PT Plan Current plan remains appropriate    Co-evaluation              AM-PAC PT "6 Clicks" Mobility   Outcome Measure  Help needed turning from your back to your side while in a flat bed without using bedrails?: Total Help needed moving from lying on your back to sitting on the side of a flat bed without using bedrails?: Total Help needed moving to and from a bed to a chair (including a wheelchair)?: Total Help needed standing up from a chair using your arms (e.g., wheelchair or bedside chair)?: Total Help needed to walk in hospital room?: Total Help needed climbing 3-5 steps with a railing? : Total 6 Click Score: 6    End of Session Equipment Utilized During Treatment: Gait belt Activity Tolerance: Patient limited by fatigue;Patient limited by pain Patient left: with call bell/phone within reach;with family/visitor present;in bed;with bed  alarm set Nurse Communication: Mobility status;Need for lift equipment (needs new purewick and new TED hose (hers were soiled with urine)) PT Visit Diagnosis: Other abnormalities of gait and mobility (R26.89);Muscle weakness (generalized) (M62.81);Other symptoms and signs involving the nervous system (R29.898)     Time: 9563-8756 PT Time Calculation (min) (ACUTE ONLY): 12 min  Charges:  $Therapeutic Exercise: 8-22 mins                     [name] PT 07/24/2022  Acute Rehabilitation Services  Office [phone]

## 2022-07-24 NOTE — Progress Notes (Signed)
Physical Therapy Treatment Patient Details Name: [name] MRN: [id number] DOB: 16-Dec-1945 Today's Date: 07/24/2022   History of Present Illness Pt is a 76yo female presenting s/p L-TKA on 07/22/22. PMH: stroke, R-RCR, HLD, ankle fx/surgery, cognitive change    PT Comments    Pt required +2 total assist for supine to sit, +2 max assist to stand. In standing with RW she was unable to weight shift to take steps and was incontinent of urine, so used Stedy to transfer to bedside commode and then to recliner. Pt was assisted with TKA HEP. She was oriented x 4 during this session, though does require increased time and manual/verbal cues to follow commands. Her husband stated she thought she was at her high school earlier this morning. She is not progressing well with mobility, ST-SNF recommended.     Recommendations for follow up therapy are one component of a multi-disciplinary discharge planning process, led by the attending physician.  Recommendations may be updated based on patient status, additional functional criteria and insurance authorization.  Follow Up Recommendations  Skilled nursing-short term rehab (<3 hours/day) Can patient physically be transported by private vehicle: No   Assistance Recommended at Discharge Frequent or constant Supervision/Assistance  Patient can return home with the following Assistance with cooking/housework;Assist for transportation;Help with stairs or ramp for entrance;Direct supervision/assist for financial management;Direct supervision/assist for medications management;Two people to help with bathing/dressing/bathroom;Two people to help with walking and/or transfers   Equipment Recommendations  None recommended by PT    Recommendations for Other Services       Precautions / Restrictions Precautions Precautions: Fall;Knee Precaution Booklet Issued: Yes (comment) Precaution Comments: reviewed no pillow under knee Restrictions Weight Bearing  Restrictions: No Other Position/Activity Restrictions: WBAT     Mobility  Bed Mobility Overal bed mobility: Needs Assistance Bed Mobility: Supine to Sit     Supine to sit: +2 for safety/equipment, +2 for physical assistance, Total assist     General bed mobility comments: increased time/effort, max verbal/manual cues, pt able to assist with advancing BLEs towards EOB, total assist to raise trunk and pivot hips with pad    Transfers Overall transfer level: Needs assistance Equipment used: Rolling walker (2 wheels) Transfers: Sit to/from Stand, Bed to chair/wheelchair/BSC Sit to Stand: +2 physical assistance, +2 safety/equipment, Max assist, From elevated surface           General transfer comment: +2 max for sit to stand from bed and from bedside commode x 4 trials, used RW and Stedy, in RW pt was unable to take steps and was incontinent of urine so used Stedy to pivot to bedside commode then to Arts administrator via Lift Equipment: Stedy  Ambulation/Gait                   Stairs             Wheelchair Mobility    Modified Rankin (Stroke Patients Only)       Balance Overall balance assessment: Needs assistance Sitting-balance support: Single extremity supported, Feet supported Sitting balance-Leahy Scale: Fair     Standing balance support: Reliant on assistive device for balance, During functional activity, Bilateral upper extremity supported Standing balance-Leahy Scale: Zero Standing balance comment: reliant on UEs and external support                            Cognition Arousal/Alertness: Awake/alert Behavior During Therapy: Flat affect Overall Cognitive Status: Within Functional Limits for  tasks assessed                         Following Commands: Follows one step commands with increased time     Problem Solving: Slow processing, Decreased initiation, Requires tactile cues, Requires verbal cues General Comments: pt  oriented to year, location, reason she's here, self. Husband stated that earlier this morning she thought she was at her high school.        Exercises Total Joint Exercises Ankle Circles/Pumps: AAROM, Both, 10 reps, Supine Short Arc Quad: AAROM, Left, 10 reps, Supine Heel Slides: AAROM, Left, 10 reps, Supine Hip ABduction/ADduction: AAROM, Left, 10 reps, Supine Knee Flexion: AAROM, Left, 10 reps, Seated Goniometric ROM: 5-40* AAROM L knee    General Comments        Pertinent Vitals/Pain Pain Assessment Pain Score: 7  Pain Location: L knee with movement Pain Descriptors / Indicators: Operative site guarding, Sore Pain Intervention(s): Limited activity within patient's tolerance, Monitored during session, Premedicated before session, Repositioned (pt declined ice)    Home Living                          Prior Function            PT Goals (current goals can now be found in the care plan section) Acute Rehab PT Goals Patient Stated Goal: none stated PT Goal Formulation: With patient Time For Goal Achievement: 07/30/22 Potential to Achieve Goals: Good Progress towards PT goals: Not progressing toward goals - comment (limited by weakness, pain)    Frequency    7X/week      PT Plan Current plan remains appropriate    Co-evaluation              AM-PAC PT "6 Clicks" Mobility   Outcome Measure  Help needed turning from your back to your side while in a flat bed without using bedrails?: Total Help needed moving from lying on your back to sitting on the side of a flat bed without using bedrails?: Total Help needed moving to and from a bed to a chair (including a wheelchair)?: Total Help needed standing up from a chair using your arms (e.g., wheelchair or bedside chair)?: Total Help needed to walk in hospital room?: Total Help needed climbing 3-5 steps with a railing? : Total 6 Click Score: 6    End of Session Equipment Utilized During Treatment: Gait  belt Activity Tolerance: Patient limited by fatigue;Patient limited by pain Patient left: in chair;with call bell/phone within reach;with chair alarm set;with family/visitor present Nurse Communication: Mobility status;Need for lift equipment;Other (comment) (needs new purewick and new TED hose (hers were soiled with urine)) PT Visit Diagnosis: Other abnormalities of gait and mobility (R26.89);Muscle weakness (generalized) (M62.81);Other symptoms and signs involving the nervous system (R29.898)     Time: 1030-1108 PT Time Calculation (min) (ACUTE ONLY): 38 min  Charges:  $Therapeutic Exercise: 8-22 mins $Therapeutic Activity: 23-37 mins                     Blondell Reveal Kistler PT 07/24/2022  Acute Rehabilitation Services  Office [phone]

## 2022-07-24 NOTE — Plan of Care (Signed)
  Problem: Education: Goal: Knowledge of the prescribed therapeutic regimen will improve Outcome: Progressing Goal: Individualized Educational Video(s) Outcome: Progressing   Problem: Activity: Goal: Ability to avoid complications of mobility impairment will improve Outcome: Progressing Goal: Range of joint motion will improve Outcome: Progressing   Problem: Pain Management: Goal: Pain level will decrease with appropriate interventions Outcome: Progressing   Problem: Skin Integrity: Goal: Will show signs of wound healing Outcome: Progressing   Problem: Health Behavior/Discharge Planning: Goal: Ability to manage health-related needs will improve Outcome: Progressing

## 2022-07-24 NOTE — Progress Notes (Signed)
     Subjective:  Patient reports pain as mild.  Had significant challenges with mobility yesterday but patient and family are eager to get her to a point where she could safely discharge home. Low demand at baseline and has good family support. Encouraged her to continue to work hard with PT.  Objective:   VITALS:   Vitals:   07/23/22 1344 07/23/22 2208 07/24/22 0152 07/24/22 0550  BP: 138/82 (!) 140/76 (!) 141/86 133/76  Pulse: (!) 110 100 (!) 101 99  Resp: '16 17 17 17  '$ Temp: 98.6 F (37 C) 98.2 F (36.8 C) 99 F (37.2 C) 98.6 F (37 C)  TempSrc:  Oral Oral Oral  SpO2: 93% 94% 93% 98%  Weight:      Height:        Sensation intact distally Intact pulses distally Dorsiflexion/Plantar flexion intact Incision: dressing C/D/I Compartment soft   Lab Results  Component Value Date   WBC 14.5 (H) 07/24/2022   HGB 11.5 (L) 07/24/2022   HCT 35.3 (L) 07/24/2022   MCV 96.7 07/24/2022   PLT 183 07/24/2022   BMET    Component Value Date/Time   NA 136 07/23/2022 0313   NA 140 05/27/2017 1553   K 4.0 07/23/2022 0313   CL 102 07/23/2022 0313   CO2 28 07/23/2022 0313   GLUCOSE 142 (H) 07/23/2022 0313   BUN 16 07/23/2022 0313   BUN 15 05/27/2017 1553   CREATININE 0.78 07/23/2022 0313   CALCIUM 8.8 (L) 07/23/2022 0313   GFRNONAA >60 07/23/2022 0313    Xray: Left total knee arthroplasty components good position no adverse features  Assessment/Plan: 2 Days Post-Op   Principal Problem:   Localized osteoarthritis of left knee  Status post left total knee arthroplasty 07/22/2022  Post op recs: WB: WBAT Abx: ancef Imaging: PACU xrays DVT prophylaxis: Aspirin '81mg'$  BID x4 weeks Follow up: 2 weeks after surgery for a wound check with Dr. Zachery Dakins at [facility].  Address: [address] [place], [place], [place] [address]  Office Phone: [phone]      [name] 07/24/2022, 7:07 AM   Charlies Constable, MD  Contact information:    [phone] 7am-5pm epic message Dr. Zachery Dakins, or call office for patient follow up: [phone] After hours and holidays please check [email or web address] for group call information for [facility]

## 2022-07-25 DIAGNOSIS — Z886 Allergy status to analgesic agent status: Secondary | ICD-10-CM | POA: Diagnosis not present

## 2022-07-25 DIAGNOSIS — Z79899 Other long term (current) drug therapy: Secondary | ICD-10-CM | POA: Diagnosis not present

## 2022-07-25 DIAGNOSIS — Z20822 Contact with and (suspected) exposure to covid-19: Secondary | ICD-10-CM | POA: Diagnosis present

## 2022-07-25 DIAGNOSIS — Z803 Family history of malignant neoplasm of breast: Secondary | ICD-10-CM | POA: Diagnosis not present

## 2022-07-25 DIAGNOSIS — Z6834 Body mass index (BMI) 34.0-34.9, adult: Secondary | ICD-10-CM | POA: Diagnosis not present

## 2022-07-25 DIAGNOSIS — E785 Hyperlipidemia, unspecified: Secondary | ICD-10-CM | POA: Diagnosis present

## 2022-07-25 DIAGNOSIS — Z818 Family history of other mental and behavioral disorders: Secondary | ICD-10-CM | POA: Diagnosis not present

## 2022-07-25 DIAGNOSIS — E669 Obesity, unspecified: Secondary | ICD-10-CM | POA: Diagnosis present

## 2022-07-25 DIAGNOSIS — M21062 Valgus deformity, not elsewhere classified, left knee: Secondary | ICD-10-CM | POA: Diagnosis present

## 2022-07-25 DIAGNOSIS — M1712 Unilateral primary osteoarthritis, left knee: Secondary | ICD-10-CM | POA: Diagnosis present

## 2022-07-25 DIAGNOSIS — Z8673 Personal history of transient ischemic attack (TIA), and cerebral infarction without residual deficits: Secondary | ICD-10-CM | POA: Diagnosis not present

## 2022-07-25 DIAGNOSIS — Z882 Allergy status to sulfonamides status: Secondary | ICD-10-CM | POA: Diagnosis not present

## 2022-07-25 DIAGNOSIS — H9113 Presbycusis, bilateral: Secondary | ICD-10-CM | POA: Diagnosis present

## 2022-07-25 DIAGNOSIS — Z888 Allergy status to other drugs, medicaments and biological substances status: Secondary | ICD-10-CM | POA: Diagnosis not present

## 2022-07-25 DIAGNOSIS — Z8249 Family history of ischemic heart disease and other diseases of the circulatory system: Secondary | ICD-10-CM | POA: Diagnosis not present

## 2022-07-25 DIAGNOSIS — F32A Depression, unspecified: Secondary | ICD-10-CM | POA: Diagnosis present

## 2022-07-25 DIAGNOSIS — Z811 Family history of alcohol abuse and dependence: Secondary | ICD-10-CM | POA: Diagnosis not present

## 2022-07-25 LAB — CBC
HCT: 31.9 % — ABNORMAL LOW (ref 36.0–46.0)
Hemoglobin: 10.3 g/dL — ABNORMAL LOW (ref 12.0–15.0)
MCH: 32.1 pg (ref 26.0–34.0)
MCHC: 32.3 g/dL (ref 30.0–36.0)
MCV: 99.4 fL (ref 80.0–100.0)
Platelets: 179 10*3/uL (ref 150–400)
RBC: 3.21 MIL/uL — ABNORMAL LOW (ref 3.87–5.11)
RDW: 13 % (ref 11.5–15.5)
WBC: 11.3 10*3/uL — ABNORMAL HIGH (ref 4.0–10.5)
nRBC: 0 % (ref 0.0–0.2)

## 2022-07-25 NOTE — Plan of Care (Signed)
  Problem: Pain Management: Goal: Pain level will decrease with appropriate interventions Outcome: Progressing   Problem: Nutrition: Goal: Adequate nutrition will be maintained Outcome: Progressing   

## 2022-07-25 NOTE — Progress Notes (Signed)
     Subjective: Saw patient and spoke to husband this afternoon. She is making slow progress with PT. Hopeful to go home soon. Patient and husband hopeful she is able stand and move more independently where she can be managed with assistance of husband and her children. Redness gone this AM, may have been 2/2 to irritation from ted stocking which has been discontinued.  Objective:   VITALS:   Vitals:   07/24/22 1341 07/24/22 2157 07/25/22 0511 07/25/22 1347  BP: 126/67 134/69 (!) 116/54 (!) 105/58  Pulse: 100 100 (!) 105 (!) 109  Resp: '18 17 17 15  '$ Temp: 98.8 F (37.1 C) 99.2 F (37.3 C) 98.1 F (36.7 C) 98 F (36.7 C)  TempSrc:   Oral   SpO2: 100% 97% 97% 97%  Weight:      Height:        Sensation intact distally Intact pulses distally Dorsiflexion/Plantar flexion intact Incision: dressing C/D/I Compartment soft   Lab Results  Component Value Date   WBC 11.3 (H) 07/25/2022   HGB 10.3 (L) 07/25/2022   HCT 31.9 (L) 07/25/2022   MCV 99.4 07/25/2022   PLT 179 07/25/2022   BMET    Component Value Date/Time   NA 136 07/23/2022 0313   NA 140 05/27/2017 1553   K 4.0 07/23/2022 0313   CL 102 07/23/2022 0313   CO2 28 07/23/2022 0313   GLUCOSE 142 (H) 07/23/2022 0313   BUN 16 07/23/2022 0313   BUN 15 05/27/2017 1553   CREATININE 0.78 07/23/2022 0313   CALCIUM 8.8 (L) 07/23/2022 0313   GFRNONAA >60 07/23/2022 0313    Xray: Left total knee arthroplasty components good position no adverse features  Assessment/Plan: 3 Days Post-Op   Principal Problem:   Localized osteoarthritis of left knee  Status post left total knee arthroplasty 07/22/2022  Post op recs: WB: WBAT Abx: continue ancef in house. Will discharge with 7 days cefadroxil 500BID given patient's hx of LE cellulitis. Imaging: PACU xrays DVT prophylaxis: Aspirin '81mg'$  BID x4 weeks Follow up: 2 weeks after surgery for a wound check with Dr. Zachery Dakins at [facility].  Address: [address] [place], [place], [place] [address]  Office Phone: [phone]      [name] 07/25/2022, 2:00 PM   Charlies Constable, MD  Contact information:   [phone] 7am-5pm epic message Dr. Zachery Dakins, or call office for patient follow up: [phone] After hours and holidays please check [email or web address] for group call information for [facility]

## 2022-07-25 NOTE — Progress Notes (Signed)
Physical Therapy Treatment Patient Details Name: [name] MRN: [id number] DOB: January 29, 1946 Today's Date: 07/25/2022   History of Present Illness Pt is a 76yo female presenting s/p L-TKA on 07/22/22. PMH: stroke, R-RCR, HLD, ankle fx/surgery, cognitive change    PT Comments    +2 max assist for sit to stand from recliner, pt unsteady in standing requiring mod assist for balance while holding RW. Pt took a few pivotal steps with RW with max verbal/manual cues for technique, and increased time. Assisted pt with L TKA exercises. Pt's spouse is hoping to take pt home. I encouraged him to arrange +2 assistance, he was not sure who he could call for help at home. ST-SNF recommended as pt is progressing slowly, requiring +2 max assist for mobility, and is a high fall risk due to poor balance and decreased safety awareness.     Recommendations for follow up therapy are one component of a multi-disciplinary discharge planning process, led by the attending physician.  Recommendations may be updated based on patient status, additional functional criteria and insurance authorization.  Follow Up Recommendations  Skilled nursing-short term rehab (<3 hours/day) Can patient physically be transported by private vehicle: No   Assistance Recommended at Discharge Frequent or constant Supervision/Assistance  Patient can return home with the following Assistance with cooking/housework;Assist for transportation;Help with stairs or ramp for entrance;Direct supervision/assist for financial management;Direct supervision/assist for medications management;Two people to help with bathing/dressing/bathroom;Two people to help with walking and/or transfers   Equipment Recommendations  None recommended by PT    Recommendations for Other Services       Precautions / Restrictions Precautions Precautions: Fall;Knee Precaution Booklet Issued: Yes (comment) Precaution Comments: reviewed no pillow under  knee Restrictions Weight Bearing Restrictions: No Other Position/Activity Restrictions: WBAT     Mobility  Bed Mobility Overal bed mobility: Needs Assistance Bed Mobility: Sit to Supine     Supine to sit: +2 for safety/equipment, +2 for physical assistance, Max assist Sit to supine: Max assist, +2 for physical assistance, +2 for safety/equipment   General bed mobility comments: increased time/effort, max verbal/manual cues, +2 total to scoot in bed    Transfers Overall transfer level: Needs assistance Equipment used: Rolling walker (2 wheels) Transfers: Sit to/from Stand Sit to Stand: +2 physical assistance, +2 safety/equipment, From elevated surface, Max assist   Step pivot transfers: Min assist, +2 physical assistance, +2 safety/equipment       General transfer comment: +2 max for sit to stand from recliner, VCs for hand and RLE placement, unsteady in standing requiring mod A to prevent fall; max VCs for sequencing/technique to take a few pivotal steps to bed    Ambulation/Gait Ambulation/Gait assistance: +2 safety/equipment, +2 physical assistance, Min assist Gait Distance (Feet): 2 Feet Assistive device: Rolling walker (2 wheels) Gait Pattern/deviations: Step-to pattern, Decreased step length - right, Decreased step length - left       General Gait Details: increased time, max verbal cues for sequencing, distance limited by pain/fatigue, chair to bed   Stairs             Wheelchair Mobility    Modified Rankin (Stroke Patients Only)       Balance Overall balance assessment: Needs assistance Sitting-balance support: Single extremity supported, Feet supported Sitting balance-Leahy Scale: Fair     Standing balance support: Reliant on assistive device for balance, During functional activity, Bilateral upper extremity supported Standing balance-Leahy Scale: Poor Standing balance comment: reliant on UEs and external support  Cognition Arousal/Alertness: Awake/alert Behavior During Therapy: Flat affect Overall Cognitive Status: Impaired/Different from baseline Area of Impairment: Orientation                 Orientation Level: Place, Time, Situation   Memory: Decreased short-term memory Following Commands: Follows one step commands with increased time     Problem Solving: Slow processing, Decreased initiation, Requires tactile cues, Requires verbal cues General Comments: pt stated she's in [place], stated day of her birthdate incorrectly, able to correctly state her name        Exercises Total Joint Exercises Ankle Circles/Pumps: AAROM, Both, 10 reps, Supine Quad Sets: 5 reps Short Arc Quad: AAROM, Left, Supine, 15 reps Heel Slides: AAROM, Left, 10 reps, Supine Hip ABduction/ADduction: AAROM, Left, 10 reps, Supine Straight Leg Raises: AAROM, Left, 10 reps, Supine Long Arc Quad: AAROM, Left, 10 reps, Seated Knee Flexion: AAROM, Left, 10 reps, Seated Goniometric ROM: 5-45* AAROM L knee    General Comments        Pertinent Vitals/Pain Pain Assessment Pain Assessment: Faces Pain Score: 7  Faces Pain Scale: Hurts even more Pain Location: L knee with movement Pain Descriptors / Indicators: Operative site guarding, Sore Pain Intervention(s): Limited activity within patient's tolerance, Monitored during session, Patient requesting pain meds-RN notified    Home Living                          Prior Function            PT Goals (current goals can now be found in the care plan section) Acute Rehab PT Goals Patient Stated Goal: none stated PT Goal Formulation: With patient/family Time For Goal Achievement: 07/30/22 Potential to Achieve Goals: Good Progress towards PT goals: Progressing toward goals    Frequency    7X/week      PT Plan Current plan remains appropriate    Co-evaluation              AM-PAC PT "6 Clicks" Mobility   Outcome Measure   Help needed turning from your back to your side while in a flat bed without using bedrails?: A Lot Help needed moving from lying on your back to sitting on the side of a flat bed without using bedrails?: A Lot Help needed moving to and from a bed to a chair (including a wheelchair)?: A Lot Help needed standing up from a chair using your arms (e.g., wheelchair or bedside chair)?: A Lot Help needed to walk in hospital room?: A Lot Help needed climbing 3-5 steps with a railing? : Total 6 Click Score: 11    End of Session Equipment Utilized During Treatment: Gait belt Activity Tolerance: Patient limited by fatigue;Patient limited by pain Patient left: with call bell/phone within reach;in bed;with bed alarm set Nurse Communication: Mobility status;Need for lift equipment (needs new purewick and new TED hose (hers were soiled with urine)) PT Visit Diagnosis: Other abnormalities of gait and mobility (R26.89);Muscle weakness (generalized) (M62.81);Other symptoms and signs involving the nervous system (R29.898)     Time: 5852-7782 PT Time Calculation (min) (ACUTE ONLY): 23 min  Charges:  $Therapeutic Exercise: 8-22 mins $Therapeutic Activity: 8-22 mins                     Blondell Reveal Kistler PT 07/25/2022  Acute Rehabilitation Services  Office [phone]

## 2022-07-25 NOTE — Progress Notes (Addendum)
Physical Therapy Treatment Patient Details Name: [name] MRN: [id number] DOB: November 03, 1946 Today's Date: 07/25/2022   History of Present Illness Pt is a 76yo female presenting s/p L-TKA on 07/22/22. PMH: stroke, R-RCR, HLD, ankle fx/surgery, cognitive change    PT Comments    Pt was not oriented to place, situation nor to time, she stated she's in [place], and gave incorrect day for her birthdate but was able to correct it with prompting. Pt did make some progress with mobility today, she required less assistance for supine to sit, though still requires max assist of 2, and she was able to take a few steps with a RW. Pt tolerates only very minimal movement of L knee, AAROM is ~5-40*, limited by pain. ST-SNF recommended due to need for assist of 2 for mobility and slow progress. Noted decreased redness in L lower leg.     Recommendations for follow up therapy are one component of a multi-disciplinary discharge planning process, led by the attending physician.  Recommendations may be updated based on patient status, additional functional criteria and insurance authorization.  Follow Up Recommendations  Skilled nursing-short term rehab (<3 hours/day) Can patient physically be transported by private vehicle: No   Assistance Recommended at Discharge Frequent or constant Supervision/Assistance  Patient can return home with the following Assistance with cooking/housework;Assist for transportation;Help with stairs or ramp for entrance;Direct supervision/assist for financial management;Direct supervision/assist for medications management;Two people to help with bathing/dressing/bathroom;Two people to help with walking and/or transfers   Equipment Recommendations  None recommended by PT    Recommendations for Other Services       Precautions / Restrictions Precautions Precautions: Fall;Knee Precaution Booklet Issued: Yes (comment) Precaution Comments: reviewed no pillow under  knee Restrictions Weight Bearing Restrictions: No Other Position/Activity Restrictions: WBAT     Mobility  Bed Mobility Overal bed mobility: Needs Assistance Bed Mobility: Supine to Sit     Supine to sit: +2 for safety/equipment, +2 for physical assistance, Max assist     General bed mobility comments: increased time/effort, max verbal/manual cues, pt able to assist with advancing BLEs towards EOB, max assist to raise trunk and pivot hips with pad    Transfers Overall transfer level: Needs assistance Equipment used: Rolling walker (2 wheels) Transfers: Sit to/from Stand Sit to Stand: +2 physical assistance, +2 safety/equipment, From elevated surface, Mod assist           General transfer comment: +2 mod for sit to stand from bed, pulled up on RW with BUEs.    Ambulation/Gait Ambulation/Gait assistance: +2 safety/equipment, +2 physical assistance, Min assist Gait Distance (Feet): 3 Feet Assistive device: Rolling walker (2 wheels) Gait Pattern/deviations: Step-to pattern, Decreased step length - right, Decreased step length - left       General Gait Details: increased time, max verbal cues for sequencing, distance limited by pain/fatigue   Stairs             Wheelchair Mobility    Modified Rankin (Stroke Patients Only)       Balance Overall balance assessment: Needs assistance Sitting-balance support: Single extremity supported, Feet supported Sitting balance-Leahy Scale: Fair     Standing balance support: Reliant on assistive device for balance, During functional activity, Bilateral upper extremity supported Standing balance-Leahy Scale: Poor Standing balance comment: reliant on UEs and external support                            Cognition Arousal/Alertness: Awake/alert  Behavior During Therapy: Flat affect Overall Cognitive Status: Impaired/Different from baseline Area of Impairment: Orientation                 Orientation  Level: Place, Time, Situation   Memory: Decreased short-term memory Following Commands: Follows one step commands with increased time     Problem Solving: Slow processing, Decreased initiation, Requires tactile cues, Requires verbal cues General Comments: pt stated she's in [place], stated day of her birthdate incorrectly, able to correctly state her name        Exercises Total Joint Exercises Ankle Circles/Pumps: AAROM, Both, 10 reps, Supine Quad Sets: 5 reps Short Arc Quad: AAROM, Left, 10 reps, Supine Heel Slides: AAROM, Left, 10 reps, Supine Hip ABduction/ADduction: AAROM, Left, 10 reps, Supine Straight Leg Raises: AAROM, Left, 10 reps, Supine Long Arc Quad: AAROM, Left, 10 reps, Seated Goniometric ROM: 5-40* aAROM L knee    General Comments        Pertinent Vitals/Pain Pain Assessment Pain Assessment: Faces Faces Pain Scale: Hurts even more Pain Location: L knee with movement Pain Descriptors / Indicators: Operative site guarding, Sore Pain Intervention(s): Limited activity within patient's tolerance, Monitored during session, Repositioned (pt declined ice)    Home Living                          Prior Function            PT Goals (current goals can now be found in the care plan section) Acute Rehab PT Goals Patient Stated Goal: none stated PT Goal Formulation: With patient/family Time For Goal Achievement: 07/30/22 Potential to Achieve Goals: Good Progress towards PT goals: Progressing toward goals    Frequency    7X/week      PT Plan Current plan remains appropriate    Co-evaluation              AM-PAC PT "6 Clicks" Mobility   Outcome Measure  Help needed turning from your back to your side while in a flat bed without using bedrails?: A Lot Help needed moving from lying on your back to sitting on the side of a flat bed without using bedrails?: A Lot Help needed moving to and from a bed to a chair (including a wheelchair)?: A  Lot Help needed standing up from a chair using your arms (e.g., wheelchair or bedside chair)?: A Lot Help needed to walk in hospital room?: A Lot Help needed climbing 3-5 steps with a railing? : Total 6 Click Score: 11    End of Session Equipment Utilized During Treatment: Gait belt Activity Tolerance: Patient limited by fatigue;Patient limited by pain Patient left: with call bell/phone within reach;in chair;with chair alarm set Nurse Communication: Mobility status;Need for lift equipment (needs new purewick and new TED hose (hers were soiled with urine)) PT Visit Diagnosis: Other abnormalities of gait and mobility (R26.89);Muscle weakness (generalized) (M62.81);Other symptoms and signs involving the nervous system (R29.898)     Time: 2620-3559 PT Time Calculation (min) (ACUTE ONLY): 23 min  Charges:  $Gait Training: 8-22 mins $Therapeutic Exercise: 8-22 mins                     Blondell Reveal Kistler PT 07/25/2022  Acute Rehabilitation Services  Office [phone]

## 2022-07-26 NOTE — Progress Notes (Signed)
Physical Therapy Treatment Patient Details Name: [name] MRN: [id number] DOB: [birth year] Today's Date: 07/26/2022   History of Present Illness Pt is a 76yo female presenting s/p L-TKA on 07/22/22. PMH: stroke, R-RCR, HLD, ankle fx/surgery, cognitive change    PT Comments    +2 max assist for supine to sit and sit to stand, pt has heavy posterior lean in standing requiring assistance to prevent a fall. Pt was able to take ~5 steps with RW, distance limited by pain and fatigue. Pt is a very high fall risk. Reinforced with pt/spouse that they will need assistance of 2 people for mobility if they go home. Pt and spouse were not in agreement about having such help available. ST-SNF recommended.    Recommendations for follow up therapy are one component of a multi-disciplinary discharge planning process, led by the attending physician.  Recommendations may be updated based on patient status, additional functional criteria and insurance authorization.  Follow Up Recommendations  Skilled nursing-short term rehab (<3 hours/day) Can patient physically be transported by private vehicle: No   Assistance Recommended at Discharge Frequent or constant Supervision/Assistance  Patient can return home with the following Assistance with cooking/housework;Assist for transportation;Help with stairs or ramp for entrance;Direct supervision/assist for financial management;Direct supervision/assist for medications management;Two people to help with bathing/dressing/bathroom;Two people to help with walking and/or transfers   Equipment Recommendations  None recommended by PT    Recommendations for Other Services       Precautions / Restrictions Precautions Precautions: Fall;Knee Precaution Booklet Issued: Yes (comment) Precaution Comments: reviewed no pillow under knee Restrictions Weight Bearing Restrictions: No Other Position/Activity Restrictions: WBAT     Mobility  Bed Mobility Overal bed  mobility: Needs Assistance Bed Mobility: Supine to Sit     Supine to sit: +2 for safety/equipment, +2 for physical assistance, Max assist     General bed mobility comments: increased time/effort, max verbal/manual cues, +2 max to raise trunk and pivot hips to edge of bed    Transfers Overall transfer level: Needs assistance Equipment used: Rolling walker (2 wheels) Transfers: Sit to/from Stand Sit to Stand: +2 physical assistance, +2 safety/equipment, Max assist, From elevated surface           General transfer comment: +2 max for sit to stand from elevated bed, VCs for hand and RLE placement, unsteady in standing with posterior lean requiring mod A to prevent fall; max VCs for sequencing/technique to take a few steps forward    Ambulation/Gait Ambulation/Gait assistance: +2 safety/equipment, +2 physical assistance, Min assist Gait Distance (Feet): 3 Feet Assistive device: Rolling walker (2 wheels) Gait Pattern/deviations: Step-to pattern, Decreased step length - right, Decreased step length - left Gait velocity: decreased     General Gait Details: increased time, max verbal cues for sequencing, distance limited by pain/fatigue, chair to bed, increased time   Stairs             Wheelchair Mobility    Modified Rankin (Stroke Patients Only)       Balance Overall balance assessment: Needs assistance Sitting-balance support: Single extremity supported, Feet supported Sitting balance-Leahy Scale: Fair   Postural control: Posterior lean Standing balance support: Reliant on assistive device for balance, During functional activity, Bilateral upper extremity supported Standing balance-Leahy Scale: Poor Standing balance comment: reliant on UEs and external support, mod A for posterior lean  Cognition Arousal/Alertness: Awake/alert Behavior During Therapy: Flat affect Overall Cognitive Status: Impaired/Different from baseline                          Following Commands: Follows one step commands with increased time     Problem Solving: Slow processing, Decreased initiation, Requires tactile cues, Requires verbal cues          Exercises Total Joint Exercises Ankle Circles/Pumps: AAROM, Both, 10 reps, Supine Short Arc Quad: AAROM, Left, Supine, 15 reps Heel Slides: AAROM, Left, 10 reps, Supine Hip ABduction/ADduction: AAROM, Left, 10 reps, Supine Straight Leg Raises: AAROM, Left, 10 reps, Supine Knee Flexion: AAROM, Left, 10 reps, Seated Goniometric ROM: 5-45* AAROM L knee    General Comments        Pertinent Vitals/Pain Pain Assessment Pain Score: 5  Pain Location: L knee with movement Pain Descriptors / Indicators: Operative site guarding, Sore Pain Intervention(s): Limited activity within patient's tolerance, Monitored during session, Premedicated before session, Ice applied, Repositioned    Home Living                          Prior Function            PT Goals (current goals can now be found in the care plan section) Acute Rehab PT Goals Patient Stated Goal: none stated PT Goal Formulation: With patient/family Time For Goal Achievement: 07/30/22 Potential to Achieve Goals: Good Progress towards PT goals: Progressing toward goals    Frequency    7X/week      PT Plan Current plan remains appropriate    Co-evaluation              AM-PAC PT "6 Clicks" Mobility   Outcome Measure  Help needed turning from your back to your side while in a flat bed without using bedrails?: A Lot Help needed moving from lying on your back to sitting on the side of a flat bed without using bedrails?: A Lot Help needed moving to and from a bed to a chair (including a wheelchair)?: A Lot Help needed standing up from a chair using your arms (e.g., wheelchair or bedside chair)?: A Lot Help needed to walk in hospital room?: A Lot Help needed climbing 3-5 steps with a railing?  : Total 6 Click Score: 11    End of Session Equipment Utilized During Treatment: Gait belt Activity Tolerance: Patient limited by fatigue;Patient limited by pain Patient left: with call bell/phone within reach;in chair;with chair alarm set;with family/visitor present Nurse Communication: Mobility status;Need for lift equipment (needs new purewick and new TED hose (hers were soiled with urine)) PT Visit Diagnosis: Other abnormalities of gait and mobility (R26.89);Muscle weakness (generalized) (M62.81);Other symptoms and signs involving the nervous system (R29.898)     Time: 1127-1200 PT Time Calculation (min) (ACUTE ONLY): 33 min  Charges:  $Gait Training: 8-22 mins $Therapeutic Exercise: 8-22 mins                     Blondell Reveal Kistler PT 07/26/2022  Acute Rehabilitation Services  Office [phone]

## 2022-07-26 NOTE — Progress Notes (Signed)
Physical Therapy Treatment Patient Details Name: [name] MRN: [id number] DOB: 01-Jan-1946 Today's Date: 07/26/2022   History of Present Illness Pt is a 76yo female presenting s/p L-TKA on 07/22/22. PMH: stroke, R-RCR, HLD, ankle fx/surgery, cognitive change    PT Comments    Pt recently transferred recliner to bed with +2 assist from nursing. +2 total assist for scooting up in bed. Assisted pt with L TKA HEP. Pt puts forth good effort, activity tolerance limited by pain and fatigue. Pt stated she and her husband would like for pt to [facility].     Recommendations for follow up therapy are one component of a multi-disciplinary discharge planning process, led by the attending physician.  Recommendations may be updated based on patient status, additional functional criteria and insurance authorization.  Follow Up Recommendations  Skilled nursing-short term rehab (<3 hours/day) Can patient physically be transported by private vehicle: No   Assistance Recommended at Discharge Frequent or constant Supervision/Assistance  Patient can return home with the following Assistance with cooking/housework;Assist for transportation;Help with stairs or ramp for entrance;Direct supervision/assist for financial management;Direct supervision/assist for medications management;Two people to help with bathing/dressing/bathroom;Two people to help with walking and/or transfers   Equipment Recommendations  None recommended by PT    Recommendations for Other Services       Precautions / Restrictions Precautions Precautions: Fall;Knee Precaution Booklet Issued: Yes (comment) Precaution Comments: reviewed no pillow under knee Restrictions Weight Bearing Restrictions: No Other Position/Activity Restrictions: WBAT     Cognition Arousal/Alertness: Awake/alert Behavior During Therapy: Flat affect Overall Cognitive Status: Impaired/Different from baseline                         Following  Commands: Follows one step commands with increased time     Problem Solving: Slow processing, Decreased initiation, Requires tactile cues, Requires verbal cues General Comments: pt oriented to location and situation today        Exercises Total Joint Exercises Ankle Circles/Pumps: AAROM, Both, 10 reps, Supine Quad Sets: AROM, Both, 10 reps, Supine Short Arc Quad: AAROM, Left, Supine, 15 reps Heel Slides: AAROM, Left, 10 reps, Supine Hip ABduction/ADduction: AAROM, Left, 10 reps, Supine Straight Leg Raises: AAROM, Left, 10 reps, Supine Knee Flexion: AAROM, Left, Seated, 15 reps Goniometric ROM: 5-45* AAROM L knee    General Comments        Pertinent Vitals/Pain Pain Assessment Pain Score: 7  Pain Location: L knee with movement Pain Descriptors / Indicators: Operative site guarding, Sore Pain Intervention(s): Limited activity within patient's tolerance, Monitored during session, Patient requesting pain meds-RN notified, Ice applied, Repositioned    Home Living                          Prior Function            PT Goals (current goals can now be found in the care plan section) Acute Rehab PT Goals Patient Stated Goal: none stated PT Goal Formulation: With patient/family Time For Goal Achievement: 07/30/22 Potential to Achieve Goals: Good Progress towards PT goals: Progressing toward goals    Frequency    7X/week      PT Plan Current plan remains appropriate    Co-evaluation              AM-PAC PT "6 Clicks" Mobility   Outcome Measure  Help needed turning from your back to your side while in a flat bed without  using bedrails?: A Lot Help needed moving from lying on your back to sitting on the side of a flat bed without using bedrails?: Total Help needed moving to and from a bed to a chair (including a wheelchair)?: A Lot Help needed standing up from a chair using your arms (e.g., wheelchair or bedside chair)?: Total Help needed to walk in  hospital room?: A Lot Help needed climbing 3-5 steps with a railing? : Total 6 Click Score: 9    End of Session Equipment Utilized During Treatment: Gait belt Activity Tolerance: Patient limited by fatigue;Patient limited by pain Patient left: with call bell/phone within reach;in bed;with bed alarm set;with nursing/sitter in room Nurse Communication: Mobility status;Need for lift equipment (needs new purewick and new TED hose (hers were soiled with urine)) PT Visit Diagnosis: Other abnormalities of gait and mobility (R26.89);Muscle weakness (generalized) (M62.81);Other symptoms and signs involving the nervous system (S31.594)     Time: 5859-2924 PT Time Calculation (min) (ACUTE ONLY): 21 min  Charges:   $Therapeutic Exercise: 8-22 mins

## 2022-07-26 NOTE — TOC Progression Note (Signed)
Transition of Care [facility]) - Progression Note    Patient Details  Name: [name] MRN: [id number] Date of Birth: Apr 06, 1946  Transition [facility] Care [facility]) CM/SW Contact  Purcell Mouton, RN Phone Number: 07/26/2022, 3:53 PM  Clinical Narrative:     Spoke with pt and husband at bedside concerning SNF placement. Pt could not decide at present time.   Expected Discharge Plan: [place] Barriers to Discharge: Continued Medical Work up  Expected Discharge Plan and Services Expected Discharge Plan: [place] Choice: [place] arrangements for the past 2 months: Single Family Home                           HH Arranged: PT, OT HH Agency: [facility]         Social Determinants of Health (SDOH) Interventions    Readmission Risk Interventions     No data to display

## 2022-07-26 NOTE — Progress Notes (Signed)
     Subjective: Patient this morning in good spirits but frustrated with her slow postoperative progress.  Feels like she could be safe and do more at home.  States that her children will be around when she gets home.  We will discuss more with her husband later today.  Discussed that if she could be safe with bed to chair mobility and ambulation with a walker should be safe to get her home given good family support.  No new issues.  Pain controlled.  Denies distal numbness and tingling.  Erythema on the shin appears resolved.  Objective:   VITALS:   Vitals:   07/24/22 2157 07/25/22 0511 07/25/22 1347 07/25/22 2109  BP: 134/69 (!) 116/54 (!) 105/58 (!) 106/92  Pulse: 100 (!) 105 (!) 109 (!) 108  Resp: '17 17 15 20  '$ Temp: 99.2 F (37.3 C) 98.1 F (36.7 C) 98 F (36.7 C) 98.7 F (37.1 C)  TempSrc:  Oral  Oral  SpO2: 97% 97% 97% 93%  Weight:      Height:        Sensation intact distally Intact pulses distally Dorsiflexion/Plantar flexion intact Incision: dressing C/D/I Compartment soft   Lab Results  Component Value Date   WBC 11.3 (H) 07/25/2022   HGB 10.3 (L) 07/25/2022   HCT 31.9 (L) 07/25/2022   MCV 99.4 07/25/2022   PLT 179 07/25/2022   BMET    Component Value Date/Time   NA 136 07/23/2022 0313   NA 140 05/27/2017 1553   K 4.0 07/23/2022 0313   CL 102 07/23/2022 0313   CO2 28 07/23/2022 0313   GLUCOSE 142 (H) 07/23/2022 0313   BUN 16 07/23/2022 0313   BUN 15 05/27/2017 1553   CREATININE 0.78 07/23/2022 0313   CALCIUM 8.8 (L) 07/23/2022 0313   GFRNONAA >60 07/23/2022 0313    Xray: Left total knee arthroplasty components good position no adverse features  Assessment/Plan: 4 Days Post-Op   Principal Problem:   Localized osteoarthritis of left knee  Status post left total knee arthroplasty 07/22/2022  Post op recs: WB: WBAT Abx: continue ancef in house. Will discharge with 7 days cefadroxil 500BID given patient's hx of LE cellulitis. Imaging: PACU  xrays DVT prophylaxis: Aspirin '81mg'$  BID x4 weeks Follow up: 2 weeks after surgery for a wound check with Dr. Zachery Dakins at [facility].  Address: [address] [place], [place], [place] [address]  Office Phone: [phone]      [name] 07/26/2022, 5:59 AM   Charlies Constable, MD  Contact information:   [phone] 7am-5pm epic message Dr. Zachery Dakins, or call office for patient follow up: [phone] After hours and holidays please check [email or web address] for group call information for [facility]

## 2022-07-26 NOTE — TOC Progression Note (Signed)
Transition of Care [facility]) - Progression Note    Patient Details  Name: [name] MRN: [id number] Date of Birth: 16-Dec-1945  Transition [facility] Care [facility]) CM/SW Contact  Purcell Mouton, RN Phone Number: 07/26/2022, 4:10 PM  Clinical Narrative:     Pt's husband asked for [facility]. [facility] will not be able to take her until Monday. Pt will need Negative COVID test and insurance auth.  Expected Discharge Plan: [place] Barriers to Discharge: Continued Medical Work up  Expected Discharge Plan and Services Expected Discharge Plan: [place] Choice: [place] arrangements for the past 2 months: Single Family Home                           HH Arranged: PT, OT HH Agency: [place]         Social Determinants of Health (SDOH) Interventions    Readmission Risk Interventions     No data to display

## 2022-07-27 NOTE — Progress Notes (Signed)
Subjective: Patient doing well this morning but remains frustrated with her slow progress.  No new issues.  Pain controlled.  Denies distal numbness and tingling.  Patient and family have decided on SNF versus discharge home.  Objective:   VITALS:   Vitals:   07/26/22 1505 07/26/22 2113 07/27/22 0621 07/27/22 1003  BP: (!) 123/90 (!) 120/55 123/71 110/76  Pulse: 85 (!) 109 (!) 110 80  Resp:  '16 14 17  '$ Temp: 97.9 F (36.6 C) 97.9 F (36.6 C) 98.5 F (36.9 C) 97.8 F (36.6 C)  TempSrc:  Oral Oral Oral  SpO2: 100% 98% 95% 95%  Weight:      Height:        Sensation intact distally Intact pulses distally Dorsiflexion/Plantar flexion intact Incision: dressing C/D/I Compartment soft   Lab Results  Component Value Date   WBC 11.3 (H) 07/25/2022   HGB 10.3 (L) 07/25/2022   HCT 31.9 (L) 07/25/2022   MCV 99.4 07/25/2022   PLT 179 07/25/2022   BMET    Component Value Date/Time   NA 136 07/23/2022 0313   NA 140 05/27/2017 1553   K 4.0 07/23/2022 0313   CL 102 07/23/2022 0313   CO2 28 07/23/2022 0313   GLUCOSE 142 (H) 07/23/2022 0313   BUN 16 07/23/2022 0313   BUN 15 05/27/2017 1553   CREATININE 0.78 07/23/2022 0313   CALCIUM 8.8 (L) 07/23/2022 0313   GFRNONAA >60 07/23/2022 0313    Xray: Left total knee arthroplasty components good position no adverse features  Assessment/Plan: 5 Days Post-Op   Principal Problem:   Localized osteoarthritis of left knee  Status post left total knee arthroplasty 07/22/2022  Post op recs: WB: WBAT Abx: continue ancef in house. Will discharge with 7 days cefadroxil 500BID given patient's hx of LE cellulitis. DVT prophylaxis: Aspirin '81mg'$  BID x4 weeks Dispo: Continue with therapies as tolerated.  TOC following for dispo.  Insurance authorization has been started for patient to go to [facility] SNF on Monday.    Follow up: 2 weeks after surgery for a wound check with Dr. Zachery Dakins at [facility].  Address:  [address] [place], [place], [place] [address]  Office Phone: [phone]    Gwinda Passe PA-C [facility] [phone] (office) [email or web address]   Contact information:   After hours and holidays please check [email or web address] for group call information for [facility]

## 2022-07-27 NOTE — Progress Notes (Signed)
Physical Therapy Treatment Patient Details Name: [name] MRN: [id number] DOB: 1946/03/07 Today's Date: 07/27/2022   History of Present Illness Pt is a 76yo female presenting s/p L-TKA on 07/22/22. PMH: stroke, R-RCR, HLD, ankle fx/surgery, cognitive change    PT Comments    Pt is slowly progressing with acute PT goals with progression of forward ambulation distance without seated rest break. Continues to require MOD A+2 for power up to stand from recliner with cues for hand placement. Pt ambulated ~61f- continues to demonstrate decreased activity tolerance/muscular endurance.  MIN A+2 with close chair follow provided during ambulation. Continue to recommend short term rehab stay upon d/c. Pt will benefit from continued skilled PT to increase independence and maximize safety with mobility.    Recommendations for follow up therapy are one component of a multi-disciplinary discharge planning process, led by the attending physician.  Recommendations may be updated based on patient status, additional functional criteria and insurance authorization.  Follow Up Recommendations  Skilled nursing-short term rehab (<3 hours/day) Can patient physically be transported by private vehicle: No   Assistance Recommended at Discharge Frequent or constant Supervision/Assistance  Patient can return home with the following Assistance with cooking/housework;Assist for transportation;Help with stairs or ramp for entrance;Direct supervision/assist for financial management;Direct supervision/assist for medications management;Two people to help with bathing/dressing/bathroom;Two people to help with walking and/or transfers   Equipment Recommendations  None recommended by PT    Recommendations for Other Services       Precautions / Restrictions Precautions Precautions: Fall;Knee Precaution Booklet Issued: Yes (comment) Precaution Comments: reviewed no pillow under knee Restrictions Weight Bearing  Restrictions: No Other Position/Activity Restrictions: WBAT     Mobility  Bed Mobility               General bed mobility comments: pt in recliner upon entry and returned to recliner at end of session    Transfers Overall transfer level: Needs assistance Equipment used: Rolling walker (2 wheels) Transfers: Sit to/from Stand Sit to Stand: Mod assist, +2 physical assistance, +2 safety/equipment           General transfer comment: increased time to bring pt recliner to upright and slowly lower B LEs to ground. MAX A to scoot hips to edge of chair with use of chuck pad, pt able to initiate some scooting. MOD A +2 for power up to stand with cues for hand placement- pt attempting to use B UEs on RW to pull up. Cues for optimal LE placement prior to standing, pt able to move actively with increased time. Assist required at pelvis for promtion of upright posture/hip extension for full stand.    Ambulation/Gait Ambulation/Gait assistance: Min assist, +2 physical assistance, +2 safety/equipment Gait Distance (Feet): 16 Feet Assistive device: Rolling walker (2 wheels) Gait Pattern/deviations: Step-to pattern, Decreased step length - right, Decreased step length - left, Decreased weight shift to left Gait velocity: decreased     General Gait Details: able to ambulate 153fin room-required encouragement/distraction for increased distance this session. Cues for sequencing of steps. 3rd person utilized  (pt's husband) to assist with close chair follow. Pt able to progress RW forward with less assist from therapist.   Stairs             Wheelchair Mobility    Modified Rankin (Stroke Patients Only)       Balance Overall balance assessment: Needs assistance Sitting-balance support: Single extremity supported, Feet supported Sitting balance-Leahy Scale: Fair   Postural control: Posterior lean Standing  balance support: Reliant on assistive device for balance, During  functional activity, Bilateral upper extremity supported Standing balance-Leahy Scale: Poor Standing balance comment: reliant on UEs and external support, mod A for correction of posterior lean                            Cognition Arousal/Alertness: Awake/alert Behavior During Therapy: Flat affect Overall Cognitive Status: Impaired/Different from baseline                         Following Commands: Follows one step commands with increased time     Problem Solving: Slow processing, Decreased initiation, Requires tactile cues, Requires verbal cues General Comments: increased time/repetition. pt HOH- hears better out of R ear        Exercises Total Joint Exercises Ankle Circles/Pumps: AROM, Both, Seated, 20 reps Quad Sets: AROM, Left, 15 reps, Seated, Limitations Quad Sets Limitations: repeated verbal/tactile cues for proper technique Short Arc Quad: AAROM, Left, 15 reps, Seated Heel Slides: AAROM, Left, 15 reps, Seated Hip ABduction/ADduction: AAROM, Left, 15 reps, Seated Long Arc Quad: AAROM, Left, 15 reps, Seated    General Comments General comments (skin integrity, edema, etc.): Pt lunch tray arriving during session. Discussed with pt and family (daughter and grandaughter) on completing set of ther ex per TKA handout prior to afternoon PT session. Pt and family in agreement, family to assist as needed.      Pertinent Vitals/Pain Pain Assessment Pain Assessment: Faces Pain Score: 7  Faces Pain Scale: Hurts even more Pain Location: L knee with movement Pain Descriptors / Indicators: Operative site guarding, Sore, Grimacing, Guarding Pain Intervention(s): Limited activity within patient's tolerance, Monitored during session, Repositioned, Ice applied, Patient requesting pain meds-RN notified    Home Living                          Prior Function            PT Goals (current goals can now be found in the care plan section) Acute Rehab PT  Goals Patient Stated Goal: none stated PT Goal Formulation: With patient/family Time For Goal Achievement: 07/30/22 Potential to Achieve Goals: Good Progress towards PT goals: Progressing toward goals    Frequency    7X/week      PT Plan Current plan remains appropriate    Co-evaluation              AM-PAC PT "6 Clicks" Mobility   Outcome Measure  Help needed turning from your back to your side while in a flat bed without using bedrails?: A Lot Help needed moving from lying on your back to sitting on the side of a flat bed without using bedrails?: Total Help needed moving to and from a bed to a chair (including a wheelchair)?: Total Help needed standing up from a chair using your arms (e.g., wheelchair or bedside chair)?: Total Help needed to walk in hospital room?: Total Help needed climbing 3-5 steps with a railing? : Total 6 Click Score: 7    End of Session Equipment Utilized During Treatment: Gait belt Activity Tolerance: Patient tolerated treatment well Patient left: in chair;with call bell/phone within reach;with chair alarm set;with family/visitor present Nurse Communication: Mobility status;Other (comment) (+2 assist transfer to [facility] when able to tolerate) PT Visit Diagnosis: Other abnormalities of gait and mobility (R26.89);Muscle weakness (generalized) (M62.81);Other symptoms and signs involving the nervous system (  P32.951)     Time: 8841-6606 PT Time Calculation (min) (ACUTE ONLY): 38 min  Charges:  $Therapeutic Exercise: 8-22 mins $Therapeutic Activity: 23-37 mins                     Festus Barren PT, DPT  Acute Rehabilitation Services  Office [phone]   07/27/2022, 3:10 PM

## 2022-07-27 NOTE — Plan of Care (Signed)
  Problem: Activity: Goal: Ability to avoid complications of mobility impairment will improve Outcome: Progressing Goal: Range of joint motion will improve Outcome: Progressing   Problem: Pain Management: Goal: Pain level will decrease with appropriate interventions Outcome: Progressing   

## 2022-07-27 NOTE — Progress Notes (Addendum)
Physical Therapy Treatment Patient Details Name: [name] MRN: [id number] DOB: 1946-08-03 Today's Date: 07/27/2022   History of Present Illness Pt is a 76yo female presenting s/p L-TKA on 07/22/22. PMH: stroke, R-RCR, HLD, ankle fx/surgery, cognitive change    PT Comments    Pt is slowly progressing with acute PT goals with progression of forward ambulation distance. MOD A+2 required for power up to stand from recliner with cues for hand placement. Pt ambulated ~47f then 680fwith seated rest break between due to pain, fatigue, and generalized muscle weakness/decreased muscular endurance.  MIN A+2 with close chair follow provided during ambulation.  Continue to recommend short term rehab stay upon d/c. Pt will benefit from continued skilled PT to increase their independence and maximize safety with mobility.   Recommendations for follow up therapy are one component of a multi-disciplinary discharge planning process, led by the attending physician.  Recommendations may be updated based on patient status, additional functional criteria and insurance authorization.  Follow Up Recommendations  Skilled nursing-short term rehab (<3 hours/day) Can patient physically be transported by private vehicle: No   Assistance Recommended at Discharge Frequent or constant Supervision/Assistance  Patient can return home with the following Assistance with cooking/housework;Assist for transportation;Help with stairs or ramp for entrance;Direct supervision/assist for financial management;Direct supervision/assist for medications management;Two people to help with bathing/dressing/bathroom;Two people to help with walking and/or transfers   Equipment Recommendations  None recommended by PT    Recommendations for Other Services       Precautions / Restrictions Precautions Precautions: Fall;Knee Precaution Booklet Issued: Yes (comment) Precaution Comments: reviewed no pillow under knee Restrictions Weight  Bearing Restrictions: No LLE Weight Bearing: Weight bearing as tolerated Other Position/Activity Restrictions: WBAT     Mobility  Bed Mobility               General bed mobility comments: pt in recliner upon entry and returned to recliner at end of session to eat lunch    Transfers Overall transfer level: Needs assistance Equipment used: Rolling walker (2 wheels) Transfers: Sit to/from Stand Sit to Stand: Mod assist, +2 physical assistance, +2 safety/equipment           General transfer comment: x2; increased time to bring pt recliner to uprgiht and slowly lower B LEs to ground, MAX A to scoot hips to edge of chair with use of chuck pad. MOD A +2 for power up to stand with cues for hand placement- pt attempting to use B UEs on RW to pull up. Cues for optimal LE placement prior to standing, manual assist required for L knee flexion and to widen BOS. Assist required at pelvis for promtion of upright posture/hip extension.    Ambulation/Gait Ambulation/Gait assistance: Min assist, +2 physical assistance, +2 safety/equipment Gait Distance (Feet): 8 Feet Assistive device: Rolling walker (2 wheels) Gait Pattern/deviations: Step-to pattern, Decreased step length - right, Decreased step length - left, Decreased weight shift to left Gait velocity: decreased     General Gait Details: additional 7f48following seated rest break increased time to initiate steps and intermittent manula assit for RW progression forward. Cues for sequencing of steps. 3rd person utilized  (pt's granddaughter) to assist with close chair follow.   Stairs             Wheelchair Mobility    Modified Rankin (Stroke Patients Only)       Balance Overall balance assessment: Needs assistance Sitting-balance support: Single extremity supported, Feet supported Sitting balance-Leahy Scale: Fair  Postural control: Posterior lean Standing balance support: Reliant on assistive device for balance,  During functional activity, Bilateral upper extremity supported Standing balance-Leahy Scale: Poor Standing balance comment: reliant on UEs and external support, mod A for posterior lean                            Cognition Arousal/Alertness: Awake/alert Behavior During Therapy: Flat affect Overall Cognitive Status: Impaired/Different from baseline                         Following Commands: Follows one step commands with increased time     Problem Solving: Slow processing, Decreased initiation, Requires tactile cues, Requires verbal cues General Comments: increased time/repetition        Exercises Total Joint Exercises Ankle Circles/Pumps: AROM, Both, 10 reps, Seated    General Comments General comments (skin integrity, edema, etc.): Pt lunch tray arriving during session. Discussed with pt and family (daughter and grandaughter) on completing set of ther ex per TKA handout prior to afternoon PT session. Pt and family in agreement, family to assist as needed.      Pertinent Vitals/Pain Pain Assessment Pain Assessment: 0-10 Pain Score: 7  Pain Location: L knee with movement Pain Descriptors / Indicators: Operative site guarding, Sore, Guarding Pain Intervention(s): Limited activity within patient's tolerance, Monitored during session, Repositioned, Patient requesting pain meds-RN notified, Ice applied    Home Living                          Prior Function            PT Goals (current goals can now be found in the care plan section) Acute Rehab PT Goals Patient Stated Goal: none stated PT Goal Formulation: With patient/family Time For Goal Achievement: 07/30/22 Potential to Achieve Goals: Good Progress towards PT goals: Progressing toward goals    Frequency    7X/week      PT Plan Current plan remains appropriate    Co-evaluation              AM-PAC PT "6 Clicks" Mobility   Outcome Measure  Help needed turning from  your back to your side while in a flat bed without using bedrails?: A Lot Help needed moving from lying on your back to sitting on the side of a flat bed without using bedrails?: Total Help needed moving to and from a bed to a chair (including a wheelchair)?: Total Help needed standing up from a chair using your arms (e.g., wheelchair or bedside chair)?: Total Help needed to walk in hospital room?: Total Help needed climbing 3-5 steps with a railing? : Total 6 Click Score: 7    End of Session Equipment Utilized During Treatment: Gait belt Activity Tolerance: Patient tolerated treatment well Patient left: in chair;with call bell/phone within reach;with chair alarm set;with family/visitor present;with nursing/sitter in room Nurse Communication: Mobility status;Other (comment) (needs new purewick placement or +2 assist transfer to [facility] when able to tolerate) PT Visit Diagnosis: Other abnormalities of gait and mobility (R26.89);Muscle weakness (generalized) (M62.81);Other symptoms and signs involving the nervous system (R29.898)     Time: 1120-1200 PT Time Calculation (min) (ACUTE ONLY): 40 min  Charges:  $Therapeutic Activity: 38-52 mins                     Festus Barren PT, DPT  Acute Rehabilitation Services  Office [phone]  07/27/2022, 12:38 PM

## 2022-07-27 NOTE — Plan of Care (Signed)
  Problem: Pain Management: Goal: Pain level will decrease with appropriate interventions Outcome: Progressing   Problem: Nutrition: Goal: Adequate nutrition will be maintained Outcome: Progressing   Problem: Coping: Goal: Level of anxiety will decrease Outcome: Progressing

## 2022-07-27 NOTE — TOC Progression Note (Signed)
Transition of Care [facility]) - Progression Note    Patient Details  Name: [name] MRN: [id number] Date of Birth: September 27, 1946  Transition of Care [facility]) CM/SW Contact  Ross Ludwig, [place] Phone Number: 07/27/2022, 10:17 AM  Clinical Narrative:     CSW contacted [employer]TA to start insurance authorization for patient to go to [facility] on Monday.  CSW also requested EMS transport authorization.  CSW awaiting call back from insurance company.   Expected Discharge Plan: [place] Barriers to Discharge: Continued Medical Work up  Expected Discharge Plan and Services Expected Discharge Plan: [place] Choice: [place] arrangements for the past 2 months: Single Family Home                           HH Arranged: PT, OT HH Agency: [facility]         Social Determinants of Health (SDOH) Interventions    Readmission Risk Interventions     No data to display

## 2022-07-27 NOTE — Plan of Care (Signed)
  Problem: Pain Management: Goal: Pain level will decrease with appropriate interventions 07/27/2022 0254 by Jerrol Banana, RN Outcome: Progressing 07/27/2022 0250 by Jerrol Banana, RN Outcome: Progressing   Problem: Coping: Goal: Level of anxiety will decrease Outcome: Progressing

## 2022-07-28 MED ORDER — SENNA 8.6 MG PO TABS
1.0000 | ORAL_TABLET | Freq: Every day | ORAL | Status: DC
  Administered 2022-07-28 – 2022-07-29 (×2): 8.6 mg via ORAL
  Filled 2022-07-28 (×2): qty 1

## 2022-07-28 MED ORDER — POLYETHYLENE GLYCOL 3350 17 G PO PACK
17.0000 g | PACK | Freq: Every day | ORAL | Status: DC
  Administered 2022-07-28: 17 g via ORAL
  Filled 2022-07-28: qty 1

## 2022-07-28 MED ORDER — MAGNESIUM CITRATE PO SOLN
1.0000 | Freq: Once | ORAL | Status: AC
  Administered 2022-07-28: 1 via ORAL
  Filled 2022-07-28: qty 296

## 2022-07-28 MED ORDER — MAGNESIUM CITRATE PO SOLN: 1.0000 | Freq: Once | ORAL | Status: DC

## 2022-07-28 NOTE — Progress Notes (Signed)
Physical Therapy Treatment Patient Details Name: [name] MRN: [id number] DOB: Nov 22, 1945 Today's Date: 07/28/2022   History of Present Illness Pt is a 76yo female presenting s/p L-TKA on 07/22/22. PMH: stroke, R-RCR, HLD, ankle fx/surgery, cognitive change    PT Comments    Pt continues to progress with mobility, able to ambulate 89f, 856fwith MIN A+1- additional person provided for safety/chair follow. Seated rest break provided between. Limited with further distance this afternoon due to increased pain levels, RN notified. Continues to required MOD A+2 for power up to stand and MOD A for correction of posterior lean upon standing prior to forward ambulation. Increased time required for all mobility with multiple rest breaks due to fatigue, generalized weakness, and L LE pain. Pt will benefit from continued skilled PT to increase their independence and maximize safety with mobility.     Recommendations for follow up therapy are one component of a multi-disciplinary discharge planning process, led by the attending physician.  Recommendations may be updated based on patient status, additional functional criteria and insurance authorization.  Follow Up Recommendations  Skilled nursing-short term rehab (<3 hours/day) Can patient physically be transported by private vehicle: No   Assistance Recommended at Discharge Frequent or constant Supervision/Assistance  Patient can return home with the following Assistance with cooking/housework;Assist for transportation;Help with stairs or ramp for entrance;Direct supervision/assist for financial management;Direct supervision/assist for medications management;Two people to help with bathing/dressing/bathroom;Two people to help with walking and/or transfers   Equipment Recommendations  None recommended by PT    Recommendations for Other Services       Precautions / Restrictions Precautions Precautions: Fall;Knee Precaution Booklet Issued: Yes  (comment) Precaution Comments: reviewed no pillow under knee Restrictions Weight Bearing Restrictions: No Other Position/Activity Restrictions: WBAT     Mobility  Bed Mobility Overal bed mobility: Needs Assistance Bed Mobility: Supine to Sit     Supine to sit: Mod assist, HOB elevated     General bed mobility comments: pt in recliner upon entry and returned to recliner at end of session    Transfers Overall transfer level: Needs assistance Equipment used: Rolling walker (2 wheels) Transfers: Sit to/from Stand Sit to Stand: Mod assist, +2 physical assistance, +2 safety/equipment           General transfer comment: x2; MOD A +2 for power up to stand. demonstrated good carryover for handplacement with reminders. Pt able to identify when LEs not in optimal positin prior to transfer and correct with increased time. Pt with posterior lean upon standing assist required at pelvis for promtion of upright posture/hip extension for full stand on 2nd stand.    Ambulation/Gait Ambulation/Gait assistance: Min assist, +2 safety/equipment Gait Distance (Feet): 10 Feet Assistive device: Rolling walker (2 wheels) Gait Pattern/deviations: Step-to pattern, Decreased step length - right, Decreased step length - left, Decreased weight shift to left Gait velocity: decreased     General Gait Details: additional [id number] seated rest break. Cues for sequencing of steps.close chair follow provided.intermittent manual assist for RW progression. cues for increased step length and DF, intermittently walking on forefoot.   Stairs             Wheelchair Mobility    Modified Rankin (Stroke Patients Only)       Balance Overall balance assessment: Needs assistance Sitting-balance support: Single extremity supported, Feet supported Sitting balance-Leahy Scale: Fair   Postural control: Posterior lean Standing balance support: Reliant on assistive device for balance, During functional  activity, Bilateral upper extremity  supported Standing balance-Leahy Scale: Poor Standing balance comment: reliant on UEs and external support, mod A for correction of posterior lean                            Cognition Arousal/Alertness: Awake/alert Behavior During Therapy: Flat affect Overall Cognitive Status: Impaired/Different from baseline                         Following Commands: Follows one step commands with increased time     Problem Solving: Slow processing, Decreased initiation, Requires tactile cues, Requires verbal cues General Comments: increased time/repetition. pt HOH- hears better out of R ear        Exercises Total Joint Exercises Ankle Circles/Pumps: AROM, Both, Seated, 20 reps Quad Sets: AROM, Left, Seated, Limitations, 10 reps Short Arc Quad: AAROM, Left, Seated, 10 reps Heel Slides: AAROM, Seated, 10 reps, Left Hip ABduction/ADduction: AAROM, Left, 15 reps, Seated Straight Leg Raises: AAROM, Left, 10 reps, Seated Long Arc Quad: AAROM, Left, Seated (12 reps)    General Comments        Pertinent Vitals/Pain Pain Assessment Pain Assessment: 0-10 Pain Score: 7  Pain Location: L knee with movement Pain Descriptors / Indicators: Sore, Guarding, Discomfort Pain Intervention(s): Limited activity within patient's tolerance, Monitored during session, Repositioned, Ice applied    Home Living                          Prior Function            PT Goals (current goals can now be found in the care plan section) Acute Rehab PT Goals Patient Stated Goal: none stated PT Goal Formulation: With patient/family Time For Goal Achievement: 07/30/22 Potential to Achieve Goals: Good Progress towards PT goals: Progressing toward goals    Frequency    7X/week      PT Plan Current plan remains appropriate    Co-evaluation              AM-PAC PT "6 Clicks" Mobility   Outcome Measure  Help needed turning from your  back to your side while in a flat bed without using bedrails?: A Lot Help needed moving from lying on your back to sitting on the side of a flat bed without using bedrails?: A Lot Help needed moving to and from a bed to a chair (including a wheelchair)?: Total Help needed standing up from a chair using your arms (e.g., wheelchair or bedside chair)?: Total Help needed to walk in hospital room?: A Little Help needed climbing 3-5 steps with a railing? : Total 6 Click Score: 10    End of Session Equipment Utilized During Treatment: Gait belt Activity Tolerance: Patient tolerated treatment well Patient left: in chair;with call bell/phone within reach;with chair alarm set;with family/visitor present Nurse Communication: Mobility status PT Visit Diagnosis: Other abnormalities of gait and mobility (R26.89);Muscle weakness (generalized) (M62.81);Other symptoms and signs involving the nervous system (E17.408)     Time: 1448-1856 PT Time Calculation (min) (ACUTE ONLY): 41 min  Charges:  $Therapeutic Exercise: 8-22 mins $Therapeutic Activity: 23-37 mins                     Festus Barren PT, DPT  Acute Rehabilitation Services  Office [phone]   07/28/2022, 3:45 PM

## 2022-07-28 NOTE — TOC Progression Note (Signed)
Transition of Care [facility]) - Progression Note    Patient Details  Name: [name] MRN: [id number] Date of Birth: November 15, 1945  Transition of Care [facility]) CM/SW Contact  Ross Ludwig, [place] Phone Number: 07/28/2022, 5:49 PM  Clinical Narrative:     Patient will need a new Covid test prior to discharge to [facility].  CSW asked bedside nurse to see if she can get it ordered.  Insurance authorization is still pending.    Expected Discharge Plan: [place] Barriers to Discharge: Continued Medical Work up  Expected Discharge Plan and Services Expected Discharge Plan: [place] Choice: [place] arrangements for the past 2 months: Single Family Home                           HH Arranged: PT, OT HH Agency: [place]         Social Determinants of Health (SDOH) Interventions    Readmission Risk Interventions     No data to display

## 2022-07-28 NOTE — Progress Notes (Cosign Needed Addendum)
Subjective: Continues to progress slowly with therapies. Was able to ambulate ~8 ft yesterday and then 16 ft during afternoon PT session.  No new issues.  Pain controlled.  Denies distal numbness and tingling.  Patient and family have decided on SNF versus discharge home. Patient with no BM x 7 days. Has had issues with constipation in the past. Has received 2 doses of Miralax without producing successful BM  Objective:   VITALS:   Vitals:   07/27/22 1003 07/27/22 1819 07/27/22 2238 07/28/22 0613  BP: 110/76 129/75 112/73 117/61  Pulse: 80 93 69 (!) 59  Resp: '17 18 15 18  '$ Temp: 97.8 F (36.6 C) 98.9 F (37.2 C) 98.6 F (37 C) 97.7 F (36.5 C)  TempSrc: Oral Oral Oral   SpO2: 95% 95% 100% 100%  Weight:      Height:        Sensation intact distally Intact pulses distally Dorsiflexion/Plantar flexion intact Incision: dressing C/D/I Compartment soft   Lab Results  Component Value Date   WBC 11.3 (H) 07/25/2022   HGB 10.3 (L) 07/25/2022   HCT 31.9 (L) 07/25/2022   MCV 99.4 07/25/2022   PLT 179 07/25/2022   BMET    Component Value Date/Time   NA 136 07/23/2022 0313   NA 140 05/27/2017 1553   K 4.0 07/23/2022 0313   CL 102 07/23/2022 0313   CO2 28 07/23/2022 0313   GLUCOSE 142 (H) 07/23/2022 0313   BUN 16 07/23/2022 0313   BUN 15 05/27/2017 1553   CREATININE 0.78 07/23/2022 0313   CALCIUM 8.8 (L) 07/23/2022 0313   GFRNONAA >60 07/23/2022 0313    Xray: Left total knee arthroplasty components good position no adverse features  Assessment/Plan: 6 Days Post-Op   Principal Problem:   Localized osteoarthritis of left knee  Status post left total knee arthroplasty 07/22/2022  Post op recs: WB: WBAT Abx: continue ancef in house. Will discharge with 7 days cefadroxil 500BID given patient's hx of LE cellulitis. DVT prophylaxis: Aspirin '81mg'$  BID x4 weeks Dispo: Continue with therapies as tolerated. Add single dose of Mag citrate today followed by daily Senokot for  constipation. PT/OT recommending SNF which patient is agreeable to. Insurance authorization has been started for patient to go to [facility] SNF hopefully on Monday.    Follow up: 2 weeks after surgery for wound check with Dr. Zachery Dakins at [facility].  Address: [address], [place], [place] [address]  Office Phone: [phone]    Gwinda Passe PA-C [facility] [phone] (office) [email or web address]   Contact information:   After hours and holidays please check [email or web address] for group call information for [facility]

## 2022-07-28 NOTE — Plan of Care (Signed)

## 2022-07-28 NOTE — Plan of Care (Signed)
  Problem: Pain Management: Goal: Pain level will decrease with appropriate interventions Outcome: Progressing   Problem: Skin Integrity: Goal: Will show signs of wound healing Outcome: Progressing   Problem: Clinical Measurements: Goal: Cardiovascular complication will be avoided Outcome: Progressing   Problem: Clinical Measurements: Goal: Respiratory complications will improve Outcome: Progressing

## 2022-07-28 NOTE — Progress Notes (Signed)
Physical Therapy Treatment Patient Details Name: [name] MRN: [id number] DOB: February 16, 1946 Today's Date: 07/28/2022   History of Present Illness Pt is a 76yo female presenting s/p L-TKA on 07/22/22. PMH: stroke, R-RCR, HLD, ankle fx/surgery, cognitive change    PT Comments    Pt continues to progress with acute PT goals with progression of forward ambulation distance. She continues to require MOD A+2 for power up to stand from recliner with repeated cues for hand placement. Pt ambulated ~34f and additional 190fwith MIN A+2, able to progress to MIN A +1 on 2nd bout of ambulation with close chair follow provided. Intermittent UE tremors still observed, seems to be improved in intensity/frequency compared to previous session. Continue to recommend short term rehab stay upon d/c. Pt will benefit from continued skilled PT to increase independence and maximize safety with mobility.    Recommendations for follow up therapy are one component of a multi-disciplinary discharge planning process, led by the attending physician.  Recommendations may be updated based on patient status, additional functional criteria and insurance authorization.  Follow Up Recommendations  Skilled nursing-short term rehab (<3 hours/day) Can patient physically be transported by private vehicle: No   Assistance Recommended at Discharge Frequent or constant Supervision/Assistance  Patient can return home with the following Assistance with cooking/housework;Assist for transportation;Help with stairs or ramp for entrance;Direct supervision/assist for financial management;Direct supervision/assist for medications management;Two people to help with bathing/dressing/bathroom;Two people to help with walking and/or transfers   Equipment Recommendations  None recommended by PT    Recommendations for Other Services       Precautions / Restrictions Precautions Precautions: Fall;Knee Precaution Booklet Issued: Yes  (comment) Precaution Comments: reviewed no pillow under knee Restrictions Weight Bearing Restrictions: No Other Position/Activity Restrictions: WBAT     Mobility  Bed Mobility Overal bed mobility: Needs Assistance Bed Mobility: Supine to Sit     Supine to sit: Mod assist, HOB elevated     General bed mobility comments: MOD A- able to initiate progression of LEs to EOB, required assist to complete L LE to EOB and to bring trukn to upright. MAX A to swcoot hips to EOB with use of chuck pad    Transfers Overall transfer level: Needs assistance Equipment used: Rolling walker (2 wheels) Transfers: Sit to/from Stand Sit to Stand: Mod assist, +2 physical assistance, +2 safety/equipment           General transfer comment: x2; MOD A +2 for power up to stand with repeated cues for hand placement- pt attempting to use B UEs on RW to pull up. Cues for optimal LE placement prior to standing, pt able to move actively with increased time. Pt with posteriro lean upon standing assist required at pelvis for promtion of upright posture/hip extension for full stand on 2nd stand.    Ambulation/Gait Ambulation/Gait assistance: Min assist, +2 physical assistance, +2 safety/equipment Gait Distance (Feet): 16 Feet Assistive device: Rolling walker (2 wheels) Gait Pattern/deviations: Step-to pattern, Decreased step length - right, Decreased step length - left, Decreased weight shift to left Gait velocity: decreased     General Gait Details: additional 1080following seated rest break. Cues for sequencing of steps. Able to progress to MIN A+1 with close chair follow provided.intermittent manual assist for RW progression. cues for increased step length.   Stairs             Wheelchair Mobility    Modified Rankin (Stroke Patients Only)       Balance Overall balance  assessment: Needs assistance Sitting-balance support: Single extremity supported, Feet supported Sitting balance-Leahy  Scale: Fair   Postural control: Posterior lean Standing balance support: Reliant on assistive device for balance, During functional activity, Bilateral upper extremity supported Standing balance-Leahy Scale: Poor Standing balance comment: reliant on UEs and external support, mod A for correction of posterior lean                            Cognition Arousal/Alertness: Awake/alert Behavior During Therapy: Flat affect Overall Cognitive Status: Impaired/Different from baseline                         Following Commands: Follows one step commands with increased time     Problem Solving: Slow processing, Decreased initiation, Requires tactile cues, Requires verbal cues General Comments: increased time/repetition. pt HOH- hears better out of R ear        Exercises Total Joint Exercises Ankle Circles/Pumps: AROM, Both, Seated, 20 reps Quad Sets: AROM, Left, Seated, Limitations, 10 reps Short Arc Quad: AAROM, Left, Seated, 10 reps Heel Slides: AAROM, Seated, 10 reps, Left Hip ABduction/ADduction: AAROM, Left, 15 reps, Seated Straight Leg Raises: AAROM, Left, 10 reps, Seated Long Arc Quad: AAROM, Left, Seated, 10 reps    General Comments        Pertinent Vitals/Pain Pain Assessment Pain Assessment: 0-10 Pain Score: 5  Pain Location: L knee with movement Pain Descriptors / Indicators: Operative site guarding, Sore, Guarding Pain Intervention(s): Limited activity within patient's tolerance, Monitored during session, Repositioned    Home Living                          Prior Function            PT Goals (current goals can now be found in the care plan section) Acute Rehab PT Goals Patient Stated Goal: none stated PT Goal Formulation: With patient/family Time For Goal Achievement: 07/30/22 Potential to Achieve Goals: Good Progress towards PT goals: Progressing toward goals    Frequency    7X/week      PT Plan Current plan remains  appropriate    Co-evaluation              AM-PAC PT "6 Clicks" Mobility   Outcome Measure  Help needed turning from your back to your side while in a flat bed without using bedrails?: A Lot Help needed moving from lying on your back to sitting on the side of a flat bed without using bedrails?: A Lot Help needed moving to and from a bed to a chair (including a wheelchair)?: Total Help needed standing up from a chair using your arms (e.g., wheelchair or bedside chair)?: Total Help needed to walk in hospital room?: Total Help needed climbing 3-5 steps with a railing? : Total 6 Click Score: 8    End of Session Equipment Utilized During Treatment: Gait belt Activity Tolerance: Patient tolerated treatment well Patient left: in chair;with call bell/phone within reach;with chair alarm set Nurse Communication: Mobility status PT Visit Diagnosis: Other abnormalities of gait and mobility (R26.89);Muscle weakness (generalized) (M62.81);Other symptoms and signs involving the nervous system (O84.166)     Time: 0630-1601 PT Time Calculation (min) (ACUTE ONLY): 44 min  Charges:  $Therapeutic Exercise: 8-22 mins $Therapeutic Activity: 23-37 mins                     Raekwan Spelman  Darreld Mclean PT, DPT  Acute Rehabilitation Services  Office [phone]  07/28/2022, 12:22 PM

## 2022-07-28 NOTE — Plan of Care (Signed)
  Problem: Pain Management: Goal: Pain level will decrease with appropriate interventions Outcome: Progressing   Problem: Activity: Goal: Risk for activity intolerance will decrease Outcome: Progressing   Problem: Nutrition: Goal: Adequate nutrition will be maintained Outcome: Progressing   Problem: Coping: Goal: Level of anxiety will decrease Outcome: Progressing

## 2022-07-28 NOTE — Progress Notes (Signed)
Provider Rushie Nyhan notified of pt concern for constipation. Medications ordered and placed in EMR. Rn will continue to monitor.

## 2022-07-29 ENCOUNTER — Telehealth: Payer: Self-pay | Admitting: Psychiatry

## 2022-07-29 DIAGNOSIS — R29818 Other symptoms and signs involving the nervous system: Secondary | ICD-10-CM | POA: Diagnosis not present

## 2022-07-29 DIAGNOSIS — N179 Acute kidney failure, unspecified: Secondary | ICD-10-CM | POA: Diagnosis not present

## 2022-07-29 DIAGNOSIS — R262 Difficulty in walking, not elsewhere classified: Secondary | ICD-10-CM | POA: Diagnosis not present

## 2022-07-29 DIAGNOSIS — M1990 Unspecified osteoarthritis, unspecified site: Secondary | ICD-10-CM | POA: Diagnosis not present

## 2022-07-29 DIAGNOSIS — F5101 Primary insomnia: Secondary | ICD-10-CM | POA: Diagnosis not present

## 2022-07-29 DIAGNOSIS — F419 Anxiety disorder, unspecified: Secondary | ICD-10-CM | POA: Diagnosis not present

## 2022-07-29 DIAGNOSIS — M9712XS Periprosthetic fracture around internal prosthetic left knee joint, sequela: Secondary | ICD-10-CM | POA: Diagnosis not present

## 2022-07-29 DIAGNOSIS — N3281 Overactive bladder: Secondary | ICD-10-CM | POA: Diagnosis not present

## 2022-07-29 DIAGNOSIS — R5381 Other malaise: Secondary | ICD-10-CM | POA: Diagnosis not present

## 2022-07-29 DIAGNOSIS — D649 Anemia, unspecified: Secondary | ICD-10-CM | POA: Diagnosis not present

## 2022-07-29 DIAGNOSIS — H9113 Presbycusis, bilateral: Secondary | ICD-10-CM | POA: Diagnosis not present

## 2022-07-29 DIAGNOSIS — I480 Paroxysmal atrial fibrillation: Secondary | ICD-10-CM | POA: Diagnosis not present

## 2022-07-29 DIAGNOSIS — Z886 Allergy status to analgesic agent status: Secondary | ICD-10-CM | POA: Diagnosis not present

## 2022-07-29 DIAGNOSIS — G8929 Other chronic pain: Secondary | ICD-10-CM | POA: Diagnosis not present

## 2022-07-29 DIAGNOSIS — S86812A Strain of other muscle(s) and tendon(s) at lower leg level, left leg, initial encounter: Secondary | ICD-10-CM | POA: Diagnosis not present

## 2022-07-29 DIAGNOSIS — Z96652 Presence of left artificial knee joint: Secondary | ICD-10-CM | POA: Diagnosis not present

## 2022-07-29 DIAGNOSIS — S76112A Strain of left quadriceps muscle, fascia and tendon, initial encounter: Secondary | ICD-10-CM | POA: Diagnosis not present

## 2022-07-29 DIAGNOSIS — D62 Acute posthemorrhagic anemia: Secondary | ICD-10-CM | POA: Diagnosis not present

## 2022-07-29 DIAGNOSIS — Z8673 Personal history of transient ischemic attack (TIA), and cerebral infarction without residual deficits: Secondary | ICD-10-CM | POA: Diagnosis not present

## 2022-07-29 DIAGNOSIS — Y92239 Unspecified place in hospital as the place of occurrence of the external cause: Secondary | ICD-10-CM | POA: Diagnosis present

## 2022-07-29 DIAGNOSIS — J45909 Unspecified asthma, uncomplicated: Secondary | ICD-10-CM | POA: Diagnosis not present

## 2022-07-29 DIAGNOSIS — Z743 Need for continuous supervision: Secondary | ICD-10-CM | POA: Diagnosis not present

## 2022-07-29 DIAGNOSIS — G4700 Insomnia, unspecified: Secondary | ICD-10-CM | POA: Diagnosis not present

## 2022-07-29 DIAGNOSIS — I639 Cerebral infarction, unspecified: Secondary | ICD-10-CM | POA: Diagnosis not present

## 2022-07-29 DIAGNOSIS — Z741 Need for assistance with personal care: Secondary | ICD-10-CM | POA: Diagnosis not present

## 2022-07-29 DIAGNOSIS — F411 Generalized anxiety disorder: Secondary | ICD-10-CM | POA: Diagnosis not present

## 2022-07-29 DIAGNOSIS — M25569 Pain in unspecified knee: Secondary | ICD-10-CM | POA: Diagnosis not present

## 2022-07-29 DIAGNOSIS — R4189 Other symptoms and signs involving cognitive functions and awareness: Secondary | ICD-10-CM | POA: Diagnosis not present

## 2022-07-29 DIAGNOSIS — Z79899 Other long term (current) drug therapy: Secondary | ICD-10-CM | POA: Diagnosis not present

## 2022-07-29 DIAGNOSIS — F0150 Vascular dementia without behavioral disturbance: Secondary | ICD-10-CM | POA: Diagnosis not present

## 2022-07-29 DIAGNOSIS — K219 Gastro-esophageal reflux disease without esophagitis: Secondary | ICD-10-CM | POA: Diagnosis not present

## 2022-07-29 DIAGNOSIS — F418 Other specified anxiety disorders: Secondary | ICD-10-CM | POA: Diagnosis not present

## 2022-07-29 DIAGNOSIS — S82002A Unspecified fracture of left patella, initial encounter for closed fracture: Secondary | ICD-10-CM | POA: Diagnosis not present

## 2022-07-29 DIAGNOSIS — I10 Essential (primary) hypertension: Secondary | ICD-10-CM | POA: Diagnosis not present

## 2022-07-29 DIAGNOSIS — Z8616 Personal history of COVID-19: Secondary | ICD-10-CM | POA: Diagnosis not present

## 2022-07-29 DIAGNOSIS — F32A Depression, unspecified: Secondary | ICD-10-CM | POA: Diagnosis not present

## 2022-07-29 DIAGNOSIS — Z882 Allergy status to sulfonamides status: Secondary | ICD-10-CM | POA: Diagnosis not present

## 2022-07-29 DIAGNOSIS — E785 Hyperlipidemia, unspecified: Secondary | ICD-10-CM | POA: Diagnosis not present

## 2022-07-29 DIAGNOSIS — I4891 Unspecified atrial fibrillation: Secondary | ICD-10-CM | POA: Diagnosis not present

## 2022-07-29 DIAGNOSIS — N393 Stress incontinence (female) (male): Secondary | ICD-10-CM | POA: Diagnosis not present

## 2022-07-29 DIAGNOSIS — M6281 Muscle weakness (generalized): Secondary | ICD-10-CM | POA: Diagnosis not present

## 2022-07-29 DIAGNOSIS — M1712 Unilateral primary osteoarthritis, left knee: Secondary | ICD-10-CM | POA: Diagnosis not present

## 2022-07-29 DIAGNOSIS — F332 Major depressive disorder, recurrent severe without psychotic features: Secondary | ICD-10-CM | POA: Diagnosis not present

## 2022-07-29 DIAGNOSIS — L03116 Cellulitis of left lower limb: Secondary | ICD-10-CM | POA: Diagnosis not present

## 2022-07-29 DIAGNOSIS — K5909 Other constipation: Secondary | ICD-10-CM | POA: Diagnosis not present

## 2022-07-29 DIAGNOSIS — U071 COVID-19: Secondary | ICD-10-CM | POA: Diagnosis not present

## 2022-07-29 DIAGNOSIS — F339 Major depressive disorder, recurrent, unspecified: Secondary | ICD-10-CM | POA: Diagnosis not present

## 2022-07-29 DIAGNOSIS — R278 Other lack of coordination: Secondary | ICD-10-CM | POA: Diagnosis not present

## 2022-07-29 DIAGNOSIS — Z23 Encounter for immunization: Secondary | ICD-10-CM | POA: Diagnosis not present

## 2022-07-29 DIAGNOSIS — Z471 Aftercare following joint replacement surgery: Secondary | ICD-10-CM | POA: Diagnosis not present

## 2022-07-29 DIAGNOSIS — R279 Unspecified lack of coordination: Secondary | ICD-10-CM | POA: Diagnosis not present

## 2022-07-29 DIAGNOSIS — M542 Cervicalgia: Secondary | ICD-10-CM | POA: Diagnosis not present

## 2022-07-29 DIAGNOSIS — H900 Conductive hearing loss, bilateral: Secondary | ICD-10-CM | POA: Diagnosis not present

## 2022-07-29 DIAGNOSIS — M5459 Other low back pain: Secondary | ICD-10-CM | POA: Diagnosis not present

## 2022-07-29 DIAGNOSIS — G2401 Drug induced subacute dyskinesia: Secondary | ICD-10-CM | POA: Diagnosis not present

## 2022-07-29 DIAGNOSIS — I509 Heart failure, unspecified: Secondary | ICD-10-CM | POA: Diagnosis not present

## 2022-07-29 DIAGNOSIS — M9712XA Periprosthetic fracture around internal prosthetic left knee joint, initial encounter: Secondary | ICD-10-CM | POA: Diagnosis not present

## 2022-07-29 DIAGNOSIS — R488 Other symbolic dysfunctions: Secondary | ICD-10-CM | POA: Diagnosis not present

## 2022-07-29 DIAGNOSIS — M159 Polyosteoarthritis, unspecified: Secondary | ICD-10-CM | POA: Diagnosis not present

## 2022-07-29 DIAGNOSIS — G43809 Other migraine, not intractable, without status migrainosus: Secondary | ICD-10-CM | POA: Diagnosis not present

## 2022-07-29 DIAGNOSIS — H9190 Unspecified hearing loss, unspecified ear: Secondary | ICD-10-CM | POA: Diagnosis not present

## 2022-07-29 DIAGNOSIS — Z86718 Personal history of other venous thrombosis and embolism: Secondary | ICD-10-CM | POA: Diagnosis not present

## 2022-07-29 DIAGNOSIS — I959 Hypotension, unspecified: Secondary | ICD-10-CM | POA: Diagnosis not present

## 2022-07-29 DIAGNOSIS — G894 Chronic pain syndrome: Secondary | ICD-10-CM | POA: Diagnosis not present

## 2022-07-29 DIAGNOSIS — M9712XD Periprosthetic fracture around internal prosthetic left knee joint, subsequent encounter: Secondary | ICD-10-CM | POA: Diagnosis not present

## 2022-07-29 DIAGNOSIS — R2681 Unsteadiness on feet: Secondary | ICD-10-CM | POA: Diagnosis not present

## 2022-07-29 DIAGNOSIS — W19XXXA Unspecified fall, initial encounter: Secondary | ICD-10-CM | POA: Diagnosis present

## 2022-07-29 LAB — SARS CORONAVIRUS 2 (TAT 6-24 HRS): SARS Coronavirus 2: NEGATIVE

## 2022-07-29 MED ORDER — CEFADROXIL 500 MG PO CAPS: 500.0000 mg | ORAL_CAPSULE | Freq: Two times a day (BID) | ORAL | 0 refills | Status: AC

## 2022-07-29 MED ORDER — CEFADROXIL 500 MG PO CAPS
500.0000 mg | ORAL_CAPSULE | Freq: Two times a day (BID) | ORAL | Status: DC
  Administered 2022-07-29: 500 mg via ORAL
  Filled 2022-07-29: qty 1

## 2022-07-29 MED ORDER — ONDANSETRON HCL 4 MG PO TABS: 4.0000 mg | ORAL_TABLET | Freq: Three times a day (TID) | ORAL | 0 refills | Status: AC | PRN

## 2022-07-29 MED ORDER — ASPIRIN 81 MG PO TBEC: 81.0000 mg | DELAYED_RELEASE_TABLET | Freq: Two times a day (BID) | ORAL | 0 refills | Status: DC

## 2022-07-29 MED ORDER — OXYCODONE HCL 5 MG PO TABS: 5.0000 mg | ORAL_TABLET | ORAL | 0 refills | Status: AC | PRN

## 2022-07-29 NOTE — TOC Transition Note (Signed)
Transition of Care [facility]) - CM/SW Discharge Note   Patient Details  Name: [name] MRN: [id number] Date of Birth: 02/15/1946  [facility] CM/SW Contact:  Lennart Pall, LCSW Phone Number: 07/29/2022, 2:26 PM   Clinical Narrative:    Pt medically cleared for dc today to SNF.  Have received insurance authorization for [facility] [id number]) and for [facility] transport [phone]).  [facility] called at 1:55pm.  RN to call report to [phone].  No further TOC needs.   Final next level of care: Skilled Nursing Facility Barriers to Discharge: Barriers Resolved   Patient Goals and CMS Choice Patient states their goals for this hospitalization and ongoing recovery are:: To return back home with home health.      Discharge Placement              Patient chooses bed at: [facility] Patient to be transferred to facility by: [place] Name of family member notified: spouse Patient and family notified of of transfer: 07/29/22  Discharge Plan and Services     Post Acute Care Choice: Home Health          DME Arranged: N/A DME Agency: NA       HH Arranged: NA HH Agency: NA        Social Determinants of Health (SDOH) Interventions     Readmission Risk Interventions     No data to display

## 2022-07-29 NOTE — Progress Notes (Signed)
Physical Therapy Treatment Patient Details Name: [name] MRN: [id number] DOB: 09/10/1946 Today's Date: 07/29/2022   History of Present Illness Pt is a 76yo female presenting s/p L-TKA on 07/22/22. PMH: stroke, R-RCR, HLD, ankle fx/surgery, cognitive change    PT Comments    Pt up in recliner at start of session. Mod assist of 2 for sit to stand, with increased time and max verbal cues for UE/LE placement. Pt continues to have a posterior lean in standing requiring assistance to prevent fall. Pt ambulated 12' with RW, distance limited by pain and fatigue. Performed L TKA exercises with assist. Pt continues to be slow to progress with mobility, ST-SNF recommended.     Recommendations for follow up therapy are one component of a multi-disciplinary discharge planning process, led by the attending physician.  Recommendations may be updated based on patient status, additional functional criteria and insurance authorization.  Follow Up Recommendations  Skilled nursing-short term rehab (<3 hours/day) Can patient physically be transported by private vehicle: No   Assistance Recommended at Discharge Frequent or constant Supervision/Assistance  Patient can return home with the following Assistance with cooking/housework;Assist for transportation;Help with stairs or ramp for entrance;Direct supervision/assist for financial management;Direct supervision/assist for medications management;Two people to help with bathing/dressing/bathroom;Two people to help with walking and/or transfers   Equipment Recommendations  None recommended by PT    Recommendations for Other Services       Precautions / Restrictions Precautions Precautions: Fall;Knee Precaution Booklet Issued: Yes (comment) Precaution Comments: reviewed no pillow under knee Restrictions Weight Bearing Restrictions: No LLE Weight Bearing: Weight bearing as tolerated Other Position/Activity Restrictions: WBAT     Mobility  Bed  Mobility               General bed mobility comments: up in recliner    Transfers Overall transfer level: Needs assistance Equipment used: Rolling walker (2 wheels) Transfers: Sit to/from Stand Sit to Stand: Mod assist, +2 physical assistance, +2 safety/equipment           General transfer comment: MOD A +2 for power up to stand with repeated cues for hand placement- pt attempting to use B UEs on RW to pull up. Cues for optimal LE placement prior to standing, pt able to move actively with increased time. Pt with posterior lean upon standing assist required at pelvis for promtion of upright posture/hip extension for full stand on 2nd stand.    Ambulation/Gait Ambulation/Gait assistance: Min assist, +2 physical assistance, +2 safety/equipment Gait Distance (Feet): 12 Feet Assistive device: Rolling walker (2 wheels) Gait Pattern/deviations: Step-to pattern, Decreased step length - right, Decreased step length - left, Decreased weight shift to left Gait velocity: decreased     General Gait Details: VCs for posture and to increase step length, distance limited by pain/fatigue   Stairs             Wheelchair Mobility    Modified Rankin (Stroke Patients Only)       Balance Overall balance assessment: Needs assistance Sitting-balance support: Single extremity supported, Feet supported Sitting balance-Leahy Scale: Fair   Postural control: Posterior lean Standing balance support: Reliant on assistive device for balance, During functional activity, Bilateral upper extremity supported Standing balance-Leahy Scale: Poor Standing balance comment: reliant on UEs and external support, mod A for correction of posterior lean                            Cognition Arousal/Alertness: Awake/alert Behavior  During Therapy: Flat affect Overall Cognitive Status: Impaired/Different from baseline                         Following Commands: Follows one step  commands with increased time     Problem Solving: Slow processing, Decreased initiation, Requires tactile cues, Requires verbal cues General Comments: increased time/repetition. pt HOH- hears better out of R ear        Exercises Total Joint Exercises Ankle Circles/Pumps: AROM, Both, Seated, 20 reps Quad Sets: AROM, Left, Limitations, 10 reps, Supine Quad Sets Limitations: repeated verbal/tactile cues for proper technique, very weak quad contraction Short Arc Quad: AAROM, Left, 10 reps, Supine Heel Slides: AAROM, Left, 15 reps, Supine Hip ABduction/ADduction: AAROM, Left, 10 reps, Supine Long Arc Quad: AAROM, Left, Seated, 10 reps (12 reps) Knee Flexion: AAROM, Left, Seated, 10 reps Goniometric ROM: 5-45* AAROM L knee    General Comments        Pertinent Vitals/Pain Pain Assessment Pain Score: 7  Pain Location: L knee with movement Pain Descriptors / Indicators: Sore, Guarding, Discomfort Pain Intervention(s): Limited activity within patient's tolerance, Monitored during session, Repositioned, Ice applied    Home Living                          Prior Function            PT Goals (current goals can now be found in the care plan section) Acute Rehab PT Goals Patient Stated Goal: none stated PT Goal Formulation: With patient/family Time For Goal Achievement: 07/30/22 Potential to Achieve Goals: Good Progress towards PT goals: Progressing toward goals    Frequency    7X/week      PT Plan Current plan remains appropriate    Co-evaluation              AM-PAC PT "6 Clicks" Mobility   Outcome Measure  Help needed turning from your back to your side while in a flat bed without using bedrails?: A Lot Help needed moving from lying on your back to sitting on the side of a flat bed without using bedrails?: A Lot Help needed moving to and from a bed to a chair (including a wheelchair)?: A Lot Help needed standing up from a chair using your arms (e.g.,  wheelchair or bedside chair)?: A Lot Help needed to walk in hospital room?: A Little Help needed climbing 3-5 steps with a railing? : Total 6 Click Score: 12    End of Session Equipment Utilized During Treatment: Gait belt Activity Tolerance: Patient tolerated treatment well Patient left: in chair;with call bell/phone within reach;with chair alarm set Nurse Communication: Mobility status PT Visit Diagnosis: Other abnormalities of gait and mobility (R26.89);Muscle weakness (generalized) (M62.81);Other symptoms and signs involving the nervous system (R29.898)     Time: 6606-3016 PT Time Calculation (min) (ACUTE ONLY): 24 min  Charges:  $Gait Training: 8-22 mins $Therapeutic Exercise: 8-22 mins                     Blondell Reveal Kistler PT 07/29/2022  Acute Rehabilitation Services  Office [phone]

## 2022-07-29 NOTE — Telephone Encounter (Signed)
FYI:  Patient said they are not giving her Ingrezza in the hospital and she is not having TD, but it is on her hospital medication list. She also said they are trying to give her Ambien and she has never taken that, but it is also on her hospital med list.

## 2022-07-29 NOTE — Progress Notes (Signed)
Orthopedic Tech Progress Note Patient Details:  [name] [birth year] [id number]  Patient ID: [name], female   DOB: 02-22-1946, 76 y.o.   MRN: [id number] Spoke with PT and they requested I wait a few hours before placing the patient in the CPM, as they are currently sitting up in the recliner. Vernona Rieger 07/29/2022, 11:39 AM

## 2022-07-29 NOTE — Progress Notes (Signed)
     Subjective: Continues to make slow progress with PT. Ambulating 16 and 10 feet. Given assistance required patient and family are leaning towards SNF at this point; however, still can discharge home if makes good functional progress.  Objective:   VITALS:   Vitals:   07/28/22 0613 07/28/22 1408 07/28/22 1949 07/29/22 0600  BP: 117/61 120/62 136/84 121/68  Pulse: (!) 59 71  (!) 103  Resp: '18 16 18 18  '$ Temp: 97.7 F (36.5 C) 98.9 F (37.2 C) 98.7 F (37.1 C) 98.2 F (36.8 C)  TempSrc:   Oral Oral  SpO2: 100%  97% 94%  Weight:      Height:        Sensation intact distally Intact pulses distally Dorsiflexion/Plantar flexion intact Incision: dressing C/D/I Compartment soft   Lab Results  Component Value Date   WBC 11.3 (H) 07/25/2022   HGB 10.3 (L) 07/25/2022   HCT 31.9 (L) 07/25/2022   MCV 99.4 07/25/2022   PLT 179 07/25/2022   BMET    Component Value Date/Time   NA 136 07/23/2022 0313   NA 140 05/27/2017 1553   K 4.0 07/23/2022 0313   CL 102 07/23/2022 0313   CO2 28 07/23/2022 0313   GLUCOSE 142 (H) 07/23/2022 0313   BUN 16 07/23/2022 0313   BUN 15 05/27/2017 1553   CREATININE 0.78 07/23/2022 0313   CALCIUM 8.8 (L) 07/23/2022 0313   GFRNONAA >60 07/23/2022 0313    Xray: Left total knee arthroplasty components good position no adverse features  Assessment/Plan: 7 Days Post-Op   Principal Problem:   Localized osteoarthritis of left knee  Status post left total knee arthroplasty 07/22/2022  Post op recs: WB: WBAT Abx: continue ancef in house. Will discharge with 7 days cefadroxil 500BID given patient's hx of LE cellulitis. Imaging: PACU xrays DVT prophylaxis: Aspirin '81mg'$  BID x4 weeks Follow up: 2 weeks after surgery for a wound check with Dr. Zachery Dakins at [facility].  Address: [address] [place], [place], [place] [address]  Office Phone: [phone]      [name] 07/29/2022, 9:47 AM   Charlies Constable,  MD  Contact information:   [phone] 7am-5pm epic message Dr. Zachery Dakins, or call office for patient follow up: [phone] After hours and holidays please check [email or web address] for group call information for [facility]

## 2022-07-29 NOTE — Plan of Care (Signed)
  Problem: Education: Goal: Knowledge of the prescribed therapeutic regimen will improve Outcome: Progressing   Problem: Activity: Goal: Ability to avoid complications of mobility impairment will improve Outcome: Progressing   

## 2022-07-29 NOTE — Plan of Care (Signed)
  Problem: Education: Goal: Knowledge of the prescribed therapeutic regimen will improve 07/29/2022 0919 by Butler Denmark I, RN Outcome: Progressing 07/29/2022 0813 by Butler Denmark I, RN Outcome: Progressing   Problem: Activity: Goal: Ability to avoid complications of mobility impairment will improve Outcome: Progressing Goal: Range of joint motion will improve Outcome: Progressing   Problem: Education: Goal: Knowledge of General Education information will improve Description: Including pain rating scale, medication(s)/side effects and non-pharmacologic comfort measures Outcome: Progressing   Problem: Activity: Goal: Risk for activity intolerance will decrease Outcome: Progressing   Problem: Education: Goal: Knowledge of the prescribed therapeutic regimen will improve Outcome: Progressing   Problem: Activity: Goal: Ability to avoid complications of mobility impairment will improve Outcome: Progressing   Problem: Pain Management: Goal: Pain level will decrease with appropriate interventions Outcome: Progressing   Problem: Skin Integrity: Goal: Will show signs of wound healing Outcome: Progressing

## 2022-07-29 NOTE — Care Management Important Message (Signed)
Important Message  Patient Details IM Letter given to the Patient. Name: [name] MRN: [id number] Date of Birth: [birth year]   Medicare Important Message Given:  Yes     Kerin Salen 07/29/2022, 1:13 PM

## 2022-07-29 NOTE — Telephone Encounter (Signed)
Patient lvm at 10:46 stating that she has some questions regarding her medications. She "is still in the hospital." Ph: [phone]

## 2022-07-29 NOTE — Discharge Summary (Signed)
Physician Discharge Summary  Patient ID: [name] MRN: [id number] DOB/AGE: 1945/12/23 76 y.o.  Admit date: 07/22/2022 Discharge date: 07/29/2022  Admission Diagnoses:  Localized osteoarthritis of left knee  Discharge Diagnoses:  Principal Problem:   Localized osteoarthritis of left knee   Past Medical History:  Diagnosis Date   Anemia    Arthritis    Asthma    Depression    H/O head injury    Hearing loss    Heart murmur    can be heard at times, Doctor said not to worry   History of kidney stones    History of stomach ulcers    Migraine    Pneumonia    Stroke [facility])     Surgeries: Procedure(s): TOTAL KNEE ARTHROPLASTY on 07/22/2022   Consultants (if any):   Discharged Condition: Improved  Hospital Course: [name] is an 76 y.o. female who was admitted 07/22/2022 with a diagnosis of Localized osteoarthritis of left knee and went to the operating room on 07/22/2022 and underwent the above named procedures.  She had a challenging post op course given baseline deconditioning. Was unable to progress with physical therapy to a point for safe discharge home so elected for discharge to SNF.  She was given perioperative antibiotics:  Anti-infectives (From admission, onward)    Start     Dose/Rate Route Frequency Ordered Stop   07/29/22 1000  cefadroxil (DURICEF) capsule 500 mg        500 mg Oral 2 times daily 07/29/22 0621 08/05/22 0959   07/29/22 0000  cefadroxil (DURICEF) 500 MG capsule        500 mg Oral 2 times daily 07/29/22 1337 08/05/22 2359   07/24/22 2200  ceFAZolin (ANCEF) IVPB 2g/100 mL premix        2 g 200 mL/hr over 30 Minutes Intravenous Every 8 hours 07/24/22 1721 07/26/22 1545   07/22/22 1600  ceFAZolin (ANCEF) IVPB 2g/100 mL premix        2 g 200 mL/hr over 30 Minutes Intravenous Every 6 hours 07/22/22 1404 07/22/22 2155   07/22/22 0830  ceFAZolin (ANCEF) IVPB 2g/100 mL premix        2 g 200 mL/hr over 30 Minutes Intravenous On call to O.R.  07/22/22 0818 07/22/22 1029     .  She was given sequential compression devices, early ambulation, and aspirin for DVT prophylaxis.  She benefited maximally from the hospital stay and there were no complications.    Recent vital signs:  Vitals:   07/29/22 0600 07/29/22 1324  BP: 121/68 96/61  Pulse: (!) 103 (!) 113  Resp: 18   Temp: 98.2 F (36.8 C) 97.8 F (36.6 C)  SpO2: 94% 95%    Recent laboratory studies:  Lab Results  Component Value Date   HGB 10.3 (L) 07/25/2022   HGB 11.5 (L) 07/24/2022   HGB 11.3 (L) 07/23/2022   Lab Results  Component Value Date   WBC 11.3 (H) 07/25/2022   PLT 179 07/25/2022   No results found for: "INR" Lab Results  Component Value Date   NA 136 07/23/2022   K 4.0 07/23/2022   CL 102 07/23/2022   CO2 28 07/23/2022   BUN 16 07/23/2022   CREATININE 0.78 07/23/2022   GLUCOSE 142 (H) 07/23/2022    Discharge Medications:   Allergies as of 07/29/2022       Reactions   Ticlid [ticlopidine] Shortness Of Breath   Nsaids Other (See Comments)   Bleeding ulcer  Prednisone Other (See Comments)   Makes her stomach hurt--knows this isn't an allergy but doesn't want to take it   Monistat [miconazole] Swelling, Rash   Sulfa Antibiotics Rash        Medication List     STOP taking these medications    traMADol 50 MG tablet Commonly known as: ULTRAM       TAKE these medications    acetaminophen 500 MG tablet Commonly known as: TYLENOL Take 1,000 mg by mouth every 6 (six) hours as needed for headache, moderate pain or mild pain.   ARIPiprazole 5 MG tablet Commonly known as: ABILIFY Take 1 tablet (5 mg total) by mouth daily.   aspirin EC 81 MG tablet Take 1 tablet (81 mg total) by mouth 2 (two) times daily for 28 days. Swallow whole. Start taking on: July 30, 2022   busPIRone 30 MG tablet Commonly known as: BUSPAR Take 1 tablet (30 mg total) by mouth 2 (two) times daily.   cefadroxil 500 MG capsule Commonly known  as: DURICEF Take 1 capsule (500 mg total) by mouth 2 (two) times daily for 7 days.   WCBJSEGBT-51 EX Apply 1 Application topically daily.   DULoxetine 60 MG capsule Commonly known as: CYMBALTA Take 1 capsule (60 mg total) by mouth 2 (two) times daily. What changed:  how much to take when to take this   eszopiclone 2 MG Tabs tablet Commonly known as: LUNESTA Take immediately before bedtime Start taking on: August 05, 2022   LORazepam 0.5 MG tablet Commonly known as: Ativan Take 1/2-1 tab twice daily as needed What changed:  how much to take how to take this when to take this reasons to take this additional instructions   modafinil 200 MG tablet Commonly known as: PROVIGIL Take 1 tab po q am and 1/2 tab po qd prn   Myrbetriq 50 MG Tb24 tablet Generic drug: mirabegron ER Take 50 mg by mouth daily.   ondansetron 4 MG tablet Commonly known as: Zofran Take 1 tablet (4 mg total) by mouth every 8 (eight) hours as needed for up to 14 days for nausea or vomiting.   oxyCODONE 5 MG immediate release tablet Commonly known as: Roxicodone Take 1 tablet (5 mg total) by mouth every 4 (four) hours as needed for up to 7 days for severe pain or moderate pain.   SUMAtriptan 100 MG tablet Commonly known as: IMITREX Take 100 mg by mouth every 2 (two) hours as needed for migraine or headache. May repeat in 2 hours if headache persists or recurs.   valbenazine 80 MG capsule Commonly known as: Ingrezza Take 1 capsule (80 mg total) by mouth at bedtime.   Vitamin D 125 MCG (5000 UT) Caps Take 5,000 Units by mouth daily.        Diagnostic Studies: DG Knee Left Port  Result Date: 07/22/2022 CLINICAL DATA:  Postoperative total knee arthroplasty EXAM: PORTABLE LEFT KNEE - 1-2 VIEW COMPARISON:  None Available. FINDINGS: Status post left knee total arthroplasty with expected overlying postoperative change. No evidence of perihardware fracture or component malpositioning. IMPRESSION:  Status post left knee total arthroplasty with expected overlying postoperative change. No evidence of perihardware fracture or component malpositioning. Electronically Signed   By: Delanna Ahmadi M.D.   On: 07/22/2022 13:18    Disposition: Discharge disposition: 03-Skilled Nursing Facility       Discharge Instructions     CPM   Complete by: As directed    Continuous passive motion machine (CPM):  Use the CPM from 0 to 60 for 2 hours per day.      You may increase by 10 degrees per day.  You may break it up into 2 or 3 sessions per day.      Use CPM for 4 weeks or until you are told to stop.   Call MD / Call 911   Complete by: As directed    If you experience chest pain or shortness of breath, CALL 911 and be transported to the hospital emergency room.  If you develope a fever above 101 F, pus (white drainage) or increased drainage or redness at the wound, or calf pain, call your surgeon's office.   Constipation Prevention   Complete by: As directed    Drink plenty of fluids.  Prune juice may be helpful.  You may use a stool softener, such as Colace (over the counter) 100 mg twice a day.  Use MiraLax (over the counter) for constipation as needed.   Diet - low sodium heart healthy   Complete by: As directed    Do not put a pillow under the knee. Place it under the heel.   Complete by: As directed    Increase activity slowly as tolerated   Complete by: As directed    Post-operative opioid taper instructions:   Complete by: As directed    POST-OPERATIVE OPIOID TAPER INSTRUCTIONS: It is important to wean off of your opioid medication as soon as possible. If you do not need pain medication after your surgery it is ok to stop day one. Opioids include: Codeine, Hydrocodone(Norco, Vicodin), Oxycodone(Percocet, oxycontin) and hydromorphone amongst others.  Long term and even short term use of opiods can cause: Increased pain response Dependence Constipation Depression Respiratory  depression And more.  Withdrawal symptoms can include Flu like symptoms Nausea, vomiting And more Techniques to manage these symptoms Hydrate well Eat regular healthy meals Stay active Use relaxation techniques(deep breathing, meditating, yoga) Do Not substitute Alcohol to help with tapering If you have been on opioids for less than two weeks and do not have pain than it is ok to stop all together.  Plan to wean off of opioids This plan should start within one week post op of your joint replacement. Maintain the same interval or time between taking each dose and first decrease the dose.  Cut the total daily intake of opioids by one tablet each day Next start to increase the time between doses. The last dose that should be eliminated is the evening dose.           Follow-up Information     [name], MD. Go on 08/06/2022.   Specialty: Orthopedic Surgery Why: YOur appointment is scheduled fro 2:30. Contact information: [address] [place] [address] [phone]         [facility] Follow [facility].   Specialty: Home Health Services Why: Home health will provide 6 PT visits prior to you starting outpatient physical therapy Contact information: [place] [id number] [phone]         [facility]. Go on 08/07/2022.   Why: Your outpatient physical therapy appointment is scheduled. The facility should call you with a time. They have to speak with you to schedule Contact information: [place], [place]                   Discharge Instructions  INSTRUCTIONS AFTER JOINT REPLACEMENT   Remove items at home which could result in a fall. This includes throw rugs or furniture in walking pathways ICE to the affected joint every three hours while awake for 30 minutes at a time, for at least the first 3-5 days, and then as needed for pain and swelling.  Continue to use ice for  pain and swelling. You may notice swelling that will progress down to the foot and ankle.  This is normal after surgery.  Elevate your leg when you are not up walking on it.   Continue to use the breathing machine you got in the hospital (incentive spirometer) which will help keep your temperature down.  It is common for your temperature to cycle up and down following surgery, especially at night when you are not up moving around and exerting yourself.  The breathing machine keeps your lungs expanded and your temperature down.   DIET:  As you were doing prior to hospitalization, we recommend a well-balanced diet.  DRESSING / WOUND CARE / SHOWERING  Keep the surgical dressing until follow up.  The dressing is water proof, so you can shower without any extra covering.  IF THE DRESSING FALLS OFF or the wound gets wet inside, change the dressing with sterile gauze.  Please use good hand washing techniques before changing the dressing.  Do not use any lotions or creams on the incision until instructed by your surgeon.    ACTIVITY  Increase activity slowly as tolerated, but follow the weight bearing instructions below.   No driving for 6 weeks or until further direction given by your physician.  You cannot drive while taking narcotics.  No lifting or carrying greater than 10 lbs. until further directed by your surgeon. Avoid periods of inactivity such as sitting longer than an hour when not asleep. This helps prevent blood clots.  You may return to work once you are authorized by your doctor.     WEIGHT BEARING   Weight bearing as tolerated with assist device (walker, cane, etc) as directed, use it as long as suggested by your surgeon or therapist, typically at least 4-6 weeks.   EXERCISES  Results after joint replacement surgery are often greatly improved when you follow the exercise, range of motion and muscle strengthening exercises prescribed by your doctor. Safety measures are also  important to protect the joint from further injury. Any time any of these exercises cause you to have increased pain or swelling, decrease what you are doing until you are comfortable again and then slowly increase them. If you have problems or questions, call your caregiver or physical therapist for advice.   Rehabilitation is important following a joint replacement. After just a few days of immobilization, the muscles of the leg can become weakened and shrink (atrophy).  These exercises are designed to build up the tone and strength of the thigh and leg muscles and to improve motion. Often times heat used for twenty to thirty minutes before working out will loosen up your tissues and help with improving the range of motion but do not use heat for the first two weeks following surgery (sometimes heat can increase post-operative swelling).   These exercises can be done on a training (exercise) mat, on the floor, on a table or on a bed. Use whatever works the best and is most comfortable for you.    Use music or television while you are exercising so that the exercises are a pleasant break in  your day. This will make your life better with the exercises acting as a break in your routine that you can look forward to.   Perform all exercises about fifteen times, three times per day or as directed.  You should exercise both the operative leg and the other leg as well.  Exercises include:   Quad Sets - Tighten up the muscle on the front of the thigh (Quad) and hold for 5-10 seconds.   Straight Leg Raises - With your knee straight (if you were given a brace, keep it on), lift the leg to 60 degrees, hold for 3 seconds, and slowly lower the leg.  Perform this exercise against resistance later as your leg gets stronger.  Leg Slides: Lying on your back, slowly slide your foot toward your buttocks, bending your knee up off the floor (only go as far as is comfortable). Then slowly slide your foot back down until your  leg is flat on the floor again.  Angel Wings: Lying on your back spread your legs to the side as far apart as you can without causing discomfort.  Hamstring Strength:  Lying on your back, push your heel against the floor with your leg straight by tightening up the muscles of your buttocks.  Repeat, but this time bend your knee to a comfortable angle, and push your heel against the floor.  You may put a pillow under the heel to make it more comfortable if necessary.   A rehabilitation program following joint replacement surgery can speed recovery and prevent re-injury in the future due to weakened muscles. Contact your doctor or a physical therapist for more information on knee rehabilitation.    CONSTIPATION  Constipation is defined medically as fewer than three stools per week and severe constipation as less than one stool per week.  Even if you have a regular bowel pattern at home, your normal regimen is likely to be disrupted due to multiple reasons following surgery.  Combination of anesthesia, postoperative narcotics, change in appetite and fluid intake all can affect your bowels.   YOU MUST use at least one of the following options; they are listed in order of increasing strength to get the job done.  They are all available over the counter, and you may need to use some, POSSIBLY even all of these options:    Drink plenty of fluids (prune juice may be helpful) and high fiber foods Colace 100 mg by mouth twice a day  Senokot for constipation as directed and as needed Dulcolax (bisacodyl), take with full glass of water  Miralax (polyethylene glycol) once or twice a day as needed.  If you have tried all these things and are unable to have a bowel movement in the first 3-4 days after surgery call either your surgeon or your primary doctor.    If you experience loose stools or diarrhea, hold the medications until you stool forms back up.  If your symptoms do not get better within 1 week or if  they get worse, check with your doctor.  If you experience "the worst abdominal pain ever" or develop nausea or vomiting, please contact the office immediately for further recommendations for treatment.   ITCHING:  If you experience itching with your medications, try taking only a single pain pill, or even half a pain pill at a time.  You can also use Benadryl over the counter for itching or also to help with sleep.   TED HOSE STOCKINGS:  Use stockings on  both legs until for at least 2 weeks or as directed by physician office. They may be removed at night for sleeping.  MEDICATIONS:  See your medication summary on the "After Visit Summary" that nursing will review with you.  You may have some home medications which will be placed on hold until you complete the course of blood thinner medication.  It is important for you to complete the blood thinner medication as prescribed.   Blood clot prevention (DVT Prophylaxis): After surgery you are at an increased risk for a blood clot. you were prescribed a blood thinner, Aspirin '81mg'$ , to be taken twice daily for a total of 4 weeks from surgery to help reduce your risk of getting a blood clot. This will help prevent a blood clot. Signs of a pulmonary embolus (blood clot in the lungs) include sudden short of breath, feeling lightheaded or dizzy, chest pain with a deep breath, rapid pulse rapid breathing. Signs of a blood clot in your arms or legs include new unexplained swelling and cramping, warm, red or darkened skin around the painful area. Please call the office or 911 right away if these signs or symptoms develop.  PRECAUTIONS:  If you experience chest pain or shortness of breath - call 911 immediately for transfer to the hospital emergency department.   If you develop a fever greater that 101 F, purulent drainage from wound, increased redness or drainage from wound, foul odor from the wound/dressing, or calf pain - CONTACT YOUR SURGEON.                                                    FOLLOW-UP APPOINTMENTS:  If you do not already have a post-op appointment, please call the office for an appointment to be seen by your surgeon.  Guidelines for how soon to be seen are listed in your "After Visit Summary", but are typically between 2-3 weeks after surgery.  OTHER INSTRUCTIONS:   Knee Replacement:  Do not place pillow under knee, focus on keeping the knee straight while resting. CPM instructions: 0-90 degrees, 2 hours in the morning, 2 hours in the afternoon, and 2 hours in the evening. Place foam block, curve side up under heel at all times except when in CPM or when walking.  DO NOT modify, tear, cut, or change the foam block in any way.  POST-OPERATIVE OPIOID TAPER INSTRUCTIONS: It is important to wean off of your opioid medication as soon as possible. If you do not need pain medication after your surgery it is ok to stop day one. Opioids include: Codeine, Hydrocodone(Norco, Vicodin), Oxycodone(Percocet, oxycontin) and hydromorphone amongst others.  Long term and even short term use of opiods can cause: Increased pain response Dependence Constipation Depression Respiratory depression And more.  Withdrawal symptoms can include Flu like symptoms Nausea, vomiting And more Techniques to manage these symptoms Hydrate well Eat regular healthy meals Stay active Use relaxation techniques(deep breathing, meditating, yoga) Do Not substitute Alcohol to help with tapering If you have been on opioids for less than two weeks and do not have pain than it is ok to stop all together.  Plan to wean off of opioids This plan should start within one week post op of your joint replacement. Maintain the same interval or time between taking each dose and first decrease the dose.  Cut the  total daily intake of opioids by one tablet each day Next start to increase the time between doses. The last dose that should be eliminated is the evening dose.   MAKE  SURE YOU:  Understand these instructions.  Get help right away if you are not doing well or get worse.    Thank you for letting us be a part of your medical care team.  It is a privilege we respect greatly.  We hope these instructions will help you stay on track for a fast and full recovery!            Signed: Shloimy Michalski A Alaiza Yau 07/29/2022, 1:37 PM

## 2022-07-29 NOTE — Progress Notes (Signed)
Physical Therapy Treatment Patient Details Name: [name] MRN: [id number] DOB: 1946-05-24 Today's Date: 07/29/2022   History of Present Illness Pt is a 76yo female presenting s/p L-TKA on 07/22/22. PMH: stroke, R-RCR, HLD, ankle fx/surgery, cognitive change    PT Comments    Pt reported she needed to use the bathroom. Assisted pt from recliner to bedside commode, then to bed. She continues to require assist of 2 for sit to stand, and has a significant posterior lean in standing which requires Mod assist to correct. Assisted pt with L knee AAROM exercises, then assisted her with getting dressed.  Noted plan to DC to SNF today.    Recommendations for follow up therapy are one component of a multi-disciplinary discharge planning process, led by the attending physician.  Recommendations may be updated based on patient status, additional functional criteria and insurance authorization.  Follow Up Recommendations  Skilled nursing-short term rehab (<3 hours/day) Can patient physically be transported by private vehicle: No   Assistance Recommended at Discharge Frequent or constant Supervision/Assistance  Patient can return home with the following Assistance with cooking/housework;Assist for transportation;Help with stairs or ramp for entrance;Direct supervision/assist for financial management;Direct supervision/assist for medications management;Two people to help with bathing/dressing/bathroom;Two people to help with walking and/or transfers   Equipment Recommendations  None recommended by PT    Recommendations for Other Services       Precautions / Restrictions Precautions Precautions: Fall;Knee Precaution Booklet Issued: Yes (comment) Precaution Comments: reviewed no pillow under knee Restrictions Weight Bearing Restrictions: No Other Position/Activity Restrictions: WBAT     Mobility  Bed Mobility   Bed Mobility: Sit to Supine       Sit to supine: Mod assist   General bed  mobility comments: assist for BLEs into bed    Transfers Overall transfer level: Needs assistance Equipment used: Rolling walker (2 wheels) Transfers: Sit to/from Stand Sit to Stand: Mod assist, +2 physical assistance, +2 safety/equipment   Step pivot transfers: Min assist, +2 physical assistance, +2 safety/equipment       General transfer comment: MOD A +2 for power up to stand with repeated cues for hand placement- pt attempting to use B UEs on RW to pull up. Cues for optimal LE placement prior to standing, pt able to move actively with increased time. Pt with posterior lean upon standing assist required at pelvis for promtion of upright posture/hip extension for full stand. SPT recliner to 3 in 1, then to bed with RW    Ambulation/Gait Ambulation/Gait assistance: Min assist, +2 physical assistance, +2 safety/equipment Gait Distance (Feet): 4 Feet Assistive device: Rolling walker (2 wheels) Gait Pattern/deviations: Step-to pattern, Decreased step length - right, Decreased step length - left, Decreased weight shift to left Gait velocity: decreased     General Gait Details: VCs for posture and to increase step length, distance limited by pain/fatigue   Stairs             Wheelchair Mobility    Modified Rankin (Stroke Patients Only)       Balance Overall balance assessment: Needs assistance Sitting-balance support: Single extremity supported, Feet supported Sitting balance-Leahy Scale: Fair   Postural control: Posterior lean Standing balance support: Reliant on assistive device for balance, During functional activity, Bilateral upper extremity supported Standing balance-Leahy Scale: Poor Standing balance comment: reliant on UEs and external support, mod A for correction of posterior lean  Cognition Arousal/Alertness: Awake/alert Behavior During Therapy: Flat affect Overall Cognitive Status: Impaired/Different from baseline                          Following Commands: Follows one step commands with increased time     Problem Solving: Slow processing, Decreased initiation, Requires tactile cues, Requires verbal cues General Comments: increased time/repetition. pt HOH- hears better out of R ear        Exercises Total Joint Exercises Ankle Circles/Pumps: AROM, Both, Seated, 20 reps Quad Sets: AROM, Left, Limitations, 10 reps, Supine Quad Sets Limitations: repeated verbal/tactile cues for proper technique, very weak quad contraction Short Arc Quad: AAROM, Left, 10 reps, Supine Heel Slides: AAROM, Left, 15 reps, Supine Hip ABduction/ADduction: AAROM, Left, 10 reps, Supine Long Arc Quad: AAROM, Left, Seated, 15 reps Knee Flexion: AAROM, Left, Seated, 20 reps Goniometric ROM: 5-45* AAROM L knee    General Comments        Pertinent Vitals/Pain Pain Assessment Pain Score: 6  Pain Location: L knee with movement Pain Descriptors / Indicators: Sore, Guarding, Discomfort Pain Intervention(s): Limited activity within patient's tolerance, Monitored during session, Repositioned    Home Living                          Prior Function            PT Goals (current goals can now be found in the care plan section) Acute Rehab PT Goals Patient Stated Goal: none stated PT Goal Formulation: With patient/family Time For Goal Achievement: 07/30/22 Potential to Achieve Goals: Good Progress towards PT goals: Progressing toward goals    Frequency    7X/week      PT Plan Current plan remains appropriate    Co-evaluation              AM-PAC PT "6 Clicks" Mobility   Outcome Measure  Help needed turning from your back to your side while in a flat bed without using bedrails?: A Lot Help needed moving from lying on your back to sitting on the side of a flat bed without using bedrails?: A Lot Help needed moving to and from a bed to a chair (including a wheelchair)?: A Lot Help needed  standing up from a chair using your arms (e.g., wheelchair or bedside chair)?: A Lot Help needed to walk in hospital room?: A Little Help needed climbing 3-5 steps with a railing? : Total 6 Click Score: 12    End of Session Equipment Utilized During Treatment: Gait belt Activity Tolerance: Patient tolerated treatment well Patient left: with call bell/phone within reach;in bed;with bed alarm set;with family/visitor present Nurse Communication: Mobility status PT Visit Diagnosis: Other abnormalities of gait and mobility (R26.89);Muscle weakness (generalized) (M62.81);Other symptoms and signs involving the nervous system (R29.898)     Time: 5465-0354 PT Time Calculation (min) (ACUTE ONLY): 32 min  Charges:  $Gait Training: 8-22 mins $Therapeutic Exercise: 8-22 mins                    Blondell Reveal Kistler PT 07/29/2022  Acute Rehabilitation Services  Office [phone]

## 2022-07-29 NOTE — Progress Notes (Signed)
Attempted to call [facility] to give report no answer. D FranklinRN

## 2022-07-30 ENCOUNTER — Telehealth: Payer: Self-pay

## 2022-07-30 ENCOUNTER — Other Ambulatory Visit: Payer: Self-pay | Admitting: Adult Health

## 2022-07-30 DIAGNOSIS — F411 Generalized anxiety disorder: Secondary | ICD-10-CM

## 2022-07-30 DIAGNOSIS — F5101 Primary insomnia: Secondary | ICD-10-CM

## 2022-07-30 DIAGNOSIS — F482 Pseudobulbar affect: Secondary | ICD-10-CM

## 2022-07-30 DIAGNOSIS — F332 Major depressive disorder, recurrent severe without psychotic features: Secondary | ICD-10-CM

## 2022-07-30 MED ORDER — ESZOPICLONE 2 MG PO TABS: ORAL_TABLET | ORAL | 2 refills | Status: DC

## 2022-07-30 MED ORDER — MODAFINIL 200 MG PO TABS: ORAL_TABLET | ORAL | 4 refills | Status: DC

## 2022-07-30 MED ORDER — LORAZEPAM 0.5 MG PO TABS: 0.5000 mg | ORAL_TABLET | Freq: Two times a day (BID) | ORAL | 0 refills | Status: DC | PRN

## 2022-07-30 NOTE — Telephone Encounter (Signed)
Her chart indicates that she had a knee replacement and was discharged from the hospital with plan to send her to a SNF for additional rehabilitation. Records can be provided to her SNF providers if needed for coordination of care.

## 2022-07-31 NOTE — Telephone Encounter (Signed)
Please find out what they are needing or if there is a specific question they have. Typically when someone is in a SNF, SNF providers are the only providers that write orders so it would most likely be up to the SNF providers if they are going to order Ingrezza and at what dose.

## 2022-07-31 NOTE — Telephone Encounter (Addendum)
Called and spoke with Carmen Anna, RN. She said provider in facility was concerned about a retropulsion from weaning of the [place]. We had just sent an Rx for an increased dose and I don't see mention of a wean in your last note. I know that the SNF provider takes lead at this point. Husband had told them we were weaning is why SNF called. RN said patient has had a significant decline in cognitive status while there.

## 2022-08-01 ENCOUNTER — Non-Acute Institutional Stay (SKILLED_NURSING_FACILITY): Payer: PPO | Admitting: Adult Health

## 2022-08-01 DIAGNOSIS — K5909 Other constipation: Secondary | ICD-10-CM

## 2022-08-01 DIAGNOSIS — G2401 Drug induced subacute dyskinesia: Secondary | ICD-10-CM | POA: Diagnosis not present

## 2022-08-01 DIAGNOSIS — F5101 Primary insomnia: Secondary | ICD-10-CM | POA: Diagnosis not present

## 2022-08-01 DIAGNOSIS — N3281 Overactive bladder: Secondary | ICD-10-CM | POA: Diagnosis not present

## 2022-08-01 DIAGNOSIS — F332 Major depressive disorder, recurrent severe without psychotic features: Secondary | ICD-10-CM | POA: Diagnosis not present

## 2022-08-01 DIAGNOSIS — F418 Other specified anxiety disorders: Secondary | ICD-10-CM | POA: Diagnosis not present

## 2022-08-01 DIAGNOSIS — F411 Generalized anxiety disorder: Secondary | ICD-10-CM | POA: Diagnosis not present

## 2022-08-01 DIAGNOSIS — M1712 Unilateral primary osteoarthritis, left knee: Secondary | ICD-10-CM

## 2022-08-01 NOTE — Progress Notes (Signed)
Location:  [facility] Room Number: [place] of Service:  SNF (31) Provider:  Durenda Age, DNP, FNP-BC  Patient Care Team: Asencion Noble, MD as PCP - General (Internal Medicine) Jodi Marble, MD as Consulting Physician (Otolaryngology)  Extended Emergency Contact Information Primary Emergency Contact: Mies,Danny Address: [address]          [place], [place] [address] Johnnette Litter of [place] Phone: [phone] Mobile Phone: [phone] Relation: Spouse  Code Status:   Full Code  Goals of care: Advanced Directive information    07/22/2022    8:38 AM  Advanced Directives  Does Patient Have a Medical Advance Directive? Yes  Type of Paramedic of [place];Living will  Does patient want to make changes to medical advance directive? No - Guardian declined  Copy of [place] in Chart? No - copy requested  Would patient like information on creating a medical advance directive? No - Patient declined     Chief Complaint  Patient presents with   Acute Visit    Follow up hospitalization    HPI:  Pt is a 76 y.o. female seen today for follow up hospitalization. She has a PMH of asthma, depression,history of head injury, hearing loss and history of stomach ulcers and migraine. She was admitted to [facility] on 07/29/22 post hospital admission 07/22/22 to 07/29/22 for osteoarthritis of left knee S/P left total knee arthroplasty on 07/22/22. She was admitted to [facility] for short-term rehabilitation.  She was seen in the room today. She has aquacel dressing, dry and no erythema on left knee, has LLE 2+edema. She complained of constipation X 4 days.   Past Medical History:  Diagnosis Date   Anemia    Arthritis    Asthma    Depression    H/O head injury    Hearing loss    Heart murmur    can be heard at times, Doctor said not to worry   History of kidney stones    History of stomach ulcers     Migraine    Pneumonia    Stroke [facility])    Past Surgical History:  Procedure Laterality Date   ANKLE FRACTURE SURGERY Left    APPENDECTOMY     BREAST LUMPECTOMY     CESAREAN SECTION     x 3   PARTIAL HYSTERECTOMY     ROTATOR CUFF REPAIR Right    TOTAL KNEE ARTHROPLASTY Left 07/22/2022   Procedure: TOTAL KNEE ARTHROPLASTY;  Surgeon: [name], MD;  Location: [facility] ORS;  Service: Orthopedics;  Laterality: Left;    Allergies  Allergen Reactions   Ticlid [Ticlopidine] Shortness Of Breath   Nsaids Other (See Comments)    Bleeding ulcer   Prednisone Other (See Comments)    Makes her stomach hurt--knows this isn't an allergy but doesn't want to take it    Monistat [Miconazole] Swelling and Rash   Sulfa Antibiotics Rash    Outpatient Encounter Medications as of 08/01/2022  Medication Sig   acetaminophen (TYLENOL) 500 MG tablet Take 1,000 mg by mouth every 6 (six) hours as needed for headache, moderate pain or mild pain.   ARIPiprazole (ABILIFY) 5 MG tablet Take 1 tablet (5 mg total) by mouth daily.   aspirin EC 81 MG tablet Take 1 tablet (81 mg total) by mouth 2 (two) times daily for 28 days. Swallow whole.   busPIRone (BUSPAR) 30 MG tablet Take 1 tablet (30 mg total) by  mouth 2 (two) times daily.   [EXPIRED] cefadroxil (DURICEF) 500 MG capsule Take 1 capsule (500 mg total) by mouth 2 (two) times daily for 7 days.   Cholecalciferol (VITAMIN D) 125 MCG (5000 UT) CAPS Take 5,000 Units by mouth daily.   DULoxetine (CYMBALTA) 60 MG capsule Take 1 capsule (60 mg total) by mouth 2 (two) times daily. (Patient taking differently: Take 120 mg by mouth at bedtime.)   eszopiclone (LUNESTA) 2 MG TABS tablet Take immediately before bedtime   Hydrocortisone (CVELFYBOF-75 EX) Apply 1 Application topically daily.   LORazepam (ATIVAN) 0.5 MG tablet Take 1 tablet (0.5 mg total) by mouth 2 (two) times daily as needed for anxiety.   modafinil (PROVIGIL) 200 MG tablet Take 1 tab po q am and 1/2 tab  po qd prn   MYRBETRIQ 50 MG TB24 tablet Take 50 mg by mouth daily.   ondansetron (ZOFRAN) 4 MG tablet Take 1 tablet (4 mg total) by mouth every 8 (eight) hours as needed for up to 14 days for nausea or vomiting.   [EXPIRED] oxyCODONE (ROXICODONE) 5 MG immediate release tablet Take 1 tablet (5 mg total) by mouth every 4 (four) hours as needed for up to 7 days for severe pain or moderate pain.   SUMAtriptan (IMITREX) 100 MG tablet Take 100 mg by mouth every 2 (two) hours as needed for migraine or headache. May repeat in 2 hours if headache persists or recurs.   valbenazine (INGREZZA) 80 MG capsule Take 1 capsule (80 mg total) by mouth at bedtime.   No facility-administered encounter medications on file as of 08/01/2022.    Review of Systems  Constitutional:  Negative for appetite change, chills, fatigue and fever.  HENT:  Negative for congestion, hearing loss, rhinorrhea and sore throat.   Eyes: Negative.   Respiratory:  Negative for cough, shortness of breath and wheezing.   Cardiovascular:  Negative for chest pain, palpitations and leg swelling.  Gastrointestinal:  Positive for constipation. Negative for abdominal pain, diarrhea, nausea and vomiting.  Genitourinary:  Negative for dysuria.  Musculoskeletal:  Negative for arthralgias, back pain and myalgias.  Skin:  Negative for color change, rash and wound.  Neurological:  Negative for dizziness, weakness and headaches.  Psychiatric/Behavioral:  Negative for behavioral problems. The patient is not nervous/anxious.       Immunization History  Administered Date(s) Administered   Influenza, High Dose Seasonal PF 08/19/2017   PFIZER(Purple Top)SARS-COV-2 Vaccination 12/06/2019, 12/27/2019, 08/17/2020   Pneumococcal Conjugate-13 08/19/2017   Tdap 01/05/2014   Zoster Recombinat (Shingrix) 07/23/2019, 09/24/2019   Pertinent  Health Maintenance Due  Topic Date Due   COLONOSCOPY (Pts 45-34yr Insurance coverage will need to be confirmed)   Never done   DEXA SCAN  Never done   INFLUENZA VACCINE  06/11/2022   MAMMOGRAM  04/01/2024      07/27/2022    7:55 AM 07/27/2022    9:20 PM 07/28/2022    7:30 AM 07/28/2022    7:32 PM 07/29/2022    8:00 AM  Fall Risk  Patient Fall Risk Level High fall risk High fall risk High fall risk High fall risk High fall risk     Vitals:   08/01/22 1609  BP: 122/70  Pulse: 72  Resp: 20  SpO2: 95%  Weight: 206 lb 14.4 oz (93.8 kg)  Height: '5\' 5"'$  (1.651 m)   Body mass index is 34.43 kg/m.  Physical Exam Constitutional:      Appearance: She is obese.  HENT:  Head: Normocephalic and atraumatic.     Nose: Nose normal.     Mouth/Throat:     Mouth: Mucous membranes are moist.  Eyes:     Conjunctiva/sclera: Conjunctivae normal.  Cardiovascular:     Rate and Rhythm: Normal rate and regular rhythm.  Pulmonary:     Effort: Pulmonary effort is normal.     Breath sounds: Normal breath sounds.  Abdominal:     General: Bowel sounds are normal.     Palpations: Abdomen is soft.  Musculoskeletal:        General: Normal range of motion.     Cervical back: Normal range of motion.     Left lower leg: Edema present.     Comments: LLE 2+edema  Skin:    General: Skin is warm and dry.     Comments: Left knee surgical wound with aquacel dressing  Neurological:     General: No focal deficit present.     Mental Status: She is alert and oriented to person, place, and time.  Psychiatric:        Mood and Affect: Mood normal.        Behavior: Behavior normal.        Thought Content: Thought content normal.        Judgment: Judgment normal.       Labs reviewed: Recent Labs    07/18/22 1459 07/23/22 0313 08/04/22 0600  NA 141 136 135  K 4.3 4.0 4.0  CL 106 102 99  CO2 '30 28 28  '$ GLUCOSE 98 142* 93  BUN '19 16 15  '$ CREATININE 0.69 0.78 0.66  CALCIUM 9.4 8.8* 8.8*  MG  --   --  1.9   Recent Labs    07/18/22 1459  AST 24  ALT 15  ALKPHOS 50  BILITOT 0.5  PROT 7.0  ALBUMIN 4.1    Recent Labs    07/18/22 1459 07/23/22 0313 07/24/22 0321 07/25/22 0345 08/04/22 0600  WBC 6.2   < > 14.5* 11.3* 6.9  NEUTROABS 4.0  --   --   --   --   HGB 12.7   < > 11.5* 10.3* 10.1*  HCT 41.0   < > 35.3* 31.9* 31.8*  MCV 100.2*   < > 96.7 99.4 97.5  PLT 214   < > 183 179 371   < > = values in this interval not displayed.   Lab Results  Component Value Date   TSH 1.490 08/04/2022   No results found for: "HGBA1C" No results found for: "CHOL", "HDL", "LDLCALC", "LDLDIRECT", "TRIG", "CHOLHDL"  Significant Diagnostic Results in last 30 days:  DG Knee Left Port  Result Date: 07/22/2022 CLINICAL DATA:  Postoperative total knee arthroplasty EXAM: PORTABLE LEFT KNEE - 1-2 VIEW COMPARISON:  None Available. FINDINGS: Status post left knee total arthroplasty with expected overlying postoperative change. No evidence of perihardware fracture or component malpositioning. IMPRESSION: Status post left knee total arthroplasty with expected overlying postoperative change. No evidence of perihardware fracture or component malpositioning. Electronically Signed   By: Delanna Ahmadi M.D.   On: 07/22/2022 13:18    Assessment/Plan  1. Localized osteoarthritis of left knee -  S/P left total knee arthroplasty on 07/22/22 -   continue ASA for DVT prophylaxis, Cefadroxil for cellulitis prophylaxis -  WBAT -   follow up with orthopedics -  for PT and OT, for therapeutic strengthening exercises  2. Chronic constipation -  will start on MagCitrate 8 oz liquid PO Q D PRN  3. Depression with anxiety -  continue Abilify, Cymbalta and Buspar and Ativan PRN  4. Primary insomnia -  continue Lunesta  5. OAB (overactive bladder) -  continue Myrbetriq    Family/ staff Communication: Discussed plan of care with resident and charge nurse.  Labs/tests ordered:   None    Durenda Age, DNP, MSN, FNP-BC [facility] [phone] (Monday-Friday 8:00 a.m. - 5:00  p.m.) [phone] (after hours)

## 2022-08-02 ENCOUNTER — Non-Acute Institutional Stay (SKILLED_NURSING_FACILITY): Payer: PPO | Admitting: Internal Medicine

## 2022-08-02 ENCOUNTER — Encounter: Payer: Self-pay | Admitting: Internal Medicine

## 2022-08-02 DIAGNOSIS — Z86718 Personal history of other venous thrombosis and embolism: Secondary | ICD-10-CM | POA: Insufficient documentation

## 2022-08-02 DIAGNOSIS — M1712 Unilateral primary osteoarthritis, left knee: Secondary | ICD-10-CM | POA: Diagnosis not present

## 2022-08-02 DIAGNOSIS — R000 Tachycardia, unspecified: Secondary | ICD-10-CM | POA: Insufficient documentation

## 2022-08-02 DIAGNOSIS — I4891 Unspecified atrial fibrillation: Secondary | ICD-10-CM | POA: Diagnosis not present

## 2022-08-02 DIAGNOSIS — I480 Paroxysmal atrial fibrillation: Secondary | ICD-10-CM | POA: Insufficient documentation

## 2022-08-02 NOTE — Assessment & Plan Note (Signed)
She denies this but tachyarrhythmia &  significant edema present clinically as is PMH of CVA. Eliquis 5 mg twice daily with discontinuation of the aspirin DVT prophylaxis because of A-fib with RVR.Marland Kitchen

## 2022-08-02 NOTE — Assessment & Plan Note (Addendum)
07/21/22 sinus arrhythmia on EKG 08/02/22 clinically AF with RVR suggested. DTR's 1.5+. PMH of LLE DVT ( she denies this) Repeat EKG performed stat, revealing A-fib with RVR with heart rates up to 130.  She is clinically asymptomatic.  Low-dose aspirin prophylaxis discontinued and Eliquis 5 mg twice daily initiated.  Metoprolol 25 mg twice daily for rate control. Check  Lytes , Mg+ & TFTs

## 2022-08-02 NOTE — Patient Instructions (Signed)
See assessment and plan under each diagnosis in the problem list and acutely for this visit 

## 2022-08-02 NOTE — Progress Notes (Unsigned)
   NURSING HOME LOCATION:  [facility] ROOM NUMBER:  [id number] P CODE STATUS:  Full Code  PCP:  Asencion Noble MD  This is a comprehensive admission note to this SNFperformed on this date less than 30 days from date of admission. Included are preadmission medical/surgical history; reconciled medication list; family history; social history and comprehensive review of systems.  Corrections and additions to the records were documented. Comprehensive physical exam was also performed. Additionally a clinical summary was entered for each active diagnosis pertinent to this admission in the Problem List to enhance continuity of care.  HPI:  Past medical and surgical history:  Social history:  Family history:   Review of systems: Clinical neurocognitive deficits made validity of responses questionable & preventing ROS completion. Date given as Constitutional: No fever, significant weight change, fatigue  Eyes: No redness, discharge, pain, vision change ENT/mouth: No nasal congestion, purulent discharge, earache, change in hearing, sore throat  Cardiovascular: No chest pain, palpitations, paroxysmal nocturnal dyspnea, claudication, edema  Respiratory: No cough, sputum production, hemoptysis, DOE, significant snoring, apnea Gastrointestinal: No heartburn, dysphagia, abdominal pain, nausea /vomiting, rectal bleeding, melena, change in bowels Genitourinary: No dysuria, hematuria, pyuria, incontinence, nocturia Musculoskeletal: No joint stiffness, joint swelling, weakness, pain Dermatologic: No rash, pruritus, change in appearance of skin Neurologic: No dizziness, headache, syncope, seizures, numbness, tingling Psychiatric: No significant anxiety, depression, insomnia, anorexia Endocrine: No change in hair/skin/nails, excessive thirst, excessive hunger, excessive urination  Hematologic/lymphatic: No significant bruising, lymphadenopathy, abnormal bleeding Allergy/immunology: No itchy/watery  eyes, significant sneezing, urticaria, angioedema  Physical exam:  Pertinent or positive findings: General appearance: Adequately nourished; no acute distress, increased work of breathing is present.   Lymphatic: No lymphadenopathy about the head, neck, axilla. Eyes: No conjunctival inflammation or lid edema is present. There is no scleral icterus. Ears:  External ear exam shows no significant lesions or deformities.   Nose:  External nasal examination shows no deformity or inflammation. Nasal mucosa are pink and moist without lesions, exudates Oral exam: Lips and gums are healthy appearing.There is no oropharyngeal erythema or exudate. Neck:  No thyromegaly, masses, tenderness noted.    Heart:  Normal rate and regular rhythm. S1 and S2 normal without gallop, murmur, click, rub.  Lungs: Chest clear to auscultation without wheezes, rhonchi, rales, rubs. Abdomen: Bowel sounds are normal.  Abdomen is soft and nontender with no organomegaly, hernias, masses. GU: Deferred  Extremities:  No cyanosis, clubbing, edema. Neurologic exam:  Strength equal  in upper & lower extremities. Balance, Rhomberg, finger to nose testing could not be completed due to clinical state Deep tendon reflexes are equal Skin: Warm & dry w/o tenting. No significant lesions or rash.  See clinical summary under each active problem in the Problem List with associated updated therapeutic plan

## 2022-08-02 NOTE — Assessment & Plan Note (Signed)
PT/OT at SNF as tolerated. ?

## 2022-08-04 ENCOUNTER — Encounter (HOSPITAL_COMMUNITY)
Admission: RE | Admit: 2022-08-04 | Discharge: 2022-08-04 | Disposition: A | Discharge status: Home / Self Care | Payer: PPO | Source: Skilled Nursing Facility | Attending: Internal Medicine | Admitting: Internal Medicine

## 2022-08-04 DIAGNOSIS — D649 Anemia, unspecified: Secondary | ICD-10-CM | POA: Insufficient documentation

## 2022-08-04 LAB — CBC
HCT: 31.8 % — ABNORMAL LOW (ref 36.0–46.0)
Hemoglobin: 10.1 g/dL — ABNORMAL LOW (ref 12.0–15.0)
MCH: 31 pg (ref 26.0–34.0)
MCHC: 31.8 g/dL (ref 30.0–36.0)
MCV: 97.5 fL (ref 80.0–100.0)
Platelets: 371 10*3/uL (ref 150–400)
RBC: 3.26 MIL/uL — ABNORMAL LOW (ref 3.87–5.11)
RDW: 13.9 % (ref 11.5–15.5)
WBC: 6.9 10*3/uL (ref 4.0–10.5)
nRBC: 0 % (ref 0.0–0.2)

## 2022-08-04 LAB — BASIC METABOLIC PANEL
Anion gap: 8 (ref 5–15)
BUN: 15 mg/dL (ref 8–23)
CO2: 28 mmol/L (ref 22–32)
Calcium: 8.8 mg/dL — ABNORMAL LOW (ref 8.9–10.3)
Chloride: 99 mmol/L (ref 98–111)
Creatinine, Ser: 0.66 mg/dL (ref 0.44–1.00)
GFR, Estimated: >60 mL/min (ref >60)
Glucose, Bld: 93 mg/dL (ref 70–99)
Potassium: 4 mmol/L (ref 3.5–5.1)
Sodium: 135 mmol/L (ref 135–145)

## 2022-08-04 LAB — TSH: TSH: 1.49 u[IU]/mL (ref 0.350–4.500)

## 2022-08-04 LAB — MAGNESIUM: Magnesium: 1.9 mg/dL (ref 1.7–2.4)

## 2022-08-04 LAB — T4, FREE: Free T4: 1.02 ng/dL (ref 0.61–1.12)

## 2022-08-05 ENCOUNTER — Encounter: Payer: Self-pay | Admitting: Internal Medicine

## 2022-08-05 ENCOUNTER — Other Ambulatory Visit (HOSPITAL_COMMUNITY)
Admission: RE | Admit: 2022-08-05 | Discharge: 2022-08-05 | Disposition: A | Discharge status: Home / Self Care | Payer: PPO | Source: Skilled Nursing Facility | Attending: Internal Medicine | Admitting: Internal Medicine

## 2022-08-05 ENCOUNTER — Non-Acute Institutional Stay (SKILLED_NURSING_FACILITY): Payer: PPO | Admitting: Internal Medicine

## 2022-08-05 DIAGNOSIS — U071 COVID-19: Secondary | ICD-10-CM | POA: Insufficient documentation

## 2022-08-05 DIAGNOSIS — I4891 Unspecified atrial fibrillation: Secondary | ICD-10-CM

## 2022-08-05 HISTORY — DX: COVID-19: U07.1

## 2022-08-05 LAB — C-REACTIVE PROTEIN: CRP: 3.5 mg/dL — ABNORMAL HIGH (ref <1.0)

## 2022-08-05 NOTE — Progress Notes (Unsigned)
   NURSING HOME LOCATION:  [facility] ROOM NUMBER: [id number] P  CODE STATUS:  Full Code  PCP: Asencion Noble MD  This is a nursing facility follow up visit for specific acute issue of + SARS screen.  Interim medical record and care since last SNF visit was updated with review of diagnostic studies and change in clinical status since last visit were documented.  HPI:  Review of systems: Dementia invalidated responses. Date given as   Constitutional: No fever, significant weight change, fatigue  Eyes: No redness, discharge, pain, vision change ENT/mouth: No nasal congestion,  purulent discharge, earache, change in hearing, sore throat  Cardiovascular: No chest pain, palpitations, paroxysmal nocturnal dyspnea, claudication, edema  Respiratory: No cough, sputum production, hemoptysis, DOE, significant snoring, apnea   Gastrointestinal: No heartburn, dysphagia, abdominal pain, nausea /vomiting, rectal bleeding, melena, change in bowels Genitourinary: No dysuria, hematuria, pyuria, incontinence, nocturia Musculoskeletal: No joint stiffness, joint swelling, weakness, pain Dermatologic: No rash, pruritus, change in appearance of skin Neurologic: No dizziness, headache, syncope, seizures, numbness, tingling Psychiatric: No significant anxiety, depression, insomnia, anorexia Endocrine: No change in hair/skin/nails, excessive thirst, excessive hunger, excessive urination  Hematologic/lymphatic: No significant bruising, lymphadenopathy, abnormal bleeding Allergy/immunology: No itchy/watery eyes, significant sneezing, urticaria, angioedema  Physical exam:  Pertinent or positive findings: General appearance: Adequately nourished; no acute distress, increased work of breathing is present.   Lymphatic: No lymphadenopathy about the head, neck, axilla. Eyes: No conjunctival inflammation or lid edema is present. There is no scleral icterus. Ears:  External ear exam shows no significant  lesions or deformities.   Nose:  External nasal examination shows no deformity or inflammation. Nasal mucosa are pink and moist without lesions, exudates Oral exam:  Lips and gums are healthy appearing. There is no oropharyngeal erythema or exudate. Neck:  No thyromegaly, masses, tenderness noted.    Heart:  Normal rate and regular rhythm. S1 and S2 normal without gallop, murmur, click, rub .  Lungs: Chest clear to auscultation without wheezes, rhonchi, rales, rubs. Abdomen: Bowel sounds are normal. Abdomen is soft and nontender with no organomegaly, hernias, masses. GU: Deferred  Extremities:  No cyanosis, clubbing, edema  Neurologic exam : Cn 2-7 intact Strength equal  in upper & lower extremities Balance, Rhomberg, finger to nose testing could not be completed due to clinical state Deep tendon reflexes are equal Skin: Warm & dry w/o tenting. No significant lesions or rash.  See summary under each active problem in the Problem List with associated updated therapeutic plan

## 2022-08-06 DIAGNOSIS — U071 COVID-19: Secondary | ICD-10-CM | POA: Insufficient documentation

## 2022-08-06 DIAGNOSIS — F332 Major depressive disorder, recurrent severe without psychotic features: Secondary | ICD-10-CM | POA: Diagnosis not present

## 2022-08-06 DIAGNOSIS — G2401 Drug induced subacute dyskinesia: Secondary | ICD-10-CM | POA: Diagnosis not present

## 2022-08-06 DIAGNOSIS — F411 Generalized anxiety disorder: Secondary | ICD-10-CM | POA: Diagnosis not present

## 2022-08-06 LAB — T3, FREE: T3, Free: 1.8 pg/mL — ABNORMAL LOW (ref 2.0–4.4)

## 2022-08-06 NOTE — Assessment & Plan Note (Signed)
Other than minor sore throat no C-19 symptoms suggested & exam reveals no decompensation.Chest x-ray reveals no active process.  She is already on Eliquis because of AF with RVR.  C-reactive protein was 3.5 with normals less than 1.0.  Steroids clinically not indicated. Antiviral therapy initiated.

## 2022-08-06 NOTE — Assessment & Plan Note (Addendum)
She remains on low-dose beta-blocker as well as Eliquis.  Clinically rate is adequately controlled at this time and no changes indicated.

## 2022-08-06 NOTE — Patient Instructions (Signed)
See assessment and plan under each diagnosis in the problem list and acutely for this visit 

## 2022-08-08 ENCOUNTER — Telehealth (HOSPITAL_COMMUNITY): Payer: Self-pay

## 2022-08-08 NOTE — Telephone Encounter (Signed)
Husband called stating [name] is in [facility] and will return to [place] on 08/19/2022 if possible.

## 2022-08-09 ENCOUNTER — Ambulatory Visit (HOSPITAL_COMMUNITY): Payer: PPO

## 2022-08-12 ENCOUNTER — Ambulatory Visit (HOSPITAL_COMMUNITY): Payer: PPO

## 2022-08-13 ENCOUNTER — Encounter (HOSPITAL_COMMUNITY): Payer: Self-pay

## 2022-08-13 DIAGNOSIS — M1712 Unilateral primary osteoarthritis, left knee: Secondary | ICD-10-CM | POA: Diagnosis not present

## 2022-08-14 ENCOUNTER — Inpatient Hospital Stay (HOSPITAL_COMMUNITY)
Admission: RE | Admit: 2022-08-14 | Discharge: 2022-08-23 | DRG: 488 | Disposition: A | Discharge status: Skilled Nursing Facility | Payer: PPO | Source: Ambulatory Visit | Attending: Family Medicine | Admitting: Family Medicine

## 2022-08-14 ENCOUNTER — Encounter: Payer: Self-pay | Admitting: Internal Medicine

## 2022-08-14 ENCOUNTER — Ambulatory Visit (HOSPITAL_COMMUNITY): Payer: PPO

## 2022-08-14 DIAGNOSIS — M542 Cervicalgia: Secondary | ICD-10-CM | POA: Diagnosis not present

## 2022-08-14 DIAGNOSIS — Z86718 Personal history of other venous thrombosis and embolism: Secondary | ICD-10-CM

## 2022-08-14 DIAGNOSIS — Z888 Allergy status to other drugs, medicaments and biological substances status: Secondary | ICD-10-CM

## 2022-08-14 DIAGNOSIS — G894 Chronic pain syndrome: Secondary | ICD-10-CM | POA: Diagnosis not present

## 2022-08-14 DIAGNOSIS — M9712XS Periprosthetic fracture around internal prosthetic left knee joint, sequela: Secondary | ICD-10-CM | POA: Diagnosis not present

## 2022-08-14 DIAGNOSIS — R4189 Other symptoms and signs involving cognitive functions and awareness: Secondary | ICD-10-CM | POA: Diagnosis not present

## 2022-08-14 DIAGNOSIS — N393 Stress incontinence (female) (male): Secondary | ICD-10-CM | POA: Diagnosis not present

## 2022-08-14 DIAGNOSIS — S82002A Unspecified fracture of left patella, initial encounter for closed fracture: Principal | ICD-10-CM | POA: Diagnosis present

## 2022-08-14 DIAGNOSIS — Z23 Encounter for immunization: Secondary | ICD-10-CM | POA: Diagnosis not present

## 2022-08-14 DIAGNOSIS — U071 COVID-19: Secondary | ICD-10-CM | POA: Diagnosis not present

## 2022-08-14 DIAGNOSIS — R0602 Shortness of breath: Secondary | ICD-10-CM | POA: Diagnosis not present

## 2022-08-14 DIAGNOSIS — I10 Essential (primary) hypertension: Secondary | ICD-10-CM | POA: Diagnosis not present

## 2022-08-14 DIAGNOSIS — Z8249 Family history of ischemic heart disease and other diseases of the circulatory system: Secondary | ICD-10-CM

## 2022-08-14 DIAGNOSIS — M25569 Pain in unspecified knee: Secondary | ICD-10-CM | POA: Diagnosis not present

## 2022-08-14 DIAGNOSIS — R410 Disorientation, unspecified: Secondary | ICD-10-CM | POA: Diagnosis not present

## 2022-08-14 DIAGNOSIS — Y92239 Unspecified place in hospital as the place of occurrence of the external cause: Secondary | ICD-10-CM | POA: Diagnosis present

## 2022-08-14 DIAGNOSIS — Z886 Allergy status to analgesic agent status: Secondary | ICD-10-CM

## 2022-08-14 DIAGNOSIS — R29818 Other symptoms and signs involving the nervous system: Secondary | ICD-10-CM | POA: Diagnosis present

## 2022-08-14 DIAGNOSIS — M9712XA Periprosthetic fracture around internal prosthetic left knee joint, initial encounter: Secondary | ICD-10-CM | POA: Diagnosis not present

## 2022-08-14 DIAGNOSIS — J45909 Unspecified asthma, uncomplicated: Secondary | ICD-10-CM | POA: Diagnosis not present

## 2022-08-14 DIAGNOSIS — Z79899 Other long term (current) drug therapy: Secondary | ICD-10-CM

## 2022-08-14 DIAGNOSIS — L03116 Cellulitis of left lower limb: Secondary | ICD-10-CM | POA: Diagnosis not present

## 2022-08-14 DIAGNOSIS — I4891 Unspecified atrial fibrillation: Secondary | ICD-10-CM | POA: Diagnosis present

## 2022-08-14 DIAGNOSIS — N179 Acute kidney failure, unspecified: Secondary | ICD-10-CM | POA: Diagnosis not present

## 2022-08-14 DIAGNOSIS — F32A Depression, unspecified: Secondary | ICD-10-CM | POA: Diagnosis not present

## 2022-08-14 DIAGNOSIS — Z8616 Personal history of COVID-19: Secondary | ICD-10-CM

## 2022-08-14 DIAGNOSIS — F419 Anxiety disorder, unspecified: Secondary | ICD-10-CM | POA: Diagnosis not present

## 2022-08-14 DIAGNOSIS — H9190 Unspecified hearing loss, unspecified ear: Secondary | ICD-10-CM | POA: Diagnosis not present

## 2022-08-14 DIAGNOSIS — K219 Gastro-esophageal reflux disease without esophagitis: Secondary | ICD-10-CM | POA: Diagnosis present

## 2022-08-14 DIAGNOSIS — Z743 Need for continuous supervision: Secondary | ICD-10-CM | POA: Diagnosis not present

## 2022-08-14 DIAGNOSIS — Z818 Family history of other mental and behavioral disorders: Secondary | ICD-10-CM

## 2022-08-14 DIAGNOSIS — Z882 Allergy status to sulfonamides status: Secondary | ICD-10-CM

## 2022-08-14 DIAGNOSIS — Z8709 Personal history of other diseases of the respiratory system: Secondary | ICD-10-CM | POA: Diagnosis present

## 2022-08-14 DIAGNOSIS — I6782 Cerebral ischemia: Secondary | ICD-10-CM | POA: Diagnosis not present

## 2022-08-14 DIAGNOSIS — S76112A Strain of left quadriceps muscle, fascia and tendon, initial encounter: Secondary | ICD-10-CM | POA: Diagnosis present

## 2022-08-14 DIAGNOSIS — Z7401 Bed confinement status: Secondary | ICD-10-CM | POA: Diagnosis not present

## 2022-08-14 DIAGNOSIS — T84093A Other mechanical complication of internal left knee prosthesis, initial encounter: Secondary | ICD-10-CM | POA: Diagnosis not present

## 2022-08-14 DIAGNOSIS — I480 Paroxysmal atrial fibrillation: Secondary | ICD-10-CM | POA: Diagnosis present

## 2022-08-14 DIAGNOSIS — F411 Generalized anxiety disorder: Secondary | ICD-10-CM

## 2022-08-14 DIAGNOSIS — Z7982 Long term (current) use of aspirin: Secondary | ICD-10-CM

## 2022-08-14 DIAGNOSIS — Z8673 Personal history of transient ischemic attack (TIA), and cerebral infarction without residual deficits: Secondary | ICD-10-CM | POA: Diagnosis not present

## 2022-08-14 DIAGNOSIS — M1990 Unspecified osteoarthritis, unspecified site: Secondary | ICD-10-CM | POA: Diagnosis present

## 2022-08-14 DIAGNOSIS — M978XXA Periprosthetic fracture around other internal prosthetic joint, initial encounter: Secondary | ICD-10-CM | POA: Diagnosis not present

## 2022-08-14 DIAGNOSIS — S83412A Sprain of medial collateral ligament of left knee, initial encounter: Secondary | ICD-10-CM | POA: Diagnosis not present

## 2022-08-14 DIAGNOSIS — S76102A Unspecified injury of left quadriceps muscle, fascia and tendon, initial encounter: Secondary | ICD-10-CM | POA: Diagnosis not present

## 2022-08-14 DIAGNOSIS — W19XXXA Unspecified fall, initial encounter: Secondary | ICD-10-CM | POA: Diagnosis present

## 2022-08-14 DIAGNOSIS — E785 Hyperlipidemia, unspecified: Secondary | ICD-10-CM | POA: Diagnosis not present

## 2022-08-14 DIAGNOSIS — G4700 Insomnia, unspecified: Secondary | ICD-10-CM | POA: Diagnosis present

## 2022-08-14 DIAGNOSIS — Z811 Family history of alcohol abuse and dependence: Secondary | ICD-10-CM

## 2022-08-14 DIAGNOSIS — R5381 Other malaise: Secondary | ICD-10-CM | POA: Diagnosis not present

## 2022-08-14 DIAGNOSIS — F332 Major depressive disorder, recurrent severe without psychotic features: Secondary | ICD-10-CM | POA: Diagnosis not present

## 2022-08-14 DIAGNOSIS — D62 Acute posthemorrhagic anemia: Secondary | ICD-10-CM | POA: Diagnosis not present

## 2022-08-14 DIAGNOSIS — S83422A Sprain of lateral collateral ligament of left knee, initial encounter: Secondary | ICD-10-CM | POA: Diagnosis not present

## 2022-08-14 DIAGNOSIS — G8929 Other chronic pain: Secondary | ICD-10-CM | POA: Diagnosis present

## 2022-08-14 DIAGNOSIS — I509 Heart failure, unspecified: Secondary | ICD-10-CM | POA: Diagnosis not present

## 2022-08-14 DIAGNOSIS — Z7901 Long term (current) use of anticoagulants: Secondary | ICD-10-CM

## 2022-08-14 DIAGNOSIS — S86812A Strain of other muscle(s) and tendon(s) at lower leg level, left leg, initial encounter: Secondary | ICD-10-CM | POA: Diagnosis not present

## 2022-08-14 DIAGNOSIS — Z803 Family history of malignant neoplasm of breast: Secondary | ICD-10-CM

## 2022-08-14 DIAGNOSIS — Z96652 Presence of left artificial knee joint: Secondary | ICD-10-CM | POA: Diagnosis not present

## 2022-08-14 HISTORY — DX: Chlamydia psittaci infections: A70

## 2022-08-14 HISTORY — DX: Unspecified atrial fibrillation: I48.91

## 2022-08-14 LAB — BASIC METABOLIC PANEL
Anion gap: 10 (ref 5–15)
BUN: 8 mg/dL (ref 8–23)
CO2: 26 mmol/L (ref 22–32)
Calcium: 9.1 mg/dL (ref 8.9–10.3)
Chloride: 102 mmol/L (ref 98–111)
Creatinine, Ser: 0.6 mg/dL (ref 0.44–1.00)
GFR, Estimated: >60 mL/min (ref >60)
Glucose, Bld: 121 mg/dL — ABNORMAL HIGH (ref 70–99)
Potassium: 3.6 mmol/L (ref 3.5–5.1)
Sodium: 138 mmol/L (ref 135–145)

## 2022-08-14 LAB — CBC
HCT: 31.8 % — ABNORMAL LOW (ref 36.0–46.0)
Hemoglobin: 10.5 g/dL — ABNORMAL LOW (ref 12.0–15.0)
MCH: 31.3 pg (ref 26.0–34.0)
MCHC: 33 g/dL (ref 30.0–36.0)
MCV: 94.9 fL (ref 80.0–100.0)
Platelets: 324 10*3/uL (ref 150–400)
RBC: 3.35 MIL/uL — ABNORMAL LOW (ref 3.87–5.11)
RDW: 13.9 % (ref 11.5–15.5)
WBC: 9.3 10*3/uL (ref 4.0–10.5)
nRBC: 0 % (ref 0.0–0.2)

## 2022-08-14 MED ORDER — ACETAMINOPHEN 325 MG PO TABS
650.0000 mg | ORAL_TABLET | Freq: Four times a day (QID) | ORAL | Status: DC | PRN
  Filled 2022-08-14 (×2): qty 2

## 2022-08-14 MED ORDER — DULOXETINE HCL 60 MG PO CPEP
120.0000 mg | ORAL_CAPSULE | Freq: Every day | ORAL | Status: DC
  Administered 2022-08-14 – 2022-08-22 (×9): 120 mg via ORAL
  Filled 2022-08-14 (×9): qty 2

## 2022-08-14 MED ORDER — SODIUM CHLORIDE 0.9 % IV SOLN
1.0000 g | INTRAVENOUS | Status: DC
  Administered 2022-08-14 – 2022-08-19 (×6): 1 g via INTRAVENOUS
  Filled 2022-08-14 (×6): qty 10

## 2022-08-14 MED ORDER — MIRABEGRON ER 50 MG PO TB24
50.0000 mg | ORAL_TABLET | Freq: Every day | ORAL | Status: DC
  Administered 2022-08-15 – 2022-08-23 (×8): 50 mg via ORAL
  Filled 2022-08-14 (×9): qty 1

## 2022-08-14 MED ORDER — SUMATRIPTAN SUCCINATE 100 MG PO TABS: 100.0000 mg | ORAL_TABLET | ORAL | Status: DC | PRN

## 2022-08-14 MED ORDER — BUSPIRONE HCL 10 MG PO TABS
30.0000 mg | ORAL_TABLET | Freq: Two times a day (BID) | ORAL | Status: DC
  Administered 2022-08-14 – 2022-08-23 (×18): 30 mg via ORAL
  Filled 2022-08-14 (×18): qty 3

## 2022-08-14 MED ORDER — POLYETHYLENE GLYCOL 3350 17 G PO PACK: 17.0000 g | PACK | Freq: Every day | ORAL | Status: DC | PRN

## 2022-08-14 MED ORDER — ACETAMINOPHEN 650 MG RE SUPP: 650.0000 mg | Freq: Four times a day (QID) | RECTAL | Status: DC | PRN

## 2022-08-14 MED ORDER — LORAZEPAM 0.5 MG PO TABS
0.5000 mg | ORAL_TABLET | Freq: Two times a day (BID) | ORAL | Status: DC | PRN
  Administered 2022-08-15 – 2022-08-23 (×8): 0.5 mg via ORAL
  Filled 2022-08-14 (×8): qty 1

## 2022-08-14 MED ORDER — SODIUM CHLORIDE 0.9% FLUSH
3.0000 mL | Freq: Two times a day (BID) | INTRAVENOUS | Status: DC
  Administered 2022-08-15 – 2022-08-23 (×13): 3 mL via INTRAVENOUS

## 2022-08-14 MED ORDER — ZOLPIDEM TARTRATE 5 MG PO TABS
5.0000 mg | ORAL_TABLET | Freq: Every evening | ORAL | Status: DC | PRN
  Administered 2022-08-19 – 2022-08-22 (×2): 5 mg via ORAL
  Filled 2022-08-14 (×2): qty 1

## 2022-08-14 NOTE — H&P (Signed)
History and Physical   [name] NLZ:[id number] DOB: 22-May-1946 DOA: 08/14/2022  PCP: Asencion Noble, MD   Patient coming from: Rehab facility  Chief Complaint: Knee fracture  HPI: [name] is a 76 y.o. female with medical history significant of chronic pain, stroke, hyperlipidemia, stress incontinence, asthma, GERD, atrial fibrillation, history of DVT, neurocognitive deficits, depression, anxiety, insomnia presenting after left knee fracture.  Patient is presenting as direct admission from orthopedics.  Patient had left total knee replacement on September 11 and had been recovering at rehab facility.  She had a fall while at the rehab facility and fractured her patella and her knee around the prosthetic.  She is requiring revision.  Orthopedics recommended direct admission for medical optimization prior to surgery in the next couple of days.  This is in the setting of increased weakness and fatigue/lightheadedness after testing positive for COVID-19 on September 25.  Patient has been seen by SNF provider with normal chest x-ray and completed outpatient antiviral medication.  Patient reports a fall around a week ago, shortly after his testing positive for COVID-19.  She states this occurred when attempting to transfer to a wheelchair and hitting her knee.  She is noted to have erythema and warmth of her left lower extremity from the site of her incision down to her lower leg.  Unclear etiology, she states that she thinks maybe they were seeing if she was having a reaction to a stocking she was wearing.  Patient denies fevers, chills, chest pain, shortness of breath, Donnell pain, constipation, diarrhea, nausea, vomiting.    Initial course: Initial vital signs upon arrival to the floor are stable.  Lab work-up is still pending.  Outpatient imaging by orthopedics reportedly show periprosthetic fracture and patella fracture.  Review of Systems: As per HPI otherwise all other systems reviewed  and are negative.  Past Medical History:  Diagnosis Date   Anemia    Arthritis    Asthma    Depression    H/O head injury    Hearing loss    Heart murmur    can be heard at times, Doctor said not to worry   History of kidney stones    History of stomach ulcers    Migraine    Pneumonia    Stroke [facility])     Past Surgical History:  Procedure Laterality Date   ANKLE FRACTURE SURGERY Left    APPENDECTOMY     BREAST LUMPECTOMY     CESAREAN SECTION     x 3   PARTIAL HYSTERECTOMY     ROTATOR CUFF REPAIR Right    TOTAL KNEE ARTHROPLASTY Left 07/22/2022   Procedure: TOTAL KNEE ARTHROPLASTY;  Surgeon: [name], MD;  Location: [facility];  Service: Orthopedics;  Laterality: Left;    Social History  reports that she has never smoked. She has never used smokeless tobacco. She reports current alcohol use. She reports that she does not use drugs.  Allergies  Allergen Reactions   Ticlid [Ticlopidine] Shortness Of Breath   Nsaids Other (See Comments)    Bleeding ulcer   Prednisone Other (See Comments)    Makes her stomach hurt--knows this isn't an allergy but doesn't want to take it    Monistat [Miconazole] Swelling and Rash   Sulfa Antibiotics Rash    Family History  Problem Relation Age of Onset   Cancer Father    Heart disease Brother    Alcohol abuse Brother    Depression Maternal Grandmother  Depression Grandchild    Breast cancer Daughter   Reviewed on admission  Prior to Admission medications   Medication Sig Start Date End Date Taking? Authorizing Provider  acetaminophen (TYLENOL) 500 MG tablet Take 1,000 mg by mouth every 6 (six) hours as needed for headache, moderate pain or mild pain.    [provider]  ARIPiprazole (ABILIFY) 5 MG tablet Take 1 tablet (5 mg total) by mouth daily. 06/18/22   Thayer Headings, PMHNP  aspirin EC 81 MG tablet Take 1 tablet (81 mg total) by mouth 2 (two) times daily for 28 days. Swallow whole. 07/30/22 08/27/22   [name], MD  busPIRone (BUSPAR) 30 MG tablet Take 1 tablet (30 mg total) by mouth 2 (two) times daily. 06/18/22 09/16/22  Thayer Headings, PMHNP  Cholecalciferol (VITAMIN D) 125 MCG (5000 UT) CAPS Take 5,000 Units by mouth daily.    [provider]  DULoxetine (CYMBALTA) 60 MG capsule Take 1 capsule (60 mg total) by mouth 2 (two) times daily. Patient taking differently: Take 120 mg by mouth at bedtime. 04/03/22 09/30/22  Thayer Headings, PMHNP  eszopiclone (LUNESTA) 2 MG TABS tablet Take immediately before bedtime 08/05/22   Medina-Vargas, Monina C, NP  Hydrocortisone ([id number]-95 EX) Apply 1 Application topically daily.    [provider]  LORazepam (ATIVAN) 0.5 MG tablet Take 1 tablet (0.5 mg total) by mouth 2 (two) times daily as needed for anxiety. 07/30/22   Medina-Vargas, Monina C, NP  modafinil (PROVIGIL) 200 MG tablet Take 1 tab po q am and 1/2 tab po qd prn 07/30/22   Medina-Vargas, Monina C, NP  MYRBETRIQ 50 MG TB24 tablet Take 50 mg by mouth daily. 02/13/22   [provider]  SUMAtriptan (IMITREX) 100 MG tablet Take 100 mg by mouth every 2 (two) hours as needed for migraine or headache. May repeat in 2 hours if headache persists or recurs.    [provider]  valbenazine (INGREZZA) 80 MG capsule Take 1 capsule (80 mg total) by mouth at bedtime. 07/09/22   Thayer Headings, PMHNP    Physical Exam: Vitals:   08/14/22 1844  BP: 121/73  Pulse: 100  Resp: 17  Temp: 98.5 F (36.9 C)  TempSrc: Oral  SpO2: 97%    Physical Exam Constitutional:      General: She is not in acute distress.    Appearance: Normal appearance.  HENT:     Head: Normocephalic and atraumatic.     Mouth/Throat:     Mouth: Mucous membranes are moist.     Pharynx: Oropharynx is clear.  Eyes:     Extraocular Movements: Extraocular movements intact.     Pupils: Pupils are equal, round, and reactive to light.  Cardiovascular:     Rate and Rhythm: Normal rate and regular  rhythm.     Pulses: Normal pulses.     Heart sounds: Normal heart sounds.  Pulmonary:     Effort: Pulmonary effort is normal. No respiratory distress.     Breath sounds: Normal breath sounds.  Abdominal:     General: Bowel sounds are normal. There is no distension.     Palpations: Abdomen is soft.     Tenderness: There is no abdominal tenderness.  Musculoskeletal:        General: No swelling or deformity.  Skin:    General: Skin is warm and dry.     Findings: Erythema present.     Comments: Erythema and warmth with mild swelling of left distal lower extremity  anteriorly, see image  Neurological:     General: No focal deficit present.     Mental Status: Mental status is at baseline.        Labs on Admission: I have personally reviewed following labs and imaging studies  CBC: No results for input(s): "WBC", "NEUTROABS", "HGB", "HCT", "MCV", "PLT" in the last 168 hours.  Basic Metabolic Panel: No results for input(s): "NA", "K", "CL", "CO2", "GLUCOSE", "BUN", "CREATININE", "CALCIUM", "MG", "PHOS" in the last 168 hours.  GFR: Estimated Creatinine Clearance: 69.8 mL/min (by C-G formula based on SCr of 0.66 mg/dL).  Liver Function Tests: No results for input(s): "AST", "ALT", "ALKPHOS", "BILITOT", "PROT", "ALBUMIN" in the last 168 hours.  Urine analysis: No results found for: "COLORURINE", "APPEARANCEUR", "LABSPEC", "PHURINE", "GLUCOSEU", "HGBUR", "BILIRUBINUR", "KETONESUR", "PROTEINUR", "UROBILINOGEN", "NITRITE", "LEUKOCYTESUR"  Radiological Exams on Admission: No results found.  EKG: Not performed..  Assessment/Plan Principal Problem:   Periprosthetic fracture around internal prosthetic left knee joint Active Problems:   Neck pain, chronic   History of CVA (cerebrovascular accident)   Hyperlipidemia   Asthma   Esophageal reflux   Paroxysmal atrial fibrillation (HCC)   History of DVT of lower extremity   SARS-CoV-2 positive   Neurocognitive deficits    Prosthetic fracture of left knee joint > Presenting after fall at rehab facility where she was after her left total knee replacement on September 11.  Fall occurred about a week ago when transferring. > Seen by outpatient orthopedics who performed her initial surgery and found to have periprosthetic fracture.  Directly admitted for medical optimization due to recent fatigue and weakness in the setting of recent COVID-19 infection as below. - Orthopedics notified of patient arrival, appears based on chart review procedures plan for 10/6 - Up with assistance  Rule out cellulitis > Clinical concern for possible cellulitis of the left lower extremity.  He does have warmth, erythema.  No lab work-up to indicate of any systemic infection, no fever reported. > We will go ahead and give a dose of ceftriaxone now and follow-up labs for evaluation for systemic infection.  There is no clear timeline of when this erythema started or alternative explanation at this time. - Ceftriaxone 1 g daily - Follow-up CBC  COVID-19 infection > Per signout, patient tested positive on 25 September.  Completed a course of antivirals per signout as well.  Remains on precautions for now.  History of CVA Hyperlipidemia - Holding home aspirin and Eliquis in the setting of surgery planned tomorrow. - Not on antilipidemic  Stress incontinence - Continue home Myrbetriq  Asthma GERD - Not taking anything for this per initial chart review, I do not see paperwork from facility  Atrial fibrillation - Holding home reported Eliquis  Neurocognitive deficits - Noted  Depression Anxiety Insomnia - Continue home BuSpar, duloxetine, as needed Ativan - Replace home Lunesta with formulary Ambien  DVT prophylaxis: SCDs Code Status:   Full Family Communication:  None on admission. Disposition Plan:   Patient is from:  Nursing facility  Anticipated DC to:  Nursing facility  Anticipated DC date:  2 to 4  days  Anticipated DC barriers: None  Consults called:  Orthopedics requested direct admission, notified of patient arrival. Admission status:  Inpatient, MedSurg  Severity of Illness: The appropriate patient status for this patient is INPATIENT. Inpatient status is judged to be reasonable and necessary in order to provide the required intensity of service to ensure the patient's safety. The patient's presenting symptoms, physical exam findings, and initial  radiographic and laboratory data in the context of their chronic comorbidities is felt to place them at high risk for further clinical deterioration. Furthermore, it is not anticipated that the patient will be medically stable for discharge from the hospital within 2 midnights of admission.   * I certify that at the point of admission it is my clinical judgment that the patient will require inpatient hospital care spanning beyond 2 midnights from the point of admission due to high intensity of service, high risk for further deterioration and high frequency of surveillance required.Marcelyn Bruins MD [facility]  How to contact the [facility] Attending or Consulting provider [place] or covering provider during after hours [place], for this patient?   Check the care team in [facility] and look for a) attending/consulting [facility] provider listed and b) the [facility] team listed Log into [email or web address] and use [facility] universal password to access. If you do not have the password, please contact the hospital operator. Locate the [facility] provider you are looking for under [facility] and page to a number that you can be directly reached. If you still have difficulty reaching the provider, please page the [facility] (Director on Call) for the Hospitalists listed on amion for assistance.  08/14/2022, 7:07 PM

## 2022-08-15 ENCOUNTER — Encounter (HOSPITAL_COMMUNITY): Payer: Self-pay | Admitting: Internal Medicine

## 2022-08-15 ENCOUNTER — Other Ambulatory Visit: Payer: Self-pay

## 2022-08-15 ENCOUNTER — Inpatient Hospital Stay (HOSPITAL_COMMUNITY): Payer: PPO

## 2022-08-15 DIAGNOSIS — M9712XA Periprosthetic fracture around internal prosthetic left knee joint, initial encounter: Secondary | ICD-10-CM | POA: Diagnosis not present

## 2022-08-15 DIAGNOSIS — I509 Heart failure, unspecified: Secondary | ICD-10-CM | POA: Diagnosis not present

## 2022-08-15 LAB — CBC
HCT: 31.1 % — ABNORMAL LOW (ref 36.0–46.0)
Hemoglobin: 10.4 g/dL — ABNORMAL LOW (ref 12.0–15.0)
MCH: 31.6 pg (ref 26.0–34.0)
MCHC: 33.4 g/dL (ref 30.0–36.0)
MCV: 94.5 fL (ref 80.0–100.0)
Platelets: 319 10*3/uL (ref 150–400)
RBC: 3.29 MIL/uL — ABNORMAL LOW (ref 3.87–5.11)
RDW: 13.8 % (ref 11.5–15.5)
WBC: 7.1 10*3/uL (ref 4.0–10.5)
nRBC: 0 % (ref 0.0–0.2)

## 2022-08-15 LAB — BASIC METABOLIC PANEL
Anion gap: 10 (ref 5–15)
BUN: 8 mg/dL (ref 8–23)
CO2: 27 mmol/L (ref 22–32)
Calcium: 9 mg/dL (ref 8.9–10.3)
Chloride: 101 mmol/L (ref 98–111)
Creatinine, Ser: 0.6 mg/dL (ref 0.44–1.00)
GFR, Estimated: >60 mL/min (ref >60)
Glucose, Bld: 99 mg/dL (ref 70–99)
Potassium: 3.7 mmol/L (ref 3.5–5.1)
Sodium: 138 mmol/L (ref 135–145)

## 2022-08-15 LAB — BRAIN NATRIURETIC PEPTIDE: B Natriuretic Peptide: 154.2 pg/mL — ABNORMAL HIGH (ref 0.0–100.0)

## 2022-08-15 MED ORDER — FUROSEMIDE 10 MG/ML IJ SOLN
40.0000 mg | Freq: Two times a day (BID) | INTRAMUSCULAR | Status: DC
  Administered 2022-08-15 – 2022-08-16 (×2): 40 mg via INTRAVENOUS
  Filled 2022-08-15 (×2): qty 4

## 2022-08-15 NOTE — Care Plan (Signed)
Ortho Bundle Case Management Note  Patient Details  Name: [name] MRN: [id number] Date of Birth: [birth year]   Patient had a   L total knee replacement on 07/22/22. She was discharged to [facility] for Rehab. Very slow to progress with therapy in house. They plan is for her to return there at discharge. Please feel free to contact me with any questions or concerns                DME Arranged:    DME Agency:     HH Arranged:    [place] Agency:     Additional Comments: Please contact me with any questions of if this plan should need to change.    08/15/2022, 11:22 AM

## 2022-08-15 NOTE — H&P (View-Only) (Signed)
ORTHOPAEDIC CONSULTATION  REQUESTING PHYSICIAN: Shelly Coss, MD  Chief Complaint: left patella fracture  HPI: MARIEANN ZIPP is a 76 y.o. female who underwent total knee arthroplasty on 07/22/2022.  Postoperative course was complicated by weakness and deconditioning.  She had difficulty mobilizing with physical therapy ultimately discharged to a SNF.  She missed her initial postop because she was diagnosed with COVID at the SNF.  She finally presented for first postoperative visit to my clinic yesterday.  She reports that she had been doing well overall but that few days ago she hit her knee hard on a chair while getting from the chair to the toilet and developed significant bruising pain and swelling in the knee.  Had difficulty with extension and mobility as well.  Unfortunately in the clinic x-rays demonstrated proximal migrated displaced patella fracture.  Unable to demonstrate a straight leg raise concerning for disruption of her extensor mechanism.  She also ports that she has felt lightheaded and dizzy over the past week or so contributing to her loss of balance when transferring and injuring her knee.  Given the systemic symptoms recommended admission to the hospital for preoperative optimization and resuscitation with plan for surgery to repair her extensor mechanism.  She denies distal numbness and tingling she denies pain in other joints or extremities.   Past Medical History:  Diagnosis Date   Anemia    Arthritis    Asthma    Depression    H/O head injury    Hearing loss    Heart murmur    can be heard at times, Doctor said not to worry   History of kidney stones    History of stomach ulcers    Migraine    Pneumonia    Stroke [facility])    Past Surgical History:  Procedure Laterality Date   ANKLE FRACTURE SURGERY Left    APPENDECTOMY     BREAST LUMPECTOMY     CESAREAN SECTION     x 3   PARTIAL HYSTERECTOMY     ROTATOR CUFF REPAIR Right    TOTAL KNEE ARTHROPLASTY Left  07/22/2022   Procedure: TOTAL KNEE ARTHROPLASTY;  Surgeon: [name], MD;  Location: [facility];  Service: Orthopedics;  Laterality: Left;   Social History   Socioeconomic History   Marital status: Married    Spouse name: Not on file   Number of children: 3   Years of education: Not on file   Highest education level: Not on file  Occupational History   Occupation: retired  Tobacco Use   Smoking status: Never   Smokeless tobacco: Never  Vaping Use   Vaping Use: Never used  Substance and Sexual Activity   Alcohol use: Yes    Comment: occ beer   Drug use: No   Sexual activity: Yes    Birth control/protection: Surgical    Comment: hyst  Other Topics Concern   Not on file  Social History Narrative   Not on file   Social Determinants of Health   Financial Resource Strain: Medium Risk (01/30/2021)   Overall Financial Resource Strain (CARDIA)    Difficulty of Paying Living Expenses: Somewhat hard  Food Insecurity: No Food Insecurity (08/15/2022)   Hunger Vital Sign    Worried About Running Out of Food in the Last Year: Never true    Ran Out of Food in the Last Year: Never true  Transportation Needs: No Transportation Needs (08/15/2022)   PRAPARE - Transportation    Lack of Transportation (  Medical): No    Lack of Transportation (Non-Medical): No  Physical Activity: Insufficiently Active (01/30/2021)   Exercise Vital Sign    Days of Exercise per Week: 2 days    Minutes of Exercise per Session: 10 min  Stress: No Stress Concern Present (01/30/2021)   [place]    Feeling of Stress : Only a little  Social Connections: Moderately Integrated (01/30/2021)   Social Connection and Isolation Panel [NHANES]    Frequency of Communication with Friends and Family: Twice a week    Frequency of Social Gatherings with Friends and Family: Twice a week    Attends Religious Services: More than 4 times per year    Active  Member of [employer] or Organizations: No    Attends Music therapist: Never    Marital Status: Married   Family History  Problem Relation Age of Onset   Cancer Father    Heart disease Brother    Alcohol abuse Brother    Depression Maternal Grandmother    Depression Grandchild    Breast cancer Daughter    Allergies  Allergen Reactions   Ticlid [Ticlopidine] Shortness Of Breath   Nsaids Other (See Comments)    Bleeding ulcer   Prednisone Other (See Comments)    Makes her stomach hurt--knows this isn't an allergy but doesn't want to take it    Monistat [Miconazole] Swelling and Rash   Sulfa Antibiotics Rash     Positive ROS: All other systems have been reviewed and were otherwise negative with the exception of those mentioned in the HPI and as above.  Physical Exam: General: Alert, no acute distress Cardiovascular: No pedal edema Respiratory: No cyanosis, no use of accessory musculature Skin: No lesions in the area of chief complaint Neurologic: Sensation intact distally Psychiatric: Patient is competent for consent with normal mood and affect  MUSCULOSKELETAL:  LLE anterior midline incision over the knee healing well with no drainage.   There is moderate ecchymosis along the medial side of the knee, there is moderate pretibial erythema extending up to the inferior aspect of the knee incision.  Unable to demonstrate straight leg straight leg raise.,  Able to passively extend and gently range at range of the knee with minimal discomfort  Sens DPN, SPN, TN intact  Motor EHL, ext, flex 5/5  Mild distal edema lower extremities pitting to the mid calf  Feet are warm and well-perfused   IMAGING: Clinic x-rays reviewed demonstrate intact femoral and tibial components.  Proximal migration of the patella with fracture of the inferior pole the patella  Assessment: Principal Problem:   Periprosthetic fracture around internal prosthetic left knee joint Active Problems:    Neck pain, chronic   History of CVA (cerebrovascular accident)   Hyperlipidemia   Asthma   Esophageal reflux   Paroxysmal atrial fibrillation (HCC)   History of DVT of lower extremity   SARS-CoV-2 positive   Neurocognitive deficits   Displaced left inferior pole patella fracture total knee arthroplasty with disruption of the extensor mechanism  Plan: In discussion with the patient today again in the hospital as well as yesterday in the clinic as well as with her husband regarding the findings on her x-ray and exam.  Concerned that without surgical intervention she will have significant difficulty with ambulation with complete disruption of her extensor mechanism.  Recommend repair of the extra extensor mechanism as well as augmentation with hamstring autograft or allograft.  My partner Dr. Griffin Basil  will be there to assist with the repair.  She also has mild pretibial erythema which may be secondary to the trauma we will plan for washout of the knee and likely poly exchange.  We will plan to send Intra-Op synovial tissue cultures. Discussed plan to immobilize patient in extension for likely 6 weeks to allow for healing of the extensor mechanism. Discussed additional risks including infection, bleeding, stiffness, leg length discrepancy, nerve injury, the need for revision surgery, hardware prominence, hardware failure, the need for hardware removal, blood clots, cardiopulmonary complications, morbidity, mortality, among others, and they were willing to proceed.    [name], MD  Contact information:   [facility]EE[facility] 7am-5pm epic message Dr. Zachery Dakins, or call office for patient follow up: [phone] After hours and holidays please check [email or web address] for group call information for [facility]

## 2022-08-15 NOTE — Progress Notes (Signed)
PROGRESS NOTE  [name]  ZOX:[id number] DOB: [birth year] DOA: 08/14/2022 PCP: Asencion Noble, MD   Brief Narrative:  Patient is a 89 female with history of chronic pain syndrome, stroke, hyperlipidemia, stress incontinence, asthma, GERD, A-fib, DVT, depression, anxiety who presented from orthopedics office for management of left knee fracture.  She had a left total knee replacement on September 11 and was recovering at rehab facility but had a fall while at rehab facility and fractured her patella and her knee .  She was recently diagnosed with COVID on September 25 and was weak and lightheaded and fell.  No evidence of pneumonia as per chest x-ray that was done as an outpatient, she completed outpatient antiviral medication.  Orthopedics following.  Assessment & Plan:  Principal Problem:   Periprosthetic fracture around internal prosthetic left knee joint Active Problems:   Neck pain, chronic   History of CVA (cerebrovascular accident)   Hyperlipidemia   Asthma   Esophageal reflux   Paroxysmal atrial fibrillation (HCC)   History of DVT of lower extremity   SARS-CoV-2 positive   Neurocognitive deficits  Prosthetic fracture of left knee joint: Fell at [facility].  Status post left total knee replacement on September 11.  Orthopedics following.  Further management as per orthopedics.  Suspected cellulitis: Left lower extremity also appeared erythematous, warm.  No fever.  Started on ceftriaxone with improvement  COVID infection: Tested  positive on September 25.  Completed course of antiviral.  On room air.  On precautions  History of CVA/hyperlipidemia: On aspirin, Eliquis at home, currently on hold for anticipation of surgery  Stress incontinence: Continue home Myrbetriq  Paroxysmal A-fib: On Eliquis at home, currently on hold for possible surgery .Not on rate control meds  Neurocognitive deficits: Continue supportive care , delirium precautions  Depression/anxiety/insomnia: Continue  home BuSpar, duloxetine  Bilateral lower extremity edema: Unclear reason.  Will check echo and BNP           DVT prophylaxis:SCDs Start: 08/14/22 1906     Code Status: Full Code  Family Communication: None at bedside  Patient status:Inpatient  Patient is from :Home  Anticipated discharge to:SnF  Estimated DC date:not sure   Consultants: Orthopedics  Procedures:None yet  Antimicrobials:  Anti-infectives (From admission, onward)    Start     Dose/Rate Route Frequency Ordered Stop   08/14/22 2100  cefTRIAXone (ROCEPHIN) 1 g in sodium chloride 0.9 % 100 mL IVPB        1 g 200 mL/hr over 30 Minutes Intravenous Every 24 hours 08/14/22 2011 08/21/22 2059       Subjective: Patient seen and examined at bedside today.  Hemodynamically stable.  Lying comfortably in the bed.  Appears deconditioned.  Has bilateral lower extremity edema.  Redness of the left knee has improved.  Denies any new complaints.  Objective: Vitals:   08/14/22 1844 08/14/22 2000  BP: 121/73 (!) 110/55  Pulse: 100 100  Resp: 17 18  Temp: 98.5 F (36.9 C) 98.7 F (37.1 C)  TempSrc: Oral Oral  SpO2: 97% 100%   No intake or output data in the 24 hours ending 08/15/22 0808 There were no vitals filed for this visit.  Examination:  General exam: Overall comfortable, not in distress, obese HEENT: PERRL Respiratory system: Crackles  on the left base Cardiovascular system: S1 & S2 heard, RRR.  Gastrointestinal system: Abdomen is nondistended, soft and nontender. Central nervous system: Alert and oriented Extremities: bilateral lower extremity pitting edema, no clubbing ,no cyanosis, dressing  over the left knee, erythematous area around the left knee Skin: No ulcers,no icterus     Data Reviewed: I have personally reviewed following labs and imaging studies  CBC: Recent Labs  Lab 08/14/22 2032 08/15/22 0254  WBC 9.3 7.1  HGB 10.5* 10.4*  HCT 31.8* 31.1*  MCV 94.9 94.5  PLT 324 753    Basic Metabolic Panel: Recent Labs  Lab 08/14/22 2032 08/15/22 0254  NA 138 138  K 3.6 3.7  CL 102 101  CO2 26 27  GLUCOSE 121* 99  BUN 8 8  CREATININE 0.60 0.60  CALCIUM 9.1 9.0     No results found for this or any previous visit (from the past 240 hour(s)).   Radiology Studies: No results found.  Scheduled Meds:  busPIRone  30 mg Oral BID   DULoxetine  120 mg Oral QHS   mirabegron ER  50 mg Oral Daily   sodium chloride flush  3 mL Intravenous Q12H   Continuous Infusions:  cefTRIAXone (ROCEPHIN)  IV 1 g (08/14/22 2228)     LOS: 1 day   Shelly Coss, MD [facility] P10/03/2022, 8:08 AM

## 2022-08-15 NOTE — Consult Note (Signed)
ORTHOPAEDIC CONSULTATION  REQUESTING PHYSICIAN: Shelly Coss, MD  Chief Complaint: left patella fracture  HPI: [name] is a 76 y.o. female who underwent total knee arthroplasty on 07/22/2022.  Postoperative course was complicated by weakness and deconditioning.  She had difficulty mobilizing with physical therapy ultimately discharged to a SNF.  She missed her initial postop because she was diagnosed with COVID at the SNF.  She finally presented for first postoperative visit to my clinic yesterday.  She reports that she had been doing well overall but that few days ago she hit her knee hard on a chair while getting from the chair to the toilet and developed significant bruising pain and swelling in the knee.  Had difficulty with extension and mobility as well.  Unfortunately in the clinic x-rays demonstrated proximal migrated displaced patella fracture.  Unable to demonstrate a straight leg raise concerning for disruption of her extensor mechanism.  She also ports that she has felt lightheaded and dizzy over the past week or so contributing to her loss of balance when transferring and injuring her knee.  Given the systemic symptoms recommended admission to the hospital for preoperative optimization and resuscitation with plan for surgery to repair her extensor mechanism.  She denies distal numbness and tingling she denies pain in other joints or extremities.   Past Medical History:  Diagnosis Date   Anemia    Arthritis    Asthma    Depression    H/O head injury    Hearing loss    Heart murmur    can be heard at times, Doctor said not to worry   History of kidney stones    History of stomach ulcers    Migraine    Pneumonia    Stroke [facility])    Past Surgical History:  Procedure Laterality Date   ANKLE FRACTURE SURGERY Left    APPENDECTOMY     BREAST LUMPECTOMY     CESAREAN SECTION     x 3   PARTIAL HYSTERECTOMY     ROTATOR CUFF REPAIR Right    TOTAL KNEE ARTHROPLASTY Left  07/22/2022   Procedure: TOTAL KNEE ARTHROPLASTY;  Surgeon: [name], MD;  Location: [facility];  Service: Orthopedics;  Laterality: Left;   Social History   Socioeconomic History   Marital status: Married    Spouse name: Not on file   Number of children: 3   Years of education: Not on file   Highest education level: Not on file  Occupational History   Occupation: retired  Tobacco Use   Smoking status: Never   Smokeless tobacco: Never  Vaping Use   Vaping Use: Never used  Substance and Sexual Activity   Alcohol use: Yes    Comment: occ beer   Drug use: No   Sexual activity: Yes    Birth control/protection: Surgical    Comment: hyst  Other Topics Concern   Not on file  Social History Narrative   Not on file   Social Determinants of Health   Financial Resource Strain: Medium Risk (01/30/2021)   Overall Financial Resource Strain (CARDIA)    Difficulty of Paying Living Expenses: Somewhat hard  Food Insecurity: No Food Insecurity (08/15/2022)   Hunger Vital Sign    Worried About Running Out of Food in the Last Year: Never true    Ran Out of Food in the Last Year: Never true  Transportation Needs: No Transportation Needs (08/15/2022)   PRAPARE - Transportation    Lack of Transportation (  Medical): No    Lack of Transportation (Non-Medical): No  Physical Activity: Insufficiently Active (01/30/2021)   Exercise Vital Sign    Days of Exercise per Week: 2 days    Minutes of Exercise per Session: 10 min  Stress: No Stress Concern Present (01/30/2021)   [place]    Feeling of Stress : Only a little  Social Connections: Moderately Integrated (01/30/2021)   Social Connection and Isolation Panel [NHANES]    Frequency of Communication with Friends and Family: Twice a week    Frequency of Social Gatherings with Friends and Family: Twice a week    Attends Religious Services: More than 4 times per year    Active  Member of [employer] or Organizations: No    Attends Music therapist: Never    Marital Status: Married   Family History  Problem Relation Age of Onset   Cancer Father    Heart disease Brother    Alcohol abuse Brother    Depression Maternal Grandmother    Depression Grandchild    Breast cancer Daughter    Allergies  Allergen Reactions   Ticlid [Ticlopidine] Shortness Of Breath   Nsaids Other (See Comments)    Bleeding ulcer   Prednisone Other (See Comments)    Makes her stomach hurt--knows this isn't an allergy but doesn't want to take it    Monistat [Miconazole] Swelling and Rash   Sulfa Antibiotics Rash     Positive ROS: All other systems have been reviewed and were otherwise negative with the exception of those mentioned in the HPI and as above.  Physical Exam: General: Alert, no acute distress Cardiovascular: No pedal edema Respiratory: No cyanosis, no use of accessory musculature Skin: No lesions in the area of chief complaint Neurologic: Sensation intact distally Psychiatric: Patient is competent for consent with normal mood and affect  MUSCULOSKELETAL:  LLE anterior midline incision over the knee healing well with no drainage.   There is moderate ecchymosis along the medial side of the knee, there is moderate pretibial erythema extending up to the inferior aspect of the knee incision.  Unable to demonstrate straight leg straight leg raise.,  Able to passively extend and gently range at range of the knee with minimal discomfort  Sens DPN, SPN, TN intact  Motor EHL, ext, flex 5/5  Mild distal edema lower extremities pitting to the mid calf  Feet are warm and well-perfused   IMAGING: Clinic x-rays reviewed demonstrate intact femoral and tibial components.  Proximal migration of the patella with fracture of the inferior pole the patella  Assessment: Principal Problem:   Periprosthetic fracture around internal prosthetic left knee joint Active Problems:    Neck pain, chronic   History of CVA (cerebrovascular accident)   Hyperlipidemia   Asthma   Esophageal reflux   Paroxysmal atrial fibrillation (HCC)   History of DVT of lower extremity   SARS-CoV-2 positive   Neurocognitive deficits   Displaced left inferior pole patella fracture total knee arthroplasty with disruption of the extensor mechanism  Plan: In discussion with the patient today again in the hospital as well as yesterday in the clinic as well as with her husband regarding the findings on her x-ray and exam.  Concerned that without surgical intervention she will have significant difficulty with ambulation with complete disruption of her extensor mechanism.  Recommend repair of the extra extensor mechanism as well as augmentation with hamstring autograft or allograft.  My partner Dr. Griffin Basil  will be there to assist with the repair.  She also has mild pretibial erythema which may be secondary to the trauma we will plan for washout of the knee and likely poly exchange.  We will plan to send Intra-Op synovial tissue cultures. Discussed plan to immobilize patient in extension for likely 6 weeks to allow for healing of the extensor mechanism. Discussed additional risks including infection, bleeding, stiffness, leg length discrepancy, nerve injury, the need for revision surgery, hardware prominence, hardware failure, the need for hardware removal, blood clots, cardiopulmonary complications, morbidity, mortality, among others, and they were willing to proceed.    [name], MD  Contact information:   [id number] 7am-5pm epic message Dr. Zachery Dakins, or call office for patient follow up: [phone] After hours and holidays please check [email or web address] for group call information for [facility]

## 2022-08-16 ENCOUNTER — Encounter (HOSPITAL_COMMUNITY): Payer: Self-pay | Admitting: Internal Medicine

## 2022-08-16 ENCOUNTER — Inpatient Hospital Stay (HOSPITAL_COMMUNITY): Admission: RE | Admit: 2022-08-16 | Payer: PPO | Source: Home / Self Care | Admitting: Orthopedic Surgery

## 2022-08-16 ENCOUNTER — Inpatient Hospital Stay (HOSPITAL_COMMUNITY): Payer: PPO | Admitting: Certified Registered Nurse Anesthetist

## 2022-08-16 ENCOUNTER — Other Ambulatory Visit: Payer: Self-pay

## 2022-08-16 ENCOUNTER — Encounter (HOSPITAL_COMMUNITY)
Admission: RE | Disposition: A | Discharge status: Skilled Nursing Facility | Payer: Self-pay | Source: Ambulatory Visit | Attending: Internal Medicine

## 2022-08-16 ENCOUNTER — Inpatient Hospital Stay (HOSPITAL_COMMUNITY): Payer: PPO

## 2022-08-16 DIAGNOSIS — M9712XS Periprosthetic fracture around internal prosthetic left knee joint, sequela: Secondary | ICD-10-CM

## 2022-08-16 DIAGNOSIS — S86812A Strain of other muscle(s) and tendon(s) at lower leg level, left leg, initial encounter: Secondary | ICD-10-CM

## 2022-08-16 DIAGNOSIS — M9712XA Periprosthetic fracture around internal prosthetic left knee joint, initial encounter: Secondary | ICD-10-CM | POA: Diagnosis not present

## 2022-08-16 HISTORY — PX: KNEE ARTHROSCOPY WITH PATELLAR TENDON REPAIR: SHX5656

## 2022-08-16 HISTORY — PX: I & D KNEE WITH POLY EXCHANGE: SHX5024

## 2022-08-16 LAB — SURGICAL PCR SCREEN
MRSA, PCR: NEGATIVE
MRSA, PCR: NEGATIVE
Staphylococcus aureus: NEGATIVE
Staphylococcus aureus: NEGATIVE

## 2022-08-16 LAB — BASIC METABOLIC PANEL
Anion gap: 11 (ref 5–15)
BUN: 9 mg/dL (ref 8–23)
CO2: 30 mmol/L (ref 22–32)
Calcium: 9.1 mg/dL (ref 8.9–10.3)
Chloride: 98 mmol/L (ref 98–111)
Creatinine, Ser: 0.69 mg/dL (ref 0.44–1.00)
GFR, Estimated: >60 mL/min (ref >60)
Glucose, Bld: 103 mg/dL — ABNORMAL HIGH (ref 70–99)
Potassium: 3.6 mmol/L (ref 3.5–5.1)
Sodium: 139 mmol/L (ref 135–145)

## 2022-08-16 LAB — ECHOCARDIOGRAM COMPLETE
AR max vel: 1.89 cm2
AV Area VTI: 2 cm2
AV Area mean vel: 1.92 cm2
AV Mean grad: 7 mmHg
AV Peak grad: 13.1 mmHg
Ao pk vel: 1.81 m/s
MV M vel: 3.06 m/s
MV Peak grad: 37.5 mmHg
S' Lateral: 2.6 cm

## 2022-08-16 SURGERY — KNEE ARTHROSCOPY WITH PATELLAR TENDON REPAIR
Anesthesia: General | Site: Knee | Laterality: Left

## 2022-08-16 MED ORDER — METHOCARBAMOL 1000 MG/10ML IJ SOLN: 500.0000 mg | Freq: Four times a day (QID) | INTRAVENOUS | Status: DC | PRN

## 2022-08-16 MED ORDER — CEFAZOLIN SODIUM-DEXTROSE 2-4 GM/100ML-% IV SOLN: 2.0000 g | Freq: Four times a day (QID) | INTRAVENOUS | Status: DC

## 2022-08-16 MED ORDER — DILTIAZEM HCL-DEXTROSE 125-5 MG/125ML-% IV SOLN (PREMIX)
5.0000 mg/h | INTRAVENOUS | Status: DC
  Administered 2022-08-16: 5 mg/h via INTRAVENOUS
  Administered 2022-08-16: 12.5 mg/h via INTRAVENOUS
  Administered 2022-08-17: 10 mg/h via INTRAVENOUS
  Filled 2022-08-16 (×4): qty 125

## 2022-08-16 MED ORDER — DOCUSATE SODIUM 100 MG PO CAPS
100.0000 mg | ORAL_CAPSULE | Freq: Two times a day (BID) | ORAL | Status: DC
  Administered 2022-08-16 – 2022-08-23 (×14): 100 mg via ORAL
  Filled 2022-08-16 (×14): qty 1

## 2022-08-16 MED ORDER — ALUM & MAG HYDROXIDE-SIMETH 200-200-20 MG/5ML PO SUSP: 30.0000 mL | ORAL | Status: DC | PRN

## 2022-08-16 MED ORDER — KETOROLAC TROMETHAMINE 30 MG/ML IJ SOLN
INTRAMUSCULAR | Status: AC
  Filled 2022-08-16: qty 2

## 2022-08-16 MED ORDER — HYDROCODONE-ACETAMINOPHEN 5-325 MG PO TABS
1.0000 | ORAL_TABLET | ORAL | Status: DC | PRN
  Administered 2022-08-16 – 2022-08-17 (×2): 1 via ORAL
  Filled 2022-08-16 (×2): qty 1

## 2022-08-16 MED ORDER — ACETAMINOPHEN 500 MG PO TABS
500.0000 mg | ORAL_TABLET | Freq: Four times a day (QID) | ORAL | Status: AC
  Administered 2022-08-17 (×2): 500 mg via ORAL
  Filled 2022-08-16 (×3): qty 1

## 2022-08-16 MED ORDER — EPHEDRINE 5 MG/ML INJ
INTRAVENOUS | Status: AC
  Filled 2022-08-16: qty 5

## 2022-08-16 MED ORDER — POVIDONE-IODINE 10 % EX SWAB
2.0000 | Freq: Once | CUTANEOUS | Status: AC
  Administered 2022-08-16: 2 via TOPICAL

## 2022-08-16 MED ORDER — LABETALOL HCL 5 MG/ML IV SOLN
INTRAVENOUS | Status: AC
  Filled 2022-08-16: qty 4

## 2022-08-16 MED ORDER — LABETALOL HCL 5 MG/ML IV SOLN
INTRAVENOUS | Status: DC | PRN
  Administered 2022-08-16: 5 mg via INTRAVENOUS

## 2022-08-16 MED ORDER — APIXABAN 2.5 MG PO TABS
2.5000 mg | ORAL_TABLET | Freq: Two times a day (BID) | ORAL | Status: DC
  Administered 2022-08-17: 2.5 mg via ORAL
  Filled 2022-08-16: qty 1

## 2022-08-16 MED ORDER — PANTOPRAZOLE SODIUM 40 MG PO TBEC
40.0000 mg | DELAYED_RELEASE_TABLET | Freq: Every day | ORAL | Status: DC
  Administered 2022-08-17 – 2022-08-23 (×7): 40 mg via ORAL
  Filled 2022-08-16 (×8): qty 1

## 2022-08-16 MED ORDER — HYDROMORPHONE HCL 1 MG/ML IJ SOLN
INTRAMUSCULAR | Status: DC | PRN
  Administered 2022-08-16: .5 mg via INTRAVENOUS

## 2022-08-16 MED ORDER — DIPHENHYDRAMINE HCL 12.5 MG/5ML PO ELIX: 12.5000 mg | ORAL_SOLUTION | ORAL | Status: DC | PRN

## 2022-08-16 MED ORDER — FENTANYL CITRATE (PF) 250 MCG/5ML IJ SOLN
INTRAMUSCULAR | Status: AC
  Filled 2022-08-16: qty 5

## 2022-08-16 MED ORDER — ONDANSETRON HCL 4 MG/2ML IJ SOLN
INTRAMUSCULAR | Status: DC | PRN
  Administered 2022-08-16: 4 mg via INTRAVENOUS

## 2022-08-16 MED ORDER — CHLORHEXIDINE GLUCONATE 0.12 % MT SOLN
OROMUCOSAL | Status: AC
  Administered 2022-08-16: 15 mL via OROMUCOSAL
  Filled 2022-08-16: qty 15

## 2022-08-16 MED ORDER — ONDANSETRON HCL 4 MG/2ML IJ SOLN: 4.0000 mg | Freq: Four times a day (QID) | INTRAMUSCULAR | Status: DC | PRN

## 2022-08-16 MED ORDER — CHLORHEXIDINE GLUCONATE 4 % EX LIQD: 60.0000 mL | Freq: Once | CUTANEOUS | Status: DC

## 2022-08-16 MED ORDER — TRANEXAMIC ACID-NACL 1000-0.7 MG/100ML-% IV SOLN
1000.0000 mg | INTRAVENOUS | Status: AC
  Administered 2022-08-16: 1000 mg via INTRAVENOUS
  Filled 2022-08-16: qty 100

## 2022-08-16 MED ORDER — LACTATED RINGERS IV SOLN: INTRAVENOUS | Status: DC

## 2022-08-16 MED ORDER — HYDROCODONE-ACETAMINOPHEN 7.5-325 MG PO TABS
1.0000 | ORAL_TABLET | ORAL | Status: DC | PRN
  Administered 2022-08-17 – 2022-08-20 (×4): 1 via ORAL
  Filled 2022-08-16 (×4): qty 1

## 2022-08-16 MED ORDER — METHOCARBAMOL 500 MG PO TABS
500.0000 mg | ORAL_TABLET | Freq: Four times a day (QID) | ORAL | Status: DC | PRN
  Administered 2022-08-17 – 2022-08-23 (×10): 500 mg via ORAL
  Filled 2022-08-16 (×10): qty 1

## 2022-08-16 MED ORDER — ONDANSETRON HCL 4 MG PO TABS: 4.0000 mg | ORAL_TABLET | Freq: Four times a day (QID) | ORAL | Status: DC | PRN

## 2022-08-16 MED ORDER — METOPROLOL TARTRATE 5 MG/5ML IV SOLN
INTRAVENOUS | Status: AC
  Filled 2022-08-16: qty 5

## 2022-08-16 MED ORDER — CEFAZOLIN SODIUM-DEXTROSE 2-4 GM/100ML-% IV SOLN
2.0000 g | INTRAVENOUS | Status: AC
  Administered 2022-08-16: 2 g via INTRAVENOUS
  Filled 2022-08-16: qty 100

## 2022-08-16 MED ORDER — LIDOCAINE 2% (20 MG/ML) 5 ML SYRINGE
INTRAMUSCULAR | Status: DC | PRN
  Administered 2022-08-16: 100 mg via INTRAVENOUS

## 2022-08-16 MED ORDER — LIDOCAINE 2% (20 MG/ML) 5 ML SYRINGE
INTRAMUSCULAR | Status: AC
  Filled 2022-08-16: qty 15

## 2022-08-16 MED ORDER — DEXAMETHASONE SODIUM PHOSPHATE 10 MG/ML IJ SOLN
INTRAMUSCULAR | Status: AC
  Filled 2022-08-16: qty 1

## 2022-08-16 MED ORDER — SODIUM CHLORIDE 0.9 % IR SOLN
Status: DC | PRN
  Administered 2022-08-16: 3000 mL

## 2022-08-16 MED ORDER — HYDROMORPHONE HCL 1 MG/ML IJ SOLN
INTRAMUSCULAR | Status: AC
  Filled 2022-08-16: qty 0.5

## 2022-08-16 MED ORDER — ROCURONIUM BROMIDE 10 MG/ML (PF) SYRINGE
PREFILLED_SYRINGE | INTRAVENOUS | Status: DC | PRN
  Administered 2022-08-16: 20 mg via INTRAVENOUS
  Administered 2022-08-16: 60 mg via INTRAVENOUS

## 2022-08-16 MED ORDER — PHENOL 1.4 % MT LIQD: 1.0000 | OROMUCOSAL | Status: DC | PRN

## 2022-08-16 MED ORDER — FUROSEMIDE 10 MG/ML IJ SOLN
40.0000 mg | Freq: Once | INTRAMUSCULAR | Status: DC
  Filled 2022-08-16: qty 4

## 2022-08-16 MED ORDER — AMISULPRIDE (ANTIEMETIC) 5 MG/2ML IV SOLN: 10.0000 mg | Freq: Once | INTRAVENOUS | Status: DC | PRN

## 2022-08-16 MED ORDER — FENTANYL CITRATE (PF) 250 MCG/5ML IJ SOLN
INTRAMUSCULAR | Status: DC | PRN
  Administered 2022-08-16: 50 ug via INTRAVENOUS
  Administered 2022-08-16 (×2): 100 ug via INTRAVENOUS

## 2022-08-16 MED ORDER — ACETAMINOPHEN 10 MG/ML IV SOLN
INTRAVENOUS | Status: AC
  Filled 2022-08-16: qty 100

## 2022-08-16 MED ORDER — ESMOLOL HCL 100 MG/10ML IV SOLN
INTRAVENOUS | Status: DC | PRN
  Administered 2022-08-16: 20 mg via INTRAVENOUS
  Administered 2022-08-16: 30 mg via INTRAVENOUS
  Administered 2022-08-16: 20 mg via INTRAVENOUS

## 2022-08-16 MED ORDER — BUPIVACAINE HCL (PF) 0.25 % IJ SOLN
INTRAMUSCULAR | Status: AC
  Filled 2022-08-16: qty 30

## 2022-08-16 MED ORDER — ROCURONIUM BROMIDE 10 MG/ML (PF) SYRINGE
PREFILLED_SYRINGE | INTRAVENOUS | Status: AC
  Filled 2022-08-16: qty 40

## 2022-08-16 MED ORDER — PROPOFOL 10 MG/ML IV BOLUS
INTRAVENOUS | Status: AC
  Filled 2022-08-16: qty 20

## 2022-08-16 MED ORDER — LIDOCAINE 2% (20 MG/ML) 5 ML SYRINGE
INTRAMUSCULAR | Status: AC
  Filled 2022-08-16: qty 5

## 2022-08-16 MED ORDER — ACETAMINOPHEN 10 MG/ML IV SOLN
INTRAVENOUS | Status: DC | PRN
  Administered 2022-08-16: 1000 mg via INTRAVENOUS

## 2022-08-16 MED ORDER — PHENYLEPHRINE 80 MCG/ML (10ML) SYRINGE FOR IV PUSH (FOR BLOOD PRESSURE SUPPORT)
PREFILLED_SYRINGE | INTRAVENOUS | Status: AC
  Filled 2022-08-16: qty 30

## 2022-08-16 MED ORDER — DEXAMETHASONE SODIUM PHOSPHATE 10 MG/ML IJ SOLN
INTRAMUSCULAR | Status: AC
  Filled 2022-08-16: qty 4

## 2022-08-16 MED ORDER — ONDANSETRON HCL 4 MG/2ML IJ SOLN
INTRAMUSCULAR | Status: AC
  Filled 2022-08-16: qty 2

## 2022-08-16 MED ORDER — HYDROMORPHONE HCL 1 MG/ML IJ SOLN
INTRAMUSCULAR | Status: AC
  Administered 2022-08-16: 0.5 mg via INTRAVENOUS
  Filled 2022-08-16: qty 1

## 2022-08-16 MED ORDER — VANCOMYCIN HCL 1000 MG IV SOLR
INTRAVENOUS | Status: AC
  Filled 2022-08-16: qty 20

## 2022-08-16 MED ORDER — PROPOFOL 10 MG/ML IV BOLUS
INTRAVENOUS | Status: DC | PRN
  Administered 2022-08-16: 20 mg via INTRAVENOUS
  Administered 2022-08-16: 120 mg via INTRAVENOUS

## 2022-08-16 MED ORDER — VANCOMYCIN HCL 1000 MG IV SOLR
INTRAVENOUS | Status: DC | PRN
  Administered 2022-08-16: 1000 mg via TOPICAL

## 2022-08-16 MED ORDER — ONDANSETRON HCL 4 MG/2ML IJ SOLN
INTRAMUSCULAR | Status: AC
  Filled 2022-08-16: qty 6

## 2022-08-16 MED ORDER — ORAL CARE MOUTH RINSE: 15.0000 mL | Freq: Once | OROMUCOSAL | Status: AC

## 2022-08-16 MED ORDER — SUCCINYLCHOLINE CHLORIDE 200 MG/10ML IV SOSY
PREFILLED_SYRINGE | INTRAVENOUS | Status: AC
  Filled 2022-08-16: qty 20

## 2022-08-16 MED ORDER — HYDROMORPHONE HCL 1 MG/ML IJ SOLN
0.2500 mg | INTRAMUSCULAR | Status: DC | PRN
  Administered 2022-08-16: 0.5 mg via INTRAVENOUS

## 2022-08-16 MED ORDER — ESMOLOL HCL 100 MG/10ML IV SOLN
INTRAVENOUS | Status: AC
  Filled 2022-08-16: qty 10

## 2022-08-16 MED ORDER — ACETAMINOPHEN 325 MG PO TABS: 325.0000 mg | ORAL_TABLET | Freq: Four times a day (QID) | ORAL | Status: DC | PRN

## 2022-08-16 MED ORDER — MORPHINE SULFATE (PF) 2 MG/ML IV SOLN
0.2500 mg | INTRAVENOUS | Status: DC | PRN
  Administered 2022-08-16: 0.25 mg via INTRAVENOUS
  Administered 2022-08-17: 0.5 mg via INTRAVENOUS
  Filled 2022-08-16 (×3): qty 1

## 2022-08-16 MED ORDER — MENTHOL 3 MG MT LOZG: 1.0000 | LOZENGE | OROMUCOSAL | Status: DC | PRN

## 2022-08-16 MED ORDER — SUGAMMADEX SODIUM 200 MG/2ML IV SOLN
INTRAVENOUS | Status: DC | PRN
  Administered 2022-08-16: 200 mg via INTRAVENOUS

## 2022-08-16 MED ORDER — CHLORHEXIDINE GLUCONATE 0.12 % MT SOLN: 15.0000 mL | Freq: Once | OROMUCOSAL | Status: AC

## 2022-08-16 MED ORDER — METOPROLOL TARTRATE 5 MG/5ML IV SOLN
INTRAVENOUS | Status: DC | PRN
  Administered 2022-08-16 (×2): 2 mg via INTRAVENOUS

## 2022-08-16 MED ORDER — BUPIVACAINE HCL (PF) 0.25 % IJ SOLN
INTRAMUSCULAR | Status: DC | PRN
  Administered 2022-08-16: 30 mL

## 2022-08-16 SURGICAL SUPPLY — 128 items
ADH SKN CLS APL DERMABOND .7 (GAUZE/BANDAGES/DRESSINGS) ×1
ANCH SUT 2 SWLK 19.1 CLS EYLT (Anchor) ×2 IMPLANT
ANCH SUT CRKSW FT 1.3X (Anchor) ×2 IMPLANT
ANCH SUT SWLK 19.1X6.25 CLS (Anchor) ×1 IMPLANT
ANCHOR SUT BIOCOMP CORKSREW (Anchor) IMPLANT
ANCHOR SUT SWIVELLOK BIO (Anchor) IMPLANT
ANCHOR SWIVELOCK BIO 4.75X19.1 (Anchor) IMPLANT
APL PRP STRL LF DISP 70% ISPRP (MISCELLANEOUS) ×2
APL SKNCLS STERI-STRIP NONHPOA (GAUZE/BANDAGES/DRESSINGS) ×1
BAG COUNTER SPONGE SURGICOUNT (BAG) ×1 IMPLANT
BAG SPNG CNTER NS LX DISP (BAG) ×1
BANDAGE ESMARK 6X9 LF (GAUZE/BANDAGES/DRESSINGS) ×2 IMPLANT
BENZOIN TINCTURE PRP APPL 2/3 (GAUZE/BANDAGES/DRESSINGS) ×1 IMPLANT
BLADE CLIPPER SURG (BLADE) IMPLANT
BLADE EXCALIBUR 4.0X13 (MISCELLANEOUS) ×1 IMPLANT
BLADE SAG 18X100X1.27 (BLADE) ×2 IMPLANT
BLADE SAW RECIP 87.9 MT (BLADE) IMPLANT
BLADE SAW SAG 35X64 .89 (BLADE) ×1 IMPLANT
BLADE SAW SAG HD 89.5X25X.94 (BLADE) ×1 IMPLANT
BNDG CMPR 9X6 STRL LF SNTH (GAUZE/BANDAGES/DRESSINGS) ×2
BNDG CMPR MED 15X6 ELC VLCR LF (GAUZE/BANDAGES/DRESSINGS) ×1
BNDG ELASTIC 6X15 VLCR STRL LF (GAUZE/BANDAGES/DRESSINGS) IMPLANT
BNDG ELASTIC 6X5.8 VLCR STR LF (GAUZE/BANDAGES/DRESSINGS) ×2 IMPLANT
BNDG ESMARK 6X9 LF (GAUZE/BANDAGES/DRESSINGS) ×2
BOOTCOVER CLEANROOM LRG (PROTECTIVE WEAR) ×2 IMPLANT
BOWL SMART MIX CTS (DISPOSABLE) IMPLANT
CHLORAPREP W/TINT 26 (MISCELLANEOUS) ×2 IMPLANT
CLSR STERI-STRIP ANTIMIC 1/2X4 (GAUZE/BANDAGES/DRESSINGS) ×1 IMPLANT
COVER SURGICAL LIGHT HANDLE (MISCELLANEOUS) ×2 IMPLANT
CUFF TOURN SGL QUICK 34 (TOURNIQUET CUFF) ×1
CUFF TRNQT CYL 34X4.125X (TOURNIQUET CUFF) ×1 IMPLANT
DERMABOND ADVANCED .7 DNX12 (GAUZE/BANDAGES/DRESSINGS) ×1 IMPLANT
DRAPE ARTHROSCOPY W/POUCH 114 (DRAPES) ×1 IMPLANT
DRAPE EXTREMITY T 121X128X90 (DISPOSABLE) ×1 IMPLANT
DRAPE HALF SHEET 40X57 (DRAPES) ×3 IMPLANT
DRAPE IMP U-DRAPE 54X76 (DRAPES) ×1 IMPLANT
DRAPE INCISE IOBAN 66X45 STRL (DRAPES) IMPLANT
DRAPE INCISE IOBAN 85X60 (DRAPES) ×1 IMPLANT
DRAPE SHEET LG 3/4 BI-LAMINATE (DRAPES) ×1 IMPLANT
DRAPE U-SHAPE 47X51 STRL (DRAPES) ×2 IMPLANT
DRESSING AQUACEL AG SP 3.5X10 (GAUZE/BANDAGES/DRESSINGS) ×1 IMPLANT
DRESSING AQUACEL AG SP 3.5X6 (GAUZE/BANDAGES/DRESSINGS) ×1 IMPLANT
DRESSING PEEL AND PLAC PRVNA20 (GAUZE/BANDAGES/DRESSINGS) IMPLANT
DRSG AQUACEL AG SP 3.5X10 (GAUZE/BANDAGES/DRESSINGS) ×1
DRSG AQUACEL AG SP 3.5X6 (GAUZE/BANDAGES/DRESSINGS) ×1
DRSG PEEL AND PLACE PREVENA 20 (GAUZE/BANDAGES/DRESSINGS) ×1
DURAPREP 26ML APPLICATOR (WOUND CARE) ×1 IMPLANT
ELECT BLADE 4.0 EZ CLEAN MEGAD (MISCELLANEOUS) ×1
ELECT CAUTERY BLADE 6.4 (BLADE) ×1 IMPLANT
ELECT REM PT RETURN 9FT ADLT (ELECTROSURGICAL) ×1
ELECTRODE BLDE 4.0 EZ CLN MEGD (MISCELLANEOUS) ×1 IMPLANT
ELECTRODE REM PT RTRN 9FT ADLT (ELECTROSURGICAL) ×1 IMPLANT
EVACUATOR 1/8 PVC DRAIN (DRAIN) IMPLANT
FACESHIELD WRAPAROUND (MASK) ×1 IMPLANT
FACESHIELD WRAPAROUND OR TEAM (MASK) ×1 IMPLANT
GAUZE PAD ABD 8X10 STRL (GAUZE/BANDAGES/DRESSINGS) ×1 IMPLANT
GAUZE SPONGE 4X4 12PLY STRL (GAUZE/BANDAGES/DRESSINGS) ×1 IMPLANT
GAUZE XEROFORM 1X8 LF (GAUZE/BANDAGES/DRESSINGS) ×1 IMPLANT
GLOVE BIO SURGEON STRL SZ7 (GLOVE) ×2 IMPLANT
GLOVE BIO SURGEON STRL SZ7.5 (GLOVE) ×3 IMPLANT
GLOVE BIOGEL PI IND STRL 7.0 (GLOVE) ×1 IMPLANT
GLOVE BIOGEL PI IND STRL 7.5 (GLOVE) ×2 IMPLANT
GLOVE BIOGEL PI IND STRL 8 (GLOVE) ×2 IMPLANT
GLOVE ORTHO TXT STRL SZ7.5 (GLOVE) ×1 IMPLANT
GLOVE SURG ENC MOIS LTX SZ8 (GLOVE) ×2 IMPLANT
GLOVE SURG SYN 7.5  E (GLOVE) ×1
GLOVE SURG SYN 7.5 E (GLOVE) ×1 IMPLANT
GLOVE SURG SYN 7.5 PF PI (GLOVE) ×1 IMPLANT
GOWN STRL REUS W/ TWL LRG LVL3 (GOWN DISPOSABLE) ×2 IMPLANT
GOWN STRL REUS W/ TWL XL LVL3 (GOWN DISPOSABLE) ×2 IMPLANT
GOWN STRL REUS W/TWL LRG LVL3 (GOWN DISPOSABLE) ×2
GOWN STRL REUS W/TWL XL LVL3 (GOWN DISPOSABLE) ×3 IMPLANT
HANDPIECE INTERPULSE COAX TIP (DISPOSABLE) ×2
HOOD PEEL AWAY FACE SHEILD DIS (HOOD) ×2 IMPLANT
HOOD PEEL AWAY FLYTE STAYCOOL (MISCELLANEOUS) ×3 IMPLANT
KIT BASIN OR (CUSTOM PROCEDURE TRAY) ×1 IMPLANT
KIT TURNOVER KIT B (KITS) ×1 IMPLANT
LINER TIB PS CD/8-9 10 LT (Liner) IMPLANT
MANIFOLD NEPTUNE II (INSTRUMENTS) ×2 IMPLANT
MARKER SKIN DUAL TIP RULER LAB (MISCELLANEOUS) ×2 IMPLANT
NDL 18GX1X1/2 (RX/OR ONLY) (NEEDLE) ×1 IMPLANT
NEEDLE 18GX1X1/2 (RX/OR ONLY) (NEEDLE) ×1 IMPLANT
NS IRRIG 1000ML POUR BTL (IV SOLUTION) ×2 IMPLANT
PACK ARTHROSCOPY DSU (CUSTOM PROCEDURE TRAY) ×1 IMPLANT
PACK TOTAL JOINT (CUSTOM PROCEDURE TRAY) ×2 IMPLANT
PACK UNIVERSAL I (CUSTOM PROCEDURE TRAY) ×1 IMPLANT
PAD ARMBOARD 7.5X6 YLW CONV (MISCELLANEOUS) ×2 IMPLANT
PAD CAST 4YDX4 CTTN HI CHSV (CAST SUPPLIES) ×1 IMPLANT
PADDING CAST ABS COTTON 4X4 ST (CAST SUPPLIES) IMPLANT
PADDING CAST COTTON 4X4 STRL (CAST SUPPLIES) ×1
PADDING CAST COTTON 6X4 STRL (CAST SUPPLIES) ×1 IMPLANT
PADDING CAST SYNTHETIC 6X4 NS (CAST SUPPLIES) IMPLANT
PROTECTOR NERVE ULNAR (MISCELLANEOUS) ×1 IMPLANT
SET HNDPC FAN SPRY TIP SCT (DISPOSABLE) ×2 IMPLANT
SPONGE T-LAP 18X18 ~~LOC~~+RFID (SPONGE) ×3 IMPLANT
SPONGE T-LAP 4X18 ~~LOC~~+RFID (SPONGE) ×1 IMPLANT
STAPLER VISISTAT 35W (STAPLE) IMPLANT
SUCTION FRAZIER HANDLE 10FR (MISCELLANEOUS) ×1
SUCTION TUBE FRAZIER 10FR DISP (MISCELLANEOUS) ×1 IMPLANT
SUT 2 FIBERLOOP 20 STRT BLUE (SUTURE) ×1
SUT ETHILON 2 0 FS 18 (SUTURE) IMPLANT
SUT ETHILON 2 LR (SUTURE) IMPLANT
SUT MNCRL AB 3-0 PS2 18 (SUTURE) ×1 IMPLANT
SUT STRATAFIX 1PDS 45CM VIOLET (SUTURE) ×2 IMPLANT
SUT VIC AB 0 CT1 27 (SUTURE) ×2
SUT VIC AB 0 CT1 27XBRD ANBCTR (SUTURE) ×2 IMPLANT
SUT VIC AB 1 CT1 27 (SUTURE) ×1
SUT VIC AB 1 CT1 27XBRD ANBCTR (SUTURE) ×1 IMPLANT
SUT VIC AB 1 CTX 18 (SUTURE) IMPLANT
SUT VIC AB 2-0 CT1 27 (SUTURE) ×3
SUT VIC AB 2-0 CT1 TAPERPNT 27 (SUTURE) ×3 IMPLANT
SUT VIC AB 3-0 SH 18 (SUTURE) ×1 IMPLANT
SUTURE 2 FIBERLOOP 20 STRT BLU (SUTURE) IMPLANT
SUTURE TAPE 1.3 FIBERLOP 20 ST (SUTURE) IMPLANT
SUTURETAPE 1.3 FIBERLOOP 20 ST (SUTURE) ×3
SYR 30ML LL (SYRINGE) ×3 IMPLANT
SYR 50ML LL SCALE MARK (SYRINGE) ×1 IMPLANT
SYS INTERNAL BRACE KNEE (Miscellaneous) ×1 IMPLANT
SYSTEM INTERNAL BRACE KNEE (Miscellaneous) IMPLANT
TOWEL GREEN STERILE (TOWEL DISPOSABLE) ×1 IMPLANT
TOWEL GREEN STERILE FF (TOWEL DISPOSABLE) ×1 IMPLANT
TRAY FOLEY MTR SLVR 16FR STAT (SET/KITS/TRAYS/PACK) IMPLANT
TUBE CONNECTING 12X1/4 (SUCTIONS) ×1 IMPLANT
TUBE SUCT ARGYLE STRL (TUBING) ×1 IMPLANT
TUBE SUCTION HIGH CAP CLEAR NV (SUCTIONS) ×1 IMPLANT
TUBING ARTHROSCOPY IRRIG 16FT (MISCELLANEOUS) ×1 IMPLANT
WATER STERILE IRR 1000ML POUR (IV SOLUTION) ×3 IMPLANT
WRAP KNEE MAXI GEL POST OP (GAUZE/BANDAGES/DRESSINGS) ×1 IMPLANT

## 2022-08-16 NOTE — Progress Notes (Signed)
1730 : Dr. Tawanna Solo Notified of patient arriving back from surgery of Cardizem drip  1740: MD notified that patient had possible aspiration with Dinner. Pt 02 saturations 88 on 4 L. Previously 96 on 3 L nasal canula. MD ordered high flow.   Pt on high flow saturations 93% on 10 liters. Patient not in distress.  Respiratory therapy contacted.

## 2022-08-16 NOTE — Op Note (Signed)
Orthopaedic Surgery Operative Note (CSN: [id number])  [name]  [birth year] Date of Surgery: 08/16/2022   Diagnoses:  Status post left total knee arthroplasty with fall and extensor mechanism rupture  Procedure: Left revision total knee arthroplasty 1 component Left patellar tendon repair with graft Left medial retinacular repair Left lateral retinacular repair   Operative Finding Successful completion of the planned procedure.  Patient had a complete avulsion of her extensor mechanism with a Z-type structure of her patellar avulsion.  There is an undersurface component of the patellar tendon that was avulsed as well.  We were able to mobilize the patella tendon and eventually able to get it back down to bone.  We performed a 1 component revision taking the polyethylene out and performing extensive debridement and washout of the joint.  3 specimens were taken for culture.  We repaired the patellar tendon primarily but then harvested the hamstrings and use these as a autograft anchor into the tibia and weaving through the quad tendon/patella interface.  Bone quality was poor which limits our ability to reconstruct this.  If the patient fails and has appropriate skin coverage may need a complete extensor mechanism replacement.  Patient has significant risk factors for continued issues including soft tissue coverage, infection and overall debility.  Dr. Zachery Dakins will continue to be the primary surgeon however this complex case required to fellowship trained surgeons with different specialties to handle both the arthroplasty portion as well as the reconstruction of the extensor mechanism.  Post-operative plan: The patient will be touchdown weightbearing with the knee locked in extension for 6 to 12 weeks depending on condition.  The patient will be readmitted to the floor.  DVT prophylaxis Lovenox 40 mg/day until mobilizing and then consider transition in clinic to alternative medicines.   Pain  control with PRN pain medication preferring oral medicines.  Follow up plan will be scheduled in approximately 7 days for incision check.  Post-Op Diagnosis: Same Surgeons:Primary: Hiram Gash, MD Assisting: [name], MD Assistants:Caroline McBane PA-C Location: [facility] OR ROOM 09 Anesthesia: General with local anesthesia Antibiotics: Ancef 2 g with local vancomycin powder 1 g at the surgical site Tourniquet time:  Total Tourniquet Time Documented: Thigh (Left) - 99 minutes Total: Thigh (Left) - 99 minutes  Estimated Blood Loss: Minimal Complications: None Specimens: 3 for culture Implants: Implant Name Type Inv. Item Serial No. Manufacturer Lot No. LRB No. Used Action  LINER TIB PS CD/8-9 10 LT - [id number] Liner LINER TIB PS CD/8-9 10 LT  ZIMMER RECON(ORTH,TRAU,BIO,SG) [id number] Left 1 Implanted  ANCHOR SUT [facility] - [id number] Anchor ANCHOR Lottie Rater  [employer] [id number] Left 1 Implanted  ANCHOR SUT BIOCOMP CORKSREW - [id number] Anchor ANCHOR Lottie Rater  [employer] [id number] Left 1 Implanted  SYS INTERNAL BRACE KNEE - [id number] Miscellaneous SYS INTERNAL BRACE KNEE  [employer] [id number] Left 1 Implanted  [employer] BIO 4.75X19.1 - [id number] Anchor ANCHOR SWIVELOCK BIO 4.75X19.1  [employer] [id number] Left 1 Implanted  ANCHOR SUT SW[employer]LLOK BIO - [id number] Anchor ANCHOR Sheryle [employer] [id number] Left 1 Implanted  [facility] SWIVELOCK BIO 4.75X19.1 - [id number] Anchor ANCHOR SWIVELOCK BIO 2.62M35.5  Rolinda Roan [id number] Left 1 Implanted    Indications for Surgery:   [name] is a 76 y.o. female with fall after total knee arthroplasty resulting in extensor mechanism rupture.  Benefits and risks of operative and nonoperative management were discussed prior to surgery with patient/guardian(s) and informed consent form  was completed.  Specific risks including infection, need for additional surgery, continued issues with extensor  mechanism, infection, wound breakdown, stiffness and pain amongst others.   Procedure:   The patient was identified properly. Informed consent was obtained and the surgical site was marked. The patient was taken up to suite where general anesthesia was induced.  The patient was positioned supine on a regular bed.  The left knee was prepped and draped in the usual sterile fashion.  Timeout was performed before the beginning of the case.  Tourniquet was used for the above duration.  Began by using the previously made incision medial parapatellar.  Went through skin sharp achieving hemostasis progressed.  We identified that the medial lateral retinaculum were completely avulsed and the patellar tendon was avulsed.  There is a Z-type configuration of the patellar tendon rupture with an undersurface and medial component that was still attached to the tibia however the primary bulk of the patellar tendon was still attached to the patella and avulsed off the tibial tubercle.  We open the fascial planes made full-thickness skin flaps.  We noted that the MCL was intact.  We then explanted the existing polyethylene took multiple samples and irrigated with 3 L normal saline.  Once this was complete there is no sign of debris or contaminant we replaced a new polyethylene component of the same size.  The tibial and femoral components were well fixed and left alone.  Dr. Zachery Dakins was the lead surgeon for this portion of the case yet I was assisting, at that point I took over as the main surgeon for the patella tendon portion.  I began by clearing the undersurface of the patella and placing a 4.75 bio composite swivel lock with a fiber tape.  We tied the portion of the patellar tendon that was still attached to the tibial tubercle using a running locking Krakw suture configuration and pulling on the alternating limb to cinch the tissue to the bone.  Bone quality was relatively poor throughout this case.  We placed the  fiber tape away for eventual repair.  We then placed two 5.5 mm bio composite corkscrew's in the tibial tubercle in standard fashion.  We had reasonable purchase.  We passed each of the 4 sutures in a running locking Krakw fashion and were able to pass the alternate limb cinching the alternate line pulling the tendon to bone.  We held the knee in hyperextension to try and achieve this.  There was overall a 3 to 5 degree flexion contracture that the patient was more comfortable in however with manipulation were able to get her to near full extension.  At this point we felt that our sutures were appropriately passed.  We harvested the semitendinosis and gracilis trying to keep the tendons attached on the tibial tubercle.  Unfortunately there was a fair amount of damage at the insertion of the hamstrings and they pulled loose.  We whipstitched both ends of these tendons and weaved them at the quad/patella junction so that they were interface at the bone tendon junction.  We then brought each limb down to a 4.75 bio composite swivel lock obtaining a reasonable purchase.  We oversewed the ends of the tendon grafts into place.  We stick tied the graft to the quad and patella with #1 Vicryl.  Once this was complete we closed the medial and lateral retinaculum with interrupted sutures as well as a strata fix suture device.  We irrigated again and placed local vancomycin  powder.  The knee was held in hyperextension throughout.  The skin was quite tense and even with full-thickness skin flaps were required trauma stitches as well as 3-0 nylon's to bring the tissue together reasonably.  We placed a Prevena wound VAC and a sterile dressing.  At that point we placed a 3 sided long-leg splint freeing the ankle.  Once the splint was hardened we felt appropriate for the patient to wake from anesthesia and was taken to PACU in stable condition.   This was a complex case requiring multiple fellowship trained surgeons, it was  imperative that Dr. Charlies Constable and Ophelia Charter MD were present for the case.

## 2022-08-16 NOTE — Transfer of Care (Signed)
Immediate Anesthesia Transfer of Care Note  Patient: [name]  Procedure(s) Performed: IRRIGATION AND DEBRIDEMENT LEFT KNEE REVISION WITH PATELLAR TENDON REPAIR and poly exchange (Left: Knee) KNEE POLY EXCHANGE (Left: Knee)  Patient Location: PACU  Anesthesia Type:General  Level of Consciousness: awake and alert   Airway & Oxygen Therapy: Patient Spontanous Breathing and Patient connected to face mask oxygen  Post-op Assessment: Report given to RN and Post -op Vital signs reviewed and stable  Post vital signs: Reviewed and stable  Last Vitals:  Vitals Value Taken Time  BP 132/93 08/16/22 1536  Temp    Pulse 121 08/16/22 1540  Resp 14 08/16/22 1540  SpO2 94 % 08/16/22 1540  Vitals shown include unvalidated device data.  Last Pain:  Vitals:   08/16/22 1113  TempSrc:   PainSc: 0-No pain         Complications: No notable events documented.

## 2022-08-16 NOTE — Progress Notes (Signed)
PROGRESS NOTE  [name]  FUX:[id number] DOB: 01-15-1946 DOA: 08/14/2022 PCP: Asencion Noble, MD   Brief Narrative:  Patient is a 76 female with history of chronic pain syndrome, stroke, hyperlipidemia, stress incontinence, asthma, GERD, A-fib, DVT, depression, anxiety who presented from orthopedics office for management of left knee fracture.  She had a left total knee replacement on September 11 and was recovering at rehab facility but had a fall while at rehab facility and fractured her patella and her knee .  She was recently diagnosed with COVID on September 25 and was weak and lightheaded and fell.  No evidence of pneumonia as per chest x-ray that was done as an outpatient, she completed outpatient antiviral medication.  Orthopedics following  Assessment & Plan:  Principal Problem:   Periprosthetic fracture around internal prosthetic left knee joint Active Problems:   Neck pain, chronic   History of CVA (cerebrovascular accident)   Hyperlipidemia   Asthma   Esophageal reflux   Paroxysmal atrial fibrillation (HCC)   History of DVT of lower extremity   SARS-CoV-2 positive   Neurocognitive deficits  Prosthetic fracture of left knee joint: Fell at [facility].  Status post left total knee replacement on September 11.  Orthopedics following.  Further management as per orthopedics.  Suspected cellulitis: Left lower extremity also appeared erythematous, warm on admission.  No fever.  Started on ceftriaxone with significant  improvement  COVID infection: Tested  positive on September 25.  Completed course of antiviral.  On room air.  Lifted precautions  History of CVA/hyperlipidemia: On aspirin, Eliquis at home, currently on hold for anticipation of surgery  Stress incontinence: Continue home Myrbetriq  Paroxysmal A-fib: On Eliquis at home, currently on hold for possible surgery .Not on rate control meds  Neurocognitive deficits: Continue supportive care , delirium  precautions  Depression/anxiety/insomnia: Continue home BuSpar, duloxetine  Bilateral lower extremity edema: Unclear reason.  BNP elevated.  Echo showed EF of 55 to 60%, indeterminate diastolic parameters.Significantly improved with lasix          DVT prophylaxis:SCDs Start: 08/14/22 1906     Code Status: Full Code  Family Communication: None at bedside  Patient status:Inpatient  Patient is from :Home  Anticipated discharge to:[facility]  Estimated DC date:not sure   Consultants: Orthopedics  Procedures:None yet  Antimicrobials:  Anti-infectives (From admission, onward)    Start     Dose/Rate Route Frequency Ordered Stop   08/16/22 0745  ceFAZolin (ANCEF) IVPB 2g/100 mL premix        2 g 200 mL/hr over 30 Minutes Intravenous On call to O.R. 08/16/22 0254 08/17/22 0559   08/14/22 2100  [MAR Hold]  cefTRIAXone (ROCEPHIN) 1 g in sodium chloride 0.9 % 100 mL IVPB        (MAR Hold since Fri 08/16/2022 at 1052.Hold Reason: Transfer to a Procedural area)   1 g 200 mL/hr over 30 Minutes Intravenous Every 24 hours 08/14/22 2011 08/21/22 2059       Subjective: Patient seen and examined at the bedside today.  Hemodynamically stable.  Her bilateral lower extremity edema looks significantly improved.  Cellulitic area on the left knee has significantly improved as well.  Objective: Vitals:   08/15/22 2126 08/16/22 0554 08/16/22 1029 08/16/22 1054  BP: (!) 113/52 (!) 113/52 129/66 103/68  Pulse: 100 100 (!) 108   Resp: '20 20 18 18  '$ Temp: 97.9 F (36.6 C) 97.9 F (36.6 C) 97.9 F (36.6 C) 98.3 F (36.8 C)  TempSrc:  Oral  Oral Oral  SpO2:   94% 95%  Weight:  86.2 kg  86.2 kg  Height:  '5\' 5"'$  (1.651 m)  '5\' 5"'$  (1.651 m)    Intake/Output Summary (Last 24 hours) at 08/16/2022 1259 Last data filed at 08/16/2022 0500 Gross per 24 hour  Intake 3 ml  Output 2200 ml  Net -2197 ml   Filed Weights   08/16/22 0554 08/16/22 1054  Weight: 86.2 kg 86.2 kg    Examination:  General  exam: Overall comfortable, not in distress,obese HEENT: PERRL Respiratory system:  no wheezes or crackles  Cardiovascular system: S1 & S2 heard, RRR.  Gastrointestinal system: Abdomen is nondistended, soft and nontender. Central nervous system: Alert and mostly oriented Extremities: trace lower  edema, no clubbing ,no cyanosis, surgical scar on the left knee with surrounding erythema Skin: No rashes, no ulcers,no icterus     Data Reviewed: I have personally reviewed following labs and imaging studies  CBC: Recent Labs  Lab 08/14/22 2032 08/15/22 0254  WBC 9.3 7.1  HGB 10.5* 10.4*  HCT 31.8* 31.1*  MCV 94.9 94.5  PLT 324 161   Basic Metabolic Panel: Recent Labs  Lab 08/14/22 2032 08/15/22 0254 08/16/22 0241  NA 138 138 139  K 3.6 3.7 3.6  CL 102 101 98  CO2 '26 27 30  '$ GLUCOSE 121* 99 103*  BUN '8 8 9  '$ CREATININE 0.60 0.60 0.69  CALCIUM 9.1 9.0 9.1     Recent Results (from the past 240 hour(s))  Surgical pcr screen     Status: None   Collection Time: 08/16/22  7:00 AM   Specimen: Nasal Mucosa; Nasal Swab  Result Value Ref Range Status   MRSA, PCR NEGATIVE NEGATIVE Final   Staphylococcus aureus NEGATIVE NEGATIVE Final    Comment: (NOTE) The Xpert SA Assay (FDA approved for NASAL specimens in patients 43 years of age and older), is one component of a comprehensive surveillance program. It is not intended to diagnose infection nor to guide or monitor treatment. Performed at [facility], [place] [address], [place], [place] [address]      Radiology Studies: ECHOCARDIOGRAM COMPLETE  Result Date: 08/16/2022    ECHOCARDIOGRAM REPORT   Patient Name:   [name] Date of Exam: 08/15/2022 Medical Rec #:  [id number]     Height:       65.0 in Accession #:    [id number]    Weight:       206.9 lb Date of Birth:  11-28-1945    BSA:          2.007 m Patient Age:    76 years      BP:           121/82 mmHg Patient Gender: F             HR:           115 bpm. Exam Location:   Inpatient Procedure: 2D Echo, Color Doppler and Cardiac Doppler Indications:    CHF  History:        Patient has no prior history of Echocardiogram examinations.                 Stroke; Signs/Symptoms:Murmur.  Sonographer:    Memory Argue Referring Phys: [id number] Latangela Mccomas  Sonographer Comments: Technically difficult study due to poor echo windows. IMPRESSIONS  1. Poor quality exam. Consider repeat exam when HR is better controlled.  2. Left ventricular ejection fraction, by estimation, is 55 to  60%. The left ventricle has normal function. Left ventricular endocardial border not optimally defined to evaluate regional wall motion. Left ventricular diastolic parameters are indeterminate. Definity contrast not used.  3. Right ventricular systolic function is normal. The right ventricular size is normal. There is normal pulmonary artery systolic pressure.  4. Left atrial size was moderately dilated.  5. The mitral valve is grossly normal. Trivial mitral valve regurgitation. No evidence of mitral stenosis.  6. The aortic valve was not well visualized. Aortic valve regurgitation is not visualized. Aortic valve sclerosis is present, with no evidence of aortic valve stenosis.  7. The inferior vena cava is normal in size with <50% respiratory variability, suggesting right atrial pressure of 8 mmHg. FINDINGS  Left Ventricle: Left ventricular ejection fraction, by estimation, is 55 to 60%. The left ventricle has normal function. Left ventricular endocardial border not optimally defined to evaluate regional wall motion. The left ventricular internal cavity size was normal in size. There is no left ventricular hypertrophy. Left ventricular diastolic parameters are indeterminate. Right Ventricle: The right ventricular size is normal. No increase in right ventricular wall thickness. Right ventricular systolic function is normal. There is normal pulmonary artery systolic pressure. The tricuspid regurgitant velocity is 2.19  m/s, and  with an assumed right atrial pressure of 8 mmHg, the estimated right ventricular systolic pressure is 31.4 mmHg. Left Atrium: Left atrial size was moderately dilated. Right Atrium: Right atrial size was normal in size. Pericardium: There is no evidence of pericardial effusion. Mitral Valve: The mitral valve is grossly normal. Trivial mitral valve regurgitation. No evidence of mitral valve stenosis. Tricuspid Valve: The tricuspid valve is not well visualized. Tricuspid valve regurgitation is trivial. No evidence of tricuspid stenosis. Aortic Valve: The aortic valve was not well visualized. Aortic valve regurgitation is not visualized. Aortic valve sclerosis is present, with no evidence of aortic valve stenosis. Aortic valve mean gradient measures 7.0 mmHg. Aortic valve peak gradient measures 13.1 mmHg. Aortic valve area, by VTI measures 2.00 cm. Pulmonic Valve: The pulmonic valve was not assessed. Pulmonic valve regurgitation is not visualized. No evidence of pulmonic stenosis. Aorta: The ascending aorta was not well visualized. Venous: The inferior vena cava is normal in size with less than 50% respiratory variability, suggesting right atrial pressure of 8 mmHg. IAS/Shunts: No atrial level shunt detected by color flow Doppler.  LEFT VENTRICLE PLAX 2D LVIDd:         4.00 cm LVIDs:         2.60 cm LV PW:         1.00 cm LV IVS:        1.00 cm LVOT diam:     2.00 cm LV SV:         55 LV SV Index:   28 LVOT Area:     3.14 cm  RIGHT VENTRICLE TAPSE (M-mode): 2.0 cm LEFT ATRIUM             Index        RIGHT ATRIUM           Index LA diam:        3.20 cm 1.59 cm/m   RA Area:     15.10 cm LA Vol (A2C):   80.8 ml 40.24 ml/m  RA Volume:   38.50 ml  19.18 ml/m LA Vol (A4C):   95.9 ml 47.79 ml/m LA Biplane Vol: 95.6 ml 47.64 ml/m  AORTIC VALVE AV Area (Vmax):    1.89 cm AV Area (Vmean):  1.92 cm AV Area (VTI):     2.00 cm AV Vmax:           181.00 cm/s AV Vmean:          124.000 cm/s AV VTI:             0.277 m AV Peak Grad:      13.1 mmHg AV Mean Grad:      7.0 mmHg LVOT Vmax:         109.00 cm/s LVOT Vmean:        75.800 cm/s LVOT VTI:          0.176 m LVOT/AV VTI ratio: 0.64  AORTA Ao Root diam: 3.60 cm MR Peak grad: 37.5 mmHg   TRICUSPID VALVE MR Vmax:      306.00 cm/s TR Peak grad:   19.2 mmHg                           TR Vmax:        219.00 cm/s                            SHUNTS                           Systemic VTI:  0.18 m                           Systemic Diam: 2.00 cm Cherlynn Kaiser MD Electronically signed by Cherlynn Kaiser MD Signature Date/Time: 08/16/2022/6:03:32 AM    Final     Scheduled Meds:  [MAR Hold] busPIRone  30 mg Oral BID   chlorhexidine  60 mL Topical Once   [MAR Hold] DULoxetine  120 mg Oral QHS   furosemide  40 mg Intravenous Once   [MAR Hold] mirabegron ER  50 mg Oral Daily   [MAR Hold] sodium chloride flush  3 mL Intravenous Q12H   Continuous Infusions:   ceFAZolin (ANCEF) IV     [MAR Hold] cefTRIAXone (ROCEPHIN)  IV 1 g (08/15/22 2101)   lactated ringers 10 mL/hr at 08/16/22 1232   tranexamic acid       LOS: 2 days   Shelly Coss, MD [facility] P10/04/2022, 12:59 PM

## 2022-08-16 NOTE — Progress Notes (Signed)
Pt resting comfortably in bed. BS diminished Pt RR 18 SATs 99 on 10L HFNC. RT reduced O2 to 5L HFNC sats 97. RT informed RN about O2 titration. No respiratory distress noted.

## 2022-08-16 NOTE — Plan of Care (Signed)
  Problem: Education: Goal: Knowledge of General Education information will improve Description: Including pain rating scale, medication(s)/side effects and non-pharmacologic comfort measures Outcome: Progressing   Problem: Health Behavior/Discharge Planning: Goal: Ability to manage health-related needs will improve Outcome: Progressing   Problem: Clinical Measurements: Goal: Ability to maintain clinical measurements within normal limits will improve Outcome: Progressing   Problem: Nutrition: Goal: Adequate nutrition will be maintained Outcome: Progressing   Problem: Pain Managment: Goal: General experience of comfort will improve Outcome: Progressing   Problem: Safety: Goal: Ability to remain free from injury will improve Outcome: Progressing   Problem: Skin Integrity: Goal: Risk for impaired skin integrity will decrease Outcome: Progressing   Problem: Activity: Goal: Ability to avoid complications of mobility impairment will improve Outcome: Progressing

## 2022-08-16 NOTE — Anesthesia Procedure Notes (Signed)
Procedure Name: Intubation Date/Time: 08/16/2022 1:30 PM  Performed by: Darletta Moll, CRNAPre-anesthesia Checklist: Patient identified, Emergency Drugs available, Suction available and Patient being monitored Patient Re-evaluated:Patient Re-evaluated prior to induction Oxygen Delivery Method: Circle system utilized Preoxygenation: Pre-oxygenation with 100% oxygen Induction Type: IV induction Ventilation: Mask ventilation without difficulty Laryngoscope Size: Mac and 3 Grade View: Grade I Tube type: Oral Tube size: 7.0 mm Number of attempts: 1 Airway Equipment and Method: Stylet Placement Confirmation: ETT inserted through vocal cords under direct vision, positive ETCO2 and breath sounds checked- equal and bilateral Secured at: 21 cm Tube secured with: Tape Dental Injury: Teeth and Oropharynx as per pre-operative assessment

## 2022-08-16 NOTE — Interval H&P Note (Signed)
All questions answered, patient would like to proceed. I am co-surgeon with Dr. Zachery Dakins on this case due to the complexity.

## 2022-08-16 NOTE — Plan of Care (Signed)

## 2022-08-16 NOTE — Anesthesia Preprocedure Evaluation (Addendum)
Anesthesia Evaluation  Patient identified by MRN, date of birth, ID band Patient awake    Reviewed: Allergy & Precautions, NPO status , Patient's Chart, lab work & pertinent test results  History of Anesthesia Complications Negative for: history of anesthetic complications  Airway Mallampati: II  TM Distance: >3 FB Neck ROM: Full    Dental no notable dental hx.    Pulmonary asthma    Pulmonary exam normal        Cardiovascular + dysrhythmias Atrial Fibrillation  Rhythm:Irregular Rate:Tachycardia  TTE 08/15/22: EF 55-60%,moderate LAE, valves ok      Neuro/Psych  Headaches, Depression CVA, No Residual Symptoms    GI/Hepatic Neg liver ROS,GERD  Controlled,,  Endo/Other  negative endocrine ROS    Renal/GU negative Renal ROS  negative genitourinary   Musculoskeletal  (+) Arthritis ,  POST OP LEFT TOTAL KNEE REPLACEMENT WITH PATELLA TENDON RUPTURE   Abdominal   Peds  Hematology  (+) Blood dyscrasia (Hgb 10.4), anemia   Anesthesia Other Findings Day of surgery medications reviewed with patient.  Reproductive/Obstetrics negative OB ROS                           Anesthesia Physical Anesthesia Plan  ASA: 2  Anesthesia Plan: General   Post-op Pain Management: Tylenol PO (pre-op)*   Induction: Intravenous  PONV Risk Score and Plan: 3 and Treatment may vary due to age or medical condition, Dexamethasone and Ondansetron  Airway Management Planned: Oral ETT  Additional Equipment: None  Intra-op Plan:   Post-operative Plan: Extubation in OR  Informed Consent: I have reviewed the patients History and Physical, chart, labs and discussed the procedure including the risks, benefits and alternatives for the proposed anesthesia with the patient or authorized representative who has indicated his/her understanding and acceptance.     Dental advisory given  Plan Discussed with: CRNA  Anesthesia  Plan Comments:         Anesthesia Quick Evaluation

## 2022-08-17 ENCOUNTER — Encounter (HOSPITAL_COMMUNITY): Payer: Self-pay | Admitting: Orthopaedic Surgery

## 2022-08-17 DIAGNOSIS — M9712XA Periprosthetic fracture around internal prosthetic left knee joint, initial encounter: Secondary | ICD-10-CM | POA: Diagnosis not present

## 2022-08-17 LAB — BASIC METABOLIC PANEL
Anion gap: 14 (ref 5–15)
BUN: 13 mg/dL (ref 8–23)
CO2: 28 mmol/L (ref 22–32)
Calcium: 8.8 mg/dL — ABNORMAL LOW (ref 8.9–10.3)
Chloride: 93 mmol/L — ABNORMAL LOW (ref 98–111)
Creatinine, Ser: 1.32 mg/dL — ABNORMAL HIGH (ref 0.44–1.00)
GFR, Estimated: 42 mL/min — ABNORMAL LOW (ref >60)
Glucose, Bld: 132 mg/dL — ABNORMAL HIGH (ref 70–99)
Potassium: 3.8 mmol/L (ref 3.5–5.1)
Sodium: 135 mmol/L (ref 135–145)

## 2022-08-17 LAB — CBC
HCT: 34.2 % — ABNORMAL LOW (ref 36.0–46.0)
Hemoglobin: 10.8 g/dL — ABNORMAL LOW (ref 12.0–15.0)
MCH: 30.3 pg (ref 26.0–34.0)
MCHC: 31.6 g/dL (ref 30.0–36.0)
MCV: 96.1 fL (ref 80.0–100.0)
Platelets: 387 10*3/uL (ref 150–400)
RBC: 3.56 MIL/uL — ABNORMAL LOW (ref 3.87–5.11)
RDW: 14.2 % (ref 11.5–15.5)
WBC: 16.9 10*3/uL — ABNORMAL HIGH (ref 4.0–10.5)
nRBC: 0 % (ref 0.0–0.2)

## 2022-08-17 MED ORDER — POLYETHYLENE GLYCOL 3350 17 G PO PACK
17.0000 g | PACK | Freq: Every day | ORAL | Status: DC
  Administered 2022-08-17 – 2022-08-23 (×7): 17 g via ORAL
  Filled 2022-08-17 (×7): qty 1

## 2022-08-17 MED ORDER — SODIUM CHLORIDE 0.9 % IV BOLUS: 500.0000 mL | Freq: Once | INTRAVENOUS | Status: DC

## 2022-08-17 MED ORDER — SODIUM CHLORIDE 0.9 % IV SOLN: INTRAVENOUS | Status: DC

## 2022-08-17 MED ORDER — METOPROLOL TARTRATE 25 MG PO TABS
25.0000 mg | ORAL_TABLET | Freq: Two times a day (BID) | ORAL | Status: DC
  Administered 2022-08-17 (×2): 25 mg via ORAL
  Filled 2022-08-17 (×2): qty 1

## 2022-08-17 MED ORDER — APIXABAN 5 MG PO TABS
5.0000 mg | ORAL_TABLET | Freq: Two times a day (BID) | ORAL | Status: DC
  Administered 2022-08-17 – 2022-08-18 (×2): 5 mg via ORAL
  Filled 2022-08-17 (×2): qty 1

## 2022-08-17 MED ORDER — SENNA 8.6 MG PO TABS
1.0000 | ORAL_TABLET | Freq: Two times a day (BID) | ORAL | Status: DC
  Administered 2022-08-17 – 2022-08-23 (×13): 8.6 mg via ORAL
  Filled 2022-08-17 (×13): qty 1

## 2022-08-17 NOTE — Plan of Care (Signed)
  Problem: Education: Goal: Knowledge of General Education information will improve Description: Including pain rating scale, medication(s)/side effects and non-pharmacologic comfort measures Outcome: Progressing   Problem: Health Behavior/Discharge Planning: Goal: Ability to manage health-related needs will improve Outcome: Progressing   Problem: Clinical Measurements: Goal: Ability to maintain clinical measurements within normal limits will improve Outcome: Progressing   Problem: Activity: Goal: Risk for activity intolerance will decrease Outcome: Progressing   Problem: Nutrition: Goal: Adequate nutrition will be maintained Outcome: Progressing   Problem: Coping: Goal: Level of anxiety will decrease Outcome: Progressing   Problem: Pain Managment: Goal: General experience of comfort will improve Outcome: Progressing   Problem: Pain Management: Goal: Pain level will decrease with appropriate interventions Outcome: Progressing

## 2022-08-17 NOTE — Progress Notes (Signed)
Patient is on HFNC 5L with 100 sats. Reduced O2 to 4L. Sats of 98-99 maintained. RR is at 18

## 2022-08-17 NOTE — Progress Notes (Signed)
PROGRESS NOTE  [name]  WER:[id number] DOB: 1946/01/03 DOA: 08/14/2022 PCP: Asencion Noble, MD   Brief Narrative:  Patient is a 89 female with history of chronic pain syndrome, stroke, hyperlipidemia, stress incontinence, asthma, GERD, A-fib, DVT, depression, anxiety who presented from orthopedics office for management of left knee fracture.  She had a left total knee replacement on September 11 and was recovering at rehab facility but had a fall while at rehab facility and fractured her patella and her knee .  She was recently diagnosed with COVID on September 25 and was weak and lightheaded and fell.  No evidence of pneumonia as per chest x-ray that was done as an outpatient, she completed outpatient antiviral medication.  Orthopedics following, status post revision of total left knee arthroplasty, tendon repair with graft on 10/6  Assessment & Plan:  Principal Problem:   Periprosthetic fracture around internal prosthetic left knee joint Active Problems:   Neck pain, chronic   History of CVA (cerebrovascular accident)   Hyperlipidemia   Asthma   Esophageal reflux   Paroxysmal atrial fibrillation (HCC)   History of DVT of lower extremity   SARS-CoV-2 positive   Neurocognitive deficits  Prosthetic fracture of left knee joint: Fell at [facility].  Status post left total knee replacement on September 11.  Orthopedics following. status post revision of total left knee arthroplasty, tendon repair with graft on 10/6. PT/OT recommending SNF on discharge. Postoperatively, she was requiring oxygen for maintenance of saturation.  Currently on room air.  Chest x-ray did not show any significant findings  Suspected cellulitis: Left lower extremity also appeared erythematous, warm on admission.  No fever.  Started on ceftriaxone with significant  improvement.  Developed  leukocytosis today.  Aerobic/anaerobic culture sent on 10/6 is pending without any growth.  Paroxysmal A-fib: On Eliquis at  home,restarted.Not on rate control meds.  Went into A-fib with RVR after surgery, currently on Cardizem drip.  Might need rate control oral medications  COVID infection: Tested  positive on September 25.  Completed course of antiviral.  On room air.  Lifted precautions  History of CVA/hyperlipidemia: On aspirin, Eliquis at home,  Stress incontinence: Continue home Myrbetriq  Neurocognitive deficits: Continue supportive care , delirium precautions  Depression/anxiety/insomnia: Continue home BuSpar, duloxetine  Bilateral lower extremity edema:  BNP elevated.  Echo showed EF of 55 to 60%, indeterminate diastolic parameters.Significantly improved with lasix.Resolved          DVT prophylaxis:apixaban (ELIQUIS) tablet 2.5 mg Start: 08/17/22 0800 SCDs Start: 08/16/22 1703 SCDs Start: 08/14/22 1906 apixaban (ELIQUIS) tablet 2.5 mg     Code Status: Full Code  Family Communication: Called husband [name] on phone on 10/7  Patient status:Inpatient  Patient is from :Home  Anticipated discharge to:SnF  Estimated DC date:2-3 days   Consultants: Orthopedics  Procedures:None yet  Antimicrobials:  Anti-infectives (From admission, onward)    Start     Dose/Rate Route Frequency Ordered Stop   08/16/22 1800  ceFAZolin (ANCEF) IVPB 2g/100 mL premix  Status:  Discontinued        2 g 200 mL/hr over 30 Minutes Intravenous Every 6 hours 08/16/22 1702 08/16/22 1704   08/16/22 1523  vancomycin (VANCOCIN) powder  Status:  Discontinued          As needed 08/16/22 1523 08/16/22 1531   08/16/22 0745  ceFAZolin (ANCEF) IVPB 2g/100 mL premix        2 g 200 mL/hr over 30 Minutes Intravenous On call to O.R. 08/16/22 1950  08/16/22 1321   08/14/22 2100  cefTRIAXone (ROCEPHIN) 1 g in sodium chloride 0.9 % 100 mL IVPB        1 g 200 mL/hr over 30 Minutes Intravenous Every 24 hours 08/14/22 2011 08/21/22 2059       Subjective: Patient seen and examined at bedside this morning.  She looks very  comfortable, sitting on the bed.  On room air.  Denies any shortness of breath or cough.  Heart rate ranging from 90-110.  Blood pressure little soft but she does not complain of any lightheadedness or dizziness.  Objective: Vitals:   08/16/22 2332 08/17/22 0341 08/17/22 0434 08/17/22 0711  BP: 106/63 (!) 96/59 (!) 106/57 (!) 93/59  Pulse: 90 96 95 84  Resp: '16 18 17 18  '$ Temp:   98.8 F (37.1 C) 98.2 F (36.8 C)  TempSrc:   Oral Oral  SpO2: 98%  100%   Weight:      Height:        Intake/Output Summary (Last 24 hours) at 08/17/2022 1106 Last data filed at 08/17/2022 0731 Gross per 24 hour  Intake 1201.02 ml  Output 1300 ml  Net -98.98 ml   Filed Weights   08/16/22 0554 08/16/22 1054  Weight: 86.2 kg 86.2 kg    Examination:  General exam: Overall comfortable, not in distress, obese HEENT: PERRL Respiratory system:  few crackles in the right base Cardiovascular system: Irregularly irregular rhythm Gastrointestinal system: Abdomen is nondistended, soft and nontender. Central nervous system: Alert and oriented Extremities: No edema, no clubbing ,no cyanosis, dressing around the left knee Skin: No rashes, no ulcers,no icterus      Data Reviewed: I have personally reviewed following labs and imaging studies  CBC: Recent Labs  Lab 08/14/22 2032 08/15/22 0254 08/17/22 0221  WBC 9.3 7.1 16.9*  HGB 10.5* 10.4* 10.8*  HCT 31.8* 31.1* 34.2*  MCV 94.9 94.5 96.1  PLT 324 319 676   Basic Metabolic Panel: Recent Labs  Lab 08/14/22 2032 08/15/22 0254 08/16/22 0241 08/17/22 0221  NA 138 138 139 135  K 3.6 3.7 3.6 3.8  CL 102 101 98 93*  CO2 '26 27 30 28  '$ GLUCOSE 121* 99 103* 132*  BUN '8 8 9 13  '$ CREATININE 0.60 0.60 0.69 1.32*  CALCIUM 9.1 9.0 9.1 8.8*     Recent Results (from the past 240 hour(s))  Surgical pcr screen     Status: None   Collection Time: 08/16/22  7:00 AM   Specimen: Nasal Mucosa; Nasal Swab  Result Value Ref Range Status   MRSA, PCR NEGATIVE  NEGATIVE Final   Staphylococcus aureus NEGATIVE NEGATIVE Final    Comment: (NOTE) The Xpert SA Assay (FDA approved for NASAL specimens in patients 2 years of age and older), is one component of a comprehensive surveillance program. It is not intended to diagnose infection nor to guide or monitor treatment. Performed at [facility], [place] [address], [place], [place] [address]   Surgical pcr screen     Status: None   Collection Time: 08/16/22 11:19 AM   Specimen: Nasal Mucosa; Nasal Swab  Result Value Ref Range Status   MRSA, PCR NEGATIVE NEGATIVE Final   Staphylococcus aureus NEGATIVE NEGATIVE Final    Comment: (NOTE) The Xpert SA Assay (FDA approved for NASAL specimens in patients 18 years of age and older), is one component of a comprehensive surveillance program. It is not intended to diagnose infection nor to guide or monitor treatment. Performed at  [facility], [place] [address], [place], [place] [address]   Aerobic/Anaerobic Culture w Gram Stain (surgical/deep wound)     Status: None (Preliminary result)   Collection Time: 08/16/22  2:01 PM   Specimen: Other Source; Tissue  Result Value Ref Range Status   Specimen Description KNEE LEFT  Final   Special Requests SAMPLE NO 1  Final   Gram Stain NO WBC SEEN NO ORGANISMS SEEN   Final   Culture   Final    NO GROWTH < 24 HOURS Performed at [facility], [address] [place], [place] [address]    Report Status PENDING  Incomplete  Aerobic/Anaerobic Culture w Gram Stain (surgical/deep wound)     Status: None (Preliminary result)   Collection Time: 08/16/22  2:02 PM   Specimen: Other Source; Tissue  Result Value Ref Range Status   Specimen Description TISSUE LEFT KNEE  Final   Special Requests SAMPLE NO 2  Final   Gram Stain   Final    WBC PRESENT, PREDOMINANTLY MONONUCLEAR NO ORGANISMS SEEN    Culture   Final    NO GROWTH < 24 HOURS Performed at [facility] [address] [place],  [place] [address]    Report Status PENDING  Incomplete  Aerobic/Anaerobic Culture w Gram Stain (surgical/deep wound)     Status: None (Preliminary result)   Collection Time: 08/16/22  2:02 PM   Specimen: Other Source; Tissue  Result Value Ref Range Status   Specimen Description TISSUE LEFT KNEE  Final   Special Requests SAMPLE NO 3  Final   Gram Stain   Final    WBC PRESENT, PREDOMINANTLY MONONUCLEAR NO ORGANISMS SEEN    Culture   Final    NO GROWTH < 24 HOURS Performed at [facility] [address] [place], [place] [address]    Report Status PENDING  Incomplete     Radiology Studies: DG CHEST PORT 1 VIEW  Result Date: 08/16/2022 CLINICAL DATA:  Short of breath EXAM: PORTABLE CHEST 1 VIEW COMPARISON:  Three hundred eighteen FINDINGS: Single frontal view of the chest demonstrates an unremarkable cardiac silhouette. Chronic elevation of the left hemidiaphragm. No acute airspace disease, effusion, or pneumothorax. No acute bony abnormality. IMPRESSION: 1. No acute intrathoracic process. Electronically Signed   By: Randa Ngo M.D.   On: 08/16/2022 20:39   ECHOCARDIOGRAM COMPLETE  Result Date: 08/16/2022    ECHOCARDIOGRAM REPORT   Patient Name:   [name] Date of Exam: 08/15/2022 Medical Rec #:  [id number]     Height:       65.0 in Accession #:    [id number]    Weight:       206.9 lb Date of Birth:  12-May-1946    BSA:          2.007 m Patient Age:    87 years      BP:           121/82 mmHg Patient Gender: F             HR:           115 bpm. Exam Location:  Inpatient Procedure: 2D Echo, Color Doppler and Cardiac Doppler Indications:    CHF  History:        Patient has no prior history of Echocardiogram examinations.                 Stroke; Signs/Symptoms:Murmur.  Sonographer:  Memory Argue Referring Phys: [id number] Kailen Name  Sonographer Comments: Technically difficult study due to poor echo windows. IMPRESSIONS  1. Poor quality exam. Consider repeat exam when HR is better  controlled.  2. Left ventricular ejection fraction, by estimation, is 55 to 60%. The left ventricle has normal function. Left ventricular endocardial border not optimally defined to evaluate regional wall motion. Left ventricular diastolic parameters are indeterminate. Definity contrast not used.  3. Right ventricular systolic function is normal. The right ventricular size is normal. There is normal pulmonary artery systolic pressure.  4. Left atrial size was moderately dilated.  5. The mitral valve is grossly normal. Trivial mitral valve regurgitation. No evidence of mitral stenosis.  6. The aortic valve was not well visualized. Aortic valve regurgitation is not visualized. Aortic valve sclerosis is present, with no evidence of aortic valve stenosis.  7. The inferior vena cava is normal in size with <50% respiratory variability, suggesting right atrial pressure of 8 mmHg. FINDINGS  Left Ventricle: Left ventricular ejection fraction, by estimation, is 55 to 60%. The left ventricle has normal function. Left ventricular endocardial border not optimally defined to evaluate regional wall motion. The left ventricular internal cavity size was normal in size. There is no left ventricular hypertrophy. Left ventricular diastolic parameters are indeterminate. Right Ventricle: The right ventricular size is normal. No increase in right ventricular wall thickness. Right ventricular systolic function is normal. There is normal pulmonary artery systolic pressure. The tricuspid regurgitant velocity is 2.19 m/s, and  with an assumed right atrial pressure of 8 mmHg, the estimated right ventricular systolic pressure is 26.7 mmHg. Left Atrium: Left atrial size was moderately dilated. Right Atrium: Right atrial size was normal in size. Pericardium: There is no evidence of pericardial effusion. Mitral Valve: The mitral valve is grossly normal. Trivial mitral valve regurgitation. No evidence of mitral valve stenosis. Tricuspid Valve: The  tricuspid valve is not well visualized. Tricuspid valve regurgitation is trivial. No evidence of tricuspid stenosis. Aortic Valve: The aortic valve was not well visualized. Aortic valve regurgitation is not visualized. Aortic valve sclerosis is present, with no evidence of aortic valve stenosis. Aortic valve mean gradient measures 7.0 mmHg. Aortic valve peak gradient measures 13.1 mmHg. Aortic valve area, by VTI measures 2.00 cm. Pulmonic Valve: The pulmonic valve was not assessed. Pulmonic valve regurgitation is not visualized. No evidence of pulmonic stenosis. Aorta: The ascending aorta was not well visualized. Venous: The inferior vena cava is normal in size with less than 50% respiratory variability, suggesting right atrial pressure of 8 mmHg. IAS/Shunts: No atrial level shunt detected by color flow Doppler.  LEFT VENTRICLE PLAX 2D LVIDd:         4.00 cm LVIDs:         2.60 cm LV PW:         1.00 cm LV IVS:        1.00 cm LVOT diam:     2.00 cm LV SV:         55 LV SV Index:   28 LVOT Area:     3.14 cm  RIGHT VENTRICLE TAPSE (M-mode): 2.0 cm LEFT ATRIUM             Index        RIGHT ATRIUM           Index LA diam:        3.20 cm 1.59 cm/m   RA Area:     15.10 cm LA Vol (A2C):  80.8 ml 40.24 ml/m  RA Volume:   38.50 ml  19.18 ml/m LA Vol (A4C):   95.9 ml 47.79 ml/m LA Biplane Vol: 95.6 ml 47.64 ml/m  AORTIC VALVE AV Area (Vmax):    1.89 cm AV Area (Vmean):   1.92 cm AV Area (VTI):     2.00 cm AV Vmax:           181.00 cm/s AV Vmean:          124.000 cm/s AV VTI:            0.277 m AV Peak Grad:      13.1 mmHg AV Mean Grad:      7.0 mmHg LVOT Vmax:         109.00 cm/s LVOT Vmean:        75.800 cm/s LVOT VTI:          0.176 m LVOT/AV VTI ratio: 0.64  AORTA Ao Root diam: 3.60 cm MR Peak grad: 37.5 mmHg   TRICUSPID VALVE MR Vmax:      306.00 cm/s TR Peak grad:   19.2 mmHg                           TR Vmax:        219.00 cm/s                            SHUNTS                           Systemic VTI:  0.18  m                           Systemic Diam: 2.00 cm Cherlynn Kaiser MD Electronically signed by Cherlynn Kaiser MD Signature Date/Time: 08/16/2022/6:03:32 AM    Final     Scheduled Meds:  acetaminophen  500 mg Oral Q6H   apixaban  2.5 mg Oral Q12H   busPIRone  30 mg Oral BID   docusate sodium  100 mg Oral BID   DULoxetine  120 mg Oral QHS   mirabegron ER  50 mg Oral Daily   pantoprazole  40 mg Oral Daily   polyethylene glycol  17 g Oral Daily   senna  1 tablet Oral BID   sodium chloride flush  3 mL Intravenous Q12H   Continuous Infusions:  cefTRIAXone (ROCEPHIN)  IV 1 g (08/16/22 2217)   diltiazem (CARDIZEM) infusion 10 mg/hr (08/17/22 1050)   methocarbamol (ROBAXIN) IV       LOS: 3 days   Shelly Coss, MD [facility] P10/05/2022, 11:06 AM

## 2022-08-17 NOTE — Progress Notes (Signed)
     Subjective: Patient back in room after surgery. Husband at bedside. Patient lying comfortably in bed. Denies distal n/t.  Objective:   VITALS:   Vitals:   08/16/22 2223 08/16/22 2332 08/17/22 0341 08/17/22 0434  BP: 106/76 106/63 (!) 96/59 (!) 106/57  Pulse: (!) 107 90 96 95  Resp: '19 16 18 17  '$ Temp:    98.8 F (37.1 C)  TempSrc:    Oral  SpO2: 99% 98%  100%  Weight:      Height:        Sensation intact distally Dorsiflexion/Plantar flexion intact Incision: prevena wound vac holding suction no output in cannister Compartment soft   Lab Results  Component Value Date   WBC 16.9 (H) 08/17/2022   HGB 10.8 (L) 08/17/2022   HCT 34.2 (L) 08/17/2022   MCV 96.1 08/17/2022   PLT 387 08/17/2022   BMET    Component Value Date/Time   NA 135 08/17/2022 0221   NA 140 05/27/2017 1553   K 3.8 08/17/2022 0221   CL 93 (L) 08/17/2022 0221   CO2 28 08/17/2022 0221   GLUCOSE 132 (H) 08/17/2022 0221   BUN 13 08/17/2022 0221   BUN 15 05/27/2017 1553   CREATININE 1.32 (H) 08/17/2022 0221   CALCIUM 8.8 (L) 08/17/2022 0221   GFRNONAA 42 (L) 08/17/2022 0221    Assessment/Plan: 1 Day Post-Op   Principal Problem:   Periprosthetic fracture around internal prosthetic left knee joint Active Problems:   Neck pain, chronic   History of CVA (cerebrovascular accident)   Hyperlipidemia   Asthma   Esophageal reflux   Paroxysmal atrial fibrillation (HCC)   History of DVT of lower extremity   SARS-CoV-2 positive   Neurocognitive deficits  S/p repair of patellar tendon injury LLE 10/6 with Dr. Griffin Basil  Discussed intraop findings during surgery with patient and husband at bedside. Rupture of the patellar tendon partially of the tibial tubercle and inferior pole of the patella. Discussed augmented hamstring repair with Dr. Griffin Basil. No signs of joint infection introap but did send tissue specimen for culture. Thorough debridement and irrigation was performed with poly exchange. New 80m  poly reinserted. Overall tissues of poor quality so will plan for prolonged immobilization in extension. Currently in extension splint.   Post op recs: WB: TDWB AUKG:[id number] abx pending intraop cxs. Currently on ceftriaxone Dressing: keep wound vac and splint intact.  DVT prophylaxis: eliquis 2.'5mg'$  BID Follow up: 1 week post discharge for wound check [address] [place] [id number] Office Phone: [phone] DWillaim Sheng10/05/2022, 6:54 AM  DCharlies Constable MD  Contact information:   [phone]-5pm epic message Dr. MZachery Dakins or call office for patient follow up: [phone] After hours and holidays please check [email or web address] for group call information for [facility]

## 2022-08-17 NOTE — Evaluation (Signed)
Occupational Therapy Evaluation Patient Details Name: [name] MRN: [id number] DOB: [birth year] Today's Date: 08/17/2022   History of Present Illness 76 y/o female admitted on 08/16/22 following L TKA revision and repair of patellar tendon injury. Recent L TKA on 9/11 and discharged to SNF then home where she hit knee on chair causing displaced patellar fracture. PMH: CVA.   Clinical Impression   Patient admitted for the diagnosis above.  Patient will need to transition back to SNF level for continued rehab to learn appropriate transfer technique, and strategies to maximize her ADL independence.  OT can continue in the acute setting.        Recommendations for follow up therapy are one component of a multi-disciplinary discharge planning process, led by the attending physician.  Recommendations may be updated based on patient status, additional functional criteria and insurance authorization.   Follow Up Recommendations  Skilled nursing-short term rehab (<3 hours/day)    Assistance Recommended at Discharge Frequent or constant Supervision/Assistance  Patient can return home with the following Two people to help with walking and/or transfers;A lot of help with bathing/dressing/bathroom;Assist for transportation    Functional Status Assessment  Patient has had a recent decline in their functional status and demonstrates the ability to make significant improvements in function in a reasonable and predictable amount of time.  Equipment Recommendations  Wheelchair cushion (measurements OT);Wheelchair (measurements OT)    Recommendations for Other Services       Precautions / Restrictions Precautions Precautions: Fall;Knee Precaution Comments: Wound vac Required Braces or Orthoses: Knee Immobilizer - Left Knee Immobilizer - Left: On at all times Restrictions Weight Bearing Restrictions: Yes LLE Weight Bearing: Touchdown weight bearing      Mobility Bed Mobility Overal bed  mobility: Needs Assistance Bed Mobility: Supine to Sit, Sit to Supine     Supine to sit: Total assist, +2 for physical assistance Sit to supine: Total assist, +2 for physical assistance     Patient Response: Cooperative  Transfers                          Balance Overall balance assessment: Needs assistance Sitting-balance support: Feet supported Sitting balance-Leahy Scale: Fair   Postural control: Posterior lean                                 ADL either performed or assessed with clinical judgement   ADL Overall ADL's : Needs assistance/impaired Eating/Feeding: Set up;Bed level   Grooming: Wash/dry hands;Set up;Bed level   Upper Body Bathing: Minimal assistance;Bed level   Lower Body Bathing: Total assistance;Bed level   Upper Body Dressing : Minimal assistance;Bed level   Lower Body Dressing: Total assistance;Bed level       Toileting- Clothing Manipulation and Hygiene: Total assistance;Bed level               Vision Patient Visual Report: No change from baseline       Perception Perception Perception: Within Functional Limits   Praxis Praxis Praxis: Intact    Pertinent Vitals/Pain Pain Assessment Pain Assessment: Faces Faces Pain Scale: Hurts little more Pain Location: L knee with movement Pain Descriptors / Indicators: Sore, Guarding, Discomfort Pain Intervention(s): Monitored during session     Hand Dominance Right   Extremity/Trunk Assessment Upper Extremity Assessment Upper Extremity Assessment: Generalized weakness   Lower Extremity Assessment Lower Extremity Assessment: Defer to PT evaluation   Cervical /  Trunk Assessment Cervical / Trunk Assessment: Kyphotic   Communication Communication Communication: No difficulties   Cognition Arousal/Alertness: Awake/alert Behavior During Therapy: Flat affect Overall Cognitive Status: No family/caregiver present to determine baseline cognitive functioning                                Problem Solving: Slow processing, Decreased initiation, Requires verbal cues General Comments: increased time/repetition. pt HOH     General Comments   VSS    Exercises     Shoulder Instructions      Home Living Family/patient expects to be discharged to:: Skilled nursing facility Living Arrangements: Spouse/significant other                                      Prior Functioning/Environment Prior Level of Function : Needs assist       Physical Assist : Mobility (physical);ADLs (physical)     Mobility Comments: stating she has not walked much due to issues with dizziness ADLs Comments: Assist as needed from facility staff        OT Problem List: Decreased strength;Decreased range of motion;Decreased activity tolerance;Impaired balance (sitting and/or standing);Decreased safety awareness;Decreased cognition      OT Treatment/Interventions: Self-care/ADL training;Therapeutic exercise;Patient/family education;Balance training;Therapeutic activities;DME and/or AE instruction    OT Goals(Current goals can be found in the care plan section) Acute Rehab OT Goals Patient Stated Goal: none stated OT Goal Formulation: With patient Time For Goal Achievement: 08/30/22 Potential to Achieve Goals: Fair ADL Goals Pt Will Perform Grooming: with set-up;sitting Pt Will Perform Upper Body Dressing: with set-up;sitting Pt/caregiver will Perform Home Exercise Program: Increased strength;Both right and left upper extremity;With Supervision;With theraband Additional ADL Goal #1: Patient will roll L or R with Min A to increase independence with toileting.  OT Frequency: Min 2X/week    Co-evaluation              AM-PAC OT "6 Clicks" Daily Activity     Outcome Measure Help from another person eating meals?: None Help from another person taking care of personal grooming?: A Little Help from another person toileting, which includes  using toliet, bedpan, or urinal?: Total Help from another person bathing (including washing, rinsing, drying)?: A Lot Help from another person to put on and taking off regular upper body clothing?: A Little Help from another person to put on and taking off regular lower body clothing?: Total 6 Click Score: 14   End of Session Nurse Communication: Mobility status  Activity Tolerance: Patient tolerated treatment well Patient left: in bed;with call bell/phone within reach;with bed alarm set  OT Visit Diagnosis: Muscle weakness (generalized) (M62.81);Pain Pain - Right/Left: Left Pain - part of body: Knee;Leg                Time: 7026-3785 OT Time Calculation (min): 23 min Charges:  OT General Charges $OT Visit: 1 Visit OT Evaluation $OT Eval Moderate Complexity: 1 Mod  08/17/2022  RP, OTR/L  Acute Rehabilitation Services  Office:  [phone]   [name] 08/17/2022, 10:46 AM

## 2022-08-17 NOTE — Progress Notes (Signed)
Subjective: 1 Day Post-Op Procedure(s) (LRB): IRRIGATION AND DEBRIDEMENT LEFT KNEE REVISION WITH PATELLAR TENDON REPAIR and poly exchange (Left) KNEE POLY EXCHANGE (Left) Patient reports pain as 8 on 0-10 scale.  Some confusion, stating in pain.  Objective: Vital signs in last 24 hours: Temp:  [97.5 F (36.4 C)-98.8 F (37.1 C)] 98.3 F (36.8 C) (10/07 1247) Pulse Rate:  [84-122] 113 (10/07 1443) Resp:  [11-21] 17 (10/07 1247) BP: (87-132)/(38-93) 105/71 (10/07 1443) SpO2:  [86 %-100 %] 96 % (10/07 1100)  Intake/Output from previous day: 10/06 0701 - 10/07 0700 In: 1211 [P.O.:10; I.V.:901; IV Piggyback:300] Out: 1000 [Urine:900; Blood:100] Intake/Output this shift: Total I/O In: 240 [P.O.:240] Out: 600 [Urine:600]  Recent Labs    08/14/22 2032 08/15/22 0254 08/17/22 0221  HGB 10.5* 10.4* 10.8*   Recent Labs    08/15/22 0254 08/17/22 0221  WBC 7.1 16.9*  RBC 3.29* 3.56*  HCT 31.1* 34.2*  PLT 319 387   Recent Labs    08/16/22 0241 08/17/22 0221  NA 139 135  K 3.6 3.8  CL 98 93*  CO2 30 28  BUN 9 13  CREATININE 0.69 1.32*  GLUCOSE 103* 132*  CALCIUM 9.1 8.8*   No results for input(s): "LABPT", "INR" in the last 72 hours.  Neurovascular intact Sensation intact distally Intact pulses distally Dorsiflexion/Plantar flexion intact Incision: dressing C/D/I   Assessment/Plan: 1 Day Post-Op Procedure(s) (LRB): IRRIGATION AND DEBRIDEMENT LEFT KNEE REVISION WITH PATELLAR TENDON REPAIR and poly exchange (Left) KNEE POLY EXCHANGE (Left) Up with therapy TDWB LLE Cx still pending Pain control as ordered On eliquis   Sila Sarsfield 08/17/2022, 2:58 PM

## 2022-08-17 NOTE — Discharge Instructions (Addendum)
Orthopedic Discharge Instructions  Diet: As you were doing prior to hospitalization   Shower:   If you have a splint on, leave the splint in place and keep the splint dry with a plastic bag.  Dressing:  If you have a splint, then just leave the splint in place and we will change your bandages during your first follow-up appointment.  If water gets in the splint or the splint gets saturated please call the clinic and we can see you to change your splint.  Activity:  Increase activity slowly as tolerated, but follow the weight bearing instructions below.  The rules on driving is that you can not be taking narcotics while you drive, and you must feel in control of the vehicle.    Weight Bearing:   Touch down weight bearing left lower extremity.    Blood clot prevention (DVT Prophylaxis): After surgery you are at an increased risk for a blood clot. you were prescribed a blood thinner, Eliquis, to help reduce your risk of getting a blood clot. This will help prevent a blood clot. Signs of a pulmonary embolus (blood clot in the lungs) include sudden short of breath, feeling lightheaded or dizzy, chest pain with a deep breath, rapid pulse rapid breathing. Signs of a blood clot in your arms or legs include new unexplained swelling and cramping, warm, red or darkened skin around the painful area. Please call the office or 911 right away if these signs or symptoms develop. To prevent constipation: you may use a stool softener such as -  Colace (over the counter) 100 mg by mouth twice a day  Drink plenty of fluids (prune juice may be helpful) and high fiber foods Miralax (over the counter) for constipation as needed.    Itching:  If you experience itching with your medications, try taking only a single pain pill, or even half a pain pill at a time.  You may take up to 10 pain pills per day, and you can also use benadryl over the counter for itching or also to help with sleep.   Precautions:  If you  experience chest pain or shortness of breath - call 911 immediately for transfer to the hospital emergency department!!   Call office [phone]) for the following: Temperature greater than 101F Persistent nausea and vomiting Severe uncontrolled pain Redness, tenderness, or signs of infection (pain, swelling, redness, odor or green/yellow discharge around the site) Difficulty breathing, headache or visual disturbances Hives Persistent dizziness or light-headedness Extreme fatigue Any other questions or concerns you may have after discharge  In an emergency, call 911 or go to an Emergency Department at a nearby hospital  Follow- Up Appointment:  Please call for an appointment to be seen approximately 2-3 week after surgery in [facility] with your surgeon Dr. Charlies Constable - [phone] Address: [address], [place], [place] [address]    Information on my medicine - ELIQUIS (apixaban) Why was Eliquis prescribed for you? Eliquis was prescribed for you to reduce the risk of a blood clot forming that can cause a stroke if you have a medical condition called atrial fibrillation (a type of irregular heartbeat).  What do You need to know about Eliquis ? Take your Eliquis TWICE DAILY - one tablet in the morning and one tablet in the evening with or without food. If you have difficulty swallowing the tablet whole please discuss with your pharmacist how to take the medication safely.  Take Eliquis exactly as prescribed by your  doctor and DO NOT stop taking Eliquis without talking to the doctor who prescribed the medication.  Stopping may increase your risk of developing a stroke.  Refill your prescription before you run out.  After discharge, you should have regular check-up appointments with your healthcare provider that is prescribing your Eliquis.  In the future your dose may need to be changed if your kidney function or weight changes by a significant amount or as you  get older.  What do you do if you miss a dose? If you miss a dose, take it as soon as you remember on the same day and resume taking twice daily.  Do not take more than one dose of ELIQUIS at the same time to make up a missed dose.  Important Safety Information A possible side effect of Eliquis is bleeding. You should call your healthcare provider right away if you experience any of the following: Bleeding from an injury or your nose that does not stop. Unusual colored urine (red or dark brown) or unusual colored stools (red or black). Unusual bruising for unknown reasons. A serious fall or if you hit your head (even if there is no bleeding).  Some medicines may interact with Eliquis and might increase your risk of bleeding or clotting while on Eliquis. To help avoid this, consult your healthcare provider or pharmacist prior to using any new prescription or non-prescription medications, including herbals, vitamins, non-steroidal anti-inflammatory drugs (NSAIDs) and supplements.  This website has more information on Eliquis (apixaban): http://www.eliquis.com/eliquis/home

## 2022-08-17 NOTE — Evaluation (Signed)
Physical Therapy Evaluation Patient Details Name: [name] MRN: [id number] DOB: 1946-10-14 Today's Date: 08/17/2022  History of Present Illness  76 y/o female admitted on 08/16/22 following L TKA revision and repair of patellar tendon injury. Recent L TKA on 9/11 and discharged to SNF then home where she hit knee on chair causing displaced patellar fracture. PMH: CVA.  Clinical Impression  Patient admitted with the above. PTA, patient from SNF and requiring assistance for all mobility and ADLs. Reports not walking due to dizziness. Patient with splint on L LE to keep in extension. Educated on TWB status and impact on mobility, patient verbalized understanding. Patient presents with weakness, impaired funcitonal mobility, and impaired cognition. Unsure if patient will be able to stand with splint but will problem solve safest way for OOB mobility. Patient will benefit from skilled PT services during acute stay to address listed deficits. Recommend return to SNF for continued rehab.        Recommendations for follow up therapy are one component of a multi-disciplinary discharge planning process, led by the attending physician.  Recommendations may be updated based on patient status, additional functional criteria and insurance authorization.  Follow Up Recommendations Skilled nursing-short term rehab (<3 hours/day) Can patient physically be transported by private vehicle: No    Assistance Recommended at Discharge Frequent or constant Supervision/Assistance  Patient can return home with the following  Two people to help with walking and/or transfers;Two people to help with bathing/dressing/bathroom;Assistance with cooking/housework;Direct supervision/assist for medications management;Direct supervision/assist for financial management;Assist for transportation;Help with stairs or ramp for entrance    Equipment Recommendations None recommended by PT  Recommendations for Other Services        Functional Status Assessment Patient has had a recent decline in their functional status and demonstrates the ability to make significant improvements in function in a reasonable and predictable amount of time.     Precautions / Restrictions Precautions Precautions: Fall;Knee Precaution Comments: Wound vac Required Braces or Orthoses: Knee Immobilizer - Left Knee Immobilizer - Left: Other (comment);On at all times (splint on LLE) Restrictions Weight Bearing Restrictions: Yes LLE Weight Bearing: Touchdown weight bearing      Mobility  Bed Mobility Overal bed mobility: Needs Assistance Bed Mobility: Supine to Sit, Sit to Supine     Supine to sit: Total assist, +2 for physical assistance Sit to supine: Total assist, +2 for physical assistance        Transfers                        Ambulation/Gait                  Stairs            Wheelchair Mobility    Modified Rankin (Stroke Patients Only)       Balance Overall balance assessment: Needs assistance Sitting-balance support: Feet supported Sitting balance-Leahy Scale: Fair   Postural control: Posterior lean                                   Pertinent Vitals/Pain Pain Assessment Pain Assessment: Faces Faces Pain Scale: Hurts little more Pain Location: L knee with movement Pain Descriptors / Indicators: Sore, Guarding, Discomfort Pain Intervention(s): Monitored during session    Home Living Family/patient expects to be discharged to:: Skilled nursing facility Living Arrangements: Spouse/significant other  Prior Function Prior Level of Function : Needs assist       Physical Assist : Mobility (physical);ADLs (physical)     Mobility Comments: stating she has not walked much due to issues with dizziness ADLs Comments: Assist as needed from facility staff     Hand Dominance   Dominant Hand: Right    Extremity/Trunk Assessment    Upper Extremity Assessment Upper Extremity Assessment: Defer to OT evaluation    Lower Extremity Assessment Lower Extremity Assessment: LLE deficits/detail LLE Deficits / Details: Unable to assess due to immobilization with splinting LLE: Unable to fully assess due to immobilization    Cervical / Trunk Assessment Cervical / Trunk Assessment: Kyphotic  Communication   Communication: No difficulties  Cognition Arousal/Alertness: Awake/alert Behavior During Therapy: Flat affect Overall Cognitive Status: No family/caregiver present to determine baseline cognitive functioning                               Problem Solving: Slow processing, Decreased initiation, Requires verbal cues General Comments: increased time/repetition. pt HOH        General Comments      Exercises     Assessment/Plan    PT Assessment Patient needs continued PT services  PT Problem List Decreased strength;Decreased mobility;Decreased range of motion;Decreased activity tolerance;Decreased balance;Pain;Decreased knowledge of use of DME;Decreased safety awareness;Decreased cognition;Decreased coordination       PT Treatment Interventions DME instruction;Functional mobility training;Therapeutic exercise;Therapeutic activities;Balance training;Patient/family education    PT Goals (Current goals can be found in the Care Plan section)  Acute Rehab PT Goals Patient Stated Goal: none stated PT Goal Formulation: With patient Time For Goal Achievement: 08/31/22 Potential to Achieve Goals: Fair    Frequency Min 2X/week     Co-evaluation               AM-PAC PT "6 Clicks" Mobility  Outcome Measure Help needed turning from your back to your side while in a flat bed without using bedrails?: Total Help needed moving from lying on your back to sitting on the side of a flat bed without using bedrails?: Total Help needed moving to and from a bed to a chair (including a wheelchair)?: Total Help  needed standing up from a chair using your arms (e.g., wheelchair or bedside chair)?: Total Help needed to walk in hospital room?: Total Help needed climbing 3-5 steps with a railing? : Total 6 Click Score: 6    End of Session   Activity Tolerance: Patient tolerated treatment well Patient left: in bed;with call bell/phone within reach;with bed alarm set Nurse Communication: Mobility status PT Visit Diagnosis: Other abnormalities of gait and mobility (R26.89);Muscle weakness (generalized) (M62.81)    Time: 4650-3546 PT Time Calculation (min) (ACUTE ONLY): 21 min   Charges:   PT Evaluation $PT Eval Moderate Complexity: 1 Mod          Mabelle Mungin A. Gilford Rile PT, DPT Acute Rehabilitation Services Office [phone]   Linna Hoff 08/17/2022, 11:03 AM

## 2022-08-18 DIAGNOSIS — M9712XA Periprosthetic fracture around internal prosthetic left knee joint, initial encounter: Secondary | ICD-10-CM | POA: Diagnosis not present

## 2022-08-18 LAB — BASIC METABOLIC PANEL
Anion gap: 12 (ref 5–15)
BUN: 24 mg/dL — ABNORMAL HIGH (ref 8–23)
CO2: 28 mmol/L (ref 22–32)
Calcium: 8.8 mg/dL — ABNORMAL LOW (ref 8.9–10.3)
Chloride: 95 mmol/L — ABNORMAL LOW (ref 98–111)
Creatinine, Ser: 1.33 mg/dL — ABNORMAL HIGH (ref 0.44–1.00)
GFR, Estimated: 42 mL/min — ABNORMAL LOW (ref >60)
Glucose, Bld: 121 mg/dL — ABNORMAL HIGH (ref 70–99)
Potassium: 3.9 mmol/L (ref 3.5–5.1)
Sodium: 135 mmol/L (ref 135–145)

## 2022-08-18 LAB — CBC
HCT: 31 % — ABNORMAL LOW (ref 36.0–46.0)
Hemoglobin: 9.8 g/dL — ABNORMAL LOW (ref 12.0–15.0)
MCH: 30.5 pg (ref 26.0–34.0)
MCHC: 31.6 g/dL (ref 30.0–36.0)
MCV: 96.6 fL (ref 80.0–100.0)
Platelets: 331 10*3/uL (ref 150–400)
RBC: 3.21 MIL/uL — ABNORMAL LOW (ref 3.87–5.11)
RDW: 14.5 % (ref 11.5–15.5)
WBC: 15.6 10*3/uL — ABNORMAL HIGH (ref 4.0–10.5)
nRBC: 0 % (ref 0.0–0.2)

## 2022-08-18 MED ORDER — ACETAMINOPHEN 325 MG PO TABS
650.0000 mg | ORAL_TABLET | Freq: Three times a day (TID) | ORAL | Status: DC
  Administered 2022-08-18 – 2022-08-23 (×15): 650 mg via ORAL
  Filled 2022-08-18 (×15): qty 2

## 2022-08-18 MED ORDER — APIXABAN 2.5 MG PO TABS
2.5000 mg | ORAL_TABLET | Freq: Two times a day (BID) | ORAL | Status: DC
  Administered 2022-08-18: 2.5 mg via ORAL
  Filled 2022-08-18: qty 1

## 2022-08-18 MED ORDER — METOPROLOL TARTRATE 12.5 MG HALF TABLET
12.5000 mg | ORAL_TABLET | Freq: Two times a day (BID) | ORAL | Status: DC
  Administered 2022-08-18 – 2022-08-19 (×3): 12.5 mg via ORAL
  Filled 2022-08-18 (×3): qty 1

## 2022-08-18 NOTE — Progress Notes (Signed)
PROGRESS NOTE  [name]  XBD:[id number] DOB: 1946/01/06 DOA: 08/14/2022 PCP: Asencion Noble, MD   Brief Narrative:  Patient is a 76 female with history of chronic pain syndrome, stroke, hyperlipidemia, stress incontinence, asthma, GERD, A-fib, DVT, depression, anxiety who presented from orthopedics office for management of left knee fracture.  She had a left total knee replacement on September 11 and was recovering at rehab facility but had a fall while at rehab facility and fractured her patella and her knee .  She was recently diagnosed with COVID on September 25 and was weak and lightheaded and fell.  No evidence of pneumonia as per chest x-ray that was done as an outpatient, she completed outpatient antiviral medication.  Orthopedics following, status post revision of total left knee arthroplasty, tendon repair with graft on 10/6  Assessment & Plan:  Principal Problem:   Periprosthetic fracture around internal prosthetic left knee joint Active Problems:   Neck pain, chronic   History of CVA (cerebrovascular accident)   Hyperlipidemia   Asthma   Esophageal reflux   Paroxysmal atrial fibrillation (HCC)   History of DVT of lower extremity   SARS-CoV-2 positive   Neurocognitive deficits  Prosthetic fracture of left knee joint: Fell at [facility].  Status post left total knee replacement on September 11.  Orthopedics following. status post revision of total left knee arthroplasty, tendon repair with graft on 10/6. PT/OT recommending SNF on discharge. Postoperatively, she was requiring oxygen for maintenance of saturation.  Currently on room air.  Chest x-ray did not show any significant findings  Suspected cellulitis/leukocytosis: Left lower extremity also appeared erythematous, warm on admission.  No fever.  Started on ceftriaxone with significant  improvement.  Developed  leukocytosis .  Aerobic/anaerobic culture sent on 10/6 is pending without any growth.  Paroxysmal A-fib:.Not on rate  control meds.  Went into A-fib with RVR after surgery, currently on Cardizem drip.  Now Cardizem drip stopped, on low-dose metoprolol.  Also started on Eliquis,low dose.CHA2DS2VASc of 5  Hypotension/AKI: Postoperatively she went into A-fib, became hypotensive and creatinine also trended up.  Continue gentle IV fluids.  COVID infection: Tested  positive on September 25.  Completed course of antiviral.  On room air.  Lifted precautions  History of CVA/hyperlipidemia: Not on anything at home  Stress incontinence: Continue home Myrbetriq  Neurocognitive deficits: Continue supportive care , delirium precautions.She is mostly oriented  Depression/anxiety/insomnia: Continue home BuSpar, duloxetine  Bilateral lower extremity edema:  BNP elevated.  Echo showed EF of 55 to 60%, indeterminate diastolic parameters.Significantly improved with lasix.Resolved          DVT prophylaxis:SCDs Start: 08/16/22 1703 SCDs Start: 08/14/22 1906 apixaban (ELIQUIS) tablet 5 mg     Code Status: Full Code  Family Communication: Called husband [name] on phone on 10/8  Patient status:Inpatient  Patient is from :SNF  Anticipated discharge to:SnF  Estimated DC date:2-3 days   Consultants: Orthopedics  Procedures:None yet  Antimicrobials:  Anti-infectives (From admission, onward)    Start     Dose/Rate Route Frequency Ordered Stop   08/16/22 1800  ceFAZolin (ANCEF) IVPB 2g/100 mL premix  Status:  Discontinued        2 g 200 mL/hr over 30 Minutes Intravenous Every 6 hours 08/16/22 1702 08/16/22 1704   08/16/22 1523  vancomycin (VANCOCIN) powder  Status:  Discontinued          As needed 08/16/22 1523 08/16/22 1531   08/16/22 0745  ceFAZolin (ANCEF) IVPB 2g/100 mL premix  2 g 200 mL/hr over 30 Minutes Intravenous On call to O.R. 08/16/22 4193 08/16/22 1321   08/14/22 2100  cefTRIAXone (ROCEPHIN) 1 g in sodium chloride 0.9 % 100 mL IVPB        1 g 200 mL/hr over 30 Minutes Intravenous Every  24 hours 08/14/22 2011 08/21/22 2059       Subjective: Patient seen and examined at the bedside today.  Hemodynamically stable.  Heart rate better controlled today.  Blood pressure is also better.  She was sitting on the bed.  Denies any new complaints  Objective: Vitals:   08/17/22 1247 08/17/22 1443 08/18/22 0500 08/18/22 1025  BP: (!) 88/50 105/71 97/69   Pulse: (!) 106 (!) 113 76   Resp: 17  16   Temp: 98.3 F (36.8 C)  98.5 F (36.9 C) 98.5 F (36.9 C)  TempSrc: Oral  Oral Oral  SpO2:   98%   Weight:      Height:        Intake/Output Summary (Last 24 hours) at 08/18/2022 1051 Last data filed at 08/18/2022 7902 Gross per 24 hour  Intake 440 ml  Output 300 ml  Net 140 ml   Filed Weights   08/16/22 0554 08/16/22 1054  Weight: 86.2 kg 86.2 kg    Examination:    General exam: Overall comfortable, not in distress, pleasantly confused elderly female HEENT: PERRL Respiratory system:  no wheezes or crackles  Cardiovascular system: Irregularly irregular rhythm Gastrointestinal system: Abdomen is nondistended, soft and nontender. Central nervous system: Alert and oriented to place, confused to time Extremities: No edema, no clubbing ,no cyanosis,dressing on right knee with wound vac  Skin: No rashes, no ulcers,no icterus     Data Reviewed: I have personally reviewed following labs and imaging studies  CBC: Recent Labs  Lab 08/14/22 2032 08/15/22 0254 08/17/22 0221 08/18/22 0316  WBC 9.3 7.1 16.9* 15.6*  HGB 10.5* 10.4* 10.8* 9.8*  HCT 31.8* 31.1* 34.2* 31.0*  MCV 94.9 94.5 96.1 96.6  PLT 324 319 387 409   Basic Metabolic Panel: Recent Labs  Lab 08/14/22 2032 08/15/22 0254 08/16/22 0241 08/17/22 0221 08/18/22 0316  NA 138 138 139 135 135  K 3.6 3.7 3.6 3.8 3.9  CL 102 101 98 93* 95*  CO2 '26 27 30 28 28  '$ GLUCOSE 121* 99 103* 132* 121*  BUN '8 8 9 13 '$ 24*  CREATININE 0.60 0.60 0.69 1.32* 1.33*  CALCIUM 9.1 9.0 9.1 8.8* 8.8*     Recent Results (from  the past 240 hour(s))  Surgical pcr screen     Status: None   Collection Time: 08/16/22  7:00 AM   Specimen: Nasal Mucosa; Nasal Swab  Result Value Ref Range Status   MRSA, PCR NEGATIVE NEGATIVE Final   Staphylococcus aureus NEGATIVE NEGATIVE Final    Comment: (NOTE) The Xpert SA Assay (FDA approved for NASAL specimens in patients 42 years of age and older), is one component of a comprehensive surveillance program. It is not intended to diagnose infection nor to guide or monitor treatment. Performed at [facility], [place] [address], [place], [place] [address]   Surgical pcr screen     Status: None   Collection Time: 08/16/22 11:19 AM   Specimen: Nasal Mucosa; Nasal Swab  Result Value Ref Range Status   MRSA, PCR NEGATIVE NEGATIVE Final   Staphylococcus aureus NEGATIVE NEGATIVE Final    Comment: (NOTE) The Xpert SA Assay (FDA approved for NASAL specimens in patients  12 years of age and older), is one component of a comprehensive surveillance program. It is not intended to diagnose infection nor to guide or monitor treatment. Performed at [facility], [place] [address], [place], [place] [address]   Aerobic/Anaerobic Culture w Gram Stain (surgical/deep wound)     Status: None (Preliminary result)   Collection Time: 08/16/22  2:01 PM   Specimen: Other Source; Tissue  Result Value Ref Range Status   Specimen Description KNEE LEFT  Final   Special Requests SAMPLE NO 1  Final   Gram Stain NO WBC SEEN NO ORGANISMS SEEN   Final   Culture   Final    NO GROWTH 2 DAYS Performed at [facility], [address], [place], [place] [address]    Report Status PENDING  Incomplete  Aerobic/Anaerobic Culture w Gram Stain (surgical/deep wound)     Status: None (Preliminary result)   Collection Time: 08/16/22  2:02 PM   Specimen: Other Source; Tissue  Result Value Ref Range Status   Specimen Description TISSUE LEFT KNEE  Final   Special Requests SAMPLE NO 2  Final   Gram  Stain   Final    WBC PRESENT, PREDOMINANTLY MONONUCLEAR NO ORGANISMS SEEN    Culture   Final    NO GROWTH 2 DAYS Performed at [facility], [address], [place], [place] [address]    Report Status PENDING  Incomplete  Aerobic/Anaerobic Culture w Gram Stain (surgical/deep wound)     Status: None (Preliminary result)   Collection Time: 08/16/22  2:02 PM   Specimen: Other Source; Tissue  Result Value Ref Range Status   Specimen Description TISSUE LEFT KNEE  Final   Special Requests SAMPLE NO 3  Final   Gram Stain   Final    WBC PRESENT, PREDOMINANTLY MONONUCLEAR NO ORGANISMS SEEN    Culture   Final    NO GROWTH 2 DAYS Performed at [facility], [address] [place], [place] [address]    Report Status PENDING  Incomplete     Radiology Studies: DG CHEST PORT 1 VIEW  Result Date: 08/16/2022 CLINICAL DATA:  Short of breath EXAM: PORTABLE CHEST 1 VIEW COMPARISON:  Three hundred eighteen FINDINGS: Single frontal view of the chest demonstrates an unremarkable cardiac silhouette. Chronic elevation of the left hemidiaphragm. No acute airspace disease, effusion, or pneumothorax. No acute bony abnormality. IMPRESSION: 1. No acute intrathoracic process. Electronically Signed   By: Randa Ngo M.D.   On: 08/16/2022 20:39    Scheduled Meds:  apixaban  5 mg Oral Q12H   busPIRone  30 mg Oral BID   docusate sodium  100 mg Oral BID   DULoxetine  120 mg Oral QHS   metoprolol tartrate  12.5 mg Oral BID   mirabegron ER  50 mg Oral Daily   pantoprazole  40 mg Oral Daily   polyethylene glycol  17 g Oral Daily   senna  1 tablet Oral BID   sodium chloride flush  3 mL Intravenous Q12H   Continuous Infusions:  sodium chloride 75 mL/hr at 08/18/22 0058   cefTRIAXone (ROCEPHIN)  IV Stopped (08/17/22 2124)   methocarbamol (ROBAXIN) IV     sodium chloride 500 mL/hr at 08/17/22 1620     LOS: 4 days   Shelly Coss, MD [facility] P10/06/2022, 10:51 AM

## 2022-08-18 NOTE — Social Work (Addendum)
Pt was admitted from [facility] for short term rehab. Prior to SNF she was from home with spouse per previous notes. CSW attempted to contact pt spouse and [facility] no one was available, pt is only oriented x2. CSW left messages and will continue to follow for DP needs.

## 2022-08-18 NOTE — Progress Notes (Signed)
Orthopaedic Trauma Service Progress Note     Weekend cross coverage   Patient ID: NAVJOT PILGRIM MRN: [id number] DOB/AGE: [birth year] 76 y.o.  Subjective:  Pain tolerable Has not been out of bed yet but sat on EOB with therapy yesterday Very apprehensive about getting out of bed   Intra-op cultures still with NGTD    ROS As above  Objective:   VITALS:   Vitals:   08/17/22 1247 08/17/22 1443 08/18/22 0500 08/18/22 1025  BP: (!) 88/50 105/71 97/69   Pulse: (!) 106 (!) 113 76   Resp: 17  16   Temp: 98.3 F (36.8 C)  98.5 F (36.9 C) 98.5 F (36.9 C)  TempSrc: Oral  Oral Oral  SpO2:   98%   Weight:      Height:        Estimated body mass index is 31.62 kg/m as calculated from the following:   Height as of this encounter: '5\' 5"'$  (1.651 m).   Weight as of this encounter: 86.2 kg.   Intake/Output      10/07 0701 10/08 0700 10/08 0701 10/09 0700   P.O. 240    I.V. (mL/kg)     IV Piggyback 200    Total Intake(mL/kg) 440 (5.1)    Urine (mL/kg/hr) 600 (0.3)    Blood     Total Output 600    Net -160           LABS  Results for orders placed or performed during the hospital encounter of 08/14/22 (from the past 24 hour(s))  CBC     Status: Abnormal   Collection Time: 08/18/22  3:16 AM  Result Value Ref Range   WBC 15.6 (H) 4.0 - 10.5 K/uL   RBC 3.21 (L) 3.87 - 5.11 MIL/uL   Hemoglobin 9.8 (L) 12.0 - 15.0 g/dL   HCT 31.0 (L) 36.0 - 46.0 %   MCV 96.6 80.0 - 100.0 fL   MCH 30.5 26.0 - 34.0 pg   MCHC 31.6 30.0 - 36.0 g/dL   RDW 14.5 11.5 - 15.5 %   Platelets 331 150 - 400 K/uL   nRBC 0.0 0.0 - 0.2 %  Basic metabolic panel     Status: Abnormal   Collection Time: 08/18/22  3:16 AM  Result Value Ref Range   Sodium 135 135 - 145 mmol/L   Potassium 3.9 3.5 - 5.1 mmol/L   Chloride 95 (L) 98 - 111 mmol/L   CO2 28 22 - 32 mmol/L   Glucose, Bld 121 (H) 70 - 99 mg/dL   BUN 24 (H) 8 - 23 mg/dL    Creatinine, Ser 1.33 (H) 0.44 - 1.00 mg/dL   Calcium 8.8 (L) 8.9 - 10.3 mg/dL   GFR, Estimated 42 (L) >60 mL/min   Anion gap 12 5 - 15     PHYSICAL EXAM:   Gen: sitting up in bed, NAD, appears well  Lungs: unlabored Cardiac: reg Ext:       Left Lower Extremity   LLS and dressing clean, dry and intact  Vac with good seal and suction   Skin looks good at proximal and distal aspects of splint  Ext warm   Minimal swelling   No pain with passive stretching of toes  Motor and sensory functions intact  + DP pulse  Assessment/Plan: 2 Days Post-Op   Principal Problem:   Periprosthetic fracture around internal prosthetic left knee joint Active Problems:   Neck pain, chronic   History of CVA (cerebrovascular accident)   Hyperlipidemia   Asthma   Esophageal reflux   Paroxysmal atrial fibrillation ([place])   History of DVT of lower extremity   SARS-CoV-2 positive   Neurocognitive deficits   Anti-infectives (From admission, onward)    Start     Dose/Rate Route Frequency Ordered Stop   08/16/22 1800  ceFAZolin (ANCEF) IVPB 2g/100 mL premix  Status:  Discontinued        2 g 200 mL/hr over 30 Minutes Intravenous Every 6 hours 08/16/22 1702 08/16/22 1704   08/16/22 1523  vancomycin (VANCOCIN) powder  Status:  Discontinued          As needed 08/16/22 1523 08/16/22 1531   08/16/22 0745  ceFAZolin (ANCEF) IVPB 2g/100 mL premix        2 g 200 mL/hr over 30 Minutes Intravenous On call to O.R. 08/16/22 0651 08/16/22 1321   08/14/22 2100  cefTRIAXone (ROCEPHIN) 1 g in sodium chloride 0.9 % 100 mL IVPB        1 g 200 mL/hr over 30 Minutes Intravenous Every 24 hours 08/14/22 2011 08/21/22 2059     .  POD/HD#: 2  76 y/o female Status post left total knee arthroplasty with fall and extensor mechanism rupture s/p I&D, poly exchange and patellar tendon repair with graft    -fall  - Status post left total knee arthroplasty with fall and extensor mechanism rupture s/p I&D, poly  exchange and patellar tendon repair with graft   TDWB L leg with knee in full extension (currently in long leg splint)  Toe motion as tolerated  PT/OT  SNF search   Ice and elevate     - Pain management:  Multimodal   Changed tylenol to scheduled    Norco as needed    - ABL anemia/Hemodynamics  Stable  - Medical issues   Per primary   - DVT/PE prophylaxis:  Eliquis   - ID:   NGTD on intra-op cultures  Continue to monitor   - Activity:  As above  - FEN/GI prophylaxis/Foley/Lines:  Reg diet   -Ex-fix/Splint care:  Maintain splint until ortho follow up   - Dispo:  Ortho issues stable  SNF when bed available  Follow up on final cultures     Jari Pigg, PA-C [phone] (C) 08/18/2022, 11:57 AM  [facility] [place] [address] [phone] Jenetta Downer) [phone] (F)    After 5pm and on the weekends please log on to Amion, go to orthopaedics and the look under the [facility] Call for the provider(s) on call. You can also call our office at [phone] and then follow the prompts to be connected to the call team.   Patient ID: Pearlie Oyster, female   DOB: 06-28-1946, 76 y.o.   MRN: [id number]

## 2022-08-18 NOTE — NC FL2 (Signed)
[place] LEVEL OF CARE SCREENING TOOL     IDENTIFICATION  Patient Name: [name] Birthdate: 1946-04-09 Sex: female Admission Date (Current Location): 08/14/2022  [facility] and [place] Number:  Herbalist and Address:  [facility] [place]. [facility], [place] [address] [place], [place] [address]      Provider Number: [id number]  Attending Physician Name and Address:  Shelly Coss, MD  Relative Name and Phone Number:  Constancia, Geeting (Spouse)   [phone]    Current Level of Care: Hospital Recommended Level of Care: [place] Prior Approval Number:    Date Approved/Denied:   PASRR Number: [id number]  Discharge Plan: SNF    Current Diagnoses: Patient Active Problem List   Diagnosis Date Noted   Neurocognitive deficits 08/14/2022   Periprosthetic fracture around internal prosthetic left knee joint 08/14/2022   SARS-CoV-2 positive 08/06/2022   Paroxysmal atrial fibrillation ([place]) 08/02/2022   History of DVT of lower extremity 08/02/2022   Localized osteoarthritis of left knee 07/22/2022   S/P hysterectomy 01/30/2021   Visit for pelvic exam 01/30/2021   Chronic constipation 01/30/2021   SUI (stress urinary incontinence, female) 01/30/2021   Low back pain without sciatica 05/11/2019   Arthritis 08/01/2018   Knee pain, chronic 08/01/2018   History of CVA (cerebrovascular accident) 08/01/2018   Hyperlipidemia 08/01/2018   Migraines 08/01/2018   Mechanical ptosis of bilateral eyelids 03/28/2017   Ptosis of eyelid, bilateral 03/28/2017   Nasal septal perforation 09/12/2016   Presbycusis of both ears 09/12/2016   Cognitive change 08/27/2016   Abnormal mammogram 09/07/2014   Allergic rhinitis 09/07/2014   Allergic urticaria 09/07/2014   Amaurosis fugax 09/07/2014   Asthma 09/07/2014   Benign neoplasm of colon 09/07/2014   Esophageal reflux 09/07/2014   Lesion of breast 09/07/2014   Sensorineural hearing loss (SNHL),  bilateral 09/07/2014   Tinnitus 09/07/2014   Neck pain, chronic 01/13/2014   Baker's cyst of knee 12/09/2013   Stiffness of joint, not elsewhere classified, other specified site 03/02/2013   Weakness of neck 03/02/2013    Orientation RESPIRATION BLADDER Height & Weight     Self, Place  Normal Continent, External catheter Weight: 190 lb 0.6 oz (86.2 kg) Height:  '5\' 5"'$  (165.1 cm)  BEHAVIORAL SYMPTOMS/MOOD NEUROLOGICAL BOWEL NUTRITION STATUS      Continent Diet (See DC Summary)  AMBULATORY STATUS COMMUNICATION OF NEEDS Skin   Extensive Assist Verbally Surgical wounds                       Personal Care Assistance Level of Assistance  Bathing, Feeding, Dressing Bathing Assistance: Maximum assistance Feeding assistance: Independent Dressing Assistance: Maximum assistance     Functional Limitations Info  Sight, Hearing, Speech Sight Info: Adequate Hearing Info: Impaired Speech Info: Adequate    SPECIAL CARE FACTORS FREQUENCY  PT (By licensed PT), OT (By licensed OT)     PT Frequency: 5x a week OT Frequency: 5x a week            Contractures Contractures Info: Not present    Additional Factors Info  Code Status, Allergies, Psychotropic Code Status Info: Full Code Allergies Info: Ticlid (Ticlopidine)   Nsaids   Prednisone   Monistat (Miconazole)   Sulfa Antibiotics Psychotropic Info: BuSpar, duloxetine         Current Medications (08/18/2022):  This is the current hospital active medication list Current Facility-Administered Medications  Medication Dose Route Frequency Provider Last Rate Last Admin  0.9 %  sodium chloride infusion   Intravenous Continuous Shelly Coss, MD 75 mL/hr at 08/18/22 0058 New Bag at 08/18/22 0058   acetaminophen (TYLENOL) tablet 325-650 mg  325-650 mg Oral Q6H PRN McBane, Maylene Roes, PA-C       alum & mag hydroxide-simeth (MAALOX/MYLANTA) 200-200-20 MG/5ML suspension 30 mL  30 mL Oral Q4H PRN McBane, Maylene Roes, PA-C       apixaban  (ELIQUIS) tablet 5 mg  5 mg Oral Q12H Shelly Coss, MD   5 mg at 08/17/22 2046   busPIRone (BUSPAR) tablet 30 mg  30 mg Oral BID Ethelda Chick, PA-C   30 mg at 08/17/22 2239   cefTRIAXone (ROCEPHIN) 1 g in sodium chloride 0.9 % 100 mL IVPB  1 g Intravenous Q24H Ethelda Chick, PA-C   Stopped at 08/17/22 2124   diltiazem (CARDIZEM) 125 mg in dextrose 5% 125 mL (1 mg/mL) infusion  5-15 mg/hr Intravenous Titrated Ethelda Chick, PA-C 5 mL/hr at 08/18/22 0315 5 mg/hr at 08/18/22 0315   diphenhydrAMINE (BENADRYL) 12.5 MG/5ML elixir 12.5-25 mg  12.5-25 mg Oral Q4H PRN McBane, Maylene Roes, PA-C       docusate sodium (COLACE) capsule 100 mg  100 mg Oral BID Ethelda Chick, PA-C   100 mg at 08/17/22 2239   DULoxetine (CYMBALTA) DR capsule 120 mg  120 mg Oral QHS Ethelda Chick, PA-C   120 mg at 08/17/22 2239   HYDROcodone-acetaminophen ([place]) 7.5-325 MG per tablet 1 tablet  1 tablet Oral Q4H PRN Ethelda Chick, PA-C   1 tablet at 08/17/22 1315   HYDROcodone-acetaminophen (NORCO/VICODIN) 5-325 MG per tablet 1 tablet  1 tablet Oral Q4H PRN Ethelda Chick, PA-C   1 tablet at 08/17/22 1047   LORazepam (ATIVAN) tablet 0.5 mg  0.5 mg Oral BID PRN Ethelda Chick, PA-C   0.5 mg at 08/17/22 0424   menthol-cetylpyridinium (CEPACOL) lozenge 3 mg  1 lozenge Oral PRN McBane, Maylene Roes, PA-C       Or   phenol (CHLORASEPTIC) mouth spray 1 spray  1 spray Mouth/Throat PRN McBane, Maylene Roes, PA-C       methocarbamol (ROBAXIN) tablet 500 mg  500 mg Oral Q6H PRN Ethelda Chick, PA-C   500 mg at 08/17/22 1048   Or   methocarbamol (ROBAXIN) 500 mg in dextrose 5 % 50 mL IVPB  500 mg Intravenous Q6H PRN McBane, Maylene Roes, PA-C       metoprolol tartrate (LOPRESSOR) tablet 12.5 mg  12.5 mg Oral BID Shelly Coss, MD       mirabegron ER (MYRBETRIQ) tablet 50 mg  50 mg Oral Daily Ethelda Chick, PA-C   50 mg at 08/17/22 1047   morphine (PF) 2 MG/ML injection 0.25-0.5 mg  0.25-0.5 mg  Intravenous Q4H PRN Ethelda Chick, PA-C   0.5 mg at 08/17/22 0424   ondansetron (ZOFRAN) tablet 4 mg  4 mg Oral Q6H PRN McBane, Maylene Roes, PA-C       Or   ondansetron (ZOFRAN) injection 4 mg  4 mg Intravenous Q6H PRN McBane, Caroline N, PA-C       pantoprazole (PROTONIX) EC tablet 40 mg  40 mg Oral Daily McBane, Caroline N, PA-C   40 mg at 08/17/22 1047   polyethylene glycol (MIRALAX / GLYCOLAX) packet 17 g  17 g Oral Daily Shelly Coss, MD   17 g at 08/17/22 1047   senna (SENOKOT) tablet 8.6 mg  1 tablet  Oral BID Shelly Coss, MD   8.6 mg at 08/17/22 2239   sodium chloride 0.9 % bolus 500 mL  500 mL Intravenous Once Shelly Coss, MD 500 mL/hr at 08/17/22 1620 Restarted at 08/17/22 1620   sodium chloride flush (NS) 0.9 % injection 3 mL  3 mL Intravenous Q12H Ethelda Chick, PA-C   3 mL at 08/17/22 2239   SUMAtriptan (IMITREX) tablet 100 mg  100 mg Oral Q2H PRN McBane, Maylene Roes, PA-C       zolpidem (AMBIEN) tablet 5 mg  5 mg Oral QHS PRN Ethelda Chick, PA-C         Discharge Medications: Please see discharge summary for a list of discharge medications.  Relevant Imaging Results:  Relevant Lab Results:   Additional Information SSN [id number]  Reece Agar, LCSWA

## 2022-08-18 NOTE — Anesthesia Postprocedure Evaluation (Signed)
Anesthesia Post Note  Patient: TANETTA FUHRIMAN  Procedure(s) Performed: IRRIGATION AND DEBRIDEMENT LEFT KNEE REVISION WITH PATELLAR TENDON REPAIR and poly exchange (Left: Knee) KNEE POLY EXCHANGE (Left: Knee)     Patient location during evaluation: Other Anesthesia Type: General Level of consciousness: awake and alert Pain management: pain level controlled Vital Signs Assessment: post-procedure vital signs reviewed and stable Respiratory status: spontaneous breathing, nonlabored ventilation, respiratory function stable and patient connected to nasal cannula oxygen Cardiovascular status: blood pressure returned to baseline and stable Postop Assessment: no apparent nausea or vomiting Anesthetic complications: no   No notable events documented.  Last Vitals:  Vitals:   08/18/22 1025 08/18/22 1239  BP:  (!) 100/59  Pulse:  78  Resp:  20  Temp: 36.9 C 36.8 C  SpO2:  99%    Last Pain:  Vitals:   08/18/22 1239  TempSrc: Oral  PainSc:                  Brittain Smithey,W. EDMOND

## 2022-08-19 ENCOUNTER — Other Ambulatory Visit: Payer: Self-pay

## 2022-08-19 ENCOUNTER — Encounter (HOSPITAL_COMMUNITY): Payer: Self-pay | Admitting: Internal Medicine

## 2022-08-19 ENCOUNTER — Ambulatory Visit (HOSPITAL_COMMUNITY): Payer: PPO

## 2022-08-19 ENCOUNTER — Telehealth (HOSPITAL_COMMUNITY): Payer: Self-pay | Admitting: Pharmacy Technician

## 2022-08-19 ENCOUNTER — Other Ambulatory Visit (HOSPITAL_COMMUNITY): Payer: Self-pay

## 2022-08-19 DIAGNOSIS — M9712XA Periprosthetic fracture around internal prosthetic left knee joint, initial encounter: Secondary | ICD-10-CM | POA: Diagnosis not present

## 2022-08-19 LAB — BASIC METABOLIC PANEL
Anion gap: 9 (ref 5–15)
BUN: 22 mg/dL (ref 8–23)
CO2: 28 mmol/L (ref 22–32)
Calcium: 8.9 mg/dL (ref 8.9–10.3)
Chloride: 99 mmol/L (ref 98–111)
Creatinine, Ser: 0.7 mg/dL (ref 0.44–1.00)
GFR, Estimated: >60 mL/min (ref >60)
Glucose, Bld: 107 mg/dL — ABNORMAL HIGH (ref 70–99)
Potassium: 3.9 mmol/L (ref 3.5–5.1)
Sodium: 136 mmol/L (ref 135–145)

## 2022-08-19 LAB — CBC
HCT: 31.9 % — ABNORMAL LOW (ref 36.0–46.0)
Hemoglobin: 10 g/dL — ABNORMAL LOW (ref 12.0–15.0)
MCH: 30.7 pg (ref 26.0–34.0)
MCHC: 31.3 g/dL (ref 30.0–36.0)
MCV: 97.9 fL (ref 80.0–100.0)
Platelets: 312 10*3/uL (ref 150–400)
RBC: 3.26 MIL/uL — ABNORMAL LOW (ref 3.87–5.11)
RDW: 14.5 % (ref 11.5–15.5)
WBC: 14.1 10*3/uL — ABNORMAL HIGH (ref 4.0–10.5)
nRBC: 0 % (ref 0.0–0.2)

## 2022-08-19 MED ORDER — APIXABAN 5 MG PO TABS
5.0000 mg | ORAL_TABLET | Freq: Two times a day (BID) | ORAL | Status: DC
  Administered 2022-08-19 – 2022-08-23 (×9): 5 mg via ORAL
  Filled 2022-08-19 (×9): qty 1

## 2022-08-19 MED ORDER — METOPROLOL TARTRATE 12.5 MG HALF TABLET
12.5000 mg | ORAL_TABLET | Freq: Once | ORAL | Status: AC
  Administered 2022-08-19: 12.5 mg via ORAL
  Filled 2022-08-19: qty 1

## 2022-08-19 MED ORDER — METOPROLOL TARTRATE 25 MG PO TABS
25.0000 mg | ORAL_TABLET | Freq: Two times a day (BID) | ORAL | Status: DC
  Administered 2022-08-19 – 2022-08-23 (×8): 25 mg via ORAL
  Filled 2022-08-19 (×8): qty 1

## 2022-08-19 NOTE — TOC Benefit Eligibility Note (Signed)
Patient Teacher, English as a foreign language completed.    The patient is currently admitted and upon discharge could be taking Eliquis 5 mg.  The current 30 day co-pay is $27.38.   The patient is insured through [place], [place] Patient Advocate Specialist [place] Patient Advocate Team Direct Number: [phone]  Fax: [phone]

## 2022-08-19 NOTE — Progress Notes (Signed)
     Subjective: Patient lying comfortably in bed this morning. Reports pain has been well controlled. Difficulty mobilizing over the weekend. Reports splint on the left leg is heavy. Encouraged her to continue to work on mobility and trying to get out of bed daily.  Objective:   VITALS:   Vitals:   08/18/22 1239 08/18/22 1736 08/18/22 2107 08/18/22 2322  BP: (!) 100/59 (!) 111/55 (!) 109/52 129/87  Pulse: 78   68  Resp: '20 14 16 17  '$ Temp: 98.2 F (36.8 C) 98.3 F (36.8 C) 98.5 F (36.9 C) 98.5 F (36.9 C)  TempSrc: Oral Oral Oral Oral  SpO2: 99% 97% 99% 99%  Weight:      Height:        Sensation intact distally Dorsiflexion/Plantar flexion intact Incision: prevena wound vac holding suction no output in cannister Compartment soft   Lab Results  Component Value Date   WBC 14.1 (H) 08/19/2022   HGB 10.0 (L) 08/19/2022   HCT 31.9 (L) 08/19/2022   MCV 97.9 08/19/2022   PLT 312 08/19/2022   BMET    Component Value Date/Time   NA 136 08/19/2022 0305   NA 140 05/27/2017 1553   K 3.9 08/19/2022 0305   CL 99 08/19/2022 0305   CO2 28 08/19/2022 0305   GLUCOSE 107 (H) 08/19/2022 0305   BUN 22 08/19/2022 0305   BUN 15 05/27/2017 1553   CREATININE 0.70 08/19/2022 0305   CALCIUM 8.9 08/19/2022 0305   GFRNONAA >60 08/19/2022 0305    Assessment/Plan: 3 Days Post-Op   Principal Problem:   Periprosthetic fracture around internal prosthetic left knee joint Active Problems:   Neck pain, chronic   History of CVA (cerebrovascular accident)   Hyperlipidemia   Asthma   Esophageal reflux   Paroxysmal atrial fibrillation (H[facility])   History of DVT of lower extremity   SARS-CoV-2 positive   Neurocognitive deficits  S/p repair of patellar tendon injury LLE 10/6 with Dr. Griffin Basil  Discussed intraop findings during surgery with patient and husband at bedside. Rupture of the patellar tendon partially of the tibial tubercle and inferior pole of the patella. Discussed augmented  hamstring repair with Dr. Griffin Basil. No signs of joint infection introap but did send tissue specimen for culture. Thorough debridement and irrigation was performed with poly exchange. New 64m poly reinserted. Overall tissues of poor quality so will plan for prolonged immobilization in extension. Currently in extension splint.   Post op recs: WB: TDWB AKJZ:PHXTAVWPon abx pending intraop cxs. Currently on ceftriaxone. NGTD Dressing: keep wound vac and splint intact.  DVT prophylaxis: eliquis, okay for full dose anticoagulation if indicated for A.fib. Follow up: 1 week post discharge for wound check Address[address] [place] [address] Office Phone: [phone] DKusilvak10/07/2022, 7:09 AM  DCharlies Constable MD  Contact information:   [phone]-5pm epic message Dr. MZachery Dakins or call office for patient follow up: [phone] After hours and holidays please check [email or web address] for group call information for [facility]

## 2022-08-19 NOTE — Telephone Encounter (Signed)
Pharmacy Patient Advocate Encounter  Insurance verification completed.    The patient is insured through [employer] Medicare Part D   The patient is currently admitted and ran test claims for the following: Eliquis.  Copays and coinsurance results were relayed to Inpatient clinical team.      

## 2022-08-19 NOTE — TOC Initial Note (Signed)
Transition of Care [facility]) - Initial/Assessment Note    Patient Details  Name: [name] MRN: [id number] Date of Birth: 08-24-1946  [facility] CM/SW Contact:    Joanne Chars, LCSW Phone Number: 08/19/2022, 2:38 PM  Clinical Narrative:     Pt oriented x2, CSW spoke with husband Danny by phone.  He confirmed that pt was in [facility] prior to admission to [facility], had been there around 15 days.  Discussed PT recommendation to return, he would like return to [facility]  Discussed copay days and husband verbalized understanding.    Referral sent to [facility], TC message left wit Marianna Fuss.  CSW spoke with Tammy[employer] and requested insurance auth.                Expected Discharge Plan: [place] Barriers to Discharge: Continued Medical Work up   Patient Goals and CMS Choice     Choice offered to / list presented to : Spouse (husband [place])  Expected Discharge Plan and Services Expected Discharge Plan: [place] In-house Referral: Clinical Social Work   Post Acute Care Choice: [place] Living arrangements for the past 2 months: [place]                                      Prior Living Arrangements/Services Living arrangements for the past 2 months: Single Family Home Lives with:: Spouse Patient language and need for interpreter reviewed:: No        Need for Family Participation in Patient Care: Yes (Comment) Care giver support system in place?: Yes (comment) Current home services: Other (comment) (none) Criminal Activity/Legal Involvement Pertinent to Current Situation/Hospitalization: No - Comment as needed  Activities of Daily Living Home Assistive Devices/Equipment: Walker (specify type) ADL Screening (condition at time of admission) Patient's cognitive ability adequate to safely complete daily activities?: Yes Is the patient deaf or have difficulty hearing?:  No Does the patient have difficulty seeing, even when wearing glasses/contacts?: No Does the patient have difficulty concentrating, remembering, or making decisions?: Yes Patient able to express need for assistance with ADLs?: Yes Does the patient have difficulty dressing or bathing?: No Independently performs ADLs?: Yes (appropriate for developmental age) Does the patient have difficulty walking or climbing stairs?: No Weakness of Legs: Both Weakness of Arms/Hands: Both  Permission Sought/Granted                  Emotional Assessment       Orientation: : Oriented to Self, Oriented to Place      Admission diagnosis:  Periprosthetic fracture around internal prosthetic left knee joint [D40.81KG] Patient Active Problem List   Diagnosis Date Noted   Neurocognitive deficits 08/14/2022   Periprosthetic fracture around internal prosthetic left knee joint 08/14/2022   SARS-CoV-2 positive 08/06/2022   Paroxysmal atrial fibrillation ([place]) 08/02/2022   History of DVT of lower extremity 08/02/2022   Localized osteoarthritis of left knee 07/22/2022   S/P hysterectomy 01/30/2021   Visit for pelvic exam 01/30/2021   Chronic constipation 01/30/2021   SUI (stress urinary incontinence, female) 01/30/2021   Low back pain without sciatica 05/11/2019   Arthritis 08/01/2018   Knee pain, chronic 08/01/2018   History of CVA (cerebrovascular accident) 08/01/2018   Hyperlipidemia 08/01/2018   Migraines 08/01/2018   Mechanical ptosis of bilateral eyelids 03/28/2017   Ptosis of eyelid, bilateral 03/28/2017  Nasal septal perforation 09/12/2016   Presbycusis of both ears 09/12/2016   Cognitive change 08/27/2016   Abnormal mammogram 09/07/2014   Allergic rhinitis 09/07/2014   Allergic urticaria 09/07/2014   Amaurosis fugax 09/07/2014   Asthma 09/07/2014   Benign neoplasm of colon 09/07/2014   Esophageal reflux 09/07/2014   Lesion of breast 09/07/2014   Sensorineural hearing loss (SNHL),  bilateral 09/07/2014   Tinnitus 09/07/2014   Neck pain, chronic 01/13/2014   Baker's cyst of knee 12/09/2013   Stiffness of joint, not elsewhere classified, other specified site 03/02/2013   Weakness of neck 03/02/2013   PCP:  Asencion Noble, MD Pharmacy:   [facility], [place] [address] [place] [address] Phone: [phone] Fax: [place], [place] [address] [place] [address] Phone: [phone] Fax: [place], [place] [address] Phone: [phone] Fax: [phone]     Social Determinants of Health (SDOH) Interventions Food Insecurity Interventions: Intervention Not Indicated Housing Interventions: Intervention Not Indicated Transportation Interventions: Intervention Not Indicated Utilities Interventions: Intervention Not Indicated  Readmission Risk Interventions     No data to display

## 2022-08-19 NOTE — Consult Note (Signed)
Cardiology Consultation   Patient ID: [name] MRN: [id number]; DOB: 1946-01-19  Admit date: 08/14/2022 Date of Consult: 08/19/2022  PCP:  Asencion Noble, [facility] Cardiologist:  None        Patient Profile:   [name] is a 76 y.o. female with a hx of chronic pain, CVA, HLD, stress incontinence, asthma, GERD, atrial fib, hx DVT,  neurocognitive deficits, depression, anxiety, admitted 08/14/22 for knee fracture who is being seen 08/19/2022 for the evaluation of atrial fib at the request of Dr. Tawanna Solo.  History of Present Illness:   Ms. Pidgeon with above hx and was admitted 08/14/22 with knee Fx.  She had Left total knee replacement 07/22/22 and has been recovering at rehab facility.  She fell at rehab facility and fractured her patella and her knee around the prosthetic.  She was admitted by Ortho to increase medical optimization prior to surgery with recent diagnosis of COVID 08/05/22.  She completed outpt antiviral medication and had normal CXR.  No longer on precautions.     Hx of PAF and has been on Eliquis.     Though does not appear she has had in some time 07/01/22 on H&P not listed nor on discharge summary.  She does not remember if she had atrial fib.  No cardiac hx except for a murmur due to  Psittacosis.  No chest pain or SOB.  She was on ASA.      08/16/22 she underwent  Left revision total knee arthroplasty 1 component Left patellar tendon repair with graft Left medial retinacular repair Left lateral retinacular repair   ABX were continued for suspected cellulitis.     Pt was in atrial fib post op and placed on IV dilt drip.  She had not been on rate lowering meds prior to surgery.   She has been changed from IV dilt to low dose metoprolol 25 mg BID.Marland Kitchen  Also placed on eliquis for CHA2DS2VASc of 5.    EKG:  The EKG was personally reviewed and demonstrates:  9/7 SR with RBBB and no acute changes from 2017 except RBBB.  EKG in atrial fib is  pending.  Today's EKG atrial fib rate of 87 with RBBB no acute ST changes Telemetry:  Telemetry was personally reviewed and demonstrates:  atrial fib with RVR at times.  But mostly rate controlled.  Echo was done 08/15/22 with EF 55-60%, LA mod dilated, RA normal. trivial MR.     Today labs Na 136, k+ 3.9 BUN 22, Cr 0.70 down from 1.33 GFR >60 today was 42. Hgb 10 WBC 14.1 plts 312. PCXR 08/17/22 with no acute process.  BP 118/73 P 115 R 15 afebrile.   Past Medical History:  Diagnosis Date   Anemia    Arthritis    Asthma    Atrial fibrillation ([place])    COVID-19 08/05/2022   Depression    H/O head injury    Hearing loss    Heart murmur    can be heard at times, Doctor said not to worry   History of kidney stones    History of stomach ulcers    Migraine    Pneumonia    Psittacosis    Stroke [facility])     Past Surgical History:  Procedure Laterality Date   ANKLE FRACTURE SURGERY Left    APPENDECTOMY     BREAST LUMPECTOMY     CESAREAN SECTION     x 3  I & D KNEE WITH POLY EXCHANGE Left 08/16/2022   Procedure: KNEE POLY EXCHANGE;  Surgeon: Hiram Gash, MD;  Location: [place];  Service: Orthopedics;  Laterality: Left;   KNEE ARTHROSCOPY WITH PATELLAR TENDON REPAIR Left 08/16/2022   Procedure: IRRIGATION AND DEBRIDEMENT LEFT KNEE REVISION WITH PATELLAR TENDON REPAIR and poly exchange;  Surgeon: Hiram Gash, MD;  Location: [place];  Service: Orthopedics;  Laterality: Left;   PARTIAL HYSTERECTOMY     ROTATOR CUFF REPAIR Right    TOTAL KNEE ARTHROPLASTY Left 07/22/2022   Procedure: TOTAL KNEE ARTHROPLASTY;  Surgeon: [name], MD;  Location: [facility];  Service: Orthopedics;  Laterality: Left;     Home Medications:  Prior to Admission medications   Medication Sig Start Date End Date Taking? Authorizing Provider  acetaminophen (TYLENOL) 500 MG tablet Take 1,000 mg by mouth every 6 (six) hours as needed for headache, moderate pain or mild pain.   Yes [provider]   busPIRone (BUSPAR) 30 MG tablet Take 1 tablet (30 mg total) by mouth 2 (two) times daily. 06/18/22 09/16/22 Yes Thayer Headings, PMHNP  Cholecalciferol (VITAMIN D) 125 MCG (5000 UT) CAPS Take 5,000 Units by mouth daily.   Yes [provider]  DULoxetine (CYMBALTA) 60 MG capsule Take 1 capsule (60 mg total) by mouth 2 (two) times daily. Patient taking differently: Take 120 mg by mouth at bedtime. 04/03/22 09/30/22 Yes Thayer Headings, PMHNP  eszopiclone (LUNESTA) 2 MG TABS tablet Take immediately before bedtime 08/05/22  Yes Medina-Vargas, Monina C, NP  LORazepam (ATIVAN) 0.5 MG tablet Take 1 tablet (0.5 mg total) by mouth 2 (two) times daily as needed for anxiety. 07/30/22  Yes Medina-Vargas, Monina C, NP  MYRBETRIQ 50 MG TB24 tablet Take 50 mg by mouth daily. 02/13/22  Yes [provider]  SUMAtriptan (IMITREX) 100 MG tablet Take 100 mg by mouth every 2 (two) hours as needed for migraine or headache. May repeat in 2 hours if headache persists or recurs.   Yes [provider]    Inpatient Medications: Scheduled Meds:  acetaminophen  650 mg Oral Q8H   apixaban  5 mg Oral BID   busPIRone  30 mg Oral BID   docusate sodium  100 mg Oral BID   DULoxetine  120 mg Oral QHS   metoprolol tartrate  25 mg Oral BID   mirabegron ER  50 mg Oral Daily   pantoprazole  40 mg Oral Daily   polyethylene glycol  17 g Oral Daily   senna  1 tablet Oral BID   sodium chloride flush  3 mL Intravenous Q12H   Continuous Infusions:  cefTRIAXone (ROCEPHIN)  IV Stopped (08/18/22 2118)   methocarbamol (ROBAXIN) IV     sodium chloride 500 mL/hr at 08/19/22 0334   PRN Meds: alum & mag hydroxide-simeth, diphenhydrAMINE, HYDROcodone-acetaminophen, HYDROcodone-acetaminophen, LORazepam, menthol-cetylpyridinium **OR** phenol, methocarbamol **OR** methocarbamol (ROBAXIN) IV, morphine injection, ondansetron **OR** ondansetron (ZOFRAN) IV, SUMAtriptan, zolpidem  Allergies:    Allergies  Allergen  Reactions   Ticlid [Ticlopidine] Shortness Of Breath   Nsaids Other (See Comments)    Bleeding ulcer   Prednisone Other (See Comments)    Makes her stomach hurt--knows this isn't an allergy but doesn't want to take it    Monistat [Miconazole] Swelling and Rash   Sulfa Antibiotics Rash    Social History:   Social History   Socioeconomic History   Marital status: Married    Spouse name: Not on file   Number of  children: 3   Years of education: Not on file   Highest education level: Not on file  Occupational History   Occupation: retired  Tobacco Use   Smoking status: Never   Smokeless tobacco: Never  Vaping Use   Vaping Use: Never used  Substance and Sexual Activity   Alcohol use: Yes    Comment: occ beer   Drug use: No   Sexual activity: Yes    Birth control/protection: Surgical    Comment: hyst  Other Topics Concern   Not on file  Social History Narrative   Not on file   Social Determinants of Health   Financial Resource Strain: Medium Risk (01/30/2021)   Overall Financial Resource Strain (CARDIA)    Difficulty of Paying Living Expenses: Somewhat hard  Food Insecurity: No Food Insecurity (08/15/2022)   Hunger Vital Sign    Worried About Running Out of Food in the Last Year: Never true    Ran Out of Food in the Last Year: Never true  Transportation Needs: No Transportation Needs (08/15/2022)   PRAPARE - Hydrologist (Medical): No    Lack of Transportation (Non-Medical): No  Physical Activity: Insufficiently Active (01/30/2021)   Exercise Vital Sign    Days of Exercise per Week: 2 days    Minutes of Exercise per Session: 10 min  Stress: No Stress Concern Present (01/30/2021)   [place]    Feeling of Stress : Only a little  Social Connections: Moderately Integrated (01/30/2021)   Social Connection and Isolation Panel [NHANES]    Frequency of Communication with Friends  and Family: Twice a week    Frequency of Social Gatherings with Friends and Family: Twice a week    Attends Religious Services: More than 4 times per year    Active Member of [employer] or Organizations: No    Attends Archivist Meetings: Never    Marital Status: Married  Human resources officer Violence: Not At Risk (08/15/2022)   Humiliation, Afraid, Rape, and Kick questionnaire    Fear of Current or Ex-Partner: No    Emotionally Abused: No    Physically Abused: No    Sexually Abused: No    Family History:    Family History  Problem Relation Age of Onset   Cancer Father    Heart disease Brother    Alcohol abuse Brother    Depression Maternal Grandmother    Depression Grandchild    Breast cancer Daughter      ROS:  Please see the history of present illness.  General:no colds or fevers, no weight changes Skin:no rashes or ulcers HEENT:no blurred vision, no congestion CV:see HPI PUL:see HPI GI:no diarrhea constipation or melena, no indigestion GU:no hematuria, no dysuria MS:+ fx patella post total knee, no claudication Neuro:no syncope, no lightheadedness Endo:no diabetes, no thyroid disease  All other ROS reviewed and negative.     Physical Exam/Data:   Vitals:   08/18/22 1736 08/18/22 2107 08/18/22 2322 08/19/22 0900  BP: (!) 111/55 (!) 109/52 129/87 118/73  Pulse:   68 (!) 104  Resp: '14 16 17 16  '$ Temp: 98.3 F (36.8 C) 98.5 F (36.9 C) 98.5 F (36.9 C) 98.1 F (36.7 C)  TempSrc: Oral Oral Oral Oral  SpO2: 97% 99% 99% 97%  Weight:      Height:        Intake/Output Summary (Last 24 hours) at 08/19/2022 1238 Last data filed at 08/19/2022 1050  Gross per 24 hour  Intake 15847.58 ml  Output 400 ml  Net 15447.58 ml      08/16/2022   10:54 AM 08/16/2022    5:54 AM 08/05/2022    2:01 PM  Last 3 Weights  Weight (lbs) 190 lb 0.6 oz 190 lb 0.6 oz 206 lb 14.4 oz  Weight (kg) 86.2 kg 86.2 kg 93.849 kg     Body mass index is 31.62 kg/m.  General:  Well  nourished, well developed, in no acute distress HEENT: normal Neck: no JVD Vascular: No carotid bruits; Distal pulses 2+ bilaterally Cardiac:  normal S1, S2; RRR; no murmur gallup rub or click Lungs:  clear to auscultation bilaterally, ant. Position no wheezing, rhonchi or rales  Abd: soft, nontender, no hepatomegaly  Ext: no edema except Lt leg with knee, + drain Musculoskeletal:  No deformities, BUE and BLE strength normal and equal Skin: warm and dry  Neuro:  alert and oriented X 3 , , MAE follows commands no focal abnormalities noted Psych:  Normal affect    Relevant CV Studies: Echo 08/15/22 IMPRESSIONS     1. Poor quality exam. Consider repeat exam when HR is better controlled.   2. Left ventricular ejection fraction, by estimation, is 55 to 60%. The  left ventricle has normal function. Left ventricular endocardial border  not optimally defined to evaluate regional wall motion. Left ventricular  diastolic parameters are  indeterminate. Definity contrast not used.   3. Right ventricular systolic function is normal. The right ventricular  size is normal. There is normal pulmonary artery systolic pressure.   4. Left atrial size was moderately dilated.   5. The mitral valve is grossly normal. Trivial mitral valve  regurgitation. No evidence of mitral stenosis.   6. The aortic valve was not well visualized. Aortic valve regurgitation  is not visualized. Aortic valve sclerosis is present, with no evidence of  aortic valve stenosis.   7. The inferior vena cava is normal in size with <50% respiratory  variability, suggesting right atrial pressure of 8 mmHg.   FINDINGS   Left Ventricle: Left ventricular ejection fraction, by estimation, is 55  to 60%. The left ventricle has normal function. Left ventricular  endocardial border not optimally defined to evaluate regional wall motion.  The left ventricular internal cavity  size was normal in size. There is no left ventricular  hypertrophy. Left  ventricular diastolic parameters are indeterminate.   Right Ventricle: The right ventricular size is normal. No increase in  right ventricular wall thickness. Right ventricular systolic function is  normal. There is normal pulmonary artery systolic pressure. The tricuspid  regurgitant velocity is 2.19 m/s, and   with an assumed right atrial pressure of 8 mmHg, the estimated right  ventricular systolic pressure is 36.6 mmHg.   Left Atrium: Left atrial size was moderately dilated.   Right Atrium: Right atrial size was normal in size.   Pericardium: There is no evidence of pericardial effusion.   Mitral Valve: The mitral valve is grossly normal. Trivial mitral valve  regurgitation. No evidence of mitral valve stenosis.   Tricuspid Valve: The tricuspid valve is not well visualized. Tricuspid  valve regurgitation is trivial. No evidence of tricuspid stenosis.   Aortic Valve: The aortic valve was not well visualized. Aortic valve  regurgitation is not visualized. Aortic valve sclerosis is present, with  no evidence of aortic valve stenosis. Aortic valve mean gradient measures  7.0 mmHg. Aortic valve peak gradient  measures 13.1 mmHg. Aortic  valve area, by VTI measures 2.00 cm.   Pulmonic Valve: The pulmonic valve was not assessed. Pulmonic valve  regurgitation is not visualized. No evidence of pulmonic stenosis.   Aorta: The ascending aorta was not well visualized.   Venous: The inferior vena cava is normal in size with less than 50%  respiratory variability, suggesting right atrial pressure of 8 mmHg.   IAS/Shunts: No atrial level shunt detected by color flow Doppler.       Laboratory Data:  High Sensitivity Troponin:  No results for input(s): "TROPONINIHS" in the last 720 hours.   Chemistry Recent Labs  Lab 08/17/22 0221 08/18/22 0316 08/19/22 0305  NA 135 135 136  K 3.8 3.9 3.9  CL 93* 95* 99  CO2 '28 28 28  '$ GLUCOSE 132* 121* 107*  BUN 13 24* 22   CREATININE 1.32* 1.33* 0.70  CALCIUM 8.8* 8.8* 8.9  GFRNONAA 42* 42* >60  ANIONGAP '14 12 9    '$ No results for input(s): "PROT", "ALBUMIN", "AST", "ALT", "ALKPHOS", "BILITOT" in the last 168 hours. Lipids No results for input(s): "CHOL", "TRIG", "HDL", "LABVLDL", "LDLCALC", "CHOLHDL" in the last 168 hours.  Hematology Recent Labs  Lab 08/17/22 0221 08/18/22 0316 08/19/22 0305  WBC 16.9* 15.6* 14.1*  RBC 3.56* 3.21* 3.26*  HGB 10.8* 9.8* 10.0*  HCT 34.2* 31.0* 31.9*  MCV 96.1 96.6 97.9  MCH 30.3 30.5 30.7  MCHC 31.6 31.6 31.3  RDW 14.2 14.5 14.5  PLT 387 331 312   Thyroid No results for input(s): "TSH", "FREET4" in the last 168 hours.  BNP Recent Labs  Lab 08/15/22 1207  BNP 154.2*    DDimer No results for input(s): "DDIMER" in the last 168 hours.   Radiology/Studies:  DG CHEST PORT 1 VIEW  Result Date: 08/16/2022 CLINICAL DATA:  Short of breath EXAM: PORTABLE CHEST 1 VIEW COMPARISON:  Three hundred eighteen FINDINGS: Single frontal view of the chest demonstrates an unremarkable cardiac silhouette. Chronic elevation of the left hemidiaphragm. No acute airspace disease, effusion, or pneumothorax. No acute bony abnormality. IMPRESSION: 1. No acute intrathoracic process. Electronically Signed   By: Randa Ngo M.D.   On: 08/16/2022 20:39   ECHOCARDIOGRAM COMPLETE  Result Date: 08/16/2022    ECHOCARDIOGRAM REPORT   Patient Name:   TONDALAYA PERREN Date of Exam: 08/15/2022 Medical Rec #:  [id number]     Height:       65.0 in Accession #:    [id number]    Weight:       206.9 lb Date of Birth:  1946/10/25    BSA:          2.007 m Patient Age:    75 years      BP:           121/82 mmHg Patient Gender: F             HR:           115 bpm. Exam Location:  Inpatient Procedure: 2D Echo, Color Doppler and Cardiac Doppler Indications:    CHF  History:        Patient has no prior history of Echocardiogram examinations.                 Stroke; Signs/Symptoms:Murmur.  Sonographer:    Memory Argue Referring Phys: [id number] AMRIT ADHIKARI  Sonographer Comments: Technically difficult study due to poor echo windows. IMPRESSIONS  1. Poor quality exam. Consider repeat exam when HR is better controlled.  2. Left ventricular  ejection fraction, by estimation, is 55 to 60%. The left ventricle has normal function. Left ventricular endocardial border not optimally defined to evaluate regional wall motion. Left ventricular diastolic parameters are indeterminate. Definity contrast not used.  3. Right ventricular systolic function is normal. The right ventricular size is normal. There is normal pulmonary artery systolic pressure.  4. Left atrial size was moderately dilated.  5. The mitral valve is grossly normal. Trivial mitral valve regurgitation. No evidence of mitral stenosis.  6. The aortic valve was not well visualized. Aortic valve regurgitation is not visualized. Aortic valve sclerosis is present, with no evidence of aortic valve stenosis.  7. The inferior vena cava is normal in size with <50% respiratory variability, suggesting right atrial pressure of 8 mmHg. FINDINGS  Left Ventricle: Left ventricular ejection fraction, by estimation, is 55 to 60%. The left ventricle has normal function. Left ventricular endocardial border not optimally defined to evaluate regional wall motion. The left ventricular internal cavity size was normal in size. There is no left ventricular hypertrophy. Left ventricular diastolic parameters are indeterminate. Right Ventricle: The right ventricular size is normal. No increase in right ventricular wall thickness. Right ventricular systolic function is normal. There is normal pulmonary artery systolic pressure. The tricuspid regurgitant velocity is 2.19 m/s, and  with an assumed right atrial pressure of 8 mmHg, the estimated right ventricular systolic pressure is 16.1 mmHg. Left Atrium: Left atrial size was moderately dilated. Right Atrium: Right atrial size was normal in size.  Pericardium: There is no evidence of pericardial effusion. Mitral Valve: The mitral valve is grossly normal. Trivial mitral valve regurgitation. No evidence of mitral valve stenosis. Tricuspid Valve: The tricuspid valve is not well visualized. Tricuspid valve regurgitation is trivial. No evidence of tricuspid stenosis. Aortic Valve: The aortic valve was not well visualized. Aortic valve regurgitation is not visualized. Aortic valve sclerosis is present, with no evidence of aortic valve stenosis. Aortic valve mean gradient measures 7.0 mmHg. Aortic valve peak gradient measures 13.1 mmHg. Aortic valve area, by VTI measures 2.00 cm. Pulmonic Valve: The pulmonic valve was not assessed. Pulmonic valve regurgitation is not visualized. No evidence of pulmonic stenosis. Aorta: The ascending aorta was not well visualized. Venous: The inferior vena cava is normal in size with less than 50% respiratory variability, suggesting right atrial pressure of 8 mmHg. IAS/Shunts: No atrial level shunt detected by color flow Doppler.  LEFT VENTRICLE PLAX 2D LVIDd:         4.00 cm LVIDs:         2.60 cm LV PW:         1.00 cm LV IVS:        1.00 cm LVOT diam:     2.00 cm LV SV:         55 LV SV Index:   28 LVOT Area:     3.14 cm  RIGHT VENTRICLE TAPSE (M-mode): 2.0 cm LEFT ATRIUM             Index        RIGHT ATRIUM           Index LA diam:        3.20 cm 1.59 cm/m   RA Area:     15.10 cm LA Vol (A2C):   80.8 ml 40.24 ml/m  RA Volume:   38.50 ml  19.18 ml/m LA Vol (A4C):   95.9 ml 47.79 ml/m LA Biplane Vol: 95.6 ml 47.64 ml/m  AORTIC VALVE AV Area (Vmax):  1.89 cm AV Area (Vmean):   1.92 cm AV Area (VTI):     2.00 cm AV Vmax:           181.00 cm/s AV Vmean:          124.000 cm/s AV VTI:            0.277 m AV Peak Grad:      13.1 mmHg AV Mean Grad:      7.0 mmHg LVOT Vmax:         109.00 cm/s LVOT Vmean:        75.800 cm/s LVOT VTI:          0.176 m LVOT/AV VTI ratio: 0.64  AORTA Ao Root diam: 3.60 cm MR Peak grad: 37.5 mmHg    TRICUSPID VALVE MR Vmax:      306.00 cm/s TR Peak grad:   19.2 mmHg                           TR Vmax:        219.00 cm/s                            SHUNTS                           Systemic VTI:  0.18 m                           Systemic Diam: 2.00 cm Cherlynn Kaiser MD Electronically signed by Cherlynn Kaiser MD Signature Date/Time: 08/16/2022/6:03:32 AM    Final      Assessment and Plan:   Atrial fib with RVR post knee surgery.  Was hypotensive initially.  Hx of PAF.  She has not been on eliquis for at least a month.  Now is on eliquis with CHA2DS2VASC score of 4.  She is on lopressor 25 BID, after being on dilt drip post OR.  Now rate controlled.   Echo with normal EF.  No angina.  Continue Eliquis and follow up in office in 2-3 weeks post discharge to arrange DCCV.  As long as HR controlled in atrial fib.   She could follow up with Dr. Dellia Cloud in [place] office.  Moniotr CBC on eliquis post op.  Prosthetic fracture of Lt kee joint after Lt total knee replacement 07/22/22 now post Left revision total knee arthroplasty 1 component Left patellar tendon repair with graft Left medial retinacular repair Left lateral retinacular repair on the 6th of Oct.  Followed by Ortho 3.  Cellulitis with leukocytosis on ABX per IM and Ortho. 4. Hx of CVA. 5.  Bilateral Lower ext edema now improved with lasix.    Risk Assessment/Risk Scores:          CHA2DS2-VASc Score = 4   This indicates a 4.8% annual risk of stroke. The patient's score is based upon: CHF History: 0 HTN History: 0 Diabetes History: 0 Stroke History: 2 Vascular Disease History: 0 Age Score: 1 Gender Score: 1         For questions or updates, please contact [place] Please consult [email or web address] for contact info under    Signed, Cecilie Kicks, NP  08/19/2022 12:38 PM

## 2022-08-19 NOTE — Progress Notes (Signed)
PROGRESS NOTE  [name]  ENI:[id number] DOB: 1946/10/04 DOA: 08/14/2022 PCP: Asencion Noble, MD   Brief Narrative:  Patient is a 76 female with history of chronic pain syndrome, stroke, hyperlipidemia, stress incontinence, asthma, GERD, A-fib, DVT, depression, anxiety who presented from orthopedics office for management of left knee fracture.  She had a left total knee replacement on September 11 and was recovering at rehab facility but had a fall while at rehab facility and fractured her patella and her knee .  She was recently diagnosed with COVID on September 25 and was weak and lightheaded and fell.  No evidence of pneumonia as per chest x-ray that was done as an outpatient, she completed outpatient antiviral medication.  Orthopedics following, status post revision of total left knee arthroplasty, tendon repair with graft on 10/6.  Hospital course remarkable for new onset A-fib, cardiology following  Assessment & Plan:  Principal Problem:   Periprosthetic fracture around internal prosthetic left knee joint Active Problems:   Neck pain, chronic   History of CVA (cerebrovascular accident)   Hyperlipidemia   Asthma   Esophageal reflux   Paroxysmal atrial fibrillation (HCC)   History of DVT of lower extremity   SARS-CoV-2 positive   Neurocognitive deficits  Prosthetic fracture of left knee joint: Fell at [facility].  Status post left total knee replacement on September 11.  Orthopedics following. status post revision of total left knee arthroplasty, tendon repair with graft on 10/6. PT/OT recommending SNF on discharge. Postoperatively, she was requiring oxygen for maintenance of saturation.  Currently on room air.  Chest x-ray did not show any significant findings.  Plan for discharge to SNF as per PT/OT  Suspected cellulitis/leukocytosis: Left lower extremity also appeared erythematous, warm on admission.  No fever.  Started on ceftriaxone with significant  improvement.  Developed   leukocytosis .  Aerobic/anaerobic culture sent on 10/6 is pending without any growth.  Plan for antibiotics for 7 days course,day 6/7  Paroxysmal A-fib: As per the husband, she was diagnosed with paroxysmal A-fib at rehab but not discharged on any medications.  Not on rate control meds.  Went into A-fib with RVR after surgery, treated with Cardizem drip.  Started  on low-dose metoprolol.  Also started on Eliquis,low dose.CHA2DS2VASc of 5.  Cardiology consulted.  Echo showed EF of 55 to 60%, indeterminate left ventricular diastolic parameters  Hypotension/AKI: Postoperatively she went into A-fib, became hypotensive and creatinine also trended up.  Currently normotensive.  AKI resolved  COVID infection: Tested  positive on September 25.  Completed course of antiviral.  On room air.  Lifted precautions  History of CVA/hyperlipidemia: Not on anything at home  Stress incontinence: Continue home Myrbetriq  Neurocognitive deficits: Continue supportive care , delirium precautions.She is mostly oriented  Depression/anxiety/insomnia: Continue home BuSpar, duloxetine  Bilateral lower extremity edema:  BNP elevated.  Echo showed EF of 55 to 60%, indeterminate diastolic parameters.Significantly improved with lasix.          DVT prophylaxis:SCDs Start: 08/16/22 1703 SCDs Start: 08/14/22 1906 apixaban (ELIQUIS) tablet 5 mg     Code Status: Full Code  Family Communication: Called husband [name] on phone on 10/8  Patient status:Inpatient  Patient is from :SNF  Anticipated discharge to:SnF  Estimated DC date: After better heart rate control, SNF auth pending   Consultants: Orthopedics, cardiology  Procedures: As above  Antimicrobials:  Anti-infectives (From admission, onward)    Start     Dose/Rate Route Frequency Ordered Stop   08/16/22 1800  ceFAZolin (ANCEF) IVPB 2g/100 mL premix  Status:  Discontinued        2 g 200 mL/hr over 30 Minutes Intravenous Every 6 hours 08/16/22 1702  08/16/22 1704   08/16/22 1523  vancomycin (VANCOCIN) powder  Status:  Discontinued          As needed 08/16/22 1523 08/16/22 1531   08/16/22 0745  ceFAZolin (ANCEF) IVPB 2g/100 mL premix        2 g 200 mL/hr over 30 Minutes Intravenous On call to O.R. 08/16/22 0651 08/16/22 1321   08/14/22 2100  cefTRIAXone (ROCEPHIN) 1 g in sodium chloride 0.9 % 100 mL IVPB        1 g 200 mL/hr over 30 Minutes Intravenous Every 24 hours 08/14/22 2011 08/21/22 2059       Subjective: Patient seen and examined at the bedside today.  Hemodynamically stable.  Heart rate ranging from 9215.  Blood pressure is good.  Denies any chest pain, shortness of breath or any other complaints.  Objective: Vitals:   08/18/22 1736 08/18/22 2107 08/18/22 2322 08/19/22 0900  BP: (!) 111/55 (!) 109/52 129/87 118/73  Pulse:   68 (!) 104  Resp: '14 16 17 16  '$ Temp: 63.3 F (36.8 C) 98.5 F (36.9 C) 98.5 F (36.9 C) 98.1 F (36.7 C)  TempSrc: Oral Oral Oral Oral  SpO2: 97% 99% 99% 97%  Weight:      Height:        Intake/Output Summary (Last 24 hours) at 08/19/2022 1250 Last data filed at 08/19/2022 1050 Gross per 24 hour  Intake 15847.58 ml  Output 400 ml  Net 15447.58 ml   Filed Weights   08/16/22 0554 08/16/22 1054  Weight: 86.2 kg 86.2 kg    Examination:       General exam: Overall comfortable, not in distress, pleasantly confused elderly female HEENT: PERRL Respiratory system:  no wheezes or crackles  Cardiovascular system:.  Irregularly irregular rhythm Gastrointestinal system: Abdomen is nondistended, soft and nontender. Central nervous system: Alert and awake, obese commands, not oriented to time Extremities: No edema, no clubbing ,no cyanosis, dressing on the right knee, with wound VAC Skin: No rashes, no ulcers,no icterus     Data Reviewed: I have personally reviewed following labs and imaging studies  CBC: Recent Labs  Lab 08/14/22 2032 08/15/22 0254 08/17/22 0221 08/18/22 0316  08/19/22 0305  WBC 9.3 7.1 16.9* 15.6* 14.1*  HGB 10.5* 10.4* 10.8* 9.8* 10.0*  HCT 31.8* 31.1* 34.2* 31.0* 31.9*  MCV 94.9 94.5 96.1 96.6 97.9  PLT 324 319 387 331 354   Basic Metabolic Panel: Recent Labs  Lab 08/15/22 0254 08/16/22 0241 08/17/22 0221 08/18/22 0316 08/19/22 0305  NA 138 139 135 135 136  K 3.7 3.6 3.8 3.9 3.9  CL 101 98 93* 95* 99  CO2 '27 30 28 28 28  '$ GLUCOSE 99 103* 132* 121* 107*  BUN '8 9 13 '$ 24* 22  CREATININE 0.60 0.69 1.32* 1.33* 0.70  CALCIUM 9.0 9.1 8.8* 8.8* 8.9     Recent Results (from the past 240 hour(s))  Surgical pcr screen     Status: None   Collection Time: 08/16/22  7:00 AM   Specimen: Nasal Mucosa; Nasal Swab  Result Value Ref Range Status   MRSA, PCR NEGATIVE NEGATIVE Final   Staphylococcus aureus NEGATIVE NEGATIVE Final    Comment: (NOTE) The Xpert SA Assay (FDA approved for NASAL specimens in patients 35 years of age and older), is one component  of a comprehensive surveillance program. It is not intended to diagnose infection nor to guide or monitor treatment. Performed at [facility], [place] [address], [place], [place] [address]   Surgical pcr screen     Status: None   Collection Time: 08/16/22 11:19 AM   Specimen: Nasal Mucosa; Nasal Swab  Result Value Ref Range Status   MRSA, PCR NEGATIVE NEGATIVE Final   Staphylococcus aureus NEGATIVE NEGATIVE Final    Comment: (NOTE) The Xpert SA Assay (FDA approved for NASAL specimens in patients 68 years of age and older), is one component of a comprehensive surveillance program. It is not intended to diagnose infection nor to guide or monitor treatment. Performed at [facility], [place] [address], [place], [place] [address]   Aerobic/Anaerobic Culture w Gram Stain (surgical/deep wound)     Status: None (Preliminary result)   Collection Time: 08/16/22  2:01 PM   Specimen: Other Source; Tissue  Result Value Ref Range Status   Specimen Description KNEE LEFT  Final   Special  Requests SAMPLE NO 1  Final   Gram Stain NO WBC SEEN NO ORGANISMS SEEN   Final   Culture   Final    NO GROWTH 3 DAYS NO ANAEROBES ISOLATED; CULTURE IN PROGRESS FOR 5 DAYS Performed at [facility], [address], [place], [place] [address]    Report Status PENDING  Incomplete  Aerobic/Anaerobic Culture w Gram Stain (surgical/deep wound)     Status: None (Preliminary result)   Collection Time: 08/16/22  2:02 PM   Specimen: Other Source; Tissue  Result Value Ref Range Status   Specimen Description TISSUE LEFT KNEE  Final   Special Requests SAMPLE NO 2  Final   Gram Stain   Final    WBC PRESENT, PREDOMINANTLY MONONUCLEAR NO ORGANISMS SEEN    Culture   Final    NO GROWTH 3 DAYS NO ANAEROBES ISOLATED; CULTURE IN PROGRESS FOR 5 DAYS Performed at [facility] [address] [place], [place] [address]    Report Status PENDING  Incomplete  Aerobic/Anaerobic Culture w Gram Stain (surgical/deep wound)     Status: None (Preliminary result)   Collection Time: 08/16/22  2:02 PM   Specimen: Other Source; Tissue  Result Value Ref Range Status   Specimen Description TISSUE LEFT KNEE  Final   Special Requests SAMPLE NO 3  Final   Gram Stain   Final    WBC PRESENT, PREDOMINANTLY MONONUCLEAR NO ORGANISMS SEEN    Culture   Final    NO GROWTH 3 DAYS NO ANAEROBES ISOLATED; CULTURE IN PROGRESS FOR 5 DAYS Performed at [facility] [address] [place], [place] [address]    Report Status PENDING  Incomplete     Radiology Studies: No results found.  Scheduled Meds:  acetaminophen  650 mg Oral Q8H   apixaban  5 mg Oral BID   busPIRone  30 mg Oral BID   docusate sodium  100 mg Oral BID   DULoxetine  120 mg Oral QHS   metoprolol tartrate  25 mg Oral BID   mirabegron ER  50 mg Oral Daily   pantoprazole  40 mg Oral Daily   polyethylene glycol  17 g Oral Daily   senna  1 tablet Oral BID   sodium chloride flush  3 mL Intravenous Q12H   Continuous Infusions:   cefTRIAXone (ROCEPHIN)  IV Stopped (08/18/22 2118)   methocarbamol (ROBAXIN) IV  sodium chloride 500 mL/hr at 08/19/22 0334     LOS: 5 days   Shelly Coss, MD [facility] P10/07/2022, 12:50 PM

## 2022-08-20 ENCOUNTER — Ambulatory Visit: Payer: PPO | Admitting: Psychiatry

## 2022-08-20 DIAGNOSIS — M9712XA Periprosthetic fracture around internal prosthetic left knee joint, initial encounter: Secondary | ICD-10-CM | POA: Diagnosis not present

## 2022-08-20 LAB — CBC
HCT: 30.6 % — ABNORMAL LOW (ref 36.0–46.0)
Hemoglobin: 9.5 g/dL — ABNORMAL LOW (ref 12.0–15.0)
MCH: 30.2 pg (ref 26.0–34.0)
MCHC: 31 g/dL (ref 30.0–36.0)
MCV: 97.1 fL (ref 80.0–100.0)
Platelets: 337 10*3/uL (ref 150–400)
RBC: 3.15 MIL/uL — ABNORMAL LOW (ref 3.87–5.11)
RDW: 14.4 % (ref 11.5–15.5)
WBC: 10.9 10*3/uL — ABNORMAL HIGH (ref 4.0–10.5)
nRBC: 0 % (ref 0.0–0.2)

## 2022-08-20 MED ORDER — POLYETHYLENE GLYCOL 3350 17 G PO PACK: 17.0000 g | PACK | Freq: Every day | ORAL | 0 refills | Status: DC | PRN

## 2022-08-20 MED ORDER — SENNA 8.6 MG PO TABS: 1.0000 | ORAL_TABLET | Freq: Two times a day (BID) | ORAL | 0 refills | Status: DC

## 2022-08-20 MED ORDER — DILTIAZEM HCL 25 MG/5ML IV SOLN
5.0000 mg | Freq: Once | INTRAVENOUS | Status: AC
  Administered 2022-08-20: 5 mg via INTRAVENOUS
  Filled 2022-08-20 (×3): qty 5

## 2022-08-20 MED ORDER — DILTIAZEM LOAD VIA INFUSION
5.0000 mg | Freq: Once | INTRAVENOUS | Status: DC
  Filled 2022-08-20: qty 5

## 2022-08-20 MED ORDER — SODIUM CHLORIDE 0.9 % IV SOLN
1.0000 g | Freq: Once | INTRAVENOUS | Status: AC
  Administered 2022-08-20: 1 g via INTRAVENOUS
  Filled 2022-08-20: qty 10

## 2022-08-20 MED ORDER — APIXABAN 5 MG PO TABS: 5.0000 mg | ORAL_TABLET | Freq: Two times a day (BID) | ORAL | Status: DC

## 2022-08-20 MED ORDER — METOPROLOL TARTRATE 25 MG PO TABS: 25.0000 mg | ORAL_TABLET | Freq: Two times a day (BID) | ORAL | Status: DC

## 2022-08-20 MED ORDER — DILTIAZEM LOAD VIA INFUSION
10.0000 mg | Freq: Once | INTRAVENOUS | Status: DC
  Filled 2022-08-20: qty 10

## 2022-08-20 MED ORDER — HYDROCODONE-ACETAMINOPHEN 5-325 MG PO TABS: 1.0000 | ORAL_TABLET | ORAL | 0 refills | Status: DC | PRN

## 2022-08-20 MED ORDER — LORAZEPAM 0.5 MG PO TABS: 0.5000 mg | ORAL_TABLET | Freq: Two times a day (BID) | ORAL | 0 refills | Status: DC | PRN

## 2022-08-20 NOTE — Progress Notes (Signed)
Physical Therapy Treatment Patient Details Name: [name] MRN: [id number] DOB: 1946-02-24 Today's Date: 08/20/2022   History of Present Illness 76 y/o female admitted on 08/16/22 following L TKA revision and repair of patellar tendon injury. Recent L TKA on 9/11 and discharged to SNF then home where she hit knee on chair causing displaced patellar fracture. PMH: CVA.    PT Comments    Pt received with chair alarm sounding, on edge of recliner, attempting to get up by herself. Assisted with PT tech and RN to transfer pt back to chair. Pt requiring two person maximal assist for performing lateral scoot towards right. Pt remains limited by decreased cognition, poor safety awareness, impaired sitting balance, pain and weakness. Continue to recommend SNF for ongoing Physical Therapy.     Recommendations for follow up therapy are one component of a multi-disciplinary discharge planning process, led by the attending physician.  Recommendations may be updated based on patient status, additional functional criteria and insurance authorization.  Follow Up Recommendations  Skilled nursing-short term rehab (<3 hours/day) Can patient physically be transported by private vehicle: No   Assistance Recommended at Discharge Frequent or constant Supervision/Assistance  Patient can return home with the following Two people to help with walking and/or transfers;Two people to help with bathing/dressing/bathroom;Assistance with cooking/housework;Direct supervision/assist for medications management;Direct supervision/assist for financial management;Assist for transportation;Help with stairs or ramp for entrance   Equipment Recommendations  None recommended by PT    Recommendations for Other Services       Precautions / Restrictions Precautions Precautions: Fall;Knee Precaution Booklet Issued: No Precaution Comments: Wound vac Knee Immobilizer - Left: Other (comment);On at all times  (splint) Restrictions Weight Bearing Restrictions: Yes LLE Weight Bearing: Touchdown weight bearing     Mobility  Bed Mobility Overal bed mobility: Needs Assistance Bed Mobility: Sit to Supine       Sit to supine: +2 for physical assistance, Mod assist   General bed mobility comments: Assist for elevating BLE's back into bed    Transfers Overall transfer level: Needs assistance Equipment used: None Transfers: Bed to chair/wheelchair/BSC            Lateral/Scoot Transfers: Max assist, +2 physical assistance General transfer comment: MaxA + 2 for lateral scoot transfer from chair to bed towards right with use of bed pad    Ambulation/Gait                   Stairs             Wheelchair Mobility    Modified Rankin (Stroke Patients Only)       Balance Overall balance assessment: Needs assistance Sitting-balance support: Feet supported, No upper extremity supported Sitting balance-Leahy Scale: Fair                                      Cognition Arousal/Alertness: Awake/alert Behavior During Therapy: Flat affect Overall Cognitive Status: No family/caregiver present to determine baseline cognitive functioning Area of Impairment: Attention, Orientation, Memory, Following commands, Safety/judgement, Problem solving                 Orientation Level: Disoriented to, Place, Time, Situation Current Attention Level: Focused Memory: Decreased short-term memory Following Commands: Follows one step commands with increased time Safety/Judgement: Decreased awareness of safety, Decreased awareness of deficits   Problem Solving: Slow processing, Decreased initiation, Requires verbal cues, Difficulty sequencing, Requires tactile cues General  Comments: Pt attempting to get out of the chair upon arrival. Pt refusing to take pills, stating, "they won't accept my payment," and pointing to her phone.        Exercises      General  Comments        Pertinent Vitals/Pain Pain Assessment Pain Assessment: Faces Faces Pain Scale: Hurts even more Pain Location: L knee with movement Pain Descriptors / Indicators: Sore, Guarding, Discomfort Pain Intervention(s): Limited activity within patient's tolerance, Monitored during session, RN gave pain meds during session    Home Living                          Prior Function            PT Goals (current goals can now be found in the care plan section) Acute Rehab PT Goals Patient Stated Goal: none stated Potential to Achieve Goals: Fair Progress towards PT goals: Progressing toward goals (slowly)    Frequency    Min 2X/week      PT Plan Current plan remains appropriate    Co-evaluation              AM-PAC PT "6 Clicks" Mobility   Outcome Measure  Help needed turning from your back to your side while in a flat bed without using bedrails?: Total Help needed moving from lying on your back to sitting on the side of a flat bed without using bedrails?: Total Help needed moving to and from a bed to a chair (including a wheelchair)?: Total Help needed standing up from a chair using your arms (e.g., wheelchair or bedside chair)?: Total Help needed to walk in hospital room?: Total Help needed climbing 3-5 steps with a railing? : Total 6 Click Score: 6    End of Session Equipment Utilized During Treatment: Gait belt Activity Tolerance: Patient tolerated treatment well Patient left: in bed;with call bell/phone within reach;with bed alarm set Nurse Communication: Mobility status PT Visit Diagnosis: Other abnormalities of gait and mobility (R26.89);Muscle weakness (generalized) (M62.81)     Time: 1100-1111 PT Time Calculation (min) (ACUTE ONLY): 11 min  Charges:  $Therapeutic Activity: 8-22 mins                     Wyona Almas, PT, DPT Acute Rehabilitation Services Office [phone]    Deno Etienne 08/20/2022, 1:34 PM

## 2022-08-20 NOTE — TOC Progression Note (Addendum)
Transition of Care [facility]) - Progression Note    Patient Details  Name: [name] MRN: [id number] Date of Birth: September 05, 1946  [facility]) CM/SW Contact  Joanne Chars, LCSW Phone Number: 08/20/2022, 8:38 AM  Clinical Narrative:   Message from [facility].  Pt is good to return to [facility] once auth approved.   1000: TC HTA.  They are requesting a new PT note.  PT notified. 1330: PT note in, HTA notified.     Expected Discharge Plan: [place] Barriers to Discharge: Continued Medical Work up  Expected Discharge Plan and Services Expected Discharge Plan: [place] In-house Referral: Clinical Social Work   Post Acute Care Choice: [place] Living arrangements for the past 2 months: Single Family Home                                       Social Determinants of Health (SDOH) Interventions Food Insecurity Interventions: Intervention Not Indicated Housing Interventions: Intervention Not Indicated Transportation Interventions: Intervention Not Indicated Utilities Interventions: Intervention Not Indicated  Readmission Risk Interventions     No data to display

## 2022-08-20 NOTE — Progress Notes (Signed)
Physical Therapy Treatment Patient Details Name: [name] MRN: [id number] DOB: May 30, 1946 Today's Date: 08/20/2022   History of Present Illness 76 y/o female admitted on 08/16/22 following L TKA revision and repair of patellar tendon injury. Recent L TKA on 9/11 and discharged to SNF then home where she hit knee on chair causing displaced patellar fracture. PMH: CVA.    PT Comments    Patient progressing slowly towards PT goals. Session focused on transfer OOB to chair. Pt continues to requires Max A of 2 for bed mobility mostly due to cognitive deficits and likely pain. Requires max A of 2 to laterally scoot to right into recliner with pt providing little initiation/assist. Pt confused this session, asking when she is getting on the boat. Follows 1 step commands but needs constant cues to stay on task and not get distracted by lines/gown etc. Continues to be appropriate for SNF. Will follow.    Recommendations for follow up therapy are one component of a multi-disciplinary discharge planning process, led by the attending physician.  Recommendations may be updated based on patient status, additional functional criteria and insurance authorization.  Follow Up Recommendations  Skilled nursing-short term rehab (<3 hours/day) Can patient physically be transported by private vehicle: No   Assistance Recommended at Discharge Frequent or constant Supervision/Assistance  Patient can return home with the following Two people to help with walking and/or transfers;Two people to help with bathing/dressing/bathroom;Assistance with cooking/housework;Direct supervision/assist for medications management;Direct supervision/assist for financial management;Assist for transportation;Help with stairs or ramp for entrance   Equipment Recommendations  None recommended by PT    Recommendations for Other Services       Precautions / Restrictions Precautions Precautions: Fall;Knee Precaution Booklet  Issued: No Precaution Comments: Wound vac Knee Immobilizer - Left: Other (comment);On at all times (splint) Restrictions Weight Bearing Restrictions: Yes LLE Weight Bearing: Touchdown weight bearing     Mobility  Bed Mobility Overal bed mobility: Needs Assistance Bed Mobility: Supine to Sit     Supine to sit: +2 for physical assistance, Max assist     General bed mobility comments: Assist with LEs, trunk and scooting bottom to EOB. increased time. Able to initiate some movement of LEs but needs constant cues to continue with task.    Transfers Overall transfer level: Needs assistance Equipment used: 2 person hand held assist Transfers: Sit to/from Stand, Bed to chair/wheelchair/BSC            Lateral/Scoot Transfers: Max assist, +2 physical assistance, From elevated surface General transfer comment: Assist of 2 to laterally scoot towards right into recliner with 2 scoots needed using pad. Pt not able to assist using RLE due to decreased ankle AROM, unable to get to neutral in plantarflexed position.    Ambulation/Gait               General Gait Details: Unable   Stairs             Wheelchair Mobility    Modified Rankin (Stroke Patients Only)       Balance Overall balance assessment: Needs assistance Sitting-balance support: Feet supported, No upper extremity supported Sitting balance-Leahy Scale: Fair Sitting balance - Comments: Supervision for safety.                                    Cognition Arousal/Alertness: Awake/alert Behavior During Therapy: Flat affect Overall Cognitive Status: No family/caregiver present to determine baseline cognitive  functioning Area of Impairment: Attention, Orientation, Memory, Following commands, Safety/judgement, Problem solving                 Orientation Level: Disoriented to, Place, Time, Situation Current Attention Level: Focused Memory: Decreased short-term memory Following  Commands: Follows one step commands with increased time Safety/Judgement: Decreased awareness of safety, Decreased awareness of deficits   Problem Solving: Slow processing, Decreased initiation, Requires verbal cues, Difficulty sequencing, Requires tactile cues General Comments: Pt wanting to get on the boat, asking for her bags. oriented to self only, thinks she is at the Sleep clinic. Confused.        Exercises General Exercises - Lower Extremity Ankle Circles/Pumps: AAROM, Both, 10 reps, Supine Long Arc Quad: AROM, Right, AAROM, 10 reps, Seated    General Comments        Pertinent Vitals/Pain Pain Assessment Pain Assessment: Faces Faces Pain Scale: Hurts little more Pain Location: L knee with movement Pain Descriptors / Indicators: Sore, Guarding, Discomfort Pain Intervention(s): Monitored during session, Repositioned, Limited activity within patient's tolerance    Home Living                          Prior Function            PT Goals (current goals can now be found in the care plan section) Progress towards PT goals: Progressing toward goals (slowly)    Frequency    Min 2X/week      PT Plan Current plan remains appropriate    Co-evaluation              AM-PAC PT "6 Clicks" Mobility   Outcome Measure  Help needed turning from your back to your side while in a flat bed without using bedrails?: Total Help needed moving from lying on your back to sitting on the side of a flat bed without using bedrails?: Total Help needed moving to and from a bed to a chair (including a wheelchair)?: Total Help needed standing up from a chair using your arms (e.g., wheelchair or bedside chair)?: Total Help needed to walk in hospital room?: Total Help needed climbing 3-5 steps with a railing? : Total 6 Click Score: 6    End of Session Equipment Utilized During Treatment: Gait belt Activity Tolerance: Patient tolerated treatment well Patient left: in  chair;with call bell/phone within reach;with chair alarm set Nurse Communication: Mobility status;Need for lift equipment PT Visit Diagnosis: Other abnormalities of gait and mobility (R26.89);Muscle weakness (generalized) (M62.81)     Time: 1448-1856 PT Time Calculation (min) (ACUTE ONLY): 20 min  Charges:  $Therapeutic Activity: 8-22 mins                     Marisa Severin, PT, DPT Acute Rehabilitation Services Secure chat preferred Office [place] 08/20/2022, 12:39 PM

## 2022-08-20 NOTE — Discharge Summary (Signed)
Physician Discharge Summary  [name]:[id number] DOB: 12-May-1946 DOA: 08/14/2022  PCP: Carmen Noble, MD  Admit date: 08/14/2022 Discharge date: 08/20/2022  Admitted From: SNF Disposition:  SNF  Discharge Condition:Stable CODE STATUS:FULL Diet recommendation:  Regular   Brief/Interim Summary: Patient is a 76 female with history of chronic pain syndrome, stroke, hyperlipidemia, stress incontinence, asthma, GERD, A-fib, DVT, depression, anxiety who presented from orthopedics office for management of left knee fracture.  She had a left total knee replacement on September 11 and was recovering at rehab facility but had a fall while at rehab facility and fractured her patella and her knee .  She was recently diagnosed with COVID on September 25 and was weak and lightheaded and fell.  No evidence of pneumonia as per chest x-ray that was done as an outpatient, she completed outpatient antiviral medication.  Orthopedics were following, status post revision of total left knee arthroplasty, tendon repair with graft on 10/6.  Hospital course remarkable for new onset A-fib, cardiology was also consulted.  Wound culture did not show any growth, she completed 7 days course of ceftriaxone.  Heart rate is well controlled.  She will follow-up with orthopedics and cardiology as an outpatient.  Medically stable for discharge whenever possible.  Following problems were addressed during her hospitalization:  Prosthetic fracture of left knee joint: Fell at [facility].  Status post left total knee replacement on September 11.  Orthopedics following. status post revision of total left knee arthroplasty, tendon repair with graft on 10/6. PT/OT recommending SNF on discharge. Postoperatively, she was requiring oxygen for maintenance of saturation.  Currently on room air.  Chest x-ray did not show any significant findings.  Plan for discharge to SNF as per PT/OT   Suspected cellulitis/leukocytosis: Left lower extremity also  appeared erythematous, warm on admission.  No fever.  Started on ceftriaxone with significant  improvement.  Developed  leukocytosis now improving .  Aerobic/anaerobic culture sent on 10/6 is pending without any growth.  Plan for antibiotics for 7 days course,day 7/7   Paroxysmal A-fib: As per the husband, she was diagnosed with paroxysmal A-fib at rehab but not discharged on any medications.  Not on rate control meds.  Went into A-fib with RVR after surgery, treated with Cardizem drip.  Started  on low-dose metoprolol.  Also started on Eliquis,low dose.CHA2DS2VASc of 5.  Cardiology consulted.  Echo showed EF of 55 to 60%, indeterminate left ventricular diastolic parameters.  She will follow-up with cardiology at [facility].   Hypotension/AKI: Postoperatively she went into A-fib, became hypotensive and creatinine also trended up.  Currently normotensive.  AKI resolved   COVID infection: Tested  positive on September 25.  Completed course of antiviral.  On room air.  Lifted precautions   History of CVA/hyperlipidemia: Not on anything at home   Stress incontinence: Continue home Myrbetriq   Neurocognitive deficits: Continue supportive care , delirium precautions.She is mostly oriented   Depression/anxiety/insomnia: Continue home BuSpar, duloxetine   Bilateral lower extremity edema:  BNP elevated.  Echo showed EF of 55 to 60%, indeterminate diastolic parameters.Significantly improved with lasix.  Now euvolemic  Discharge Diagnoses:  Principal Problem:   Periprosthetic fracture around internal prosthetic left knee joint Active Problems:   Neck pain, chronic   History of CVA (cerebrovascular accident)   Hyperlipidemia   Asthma   Esophageal reflux   Paroxysmal atrial fibrillation (HCC)   History of DVT of lower extremity   SARS-CoV-2 positive   Neurocognitive deficits    Discharge  Instructions  Discharge Instructions     Diet general   Complete by: As directed    Discharge  instructions   Complete by: As directed    1)Please take prescribed medications as instructed 2)Follow up with orthopedics and cardiology as an outpatient.  Name and number the providers have been attached   Increase activity slowly   Complete by: As directed    No wound care   Complete by: As directed       Allergies as of 08/20/2022       Reactions   Ticlid [ticlopidine] Shortness Of Breath   Nsaids Other (See Comments)   Bleeding ulcer   Prednisone Other (See Comments)   Makes her stomach hurt--knows this isn't an allergy but doesn't want to take it   Monistat [miconazole] Swelling, Rash   Sulfa Antibiotics Rash        Medication List     TAKE these medications    acetaminophen 500 MG tablet Commonly known as: TYLENOL Take 1,000 mg by mouth every 6 (six) hours as needed for headache, moderate pain or mild pain.   apixaban 5 MG Tabs tablet Commonly known as: ELIQUIS Take 1 tablet (5 mg total) by mouth 2 (two) times daily.   busPIRone 30 MG tablet Commonly known as: BUSPAR Take 1 tablet (30 mg total) by mouth 2 (two) times daily.   DULoxetine 60 MG capsule Commonly known as: CYMBALTA Take 1 capsule (60 mg total) by mouth 2 (two) times daily. What changed:  how much to take when to take this   eszopiclone 2 MG Tabs tablet Commonly known as: LUNESTA Take immediately before bedtime   HYDROcodone-acetaminophen 5-325 MG tablet Commonly known as: NORCO/VICODIN Take 1 tablet by mouth every 4 (four) hours as needed for moderate pain (pain score 4-6).   LORazepam 0.5 MG tablet Commonly known as: Ativan Take 1 tablet (0.5 mg total) by mouth 2 (two) times daily as needed for anxiety.   metoprolol tartrate 25 MG tablet Commonly known as: LOPRESSOR Take 1 tablet (25 mg total) by mouth 2 (two) times daily.   Myrbetriq 50 MG Tb24 tablet Generic drug: mirabegron ER Take 50 mg by mouth daily.   polyethylene glycol 17 g packet Commonly known as: MIRALAX /  GLYCOLAX Take 17 g by mouth daily as needed.   senna 8.6 MG Tabs tablet Commonly known as: SENOKOT Take 1 tablet (8.6 mg total) by mouth 2 (two) times daily.   SUMAtriptan 100 MG tablet Commonly known as: IMITREX Take 100 mg by mouth every 2 (two) hours as needed for migraine or headache. May repeat in 2 hours if headache persists or recurs.   Vitamin D 125 MCG (5000 UT) Caps Take 5,000 Units by mouth daily.        Follow-up Information     [name], MD Follow up on 08/27/2022.   Specialty: Orthopedic Surgery Contact information: [place] [address] [phone]         Arnoldo Lenis, MD Follow up on 09/03/2022.   Specialty: Cardiology Why: at 1:00pm with Dr. Dellia Cloud cardiology Contact information: [place] [address] [phone]                Allergies  Allergen Reactions   Ticlid [Ticlopidine] Shortness Of Breath   Nsaids Other (See Comments)    Bleeding ulcer   Prednisone Other (See Comments)    Makes her stomach hurt--knows this isn't an allergy  but doesn't want to take it    Monistat [Miconazole] Swelling and Rash   Sulfa Antibiotics Rash    Consultations: Cardiology, orthopedics   Procedures/Studies: DG CHEST PORT 1 VIEW  Result Date: 08/16/2022 CLINICAL DATA:  Short of breath EXAM: PORTABLE CHEST 1 VIEW COMPARISON:  Three hundred eighteen FINDINGS: Single frontal view of the chest demonstrates an unremarkable cardiac silhouette. Chronic elevation of the left hemidiaphragm. No acute airspace disease, effusion, or pneumothorax. No acute bony abnormality. IMPRESSION: 1. No acute intrathoracic process. Electronically Signed   By: Randa Ngo M.D.   On: 08/16/2022 20:39   ECHOCARDIOGRAM COMPLETE  Result Date: 08/16/2022    ECHOCARDIOGRAM REPORT   Patient Name:   [name] Date of Exam: 08/15/2022 Medical Rec #:  [id number]     Height:       65.0 in Accession #:    [id number]     Weight:       206.9 lb Date of Birth:  02-14-1946    BSA:          2.007 m Patient Age:    76 years      BP:           121/82 mmHg Patient Gender: F             HR:           115 bpm. Exam Location:  Inpatient Procedure: 2D Echo, Color Doppler and Cardiac Doppler Indications:    CHF  History:        Patient has no prior history of Echocardiogram examinations.                 Stroke; Signs/Symptoms:Murmur.  Sonographer:    Memory Argue Referring Phys: [id number] Tyleah Loh  Sonographer Comments: Technically difficult study due to poor echo windows. IMPRESSIONS  1. Poor quality exam. Consider repeat exam when HR is better controlled.  2. Left ventricular ejection fraction, by estimation, is 55 to 60%. The left ventricle has normal function. Left ventricular endocardial border not optimally defined to evaluate regional wall motion. Left ventricular diastolic parameters are indeterminate. Definity contrast not used.  3. Right ventricular systolic function is normal. The right ventricular size is normal. There is normal pulmonary artery systolic pressure.  4. Left atrial size was moderately dilated.  5. The mitral valve is grossly normal. Trivial mitral valve regurgitation. No evidence of mitral stenosis.  6. The aortic valve was not well visualized. Aortic valve regurgitation is not visualized. Aortic valve sclerosis is present, with no evidence of aortic valve stenosis.  7. The inferior vena cava is normal in size with <50% respiratory variability, suggesting right atrial pressure of 8 mmHg. FINDINGS  Left Ventricle: Left ventricular ejection fraction, by estimation, is 55 to 60%. The left ventricle has normal function. Left ventricular endocardial border not optimally defined to evaluate regional wall motion. The left ventricular internal cavity size was normal in size. There is no left ventricular hypertrophy. Left ventricular diastolic parameters are indeterminate. Right Ventricle: The right ventricular size  is normal. No increase in right ventricular wall thickness. Right ventricular systolic function is normal. There is normal pulmonary artery systolic pressure. The tricuspid regurgitant velocity is 2.19 m/s, and  with an assumed right atrial pressure of 8 mmHg, the estimated right ventricular systolic pressure is 26.9 mmHg. Left Atrium: Left atrial size was moderately dilated. Right Atrium: Right atrial size was normal in size. Pericardium: There is no evidence of pericardial effusion. Mitral Valve: The mitral  valve is grossly normal. Trivial mitral valve regurgitation. No evidence of mitral valve stenosis. Tricuspid Valve: The tricuspid valve is not well visualized. Tricuspid valve regurgitation is trivial. No evidence of tricuspid stenosis. Aortic Valve: The aortic valve was not well visualized. Aortic valve regurgitation is not visualized. Aortic valve sclerosis is present, with no evidence of aortic valve stenosis. Aortic valve mean gradient measures 7.0 mmHg. Aortic valve peak gradient measures 13.1 mmHg. Aortic valve area, by VTI measures 2.00 cm. Pulmonic Valve: The pulmonic valve was not assessed. Pulmonic valve regurgitation is not visualized. No evidence of pulmonic stenosis. Aorta: The ascending aorta was not well visualized. Venous: The inferior vena cava is normal in size with less than 50% respiratory variability, suggesting right atrial pressure of 8 mmHg. IAS/Shunts: No atrial level shunt detected by color flow Doppler.  LEFT VENTRICLE PLAX 2D LVIDd:         4.00 cm LVIDs:         2.60 cm LV PW:         1.00 cm LV IVS:        1.00 cm LVOT diam:     2.00 cm LV SV:         55 LV SV Index:   28 LVOT Area:     3.14 cm  RIGHT VENTRICLE TAPSE (M-mode): 2.0 cm LEFT ATRIUM             Index        RIGHT ATRIUM           Index LA diam:        3.20 cm 1.59 cm/m   RA Area:     15.10 cm LA Vol (A2C):   80.8 ml 40.24 ml/m  RA Volume:   38.50 ml  19.18 ml/m LA Vol (A4C):   95.9 ml 47.79 ml/m LA Biplane Vol:  95.6 ml 47.64 ml/m  AORTIC VALVE AV Area (Vmax):    1.89 cm AV Area (Vmean):   1.92 cm AV Area (VTI):     2.00 cm AV Vmax:           181.00 cm/s AV Vmean:          124.000 cm/s AV VTI:            0.277 m AV Peak Grad:      13.1 mmHg AV Mean Grad:      7.0 mmHg LVOT Vmax:         109.00 cm/s LVOT Vmean:        75.800 cm/s LVOT VTI:          0.176 m LVOT/AV VTI ratio: 0.64  AORTA Ao Root diam: 3.60 cm MR Peak grad: 37.5 mmHg   TRICUSPID VALVE MR Vmax:      306.00 cm/s TR Peak grad:   19.2 mmHg                           TR Vmax:        219.00 cm/s                            SHUNTS                           Systemic VTI:  0.18 m  Systemic Diam: 2.00 cm Cherlynn Kaiser MD Electronically signed by Cherlynn Kaiser MD Signature Date/Time: 08/16/2022/6:03:32 AM    Final    DG Knee Left Port  Result Date: 07/22/2022 CLINICAL DATA:  Postoperative total knee arthroplasty EXAM: PORTABLE LEFT KNEE - 1-2 VIEW COMPARISON:  None Available. FINDINGS: Status post left knee total arthroplasty with expected overlying postoperative change. No evidence of perihardware fracture or component malpositioning. IMPRESSION: Status post left knee total arthroplasty with expected overlying postoperative change. No evidence of perihardware fracture or component malpositioning. Electronically Signed   By: Delanna Ahmadi M.D.   On: 07/22/2022 13:18      Subjective: Patient seen and examined at bedside today.  Hemodynamically stable.  Sitting on the bed.  Not in any Distress.  Medically stable for discharge.I called and discussed with the husband on phone about discharge planning  Discharge Exam: Vitals:   08/20/22 0409 08/20/22 0719  BP: 107/70 119/74  Pulse: 97 (!) 101  Resp: 18   Temp: 98.7 F (37.1 C) 97.9 F (36.6 C)  SpO2: 93% 97%   Vitals:   08/19/22 2052 08/20/22 0049 08/20/22 0409 08/20/22 0719  BP:  (!) 100/58 107/70 119/74  Pulse: 100 92 97 (!) 101  Resp:  17 18   Temp:  97.9 F (36.6  C) 98.7 F (37.1 C) 97.9 F (36.6 C)  TempSrc:  Oral Oral Oral  SpO2:  99% 93% 97%  Weight:      Height:        General: Pt is alert, awake, not in acute distress Cardiovascular: RRR, S1/S2 +, no rubs, no gallops Respiratory: CTA bilaterally, no wheezing, no rhonchi Abdominal: Soft, NT, ND, bowel sounds + Extremities: no edema, no cyanosis, left knee wrapped with dressing, wound VAC    The results of significant diagnostics from this hospitalization (including imaging, microbiology, ancillary and laboratory) are listed below for reference.     Microbiology: Recent Results (from the past 240 hour(s))  Surgical pcr screen     Status: None   Collection Time: 08/16/22  7:00 AM   Specimen: Nasal Mucosa; Nasal Swab  Result Value Ref Range Status   MRSA, PCR NEGATIVE NEGATIVE Final   Staphylococcus aureus NEGATIVE NEGATIVE Final    Comment: (NOTE) The Xpert SA Assay (FDA approved for NASAL specimens in patients 9 years of age and older), is one component of a comprehensive surveillance program. It is not intended to diagnose infection nor to guide or monitor treatment. Performed at [facility], [place] [address] [place], [place] [address]   Surgical pcr screen     Status: None   Collection Time: 08/16/22 11:19 AM   Specimen: Nasal Mucosa; Nasal Swab  Result Value Ref Range Status   MRSA, PCR NEGATIVE NEGATIVE Final   Staphylococcus aureus NEGATIVE NEGATIVE Final    Comment: (NOTE) The Xpert SA Assay (FDA approved for NASAL specimens in patients 49 years of age and older), is one component of a comprehensive surveillance program. It is not intended to diagnose infection nor to guide or monitor treatment. Performed at [facility], [place] [address] [place], [place] [address]   Aerobic/Anaerobic Culture w Gram Stain (surgical/deep wound)     Status: None (Preliminary result)   Collection Time: 08/16/22  2:01 PM   Specimen: Other Source; Tissue  Result Value Ref  Range Status   Specimen Description KNEE LEFT  Final   Special Requests SAMPLE NO 1  Final   Gram Stain NO WBC SEEN  NO ORGANISMS SEEN   Final   Culture   Final    NO GROWTH 4 DAYS NO ANAEROBES ISOLATED; CULTURE IN PROGRESS FOR 5 DAYS Performed at [facility], [place] [address], [place], [place] [address]    Report Status PENDING  Incomplete  Aerobic/Anaerobic Culture w Gram Stain (surgical/deep wound)     Status: None (Preliminary result)   Collection Time: 08/16/22  2:02 PM   Specimen: Other Source; Tissue  Result Value Ref Range Status   Specimen Description TISSUE LEFT KNEE  Final   Special Requests SAMPLE NO 2  Final   Gram Stain   Final    WBC PRESENT, PREDOMINANTLY MONONUCLEAR NO ORGANISMS SEEN    Culture   Final    NO GROWTH 4 DAYS NO ANAEROBES ISOLATED; CULTURE IN PROGRESS FOR 5 DAYS Performed at [facility] [address] [place], [place] [address]    Report Status PENDING  Incomplete  Aerobic/Anaerobic Culture w Gram Stain (surgical/deep wound)     Status: None (Preliminary result)   Collection Time: 08/16/22  2:02 PM   Specimen: Other Source; Tissue  Result Value Ref Range Status   Specimen Description TISSUE LEFT KNEE  Final   Special Requests SAMPLE NO 3  Final   Gram Stain   Final    WBC PRESENT, PREDOMINANTLY MONONUCLEAR NO ORGANISMS SEEN    Culture   Final    NO GROWTH 4 DAYS NO ANAEROBES ISOLATED; CULTURE IN PROGRESS FOR 5 DAYS Performed at [facility] [address], [place], [place] [address]    Report Status PENDING  Incomplete     Labs: BNP (last 3 results) Recent Labs    08/15/22 1207  BNP 595.6*   Basic Metabolic Panel: Recent Labs  Lab 08/15/22 0254 08/16/22 0241 08/17/22 0221 08/18/22 0316 08/19/22 0305  NA 138 139 135 135 136  K 3.7 3.6 3.8 3.9 3.9  CL 101 98 93* 95* 99  CO2 '27 30 28 28 28  '$ GLUCOSE 99 103* 132* 121* 107*  BUN '8 9 13 '$ 24* 22  CREATININE 0.60 0.69 1.32* 1.33* 0.70  CALCIUM 9.0 9.1 8.8* 8.8*  8.9   Liver Function Tests: No results for input(s): "AST", "ALT", "ALKPHOS", "BILITOT", "PROT", "ALBUMIN" in the last 168 hours. No results for input(s): "LIPASE", "AMYLASE" in the last 168 hours. No results for input(s): "AMMONIA" in the last 168 hours. CBC: Recent Labs  Lab 08/15/22 0254 08/17/22 0221 08/18/22 0316 08/19/22 0305 08/20/22 0120  WBC 7.1 16.9* 15.6* 14.1* 10.9*  HGB 10.4* 10.8* 9.8* 10.0* 9.5*  HCT 31.1* 34.2* 31.0* 31.9* 30.6*  MCV 94.5 96.1 96.6 97.9 97.1  PLT 319 387 331 312 337   Cardiac Enzymes: No results for input(s): "CKTOTAL", "CKMB", "CKMBINDEX", "TROPONINI" in the last 168 hours. BNP: Invalid input(s): "POCBNP" CBG: No results for input(s): "GLUCAP" in the last 168 hours. D-Dimer No results for input(s): "DDIMER" in the last 72 hours. Hgb A1c No results for input(s): "HGBA1C" in the last 72 hours. Lipid Profile No results for input(s): "CHOL", "HDL", "LDLCALC", "TRIG", "CHOLHDL", "LDLDIRECT" in the last 72 hours. Thyroid function studies No results for input(s): "TSH", "T4TOTAL", "T3FREE", "THYROIDAB" in the last 72 hours.  Invalid input(s): "FREET3" Anemia work up No results for input(s): "VITAMINB12", "FOLATE", "FERRITIN", "TIBC", "IRON", "RETICCTPCT" in the last 72 hours. Urinalysis No results found for: "COLORURINE", "APPEARANCEUR", "LABSPEC", "PHURINE", "GLUCOSEU", "HGBUR", "BILIRUBINUR", "KETONESUR", "PROTEINUR", "UROBILINOGEN", "NITRITE", "LEUKOCYTESUR" Sepsis Labs Recent Labs  Lab 08/17/22 0221  08/18/22 0316 08/19/22 0305 08/20/22 0120  WBC 16.9* 15.6* 14.1* 10.9*   Microbiology Recent Results (from the past 240 hour(s))  Surgical pcr screen     Status: None   Collection Time: 08/16/22  7:00 AM   Specimen: Nasal Mucosa; Nasal Swab  Result Value Ref Range Status   MRSA, PCR NEGATIVE NEGATIVE Final   Staphylococcus aureus NEGATIVE NEGATIVE Final    Comment: (NOTE) The Xpert SA Assay (FDA approved for NASAL specimens in  patients 7 years of age and older), is one component of a comprehensive surveillance program. It is not intended to diagnose infection nor to guide or monitor treatment. Performed at [facility], [place] [address], [place], [place] [address]   Surgical pcr screen     Status: None   Collection Time: 08/16/22 11:19 AM   Specimen: Nasal Mucosa; Nasal Swab  Result Value Ref Range Status   MRSA, PCR NEGATIVE NEGATIVE Final   Staphylococcus aureus NEGATIVE NEGATIVE Final    Comment: (NOTE) The Xpert SA Assay (FDA approved for NASAL specimens in patients 83 years of age and older), is one component of a comprehensive surveillance program. It is not intended to diagnose infection nor to guide or monitor treatment. Performed at [facility], [place] [address] [place], [place] [address]   Aerobic/Anaerobic Culture w Gram Stain (surgical/deep wound)     Status: None (Preliminary result)   Collection Time: 08/16/22  2:01 PM   Specimen: Other Source; Tissue  Result Value Ref Range Status   Specimen Description KNEE LEFT  Final   Special Requests SAMPLE NO 1  Final   Gram Stain NO WBC SEEN NO ORGANISMS SEEN   Final   Culture   Final    NO GROWTH 4 DAYS NO ANAEROBES ISOLATED; CULTURE IN PROGRESS FOR 5 DAYS Performed at [facility], [address], [place], [place] [address]    Report Status PENDING  Incomplete  Aerobic/Anaerobic Culture w Gram Stain (surgical/deep wound)     Status: None (Preliminary result)   Collection Time: 08/16/22  2:02 PM   Specimen: Other Source; Tissue  Result Value Ref Range Status   Specimen Description TISSUE LEFT KNEE  Final   Special Requests SAMPLE NO 2  Final   Gram Stain   Final    WBC PRESENT, PREDOMINANTLY MONONUCLEAR NO ORGANISMS SEEN    Culture   Final    NO GROWTH 4 DAYS NO ANAEROBES ISOLATED; CULTURE IN PROGRESS FOR 5 DAYS Performed at [facility] [address] [place], [place] [address]    Report Status PENDING   Incomplete  Aerobic/Anaerobic Culture w Gram Stain (surgical/deep wound)     Status: None (Preliminary result)   Collection Time: 08/16/22  2:02 PM   Specimen: Other Source; Tissue  Result Value Ref Range Status   Specimen Description TISSUE LEFT KNEE  Final   Special Requests SAMPLE NO 3  Final   Gram Stain   Final    WBC PRESENT, PREDOMINANTLY MONONUCLEAR NO ORGANISMS SEEN    Culture   Final    NO GROWTH 4 DAYS NO ANAEROBES ISOLATED; CULTURE IN PROGRESS FOR 5 DAYS Performed at [facility] [address], [place], [place] [address]    Report Status PENDING  Incomplete    Please note: You were cared for by a hospitalist during your hospital stay. Once you are discharged, your primary care physician will handle any further medical issues. Please note that NO  REFILLS for any discharge medications will be authorized once you are discharged, as it is imperative that you return to your primary care physician (or establish a relationship with a primary care physician if you do not have one) for your post hospital discharge needs so that they can reassess your need for medications and monitor your lab values.    Time coordinating discharge: 40 minutes  SIGNED:   Shelly Coss, MD  [facility] 08/20/2022, 10:56 AM Pager [phone]  If 7PM-7AM, please contact night-coverage [email or web address] Password [email or web address]

## 2022-08-21 ENCOUNTER — Encounter (HOSPITAL_COMMUNITY): Payer: PPO

## 2022-08-21 ENCOUNTER — Inpatient Hospital Stay (HOSPITAL_COMMUNITY): Payer: PPO

## 2022-08-21 DIAGNOSIS — M9712XA Periprosthetic fracture around internal prosthetic left knee joint, initial encounter: Secondary | ICD-10-CM | POA: Diagnosis not present

## 2022-08-21 LAB — AEROBIC/ANAEROBIC CULTURE W GRAM STAIN (SURGICAL/DEEP WOUND)
Culture: NO GROWTH
Culture: NO GROWTH
Culture: NO GROWTH
Gram Stain: NONE SEEN

## 2022-08-21 MED ORDER — DILTIAZEM HCL 25 MG/5ML IV SOLN
10.0000 mg | Freq: Once | INTRAVENOUS | Status: AC
  Administered 2022-08-21: 10 mg via INTRAVENOUS
  Filled 2022-08-21: qty 5

## 2022-08-21 NOTE — Progress Notes (Signed)
     Subjective: Patient seen this afternoon. Lying in bed in no acute distress. Comfortable. Delayed response when answering questions, disoriented to situation. Denies distal n/t. Had worked with PT yesterday and was able to get up to the chair but required 2 person max assist.  Objective:   VITALS:   Vitals:   08/21/22 0549 08/21/22 0802 08/21/22 0827 08/21/22 0828  BP:   (!) 119/90   Pulse: (!) 103   (!) 104  Resp:      Temp:  98 F (36.7 C)    TempSrc:      SpO2:  96%    Weight:      Height:       LLE: Sensation intact distally Dorsiflexion/Plantar flexion intact Incision: prevena wound vac holding suction no output in cannister Compartment soft   Lab Results  Component Value Date   WBC 10.9 (H) 08/20/2022   HGB 9.5 (L) 08/20/2022   HCT 30.6 (L) 08/20/2022   MCV 97.1 08/20/2022   PLT 337 08/20/2022   BMET    Component Value Date/Time   NA 136 08/19/2022 0305   NA 140 05/27/2017 1553   K 3.9 08/19/2022 0305   CL 99 08/19/2022 0305   CO2 28 08/19/2022 0305   GLUCOSE 107 (H) 08/19/2022 0305   BUN 22 08/19/2022 0305   BUN 15 05/27/2017 1553   CREATININE 0.70 08/19/2022 0305   CALCIUM 8.9 08/19/2022 0305   GFRNONAA >60 08/19/2022 0305    Assessment/Plan: 5 Days Post-Op   Principal Problem:   Periprosthetic fracture around internal prosthetic left knee joint Active Problems:   Neck pain, chronic   History of CVA (cerebrovascular accident)   Hyperlipidemia   Asthma   Esophageal reflux   Paroxysmal atrial fibrillation (HCC)   History of DVT of lower extremity   SARS-CoV-2 positive   Neurocognitive deficits  S/p repair of patellar tendon injury LLE 10/6 with Dr. Griffin Basil  Discussed intraop findings during surgery with patient and husband at bedside. Rupture of the patellar tendon partially of the tibial tubercle and inferior pole of the patella. Discussed augmented hamstring repair with Dr. Griffin Basil. No signs of joint infection introap but did send tissue  specimen for culture. Thorough debridement and irrigation was performed with poly exchange. New 57m poly reinserted. Overall tissues of poor quality so will plan for prolonged immobilization in extension. Currently in extension splint.   Post op recs: WB: TDWB Abx:.Marland KitchenCompleted 7 day course of ceftriaxone. Cultures with NGTD Dressing: keep wound vac and splint intact.  DVT prophylaxis: eliquis, okay for full dose anticoagulation if indicated for A.fib. Follow up: 1 week post discharge for wound check [address] [place] [place] [id number] Office Phone: [phone] DWillaim Sheng10/09/2022, 4:48 PM  DCharlies Constable MD  Contact information:   [phone] epic message Dr. MZachery Dakins or call office for patient follow up: [phone] After hours and holidays please check [email or web address] for group call information for [facility]

## 2022-08-21 NOTE — Discharge Summary (Signed)
Physician Discharge Summary  [name] EQA:[id number] DOB: 1946-01-01 DOA: 08/14/2022  PCP: Asencion Noble, MD  Admit date: 08/14/2022 Discharge date: 08/21/2022  Admitted From: SNF Disposition:  SNF  Discharge Condition:Stable CODE STATUS:FULL Diet recommendation:  Regular   Brief/Interim Summary: Patient is a 76 female with history of chronic pain syndrome, stroke, hyperlipidemia, stress incontinence, asthma, GERD, A-fib, DVT, depression, anxiety who presented from orthopedics office for management of left knee fracture.  She had a left total knee replacement on September 11 and was recovering at rehab facility but had a fall while at rehab facility and fractured her patella and her knee .  She was recently diagnosed with COVID on September 25 and was weak and lightheaded and fell.  No evidence of pneumonia as per chest x-ray that was done as an outpatient, she completed outpatient antiviral medication.  Orthopedics were following, status post revision of total left knee arthroplasty, tendon repair with graft on 10/6.  Hospital course remarkable for new onset A-fib, cardiology was also consulted.  Wound culture did not show any growth, she completed 7 days course of ceftriaxone.  Heart rate is well controlled.  She will follow-up with orthopedics and cardiology as an outpatient.  Medically stable for discharge whenever possible.  Following problems were addressed during her hospitalization:  Prosthetic fracture of left knee joint: Fell at [facility].  Status post left total knee replacement on September 11.  Orthopedics following. status post revision of total left knee arthroplasty, tendon repair with graft on 10/6. PT/OT recommending SNF on discharge. Postoperatively, she was requiring oxygen for maintenance of saturation.  Currently on room air.  Chest x-ray did not show any significant findings.  Plan for discharge to SNF as per PT/OT.  TOC has arranged a bed for her, at the moment, we are waiting  for insurance authorization which we hope to receive today and then patient will be discharged.   Suspected cellulitis/leukocytosis: Left lower extremity also appeared erythematous, warm on admission.  No fever.  Started on ceftriaxone with significant  improvement.  Developed  leukocytosis now improving .  Aerobic/anaerobic culture sent on 10/6 is pending without any growth.  Plan for antibiotics for 7 days course,day 7/7   Paroxysmal A-fib: As per the husband, she was diagnosed with paroxysmal A-fib at rehab but not discharged on any medications.  Not on rate control meds.  Went into A-fib with RVR after surgery, treated with Cardizem drip.  Started  on low-dose metoprolol.  Also started on Eliquis,low dose.CHA2DS2VASc of 5.  Cardiology consulted.  Echo showed EF of 55 to 60%, indeterminate left ventricular diastolic parameters.  She will follow-up with cardiology at [facility].   Hypotension/AKI: Postoperatively she went into A-fib, became hypotensive and creatinine also trended up.  Currently normotensive.  AKI resolved   COVID infection not currently treated and not treated during this hospitalization: Tested  positive on September 25.  Completed course of antiviral.  On room air.  Lifted precautions   History of CVA/hyperlipidemia: Not on anything at home   Stress incontinence: Continue home Myrbetriq   Neurocognitive deficits: Continue supportive care , delirium precautions.She is mostly oriented   Depression/anxiety/insomnia: Continue home BuSpar, duloxetine   Bilateral lower extremity edema:  BNP elevated.  Echo showed EF of 55 to 60%, indeterminate diastolic parameters.Significantly improved with lasix.  Now euvolemic  Discharge Diagnoses:  Principal Problem:   Periprosthetic fracture around internal prosthetic left knee joint Active Problems:   Neck pain, chronic   History of CVA (  cerebrovascular accident)   Hyperlipidemia   Asthma   Esophageal reflux   Paroxysmal atrial  fibrillation (HCC)   History of DVT of lower extremity   SARS-CoV-2 positive   Neurocognitive deficits    Discharge Instructions  Discharge Instructions     Diet general   Complete by: As directed    Discharge instructions   Complete by: As directed    1)Please take prescribed medications as instructed 2)Follow up with orthopedics and cardiology as an outpatient.  Name and number the providers have been attached   Increase activity slowly   Complete by: As directed    No wound care   Complete by: As directed       Allergies as of 08/21/2022       Reactions   Ticlid [ticlopidine] Shortness Of Breath   Nsaids Other (See Comments)   Bleeding ulcer   Prednisone Other (See Comments)   Makes her stomach hurt--knows this isn't an allergy but doesn't want to take it   Monistat [miconazole] Swelling, Rash   Sulfa Antibiotics Rash        Medication List     TAKE these medications    acetaminophen 500 MG tablet Commonly known as: TYLENOL Take 1,000 mg by mouth every 6 (six) hours as needed for headache, moderate pain or mild pain.   apixaban 5 MG Tabs tablet Commonly known as: ELIQUIS Take 1 tablet (5 mg total) by mouth 2 (two) times daily.   busPIRone 30 MG tablet Commonly known as: BUSPAR Take 1 tablet (30 mg total) by mouth 2 (two) times daily.   DULoxetine 60 MG capsule Commonly known as: CYMBALTA Take 1 capsule (60 mg total) by mouth 2 (two) times daily. What changed:  how much to take when to take this   eszopiclone 2 MG Tabs tablet Commonly known as: LUNESTA Take immediately before bedtime   HYDROcodone-acetaminophen 5-325 MG tablet Commonly known as: NORCO/VICODIN Take 1 tablet by mouth every 4 (four) hours as needed for moderate pain (pain score 4-6).   LORazepam 0.5 MG tablet Commonly known as: Ativan Take 1 tablet (0.5 mg total) by mouth 2 (two) times daily as needed for anxiety.   metoprolol tartrate 25 MG tablet Commonly known as:  LOPRESSOR Take 1 tablet (25 mg total) by mouth 2 (two) times daily.   Myrbetriq 50 MG Tb24 tablet Generic drug: mirabegron ER Take 50 mg by mouth daily.   polyethylene glycol 17 g packet Commonly known as: MIRALAX / GLYCOLAX Take 17 g by mouth daily as needed.   senna 8.6 MG Tabs tablet Commonly known as: SENOKOT Take 1 tablet (8.6 mg total) by mouth 2 (two) times daily.   SUMAtriptan 100 MG tablet Commonly known as: IMITREX Take 100 mg by mouth every 2 (two) hours as needed for migraine or headache. May repeat in 2 hours if headache persists or recurs.   Vitamin D 125 MCG (5000 UT) Caps Take 5,000 Units by mouth daily.        Follow-up Information     [name], MD Follow up on 08/27/2022.   Specialty: Orthopedic Surgery Contact information: [place] [address] [phone]         Arnoldo Lenis, MD Follow up on 09/03/2022.   Specialty: Cardiology Why: at 1:00pm with Dr. Dellia Cloud cardiology Contact information: [address] [place] [address] [phone]                Allergies  Allergen Reactions   Ticlid [Ticlopidine] Shortness Of Breath   Nsaids Other (See Comments)    Bleeding ulcer   Prednisone Other (See Comments)    Makes her stomach hurt--knows this isn't an allergy but doesn't want to take it    Monistat [Miconazole] Swelling and Rash   Sulfa Antibiotics Rash    Consultations: Cardiology, orthopedics   Procedures/Studies: DG CHEST PORT 1 VIEW  Result Date: 08/16/2022 CLINICAL DATA:  Short of breath EXAM: PORTABLE CHEST 1 VIEW COMPARISON:  Three hundred eighteen FINDINGS: Single frontal view of the chest demonstrates an unremarkable cardiac silhouette. Chronic elevation of the left hemidiaphragm. No acute airspace disease, effusion, or pneumothorax. No acute bony abnormality. IMPRESSION: 1. No acute intrathoracic process. Electronically Signed   By: Randa Ngo M.D.   On:  08/16/2022 20:39   ECHOCARDIOGRAM COMPLETE  Result Date: 08/16/2022    ECHOCARDIOGRAM REPORT   Patient Name:   LIANNAH YARBOUGH Date of Exam: 08/15/2022 Medical Rec #:  [id number]     Height:       65.0 in Accession #:    [id number]    Weight:       206.9 lb Date of Birth:  [birth year]    BSA:          2.007 m Patient Age:    78 years      BP:           121/82 mmHg Patient Gender: F             HR:           115 bpm. Exam Location:  Inpatient Procedure: 2D Echo, Color Doppler and Cardiac Doppler Indications:    CHF  History:        Patient has no prior history of Echocardiogram examinations.                 Stroke; Signs/Symptoms:Murmur.  Sonographer:    Memory Argue Referring Phys: [id number] AMRIT ADHIKARI  Sonographer Comments: Technically difficult study due to poor echo windows. IMPRESSIONS  1. Poor quality exam. Consider repeat exam when HR is better controlled.  2. Left ventricular ejection fraction, by estimation, is 55 to 60%. The left ventricle has normal function. Left ventricular endocardial border not optimally defined to evaluate regional wall motion. Left ventricular diastolic parameters are indeterminate. Definity contrast not used.  3. Right ventricular systolic function is normal. The right ventricular size is normal. There is normal pulmonary artery systolic pressure.  4. Left atrial size was moderately dilated.  5. The mitral valve is grossly normal. Trivial mitral valve regurgitation. No evidence of mitral stenosis.  6. The aortic valve was not well visualized. Aortic valve regurgitation is not visualized. Aortic valve sclerosis is present, with no evidence of aortic valve stenosis.  7. The inferior vena cava is normal in size with <50% respiratory variability, suggesting right atrial pressure of 8 mmHg. FINDINGS  Left Ventricle: Left ventricular ejection fraction, by estimation, is 55 to 60%. The left ventricle has normal function. Left ventricular endocardial border not optimally defined to  evaluate regional wall motion. The left ventricular internal cavity size was normal in size. There is no left ventricular hypertrophy. Left ventricular diastolic parameters are indeterminate. Right Ventricle: The right ventricular size is normal. No increase in right ventricular wall thickness. Right ventricular systolic function is normal. There is normal pulmonary artery systolic pressure. The tricuspid regurgitant velocity is 2.19 m/s, and  with an assumed right atrial pressure of 8 mmHg, the  estimated right ventricular systolic pressure is 21.1 mmHg. Left Atrium: Left atrial size was moderately dilated. Right Atrium: Right atrial size was normal in size. Pericardium: There is no evidence of pericardial effusion. Mitral Valve: The mitral valve is grossly normal. Trivial mitral valve regurgitation. No evidence of mitral valve stenosis. Tricuspid Valve: The tricuspid valve is not well visualized. Tricuspid valve regurgitation is trivial. No evidence of tricuspid stenosis. Aortic Valve: The aortic valve was not well visualized. Aortic valve regurgitation is not visualized. Aortic valve sclerosis is present, with no evidence of aortic valve stenosis. Aortic valve mean gradient measures 7.0 mmHg. Aortic valve peak gradient measures 13.1 mmHg. Aortic valve area, by VTI measures 2.00 cm. Pulmonic Valve: The pulmonic valve was not assessed. Pulmonic valve regurgitation is not visualized. No evidence of pulmonic stenosis. Aorta: The ascending aorta was not well visualized. Venous: The inferior vena cava is normal in size with less than 50% respiratory variability, suggesting right atrial pressure of 8 mmHg. IAS/Shunts: No atrial level shunt detected by color flow Doppler.  LEFT VENTRICLE PLAX 2D LVIDd:         4.00 cm LVIDs:         2.60 cm LV PW:         1.00 cm LV IVS:        1.00 cm LVOT diam:     2.00 cm LV SV:         55 LV SV Index:   28 LVOT Area:     3.14 cm  RIGHT VENTRICLE TAPSE (M-mode): 2.0 cm LEFT ATRIUM              Index        RIGHT ATRIUM           Index LA diam:        3.20 cm 1.59 cm/m   RA Area:     15.10 cm LA Vol (A2C):   80.8 ml 40.24 ml/m  RA Volume:   38.50 ml  19.18 ml/m LA Vol (A4C):   95.9 ml 47.79 ml/m LA Biplane Vol: 95.6 ml 47.64 ml/m  AORTIC VALVE AV Area (Vmax):    1.89 cm AV Area (Vmean):   1.92 cm AV Area (VTI):     2.00 cm AV Vmax:           181.00 cm/s AV Vmean:          124.000 cm/s AV VTI:            0.277 m AV Peak Grad:      13.1 mmHg AV Mean Grad:      7.0 mmHg LVOT Vmax:         109.00 cm/s LVOT Vmean:        75.800 cm/s LVOT VTI:          0.176 m LVOT/AV VTI ratio: 0.64  AORTA Ao Root diam: 3.60 cm MR Peak grad: 37.5 mmHg   TRICUSPID VALVE MR Vmax:      306.00 cm/s TR Peak grad:   19.2 mmHg                           TR Vmax:        219.00 cm/s                            SHUNTS  Systemic VTI:  0.18 m                           Systemic Diam: 2.00 cm Cherlynn Kaiser MD Electronically signed by Cherlynn Kaiser MD Signature Date/Time: 08/16/2022/6:03:32 AM    Final    DG Knee Left Port  Result Date: 07/22/2022 CLINICAL DATA:  Postoperative total knee arthroplasty EXAM: PORTABLE LEFT KNEE - 1-2 VIEW COMPARISON:  None Available. FINDINGS: Status post left knee total arthroplasty with expected overlying postoperative change. No evidence of perihardware fracture or component malpositioning. IMPRESSION: Status post left knee total arthroplasty with expected overlying postoperative change. No evidence of perihardware fracture or component malpositioning. Electronically Signed   By: Delanna Ahmadi M.D.   On: 07/22/2022 13:18      Subjective:  Patient seen and examined.  She has no complaints.  Discharge Exam: Vitals:   08/21/22 0827 08/21/22 0828  BP: (!) 119/90   Pulse:  (!) 104  Resp:    Temp:    SpO2:     Vitals:   08/21/22 0549 08/21/22 0802 08/21/22 0827 08/21/22 0828  BP:   (!) 119/90   Pulse: (!) 103   (!) 104  Resp:      Temp:  98 F  (36.7 C)    TempSrc:      SpO2:  96%    Weight:      Height:       General exam: Appears calm and comfortable  Respiratory system: Clear to auscultation. Respiratory effort normal. Cardiovascular system: S1 & S2 heard, RRR. No JVD, murmurs, rubs, gallops or clicks. No pedal edema. Gastrointestinal system: Abdomen is nondistended, soft and nontender. No organomegaly or masses felt. Normal bowel sounds heard. Central nervous system: Alert and oriented. No focal neurological deficits. Skin: No rashes, lesions or ulcers.  Psychiatry: Judgement and insight appear normal. Mood & affect appropriate.    The results of significant diagnostics from this hospitalization (including imaging, microbiology, ancillary and laboratory) are listed below for reference.     Microbiology: Recent Results (from the past 240 hour(s))  Surgical pcr screen     Status: None   Collection Time: 08/16/22  7:00 AM   Specimen: Nasal Mucosa; Nasal Swab  Result Value Ref Range Status   MRSA, PCR NEGATIVE NEGATIVE Final   Staphylococcus aureus NEGATIVE NEGATIVE Final    Comment: (NOTE) The Xpert SA Assay (FDA approved for NASAL specimens in patients 63 years of age and older), is one component of a comprehensive surveillance program. It is not intended to diagnose infection nor to guide or monitor treatment. Performed at [facility], [place] [address], [place], [place] [address]   Surgical pcr screen     Status: None   Collection Time: 08/16/22 11:19 AM   Specimen: Nasal Mucosa; Nasal Swab  Result Value Ref Range Status   MRSA, PCR NEGATIVE NEGATIVE Final   Staphylococcus aureus NEGATIVE NEGATIVE Final    Comment: (NOTE) The Xpert SA Assay (FDA approved for NASAL specimens in patients 16 years of age and older), is one component of a comprehensive surveillance program. It is not intended to diagnose infection nor to guide or monitor treatment. Performed at [facility], [place] [address]  [place], [place] [address]   Aerobic/Anaerobic Culture w Gram Stain (surgical/deep wound)     Status: None (Preliminary result)   Collection Time: 08/16/22  2:01 PM   Specimen: Other Source; Tissue  Result Value  Ref Range Status   Specimen Description KNEE LEFT  Final   Special Requests SAMPLE NO 1  Final   Gram Stain NO WBC SEEN NO ORGANISMS SEEN   Final   Culture   Final    NO GROWTH 4 DAYS NO ANAEROBES ISOLATED; CULTURE IN PROGRESS FOR 5 DAYS Performed at [facility], [address] [place], [place] [address]    Report Status PENDING  Incomplete  Aerobic/Anaerobic Culture w Gram Stain (surgical/deep wound)     Status: None (Preliminary result)   Collection Time: 08/16/22  2:02 PM   Specimen: Other Source; Tissue  Result Value Ref Range Status   Specimen Description TISSUE LEFT KNEE  Final   Special Requests SAMPLE NO 2  Final   Gram Stain   Final    WBC PRESENT, PREDOMINANTLY MONONUCLEAR NO ORGANISMS SEEN    Culture   Final    NO GROWTH 4 DAYS NO ANAEROBES ISOLATED; CULTURE IN PROGRESS FOR 5 DAYS Performed at [facility] [address], [place], [place] [address]    Report Status PENDING  Incomplete  Aerobic/Anaerobic Culture w Gram Stain (surgical/deep wound)     Status: None (Preliminary result)   Collection Time: 08/16/22  2:02 PM   Specimen: Other Source; Tissue  Result Value Ref Range Status   Specimen Description TISSUE LEFT KNEE  Final   Special Requests SAMPLE NO 3  Final   Gram Stain   Final    WBC PRESENT, PREDOMINANTLY MONONUCLEAR NO ORGANISMS SEEN    Culture   Final    NO GROWTH 4 DAYS NO ANAEROBES ISOLATED; CULTURE IN PROGRESS FOR 5 DAYS Performed at [facility] [address], [place], [place] [address]    Report Status PENDING  Incomplete     Labs: BNP (last 3 results) Recent Labs    08/15/22 1207  BNP 154.2*    Basic Metabolic Panel: Recent Labs  Lab 08/15/22 0254 08/16/22 0241 08/17/22 0221 08/18/22 0316  08/19/22 0305  NA 138 139 135 135 136  K 3.7 3.6 3.8 3.9 3.9  CL 101 98 93* 95* 99  CO2 '27 30 28 28 28  '$ GLUCOSE 99 103* 132* 121* 107*  BUN '8 9 13 '$ 24* 22  CREATININE 0.60 0.69 1.32* 1.33* 0.70  CALCIUM 9.0 9.1 8.8* 8.8* 8.9    Liver Function Tests: No results for input(s): "AST", "ALT", "ALKPHOS", "BILITOT", "PROT", "ALBUMIN" in the last 168 hours. No results for input(s): "LIPASE", "AMYLASE" in the last 168 hours. No results for input(s): "AMMONIA" in the last 168 hours. CBC: Recent Labs  Lab 08/15/22 0254 08/17/22 0221 08/18/22 0316 08/19/22 0305 08/20/22 0120  WBC 7.1 16.9* 15.6* 14.1* 10.9*  HGB 10.4* 10.8* 9.8* 10.0* 9.5*  HCT 31.1* 34.2* 31.0* 31.9* 30.6*  MCV 94.5 96.1 96.6 97.9 97.1  PLT 319 387 331 312 337    Cardiac Enzymes: No results for input(s): "CKTOTAL", "CKMB", "CKMBINDEX", "TROPONINI" in the last 168 hours. BNP: Invalid input(s): "POCBNP" CBG: No results for input(s): "GLUCAP" in the last 168 hours. D-Dimer No results for input(s): "DDIMER" in the last 72 hours. Hgb A1c No results for input(s): "HGBA1C" in the last 72 hours. Lipid Profile No results for input(s): "CHOL", "HDL", "LDLCALC", "TRIG", "CHOLHDL", "LDLDIRECT" in the last 72 hours. Thyroid function studies No results for input(s): "TSH", "T4TOTAL", "T3FREE", "THYROIDAB" in the last 72 hours.  Invalid input(s): "FREET3" Anemia work up No results for input(s): "VITAMINB12", "FOLATE", "FERRITIN", "TIBC", "IRON", "RETICCTPCT"  in the last 72 hours. Urinalysis No results found for: "COLORURINE", "APPEARANCEUR", "LABSPEC", "PHURINE", "GLUCOSEU", "HGBUR", "BILIRUBINUR", "KETONESUR", "PROTEINUR", "UROBILINOGEN", "NITRITE", "LEUKOCYTESUR" Sepsis Labs Recent Labs  Lab 08/17/22 0221 08/18/22 0316 08/19/22 0305 08/20/22 0120  WBC 16.9* 15.6* 14.1* 10.9*    Microbiology Recent Results (from the past 240 hour(s))  Surgical pcr screen     Status: None   Collection Time: 08/16/22  7:00 AM    Specimen: Nasal Mucosa; Nasal Swab  Result Value Ref Range Status   MRSA, PCR NEGATIVE NEGATIVE Final   Staphylococcus aureus NEGATIVE NEGATIVE Final    Comment: (NOTE) The Xpert SA Assay (FDA approved for NASAL specimens in patients 81 years of age and older), is one component of a comprehensive surveillance program. It is not intended to diagnose infection nor to guide or monitor treatment. Performed at [facility], [place] [address] [place], [place] [address]   Surgical pcr screen     Status: None   Collection Time: 08/16/22 11:19 AM   Specimen: Nasal Mucosa; Nasal Swab  Result Value Ref Range Status   MRSA, PCR NEGATIVE NEGATIVE Final   Staphylococcus aureus NEGATIVE NEGATIVE Final    Comment: (NOTE) The Xpert SA Assay (FDA approved for NASAL specimens in patients 31 years of age and older), is one component of a comprehensive surveillance program. It is not intended to diagnose infection nor to guide or monitor treatment. Performed at [facility], [place] [address], [place], [place] [address]   Aerobic/Anaerobic Culture w Gram Stain (surgical/deep wound)     Status: None (Preliminary result)   Collection Time: 08/16/22  2:01 PM   Specimen: Other Source; Tissue  Result Value Ref Range Status   Specimen Description KNEE LEFT  Final   Special Requests SAMPLE NO 1  Final   Gram Stain NO WBC SEEN NO ORGANISMS SEEN   Final   Culture   Final    NO GROWTH 4 DAYS NO ANAEROBES ISOLATED; CULTURE IN PROGRESS FOR 5 DAYS Performed at [facility], [address], [place], [place] [address]    Report Status PENDING  Incomplete  Aerobic/Anaerobic Culture w Gram Stain (surgical/deep wound)     Status: None (Preliminary result)   Collection Time: 08/16/22  2:02 PM   Specimen: Other Source; Tissue  Result Value Ref Range Status   Specimen Description TISSUE LEFT KNEE  Final   Special Requests SAMPLE NO 2  Final   Gram Stain   Final    WBC PRESENT, PREDOMINANTLY  MONONUCLEAR NO ORGANISMS SEEN    Culture   Final    NO GROWTH 4 DAYS NO ANAEROBES ISOLATED; CULTURE IN PROGRESS FOR 5 DAYS Performed at [facility] [address], [place], [place] [address]    Report Status PENDING  Incomplete  Aerobic/Anaerobic Culture w Gram Stain (surgical/deep wound)     Status: None (Preliminary result)   Collection Time: 08/16/22  2:02 PM   Specimen: Other Source; Tissue  Result Value Ref Range Status   Specimen Description TISSUE LEFT KNEE  Final   Special Requests SAMPLE NO 3  Final   Gram Stain   Final    WBC PRESENT, PREDOMINANTLY MONONUCLEAR NO ORGANISMS SEEN    Culture   Final    NO GROWTH 4 DAYS NO ANAEROBES ISOLATED; CULTURE IN PROGRESS FOR 5 DAYS Performed at [facility] [address], [place], [place] [address]    Report Status PENDING  Incomplete  Please note: You were cared for by a hospitalist during your hospital stay. Once you are discharged, your primary care physician will handle any further medical issues. Please note that NO REFILLS for any discharge medications will be authorized once you are discharged, as it is imperative that you return to your primary care physician (or establish a relationship with a primary care physician if you do not have one) for your post hospital discharge needs so that they can reassess your need for medications and monitor your lab values.    Time coordinating discharge: 40 minutes  SIGNED:   Darliss Cheney, MD  [facility] 08/21/2022, 9:41 AM  If 7PM-7AM, please contact night-coverage [email or web address] Password [email or web address]

## 2022-08-21 NOTE — TOC Progression Note (Signed)
Transition of Care [facility]) - Progression Note    Patient Details  Name: [name] MRN: [id number] Date of Birth: Sep 14, 1946  Transition of Care [facility]) CM/SW Contact  Joanne Chars, LCSW Phone Number: 08/21/2022, 11:39 AM  Clinical Narrative:   Ronnette Juniper, HTA.  Asking if pt still has wound vac.  Auth still be reviewed.    Expected Discharge Plan: [place] Barriers to Discharge: Continued Medical Work up  Expected Discharge Plan and Services Expected Discharge Plan: [place] In-house Referral: Clinical Social Work   Post Acute Care Choice: [place] Living arrangements for the past 2 months: Single Family Home Expected Discharge Date: 08/21/22                                     Social Determinants of Health (SDOH) Interventions Food Insecurity Interventions: Intervention Not Indicated Housing Interventions: Intervention Not Indicated Transportation Interventions: Intervention Not Indicated Utilities Interventions: Intervention Not Indicated  Readmission Risk Interventions     No data to display

## 2022-08-22 ENCOUNTER — Inpatient Hospital Stay (HOSPITAL_COMMUNITY): Payer: PPO

## 2022-08-22 DIAGNOSIS — M9712XA Periprosthetic fracture around internal prosthetic left knee joint, initial encounter: Secondary | ICD-10-CM | POA: Diagnosis not present

## 2022-08-22 MED ORDER — HYDROCODONE-ACETAMINOPHEN 5-325 MG PO TABS: 1.0000 | ORAL_TABLET | ORAL | Status: DC | PRN

## 2022-08-22 NOTE — Progress Notes (Signed)
     Subjective: Patient seen this morning. Lying comforatbly in bed. Moves BLE better today. Has not yet been out of bed. No family at bedside. Encouraged her to continue to try to work on mobility and exercises for her lower extremities. Pain well controlled. No new issues.  Objective:   VITALS:   Vitals:   08/22/22 0504 08/22/22 0510 08/22/22 0632 08/22/22 0800  BP: (!) 96/56   128/86  Pulse: (!) 102 98 73 88  Resp:    13  Temp: 97.7 F (36.5 C)   99 F (37.2 C)  TempSrc: Oral     SpO2: 96%   99%  Weight:      Height:       LLE: Sensation intact distally Dorsiflexion/Plantar flexion intact Incision: prevena wound vac holding suction no output in cannister Compartment soft   Lab Results  Component Value Date   WBC 10.9 (H) 08/20/2022   HGB 9.5 (L) 08/20/2022   HCT 30.6 (L) 08/20/2022   MCV 97.1 08/20/2022   PLT 337 08/20/2022   BMET    Component Value Date/Time   NA 136 08/19/2022 0305   NA 140 05/27/2017 1553   K 3.9 08/19/2022 0305   CL 99 08/19/2022 0305   CO2 28 08/19/2022 0305   GLUCOSE 107 (H) 08/19/2022 0305   BUN 22 08/19/2022 0305   BUN 15 05/27/2017 1553   CREATININE 0.70 08/19/2022 0305   CALCIUM 8.9 08/19/2022 0305   GFRNONAA >60 08/19/2022 0305    Assessment/Plan: 6 Days Post-Op   Principal Problem:   Periprosthetic fracture around internal prosthetic left knee joint Active Problems:   Neck pain, chronic   History of CVA (cerebrovascular accident)   Hyperlipidemia   Asthma   Esophageal reflux   Paroxysmal atrial fibrillation (HCC)   History of DVT of lower extremity   SARS-CoV-2 positive   Neurocognitive deficits  S/p repair of patellar tendon injury LLE 10/6 with Dr. Griffin Basil  Discussed intraop findings during surgery with patient and husband at bedside. Rupture of the patellar tendon partially of the tibial tubercle and inferior pole of the patella. Discussed augmented hamstring repair with Dr. Griffin Basil. No signs of joint infection  introap but did send tissue specimen for culture. Thorough debridement and irrigation was performed with poly exchange. New 60m poly reinserted. Overall tissues of poor quality so will plan for prolonged immobilization in extension. Currently in extension splint.   Post op recs: WB: TDWB Abx:.Marland KitchenCompleted 7 day course of ceftriaxone. Cultures with NGTD Dressing: keep wound vac and splint intact.  DVT prophylaxis: eliquis, okay for full dose anticoagulation if indicated for A.fib. Follow up: 1 week post discharge for wound check Address: [address] [place] [address] Office Phone: ([phone] DWillaim Sheng10/10/2022, 12:55 PM  DCharlies Constable MD  Contact information:   [phone] epic message Dr. MZachery Dakins or call office for patient follow up: [phone] After hours and holidays please check [email or web address] for group call information for [facility]

## 2022-08-22 NOTE — TOC Progression Note (Addendum)
Transition of Care [facility]) - Progression Note    Patient Details  Name: [name] MRN: [id number] Date of Birth: December 22, 1945  Transition of Care [facility]) CM/SW Contact  Joanne Chars, LCSW Phone Number: 08/22/2022, 10:15 AM  Clinical Narrative:   CSW LM with [employer] asking for update on SNF auth.  1025: TC Tammie/[facility].  S[facility] has been denied.  They will reconsider once pt is weight bearing.  Peer to peer offered with Dr Amalia Hailey, [phone] by 12noon today.  MD informed.  CSW spoke with husband.  He does not have wide enough doorway to use wheelchair around the house--cannot get in, out, or to the bathroom.  He does want to appeal this decision.  1120: MD declines peer to peer.  [facility] will fax over denial letter with appeal information.    1450: denial received, spoke with pt husband by phone, he is on his way to the hospital, will place paperwork in pt room and he will work on the appeal.  Pt son Larkin Ina in room, paperwork given to him, he will work on the appeal with pt husband.   Expected Discharge Plan: [place] Barriers to Discharge: Continued Medical Work up  Expected Discharge Plan and Services Expected Discharge Plan: [place] In-house Referral: Clinical Social Work   Post Acute Care Choice: [place] Living arrangements for the past 2 months: Single Family Home Expected Discharge Date: 08/21/22                                     Social Determinants of Health (SDOH) Interventions Food Insecurity Interventions: Intervention Not Indicated Housing Interventions: Intervention Not Indicated Transportation Interventions: Intervention Not Indicated Utilities Interventions: Intervention Not Indicated  Readmission Risk Interventions     No data to display

## 2022-08-22 NOTE — Progress Notes (Addendum)
PROGRESS NOTE    [name]  FTD:[id number] DOB: October 21, 1946 DOA: 08/14/2022 PCP: Asencion Noble, MD   Brief Narrative:  Patient is a 76 female with history of chronic pain syndrome, stroke, hyperlipidemia, stress incontinence, asthma, GERD, A-fib, DVT, depression, anxiety who presented from orthopedics office for management of left knee fracture.  She had a left total knee replacement on September 11 and was recovering at rehab facility but had a fall while at rehab facility and fractured her patella and her knee .  She was recently diagnosed with COVID on September 25 and was weak and lightheaded and fell.  No evidence of pneumonia as per chest x-ray that was done as an outpatient, she completed outpatient antiviral medication.  Orthopedics were following, status post revision of total left knee arthroplasty, tendon repair with graft on 10/6.  Hospital course remarkable for new onset A-fib, cardiology was also consulted.  Wound culture did not show any growth, she completed 7 days course of ceftriaxone.  Heart rate is well controlled.  She will follow-up with orthopedics and cardiology as an outpatient.  Medically stable for discharge whenever possible.    Assessment & Plan:   Principal Problem:   Periprosthetic fracture around internal prosthetic left knee joint Active Problems:   Neck pain, chronic   History of CVA (cerebrovascular accident)   Hyperlipidemia   Asthma   Esophageal reflux   Paroxysmal atrial fibrillation (HCC)   History of DVT of lower extremity   SARS-CoV-2 positive   Neurocognitive deficits  Prosthetic fracture of left knee joint: Fell at [facility].  Status post left total knee replacement on September 11.  Orthopedics following. status post revision of total left knee arthroplasty, tendon repair with graft on 10/6. PT/OT recommending SNF on discharge. Postoperatively, she was requiring oxygen for maintenance of saturation.  Currently on room air.  Chest x-ray did not show  any significant findings.  Plan for discharge to SNF as per PT/OT.  TOC has arranged a bed for her however her insurance declined.  Family plans to appeal.   Suspected cellulitis/leukocytosis: Left lower extremity also appeared erythematous, warm on admission.  No fever.  Started on ceftriaxone with significant  improvement.  Developed  leukocytosis now improving .  Aerobic/anaerobic culture sent on 10/6 is pending without any growth.  She completed 7 days of antibiotics.   Paroxysmal A-fib: As per the husband, she was diagnosed with paroxysmal A-fib at rehab but not discharged on any medications.  Not on rate control meds.  Went into A-fib with RVR after surgery, treated with Cardizem drip.  Started  on low-dose metoprolol.  Also started on Eliquis,low dose.CHA2DS2VASc of 5.  Cardiology consulted.  Echo showed EF of 55 to 60%, indeterminate left ventricular diastolic parameters.  She will follow-up with cardiology at [facility].  Has intermittent episodes of atrial fibrillation with RVR but currently stable.  Likely anxiety driven.   Hypotension/AKI: Postoperatively she went into A-fib, became hypotensive and creatinine also trended up.  Currently normotensive.  AKI resolved   COVID infection not currently treated and not treated during this hospitalization: Tested  positive on September 25.  Completed course of antiviral.  On room air.  Lifted precautions   History of CVA/hyperlipidemia: Not on anything at home   Stress incontinence: Continue home Myrbetriq   Neurocognitive deficits: Continue supportive care , delirium precautions.She is mostly oriented   Depression/anxiety/insomnia: Continue home BuSpar, duloxetine   Bilateral lower extremity edema:  BNP elevated.  Echo showed EF of 55  to 60%, indeterminate diastolic parameters.Significantly improved with lasix.  Now euvolemic  Right lower extremity weakness: I was informed by orthopedics yesterday that patient was not able to move her right  lower extremity and thus stroke was suspected and thus CT head was obtained which showed some suspicion of possible acute versus chronic stroke however on my examination today, patient is able to lift her right lower extremity without any trouble but MRI is ordered to find out the acuity and the duration of a stroke.  DVT prophylaxis: SCDs Start: 08/16/22 1703 SCDs Start: 08/14/22 1906   Code Status: Full Code  Family Communication:  None present at bedside.  Plan of care discussed with patient in length and he/she verbalized understanding and agreed with it.  Status is: Inpatient Remains inpatient appropriate because: Medically stable, pending placement.   Estimated body mass index is 31.62 kg/m as calculated from the following:   Height as of this encounter: '5\' 5"'$  (1.651 m).   Weight as of this encounter: 86.2 kg.    Nutritional Assessment: Body mass index is 31.62 kg/m.Marland Kitchen Seen by dietician.  I agree with the assessment and plan as outlined below: Nutrition Status:        . Skin Assessment: I have examined the patient's skin and I agree with the wound assessment as performed by the wound care RN as outlined below:    Consultants:  Ortho  Procedures:  Above  Antimicrobials:  Anti-infectives (From admission, onward)    Start     Dose/Rate Route Frequency Ordered Stop   08/20/22 1100  cefTRIAXone (ROCEPHIN) 1 g in sodium chloride 0.9 % 100 mL IVPB        1 g 200 mL/hr over 30 Minutes Intravenous  Once 08/20/22 1047 08/20/22 1310   08/16/22 1800  ceFAZolin (ANCEF) IVPB 2g/100 mL premix  Status:  Discontinued        2 g 200 mL/hr over 30 Minutes Intravenous Every 6 hours 08/16/22 1702 08/16/22 1704   08/16/22 1523  vancomycin (VANCOCIN) powder  Status:  Discontinued          As needed 08/16/22 1523 08/16/22 1531   08/16/22 0745  ceFAZolin (ANCEF) IVPB 2g/100 mL premix        2 g 200 mL/hr over 30 Minutes Intravenous On call to O.R. 08/16/22 5397 08/16/22 1321    08/14/22 2100  cefTRIAXone (ROCEPHIN) 1 g in sodium chloride 0.9 % 100 mL IVPB  Status:  Discontinued        1 g 200 mL/hr over 30 Minutes Intravenous Every 24 hours 08/14/22 2011 08/20/22 1047         Subjective: Seen and examined.  Patient alert and oriented.  Has no complaints.  I was notified yesterday by Dr. Zachery Dakins patient was not moving her right lower extremity however on my exam, she is able to lift her right lower extremity without any weakness.  Due to concern of a stroke, CT head was obtained yesterday which showed possibility of new stroke and thus MRI is ordered today.  Objective: Vitals:   08/22/22 0504 08/22/22 0510 08/22/22 0632 08/22/22 0800  BP: (!) 96/56   128/86  Pulse: (!) 102 98 73 88  Resp:    13  Temp: 97.7 F (36.5 C)   99 F (37.2 C)  TempSrc: Oral     SpO2: 96%   99%  Weight:      Height:        Intake/Output Summary (Last 24 hours) at  08/22/2022 1124 Last data filed at 08/22/2022 0900 Gross per 24 hour  Intake 120 ml  Output 201 ml  Net -81 ml   Filed Weights   08/16/22 0554 08/16/22 1054  Weight: 86.2 kg 86.2 kg    Examination:  General exam: Appears calm and comfortable  Respiratory system: Clear to auscultation. Respiratory effort normal. Cardiovascular system: S1 & S2 heard, RRR. No JVD, murmurs, rubs, gallops or clicks. No pedal edema. Gastrointestinal system: Abdomen is nondistended, soft and nontender. No organomegaly or masses felt. Normal bowel sounds heard. Central nervous system: Alert and oriented. No focal neurological deficits.  Data Reviewed: I have personally reviewed following labs and imaging studies  CBC: Recent Labs  Lab 08/17/22 0221 08/18/22 0316 08/19/22 0305 08/20/22 0120  WBC 16.9* 15.6* 14.1* 10.9*  HGB 10.8* 9.8* 10.0* 9.5*  HCT 34.2* 31.0* 31.9* 30.6*  MCV 96.1 96.6 97.9 97.1  PLT 387 331 312 240   Basic Metabolic Panel: Recent Labs  Lab 08/16/22 0241 08/17/22 0221 08/18/22 0316  08/19/22 0305  NA 139 135 135 136  K 3.6 3.8 3.9 3.9  CL 98 93* 95* 99  CO2 '30 28 28 28  '$ GLUCOSE 103* 132* 121* 107*  BUN 9 13 24* 22  CREATININE 0.69 1.32* 1.33* 0.70  CALCIUM 9.1 8.8* 8.8* 8.9   GFR: Estimated Creatinine Clearance: 66.9 mL/min (by C-G formula based on SCr of 0.7 mg/dL). Liver Function Tests: No results for input(s): "AST", "ALT", "ALKPHOS", "BILITOT", "PROT", "ALBUMIN" in the last 168 hours. No results for input(s): "LIPASE", "AMYLASE" in the last 168 hours. No results for input(s): "AMMONIA" in the last 168 hours. Coagulation Profile: No results for input(s): "INR", "PROTIME" in the last 168 hours. Cardiac Enzymes: No results for input(s): "CKTOTAL", "CKMB", "CKMBINDEX", "TROPONINI" in the last 168 hours. BNP (last 3 results) No results for input(s): "PROBNP" in the last 8760 hours. HbA1C: No results for input(s): "HGBA1C" in the last 72 hours. CBG: No results for input(s): "GLUCAP" in the last 168 hours. Lipid Profile: No results for input(s): "CHOL", "HDL", "LDLCALC", "TRIG", "CHOLHDL", "LDLDIRECT" in the last 72 hours. Thyroid Function Tests: No results for input(s): "TSH", "T4TOTAL", "FREET4", "T3FREE", "THYROIDAB" in the last 72 hours. Anemia Panel: No results for input(s): "VITAMINB12", "FOLATE", "FERRITIN", "TIBC", "IRON", "RETICCTPCT" in the last 72 hours. Sepsis Labs: No results for input(s): "PROCALCITON", "LATICACIDVEN" in the last 168 hours.  Recent Results (from the past 240 hour(s))  Surgical pcr screen     Status: None   Collection Time: 08/16/22  7:00 AM   Specimen: Nasal Mucosa; Nasal Swab  Result Value Ref Range Status   MRSA, PCR NEGATIVE NEGATIVE Final   Staphylococcus aureus NEGATIVE NEGATIVE Final    Comment: (NOTE) The Xpert SA Assay (FDA approved for NASAL specimens in patients 50 years of age and older), is one component of a comprehensive surveillance program. It is not intended to diagnose infection nor to guide or monitor  treatment. Performed at [facility], [place] [address], [place],  [address]   Surgical pcr screen     Status: None   Collection Time: 08/16/22 11:19 AM   Specimen: Nasal Mucosa; Nasal Swab  Result Value Ref Range Status   MRSA, PCR NEGATIVE NEGATIVE Final   Staphylococcus aureus NEGATIVE NEGATIVE Final    Comment: (NOTE) The Xpert SA Assay (FDA approved for NASAL specimens in patients 66 years of age and older), is one component of a comprehensive surveillance program. It is not intended to  diagnose infection nor to guide or monitor treatment. Performed at [facility], [place] [address], [place], [place] [address]   Aerobic/Anaerobic Culture w Gram Stain (surgical/deep wound)     Status: None   Collection Time: 08/16/22  2:01 PM   Specimen: Other Source; Tissue  Result Value Ref Range Status   Specimen Description KNEE LEFT  Final   Special Requests SAMPLE NO 1  Final   Gram Stain NO WBC SEEN NO ORGANISMS SEEN   Final   Culture   Final    No growth aerobically or anaerobically. Performed at [facility], [place] [address], [place], [place] [address]    Report Status 08/21/2022 FINAL  Final  Aerobic/Anaerobic Culture w Gram Stain (surgical/deep wound)     Status: None   Collection Time: 08/16/22  2:02 PM   Specimen: Other Source; Tissue  Result Value Ref Range Status   Specimen Description TISSUE LEFT KNEE  Final   Special Requests SAMPLE NO 2  Final   Gram Stain   Final    WBC PRESENT, PREDOMINANTLY MONONUCLEAR NO ORGANISMS SEEN    Culture   Final    No growth aerobically or anaerobically. Performed at [facility], [place] [address], [place], [place] [address]    Report Status 08/21/2022 FINAL  Final  Aerobic/Anaerobic Culture w Gram Stain (surgical/deep wound)     Status: None   Collection Time: 08/16/22  2:02 PM   Specimen: Other Source; Tissue  Result Value Ref Range Status   Specimen Description TISSUE LEFT KNEE  Final   Special Requests  SAMPLE NO 3  Final   Gram Stain   Final    WBC PRESENT, PREDOMINANTLY MONONUCLEAR NO ORGANISMS SEEN    Culture   Final    No growth aerobically or anaerobically. Performed at [facility], [place] [address] [place], [place] [address]    Report Status 08/21/2022 FINAL  Final     Radiology Studies: CT HEAD WO CONTRAST (5MM)  Result Date: 08/21/2022 CLINICAL DATA:  Neuro deficit, acute, stroke suspected EXAM: CT HEAD WITHOUT CONTRAST TECHNIQUE: Contiguous axial images were obtained from the base of the skull through the vertex without intravenous contrast. RADIATION DOSE REDUCTION: This exam was performed according to the departmental dose-optimization program which includes automated exposure control, adjustment of the mA and/or kV according to patient size and/or use of iterative reconstruction technique. COMPARISON:  Brain MRI 06/09/2017 FINDINGS: Brain: Possible sulcal effacement and loss of gray-white differentiation in the posterior right frontal lobe series 3, image 20, concerning for subacute infarct. Mild periventricular and deep white matter hypodensity typical of chronic small vessel ischemia. No intracranial hemorrhage, mass effect, or midline shift. No hydrocephalus. The basilar cisterns are patent. No extra-axial or intracranial fluid collection. Vascular: No hyperdense vessel or unexpected calcification. Skull: No fracture or focal lesion. Sinuses/Orbits: Paranasal sinuses and mastoid air cells are clear. The visualized orbits are unremarkable. Other: None. IMPRESSION: 1. Possible sulcal effacement and loss of gray-white differentiation in the posterior right frontal lobe may represent subacute infarct versus artifact. Consider further evaluation with MRI. 2. Mild chronic small vessel ischemia. These results will be called to the ordering clinician or representative by the Radiologist Assistant, and communication documented in the PACS or [employer]. Electronically Signed   By:  Keith Rake M.D.   On: 08/21/2022 17:56    Scheduled Meds:  acetaminophen  650 mg Oral Q8H   apixaban  5 mg Oral BID  busPIRone  30 mg Oral BID   docusate sodium  100 mg Oral BID   DULoxetine  120 mg Oral QHS   metoprolol tartrate  25 mg Oral BID   mirabegron ER  50 mg Oral Daily   pantoprazole  40 mg Oral Daily   polyethylene glycol  17 g Oral Daily   senna  1 tablet Oral BID   sodium chloride flush  3 mL Intravenous Q12H   Continuous Infusions:  methocarbamol (ROBAXIN) IV     sodium chloride 500 mL/hr at 08/19/22 0334     LOS: 8 days   Darliss Cheney, MD [facility]  08/22/2022, 11:24 AM   *Please note that this is a verbal dictation therefore any spelling or grammatical errors are due to the "[employer]" system interpretation.  Please page via [facility] and do not message via secure chat for urgent patient care matters. Secure chat can be used for non urgent patient care matters.  How to contact the [facility] Attending or Consulting provider [place] or covering provider during after hours [place], for this patient?  Check the care team in [facility] and look for a) attending/consulting TR[facility] provider listed and b) the [facility] team listed. Page or secure chat [phone]. Log into [email or web address] and use [place] universal password to access. If you do not have the password, please contact the hospital operator. Locate the [facility] provider you are looking for under [facility] and page to a number that you can be directly reached. If you still have difficulty reaching the provider, please page the [facility] (Director on Call) for the Hospitalists listed on amion for assistance.

## 2022-08-22 NOTE — Progress Notes (Addendum)
Occupational Therapy Treatment Patient Details Name: [name] MRN: [id number] DOB: October 23, 1946 Today's Date: 08/22/2022   History of present illness 76 y/o female admitted on 08/16/22 following L TKA revision and repair of patellar tendon injury. Recent L TKA on 9/11 and discharged to SNF then home where she hit knee on chair causing displaced patellar fracture. PMH: CVA.   OT comments  Limited treatment to edge of bed.  Patient unable to scoot to head of the bed, tried to work on offloading bottom from bed, and patient unable.  Tried leaning and weight shifting, but patient having difficulty with sequencing the movement.  Managed to perform light grooming and HEP described below prior to helicopter pivot back to bed.  Patient will need a prolonged rehab stay to regain the transfer skills needed to prevent further injury to herself.  The patient does not appear to have the necessary 24 hour near Max A to transition directly home.  Patient would need a hospital bed, wheelchair, bariatric bedside commode and appropriate hoyer lift to accommodate her left leg being held in extension.  Unsure if that is possible.  If patient is unable to participate with rehab, LTC may be needed until her WBS is upgraded.      Recommendations for follow up therapy are one component of a multi-disciplinary discharge planning process, led by the attending physician.  Recommendations may be updated based on patient status, additional functional criteria and insurance authorization.    Follow Up Recommendations  Skilled nursing-short term rehab (<3 hours/day)    Assistance Recommended at Discharge Frequent or constant Supervision/Assistance  Patient can return home with the following  Two people to help with walking and/or transfers;A lot of help with bathing/dressing/bathroom;Assist for transportation   Equipment Recommendations  Wheelchair cushion (measurements OT);Wheelchair (measurements OT)    Recommendations  for Other Services      Precautions / Restrictions Precautions Precautions: Fall;Knee Precaution Comments: Wound vac Required Braces or Orthoses: Knee Immobilizer - Left Knee Immobilizer - Left: Other (comment);On at all times Restrictions Weight Bearing Restrictions: Yes LLE Weight Bearing: Touchdown weight bearing       Mobility Bed Mobility Overal bed mobility: Needs Assistance Bed Mobility: Supine to Sit, Sit to Supine     Supine to sit: Max assist Sit to supine: Total assist        Transfers                   General transfer comment: unable to lift bottom off bed or scoot with arms and R leg.     Balance Overall balance assessment: Needs assistance Sitting-balance support: Feet supported, No upper extremity supported Sitting balance-Leahy Scale: Fair                                     ADL either performed or assessed with clinical judgement   ADL   Eating/Feeding: Set up;Bed level   Grooming: Wash/dry hands;Wash/dry face;Min guard;Sitting                                      Extremity/Trunk Assessment Upper Extremity Assessment Upper Extremity Assessment: Generalized weakness                              Cognition Arousal/Alertness: Awake/alert Behavior  During Therapy: Flat affect Overall Cognitive Status: History of cognitive impairments - at baseline                     Current Attention Level: Focused Memory: Decreased short-term memory Following Commands: Follows one step commands with increased time Safety/Judgement: Decreased awareness of safety, Decreased awareness of deficits   Problem Solving: Slow processing, Decreased initiation, Requires verbal cues, Difficulty sequencing, Requires tactile cues          Exercises General Exercises - Upper Extremity Shoulder Flexion: AROM, 5 reps, Both, Seated Elbow Flexion: AROM, 5 reps, Both, Seated Elbow Extension: AROM, 5 reps, Both,  Seated    Shoulder Instructions       General Comments      Pertinent Vitals/ Pain       Pain Assessment Pain Assessment: Faces Faces Pain Scale: Hurts little more Pain Location: L leg/kneewith movement Pain Descriptors / Indicators: Sore, Guarding Pain Intervention(s): Monitored during session                                                          Frequency  Min 2X/week        Progress Toward Goals  OT Goals(current goals can now be found in the care plan section)     Acute Rehab OT Goals OT Goal Formulation: Patient unable to participate in goal setting Time For Goal Achievement: 08/30/22 Potential to Achieve Goals: [place] Discharge plan remains appropriate    Co-evaluation                 AM-PAC OT "6 Clicks" Daily Activity     Outcome Measure   Help from another person eating meals?: None Help from another person taking care of personal grooming?: A Little Help from another person toileting, which includes using toliet, bedpan, or urinal?: Total Help from another person bathing (including washing, rinsing, drying)?: A Lot Help from another person to put on and taking off regular upper body clothing?: A Lot Help from another person to put on and taking off regular lower body clothing?: Total 6 Click Score: 13    End of Session    OT Visit Diagnosis: Muscle weakness (generalized) (M62.81);Pain;Other symptoms and signs involving cognitive function;History of falling (Z91.81) Pain - Right/Left: Left Pain - part of body: Knee;Leg   Activity Tolerance Patient limited by fatigue   Patient Left in bed;with call bell/phone within reach;with bed alarm set   Nurse Communication Mobility status        Time: 1660-6301 OT Time Calculation (min): 17 min  Charges: OT General Charges $OT Visit: 1 Visit OT Treatments $Therapeutic Activity: 8-22 mins  08/22/2022  RP, OTR/L  Acute Rehabilitation Services  Office:   [phone]   [name] 08/22/2022, 4:24 PM

## 2022-08-23 DIAGNOSIS — M9712XA Periprosthetic fracture around internal prosthetic left knee joint, initial encounter: Secondary | ICD-10-CM | POA: Diagnosis not present

## 2022-08-23 LAB — BASIC METABOLIC PANEL
Anion gap: 8 (ref 5–15)
BUN: 12 mg/dL (ref 8–23)
CO2: 29 mmol/L (ref 22–32)
Calcium: 9 mg/dL (ref 8.9–10.3)
Chloride: 102 mmol/L (ref 98–111)
Creatinine, Ser: 0.61 mg/dL (ref 0.44–1.00)
GFR, Estimated: >60 mL/min (ref >60)
Glucose, Bld: 100 mg/dL — ABNORMAL HIGH (ref 70–99)
Potassium: 4.3 mmol/L (ref 3.5–5.1)
Sodium: 139 mmol/L (ref 135–145)

## 2022-08-23 LAB — CBC WITH DIFFERENTIAL/PLATELET
Abs Immature Granulocytes: 0.1 10*3/uL — ABNORMAL HIGH (ref 0.00–0.07)
Basophils Absolute: 0.1 10*3/uL (ref 0.0–0.1)
Basophils Relative: 1 %
Eosinophils Absolute: 0.7 10*3/uL — ABNORMAL HIGH (ref 0.0–0.5)
Eosinophils Relative: 8 %
HCT: 30.8 % — ABNORMAL LOW (ref 36.0–46.0)
Hemoglobin: 10.1 g/dL — ABNORMAL LOW (ref 12.0–15.0)
Immature Granulocytes: 1 %
Lymphocytes Relative: 18 %
Lymphs Abs: 1.6 10*3/uL (ref 0.7–4.0)
MCH: 30.9 pg (ref 26.0–34.0)
MCHC: 32.8 g/dL (ref 30.0–36.0)
MCV: 94.2 fL (ref 80.0–100.0)
Monocytes Absolute: 1 10*3/uL (ref 0.1–1.0)
Monocytes Relative: 11 %
Neutro Abs: 5.6 10*3/uL (ref 1.7–7.7)
Neutrophils Relative %: 61 %
Platelets: 379 10*3/uL (ref 150–400)
RBC: 3.27 MIL/uL — ABNORMAL LOW (ref 3.87–5.11)
RDW: 14.4 % (ref 11.5–15.5)
WBC: 9.1 10*3/uL (ref 4.0–10.5)
nRBC: 0 % (ref 0.0–0.2)

## 2022-08-23 NOTE — Care Management Important Message (Signed)
Important Message  Patient Details  Name: [name] MRN: [id number] Date of Birth: 1946-09-23   Medicare Important Message Given:  Yes     Keighley Deckman 08/23/2022, 3:12 PM

## 2022-08-23 NOTE — TOC Transition Note (Signed)
Transition of Care [facility]) - CM/SW Discharge Note   Patient Details  Name: [name] MRN: [id number] Date of Birth: 19-Jun-1946  [facility] of [facility]) CM/SW Contact:  Joanne Chars, LCSW Phone Number: 08/23/2022, 1:33 PM   Clinical Narrative:   Pt discharging to [facility].  RN call [phone] for report.     Final next level of care: Skilled Nursing Facility Barriers to Discharge: Barriers Resolved   Patient Goals and CMS Choice     Choice offered to / list presented to : Spouse (husband [place])  Discharge Placement              Patient chooses bed at:  [facility]) Patient to be transferred to facility by: [place] Name of family member notified: son Larkin Ina Patient and family notified of of transfer: 08/23/22  Discharge Plan and Services In-house Referral: Clinical Social Work   Post Acute Care Choice: [place]                               Social Determinants of Health (SDOH) Interventions Food Insecurity Interventions: Intervention Not Indicated Housing Interventions: Intervention Not Indicated Transportation Interventions: Intervention Not Indicated Utilities Interventions: Intervention Not Indicated   Readmission Risk Interventions     No data to display

## 2022-08-23 NOTE — Plan of Care (Signed)

## 2022-08-23 NOTE — TOC Progression Note (Addendum)
Transition of Care [facility]) - Progression Note    Patient Details  Name: [name] MRN: [id number] Date of Birth: 03-Jul-1946  Transition of Care [facility]) CM/SW Contact  Joanne Chars, LCSW Phone Number: 08/23/2022, 11:36 AM  Clinical Narrative:   CSW spoke with pt husband Danny.  They are not going to appeal the SNF denial, but they have worked out an arrangement to self pay for readmission to [facility].  They have to finalize the finances, not sure this will happen today, but hopeful for tomorrow.  He will call back end of day with update.   CSW spoke with [facility],  pt can admit on Saturday as long as payment received today.  Saturday admission contact would be the nursing supervisor.   1300: TC Kerri, they have received payment good to go.  They are able to take pt today. TC son Larkin Ina.  Discussed DC for today and he is agreeable to this, he will communicate with his father. CSW verified with [employer] that [facility] transportation has been approved: [id number].  Confirmed with [place].  MD notified.      Expected Discharge Plan: [place] Barriers to Discharge: Continued Medical Work up  Expected Discharge Plan and Services Expected Discharge Plan: [place] In-house Referral: Clinical Social Work   Post Acute Care Choice: [place] Living arrangements for the past 2 months: Single Family Home Expected Discharge Date: 08/21/22                                     Social Determinants of Health (SDOH) Interventions Food Insecurity Interventions: Intervention Not Indicated Housing Interventions: Intervention Not Indicated Transportation Interventions: Intervention Not Indicated Utilities Interventions: Intervention Not Indicated  Readmission Risk Interventions     No data to display

## 2022-08-23 NOTE — Progress Notes (Signed)
Physical Therapy Treatment Patient Details Name: [name] MRN: [id number] DOB: 23-Jan-1946 Today's Date: 08/23/2022   History of Present Illness 76 y/o female admitted on 08/16/22 following L TKA revision and repair of patellar tendon injury. Recent L TKA on 9/11 and discharged to SNF then home where she hit knee on chair causing displaced patellar fracture. PMH: CVA.    PT Comments    Patient progressing slowly towards PT goals. Cognition seems improved today as pt oriented x4 and able to engage appropriately with this therapist. Requires less assist for bed mobility today still needing Mod A. Pt with difficulty performing lateral scoot along side bed to prepare for tx to chair needing Max A of 2 with step by step cues for sequencing. Tolerated there ex in supine and seated EOB. Continues to require SNF at d/c. Will follow.    Recommendations for follow up therapy are one component of a multi-disciplinary discharge planning process, led by the attending physician.  Recommendations may be updated based on patient status, additional functional criteria and insurance authorization.  Follow Up Recommendations  Skilled nursing-short term rehab (<3 hours/day) Can patient physically be transported by private vehicle: No   Assistance Recommended at Discharge Frequent or constant Supervision/Assistance  Patient can return home with the following Two people to help with walking and/or transfers;Two people to help with bathing/dressing/bathroom;Assistance with cooking/housework;Direct supervision/assist for medications management;Direct supervision/assist for financial management;Assist for transportation;Help with stairs or ramp for entrance   Equipment Recommendations  None recommended by PT    Recommendations for Other Services       Precautions / Restrictions Precautions Precautions: Fall;Knee Precaution Booklet Issued: No Precaution Comments: Wound vac Restrictions Weight Bearing  Restrictions: Yes LLE Weight Bearing: Touchdown weight bearing     Mobility  Bed Mobility Overal bed mobility: Needs Assistance Bed Mobility: Supine to Sit     Supine to sit: Mod assist, HOB elevated Sit to supine: Max assist, +2 for physical assistance   General bed mobility comments: Able to bring LLE to EOB today with increased time, assist with scooting bottom and trunk. Assist to bring LEs back into bed.    Transfers Overall transfer level: Needs assistance Equipment used: None              Lateral/Scoot Transfers: Max assist, +2 physical assistance General transfer comment: Max A of 2 to laterally scoot along side bed with use of pad, difficulty sequencing movement but able to initiate tiny scoots until fatigued. Step by step cues needed.    Ambulation/Gait                   Stairs             Wheelchair Mobility    Modified Rankin (Stroke Patients Only)       Balance Overall balance assessment: Needs assistance Sitting-balance support: Feet supported, No upper extremity supported Sitting balance-Leahy Scale: Fair Sitting balance - Comments: Supervision for safety.                                    Cognition Arousal/Alertness: Awake/alert Behavior During Therapy: Flat affect Overall Cognitive Status: History of cognitive impairments - at baseline                                 General Comments: More alert and oriented today to  place and month/year/situation. Delayed processing and also HOH so needs repetition.        Exercises General Exercises - Lower Extremity Ankle Circles/Pumps: AAROM, Both, 10 reps, Supine Long Arc Quad: AROM, Right, 10 reps, Seated Heel Slides: AROM, Right, 10 reps, Supine Hip ABduction/ADduction: 5 reps, Supine, Both, AROM Hip Flexion/Marching: AROM, Right, 10 reps, Seated    General Comments General comments (skin integrity, edema, etc.): HR up to 115 bpm with activity.       Pertinent Vitals/Pain Pain Assessment Pain Assessment: Faces Faces Pain Scale: Hurts little more Pain Location: L leg/knee with movement Pain Descriptors / Indicators: Sore, Guarding Pain Intervention(s): Monitored during session, Repositioned, Limited activity within patient's tolerance    Home Living                          Prior Function            PT Goals (current goals can now be found in the care plan section) Progress towards PT goals: Progressing toward goals    Frequency    Min 2X/week      PT Plan Current plan remains appropriate    Co-evaluation              AM-PAC PT "6 Clicks" Mobility   Outcome Measure  Help needed turning from your back to your side while in a flat bed without using bedrails?: A Lot Help needed moving from lying on your back to sitting on the side of a flat bed without using bedrails?: A Lot Help needed moving to and from a bed to a chair (including a wheelchair)?: Total Help needed standing up from a chair using your arms (e.g., wheelchair or bedside chair)?: Total Help needed to walk in hospital room?: Total Help needed climbing 3-5 steps with a railing? : Total 6 Click Score: 8    End of Session Equipment Utilized During Treatment: Gait belt Activity Tolerance: Patient tolerated treatment well Patient left: in bed;with call bell/phone within reach;with bed alarm set Nurse Communication: Mobility status PT Visit Diagnosis: Other abnormalities of gait and mobility (R26.89);Muscle weakness (generalized) (M62.81)     Time: 8841-6606 PT Time Calculation (min) (ACUTE ONLY): 24 min  Charges:  $Therapeutic Exercise: 8-22 mins $Therapeutic Activity: 8-22 mins                     Marisa Severin, PT, DPT Acute Rehabilitation Services Secure chat preferred Office [place] 08/23/2022, 1:06 PM

## 2022-08-23 NOTE — Discharge Summary (Signed)
Physician Discharge Summary  SEDA KRONBERG WER:[id number] DOB: [birth year] DOA: 08/14/2022  PCP: Asencion Noble, MD  Admit date: 08/14/2022 Discharge date: 08/23/2022  Admitted From: SNF Disposition:  SNF  Discharge Condition:Stable CODE STATUS:FULL Diet recommendation:  Regular   Brief/Interim Summary: Patient is a 76 female with history of chronic pain syndrome, stroke, hyperlipidemia, stress incontinence, asthma, GERD, A-fib, DVT, depression, anxiety who presented from orthopedics office for management of left knee fracture.  She had a left total knee replacement on September 11 and was recovering at rehab facility but had a fall while at rehab facility and fractured her patella and her knee .  She was recently diagnosed with COVID on September 25 and was weak and lightheaded and fell.  No evidence of pneumonia as per chest x-ray that was done as an outpatient, she completed outpatient antiviral medication.  Orthopedics were following, status post revision of total left knee arthroplasty, tendon repair with graft on 10/6.  Hospital course remarkable for new onset A-fib, cardiology was also consulted.  Wound culture did not show any growth, she completed 7 days course of ceftriaxone.  Heart rate is well controlled.  She will follow-up with orthopedics and cardiology as an outpatient.  Medically stable for discharge whenever possible.  Following problems were addressed during her hospitalization:  Prosthetic fracture of left knee joint: Fell at [facility].  Status post left total knee replacement on September 11.  Orthopedics following. status post revision of total left knee arthroplasty, tendon repair with graft on 10/6. PT/OT recommending SNF on discharge. Postoperatively, she was requiring oxygen for maintenance of saturation.  Currently on room air.  Chest x-ray did not show any significant findings.  Plan for discharge to SNF today.   Suspected cellulitis/leukocytosis: Left lower extremity also  appeared erythematous, warm on admission.  No fever.  Started on ceftriaxone with significant  improvement.  Developed  leukocytosis now improving .  Aerobic/anaerobic culture sent on 10/6 is pending without any growth.  Plan for antibiotics for 7 days course,day 7/7   Paroxysmal A-fib: As per the husband, she was diagnosed with paroxysmal A-fib at rehab but not discharged on any medications.  Not on rate control meds.  Went into A-fib with RVR after surgery, treated with Cardizem drip.  Started  on low-dose metoprolol.  Also started on Eliquis,low dose.CHA2DS2VASc of 5.  Cardiology consulted.  Echo showed EF of 55 to 60%, indeterminate left ventricular diastolic parameters.  She will follow-up with cardiology at [facility].   Hypotension/AKI: Postoperatively she went into A-fib, became hypotensive and creatinine also trended up.  Currently normotensive.  AKI resolved   COVID infection not currently treated and not treated during this hospitalization: Tested  positive on September 25.  Completed course of antiviral.  On room air.  Lifted precautions   History of CVA/hyperlipidemia: Not on anything at home   Stress incontinence: Continue home Myrbetriq   Neurocognitive deficits: Continue supportive care , delirium precautions.She is mostly oriented   Depression/anxiety/insomnia: Continue home BuSpar, duloxetine   Bilateral lower extremity edema:  BNP elevated.  Echo showed EF of 55 to 60%, indeterminate diastolic parameters.Significantly improved with lasix.  Now euvolemic.  Right lower extremity weakness: I was informed by orthopedics day before yesterday that patient was not able to move her right lower extremity and thus stroke was suspected and thus CT head was obtained which showed some suspicion of possible acute versus chronic stroke however on my examination yesterday and today was able to lift her right lower  extremity without any trouble but due to concern of stroke, MRI was obtained  which essentially ruled out stroke.  Right lower extremity weakness has been ruled out.  Disposition: Patient was in fact being discharged on 08/21/2022 we were waiting for insurance authorization.  Insurance declined on 08/22/2022.  Family has now made arrangements to self-pay and patient is going to be discharged to skilled nursing facility in stable condition.  Discharge Diagnoses:  Principal Problem:   Periprosthetic fracture around internal prosthetic left knee joint Active Problems:   Neck pain, chronic   History of CVA (cerebrovascular accident)   Hyperlipidemia   Asthma   Esophageal reflux   Paroxysmal atrial fibrillation (HCC)   History of DVT of lower extremity   SARS-CoV-2 positive   Neurocognitive deficits    Discharge Instructions  Discharge Instructions     Diet general   Complete by: As directed    Discharge instructions   Complete by: As directed    1)Please take prescribed medications as instructed 2)Follow up with orthopedics and cardiology as an outpatient.  Name and number the providers have been attached   Increase activity slowly   Complete by: As directed    No wound care   Complete by: As directed       Allergies as of 08/23/2022       Reactions   Ticlid [ticlopidine] Shortness Of Breath   Nsaids Other (See Comments)   Bleeding ulcer   Prednisone Other (See Comments)   Makes her stomach hurt--knows this isn't an allergy but doesn't want to take it   Monistat [miconazole] Swelling, Rash   Sulfa Antibiotics Rash        Medication List     TAKE these medications    acetaminophen 500 MG tablet Commonly known as: TYLENOL Take 1,000 mg by mouth every 6 (six) hours as needed for headache, moderate pain or mild pain.   apixaban 5 MG Tabs tablet Commonly known as: ELIQUIS Take 1 tablet (5 mg total) by mouth 2 (two) times daily.   busPIRone 30 MG tablet Commonly known as: BUSPAR Take 1 tablet (30 mg total) by mouth 2 (two) times daily.    DULoxetine 60 MG capsule Commonly known as: CYMBALTA Take 1 capsule (60 mg total) by mouth 2 (two) times daily. What changed:  how much to take when to take this   eszopiclone 2 MG Tabs tablet Commonly known as: LUNESTA Take immediately before bedtime   HYDROcodone-acetaminophen 5-325 MG tablet Commonly known as: NORCO/VICODIN Take 1 tablet by mouth every 4 (four) hours as needed for moderate pain (pain score 4-6).   LORazepam 0.5 MG tablet Commonly known as: Ativan Take 1 tablet (0.5 mg total) by mouth 2 (two) times daily as needed for anxiety.   metoprolol tartrate 25 MG tablet Commonly known as: LOPRESSOR Take 1 tablet (25 mg total) by mouth 2 (two) times daily.   Myrbetriq 50 MG Tb24 tablet Generic drug: mirabegron ER Take 50 mg by mouth daily.   polyethylene glycol 17 g packet Commonly known as: MIRALAX / GLYCOLAX Take 17 g by mouth daily as needed.   senna 8.6 MG Tabs tablet Commonly known as: SENOKOT Take 1 tablet (8.6 mg total) by mouth 2 (two) times daily.   SUMAtriptan 100 MG tablet Commonly known as: IMITREX Take 100 mg by mouth every 2 (two) hours as needed for migraine or headache. May repeat in 2 hours if headache persists or recurs.   Vitamin D 125 MCG (5000 UT)  Caps Take 5,000 Units by mouth daily.        Follow-up Information     [name], MD Follow up on 08/27/2022.   Specialty: Orthopedic Surgery Contact information: [place] [address] [phone]         Arnoldo Lenis, MD Follow up on 09/03/2022.   Specialty: Cardiology Why: at 1:00pm with Dr. Dellia Cloud cardiology Contact information: [place] [address] [phone]                Allergies  Allergen Reactions   Ticlid [Ticlopidine] Shortness Of Breath   Nsaids Other (See Comments)    Bleeding ulcer   Prednisone Other (See Comments)    Makes her stomach hurt--knows this isn't an allergy but doesn't  want to take it    Monistat [Miconazole] Swelling and Rash   Sulfa Antibiotics Rash    Consultations: Cardiology, orthopedics   Procedures/Studies: MR BRAIN WO CONTRAST  Result Date: 08/22/2022 CLINICAL DATA:  Acute neurologic deficit EXAM: MRI HEAD WITHOUT CONTRAST TECHNIQUE: Multiplanar, multiecho pulse sequences of the brain and surrounding structures were obtained without intravenous contrast. COMPARISON:  06/09/2017 FINDINGS: Brain: No acute infarct, mass effect or extra-axial collection. No acute or chronic hemorrhage. There is confluent hyperintense T2-weighted signal within the white matter. Generalized volume loss. The midline structures are normal. Vascular: Major flow voids are preserved. Skull and upper cervical spine: Normal calvarium and skull base. Visualized upper cervical spine and soft tissues are normal. Sinuses/Orbits:No paranasal sinus fluid levels or advanced mucosal thickening. No mastoid or middle ear effusion. Normal orbits. IMPRESSION: 1. No acute intracranial abnormality. 2. Findings of chronic small vessel ischemia and volume loss. Electronically Signed   By: Ulyses Jarred M.D.   On: 08/22/2022 19:42   CT HEAD WO CONTRAST (5MM)  Result Date: 08/21/2022 CLINICAL DATA:  Neuro deficit, acute, stroke suspected EXAM: CT HEAD WITHOUT CONTRAST TECHNIQUE: Contiguous axial images were obtained from the base of the skull through the vertex without intravenous contrast. RADIATION DOSE REDUCTION: This exam was performed according to the departmental dose-optimization program which includes automated exposure control, adjustment of the mA and/or kV according to patient size and/or use of iterative reconstruction technique. COMPARISON:  Brain MRI 06/09/2017 FINDINGS: Brain: Possible sulcal effacement and loss of gray-white differentiation in the posterior right frontal lobe series 3, image 20, concerning for subacute infarct. Mild periventricular and deep white matter hypodensity  typical of chronic small vessel ischemia. No intracranial hemorrhage, mass effect, or midline shift. No hydrocephalus. The basilar cisterns are patent. No extra-axial or intracranial fluid collection. Vascular: No hyperdense vessel or unexpected calcification. Skull: No fracture or focal lesion. Sinuses/Orbits: Paranasal sinuses and mastoid air cells are clear. The visualized orbits are unremarkable. Other: None. IMPRESSION: 1. Possible sulcal effacement and loss of gray-white differentiation in the posterior right frontal lobe may represent subacute infarct versus artifact. Consider further evaluation with MRI. 2. Mild chronic small vessel ischemia. These results will be called to the ordering clinician or representative by the Radiologist Assistant, and communication documented in the PACS or [employer]. Electronically Signed   By: Keith Rake M.D.   On: 08/21/2022 17:56   DG CHEST PORT 1 VIEW  Result Date: 08/16/2022 CLINICAL DATA:  Short of breath EXAM: PORTABLE CHEST 1 VIEW COMPARISON:  Three hundred eighteen FINDINGS: Single frontal view of the chest demonstrates an unremarkable cardiac silhouette. Chronic elevation of the left hemidiaphragm. No acute airspace disease, effusion, or pneumothorax.  No acute bony abnormality. IMPRESSION: 1. No acute intrathoracic process. Electronically Signed   By: Randa Ngo M.D.   On: 08/16/2022 20:39   ECHOCARDIOGRAM COMPLETE  Result Date: 08/16/2022    ECHOCARDIOGRAM REPORT   Patient Name:   [name] Date of Exam: 08/15/2022 Medical Rec #:  [id number]     Height:       65.0 in Accession #:    [id number]    Weight:       206.9 lb Date of Birth:  Feb 07, 1946    BSA:          2.007 m Patient Age:    45 years      BP:           121/82 mmHg Patient Gender: F             HR:           115 bpm. Exam Location:  Inpatient Procedure: 2D Echo, Color Doppler and Cardiac Doppler Indications:    CHF  History:        Patient has no prior history of Echocardiogram  examinations.                 Stroke; Signs/Symptoms:Murmur.  Sonographer:    Memory Argue Referring Phys: [id number] AMRIT ADHIKARI  Sonographer Comments: Technically difficult study due to poor echo windows. IMPRESSIONS  1. Poor quality exam. Consider repeat exam when HR is better controlled.  2. Left ventricular ejection fraction, by estimation, is 55 to 60%. The left ventricle has normal function. Left ventricular endocardial border not optimally defined to evaluate regional wall motion. Left ventricular diastolic parameters are indeterminate. Definity contrast not used.  3. Right ventricular systolic function is normal. The right ventricular size is normal. There is normal pulmonary artery systolic pressure.  4. Left atrial size was moderately dilated.  5. The mitral valve is grossly normal. Trivial mitral valve regurgitation. No evidence of mitral stenosis.  6. The aortic valve was not well visualized. Aortic valve regurgitation is not visualized. Aortic valve sclerosis is present, with no evidence of aortic valve stenosis.  7. The inferior vena cava is normal in size with <50% respiratory variability, suggesting right atrial pressure of 8 mmHg. FINDINGS  Left Ventricle: Left ventricular ejection fraction, by estimation, is 55 to 60%. The left ventricle has normal function. Left ventricular endocardial border not optimally defined to evaluate regional wall motion. The left ventricular internal cavity size was normal in size. There is no left ventricular hypertrophy. Left ventricular diastolic parameters are indeterminate. Right Ventricle: The right ventricular size is normal. No increase in right ventricular wall thickness. Right ventricular systolic function is normal. There is normal pulmonary artery systolic pressure. The tricuspid regurgitant velocity is 2.19 m/s, and  with an assumed right atrial pressure of 8 mmHg, the estimated right ventricular systolic pressure is 17.4 mmHg. Left Atrium: Left atrial  size was moderately dilated. Right Atrium: Right atrial size was normal in size. Pericardium: There is no evidence of pericardial effusion. Mitral Valve: The mitral valve is grossly normal. Trivial mitral valve regurgitation. No evidence of mitral valve stenosis. Tricuspid Valve: The tricuspid valve is not well visualized. Tricuspid valve regurgitation is trivial. No evidence of tricuspid stenosis. Aortic Valve: The aortic valve was not well visualized. Aortic valve regurgitation is not visualized. Aortic valve sclerosis is present, with no evidence of aortic valve stenosis. Aortic valve mean gradient measures 7.0 mmHg. Aortic valve peak gradient measures 13.1 mmHg. Aortic valve area, by  VTI measures 2.00 cm. Pulmonic Valve: The pulmonic valve was not assessed. Pulmonic valve regurgitation is not visualized. No evidence of pulmonic stenosis. Aorta: The ascending aorta was not well visualized. Venous: The inferior vena cava is normal in size with less than 50% respiratory variability, suggesting right atrial pressure of 8 mmHg. IAS/Shunts: No atrial level shunt detected by color flow Doppler.  LEFT VENTRICLE PLAX 2D LVIDd:         4.00 cm LVIDs:         2.60 cm LV PW:         1.00 cm LV IVS:        1.00 cm LVOT diam:     2.00 cm LV SV:         55 LV SV Index:   28 LVOT Area:     3.14 cm  RIGHT VENTRICLE TAPSE (M-mode): 2.0 cm LEFT ATRIUM             Index        RIGHT ATRIUM           Index LA diam:        3.20 cm 1.59 cm/m   RA Area:     15.10 cm LA Vol (A2C):   80.8 ml 40.24 ml/m  RA Volume:   38.50 ml  19.18 ml/m LA Vol (A4C):   95.9 ml 47.79 ml/m LA Biplane Vol: 95.6 ml 47.64 ml/m  AORTIC VALVE AV Area (Vmax):    1.89 cm AV Area (Vmean):   1.92 cm AV Area (VTI):     2.00 cm AV Vmax:           181.00 cm/s AV Vmean:          124.000 cm/s AV VTI:            0.277 m AV Peak Grad:      13.1 mmHg AV Mean Grad:      7.0 mmHg LVOT Vmax:         109.00 cm/s LVOT Vmean:        75.800 cm/s LVOT VTI:           0.176 m LVOT/AV VTI ratio: 0.64  AORTA Ao Root diam: 3.60 cm MR Peak grad: 37.5 mmHg   TRICUSPID VALVE MR Vmax:      306.00 cm/s TR Peak grad:   19.2 mmHg                           TR Vmax:        219.00 cm/s                            SHUNTS                           Systemic VTI:  0.18 m                           Systemic Diam: 2.00 cm Cherlynn Kaiser MD Electronically signed by Cherlynn Kaiser MD Signature Date/Time: 08/16/2022/6:03:32 AM    Final       Subjective:  Patient seen and examined.  She has no complaints.  Discharge Exam: Vitals:   08/23/22 0435 08/23/22 0728  BP: 124/62   Pulse: 98   Resp:    Temp: 98.2 F (36.8 C) 98.3 F (36.8 C)  SpO2:  96%   Vitals:   08/22/22 1945 08/23/22 0013 08/23/22 0435 08/23/22 0728  BP: 116/80 121/88 124/62   Pulse: (!) 106 88 98   Resp: 18 18    Temp: 97.8 F (36.6 C) 98.5 F (36.9 C) 98.2 F (36.8 C) 98.3 F (36.8 C)  TempSrc: Oral Oral Oral Oral  SpO2: 95%   96%  Weight:      Height:       General exam: Appears calm and comfortable  Respiratory system: Clear to auscultation. Respiratory effort normal. Cardiovascular system: S1 & S2 heard, RRR. No JVD, murmurs, rubs, gallops or clicks. No pedal edema. Gastrointestinal system: Abdomen is nondistended, soft and nontender. No organomegaly or masses felt. Normal bowel sounds heard. Central nervous system: Alert and oriented. No focal neurological deficits. Skin: No rashes, lesions or ulcers.  Psychiatry: Judgement and insight appear normal. Mood & affect appropriate.    The results of significant diagnostics from this hospitalization (including imaging, microbiology, ancillary and laboratory) are listed below for reference.     Microbiology: Recent Results (from the past 240 hour(s))  Surgical pcr screen     Status: None   Collection Time: 08/16/22  7:00 AM   Specimen: Nasal Mucosa; Nasal Swab  Result Value Ref Range Status   MRSA, PCR NEGATIVE NEGATIVE Final    Staphylococcus aureus NEGATIVE NEGATIVE Final    Comment: (NOTE) The Xpert SA Assay (FDA approved for NASAL specimens in patients 72 years of age and older), is one component of a comprehensive surveillance program. It is not intended to diagnose infection nor to guide or monitor treatment. Performed at [facility], [place] [address], [place], [place] [address]   Surgical pcr screen     Status: None   Collection Time: 08/16/22 11:19 AM   Specimen: Nasal Mucosa; Nasal Swab  Result Value Ref Range Status   MRSA, PCR NEGATIVE NEGATIVE Final   Staphylococcus aureus NEGATIVE NEGATIVE Final    Comment: (NOTE) The Xpert SA Assay (FDA approved for NASAL specimens in patients 22 years of age and older), is one component of a comprehensive surveillance program. It is not intended to diagnose infection nor to guide or monitor treatment. Performed at [facility], [place] [address], [place], [place] [address]   Aerobic/Anaerobic Culture w Gram Stain (surgical/deep wound)     Status: None   Collection Time: 08/16/22  2:01 PM   Specimen: Other Source; Tissue  Result Value Ref Range Status   Specimen Description KNEE LEFT  Final   Special Requests SAMPLE NO 1  Final   Gram Stain NO WBC SEEN NO ORGANISMS SEEN   Final   Culture   Final    No growth aerobically or anaerobically. Performed at [facility], [place] [address], [place], [place] [address]    Report Status 08/21/2022 FINAL  Final  Aerobic/Anaerobic Culture w Gram Stain (surgical/deep wound)     Status: None   Collection Time: 08/16/22  2:02 PM   Specimen: Other Source; Tissue  Result Value Ref Range Status   Specimen Description TISSUE LEFT KNEE  Final   Special Requests SAMPLE NO 2  Final   Gram Stain   Final    WBC PRESENT, PREDOMINANTLY MONONUCLEAR NO ORGANISMS SEEN    Culture   Final    No growth aerobically or anaerobically. Performed at [facility], [place] [address], [place], [place] [address]     Report Status 08/21/2022 FINAL  Final  Aerobic/Anaerobic Culture w Gram Stain (surgical/deep wound)     Status: None   Collection Time: 08/16/22  2:02 PM   Specimen: Other Source; Tissue  Result Value Ref Range Status   Specimen Description TISSUE LEFT KNEE  Final   Special Requests SAMPLE NO 3  Final   Gram Stain   Final    WBC PRESENT, PREDOMINANTLY MONONUCLEAR NO ORGANISMS SEEN    Culture   Final    No growth aerobically or anaerobically. Performed at [facility], [place] [address], [place], [place] [address]    Report Status 08/21/2022 FINAL  Final     Labs: BNP (last 3 results) Recent Labs    08/15/22 1207  BNP 951.8*   Basic Metabolic Panel: Recent Labs  Lab 08/17/22 0221 08/18/22 0316 08/19/22 0305 08/23/22 0452  NA 135 135 136 139  K 3.8 3.9 3.9 4.3  CL 93* 95* 99 102  CO2 '28 28 28 29  '$ GLUCOSE 132* 121* 107* 100*  BUN 13 24* 22 12  CREATININE 1.32* 1.33* 0.70 0.61  CALCIUM 8.8* 8.8* 8.9 9.0   Liver Function Tests: No results for input(s): "AST", "ALT", "ALKPHOS", "BILITOT", "PROT", "ALBUMIN" in the last 168 hours. No results for input(s): "LIPASE", "AMYLASE" in the last 168 hours. No results for input(s): "AMMONIA" in the last 168 hours. CBC: Recent Labs  Lab 08/17/22 0221 08/18/22 0316 08/19/22 0305 08/20/22 0120 08/23/22 0452  WBC 16.9* 15.6* 14.1* 10.9* 9.1  NEUTROABS  --   --   --   --  5.6  HGB 10.8* 9.8* 10.0* 9.5* 10.1*  HCT 34.2* 31.0* 31.9* 30.6* 30.8*  MCV 96.1 96.6 97.9 97.1 94.2  PLT 387 331 312 337 379   Cardiac Enzymes: No results for input(s): "CKTOTAL", "CKMB", "CKMBINDEX", "TROPONINI" in the last 168 hours. BNP: Invalid input(s): "POCBNP" CBG: No results for input(s): "GLUCAP" in the last 168 hours. D-Dimer No results for input(s): "DDIMER" in the last 72 hours. Hgb A1c No results for input(s): "HGBA1C" in the last 72 hours. Lipid Profile No results for input(s): "CHOL", "HDL", "LDLCALC", "TRIG", "CHOLHDL",  "LDLDIRECT" in the last 72 hours. Thyroid function studies No results for input(s): "TSH", "T4TOTAL", "T3FREE", "THYROIDAB" in the last 72 hours.  Invalid input(s): "FREET3" Anemia work up No results for input(s): "VITAMINB12", "FOLATE", "FERRITIN", "TIBC", "IRON", "RETICCTPCT" in the last 72 hours. Urinalysis No results found for: "COLORURINE", "APPEARANCEUR", "LABSPEC", "PHURINE", "GLUCOSEU", "HGBUR", "BILIRUBINUR", "KETONESUR", "PROTEINUR", "UROBILINOGEN", "NITRITE", "LEUKOCYTESUR" Sepsis Labs Recent Labs  Lab 08/18/22 0316 08/19/22 0305 08/20/22 0120 08/23/22 0452  WBC 15.6* 14.1* 10.9* 9.1   Microbiology Recent Results (from the past 240 hour(s))  Surgical pcr screen     Status: None   Collection Time: 08/16/22  7:00 AM   Specimen: Nasal Mucosa; Nasal Swab  Result Value Ref Range Status   MRSA, PCR NEGATIVE NEGATIVE Final   Staphylococcus aureus NEGATIVE NEGATIVE Final    Comment: (NOTE) The Xpert SA Assay (FDA approved for NASAL specimens in patients 56 years of age and older), is one component of a comprehensive surveillance program. It is not intended to diagnose infection nor to guide or monitor treatment. Performed at [facility], [place] [address] [place], [place] [address]   Surgical pcr screen     Status: None   Collection Time: 08/16/22 11:19 AM   Specimen: Nasal Mucosa; Nasal Swab  Result Value Ref Range Status   MRSA, PCR NEGATIVE NEGATIVE Final   Staphylococcus aureus NEGATIVE NEGATIVE Final  Comment: (NOTE) The Xpert SA Assay (FDA approved for NASAL specimens in patients 43 years of age and older), is one component of a comprehensive surveillance program. It is not intended to diagnose infection nor to guide or monitor treatment. Performed at [facility], [place] [address], [place], [place] [address]   Aerobic/Anaerobic Culture w Gram Stain (surgical/deep wound)     Status: None   Collection Time: 08/16/22  2:01 PM   Specimen: Other  Source; Tissue  Result Value Ref Range Status   Specimen Description KNEE LEFT  Final   Special Requests SAMPLE NO 1  Final   Gram Stain NO WBC SEEN NO ORGANISMS SEEN   Final   Culture   Final    No growth aerobically or anaerobically. Performed at [facility], [place] [address], [place], [place] [address]    Report Status 08/21/2022 FINAL  Final  Aerobic/Anaerobic Culture w Gram Stain (surgical/deep wound)     Status: None   Collection Time: 08/16/22  2:02 PM   Specimen: Other Source; Tissue  Result Value Ref Range Status   Specimen Description TISSUE LEFT KNEE  Final   Special Requests SAMPLE NO 2  Final   Gram Stain   Final    WBC PRESENT, PREDOMINANTLY MONONUCLEAR NO ORGANISMS SEEN    Culture   Final    No growth aerobically or anaerobically. Performed at [facility], [place] [address], [place], [place] [address]    Report Status 08/21/2022 FINAL  Final  Aerobic/Anaerobic Culture w Gram Stain (surgical/deep wound)     Status: None   Collection Time: 08/16/22  2:02 PM   Specimen: Other Source; Tissue  Result Value Ref Range Status   Specimen Description TISSUE LEFT KNEE  Final   Special Requests SAMPLE NO 3  Final   Gram Stain   Final    WBC PRESENT, PREDOMINANTLY MONONUCLEAR NO ORGANISMS SEEN    Culture   Final    No growth aerobically or anaerobically. Performed at [facility], [place] [address] [place], [place] [address]    Report Status 08/21/2022 FINAL  Final    Please note: You were cared for by a hospitalist during your hospital stay. Once you are discharged, your primary care physician will handle any further medical issues. Please note that NO REFILLS for any discharge medications will be authorized once you are discharged, as it is imperative that you return to your primary care physician (or establish a relationship with a primary care physician if you do not have one) for your post hospital discharge needs so that they can reassess your need  for medications and monitor your lab values.    Time coordinating discharge: 40 minutes  SIGNED:   Darliss Cheney, MD  [facility] 08/23/2022, 1:16 PM  If 7PM-7AM, please contact night-coverage [email or web address] Password [email or web address]

## 2022-08-26 ENCOUNTER — Encounter: Payer: Self-pay | Admitting: Adult Health

## 2022-08-26 ENCOUNTER — Encounter (HOSPITAL_COMMUNITY): Payer: PPO | Admitting: Physical Therapy

## 2022-08-26 ENCOUNTER — Non-Acute Institutional Stay (SKILLED_NURSING_FACILITY): Payer: PPO | Admitting: Adult Health

## 2022-08-26 DIAGNOSIS — F419 Anxiety disorder, unspecified: Secondary | ICD-10-CM

## 2022-08-26 DIAGNOSIS — D649 Anemia, unspecified: Secondary | ICD-10-CM | POA: Diagnosis not present

## 2022-08-26 DIAGNOSIS — G894 Chronic pain syndrome: Secondary | ICD-10-CM

## 2022-08-26 DIAGNOSIS — I480 Paroxysmal atrial fibrillation: Secondary | ICD-10-CM

## 2022-08-26 DIAGNOSIS — M1712 Unilateral primary osteoarthritis, left knee: Secondary | ICD-10-CM

## 2022-08-26 DIAGNOSIS — N393 Stress incontinence (female) (male): Secondary | ICD-10-CM

## 2022-08-26 DIAGNOSIS — F0150 Vascular dementia without behavioral disturbance: Secondary | ICD-10-CM

## 2022-08-26 DIAGNOSIS — K5909 Other constipation: Secondary | ICD-10-CM | POA: Diagnosis not present

## 2022-08-26 DIAGNOSIS — G43909 Migraine, unspecified, not intractable, without status migrainosus: Secondary | ICD-10-CM

## 2022-08-26 DIAGNOSIS — M9712XS Periprosthetic fracture around internal prosthetic left knee joint, sequela: Secondary | ICD-10-CM | POA: Diagnosis not present

## 2022-08-26 DIAGNOSIS — F5104 Psychophysiologic insomnia: Secondary | ICD-10-CM

## 2022-08-26 DIAGNOSIS — Z8673 Personal history of transient ischemic attack (TIA), and cerebral infarction without residual deficits: Secondary | ICD-10-CM

## 2022-08-26 DIAGNOSIS — J45909 Unspecified asthma, uncomplicated: Secondary | ICD-10-CM

## 2022-08-26 NOTE — Progress Notes (Signed)
Location:  [facility] Room Number: 160 Place of Service:  SNF (31)   CODE STATUS: full   Allergies  Allergen Reactions   Ticlid [Ticlopidine] Shortness Of Breath   Nsaids Other (See Comments)    Bleeding ulcer   Prednisone Other (See Comments)    Makes her stomach hurt--knows this isn't an allergy but doesn't want to take it    Monistat [Miconazole] Swelling and Rash   Sulfa Antibiotics Rash    Chief Complaint  Patient presents with   Hospitalization Follow-up    HPI:   She is a 76 year old woman who has been hospitalized from 08-14-22 through 08-23-22. Her medical history includes: chronic pain syndrome; cva; hyperlipidemia; GERD afib; dvt. She had a left total knee replacement on 07-22-22. She told the orthopedist that she had a fall in this facility; however; there are no records of this fall and she is unable to get herself up. She suffered a fracture on her patella and knee. She had recently had covid on 08-05-22. She had a left arthroplasty tendon repair with graft on 08-16-22. She developed new onset of afib. She did have a cardiology consult was started on eliquis. Her wound cultures were negative did complete a 7 day coarse of ceftriaxone. She is here for short term rehab with her goal to return back home. She is not having any pain at this time. She will continue to be followed for her chronic illnesses including: History of cva:  Paroxsymal atrial fibrillation:  Migraine without status migrainosus non intractable unspecified migraine type: .SUI (stress urinary incontinence)   Past Medical History:  Diagnosis Date   Anemia    Arthritis    Asthma    Atrial fibrillation ([place])    COVID-19 08/05/2022   Depression    H/O head injury    Hearing loss    Heart murmur    can be heard at times, Doctor said not to worry   History of kidney stones    History of stomach ulcers    Migraine    Pneumonia    Psittacosis    Stroke [facility])     Past Surgical  History:  Procedure Laterality Date   ANKLE FRACTURE SURGERY Left    APPENDECTOMY     BREAST LUMPECTOMY     CESAREAN SECTION     x 3   I & D KNEE WITH POLY EXCHANGE Left 08/16/2022   Procedure: KNEE POLY EXCHANGE;  Surgeon: Hiram Gash, MD;  Location: [place];  Service: Orthopedics;  Laterality: Left;   KNEE ARTHROSCOPY WITH PATELLAR TENDON REPAIR Left 08/16/2022   Procedure: IRRIGATION AND DEBRIDEMENT LEFT KNEE REVISION WITH PATELLAR TENDON REPAIR and poly exchange;  Surgeon: Hiram Gash, MD;  Location: [facility];  Service: Orthopedics;  Laterality: Left;   PARTIAL HYSTERECTOMY     ROTATOR CUFF REPAIR Right    TOTAL KNEE ARTHROPLASTY Left 07/22/2022   Procedure: TOTAL KNEE ARTHROPLASTY;  Surgeon: [name], MD;  Location: [facility];  Service: Orthopedics;  Laterality: Left;    Social History   Socioeconomic History   Marital status: Married    Spouse name: Not on file   Number of children: 3   Years of education: Not on file   Highest education level: Not on file  Occupational History   Occupation: retired  Tobacco Use   Smoking status: Never   Smokeless tobacco: Never  Vaping Use   Vaping Use: Never used  Substance  and Sexual Activity   Alcohol use: Yes    Comment: occ beer   Drug use: No   Sexual activity: Yes    Birth control/protection: Surgical    Comment: hyst  Other Topics Concern   Not on file  Social History Narrative   Not on file   Social Determinants of Health   Financial Resource Strain: Medium Risk (01/30/2021)   Overall Financial Resource Strain (CARDIA)    Difficulty of Paying Living Expenses: Somewhat hard  Food Insecurity: No Food Insecurity (08/15/2022)   Hunger Vital Sign    Worried About Running Out of Food in the Last Year: Never true    Ran Out of Food in the Last Year: Never true  Transportation Needs: No Transportation Needs (08/15/2022)   PRAPARE - Hydrologist (Medical): No    Lack of Transportation  (Non-Medical): No  Physical Activity: Insufficiently Active (01/30/2021)   Exercise Vital Sign    Days of Exercise per Week: 2 days    Minutes of Exercise per Session: 10 min  Stress: No Stress Concern Present (01/30/2021)   [place]    Feeling of Stress : Only a little  Social Connections: Moderately Integrated (01/30/2021)   Social Connection and Isolation Panel [NHANES]    Frequency of Communication with Friends and Family: Twice a week    Frequency of Social Gatherings with Friends and Family: Twice a week    Attends Religious Services: More than 4 times per year    Active Member of [employer] or Organizations: No    Attends Archivist Meetings: Never    Marital Status: Married  Human resources officer Violence: Not At Risk (08/15/2022)   Humiliation, Afraid, Rape, and Kick questionnaire    Fear of Current or Ex-Partner: No    Emotionally Abused: No    Physically Abused: No    Sexually Abused: No   Family History  Problem Relation Age of Onset   Cancer Father    Heart disease Brother    Alcohol abuse Brother    Depression Maternal Grandmother    Depression Grandchild    Breast cancer Daughter       VITAL SIGNS BP 123/63   Pulse 89   Temp (!) 96.8 F (36 C)   Resp 20   Ht '5\' 5"'$  (1.651 m)   Wt 194 lb 3.2 oz (88.1 kg)   SpO2 98%   BMI 32.32 kg/m   Outpatient Encounter Medications as of 08/26/2022  Medication Sig   acetaminophen (TYLENOL) 500 MG tablet Take 1,000 mg by mouth every 8 (eight) hours as needed for headache, moderate pain or mild pain.   apixaban (ELIQUIS) 5 MG TABS tablet Take 1 tablet (5 mg total) by mouth 2 (two) times daily.   busPIRone (BUSPAR) 30 MG tablet Take 1 tablet (30 mg total) by mouth 2 (two) times daily.   Cholecalciferol (VITAMIN D) 125 MCG (5000 UT) CAPS Take 5,000 Units by mouth daily.   DULoxetine (CYMBALTA) 60 MG capsule Take 1 capsule (60 mg total) by mouth 2 (two) times  daily.   eszopiclone (LUNESTA) 2 MG TABS tablet Take immediately before bedtime   HYDROcodone-acetaminophen (NORCO/VICODIN) 5-325 MG tablet Take 1 tablet by mouth every 6 (six) hours as needed for severe pain (through 08-29-22).   LORazepam (ATIVAN) 0.5 MG tablet Take 0.5 mg by mouth 2 (two) times daily as needed for anxiety (through 09-05-22).   metoprolol tartrate (LOPRESSOR)  25 MG tablet Take 1 tablet (25 mg total) by mouth 2 (two) times daily.   MYRBETRIQ 50 MG TB24 tablet Take 50 mg by mouth daily.   polyethylene glycol (MIRALAX / GLYCOLAX) 17 g packet Take 17 g by mouth daily as needed.   senna (SENOKOT) 8.6 MG TABS tablet Take 1 tablet (8.6 mg total) by mouth 2 (two) times daily.   SUMAtriptan (IMITREX) 100 MG tablet Take 100 mg by mouth every 2 (two) hours as needed for migraine or headache. May repeat in 2 hours if headache persists or recurs.   [DISCONTINUED] HYDROcodone-acetaminophen (NORCO/VICODIN) 5-325 MG tablet Take 1 tablet by mouth every 4 (four) hours as needed for moderate pain (pain score 4-6). (Patient taking differently: Take 1 tablet by mouth every 6 (six) hours as needed for moderate pain (pain 8-10). Through 08-29-22)   [DISCONTINUED] LORazepam (ATIVAN) 0.5 MG tablet Take 1 tablet (0.5 mg total) by mouth 2 (two) times daily as needed for anxiety. (Patient taking differently: Take 0.5 mg by mouth 2 (two) times daily as needed for anxiety. Through 09-05-22)   No facility-administered encounter medications on file as of 08/26/2022.     SIGNIFICANT DIAGNOSTIC EXAMS  TODAY  08-16-22: chest x-ray: 1. No acute intrathoracic process.   08-21-22: ct of head:  1. Possible sulcal effacement and loss of gray-white differentiation in the posterior right frontal lobe may represent subacute infarct versus artifact. Consider further evaluation with MRI. 2. Mild chronic small vessel ischemia.  08-22-22: MRI head:  1. No acute intracranial abnormality. 2. Findings of chronic small  vessel ischemia and volume loss.  LABS REVIEWED TODAY  08-04-22: wbc 6.9; hgb 10.1; hct 31.8; mcv 97.5 plt 371; glucose 93; bun 15; creat 0.66; k+ 4.0; na++ 135; ca 8.8; gfr >60 mag 1.8; tsh 1.460; free t3: 1.8; free t4: 1.02 08-14-22: wbc 9.3; hgb 10.5; hct 31.8 mcv 94.9 plt 324; glucose 93; bun 15 creat 0.66; k+ 3.6; na++ 138; ca 9.1; gfr >60 08-23-22: wbc 9.1; hgb 10.2; hct 31.8; mcv 94.9 plt 379'; glucose 100; bun 12; creat 0.61; k+ 4.3; na++ 139; ca 9.0; gfr >60     Review of Systems  Constitutional:  Negative for malaise/fatigue.  Respiratory:  Negative for cough and shortness of breath.   Cardiovascular:  Negative for chest pain, palpitations and leg swelling.  Gastrointestinal:  Negative for abdominal pain, constipation and heartburn.  Musculoskeletal:  Negative for back pain, joint pain and myalgias.  Skin: Negative.   Neurological:  Negative for dizziness.  Psychiatric/Behavioral:  The patient is not nervous/anxious.     Physical Exam Constitutional:      General: She is not in acute distress.    Appearance: She is well-developed. She is not diaphoretic.  Neck:     Thyroid: No thyromegaly.  Cardiovascular:     Rate and Rhythm: Normal rate. Rhythm irregular.     Pulses: Normal pulses.     Heart sounds: Normal heart sounds.  Pulmonary:     Effort: Pulmonary effort is normal. No respiratory distress.     Breath sounds: Normal breath sounds.  Abdominal:     General: Bowel sounds are normal. There is no distension.     Palpations: Abdomen is soft.     Tenderness: There is no abdominal tenderness.  Musculoskeletal:     Cervical back: Neck supple.     Right lower leg: No edema.     Left lower leg: No edema.     Comments: Is able to move  all extremities Left lower extremity in ace wrap splint   Lymphadenopathy:     Cervical: No cervical adenopathy.  Skin:    General: Skin is warm and dry.     Comments: Incision line without signs of infection present has wound vac in  place   Neurological:     Mental Status: She is alert. Mental status is at baseline.  Psychiatric:        Mood and Affect: Mood normal.      ASSESSMENT/ PLAN:  TODAY  Localized osteoarthritis left knee/periprostatic fracture around internal prosthetic  left knee joint sequela: will continue therapy as directed and will follow up with orthopedics. Has vicodin 5/325 mg every 6 hours as needed through 08-29-22.   2. History of cva: is on eliquis 5 mg twice daily   3. Paroxsymal atrial fibrillation: heart rate is stable will continue lopressor 25 mg twice daily for rate control; eliquis 5 mg twice daily   4. Migraine without status migrainosus non intractable unspecified migraine type: will continue imitrex 100 mg up to twice daily as needed  5.SUI (stress urinary incontinence) in female: will continue myrbetriq 50 mg daily   6. Chronic constipation: will continue senna s twice daily and miralax daily as needed  7. Chronic insomnia: will continue lunesta 2 mg nightly   8. Chronic anxiety: will continue cymbalta 60 mg twice daily (also takes for pain); buspar 30 mg twice daily; ativan 0.5 mg twice daily as needed through 22-97-98.   9. Uncomplicated asthma unspecified asthma severity; unspecified  whether persistent: will monitor   10. Chronic pain syndrome: will continue cymbalta 60 mg twice daily   11. Chronic anemia: hgb stable at 10.1   12. Vascular dementia without behavioral disturbance; 08-22-22 MRI demonstrates chronic small vessel ischemia and volume loss.     Ok Edwards NP [facility]   call [phone]

## 2022-08-27 ENCOUNTER — Other Ambulatory Visit: Payer: Self-pay | Admitting: Adult Health

## 2022-08-27 ENCOUNTER — Telehealth: Payer: Self-pay | Admitting: Psychiatry

## 2022-08-27 DIAGNOSIS — M1712 Unilateral primary osteoarthritis, left knee: Secondary | ICD-10-CM | POA: Diagnosis not present

## 2022-08-27 MED ORDER — MODAFINIL 200 MG PO TABS: 200.0000 mg | ORAL_TABLET | Freq: Every day | ORAL | 0 refills | Status: DC

## 2022-08-27 NOTE — Telephone Encounter (Signed)
Unable to reach numbers on file will try again

## 2022-08-27 NOTE — Telephone Encounter (Signed)
Pt's daughter, Jesus Genera LVM @ 2:41p.  She is on DPR.  She said she has some medication questions for Janett Billow regarding her mom.  Next appt 11/8

## 2022-08-28 ENCOUNTER — Encounter: Payer: Self-pay | Admitting: Internal Medicine

## 2022-08-28 ENCOUNTER — Encounter (HOSPITAL_COMMUNITY): Payer: PPO

## 2022-08-28 ENCOUNTER — Non-Acute Institutional Stay (SKILLED_NURSING_FACILITY): Payer: PPO | Admitting: Internal Medicine

## 2022-08-28 DIAGNOSIS — R4189 Other symptoms and signs involving cognitive functions and awareness: Secondary | ICD-10-CM | POA: Diagnosis not present

## 2022-08-28 DIAGNOSIS — M9712XS Periprosthetic fracture around internal prosthetic left knee joint, sequela: Secondary | ICD-10-CM | POA: Diagnosis not present

## 2022-08-28 DIAGNOSIS — I480 Paroxysmal atrial fibrillation: Secondary | ICD-10-CM

## 2022-08-28 DIAGNOSIS — R29818 Other symptoms and signs involving the nervous system: Secondary | ICD-10-CM

## 2022-08-28 NOTE — Progress Notes (Unsigned)
NURSING HOME LOCATION:  [facility] ROOM NUMBER: [place]:  Full Code  PCP: Asencion Noble MD  This is a nursing facility follow up visit for [place] readmission within 30 days.  Interim medical record and care since last SNF visit was updated with review of diagnostic studies and change in clinical status since last visit were documented.  HPI: She was rehospitalized 10/4 - 08/23/2022 for surgical intervention of left knee fracture.  Left TKA R had been completed 9/11 and she was at the SNF for rehab when she sustained a fall fracturing the patella in her knee.  This was in the context of active COVID infection associated with weakness and lightheadedness. She completed a full course of antiviral therapy. On 10/6 revision of the total left knee arthroplasty and tendon repair with graft was completed.  Hospital course was complicated by new onset A-fib with RVR with associated hypotension and AKI.  Cardiology consulted.  Cardizem drip was initiated and low-dose metoprolol added.  Prophylactic Eliquis was also initiated due to a CHADS2 DS 2 vas score of 5. Nadir GFR was 42 and peak creatinine 1.32. She did exhibit desaturation requiring minimal oxygen temporarily.  Chest x-ray revealed no acute process. At admission there was warmth and erythema in the left lower extremity of concern for cellulitis. She completed a 7-day course of ceftriaxone; wound culture did not show any growth. At discharge chemistries were normal except for minimally elevated glucose at 100.  Creatinine was 0.61 with a GFR greater than 60.  Normochromic, normocytic anemia was improving with a final H/H of 10.1/30.8.  Review of systems: She states that she is doing "fine" and denies any active issues.  When asked why she had been hospitalized she stated that he did a "long streak" pointing to the left leg "because I broke my patella."  She was unaware of the complications postop,  specifically AF with RVR.  She stated that she had gone into the hospital on September 11, not 10/4.  She could not correctly identify her surgeon.  She denied any tachycardia at this time but stated that she had such in the past.  Constitutional: No fever, significant weight change, fatigue  Eyes: No redness, discharge, pain, vision change ENT/mouth: No nasal congestion,  purulent discharge, earache, change in hearing, sore throat  Cardiovascular: No chest pain, palpitations, paroxysmal nocturnal dyspnea, claudication, edema  Respiratory: No cough, sputum production, hemoptysis, DOE, significant snoring, apnea   Gastrointestinal: No heartburn, dysphagia, abdominal pain, nausea /vomiting, rectal bleeding, melena, change in bowels Genitourinary: No dysuria, hematuria, pyuria, incontinence, nocturia Musculoskeletal: No joint stiffness, joint swelling, weakness, pain Dermatologic: No rash, pruritus, change in appearance of skin Neurologic: No dizziness, headache, syncope, seizures, numbness, tingling Psychiatric: No significant anxiety, depression, insomnia, anorexia Endocrine: No change in hair/skin/nails, excessive thirst, excessive hunger, excessive urination  Hematologic/lymphatic: No significant bruising, lymphadenopathy, abnormal bleeding Allergy/immunology: No itchy/watery eyes, significant sneezing, urticaria, angioedema  Physical exam:  Pertinent or positive findings: Hair is disheveled.  Facies are blank.  Responses are delayed and sometimes she does not even answer.  Breath sounds are decreased.  Rhythm is irregular and rate is clinically elevated.  Abdomen is protuberant.  The left lower extremity is is wrapped.  The right pedal pulses are decreased.  She appears to have foot drop on the right with flexion contractures of all toes.  The second left toe overlaps the first toe.  She has DIP changes of the hands.  She has interosseous wasting of the hands. General appearance: Adequately  nourished; no acute distress, increased work of breathing is present.   Lymphatic: No lymphadenopathy about the head, neck, axilla. Eyes: No conjunctival inflammation or lid edema is present. There is no scleral icterus. Ears:  External ear exam shows no significant lesions or deformities.   Nose:  External nasal examination shows no deformity or inflammation. Nasal mucosa are pink and moist without lesions, exudates Oral exam:  Lips and gums are healthy appearing. There is no oropharyngeal erythema or exudate. Neck:  No thyromegaly, masses, tenderness noted.    Heart:  Normal rate and regular rhythm. S1 and S2 normal without gallop, murmur, click, rub .  Lungs: Chest clear to auscultation without wheezes, rhonchi, rales, rubs. Abdomen: Bowel sounds are normal. Abdomen is soft and nontender with no organomegaly, hernias, masses. GU: Deferred  Extremities:  No cyanosis, clubbing, edema  Neurologic exam : Cn 2-7 intact Strength equal  in upper & lower extremities Balance, Rhomberg, finger to nose testing could not be completed due to clinical state Deep tendon reflexes are equal Skin: Warm & dry w/o tenting. No significant lesions or rash.  See summary under each active problem in the Problem List with associated updated therapeutic plan

## 2022-08-28 NOTE — Assessment & Plan Note (Addendum)
Rhythm is regular and rate appears to be somewhat accelerated.  This will be monitored and the metoprolol titrated if this is a persistent finding.

## 2022-08-28 NOTE — Assessment & Plan Note (Signed)
This is in the context of recent COVID infection for which she was received antivirals. She could not correctly identify her surgeon and was unaware of having had A-fib.

## 2022-08-29 ENCOUNTER — Ambulatory Visit (HOSPITAL_COMMUNITY): Payer: PPO | Admitting: Physical Therapy

## 2022-08-29 NOTE — Telephone Encounter (Signed)
Returned call to pt's daughter. Daughter reports that pt is in SNF. Pt went in for knee replacement on 07/22/22. Daughter reports that pt was dx'd with Atrial fibrillation at that time. She reports that pain medication caused her to be "loopy." She then moved to [facility] for rehab. She had COVID the following week and rehab was discontinued. She later hit her knee while trying to ambulate to the bathroom. When she followed up with surgeon, she was found to have a fractured patella on the knee that was replaced. She then had a second surgery 08/14/22. Daughter reports that pt then had acute confusion and thought she was different places (a high school, a basement, etc). She was hospitalized for 9 days and had to have atrial fibrillation controlled. The week after the second surgery a CT scan was ordered to rule out stroke and then had an MRI. Pt's PCP is out of the office due to medical reasons.   Daughter reports that pt has been receiving ativan regularly and is "out of it." She has not been recognizing friends that are coming to visit. Daughter reports that there has been a significant change in mental status since September.   Daughter reports concern about rapid change in mother's mental status. Daughter requests advice on how she can assist her mother. Recommended that daughter request to talk with patient's medical providers to share history and the changes she has noticed in mental status. Discussed that she may want to request in-house psychiatric consult to be involved in evaluation of mental status changes and ongoing management of anxiety and depression. Discussed that this provider would be glad to coordinate care with SNF medical and/or psychiatric providers. Discussed that records from this office could be sent to SNF providers if needed. Advised daughter to call back with any questions or if there is anything that provider can do to assist.

## 2022-08-29 NOTE — Telephone Encounter (Signed)
[name]'s daughter wants to talk to you directly.I asked her if I could relay a message but she said she would rather talk to you if possible due to the questions in detail that she has. [phone]  Will you be able to speak to her?

## 2022-08-29 NOTE — Patient Instructions (Signed)
See assessment and plan under each diagnosis in the problem list and acutely for this visit 

## 2022-08-29 NOTE — Telephone Encounter (Signed)
Daughter Jordana called back reporting never received call back from message left 10/17. Contact Bhakti @ [phone] ASAP.

## 2022-08-29 NOTE — Assessment & Plan Note (Signed)
PT/OT @ SNF as tolerated.At this time she denies significant post op pain.

## 2022-08-30 ENCOUNTER — Non-Acute Institutional Stay (SKILLED_NURSING_FACILITY): Payer: PPO | Admitting: Adult Health

## 2022-08-30 ENCOUNTER — Encounter: Payer: Self-pay | Admitting: Adult Health

## 2022-08-30 DIAGNOSIS — F5104 Psychophysiologic insomnia: Secondary | ICD-10-CM

## 2022-08-30 DIAGNOSIS — I480 Paroxysmal atrial fibrillation: Secondary | ICD-10-CM | POA: Diagnosis not present

## 2022-08-30 DIAGNOSIS — F0390 Unspecified dementia without behavioral disturbance: Secondary | ICD-10-CM

## 2022-08-30 NOTE — Progress Notes (Unsigned)
Location:  [place] Room Number: NO/142/W Place of Service:  SNF (31)   CODE STATUS: FULL  Allergies  Allergen Reactions   Ticlid [Ticlopidine] Shortness Of Breath   Nsaids Other (See Comments)    Bleeding ulcer   Prednisone Other (See Comments)    Makes her stomach hurt--knows this isn't an allergy but doesn't want to take it    Monistat [Miconazole] Swelling and Rash   Sulfa Antibiotics Rash    Chief Complaint  Patient presents with   Acute Visit    Family concerns for patient    HPI:  Her family is concerned about her rapid decline in her overall status. Prior to her initial hospitalization she was able to drive short distances. Her family is aware that she does have an underlying dementia. She is having hallucinations about being in a basement; she does not where she is; does not know if anyone is around. She has been having some soft blood pressure readings. She had been on abilify in the past.   Past Medical History:  Diagnosis Date   Anemia    Arthritis    Asthma    Atrial fibrillation ([place])    COVID-19 08/05/2022   Depression    H/O head injury    Hearing loss    Heart murmur    can be heard at times, Doctor said not to worry   History of kidney stones    History of stomach ulcers    Migraine    Pneumonia    Psittacosis    Stroke [facility])     Past Surgical History:  Procedure Laterality Date   ANKLE FRACTURE SURGERY Left    APPENDECTOMY     BREAST LUMPECTOMY     CESAREAN SECTION     x 3   I & D KNEE WITH POLY EXCHANGE Left 08/16/2022   Procedure: KNEE POLY EXCHANGE;  Surgeon: Hiram Gash, MD;  Location: [place];  Service: Orthopedics;  Laterality: Left;   KNEE ARTHROSCOPY WITH PATELLAR TENDON REPAIR Left 08/16/2022   Procedure: IRRIGATION AND DEBRIDEMENT LEFT KNEE REVISION WITH PATELLAR TENDON REPAIR and poly exchange;  Surgeon: Hiram Gash, MD;  Location: [place];  Service: Orthopedics;  Laterality: Left;   PARTIAL HYSTERECTOMY      ROTATOR CUFF REPAIR Right    TOTAL KNEE ARTHROPLASTY Left 07/22/2022   Procedure: TOTAL KNEE ARTHROPLASTY;  Surgeon: [name], MD;  [facility];  Service: Orthopedics;  Laterality: Left;    Social History   Socioeconomic History   Marital status: Married    Spouse name: Not on file   Number of children: 3   Years of education: Not on file   Highest education level: Not on file  Occupational History   Occupation: retired  Tobacco Use   Smoking status: Never   Smokeless tobacco: Never  Vaping Use   Vaping Use: Never used  Substance and Sexual Activity   Alcohol use: Yes    Comment: occ beer   Drug use: No   Sexual activity: Yes    Birth control/protection: Surgical    Comment: hyst  Other Topics Concern   Not on file  Social History Narrative   Not on file   Social Determinants of Health   Financial Resource Strain: Medium Risk (01/30/2021)   Overall Financial Resource Strain (CARDIA)    Difficulty of Paying Living Expenses: Somewhat hard  Food Insecurity: No Food Insecurity (08/15/2022)   Hunger Vital Sign  Worried About Charity fundraiser in the Last Year: Never true    [place] in the Last Year: Never true  Transportation Needs: No Transportation Needs (08/15/2022)   PRAPARE - Hydrologist (Medical): No    Lack of Transportation (Non-Medical): No  Physical Activity: Insufficiently Active (01/30/2021)   Exercise Vital Sign    Days of Exercise per Week: 2 days    Minutes of Exercise per Session: 10 min  Stress: No Stress Concern Present (01/30/2021)   [place]    Feeling of Stress : Only a little  Social Connections: Moderately Integrated (01/30/2021)   Social Connection and Isolation Panel [NHANES]    Frequency of Communication with Friends and Family: Twice a week    Frequency of Social Gatherings with Friends and Family: Twice a week     Attends Religious Services: More than 4 times per year    Active Member of [employer] or Organizations: No    Attends Archivist Meetings: Never    Marital Status: Married  Human resources officer Violence: Not At Risk (08/15/2022)   Humiliation, Afraid, Rape, and Kick questionnaire    Fear of Current or Ex-Partner: No    Emotionally Abused: No    Physically Abused: No    Sexually Abused: No   Family History  Problem Relation Age of Onset   Cancer Father    Heart disease Brother    Alcohol abuse Brother    Depression Maternal Grandmother    Depression Grandchild    Breast cancer Daughter       VITAL SIGNS BP 126/80   Pulse 69   Temp 97.9 F (36.6 C)   Resp 20   Ht '5\' 5"'$  (1.651 m)   Wt 194 lb (88 kg)   SpO2 95%   BMI 32.28 kg/m   Outpatient Encounter Medications as of 08/30/2022  Medication Sig   acetaminophen (TYLENOL) 500 MG tablet Take 1,000 mg by mouth every 8 (eight) hours as needed for headache, moderate pain or mild pain.   apixaban (ELIQUIS) 5 MG TABS tablet Take 1 tablet (5 mg total) by mouth 2 (two) times daily.   busPIRone (BUSPAR) 30 MG tablet Take 1 tablet (30 mg total) by mouth 2 (two) times daily.   Cholecalciferol (VITAMIN D) 125 MCG (5000 UT) CAPS Take 5,000 Units by mouth daily.   DULoxetine (CYMBALTA) 60 MG capsule Take 1 capsule (60 mg total) by mouth 2 (two) times daily.   eszopiclone (LUNESTA) 2 MG TABS tablet Take immediately before bedtime   HYDROcodone-acetaminophen (NORCO/VICODIN) 5-325 MG tablet Take 1 tablet by mouth every 6 (six) hours as needed for severe pain (through 08-29-22).   LORazepam (ATIVAN) 0.5 MG tablet Take 0.5 mg by mouth 2 (two) times daily as needed for anxiety (through 09-05-22).   metoprolol tartrate (LOPRESSOR) 25 MG tablet Take 1 tablet (25 mg total) by mouth 2 (two) times daily.   modafinil (PROVIGIL) 200 MG tablet Take 1 tablet (200 mg total) by mouth daily.   MYRBETRIQ 50 MG TB24 tablet Take 50 mg by mouth daily.    polyethylene glycol (MIRALAX / GLYCOLAX) 17 g packet Take 17 g by mouth daily as needed.   senna (SENOKOT) 8.6 MG TABS tablet Take 1 tablet (8.6 mg total) by mouth 2 (two) times daily.   SUMAtriptan (IMITREX) 100 MG tablet Take 100 mg by mouth every 2 (two) hours as needed for migraine  or headache. May repeat in 2 hours if headache persists or recurs.   No facility-administered encounter medications on file as of 08/30/2022.     SIGNIFICANT DIAGNOSTIC EXAMS  PREVIOUS   08-16-22: chest x-ray: 1. No acute intrathoracic process.   08-21-22: ct of head:  1. Possible sulcal effacement and loss of gray-white differentiation in the posterior right frontal lobe may represent subacute infarct versus artifact. Consider further evaluation with MRI. 2. Mild chronic small vessel ischemia.  08-22-22: MRI head:  1. No acute intracranial abnormality. 2. Findings of chronic small vessel ischemia and volume loss.  NO NEW EXAMS   LABS REVIEWED PREVIOUS   08-04-22: wbc 6.9; hgb 10.1; hct 31.8; mcv 97.5 plt 371; glucose 93; bun 15; creat 0.66; k+ 4.0; na++ 135; ca 8.8; gfr >60 mag 1.8; tsh 1.460; free t3: 1.8; free t4: 1.02 08-14-22: wbc 9.3; hgb 10.5; hct 31.8 mcv 94.9 plt 324; glucose 93; bun 15 creat 0.66; k+ 3.6; na++ 138; ca 9.1; gfr >60 08-23-22: wbc 9.1; hgb 10.2; hct 31.8; mcv 94.9 plt 379'; glucose 100; bun 12; creat 0.61; k+ 4.3; na++ 139; ca 9.0; gfr >60  NO NEW LABS    Review of Systems  Constitutional:  Negative for malaise/fatigue.  Respiratory:  Negative for cough and shortness of breath.   Cardiovascular:  Negative for chest pain, palpitations and leg swelling.  Gastrointestinal:  Negative for abdominal pain, constipation and heartburn.  Musculoskeletal:  Negative for back pain, joint pain and myalgias.  Skin: Negative.   Neurological:  Negative for dizziness.  Psychiatric/Behavioral:  The patient is not nervous/anxious.    Physical Exam Constitutional:      General: She is not in  acute distress.    Appearance: She is well-developed. She is not diaphoretic.  Neck:     Thyroid: No thyromegaly.  Cardiovascular:     Rate and Rhythm: Normal rate and regular rhythm.     Pulses: Normal pulses.     Heart sounds: Normal heart sounds.  Pulmonary:     Effort: Pulmonary effort is normal. No respiratory distress.     Breath sounds: Normal breath sounds.  Abdominal:     General: Bowel sounds are normal. There is no distension.     Palpations: Abdomen is soft.     Tenderness: There is no abdominal tenderness.  Musculoskeletal:     Cervical back: Neck supple.     Right lower leg: No edema.     Left lower leg: No edema.     Comments:  Is able to move all extremities Left lower extremity in ace wrap splint    Lymphadenopathy:     Cervical: No cervical adenopathy.  Skin:    General: Skin is warm and dry.  Neurological:     Mental Status: She is alert. Mental status is at baseline.  Psychiatric:        Mood and Affect: Mood normal.      ASSESSMENT/ PLAN:  TODAY  Paroxsymal atrial fibrillation: will lower lopressor to 12.5 mg twice daily due to soft blood pressure readings  2. Psychosis in elderly: is worse will begin abilify 2 mg daily for 2 weeks then increase to 5 mg daily   3. Chronic insomnia will lower lunesta to 1 mg nightly    Ok Edwards NP [facility]   call [phone]

## 2022-09-02 ENCOUNTER — Encounter (HOSPITAL_COMMUNITY): Payer: PPO

## 2022-09-02 ENCOUNTER — Other Ambulatory Visit: Payer: Self-pay | Admitting: Psychiatry

## 2022-09-02 DIAGNOSIS — G2401 Drug induced subacute dyskinesia: Secondary | ICD-10-CM

## 2022-09-03 ENCOUNTER — Ambulatory Visit: Payer: PPO | Admitting: Internal Medicine

## 2022-09-03 NOTE — Telephone Encounter (Signed)
Patient was hospitalized and is now at [facility]. This medication was discontinued.

## 2022-09-04 ENCOUNTER — Encounter (HOSPITAL_COMMUNITY): Payer: PPO

## 2022-09-04 DIAGNOSIS — F332 Major depressive disorder, recurrent severe without psychotic features: Secondary | ICD-10-CM | POA: Diagnosis not present

## 2022-09-04 DIAGNOSIS — F411 Generalized anxiety disorder: Secondary | ICD-10-CM | POA: Diagnosis not present

## 2022-09-04 DIAGNOSIS — G2401 Drug induced subacute dyskinesia: Secondary | ICD-10-CM | POA: Diagnosis not present

## 2022-09-04 DIAGNOSIS — F0390 Unspecified dementia without behavioral disturbance: Secondary | ICD-10-CM | POA: Insufficient documentation

## 2022-09-06 ENCOUNTER — Non-Acute Institutional Stay (SKILLED_NURSING_FACILITY): Payer: PPO | Admitting: Adult Health

## 2022-09-06 ENCOUNTER — Encounter: Payer: Self-pay | Admitting: Adult Health

## 2022-09-06 DIAGNOSIS — R4189 Other symptoms and signs involving cognitive functions and awareness: Secondary | ICD-10-CM | POA: Diagnosis not present

## 2022-09-06 DIAGNOSIS — R29818 Other symptoms and signs involving the nervous system: Secondary | ICD-10-CM

## 2022-09-06 DIAGNOSIS — I480 Paroxysmal atrial fibrillation: Secondary | ICD-10-CM | POA: Diagnosis not present

## 2022-09-06 DIAGNOSIS — F0150 Vascular dementia without behavioral disturbance: Secondary | ICD-10-CM | POA: Diagnosis not present

## 2022-09-06 NOTE — Progress Notes (Signed)
Location:  [place] Room Number: 025 Place of Service:  SNF (31) Provider: Ok Edwards, NP   CODE STATUS: FULL CODE  Allergies  Allergen Reactions   Ticlid [Ticlopidine] Shortness Of Breath   Nsaids Other (See Comments)    Bleeding ulcer   Prednisone Other (See Comments)    Makes her stomach hurt--knows this isn't an allergy but doesn't want to take it    Monistat [Miconazole] Swelling and Rash   Sulfa Antibiotics Rash    Chief Complaint  Patient presents with   Acute Visit    Care plan meeting    HPI:  We have come together for her care plan meeting. Family present  BIMS 8/15 mood 7/30: not sleeping well; nervous; depression. She is non-ambulatory with no falls. She is dependent for her adl care. She is incontinent of bladder and frequently incontinent of bowel. Dietary: set up with eating; regular diet; weight is 194.2 pounds; appetite 1-100% is on supplements twice daily. Therapy none at this time. Activities: 1:1 activities.  She is sundowning; having hallucinations. Her abilify is being titrated to 5 mg daily. She continues to be followed for her chronic illnesses including:  Paroxsymal atrial fibrillation  Vascular dementia without behavioral disturbance  Neurocognitive deficits  Past Medical History:  Diagnosis Date   Anemia    Arthritis    Asthma    Atrial fibrillation ([place])    COVID-19 08/05/2022   Depression    H/O head injury    Hearing loss    Heart murmur    can be heard at times, Doctor said not to worry   History of kidney stones    History of stomach ulcers    Migraine    Pneumonia    Psittacosis    Stroke [facility])     Past Surgical History:  Procedure Laterality Date   ANKLE FRACTURE SURGERY Left    APPENDECTOMY     BREAST LUMPECTOMY     CESAREAN SECTION     x 3   I & D KNEE WITH POLY EXCHANGE Left 08/16/2022   Procedure: KNEE POLY EXCHANGE;  Surgeon: Hiram Gash, MD;  Location: [place];  Service: Orthopedics;   Laterality: Left;   KNEE ARTHROSCOPY WITH PATELLAR TENDON REPAIR Left 08/16/2022   Procedure: IRRIGATION AND DEBRIDEMENT LEFT KNEE REVISION WITH PATELLAR TENDON REPAIR and poly exchange;  Surgeon: Hiram Gash, MD;  Location: [place];  Service: Orthopedics;  Laterality: Left;   PARTIAL HYSTERECTOMY     ROTATOR CUFF REPAIR Right    TOTAL KNEE ARTHROPLASTY Left 07/22/2022   Procedure: TOTAL KNEE ARTHROPLASTY;  Surgeon: [name], MD;  [facility];  Service: Orthopedics;  Laterality: Left;    Social History   Socioeconomic History   Marital status: Married    Spouse name: Not on file   Number of children: 3   Years of education: Not on file   Highest education level: Not on file  Occupational History   Occupation: retired  Tobacco Use   Smoking status: Never   Smokeless tobacco: Never  Vaping Use   Vaping Use: Never used  Substance and Sexual Activity   Alcohol use: Yes    Comment: occ beer   Drug use: No   Sexual activity: Yes    Birth control/protection: Surgical    Comment: hyst  Other Topics Concern   Not on file  Social History Narrative   Not on file   Social Determinants of Health  Financial Resource Strain: Medium Risk (01/30/2021)   Overall Financial Resource Strain (CARDIA)    Difficulty of Paying Living Expenses: Somewhat hard  Food Insecurity: No Food Insecurity (08/15/2022)   Hunger Vital Sign    Worried About Running Out of Food in the Last Year: Never true    Ran Out of Food in the Last Year: Never true  Transportation Needs: No Transportation Needs (08/15/2022)   PRAPARE - Hydrologist (Medical): No    Lack of Transportation (Non-Medical): No  Physical Activity: Insufficiently Active (01/30/2021)   Exercise Vital Sign    Days of Exercise per Week: 2 days    Minutes of Exercise per Session: 10 min  Stress: No Stress Concern Present (01/30/2021)   [place]    Feeling of Stress : Only a little  Social Connections: Moderately Integrated (01/30/2021)   Social Connection and Isolation Panel [NHANES]    Frequency of Communication with Friends and Family: Twice a week    Frequency of Social Gatherings with Friends and Family: Twice a week    Attends Religious Services: More than 4 times per year    Active Member of [employer] or Organizations: No    Attends Archivist Meetings: Never    Marital Status: Married  Human resources officer Violence: Not At Risk (08/15/2022)   Humiliation, Afraid, Rape, and Kick questionnaire    Fear of Current or Ex-Partner: No    Emotionally Abused: No    Physically Abused: No    Sexually Abused: No   Family History  Problem Relation Age of Onset   Cancer Father    Heart disease Brother    Alcohol abuse Brother    Depression Maternal Grandmother    Depression Grandchild    Breast cancer Daughter       VITAL SIGNS BP 128/86   Pulse 88   Temp (!) 97.3 F (36.3 C)   Resp 20   Ht '5\' 5"'$  (1.651 m)   Wt 194 lb 12.8 oz (88.4 kg)   SpO2 95%   BMI 32.42 kg/m   Outpatient Encounter Medications as of 09/06/2022  Medication Sig   acetaminophen (TYLENOL) 500 MG tablet Take 1,000 mg by mouth every 8 (eight) hours as needed for headache, moderate pain or mild pain.   apixaban (ELIQUIS) 5 MG TABS tablet Take 1 tablet (5 mg total) by mouth 2 (two) times daily.   busPIRone (BUSPAR) 30 MG tablet Take 1 tablet (30 mg total) by mouth 2 (two) times daily.   Cholecalciferol (VITAMIN D) 125 MCG (5000 UT) CAPS Take 5,000 Units by mouth daily.   DULoxetine (CYMBALTA) 60 MG capsule Take 1 capsule (60 mg total) by mouth 2 (two) times daily.   eszopiclone (LUNESTA) 2 MG TABS tablet Take immediately before bedtime   HYDROcodone-acetaminophen (NORCO/VICODIN) 5-325 MG tablet Take 1 tablet by mouth every 6 (six) hours as needed for severe pain (through 08-29-22).   LORazepam (ATIVAN) 0.5 MG tablet Take 0.5 mg by mouth  2 (two) times daily as needed for anxiety (through 09-05-22).   metoprolol tartrate (LOPRESSOR) 25 MG tablet Take 1 tablet (25 mg total) by mouth 2 (two) times daily.   modafinil (PROVIGIL) 200 MG tablet Take 1 tablet (200 mg total) by mouth daily.   MYRBETRIQ 50 MG TB24 tablet Take 50 mg by mouth daily.   polyethylene glycol (MIRALAX / GLYCOLAX) 17 g packet Take 17 g by mouth daily as  needed.   senna (SENOKOT) 8.6 MG TABS tablet Take 1 tablet (8.6 mg total) by mouth 2 (two) times daily.   SUMAtriptan (IMITREX) 100 MG tablet Take 100 mg by mouth every 2 (two) hours as needed for migraine or headache. May repeat in 2 hours if headache persists or recurs.   No facility-administered encounter medications on file as of 09/06/2022.     SIGNIFICANT DIAGNOSTIC EXAMS  PREVIOUS   08-16-22: chest x-ray: 1. No acute intrathoracic process.   08-21-22: ct of head:  1. Possible sulcal effacement and loss of gray-white differentiation in the posterior right frontal lobe may represent subacute infarct versus artifact. Consider further evaluation with MRI. 2. Mild chronic small vessel ischemia.  08-22-22: MRI head:  1. No acute intracranial abnormality. 2. Findings of chronic small vessel ischemia and volume loss.  NO NEW EXAMS   LABS REVIEWED PREVIOUS   08-04-22: wbc 6.9; hgb 10.1; hct 31.8; mcv 97.5 plt 371; glucose 93; bun 15; creat 0.66; k+ 4.0; na++ 135; ca 8.8; gfr >60 mag 1.8; tsh 1.460; free t3: 1.8; free t4: 1.02 08-14-22: wbc 9.3; hgb 10.5; hct 31.8 mcv 94.9 plt 324; glucose 93; bun 15 creat 0.66; k+ 3.6; na++ 138; ca 9.1; gfr >60 08-23-22: wbc 9.1; hgb 10.2; hct 31.8; mcv 94.9 plt 379'; glucose 100; bun 12; creat 0.61; k+ 4.3; na++ 139; ca 9.0; gfr >60  NO NEW LABS    Review of Systems  Constitutional:  Negative for malaise/fatigue.  Respiratory:  Negative for cough and shortness of breath.   Cardiovascular:  Negative for chest pain, palpitations and leg swelling.  Gastrointestinal:   Negative for abdominal pain, constipation and heartburn.  Musculoskeletal:  Negative for back pain, joint pain and myalgias.  Skin: Negative.   Neurological:  Negative for dizziness.  Psychiatric/Behavioral:  The patient is not nervous/anxious.    Physical Exam Constitutional:      General: She is not in acute distress.    Appearance: She is not diaphoretic.  Eyes:     Conjunctiva/sclera: Conjunctivae normal.  Neck:     Thyroid: No thyromegaly.     Vascular: No JVD.  Cardiovascular:     Rate and Rhythm: Normal rate and regular rhythm.     Pulses: Normal pulses.  Pulmonary:     Effort: Pulmonary effort is normal. No respiratory distress.     Breath sounds: Normal breath sounds. No wheezing.  Abdominal:     General: Bowel sounds are normal. There is no distension.     Palpations: Abdomen is soft.     Tenderness: There is no abdominal tenderness.  Musculoskeletal:     Cervical back: Neck supple.     Right lower leg: No edema.     Left lower leg: No edema.     Comments:   Is able to move all extremities Left lower extremity in ace wrap splint     Lymphadenopathy:     Cervical: No cervical adenopathy.  Skin:    General: Skin is warm and dry.  Neurological:     Mental Status: She is alert. Mental status is at baseline.  Psychiatric:        Mood and Affect: Mood normal.      ASSESSMENT/ PLAN:  TODAY  Paroxsymal atrial fibrillation Vascular dementia without behavioral disturbance Neurocognitive deficits   Will will begin aricept 5 mg for one month then 10 mg nightly to begin in early November; will check vitamin B 12 and vitamin D  Is due to  see neurology in [place] in November  Will continue current plan of care Will continue to monitor her status.    Time spent with patient: 40 minutes: medications; therapy; dietary.     Ok Edwards NP [facility]  call [phone]

## 2022-09-09 ENCOUNTER — Other Ambulatory Visit: Payer: Self-pay | Admitting: Adult Health

## 2022-09-09 ENCOUNTER — Encounter: Payer: Self-pay | Admitting: *Deleted

## 2022-09-09 MED ORDER — ESZOPICLONE 1 MG PO TABS: 1.0000 mg | ORAL_TABLET | Freq: Every evening | ORAL | 0 refills | Status: DC | PRN

## 2022-09-09 MED ORDER — LORAZEPAM 0.5 MG PO TABS: 0.2500 mg | ORAL_TABLET | Freq: Two times a day (BID) | ORAL | 0 refills | Status: DC | PRN

## 2022-09-09 NOTE — Progress Notes (Signed)
[facility] Quality Team Note  Name: [name] Date of Birth: 1946/07/27 MRN: [id number] Date: 09/09/2022  [facility] Quality Team has reviewed this patient's chart, please see recommendations below:  Dexa Scan Scheduling; Pt had fracture on 08/22/2022.  Pt would need dexa/bone density ordered and completed before deadline 02/18/2023 in order to close gap.

## 2022-09-10 ENCOUNTER — Other Ambulatory Visit: Payer: Self-pay | Admitting: Adult Health

## 2022-09-10 DIAGNOSIS — M1712 Unilateral primary osteoarthritis, left knee: Secondary | ICD-10-CM | POA: Diagnosis not present

## 2022-09-10 MED ORDER — MODAFINIL 200 MG PO TABS: 200.0000 mg | ORAL_TABLET | Freq: Every day | ORAL | 0 refills | Status: DC

## 2022-09-11 DIAGNOSIS — F332 Major depressive disorder, recurrent severe without psychotic features: Secondary | ICD-10-CM | POA: Diagnosis not present

## 2022-09-11 DIAGNOSIS — F411 Generalized anxiety disorder: Secondary | ICD-10-CM | POA: Diagnosis not present

## 2022-09-13 ENCOUNTER — Telehealth: Payer: Self-pay | Admitting: Psychiatry

## 2022-09-13 DIAGNOSIS — R488 Other symbolic dysfunctions: Secondary | ICD-10-CM | POA: Diagnosis not present

## 2022-09-13 DIAGNOSIS — R278 Other lack of coordination: Secondary | ICD-10-CM | POA: Diagnosis not present

## 2022-09-13 DIAGNOSIS — I639 Cerebral infarction, unspecified: Secondary | ICD-10-CM | POA: Diagnosis not present

## 2022-09-13 DIAGNOSIS — R262 Difficulty in walking, not elsewhere classified: Secondary | ICD-10-CM | POA: Diagnosis not present

## 2022-09-13 DIAGNOSIS — Z741 Need for assistance with personal care: Secondary | ICD-10-CM | POA: Diagnosis not present

## 2022-09-13 DIAGNOSIS — M9712XD Periprosthetic fracture around internal prosthetic left knee joint, subsequent encounter: Secondary | ICD-10-CM | POA: Diagnosis not present

## 2022-09-13 DIAGNOSIS — M6281 Muscle weakness (generalized): Secondary | ICD-10-CM | POA: Diagnosis not present

## 2022-09-13 DIAGNOSIS — M159 Polyosteoarthritis, unspecified: Secondary | ICD-10-CM | POA: Diagnosis not present

## 2022-09-13 NOTE — Telephone Encounter (Signed)
Next visit is 09/18/22. [name], Sara's spouse came by the office. He is listed on her DPR to talk to. [name] has been at [facility] and had a total knee replacement done on 9/11. She then had another knee surgery done 2-1/2 weeks later because she was dropped while going to the bathroom in the nursing home. She is wheelchair bound with a damaged tendon and has a cast on her leg for 3 more weeks so it can continue to heal. She has been on hydrocodone and morphine for the knee pain and has had a brain MRI and has been diagnosed with dementia. Her husband is frustrated and the kids are. Could someone please call [name] about this. His phone number is [phone].

## 2022-09-16 NOTE — Telephone Encounter (Signed)
LVM to RC 

## 2022-09-16 NOTE — Telephone Encounter (Signed)
Husband said that patient is in [facility] and will not be able to come into the office for scheduled appt on 11/8. He said he cannot be at the [facility] to make it a phone visit at that time and is unsure how much information the patient will be able to provide with her dementia diagnosis. Do you want to reschedule appt?

## 2022-09-16 NOTE — Telephone Encounter (Signed)
Husband notified. Asked Mickel Baas to cancel appt.

## 2022-09-17 ENCOUNTER — Encounter: Payer: Self-pay | Admitting: Adult Health

## 2022-09-17 ENCOUNTER — Non-Acute Institutional Stay (SKILLED_NURSING_FACILITY): Payer: PPO | Admitting: Adult Health

## 2022-09-17 DIAGNOSIS — F419 Anxiety disorder, unspecified: Secondary | ICD-10-CM | POA: Diagnosis not present

## 2022-09-17 NOTE — Progress Notes (Unsigned)
Location:  [facility] Room Number: 142 Place of Service:  SNF (31) Provider:  Ok Edwards, NP  CODE STATUS: FULL CODE  Allergies  Allergen Reactions   Ticlid [Ticlopidine] Shortness Of Breath   Nsaids Other (See Comments)    Bleeding ulcer   Prednisone Other (See Comments)    Makes her stomach hurt--knows this isn't an allergy but doesn't want to take it    Monistat [Miconazole] Swelling and Rash   Sulfa Antibiotics Rash    Chief Complaint  Patient presents with   Acute Visit    Anxiety and delusional thoughts    HPI:  She has been having increased anxiety especially in the evening hours. She is calling her family frequently asking about locking doors and asking for deceased relatives. She does have a history of anxiety and has been taking ativan long term for her anxiety. Her family is wanting her to have something at night for her anxiety.   Past Medical History:  Diagnosis Date   Anemia    Arthritis    Asthma    Atrial fibrillation ([place])    COVID-19 08/05/2022   Depression    H/O head injury    Hearing loss    Heart murmur    can be heard at times, Doctor said not to worry   History of kidney stones    History of stomach ulcers    Migraine    Pneumonia    Psittacosis    Stroke [facility])     Past Surgical History:  Procedure Laterality Date   ANKLE FRACTURE SURGERY Left    APPENDECTOMY     BREAST LUMPECTOMY     CESAREAN SECTION     x 3   I & D KNEE WITH POLY EXCHANGE Left 08/16/2022   Procedure: KNEE POLY EXCHANGE;  Surgeon: Hiram Gash, MD;  Location: [place];  Service: Orthopedics;  Laterality: Left;   KNEE ARTHROSCOPY WITH PATELLAR TENDON REPAIR Left 08/16/2022   Procedure: IRRIGATION AND DEBRIDEMENT LEFT KNEE REVISION WITH PATELLAR TENDON REPAIR and poly exchange;  Surgeon: Hiram Gash, MD;  Location: [place];  Service: Orthopedics;  Laterality: Left;   PARTIAL HYSTERECTOMY     ROTATOR CUFF REPAIR Right    TOTAL KNEE  ARTHROPLASTY Left 07/22/2022   Procedure: TOTAL KNEE ARTHROPLASTY;  Surgeon: [name], MD;  Location: [facility];  Service: Orthopedics;  Laterality: Left;    Social History   Socioeconomic History   Marital status: Married    Spouse name: Not on file   Number of children: 3   Years of education: Not on file   Highest education level: Not on file  Occupational History   Occupation: retired  Tobacco Use   Smoking status: Never   Smokeless tobacco: Never  Vaping Use   Vaping Use: Never used  Substance and Sexual Activity   Alcohol use: Yes    Comment: occ beer   Drug use: No   Sexual activity: Yes    Birth control/protection: Surgical    Comment: hyst  Other Topics Concern   Not on file  Social History Narrative   Not on file   Social Determinants of Health   Financial Resource Strain: Medium Risk (01/30/2021)   Overall Financial Resource Strain (CARDIA)    Difficulty of Paying Living Expenses: Somewhat hard  Food Insecurity: No Food Insecurity (08/15/2022)   Hunger Vital Sign    Worried About Running Out of Food in the Last Year: Never  true    Ran Out of Food in the Last Year: Never true  Transportation Needs: No Transportation Needs (08/15/2022)   PRAPARE - Hydrologist (Medical): No    Lack of Transportation (Non-Medical): No  Physical Activity: Insufficiently Active (01/30/2021)   Exercise Vital Sign    Days of Exercise per Week: 2 days    Minutes of Exercise per Session: 10 min  Stress: No Stress Concern Present (01/30/2021)   [place]    Feeling of Stress : Only a little  Social Connections: Moderately Integrated (01/30/2021)   Social Connection and Isolation Panel [NHANES]    Frequency of Communication with Friends and Family: Twice a week    Frequency of Social Gatherings with Friends and Family: Twice a week    Attends Religious Services: More than 4 times per  year    Active Member of [employer] or Organizations: No    Attends Archivist Meetings: Never    Marital Status: Married  Human resources officer Violence: Not At Risk (08/15/2022)   Humiliation, Afraid, Rape, and Kick questionnaire    Fear of Current or Ex-Partner: No    Emotionally Abused: No    Physically Abused: No    Sexually Abused: No   Family History  Problem Relation Age of Onset   Cancer Father    Heart disease Brother    Alcohol abuse Brother    Depression Maternal Grandmother    Depression Grandchild    Breast cancer Daughter       VITAL SIGNS BP 105/73   Pulse 98   Temp (!) 97.2 F (36.2 C)   Resp 20   Ht '5\' 5"'$  (1.651 m)   Wt 184 lb 9.6 oz (83.7 kg)   SpO2 94%   BMI 30.72 kg/m   Outpatient Encounter Medications as of 09/17/2022  Medication Sig   acetaminophen (TYLENOL) 500 MG tablet Take 1,000 mg by mouth every 8 (eight) hours as needed for headache, moderate pain or mild pain.   apixaban (ELIQUIS) 5 MG TABS tablet Take 1 tablet (5 mg total) by mouth 2 (two) times daily.   ARIPiprazole (ABILIFY) 5 MG tablet Take 5 mg by mouth daily.   busPIRone (BUSPAR) 30 MG tablet Take 1 tablet (30 mg total) by mouth 2 (two) times daily.   Cholecalciferol (VITAMIN D) 125 MCG (5000 UT) CAPS Take 5,000 Units by mouth daily.   [START ON 10/15/2022] donepezil (ARICEPT) 10 MG tablet Take 10 mg by mouth at bedtime.   donepezil (ARICEPT) 5 MG tablet Take 5 mg by mouth at bedtime.   DULoxetine (CYMBALTA) 60 MG capsule Take 1 capsule (60 mg total) by mouth 2 (two) times daily.   eszopiclone (LUNESTA) 1 MG TABS tablet Take 1 tablet (1 mg total) by mouth at bedtime as needed for sleep. Take immediately before bedtime   LORazepam (ATIVAN) 0.5 MG tablet Take 0.5 tablets (0.25 mg total) by mouth 2 (two) times daily as needed for anxiety.   metoprolol tartrate (LOPRESSOR) 25 MG tablet Take 12.5 mg by mouth 2 (two) times daily.   modafinil (PROVIGIL) 200 MG tablet Take 1 tablet (200 mg  total) by mouth daily.   MYRBETRIQ 50 MG TB24 tablet Take 50 mg by mouth daily.   polyethylene glycol (MIRALAX / GLYCOLAX) 17 g packet Take 17 g by mouth daily as needed.   senna (SENOKOT) 8.6 MG TABS tablet Take 1 tablet (8.6 mg total) by  mouth 2 (two) times daily.   SUMAtriptan (IMITREX) 100 MG tablet Take 100 mg by mouth every 2 (two) hours as needed for migraine or headache. May repeat in 2 hours if headache persists or recurs.   ZINC OXIDE EX Apply topically. every shift and prn Special Instructions: Apply to sacrum, bilateral buttocks and coccyx q shift for redness.   No facility-administered encounter medications on file as of 09/17/2022.     SIGNIFICANT DIAGNOSTIC EXAMS  PREVIOUS   08-16-22: chest x-ray: 1. No acute intrathoracic process.   08-21-22: ct of head:  1. Possible sulcal effacement and loss of gray-white differentiation in the posterior right frontal lobe may represent subacute infarct versus artifact. Consider further evaluation with MRI. 2. Mild chronic small vessel ischemia.  08-22-22: MRI head:  1. No acute intracranial abnormality. 2. Findings of chronic small vessel ischemia and volume loss.  NO NEW EXAMS   LABS REVIEWED PREVIOUS   08-04-22: wbc 6.9; hgb 10.1; hct 31.8; mcv 97.5 plt 371; glucose 93; bun 15; creat 0.66; k+ 4.0; na++ 135; ca 8.8; gfr >60 mag 1.8; tsh 1.460; free t3: 1.8; free t4: 1.02 08-14-22: wbc 9.3; hgb 10.5; hct 31.8 mcv 94.9 plt 324; glucose 93; bun 15 creat 0.66; k+ 3.6; na++ 138; ca 9.1; gfr >60 08-23-22: wbc 9.1; hgb 10.2; hct 31.8; mcv 94.9 plt 379'; glucose 100; bun 12; creat 0.61; k+ 4.3; na++ 139; ca 9.0; gfr >60  NO NEW LABS    Review of Systems  Constitutional:  Negative for malaise/fatigue.  Respiratory:  Negative for cough and shortness of breath.   Cardiovascular:  Negative for chest pain, palpitations and leg swelling.  Gastrointestinal:  Negative for abdominal pain, constipation and heartburn.  Musculoskeletal:   Negative for back pain, joint pain and myalgias.  Skin: Negative.   Neurological:  Negative for dizziness.  Psychiatric/Behavioral:  The patient is nervous/anxious.    Physical Exam Constitutional:      General: She is not in acute distress.    Appearance: She is well-developed. She is not diaphoretic.  Neck:     Thyroid: No thyromegaly.  Cardiovascular:     Rate and Rhythm: Normal rate and regular rhythm.     Pulses: Normal pulses.     Heart sounds: Normal heart sounds.  Pulmonary:     Effort: Pulmonary effort is normal. No respiratory distress.     Breath sounds: Normal breath sounds.  Abdominal:     General: Bowel sounds are normal. There is no distension.     Palpations: Abdomen is soft.     Tenderness: There is no abdominal tenderness.  Musculoskeletal:     Cervical back: Neck supple.     Right lower leg: No edema.     Left lower leg: No edema.     Comments: Left leg cast   Lymphadenopathy:     Cervical: No cervical adenopathy.  Skin:    General: Skin is warm and dry.  Neurological:     Mental Status: She is alert. Mental status is at baseline.  Psychiatric:        Mood and Affect: Mood normal.       ASSESSMENT/ PLAN:  TODAY  Chronic anxiety: not adequately managed; will begin ativan 0.25 mg nightly in addition to her prn dosing. Will monitor     Ok Edwards NP [facility]  call [phone]

## 2022-09-18 ENCOUNTER — Ambulatory Visit: Payer: PPO | Admitting: Psychiatry

## 2022-09-18 DIAGNOSIS — F332 Major depressive disorder, recurrent severe without psychotic features: Secondary | ICD-10-CM | POA: Diagnosis not present

## 2022-09-18 DIAGNOSIS — F411 Generalized anxiety disorder: Secondary | ICD-10-CM | POA: Diagnosis not present

## 2022-09-19 ENCOUNTER — Encounter: Payer: Self-pay | Admitting: Adult Health

## 2022-09-19 ENCOUNTER — Other Ambulatory Visit: Payer: Self-pay | Admitting: Adult Health

## 2022-09-19 MED ORDER — LORAZEPAM 0.5 MG PO TABS: 0.2500 mg | ORAL_TABLET | Freq: Every day | ORAL | 0 refills | Status: DC

## 2022-09-20 DIAGNOSIS — M1712 Unilateral primary osteoarthritis, left knee: Secondary | ICD-10-CM | POA: Diagnosis not present

## 2022-09-24 ENCOUNTER — Encounter: Payer: Self-pay | Admitting: Internal Medicine

## 2022-09-24 ENCOUNTER — Ambulatory Visit: Payer: PPO | Attending: Internal Medicine | Admitting: Internal Medicine

## 2022-09-24 VITALS — BP 108/60 | HR 130

## 2022-09-24 DIAGNOSIS — I4819 Other persistent atrial fibrillation: Secondary | ICD-10-CM

## 2022-09-24 DIAGNOSIS — I480 Paroxysmal atrial fibrillation: Secondary | ICD-10-CM

## 2022-09-24 MED ORDER — METOPROLOL TARTRATE 25 MG PO TABS: 25.0000 mg | ORAL_TABLET | Freq: Two times a day (BID) | ORAL | 3 refills | Status: DC

## 2022-09-24 NOTE — Progress Notes (Unsigned)
Cardiology Office Note  Date: 09/24/2022   ID: [name], DOB 28-Jul-1946, MRN [id number]  PCP:  Asencion Noble, MD  Cardiologist:  Chalmers Guest, MD Electrophysiologist:  None   Reason for Office Visit: No chief complaint on file.   History of Present Illness: [name] is a 76 y.o. female  Past Medical History:  Diagnosis Date   Anemia    Arthritis    Asthma    Atrial fibrillation ([place])    COVID-19 08/05/2022   Depression    H/O head injury    Hearing loss    Heart murmur    can be heard at times, Doctor said not to worry   History of kidney stones    History of stomach ulcers    Migraine    Pneumonia    Psittacosis    Stroke ([place])    Weakness of neck 03/02/2013    Past Surgical History:  Procedure Laterality Date   ANKLE FRACTURE SURGERY Left    APPENDECTOMY     BREAST LUMPECTOMY     CESAREAN SECTION     x 3   I & D KNEE WITH POLY EXCHANGE Left 08/16/2022   Procedure: KNEE POLY EXCHANGE;  Surgeon: Hiram Gash, MD;  Location: [place];  Service: Orthopedics;  Laterality: Left;   KNEE ARTHROSCOPY WITH PATELLAR TENDON REPAIR Left 08/16/2022   Procedure: IRRIGATION AND DEBRIDEMENT LEFT KNEE REVISION WITH PATELLAR TENDON REPAIR and poly exchange;  Surgeon: Hiram Gash, MD;  Location: [place];  Service: Orthopedics;  Laterality: Left;   PARTIAL HYSTERECTOMY     ROTATOR CUFF REPAIR Right    TOTAL KNEE ARTHROPLASTY Left 07/22/2022   Procedure: TOTAL KNEE ARTHROPLASTY;  Surgeon: [name], MD;  [facility];  Service: Orthopedics;  Laterality: Left;    Current Outpatient Medications  Medication Sig Dispense Refill   acetaminophen (TYLENOL) 500 MG tablet Take 1,000 mg by mouth every 8 (eight) hours as needed for headache, moderate pain or mild pain.     apixaban (ELIQUIS) 5 MG TABS tablet Take 1 tablet (5 mg total) by mouth 2 (two) times daily. 60 tablet    ARIPiprazole (ABILIFY) 5 MG tablet Take 5 mg by mouth daily.     busPIRone (BUSPAR)  30 MG tablet Take 1 tablet (30 mg total) by mouth 2 (two) times daily. 180 tablet 0   Cholecalciferol (VITAMIN D) 125 MCG (5000 UT) CAPS Take 5,000 Units by mouth daily.     [START ON 10/15/2022] donepezil (ARICEPT) 10 MG tablet Take 10 mg by mouth at bedtime.     donepezil (ARICEPT) 5 MG tablet Take 5 mg by mouth at bedtime.     DULoxetine (CYMBALTA) 60 MG capsule Take 1 capsule (60 mg total) by mouth 2 (two) times daily. 180 capsule 1   eszopiclone (LUNESTA) 1 MG TABS tablet Take 1 tablet (1 mg total) by mouth at bedtime as needed for sleep. Take immediately before bedtime 30 tablet 0   LORazepam (ATIVAN) 0.5 MG tablet Take 0.5 tablets (0.25 mg total) by mouth 2 (two) times daily as needed for anxiety. 15 tablet 0   LORazepam (ATIVAN) 0.5 MG tablet Take 0.5 tablets (0.25 mg total) by mouth at bedtime. 15 tablet 0   metoprolol tartrate (LOPRESSOR) 25 MG tablet Take 12.5 mg by mouth 2 (two) times daily.     modafinil (PROVIGIL) 200 MG tablet Take 1 tablet (200 mg total) by mouth daily. 30 tablet 0  MYRBETRIQ 50 MG TB24 tablet Take 50 mg by mouth daily.     polyethylene glycol (MIRALAX / GLYCOLAX) 17 g packet Take 17 g by mouth daily as needed. 14 each 0   senna (SENOKOT) 8.6 MG TABS tablet Take 1 tablet (8.6 mg total) by mouth 2 (two) times daily. 120 tablet 0   SUMAtriptan (IMITREX) 100 MG tablet Take 100 mg by mouth every 2 (two) hours as needed for migraine or headache. May repeat in 2 hours if headache persists or recurs.     ZINC OXIDE EX Apply topically. every shift and prn Special Instructions: Apply to sacrum, bilateral buttocks and coccyx q shift for redness.     No current facility-administered medications for this visit.   Allergies:  Ticlid [ticlopidine], Nsaids, Prednisone, Monistat [miconazole], and Sulfa antibiotics   Social History: The patient  reports that she has never smoked. She has never used smokeless tobacco. She reports current alcohol use. She reports that she does not  use drugs.   Family History: The patient's family history includes Alcohol abuse in her brother; Breast cancer in her daughter; Cancer in her father; Depression in her grandchild and maternal grandmother; Heart disease in her brother.   ROS:  Please see the history of present illness. Otherwise, complete review of systems is positive for {NONE DEFAULTED:18576}.  All other systems are reviewed and negative.   Physical Exam: VS:  BP 108/60   Pulse (!) 130   SpO2 96% , BMI There is no height or weight on file to calculate BMI.  Wt Readings from Last 3 Encounters:  09/17/22 184 lb 9.6 oz (83.7 kg)  09/06/22 194 lb 12.8 oz (88.4 kg)  08/30/22 194 lb (88 kg)    General: Patient appears comfortable at rest. HEENT: Conjunctiva and lids normal, oropharynx clear with moist mucosa. Neck: Supple, no elevated JVP or carotid bruits, no thyromegaly. Lungs: Clear to auscultation, nonlabored breathing at rest. Cardiac: Regular rate and rhythm, no S3 or significant systolic murmur, no pericardial rub. Abdomen: Soft, nontender, no hepatomegaly, bowel sounds present, no guarding or rebound. Extremities: No pitting edema, distal pulses 2+. Skin: Warm and dry. Musculoskeletal: No kyphosis. Neuropsychiatric: Alert and oriented x3, affect grossly appropriate.  ECG:  {EKG/Telemetry Strips Reviewed:[phone]}  Recent Labwork: 07/18/2022: ALT 15; AST 24 08/04/2022: Magnesium 1.9; TSH 1.490 08/15/2022: B Natriuretic Peptide 154.2 08/23/2022: BUN 12; Creatinine, Ser 0.61; Hemoglobin 10.1; Platelets 379; Potassium 4.3; Sodium 139  No results found for: "CHOL", "TRIG", "HDL", "CHOLHDL", "VLDL", "LDLCALC", "LDLDIRECT"  Other Studies Reviewed Today:   Assessment and Plan:   Medication Adjustments/Labs and Tests Ordered: Current medicines are reviewed at length with the patient today.  Concerns regarding medicines are outlined above.   Tests Ordered: Orders Placed This Encounter  Procedures   EKG 12-Lead     Medication Changes: No orders of the defined types were placed in this encounter.   Disposition:  Follow up {follow up:15908}  Signed, Zaidyn Claire Fidel Levy, MD, 09/24/2022 10:54 AM    [facility] [address] [place], [place] [address]

## 2022-09-24 NOTE — Patient Instructions (Addendum)
Medication Instructions:  Your physician has recommended you make the following change in your medication:  Increase Metoprolol Tartrate to 25 mg tablets twice daily   Labwork: None  Testing/Procedures: Your physician has requested that you have a TEE/Cardioversion. During a TEE, sound waves are used to create images of your heart. It provides your doctor with information about the size and shape of your heart and how well your heart's chambers and valves are working. In this test, a transducer is attached to the end of a flexible tube that is guided down you throat and into your esophagus (the tube leading from your mouth to your stomach) to get a more detailed image of your heart. Once the TEE has determined that a blood clot is not present, the cardioversion begins. Electrical Cardioversion uses a jolt of electricity to your heart either through paddles or wired patches attached to your chest. This is a controlled, usually prescheduled, procedure. This procedure is done at the hospital and you are not awake during the procedure. You usually go home the day of the procedure. Please see the instruction sheet given to you today for more information.   Follow-Up: Follow up with Dr. Dellia Cloud in 3 months.   Any Other Special Instructions Will Be Listed Below (If Applicable).     If you need a refill on your cardiac medications before your next appointment, please call your pharmacy.

## 2022-09-25 ENCOUNTER — Other Ambulatory Visit: Payer: Self-pay | Admitting: Internal Medicine

## 2022-09-25 DIAGNOSIS — M549 Dorsalgia, unspecified: Secondary | ICD-10-CM | POA: Diagnosis not present

## 2022-09-25 DIAGNOSIS — F0390 Unspecified dementia without behavioral disturbance: Secondary | ICD-10-CM | POA: Diagnosis not present

## 2022-09-25 DIAGNOSIS — Z882 Allergy status to sulfonamides status: Secondary | ICD-10-CM | POA: Diagnosis not present

## 2022-09-25 DIAGNOSIS — Z888 Allergy status to other drugs, medicaments and biological substances status: Secondary | ICD-10-CM | POA: Diagnosis not present

## 2022-09-25 DIAGNOSIS — Z881 Allergy status to other antibiotic agents status: Secondary | ICD-10-CM | POA: Diagnosis not present

## 2022-09-25 DIAGNOSIS — R519 Headache, unspecified: Secondary | ICD-10-CM | POA: Diagnosis not present

## 2022-09-25 DIAGNOSIS — M79606 Pain in leg, unspecified: Secondary | ICD-10-CM | POA: Diagnosis not present

## 2022-09-27 ENCOUNTER — Ambulatory Visit: Payer: PPO | Attending: Cardiology

## 2022-10-01 DIAGNOSIS — M1711 Unilateral primary osteoarthritis, right knee: Secondary | ICD-10-CM | POA: Diagnosis not present

## 2022-10-02 DIAGNOSIS — F332 Major depressive disorder, recurrent severe without psychotic features: Secondary | ICD-10-CM | POA: Diagnosis not present

## 2022-10-02 DIAGNOSIS — F411 Generalized anxiety disorder: Secondary | ICD-10-CM | POA: Diagnosis not present

## 2022-10-07 ENCOUNTER — Other Ambulatory Visit: Payer: Self-pay | Admitting: Adult Health

## 2022-10-07 MED ORDER — ESZOPICLONE 1 MG PO TABS: 1.0000 mg | ORAL_TABLET | Freq: Every evening | ORAL | 0 refills | Status: DC | PRN

## 2022-10-07 MED ORDER — MODAFINIL 200 MG PO TABS: 200.0000 mg | ORAL_TABLET | Freq: Every day | ORAL | 0 refills | Status: DC

## 2022-10-10 ENCOUNTER — Encounter: Payer: Self-pay | Admitting: Adult Health

## 2022-10-10 ENCOUNTER — Non-Acute Institutional Stay (SKILLED_NURSING_FACILITY): Payer: PPO | Admitting: Adult Health

## 2022-10-10 DIAGNOSIS — R600 Localized edema: Secondary | ICD-10-CM | POA: Diagnosis not present

## 2022-10-10 DIAGNOSIS — G2401 Drug induced subacute dyskinesia: Secondary | ICD-10-CM | POA: Diagnosis not present

## 2022-10-10 NOTE — Progress Notes (Signed)
Location:  [facility] Room Number: 142 Place of Service:  SNF (31)   CODE STATUS: full   Allergies  Allergen Reactions   Ticlid [Ticlopidine] Shortness Of Breath   Nsaids Other (See Comments)    Bleeding ulcer   Prednisone Other (See Comments)    Makes her stomach hurt--knows this isn't an allergy but doesn't want to take it    Monistat [Miconazole] Swelling and Rash   Sulfa Antibiotics Rash    Chief Complaint  Patient presents with   Acute Visit    Patient status     HPI:  Staff has noted that she has significant abnormal movements of her tongue and chin. She does not appear to notice these movements. However; her speech is being affected by the movements. She is taking long term abilify for her psychosis.   Past Medical History:  Diagnosis Date   Anemia    Arthritis    Asthma    Atrial fibrillation ([place])    COVID-19 08/05/2022   Depression    H/O head injury    Hearing loss    Heart murmur    can be heard at times, Doctor said not to worry   History of kidney stones    History of stomach ulcers    Migraine    Pneumonia    Psittacosis    Stroke ([place])    Weakness of neck 03/02/2013    Past Surgical History:  Procedure Laterality Date   ANKLE FRACTURE SURGERY Left    APPENDECTOMY     BREAST LUMPECTOMY     CESAREAN SECTION     x 3   I & D KNEE WITH POLY EXCHANGE Left 08/16/2022   Procedure: KNEE POLY EXCHANGE;  Surgeon: Hiram Gash, MD;  Location: [facility];  Service: Orthopedics;  Laterality: Left;   KNEE ARTHROSCOPY WITH PATELLAR TENDON REPAIR Left 08/16/2022   Procedure: IRRIGATION AND DEBRIDEMENT LEFT KNEE REVISION WITH PATELLAR TENDON REPAIR and poly exchange;  Surgeon: Hiram Gash, MD;  Location: [facility];  Service: Orthopedics;  Laterality: Left;   PARTIAL HYSTERECTOMY     ROTATOR CUFF REPAIR Right    TOTAL KNEE ARTHROPLASTY Left 07/22/2022   Procedure: TOTAL KNEE ARTHROPLASTY;  Surgeon: [name], MD;  Location: [facility];  Service: Orthopedics;  Laterality: Left;    Social History   Socioeconomic History   Marital status: Married    Spouse name: Not on file   Number of children: 3   Years of education: Not on file   Highest education level: Not on file  Occupational History   Occupation: retired  Tobacco Use   Smoking status: Never   Smokeless tobacco: Never  Vaping Use   Vaping Use: Never used  Substance and Sexual Activity   Alcohol use: Yes    Comment: occ beer   Drug use: No   Sexual activity: Yes    Birth control/protection: Surgical    Comment: hyst  Other Topics Concern   Not on file  Social History Narrative   Not on file   Social Determinants of Health   Financial Resource Strain: Medium Risk (01/30/2021)   Overall Financial Resource Strain (CARDIA)    Difficulty of Paying Living Expenses: Somewhat hard  Food Insecurity: No Food Insecurity (08/15/2022)   Hunger Vital Sign    Worried About Running Out of Food in the Last Year: Never true    Ran Out of Food in the Last Year: Never true  Transportation Needs: No Transportation Needs (08/15/2022)   PRAPARE - Hydrologist (Medical): No    Lack of Transportation (Non-Medical): No  Physical Activity: Insufficiently Active (01/30/2021)   Exercise Vital Sign    Days of Exercise per Week: 2 days    Minutes of Exercise per Session: 10 min  Stress: No Stress Concern Present (01/30/2021)   [place]    Feeling of Stress : Only a little  Social Connections: Moderately Integrated (01/30/2021)   Social Connection and Isolation Panel [NHANES]    Frequency of Communication with Friends and Family: Twice a week    Frequency of Social Gatherings with Friends and Family: Twice a week    Attends Religious Services: More than 4 times per year    Active Member of [employer] or Organizations: No    Attends Archivist Meetings: Never    Marital  Status: Married  Human resources officer Violence: Not At Risk (08/15/2022)   Humiliation, Afraid, Rape, and Kick questionnaire    Fear of Current or Ex-Partner: No    Emotionally Abused: No    Physically Abused: No    Sexually Abused: No   Family History  Problem Relation Age of Onset   Cancer Father    Heart disease Brother    Alcohol abuse Brother    Depression Maternal Grandmother    Depression Grandchild    Breast cancer Daughter       VITAL SIGNS BP 129/85   Pulse 90   Temp (!) 97.5 F (36.4 C)   Resp 20   Ht '5\' 5"'$  (1.651 m)   Wt 180 lb 9.6 oz (81.9 kg)   SpO2 98%   BMI 30.05 kg/m   Outpatient Encounter Medications as of 10/10/2022  Medication Sig Note   acetaminophen (TYLENOL) 500 MG tablet Take 1,000 mg by mouth every 8 (eight) hours as needed for headache or mild pain.    apixaban (ELIQUIS) 5 MG TABS tablet Take 1 tablet (5 mg total) by mouth 2 (two) times daily.    ARIPiprazole (ABILIFY) 5 MG tablet Take 5 mg by mouth daily.    busPIRone (BUSPAR) 30 MG tablet Take 1 tablet (30 mg total) by mouth 2 (two) times daily.    Cholecalciferol (VITAMIN D) 125 MCG (5000 UT) CAPS Take 5,000 Units by mouth daily.    donepezil (ARICEPT) 5 MG tablet Take 5 mg by mouth at bedtime.    DULoxetine (CYMBALTA) 60 MG capsule Take 1 capsule (60 mg total) by mouth 2 (two) times daily.    eszopiclone (LUNESTA) 1 MG TABS tablet Take 1 tablet (1 mg total) by mouth at bedtime as needed for sleep. Take immediately before bedtime (Patient taking differently: Take 1 mg by mouth at bedtime. Take immediately before bedtime) 10/10/2022: Being given every night at bedtime at the nursing home.   LORazepam (ATIVAN) 0.5 MG tablet Take 0.5 tablets (0.25 mg total) by mouth 2 (two) times daily as needed for anxiety.    LORazepam (ATIVAN) 0.5 MG tablet Take 0.5 tablets (0.25 mg total) by mouth at bedtime.    metoprolol tartrate (LOPRESSOR) 25 MG tablet Take 1 tablet (25 mg total) by mouth 2 (two) times daily.     modafinil (PROVIGIL) 200 MG tablet Take 1 tablet (200 mg total) by mouth daily.    MYRBETRIQ 50 MG TB24 tablet Take 50 mg by mouth daily.    Nutritional Supplements (ENSURE ENLIVE PO) Take 1  Can by mouth 2 (two) times daily between meals.    polyethylene glycol (MIRALAX / GLYCOLAX) 17 g packet Take 17 g by mouth daily as needed. (Patient taking differently: Take 17 g by mouth daily as needed for mild constipation.)    senna (SENOKOT) 8.6 MG TABS tablet Take 1 tablet (8.6 mg total) by mouth 2 (two) times daily.    SUMAtriptan (IMITREX) 100 MG tablet Take 100 mg by mouth every 2 (two) hours as needed for migraine or headache. May repeat in 2 hours if headache persists or recurs.    ZINC OXIDE EX Apply 1 Application topically See admin instructions. every shift and prn Special Instructions: Apply to sacrum, bilateral buttocks and coccyx q shift for redness.    No facility-administered encounter medications on file as of 10/10/2022.     SIGNIFICANT DIAGNOSTIC EXAMS   PREVIOUS   08-16-22: chest x-ray: 1. No acute intrathoracic process.   08-21-22: ct of head:  1. Possible sulcal effacement and loss of gray-white differentiation in the posterior right frontal lobe may represent subacute infarct versus artifact. Consider further evaluation with MRI. 2. Mild chronic small vessel ischemia.  08-22-22: MRI head:  1. No acute intracranial abnormality. 2. Findings of chronic small vessel ischemia and volume loss.  NO NEW EXAMS   LABS REVIEWED PREVIOUS   08-04-22: wbc 6.9; hgb 10.1; hct 31.8; mcv 97.5 plt 371; glucose 93; bun 15; creat 0.66; k+ 4.0; na++ 135; ca 8.8; gfr >60 mag 1.8; tsh 1.460; free t3: 1.8; free t4: 1.02 08-14-22: wbc 9.3; hgb 10.5; hct 31.8 mcv 94.9 plt 324; glucose 93; bun 15 creat 0.66; k+ 3.6; na++ 138; ca 9.1; gfr >60 08-23-22: wbc 9.1; hgb 10.2; hct 31.8; mcv 94.9 plt 379'; glucose 100; bun 12; creat 0.61; k+ 4.3; na++ 139; ca 9.0; gfr >60  NO NEW LABS   Review of  Systems  Constitutional:  Negative for malaise/fatigue.  Respiratory:  Negative for cough and shortness of breath.   Cardiovascular:  Negative for chest pain, palpitations and leg swelling.  Gastrointestinal:  Negative for abdominal pain, constipation and heartburn.  Musculoskeletal:  Negative for back pain, joint pain and myalgias.  Skin: Negative.   Neurological:  Negative for dizziness.  Psychiatric/Behavioral:  The patient is not nervous/anxious.    Physical Exam Constitutional:      General: She is not in acute distress.    Appearance: She is well-developed. She is not diaphoretic.  Neck:     Thyroid: No thyromegaly.  Cardiovascular:     Rate and Rhythm: Normal rate and regular rhythm.     Heart sounds: Normal heart sounds.  Pulmonary:     Effort: Pulmonary effort is normal. No respiratory distress.     Breath sounds: Normal breath sounds.  Abdominal:     General: Bowel sounds are normal. There is no distension.     Palpations: Abdomen is soft.     Tenderness: There is no abdominal tenderness.  Musculoskeletal:        General: Normal range of motion.     Cervical back: Neck supple.     Right lower leg: Edema present.     Left lower leg: Edema present.  Lymphadenopathy:     Cervical: No cervical adenopathy.  Skin:    General: Skin is warm and dry.  Neurological:     Mental Status: She is alert. Mental status is at baseline.     Comments: She has abnormal tongue movement and chin movements that are significant  in nature  Psychiatric:        Mood and Affect: Mood normal.       ASSESSMENT/ PLAN:  TODAY  Bilateral lower extremity edema Tardive dyskinesia  Will begin lasix 20 mg daily for her edema Will begin ingrezza 40 mg daily for her TD.  Will check bmp in one week.    Ok Edwards NP [facility]  call [phone]

## 2022-10-10 NOTE — Patient Instructions (Signed)
FLORENE BRILL  10/10/2022     '@PREFPERIOPPHARMACY'$ @   Your procedure is scheduled on  10/15/2022.   Report to [facility] at  [place]  A.M.   Call this number if you have problems the morning of surgery:  [phone]  If you experience any cold or flu symptoms such as cough, fever, chills, shortness of breath, etc. between now and your scheduled surgery, please notify us at the above number.   Remember:  Do not eat or drink after midnight.        DO NOT miss any doses of eliquis before your procedure.     Take these medicines the morning of surgery with A SIP OF WATER      abilify, buspar, cymbalta, lorazapam, imitrex(if needed).     Do not wear jewelry, make-up or nail polish.  Do not wear lotions, powders, or perfumes, or deodorant.  Do not shave 48 hours prior to surgery.  Men may shave face and neck.  Do not bring valuables to the hospital.  [facility] is not responsible for any belongings or valuables.  Contacts, dentures or bridgework may not be worn into surgery.  Leave your suitcase in the car.  After surgery it may be brought to your room.  For patients admitted to the hospital, discharge time will be determined by your treatment team.  Patients discharged the day of surgery will not be allowed to drive home and must have someone with them for 24 hours.    Special instructions:   DO NOT smoke tobacco or vape for 24 hours before your procedure.  Please read over the following fact sheets that you were given. Anesthesia Post-op Instructions and Care and Recovery After Surgery      Electrical Cardioversion Electrical cardioversion is the delivery of a jolt of electricity to restore a normal rhythm to the heart. A rhythm that is too fast or is not regular keeps the heart from pumping well. There are two kinds of cardioversion. Pharmacologic (chemical) cardioversion is when your health care provider gives you one or more medicines to bring back your  regular heartbeat. The second way to restore regular rhythms is by sending an electrical shock to the heart. This is called electrical cardioversion. Electrical cardioversion is done as a scheduled procedure for irregular or fast heart rhythms that are not immediately life-threatening. Electrical cardioversion may also be done in an emergency for sudden life-threatening arrhythmias. Tell a health care provider about: Any allergies you have. All medicines you are taking, including vitamins, herbs, eye drops, creams, and over-the-counter medicines. Any problems you or family members have had with anesthetic medicines. Any bleeding problems you have. Any surgeries you have had, including a pacemaker, defibrillator, or other implanted device. Any medical conditions you have. Whether you are pregnant or may be pregnant. What are the risks? Your health care provider will talk with you about risks. These include: Allergic reactions to medicines. Irritation to the skin on your chest or back where the electrodes were applied during electrical cardioversion. A blood clot that breaks free and travels to other parts of your body, such as your brain. The possible return of a worse abnormal heart rhythm that will need to be treated with medicines, a pacemaker, or an implantable cardioverter defibrillator (ICD). What happens before the procedure? Medicines Your health care provider may have you start taking: Blood-thinning medicines (anticoagulants) so your blood does not clot as easily. If your doctor  gives you this medicine, you may need to take it for 3 to 4 weeks before the procedure. Medicines to help stabilize your heart rate and rhythm. Ask your health care provider about: Changing or stopping your regular medicines. These include any diabetes medicines or blood thinners you take. Taking medicines such as aspirin and ibuprofen. These medicines can thin your blood. Do not take them unless your health  care provider tells you to. Taking over-the-counter medicines, vitamins, herbs, and supplements. General instructions Follow instructions from your health care provider about what you may eat and drink. Do not put any lotions, powders, or ointments on your chest and back for 24 hours before the procedure. They can cause problems with the paddles used to deliver electricity to your heart. Do not wear jewelry as this can interfere with delivering electricity to your heart. If you will be going home right after the procedure, plan to have a responsible adult: Take you home from the hospital or clinic. You will not be allowed to drive. Care for you for the time you are told. Tests You may have an exam or testing. A transesophageal echocardiogram (TEE) may be performed if necessary (or preferred) to rule out left atrial blood clot. What happens during the procedure?     An IV will be inserted into one of your veins. Sticky patches (electrodes) or metal paddles may be placed on your chest. The electrodes or paddles will be placed in one of these ways: One on your right chest, the other on the left ribs. One may be placed on your chest and the other on your back. Or both can be placed on your chest. You may be given a sedative. This helps you relax. An electrical shock will be delivered. The shock briefly stops (resets) your heart rhythm. Your health care provider will check to see if your heartbeat is regular. Some people need only one shock. Some need more to restore a regular heartbeat. The procedure may vary among health care providers and hospitals. What happens after the procedure? Your blood pressure, heart rate, breathing rate, and blood oxygen level will be monitored until you leave the hospital or clinic. Your heart rhythm will be watched to make sure it does not change. Summary Electrical cardioversion is the delivery of a jolt of electricity to restore a normal rhythm to the  heart. This procedure may be done as a scheduled procedure if the condition is not an emergency. You may have irritation to the skin on your chest or back where the electrodes were applied during the procedure. Your health care provider will check to see if your heartbeat is regular. Some people need only one shock. Some need more to restore a regular heartbeat. Your blood pressure, heart rate, breathing rate, and blood oxygen level will be monitored until you leave the hospital or clinic. This information is not intended to replace advice given to you by your health care provider. Make sure you discuss any questions you have with your health care provider. Document Revised: 02/19/2022 Document Reviewed: 02/21/2022 Elsevier Patient Education  [place]. Transesophageal Echocardiogram Transesophageal echocardiogram (TEE) is a test that uses sound waves to take pictures of your heart. TEE is done by passing a small probe attached to a flexible tube down the part of the body that moves food from your mouth to your stomach (esophagus). The pictures give clear images of your heart. This can help your doctor see if there are problems with your heart.  Tell a doctor about: Any allergies you have. All medicines you are taking. This includes vitamins, herbs, eye drops, creams, and over-the-counter medicines. Any problems you or family members have had with anesthetic medicines. Any blood disorders you have. Any surgeries you have had. Any medical conditions you have. Any swallowing problems. Whether you have or have had a blockage in the part of the body that moves food from your mouth to your stomach. Whether you are pregnant or may be pregnant. What are the risks? In general, this is a safe procedure. But, problems may occur, such as: Damage to nearby structures or organs. A tear in the part of the body that moves food from your mouth to your stomach. Irregular heartbeat. Hoarse voice or  trouble swallowing. Bleeding. What happens before the procedure? Medicines Ask your doctor about changing or stopping: Your normal medicines. Vitamins, herbs, and supplements. Over-the-counter medicines. Do not take aspirin or ibuprofen unless you are told to. General instructions Follow instructions from your doctor about what you cannot eat or drink. You will take out any dentures or dental retainers. Plan to have a responsible adult take you home from the hospital or clinic. Plan to have a responsible adult care for you for the time you are told after you leave the hospital or clinic. This is important. What happens during the procedure?  An IV will be put into one of your veins. You may be given: A sedative. This medicine helps you relax. A medicine to numb the back of your throat. This may be sprayed or gargled. Your blood pressure, heart rate, and breathing will be watched. You may be asked to lie on your left side. A bite block will be placed in your mouth. This keeps you from biting the tube. The tip of the probe will be placed into the back of your mouth. You will be asked to swallow. Your doctor will take pictures of your heart. The probe and bite block will be taken out after the test is done. The procedure may vary among doctors and hospitals. What can I expect after the procedure? You will be monitored until you leave the hospital or clinic. This includes checking your blood pressure, heart rate, breathing rate, and blood oxygen level. Your throat may feel sore and numb. This will get better over time. You will not be allowed to eat or drink until the numbness has gone away. It is common to have a sore throat for a day or two. It is up to you to get the results of your procedure. Ask how to get your results when they are ready. Follow these instructions at home: If you were given a sedative during your procedure, do not drive or use machines until your doctor says that  it is safe. Return to your normal activities when your doctor says that it is safe. Keep all follow-up visits. Summary TEE is a test that uses sound waves to take pictures of your heart. You will be given a medicine to help you relax. Do not drive or use machines until your doctor says that it is safe. This information is not intended to replace advice given to you by your health care provider. Make sure you discuss any questions you have with your health care provider. Document Revised: 07/11/2021 Document Reviewed: 06/20/2020 Elsevier Patient Education  [place] After The following information offers guidance on how to care for yourself after your procedure. Your health care provider  may also give you more specific instructions. If you have problems or questions, contact your health care provider. What can I expect after the procedure? After the procedure, it is common to have: Tiredness. Little or no memory about what happened during or after the procedure. Impaired judgment when it comes to making decisions. Nausea or vomiting. Some trouble with balance. Follow these instructions at home: For the time period you were told by your health care provider:  Rest. Do not participate in activities where you could fall or become injured. Do not drive or use machinery. Do not drink alcohol. Do not take sleeping pills or medicines that cause drowsiness. Do not make important decisions or sign legal documents. Do not take care of children on your own. Medicines Take over-the-counter and prescription medicines only as told by your health care provider. If you were prescribed antibiotics, take them as told by your health care provider. Do not stop using the antibiotic even if you start to feel better. Eating and drinking Follow instructions from your health care provider about what you may eat and drink. Drink enough fluid to keep your urine pale  yellow. If you vomit: Drink clear fluids slowly and in small amounts as you are able. Clear fluids include water, ice chips, low-calorie sports drinks, and fruit juice that has water added to it (diluted fruit juice). Eat light and bland foods in small amounts as you are able. These foods include bananas, applesauce, rice, lean meats, toast, and crackers. General instructions  Have a responsible adult stay with you for the time you are told. It is important to have someone help care for you until you are awake and alert. If you have sleep apnea, surgery and some medicines can increase your risk for breathing problems. Follow instructions from your health care provider about wearing your sleep device: When you are sleeping. This includes during daytime naps. While taking prescription pain medicines, sleeping medicines, or medicines that make you drowsy. Do not use any products that contain nicotine or tobacco. These products include cigarettes, chewing tobacco, and vaping devices, such as e-cigarettes. If you need help quitting, ask your health care provider. Contact a health care provider if: You feel nauseous or vomit every time you eat or drink. You feel light-headed. You are still sleepy or having trouble with balance after 24 hours. You get a rash. You have a fever. You have redness or swelling around the IV site. Get help right away if: You have trouble breathing. You have new confusion after you get home. These symptoms may be an emergency. Get help right away. Call 911. Do not wait to see if the symptoms will go away. Do not drive yourself to the hospital. This information is not intended to replace advice given to you by your health care provider. Make sure you discuss any questions you have with your health care provider. Document Revised: 03/25/2022 Document Reviewed: 03/25/2022 Elsevier Patient Education  [place].

## 2022-10-10 NOTE — Pre-Procedure Instructions (Signed)
Dr Branch messaged for orders. 

## 2022-10-11 ENCOUNTER — Encounter (HOSPITAL_COMMUNITY): Payer: Self-pay

## 2022-10-11 ENCOUNTER — Encounter (HOSPITAL_COMMUNITY)
Admission: RE | Admit: 2022-10-11 | Discharge: 2022-10-11 | Disposition: A | Discharge status: Home / Self Care | Payer: PPO | Source: Ambulatory Visit | Attending: Cardiology | Admitting: Cardiology

## 2022-10-11 VITALS — BP 107/67 | HR 80 | Temp 97.7°F | Resp 18 | Ht 65.0 in | Wt 178.0 lb

## 2022-10-11 DIAGNOSIS — I639 Cerebral infarction, unspecified: Secondary | ICD-10-CM | POA: Diagnosis not present

## 2022-10-11 DIAGNOSIS — R262 Difficulty in walking, not elsewhere classified: Secondary | ICD-10-CM | POA: Diagnosis not present

## 2022-10-11 DIAGNOSIS — M159 Polyosteoarthritis, unspecified: Secondary | ICD-10-CM | POA: Diagnosis not present

## 2022-10-11 DIAGNOSIS — R278 Other lack of coordination: Secondary | ICD-10-CM | POA: Diagnosis not present

## 2022-10-11 DIAGNOSIS — Z01818 Encounter for other preprocedural examination: Secondary | ICD-10-CM

## 2022-10-11 DIAGNOSIS — M9712XD Periprosthetic fracture around internal prosthetic left knee joint, subsequent encounter: Secondary | ICD-10-CM | POA: Diagnosis not present

## 2022-10-11 DIAGNOSIS — Z01812 Encounter for preprocedural laboratory examination: Secondary | ICD-10-CM | POA: Insufficient documentation

## 2022-10-11 DIAGNOSIS — M6281 Muscle weakness (generalized): Secondary | ICD-10-CM | POA: Diagnosis not present

## 2022-10-11 DIAGNOSIS — Z741 Need for assistance with personal care: Secondary | ICD-10-CM | POA: Diagnosis not present

## 2022-10-11 LAB — BASIC METABOLIC PANEL
Anion gap: 9 (ref 5–15)
BUN: 16 mg/dL (ref 8–23)
CO2: 29 mmol/L (ref 22–32)
Calcium: 9.4 mg/dL (ref 8.9–10.3)
Chloride: 101 mmol/L (ref 98–111)
Creatinine, Ser: 0.59 mg/dL (ref 0.44–1.00)
GFR, Estimated: >60 mL/min (ref >60)
Glucose, Bld: 87 mg/dL (ref 70–99)
Potassium: 3.7 mmol/L (ref 3.5–5.1)
Sodium: 139 mmol/L (ref 135–145)

## 2022-10-14 ENCOUNTER — Encounter (HOSPITAL_COMMUNITY): Payer: Self-pay | Admitting: Anesthesiology

## 2022-10-14 ENCOUNTER — Other Ambulatory Visit: Payer: Self-pay | Admitting: Adult Health

## 2022-10-14 ENCOUNTER — Telehealth: Payer: Self-pay | Admitting: Internal Medicine

## 2022-10-14 NOTE — Telephone Encounter (Signed)
Daughter is calling in to see if the procedure for is necessary. And if it is daughter want to know what anesthesia is being used. Concern for the patient to have a horrible reaction to the medication. Please advise

## 2022-10-15 ENCOUNTER — Encounter (HOSPITAL_COMMUNITY): Admission: RE | Payer: Self-pay | Source: Home / Self Care

## 2022-10-15 ENCOUNTER — Ambulatory Visit (HOSPITAL_COMMUNITY): Admission: RE | Admit: 2022-10-15 | Payer: PPO | Source: Home / Self Care | Admitting: Cardiology

## 2022-10-15 DIAGNOSIS — R600 Localized edema: Secondary | ICD-10-CM | POA: Insufficient documentation

## 2022-10-15 DIAGNOSIS — I4891 Unspecified atrial fibrillation: Secondary | ICD-10-CM

## 2022-10-15 DIAGNOSIS — G2401 Drug induced subacute dyskinesia: Secondary | ICD-10-CM | POA: Insufficient documentation

## 2022-10-15 SURGERY — CARDIOVERSION
Anesthesia: Monitor Anesthesia Care

## 2022-10-15 NOTE — Telephone Encounter (Signed)
Spoke to Dr. Dellia Cloud who recommends canceling cardioversion d/t daughter stating pt had neurological testing done in [facility] that possibly show's Alzheimer's. Pt has appt with Bernerd Pho, PA-C on Friday 10/18/22 @ 1pm to discuss medication management of A Fib w/ RVR.

## 2022-10-15 NOTE — Telephone Encounter (Signed)
Pt's daughter stated she was concerned with they type of anesthesia being used. Pt had some cognitive issues with generalized anesthesia.

## 2022-10-17 ENCOUNTER — Other Ambulatory Visit (HOSPITAL_COMMUNITY)
Admission: RE | Admit: 2022-10-17 | Discharge: 2022-10-17 | Disposition: A | Discharge status: Home / Self Care | Payer: PPO | Source: Skilled Nursing Facility | Attending: Adult Health | Admitting: Adult Health

## 2022-10-17 DIAGNOSIS — I87303 Chronic venous hypertension (idiopathic) without complications of bilateral lower extremity: Secondary | ICD-10-CM | POA: Insufficient documentation

## 2022-10-17 LAB — BASIC METABOLIC PANEL
Anion gap: 9 (ref 5–15)
BUN: 17 mg/dL (ref 8–23)
CO2: 30 mmol/L (ref 22–32)
Calcium: 9.4 mg/dL (ref 8.9–10.3)
Chloride: 99 mmol/L (ref 98–111)
Creatinine, Ser: 0.56 mg/dL (ref 0.44–1.00)
GFR, Estimated: >60 mL/min (ref >60)
Glucose, Bld: 98 mg/dL (ref 70–99)
Potassium: 4.1 mmol/L (ref 3.5–5.1)
Sodium: 138 mmol/L (ref 135–145)

## 2022-10-17 NOTE — Progress Notes (Signed)
Cardiology Office Note    Date:  10/18/2022   ID:  [name], DOB 1946-04-23, MRN [id number]  PCP:  Asencion Noble, MD  Cardiologist: Chalmers Guest, MD    Chief Complaint  Patient presents with   Follow-up    Atrial Fibrillation    History of Present Illness:    [name] is a 76 y.o. female with past medical history of paroxysmal atrial fibrillation, HLD, GERD, history of DVT, anxiety, depression and history of CVA who presents to the office today to discuss options in regards to her atrial fibrillation.  She was hospitalized in 08/2022 for revision of a left TKA and was found to be in more persistent atrial fibrillation at that time. She was restarted on Eliquis with consideration of an outpatient DCCV if she remained in atrial fibrillation. She did see Dr. Dellia Cloud on 09/24/2022 and was still in atrial fibrillation with RVR with heart rate in 130's. Eliquis had been held for a procedure, therefore was recommended to schedule a TEE-guided DCCV on 10/15/2022. Was recommended after cardioversion to start Amiodarone.  The patient's daughter called the office on 10/15/2022 reporting she had recently been diagnosed with Alzheimer's dementia at [facility]. Therefore, her procedure was canceled and a follow-up was arranged to discuss medication management of her atrial fibrillation.  In talking with the patient and her daughter today, she reports she is overall asymptomatic with her atrial fibrillation. She denies any recent chest pain or palpitations. No recent orthopnea, PND or dyspnea on exertion. She does experience intermittent lower extremity edema which is typically localized to her left leg following her recent surgery. She does not elevate her lower extremities during the day and mostly sits in a wheelchair while at [facility]. She is working with PT.  Her vitals have overall been well-controlled when checked at SNF with heart rate in the 70's to 90's. Remains on Eliquis for  anticoagulation with no reports of active bleeding.   Past Medical History:  Diagnosis Date   Anemia    Arthritis    Asthma    Atrial fibrillation ([place])    COVID-19 08/05/2022   Depression    H/O head injury    Hearing loss    Heart murmur    can be heard at times, Doctor said not to worry   History of kidney stones    History of stomach ulcers    Migraine    Pneumonia    Psittacosis    Stroke ([place])    Weakness of neck 03/02/2013    Past Surgical History:  Procedure Laterality Date   ANKLE FRACTURE SURGERY Left    APPENDECTOMY     BREAST LUMPECTOMY     CESAREAN SECTION     x 3   I & D KNEE WITH POLY EXCHANGE Left 08/16/2022   Procedure: KNEE POLY EXCHANGE;  Surgeon: Hiram Gash, MD;  Location: [place];  Service: Orthopedics;  Laterality: Left;   KNEE ARTHROSCOPY WITH PATELLAR TENDON REPAIR Left 08/16/2022   Procedure: IRRIGATION AND DEBRIDEMENT LEFT KNEE REVISION WITH PATELLAR TENDON REPAIR and poly exchange;  Surgeon: Hiram Gash, MD;  Location: [place];  Service: Orthopedics;  Laterality: Left;   PARTIAL HYSTERECTOMY     ROTATOR CUFF REPAIR Right    TOTAL KNEE ARTHROPLASTY Left 07/22/2022   Procedure: TOTAL KNEE ARTHROPLASTY;  Surgeon: [name], MD;  Location: [facility];  Service: Orthopedics;  Laterality: Left;    Current Medications: Outpatient Medications Prior  to Visit  Medication Sig Dispense Refill   acetaminophen (TYLENOL) 500 MG tablet Take 1,000 mg by mouth every 8 (eight) hours as needed for headache or mild pain.     apixaban (ELIQUIS) 5 MG TABS tablet Take 1 tablet (5 mg total) by mouth 2 (two) times daily. 60 tablet    ARIPiprazole (ABILIFY) 5 MG tablet Take 5 mg by mouth daily.     busPIRone (BUSPAR) 30 MG tablet Take 1 tablet (30 mg total) by mouth 2 (two) times daily. 180 tablet 0   Cholecalciferol (VITAMIN D) 125 MCG (5000 UT) CAPS Take 5,000 Units by mouth daily.     donepezil (ARICEPT) 5 MG tablet Take 5 mg by mouth at bedtime.      DULoxetine (CYMBALTA) 60 MG capsule Take 1 capsule (60 mg total) by mouth 2 (two) times daily. 180 capsule 1   eszopiclone (LUNESTA) 1 MG TABS tablet Take 1 tablet (1 mg total) by mouth at bedtime as needed for sleep. Take immediately before bedtime (Patient taking differently: Take 1 mg by mouth at bedtime. Take immediately before bedtime) 30 tablet 0   furosemide (LASIX) 20 MG tablet Take 20 mg by mouth.     LORazepam (ATIVAN) 0.5 MG tablet Take 0.5 tablets (0.25 mg total) by mouth 2 (two) times daily as needed for anxiety. 15 tablet 0   LORazepam (ATIVAN) 0.5 MG tablet Take 0.5 tablets (0.25 mg total) by mouth at bedtime. 15 tablet 0   metoprolol tartrate (LOPRESSOR) 25 MG tablet Take 1 tablet (25 mg total) by mouth 2 (two) times daily. 180 tablet 3   modafinil (PROVIGIL) 200 MG tablet Take 1 tablet (200 mg total) by mouth daily. 30 tablet 0   MYRBETRIQ 50 MG TB24 tablet Take 50 mg by mouth daily.     Nutritional Supplements (ENSURE ENLIVE PO) Take 1 Can by mouth 2 (two) times daily between meals.     polyethylene glycol (MIRALAX / GLYCOLAX) 17 g packet Take 17 g by mouth daily as needed. (Patient taking differently: Take 17 g by mouth daily as needed for mild constipation.) 14 each 0   senna (SENOKOT) 8.6 MG TABS tablet Take 1 tablet (8.6 mg total) by mouth 2 (two) times daily. 120 tablet 0   SUMAtriptan (IMITREX) 100 MG tablet Take 100 mg by mouth every 2 (two) hours as needed for migraine or headache. May repeat in 2 hours if headache persists or recurs.     valbenazine (INGREZZA) 40 MG capsule Take 40 mg by mouth daily.     ZINC OXIDE EX Apply 1 Application topically See admin instructions. every shift and prn Special Instructions: Apply to sacrum, bilateral buttocks and coccyx q shift for redness. (Patient not taking: Reported on 10/18/2022)     No facility-administered medications prior to visit.     Allergies:   Ticlid [ticlopidine], Nsaids, Prednisone, Monistat [miconazole], and Sulfa  antibiotics   Social History   Socioeconomic History   Marital status: Married    Spouse name: Not on file   Number of children: 3   Years of education: Not on file   Highest education level: Not on file  Occupational History   Occupation: retired  Tobacco Use   Smoking status: Never   Smokeless tobacco: Never  Vaping Use   Vaping Use: Never used  Substance and Sexual Activity   Alcohol use: Not Currently    Comment: occ beer   Drug use: No   Sexual activity: Yes  Birth control/protection: Surgical    Comment: hyst  Other Topics Concern   Not on file  Social History Narrative   Not on file   Social Determinants of Health   Financial Resource Strain: Medium Risk (01/30/2021)   Overall Financial Resource Strain (CARDIA)    Difficulty of Paying Living Expenses: Somewhat hard  Food Insecurity: No Food Insecurity (08/15/2022)   Hunger Vital Sign    Worried About Running Out of Food in the Last Year: Never true    Ran Out of Food in the Last Year: Never true  Transportation Needs: No Transportation Needs (08/15/2022)   PRAPARE - Hydrologist (Medical): No    Lack of Transportation (Non-Medical): No  Physical Activity: Insufficiently Active (01/30/2021)   Exercise Vital Sign    Days of Exercise per Week: 2 days    Minutes of Exercise per Session: 10 min  Stress: No Stress Concern Present (01/30/2021)   [place]    Feeling of Stress : Only a little  Social Connections: Moderately Integrated (01/30/2021)   Social Connection and Isolation Panel [NHANES]    Frequency of Communication with Friends and Family: Twice a week    Frequency of Social Gatherings with Friends and Family: Twice a week    Attends Religious Services: More than 4 times per year    Active Member of [employer] or Organizations: No    Attends Music therapist: Never    Marital Status: Married     Family  History:  The patient's family history includes Alcohol abuse in her brother; Breast cancer in her daughter; Cancer in her father; Depression in her grandchild and maternal grandmother; Heart disease in her brother.   Review of Systems:    Please see the history of present illness.     All other systems reviewed and are otherwise negative except as noted above.   Physical Exam:    VS:  BP 116/74   Pulse 84   SpO2 98%    General: Pleasant female appearing in no acute distress. Sitting in wheelchair.  Head: Normocephalic, atraumatic. Neck: No carotid bruits. JVD not elevated.  Lungs: Respirations regular and unlabored, without wheezes or rales.  Heart: Irregularly irregular. No S3 or S4.  No murmur, no rubs, or gallops appreciated. Abdomen: Appears non-distended. No obvious abdominal masses. Msk:  Strength and tone appear normal for age. Left knee brace in place.  Extremities: No clubbing or cyanosis. No edema.  Distal pedal pulses are 2+ bilaterally. Neuro: Alert and oriented X 3. Moves all extremities spontaneously. No focal deficits noted. Psych:  Responds to questions appropriately with a normal affect. Skin: No rashes or lesions noted  Wt Readings from Last 3 Encounters:  10/11/22 178 lb (80.7 kg)  10/10/22 180 lb 9.6 oz (81.9 kg)  09/17/22 184 lb 9.6 oz (83.7 kg)     Studies/Labs Reviewed:   EKG:  EKG is ordered today. The ekg ordered today demonstrates rate-controlled atrial fibrillation, HR 84 with known RBBB.   Recent Labs: 07/18/2022: ALT 15 08/04/2022: Magnesium 1.9; TSH 1.490 08/15/2022: B Natriuretic Peptide 154.2 08/23/2022: Hemoglobin 10.1; Platelets 379 10/17/2022: BUN 17; Creatinine, Ser 0.56; Potassium 4.1; Sodium 138   Lipid Panel No results found for: "CHOL", "TRIG", "HDL", "CHOLHDL", "VLDL", "LDLCALC", "LDLDIRECT"  Additional studies/ records that were reviewed today include:   Echocardiogram: 08/15/2022 IMPRESSIONS     1. Poor quality exam. Consider  repeat exam when HR is  better controlled.   2. Left ventricular ejection fraction, by estimation, is 55 to 60%. The  left ventricle has normal function. Left ventricular endocardial border  not optimally defined to evaluate regional wall motion. Left ventricular  diastolic parameters are  indeterminate. Definity contrast not used.   3. Right ventricular systolic function is normal. The right ventricular  size is normal. There is normal pulmonary artery systolic pressure.   4. Left atrial size was moderately dilated.   5. The mitral valve is grossly normal. Trivial mitral valve  regurgitation. No evidence of mitral stenosis.   6. The aortic valve was not well visualized. Aortic valve regurgitation  is not visualized. Aortic valve sclerosis is present, with no evidence of  aortic valve stenosis.   7. The inferior vena cava is normal in size with <50% respiratory  variability, suggesting right atrial pressure of 8 mmHg.    Assessment:    1. Persistent atrial fibrillation ([place])   2. Essential hypertension   3. History of DVT (deep vein thrombosis)   4. Lower extremity edema      Plan:   In order of problems listed above:  1. Persistent Atrial Fibrillation - As discussed above, a TEE/DCCV was previously scheduled but the patient and her family prefer conservative measures given her multiple medical issues. Given that her heart rate has significantly improved with dose adjustment of Lopressor, this is certainly reasonable given her asymptomatic state and well-controlled rates. Continue Lopressor 25 mg twice daily for rate control. - Continue Eliquis 5 mg twice daily for anticoagulation which is the appropriate dose at this time given her current age, weight and renal function.  2. HTN - BP is well-controlled at 116/74 during today's visit. Continue current medical therapy with Lopressor 25 mg twice daily.  3. History of DVT - Remains on Eliquis 5 mg twice daily for anticoagulation.  Hemoglobin was stable at 10.1 when checked in 08/2022.    4. Lower Extremity Edema - Suspect this is secondary to dependent edema as she mostly sits in a wheelchair throughout the day. Swelling is mostly isolated to her left leg and she has a knee brace in place which is likely exacerbating this. She was recently started on Lasix 20 mg daily and renal function and electrolytes were stable by most recent labs. I encouraged her to elevate her lower extremities when able and to limit sodium intake.   Medication Adjustments/Labs and Tests Ordered: Current medicines are reviewed at length with the patient today.  Concerns regarding medicines are outlined above.  Medication changes, Labs and Tests ordered today are listed in the Patient Instructions below. Patient Instructions  Medication Instructions:  Your physician recommends that you continue on your current medications as directed. Please refer to the Current Medication list given to you today.  *If you need a refill on your cardiac medications before your next appointment, please call your pharmacy*   Lab Work: NONE   If you have labs (blood work) drawn today and your tests are completely normal, you will receive your results only by: [place] (if you have MyChart) OR A paper copy in the mail If you have any lab test that is abnormal or we need to change your treatment, we will call you to review the results.   Testing/Procedures: NONE    Follow-Up: At [facility], you and your health needs are our priority.  As part of our continuing mission to provide you with exceptional heart care, we have created designated  Provider Care Teams.  These Care Teams include your primary Cardiologist (physician) and Advanced Practice Providers (APPs -  Physician Assistants and Nurse Practitioners) who all work together to provide you with the care you need, when you need it.  We recommend signing up for the patient portal called  "MyChart".  Sign up information is provided on this After Visit Summary.  MyChart is used to connect with patients for Virtual Visits (Telemedicine).  Patients are able to view lab/test results, encounter notes, upcoming appointments, etc.  Non-urgent messages can be sent to your provider as well.   To learn more about what you can do with MyChart, go to [email or web address].    Your next appointment:    February   The format for your next appointment:   In Person  Provider:   Claudina Lick, MD    Other Instructions Thank you for choosing [facility]!    Important Information About Sugar         Signed, Erma Heritage, PA-C  10/18/2022 4:18 PM    [place] [address] [place], [place] [address] Phone: [phone] Fax: [phone]

## 2022-10-18 ENCOUNTER — Ambulatory Visit: Payer: PPO | Attending: Student | Admitting: Student

## 2022-10-18 ENCOUNTER — Encounter: Payer: Self-pay | Admitting: Student

## 2022-10-18 VITALS — BP 116/74 | HR 84

## 2022-10-18 DIAGNOSIS — F332 Major depressive disorder, recurrent severe without psychotic features: Secondary | ICD-10-CM | POA: Diagnosis not present

## 2022-10-18 DIAGNOSIS — I10 Essential (primary) hypertension: Secondary | ICD-10-CM | POA: Diagnosis not present

## 2022-10-18 DIAGNOSIS — I4819 Other persistent atrial fibrillation: Secondary | ICD-10-CM | POA: Diagnosis not present

## 2022-10-18 DIAGNOSIS — R600 Localized edema: Secondary | ICD-10-CM

## 2022-10-18 DIAGNOSIS — Z86718 Personal history of other venous thrombosis and embolism: Secondary | ICD-10-CM

## 2022-10-18 DIAGNOSIS — F411 Generalized anxiety disorder: Secondary | ICD-10-CM | POA: Diagnosis not present

## 2022-10-18 NOTE — Patient Instructions (Signed)
Medication Instructions:  Your physician recommends that you continue on your current medications as directed. Please refer to the Current Medication list given to you today.  *If you need a refill on your cardiac medications before your next appointment, please call your pharmacy*   Lab Work: NONE   If you have labs (blood work) drawn today and your tests are completely normal, you will receive your results only by: [place] (if you have MyChart) OR A paper copy in the mail If you have any lab test that is abnormal or we need to change your treatment, we will call you to review the results.   Testing/Procedures: NONE    Follow-Up: At [facility], you and your health needs are our priority.  As part of our continuing mission to provide you with exceptional heart care, we have created designated Provider Care Teams.  These Care Teams include your primary Cardiologist (physician) and Advanced Practice Providers (APPs -  Physician Assistants and Nurse Practitioners) who all work together to provide you with the care you need, when you need it.  We recommend signing up for the patient portal called "MyChart".  Sign up information is provided on this After Visit Summary.  MyChart is used to connect with patients for Virtual Visits (Telemedicine).  Patients are able to view lab/test results, encounter notes, upcoming appointments, etc.  Non-urgent messages can be sent to your provider as well.   To learn more about what you can do with MyChart, go to [email or web address].    Your next appointment:    February   The format for your next appointment:   In Person  Provider:   Claudina Lick, MD    Other Instructions Thank you for choosing [facility]!    Important Information About Sugar

## 2022-10-30 ENCOUNTER — Other Ambulatory Visit: Payer: Self-pay | Admitting: Adult Health

## 2022-10-30 DIAGNOSIS — F332 Major depressive disorder, recurrent severe without psychotic features: Secondary | ICD-10-CM | POA: Diagnosis not present

## 2022-10-30 DIAGNOSIS — F411 Generalized anxiety disorder: Secondary | ICD-10-CM | POA: Diagnosis not present

## 2022-10-30 MED ORDER — ESZOPICLONE 1 MG PO TABS: 1.0000 mg | ORAL_TABLET | Freq: Every day | ORAL | 0 refills | Status: DC

## 2022-11-06 ENCOUNTER — Non-Acute Institutional Stay (SKILLED_NURSING_FACILITY): Payer: PPO | Admitting: Adult Health

## 2022-11-06 ENCOUNTER — Other Ambulatory Visit: Payer: Self-pay | Admitting: Adult Health

## 2022-11-06 ENCOUNTER — Encounter: Payer: Self-pay | Admitting: Adult Health

## 2022-11-06 DIAGNOSIS — F0150 Vascular dementia without behavioral disturbance: Secondary | ICD-10-CM | POA: Diagnosis not present

## 2022-11-06 DIAGNOSIS — D649 Anemia, unspecified: Secondary | ICD-10-CM

## 2022-11-06 DIAGNOSIS — R29818 Other symptoms and signs involving the nervous system: Secondary | ICD-10-CM

## 2022-11-06 DIAGNOSIS — F323 Major depressive disorder, single episode, severe with psychotic features: Secondary | ICD-10-CM

## 2022-11-06 DIAGNOSIS — I4811 Longstanding persistent atrial fibrillation: Secondary | ICD-10-CM

## 2022-11-06 DIAGNOSIS — K5909 Other constipation: Secondary | ICD-10-CM

## 2022-11-06 DIAGNOSIS — Z86718 Personal history of other venous thrombosis and embolism: Secondary | ICD-10-CM

## 2022-11-06 DIAGNOSIS — F5104 Psychophysiologic insomnia: Secondary | ICD-10-CM

## 2022-11-06 DIAGNOSIS — G894 Chronic pain syndrome: Secondary | ICD-10-CM

## 2022-11-06 DIAGNOSIS — F0390 Unspecified dementia without behavioral disturbance: Secondary | ICD-10-CM

## 2022-11-06 DIAGNOSIS — G43909 Migraine, unspecified, not intractable, without status migrainosus: Secondary | ICD-10-CM | POA: Diagnosis not present

## 2022-11-06 DIAGNOSIS — G2401 Drug induced subacute dyskinesia: Secondary | ICD-10-CM

## 2022-11-06 DIAGNOSIS — J45909 Unspecified asthma, uncomplicated: Secondary | ICD-10-CM

## 2022-11-06 DIAGNOSIS — R600 Localized edema: Secondary | ICD-10-CM

## 2022-11-06 DIAGNOSIS — M9712XS Periprosthetic fracture around internal prosthetic left knee joint, sequela: Secondary | ICD-10-CM

## 2022-11-06 DIAGNOSIS — R4189 Other symptoms and signs involving cognitive functions and awareness: Secondary | ICD-10-CM

## 2022-11-06 DIAGNOSIS — Z8673 Personal history of transient ischemic attack (TIA), and cerebral infarction without residual deficits: Secondary | ICD-10-CM

## 2022-11-06 NOTE — Progress Notes (Signed)
Location:  [facility] Room Number: [place] of Service:  SNF (31)   CODE STATUS: Full Code   Allergies  Allergen Reactions   Ticlid [Ticlopidine] Shortness Of Breath   Nsaids Other (See Comments)    Bleeding ulcer   Prednisone Other (See Comments)    Makes her stomach hurt--knows this isn't an allergy but doesn't want to take it    Monistat [Miconazole] Swelling and Rash   Sulfa Antibiotics Rash    Chief Complaint  Patient presents with   Medical Management of Chronic Issues                  Longstanding persistent atrial fibrillation/history of cva/history of dvt: Migraine without status migrainosus; non-intractable unspecified type:  Uncomplicated asthma unspecified asthma severity unspecified; unspecified whether persistent: Chronic constipation:    HPI:  She is a 76 year old long term resident of this facility being seen for the management of her chronic illnesses:  Longstanding persistent atrial fibrillation/history of cva/history of dvt: Migraine without status migrainosus; non-intractable unspecified type:  Uncomplicated asthma unspecified asthma severity unspecified; unspecified whether persistent: Chronic constipation. There are no reports of uncontrolled pain. She does have anxiety and delusional thought patterns. Her weight is stable.   Past Medical History:  Diagnosis Date   Anemia    Arthritis    Asthma    Atrial fibrillation ([place])    COVID-19 08/05/2022   Depression    H/O head injury    Hearing loss    Heart murmur    can be heard at times, Doctor said not to worry   History of kidney stones    History of stomach ulcers    Migraine    Pneumonia    Psittacosis    Stroke ([place])    Weakness of neck 03/02/2013    Past Surgical History:  Procedure Laterality Date   ANKLE FRACTURE SURGERY Left    APPENDECTOMY     BREAST LUMPECTOMY     CESAREAN SECTION     x 3   I & D KNEE WITH POLY EXCHANGE Left 08/16/2022   Procedure: KNEE  POLY EXCHANGE;  Surgeon: Hiram Gash, MD;  Location: [facility];  Service: Orthopedics;  Laterality: Left;   KNEE ARTHROSCOPY WITH PATELLAR TENDON REPAIR Left 08/16/2022   Procedure: IRRIGATION AND DEBRIDEMENT LEFT KNEE REVISION WITH PATELLAR TENDON REPAIR and poly exchange;  Surgeon: Hiram Gash, MD;  Location: [facility];  Service: Orthopedics;  Laterality: Left;   PARTIAL HYSTERECTOMY     ROTATOR CUFF REPAIR Right    TOTAL KNEE ARTHROPLASTY Left 07/22/2022   Procedure: TOTAL KNEE ARTHROPLASTY;  Surgeon: [name], MD;  Location: [facility];  Service: Orthopedics;  Laterality: Left;    Social History   Socioeconomic History   Marital status: Married    Spouse name: Not on file   Number of children: 3   Years of education: Not on file   Highest education level: Not on file  Occupational History   Occupation: retired  Tobacco Use   Smoking status: Never   Smokeless tobacco: Never  Vaping Use   Vaping Use: Never used  Substance and Sexual Activity   Alcohol use: Not Currently    Comment: occ beer   Drug use: No   Sexual activity: Yes    Birth control/protection: Surgical    Comment: hyst  Other Topics Concern   Not on file  Social History Narrative   Not on file  Social Determinants of Health   Financial Resource Strain: Medium Risk (01/30/2021)   Overall Financial Resource Strain (CARDIA)    Difficulty of Paying Living Expenses: Somewhat hard  Food Insecurity: No Food Insecurity (08/15/2022)   Hunger Vital Sign    Worried About Running Out of Food in the Last Year: Never true    Ran Out of Food in the Last Year: Never true  Transportation Needs: No Transportation Needs (08/15/2022)   PRAPARE - Hydrologist (Medical): No    Lack of Transportation (Non-Medical): No  Physical Activity: Insufficiently Active (01/30/2021)   Exercise Vital Sign    Days of Exercise per Week: 2 days    Minutes of Exercise per Session: 10 min  Stress: No Stress  Concern Present (01/30/2021)   [place]    Feeling of Stress : Only a little  Social Connections: Moderately Integrated (01/30/2021)   Social Connection and Isolation Panel [NHANES]    Frequency of Communication with Friends and Family: Twice a week    Frequency of Social Gatherings with Friends and Family: Twice a week    Attends Religious Services: More than 4 times per year    Active Member of [employer] or Organizations: No    Attends Archivist Meetings: Never    Marital Status: Married  Human resources officer Violence: Not At Risk (08/15/2022)   Humiliation, Afraid, Rape, and Kick questionnaire    Fear of Current or Ex-Partner: No    Emotionally Abused: No    Physically Abused: No    Sexually Abused: No   Family History  Problem Relation Age of Onset   Cancer Father    Heart disease Brother    Alcohol abuse Brother    Depression Maternal Grandmother    Depression Grandchild    Breast cancer Daughter       VITAL SIGNS BP 117/69   Pulse 68   Ht '5\' 5"'$  (1.651 m)   Wt 182 lb 12.8 oz (82.9 kg)   BMI 30.42 kg/m   Outpatient Encounter Medications as of 11/06/2022  Medication Sig   acetaminophen (TYLENOL) 500 MG tablet Take 1,000 mg by mouth every 8 (eight) hours as needed for headache or mild pain.   apixaban (ELIQUIS) 5 MG TABS tablet Take 1 tablet (5 mg total) by mouth 2 (two) times daily.   ARIPiprazole (ABILIFY) 5 MG tablet Take 5 mg by mouth daily.   busPIRone (BUSPAR) 30 MG tablet Take 1 tablet (30 mg total) by mouth 2 (two) times daily.   Cholecalciferol (VITAMIN D) 125 MCG (5000 UT) CAPS Take 5,000 Units by mouth daily.   donepezil (ARICEPT) 10 MG tablet Take 10 mg by mouth at bedtime.   DULoxetine (CYMBALTA) 60 MG capsule Take 1 capsule (60 mg total) by mouth 2 (two) times daily.   eszopiclone (LUNESTA) 1 MG TABS tablet Take 1 tablet (1 mg total) by mouth at bedtime. Take immediately before bedtime    furosemide (LASIX) 20 MG tablet Take 20 mg by mouth.   LORazepam (ATIVAN) 0.5 MG tablet Take 0.5 tablets (0.25 mg total) by mouth 2 (two) times daily as needed for anxiety.   LORazepam (ATIVAN) 0.5 MG tablet Take 0.5 tablets (0.25 mg total) by mouth at bedtime.   metoprolol tartrate (LOPRESSOR) 25 MG tablet Take 1 tablet (25 mg total) by mouth 2 (two) times daily.   modafinil (PROVIGIL) 200 MG tablet Take 1 tablet (200 mg total) by  mouth daily.   MYRBETRIQ 50 MG TB24 tablet Take 50 mg by mouth daily.   Nutritional Supplements (ENSURE ENLIVE PO) Take 1 Can by mouth 2 (two) times daily between meals.   polyethylene glycol (MIRALAX / GLYCOLAX) 17 g packet Take 17 g by mouth daily as needed.   senna (SENOKOT) 8.6 MG TABS tablet Take 1 tablet (8.6 mg total) by mouth 2 (two) times daily.   SUMAtriptan (IMITREX) 100 MG tablet Take 100 mg by mouth every 2 (two) hours as needed for migraine or headache. May repeat in 2 hours if headache persists or recurs.   UNABLE TO FIND Diet: Regular   valbenazine (INGREZZA) 40 MG capsule Take 40 mg by mouth daily.   ZINC OXIDE EX Apply 1 Application topically See admin instructions. every shift and prn Special Instructions: Apply to sacrum, bilateral buttocks and coccyx q shift for redness.   [DISCONTINUED] donepezil (ARICEPT) 5 MG tablet Take 5 mg by mouth at bedtime.   No facility-administered encounter medications on file as of 11/06/2022.     SIGNIFICANT DIAGNOSTIC EXAMS   PREVIOUS   08-16-22: chest x-ray: 1. No acute intrathoracic process.   08-21-22: ct of head:  1. Possible sulcal effacement and loss of gray-white differentiation in the posterior right frontal lobe may represent subacute infarct versus artifact. Consider further evaluation with MRI. 2. Mild chronic small vessel ischemia.  08-22-22: MRI head:  1. No acute intracranial abnormality. 2. Findings of chronic small vessel ischemia and volume loss.  NO NEW EXAMS   LABS REVIEWED  PREVIOUS   08-04-22: wbc 6.9; hgb 10.1; hct 31.8; mcv 97.5 plt 371; glucose 93; bun 15; creat 0.66; k+ 4.0; na++ 135; ca 8.8; gfr >60 mag 1.8; tsh 1.460; free t3: 1.8; free t4: 1.02 08-14-22: wbc 9.3; hgb 10.5; hct 31.8 mcv 94.9 plt 324; glucose 93; bun 15 creat 0.66; k+ 3.6; na++ 138; ca 9.1; gfr >60 08-23-22: wbc 9.1; hgb 10.2; hct 31.8; mcv 94.9 plt 379'; glucose 100; bun 12; creat 0.61; k+ 4.3; na++ 139; ca 9.0; gfr >60  TODAY  10-11-22: glucose 87; bun 16; creat 0.59; k+ 3.7; na++ 137; ca 9.4; gfr >60 10-17-22: glucose 98; bun 17; creat 0.56; k+ 4.1; na++ 138; ca 9.4; gfr >60  Review of Systems  Constitutional:  Negative for malaise/fatigue.  Respiratory:  Negative for cough and shortness of breath.   Cardiovascular:  Negative for chest pain, palpitations and leg swelling.  Gastrointestinal:  Negative for abdominal pain, constipation and heartburn.  Musculoskeletal:  Negative for back pain, joint pain and myalgias.  Skin: Negative.   Neurological:  Negative for dizziness.  Psychiatric/Behavioral:  The patient is not nervous/anxious.    Physical Exam Constitutional:      General: She is not in acute distress.    Appearance: She is well-developed. She is not diaphoretic.  Neck:     Thyroid: No thyromegaly.  Cardiovascular:     Rate and Rhythm: Normal rate and regular rhythm.     Pulses: Normal pulses.     Heart sounds: Normal heart sounds.  Pulmonary:     Effort: Pulmonary effort is normal. No respiratory distress.     Breath sounds: Normal breath sounds.  Abdominal:     General: Bowel sounds are normal. There is no distension.     Palpations: Abdomen is soft.     Tenderness: There is no abdominal tenderness.  Musculoskeletal:        General: Normal range of motion.  Cervical back: Neck supple.     Right lower leg: Edema present.     Left lower leg: Edema present.  Lymphadenopathy:     Cervical: No cervical adenopathy.  Skin:    General: Skin is warm and dry.   Neurological:     Mental Status: She is alert. Mental status is at baseline.     Comments: She has abnormal tongue movement and chin movements that are significant in nature   Psychiatric:        Mood and Affect: Mood normal.       ASSESSMENT/ PLAN:  TODAY  Longstanding persistent atrial fibrillation/history of cva/history of dvt: heart rate is stable will continue eliquis 5 mg twice daily take lopressor 25 mg twice daily for rate control   2.  Migraine without status migrainosus; non-intractable unspecified type: will continue imitrex 100 mg twice daily as needed   3. Uncomplicated asthma unspecified asthma severity unspecified; unspecified whether persistent: is stable  4. Chronic constipation: will continue senna twice daily and has miralax daily as needed  5. Vascular dementia without behavioral disturbance/neurocognitive deficit: weight is 182 pounds; will continue aricept 10 mg daily   6. Tardive dyskinesia: continues to have abnormal facial movements; will continue ingrezza 40 mg daily   7.  Chronic pain syndrome/periprosthetic fracture around internal prosthetic left knee joint sequela: has prn tylenol and cymbalta 60 mg twice daily    8.  Bilateral lower extremity edema: will monitor   9. Chronic anemia: hgb 10.2  10. Chronic insomnia: will continue lunesta 1 mg nightly   11. Psychosis in elderly / major depression with psychotic features; at times feels as though she is in the basement. Will continue abilify 5 mg daily; cymbalta 60 mg twice daily buspar 30 mg twice daily; has ativan 0.25 mg nightly and twice daily as needed   12. Somnolence: will continue provigil 200 mg daily   13. Protein calorie malnutrition will continue ensure twice daily   14. Urinary incontinence: will continue myrbetriq 50 mg daily  Will check Hep C; cologuard; dexa scan and screening mammogram     Ok Edwards NP [facility]  call [phone]

## 2022-11-07 DIAGNOSIS — F02818 Dementia in other diseases classified elsewhere, unspecified severity, with other behavioral disturbance: Secondary | ICD-10-CM | POA: Diagnosis not present

## 2022-11-07 DIAGNOSIS — F01518 Vascular dementia, unspecified severity, with other behavioral disturbance: Secondary | ICD-10-CM | POA: Diagnosis not present

## 2022-11-07 DIAGNOSIS — G309 Alzheimer's disease, unspecified: Secondary | ICD-10-CM | POA: Diagnosis not present

## 2022-11-08 ENCOUNTER — Encounter: Payer: Self-pay | Admitting: Adult Health

## 2022-11-08 ENCOUNTER — Non-Acute Institutional Stay (SKILLED_NURSING_FACILITY): Payer: PPO | Admitting: Adult Health

## 2022-11-08 ENCOUNTER — Telehealth: Payer: Self-pay | Admitting: Psychiatry

## 2022-11-08 DIAGNOSIS — Z8673 Personal history of transient ischemic attack (TIA), and cerebral infarction without residual deficits: Secondary | ICD-10-CM

## 2022-11-08 DIAGNOSIS — I4819 Other persistent atrial fibrillation: Secondary | ICD-10-CM

## 2022-11-08 DIAGNOSIS — F0150 Vascular dementia without behavioral disturbance: Secondary | ICD-10-CM

## 2022-11-08 NOTE — Telephone Encounter (Signed)
error 

## 2022-11-08 NOTE — Progress Notes (Signed)
Location:  [facility] Room Number: [address] Place of Service:  SNF (31)   CODE STATUS: Full Code  Allergies  Allergen Reactions   Ticlid [Ticlopidine] Shortness Of Breath   Nsaids Other (See Comments)    Bleeding ulcer   Prednisone Other (See Comments)    Makes her stomach hurt--knows this isn't an allergy but doesn't want to take it    Monistat [Miconazole] Swelling and Rash   Sulfa Antibiotics Rash    Chief Complaint  Patient presents with   Care Plan Meeting    HPI:  We have come together for her care plan meeting. Family present. She was seen by neurology in [place]. Thre were suggestions made in his progress note.  We have discussed her medication regimen. There were numerous changes suggested to include abilify; ativan; lunesta; trazodone and to start namenda. At this time she is unable to participate in therapy. She will be seen by orthopedics early next week; after that she would be able to participate in therapy. We have discussed her advanced directives her family was given a MOST form was given to her family to finish filling out.   Past Medical History:  Diagnosis Date   Anemia    Arthritis    Asthma    Atrial fibrillation ([place])    COVID-19 08/05/2022   Depression    H/O head injury    Hearing loss    Heart murmur    can be heard at times, Doctor said not to worry   History of kidney stones    History of stomach ulcers    Migraine    Pneumonia    Psittacosis    Stroke ([place])    Weakness of neck 03/02/2013    Past Surgical History:  Procedure Laterality Date   ANKLE FRACTURE SURGERY Left    APPENDECTOMY     BREAST LUMPECTOMY     CESAREAN SECTION     x 3   I & D KNEE WITH POLY EXCHANGE Left 08/16/2022   Procedure: KNEE POLY EXCHANGE;  Surgeon: Hiram Gash, MD;  Location: [facility];  Service: Orthopedics;  Laterality: Left;   KNEE ARTHROSCOPY WITH PATELLAR TENDON REPAIR Left 08/16/2022   Procedure: IRRIGATION AND DEBRIDEMENT  LEFT KNEE REVISION WITH PATELLAR TENDON REPAIR and poly exchange;  Surgeon: Hiram Gash, MD;  Location: [facility];  Service: Orthopedics;  Laterality: Left;   PARTIAL HYSTERECTOMY     ROTATOR CUFF REPAIR Right    TOTAL KNEE ARTHROPLASTY Left 07/22/2022   Procedure: TOTAL KNEE ARTHROPLASTY;  Surgeon: [name], MD;  Location[facility];  Service: Orthopedics;  Laterality: Left;    Social History   Socioeconomic History   Marital status: Married    Spouse name: Not on file   Number of children: 3   Years of education: Not on file   Highest education level: Not on file  Occupational History   Occupation: retired  Tobacco Use   Smoking status: Never   Smokeless tobacco: Never  Vaping Use   Vaping Use: Never used  Substance and Sexual Activity   Alcohol use: Not Currently    Comment: occ beer   Drug use: No   Sexual activity: Yes    Birth control/protection: Surgical    Comment: hyst  Other Topics Concern   Not on file  Social History Narrative   Not on file   Social Determinants of Health   Financial Resource Strain: Medium Risk (01/30/2021)  Overall Financial Resource Strain (CARDIA)    Difficulty of Paying Living Expenses: Somewhat hard  Food Insecurity: No Food Insecurity (08/15/2022)   Hunger Vital Sign    Worried About Running Out of Food in the Last Year: Never true    Ran Out of Food in the Last Year: Never true  Transportation Needs: No Transportation Needs (08/15/2022)   PRAPARE - Hydrologist (Medical): No    Lack of Transportation (Non-Medical): No  Physical Activity: Insufficiently Active (01/30/2021)   Exercise Vital Sign    Days of Exercise per Week: 2 days    Minutes of Exercise per Session: 10 min  Stress: No Stress Concern Present (01/30/2021)   [place]    Feeling of Stress : Only a little  Social Connections: Moderately Integrated (01/30/2021)   Social  Connection and Isolation Panel [NHANES]    Frequency of Communication with Friends and Family: Twice a week    Frequency of Social Gatherings with Friends and Family: Twice a week    Attends Religious Services: More than 4 times per year    Active Member of [employer] or Organizations: No    Attends Archivist Meetings: Never    Marital Status: Married  Human resources officer Violence: Not At Risk (08/15/2022)   Humiliation, Afraid, Rape, and Kick questionnaire    Fear of Current or Ex-Partner: No    Emotionally Abused: No    Physically Abused: No    Sexually Abused: No   Family History  Problem Relation Age of Onset   Cancer Father    Heart disease Brother    Alcohol abuse Brother    Depression Maternal Grandmother    Depression Grandchild    Breast cancer Daughter       VITAL SIGNS BP 117/69   Pulse 68   Temp (!) 96.8 F (36 C)   Resp 20   Ht '5\' 5"'$  (1.651 m)   Wt 182 lb 12.8 oz (82.9 kg)   SpO2 96%   BMI 30.42 kg/m   Outpatient Encounter Medications as of 11/08/2022  Medication Sig   acetaminophen (TYLENOL) 500 MG tablet Take 1,000 mg by mouth every 8 (eight) hours as needed for headache or mild pain.   apixaban (ELIQUIS) 5 MG TABS tablet Take 1 tablet (5 mg total) by mouth 2 (two) times daily.   ARIPiprazole (ABILIFY) 5 MG tablet Take 5 mg by mouth daily.   busPIRone (BUSPAR) 30 MG tablet Take 1 tablet (30 mg total) by mouth 2 (two) times daily.   Cholecalciferol (VITAMIN D) 125 MCG (5000 UT) CAPS Take 5,000 Units by mouth daily.   donepezil (ARICEPT) 10 MG tablet Take 10 mg by mouth at bedtime.   DULoxetine (CYMBALTA) 60 MG capsule Take 1 capsule (60 mg total) by mouth 2 (two) times daily.   eszopiclone (LUNESTA) 1 MG TABS tablet Take 1 tablet (1 mg total) by mouth at bedtime. Take immediately before bedtime   furosemide (LASIX) 20 MG tablet Take 20 mg by mouth.   LORazepam (ATIVAN) 0.5 MG tablet Take 0.5 tablets (0.25 mg total) by mouth 2 (two) times daily as  needed for anxiety.   LORazepam (ATIVAN) 0.5 MG tablet Take 0.5 tablets (0.25 mg total) by mouth at bedtime.   metoprolol tartrate (LOPRESSOR) 25 MG tablet Take 1 tablet (25 mg total) by mouth 2 (two) times daily.   modafinil (PROVIGIL) 200 MG tablet Take 1 tablet (200 mg  total) by mouth daily.   MYRBETRIQ 50 MG TB24 tablet Take 50 mg by mouth daily.   Nutritional Supplements (ENSURE ENLIVE PO) Take 1 Can by mouth 2 (two) times daily between meals.   polyethylene glycol (MIRALAX / GLYCOLAX) 17 g packet Take 17 g by mouth daily as needed.   senna (SENOKOT) 8.6 MG TABS tablet Take 1 tablet (8.6 mg total) by mouth 2 (two) times daily.   SUMAtriptan (IMITREX) 100 MG tablet Take 100 mg by mouth every 2 (two) hours as needed for migraine or headache. May repeat in 2 hours if headache persists or recurs.   UNABLE TO FIND Diet: Regular   valbenazine (INGREZZA) 40 MG capsule Take 40 mg by mouth daily.   ZINC OXIDE EX Apply 1 Application topically See admin instructions. every shift and prn Special Instructions: Apply to sacrum, bilateral buttocks and coccyx q shift for redness.   No facility-administered encounter medications on file as of 11/08/2022.     SIGNIFICANT DIAGNOSTIC EXAMS  PREVIOUS   08-16-22: chest x-ray: 1. No acute intrathoracic process.   08-21-22: ct of head:  1. Possible sulcal effacement and loss of gray-white differentiation in the posterior right frontal lobe may represent subacute infarct versus artifact. Consider further evaluation with MRI. 2. Mild chronic small vessel ischemia.  08-22-22: MRI head:  1. No acute intracranial abnormality. 2. Findings of chronic small vessel ischemia and volume loss.  NO NEW EXAMS   LABS REVIEWED PREVIOUS   08-04-22: wbc 6.9; hgb 10.1; hct 31.8; mcv 97.5 plt 371; glucose 93; bun 15; creat 0.66; k+ 4.0; na++ 135; ca 8.8; gfr >60 mag 1.8; tsh 1.460; free t3: 1.8; free t4: 1.02 08-14-22: wbc 9.3; hgb 10.5; hct 31.8 mcv 94.9 plt 324;  glucose 93; bun 15 creat 0.66; k+ 3.6; na++ 138; ca 9.1; gfr >60 08-23-22: wbc 9.1; hgb 10.2; hct 31.8; mcv 94.9 plt 379'; glucose 100; bun 12; creat 0.61; k+ 4.3; na++ 139; ca 9.0; gfr >60  NO NEW LABS   Review of Systems  Constitutional:  Negative for malaise/fatigue.  Respiratory:  Negative for cough and shortness of breath.   Cardiovascular:  Negative for chest pain, palpitations and leg swelling.  Gastrointestinal:  Negative for abdominal pain, constipation and heartburn.  Musculoskeletal:  Negative for back pain, joint pain and myalgias.  Skin: Negative.   Neurological:  Negative for dizziness.  Psychiatric/Behavioral:  The patient is not nervous/anxious.    Physical Exam Constitutional:      General: She is not in acute distress.    Appearance: She is well-developed. She is not diaphoretic.  Neck:     Thyroid: No thyromegaly.  Cardiovascular:     Rate and Rhythm: Normal rate and regular rhythm.     Pulses: Normal pulses.     Heart sounds: Normal heart sounds.  Pulmonary:     Effort: Pulmonary effort is normal. No respiratory distress.     Breath sounds: Normal breath sounds.  Abdominal:     General: Bowel sounds are normal. There is no distension.     Palpations: Abdomen is soft.     Tenderness: There is no abdominal tenderness.  Musculoskeletal:     Cervical back: Neck supple.     Right lower leg: Edema present.     Left lower leg: Edema present.     Comments: Splint in place on leg   Lymphadenopathy:     Cervical: No cervical adenopathy.  Skin:    General: Skin is warm and dry.  Neurological:     Mental Status: She is alert. Mental status is at baseline.  Psychiatric:        Mood and Affect: Mood normal.      ASSESSMENT/ PLAN:  TODAY  History of CVA Vascular dementia without behavioral disturbance Persistent atrial fibrillation  Will make the following changes; Will stop prn ativan; and 0.25 mg every other night for 2 weeks then discontinue Change  abilify to 4 pm to help manage her evening hour anxiety. She has delusions of being in a basement  On 11-12-22 will begin trazodone 25 mg nightly Will reduce lunesta 1 mg to every other night for 2 weeks then discontinue Change time of aricept to the AM On 01-10-23: will begin namenda xr titration to 28 mg daily  Will need to change her cymbalta to one time a day.  Will continue to monitor her status Will continue current plan of care   Time spent with patient: 60 minutes: (20 minutes spent with MOST form) goals of care; medications; therapy.  MOST was was reviewed with family they will complete and return.    Ok Edwards NP [facility]   call [phone]

## 2022-11-08 NOTE — Telephone Encounter (Signed)
Returned call to pt's daughter. She reports that pt has been dx'd with Vascular Dementia and Alzheimer's disease. Pt was seen by neurologist yesterday. Pt is currently in SNF. Discussed past med trials and neurologist's recommendation to see geriatric psychiatrist. Discussed possible referrals. Advised daughter to contact office with any other questions or needs.

## 2022-11-08 NOTE — Telephone Encounter (Addendum)
[name] daughter called requesting RTC to talk about mothers meds. Just diagnosed with Alzheimer's and Neurologist may recommend  some meds changes. Call [phone].  On DPR.

## 2022-11-12 ENCOUNTER — Other Ambulatory Visit (HOSPITAL_COMMUNITY): Payer: Self-pay | Admitting: Adult Health

## 2022-11-12 DIAGNOSIS — F323 Major depressive disorder, single episode, severe with psychotic features: Secondary | ICD-10-CM | POA: Insufficient documentation

## 2022-11-12 DIAGNOSIS — Z1231 Encounter for screening mammogram for malignant neoplasm of breast: Secondary | ICD-10-CM

## 2022-11-13 ENCOUNTER — Encounter: Payer: Self-pay | Admitting: Adult Health

## 2022-11-13 DIAGNOSIS — F411 Generalized anxiety disorder: Secondary | ICD-10-CM | POA: Diagnosis not present

## 2022-11-13 DIAGNOSIS — F332 Major depressive disorder, recurrent severe without psychotic features: Secondary | ICD-10-CM | POA: Diagnosis not present

## 2022-11-14 ENCOUNTER — Other Ambulatory Visit (HOSPITAL_COMMUNITY)
Admission: RE | Admit: 2022-11-14 | Discharge: 2022-11-14 | Disposition: A | Discharge status: Home / Self Care | Payer: PPO | Source: Skilled Nursing Facility | Attending: Adult Health | Admitting: Adult Health

## 2022-11-14 DIAGNOSIS — B182 Chronic viral hepatitis C: Secondary | ICD-10-CM | POA: Insufficient documentation

## 2022-11-14 LAB — HEPATITIS C ANTIBODY: HCV Ab: NONREACTIVE

## 2022-11-15 ENCOUNTER — Non-Acute Institutional Stay (SKILLED_NURSING_FACILITY): Payer: PPO | Admitting: Internal Medicine

## 2022-11-15 ENCOUNTER — Encounter: Payer: Self-pay | Admitting: Internal Medicine

## 2022-11-15 DIAGNOSIS — D649 Anemia, unspecified: Secondary | ICD-10-CM

## 2022-11-15 DIAGNOSIS — M9712XS Periprosthetic fracture around internal prosthetic left knee joint, sequela: Secondary | ICD-10-CM

## 2022-11-15 DIAGNOSIS — I4811 Longstanding persistent atrial fibrillation: Secondary | ICD-10-CM

## 2022-11-15 DIAGNOSIS — M1712 Unilateral primary osteoarthritis, left knee: Secondary | ICD-10-CM | POA: Diagnosis not present

## 2022-11-15 DIAGNOSIS — F0150 Vascular dementia without behavioral disturbance: Secondary | ICD-10-CM | POA: Diagnosis not present

## 2022-11-15 NOTE — Assessment & Plan Note (Signed)
Serial improvement , H/H 10.1/30.8 on 08/23/22. No bleeding dyscrasias reported by staff.  Continue to monitor.

## 2022-11-15 NOTE — Progress Notes (Signed)
NURSING HOME LOCATION:  [facility] ROOM NUMBER:  23 W  CODE STATUS:  Full Code  PCP: Asencion Noble MD  This is a nursing facility follow up visit of chronic medical diagnoses & to document compliance with Regulation 483.30 (c) in The [place] Manual Phase 2 which mandates caregiver visit ( visits can alternate among physician, PA or NP as per statutes) within 10 days of 30 days / 60 days/ 90 days post admission to SNF date    Interim medical record and care since last SNF visit was updated with review of diagnostic studies and change in clinical status since last visit were documented.  HPI: She was rehospitalized 10/4 - 08/23/2022 for left knee fracture surgical intervention.  She had had a left TKA on the right on 07/22/2022 and was at the SNF for rehab when she sustained a fall fracturing the patella of the left knee.  This was in the context of lightheadedness and weakness due to COVID.  On 10/6 the left total knee arthroplasty was revised and tendon repair with graft performed by Dr. Zachery Dakins. Hospital course was complicated by A-fib with RVR.  Cardiology consulted and transitioned her from Cardizem drip to oral metoprolol.  Because of a CHA2DS2-VASc 2 score of 5 Eliquis prophylaxis was initiated. Dr. Zachery Dakins saw her on Friday 1/5; he felt that the fracture was healing well but she was very deconditioned.  He felt that the right heel cord construction necessitated PT and stretching.  She was to be WBAT without restrictions.  The brace was removed from the left lower extremity.  She was to follow-up on 12/17/2022. Dr. 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  Geriatric Psych consultation was recommended.  Medication regimentation was changed to include trazodone; but this was associated with hallucinations and was discontinued.  Review of systems: Initially she did not remember  having been seen on 1/5.  Subsequently she did state that the brace was removed and she was to start PT.  She states she is "fine" and denies any symptoms other than chronic constipation.  Constitutional: No fever, significant weight change, fatigue  Eyes: No redness, discharge, pain, vision change ENT/mouth: No nasal congestion,  purulent discharge, earache, change in hearing, sore throat  Cardiovascular: No chest pain, palpitations, paroxysmal nocturnal dyspnea, claudication, edema  Respiratory: No cough, sputum production, hemoptysis, DOE, significant snoring, apnea   Gastrointestinal: No heartburn, dysphagia, abdominal pain, nausea /vomiting, rectal bleeding, melena Genitourinary: No dysuria, hematuria, pyuria, incontinence, nocturia Musculoskeletal: No joint stiffness, joint swelling, weakness, pain Dermatologic: No rash, pruritus, change in appearance of skin Neurologic: No dizziness, headache, syncope, seizures, numbness, tingling Psychiatric: No significant anxiety, depression, insomnia, anorexia Endocrine: No change in hair/skin/nails, excessive thirst, excessive hunger, excessive urination  Hematologic/lymphatic: No significant bruising, lymphadenopathy, abnormal bleeding Allergy/immunology: No itchy/watery eyes, significant sneezing, urticaria, angioedema  Physical exam:  Pertinent or positive findings: as noted she initially had a memory deficit for the orthopedic postsurgical follow-up visit 1/5.  Eyebrows are decreased laterally.  She has slight ptosis on the left.  Dentition is immaculate.  A-fib is present clinically.  Abdomen is protuberant.  She has 1/2+ edema at the sock line.  Pedal pulses are decreased.  There is a well-healed eschar lesion over the left distal anterior ankle.  Interosseous wasting of the hands is present.  She did exhibit intermittent repeated lipsmacking in the context of just finishing her lunch.  General  appearance: Adequately nourished; no acute  distress, increased work of breathing is present.   Lymphatic: No lymphadenopathy about the head, neck, axilla. Eyes: No conjunctival inflammation or lid edema is present. There is no scleral icterus. Ears:  External ear exam shows no significant lesions or deformities.   Nose:  External nasal examination shows no deformity or inflammation. Nasal mucosa are pink and moist without lesions, exudates Oral exam:  Lips and gums are healthy appearing. There is no oropharyngeal erythema or exudate. Neck:  No thyromegaly, masses, tenderness noted.    Heart:  No gallop, murmur, click, rub .  Lungs: Chest clear to auscultation without wheezes, rhonchi, rales, rubs. Abdomen: Bowel sounds are normal. Abdomen is soft and nontender with no organomegaly, hernias, masses. GU: Deferred  Extremities:  No cyanosis, clubbing  Neurologic exam :Balance, Rhomberg, finger to nose testing could not be completed due to clinical state Skin: Warm & dry w/o tenting. No significant lesions or rash.  See summary under each active problem in the Problem List with associated updated therapeutic plan

## 2022-11-18 DIAGNOSIS — Z20828 Contact with and (suspected) exposure to other viral communicable diseases: Secondary | ICD-10-CM | POA: Diagnosis not present

## 2022-11-18 DIAGNOSIS — M9712XD Periprosthetic fracture around internal prosthetic left knee joint, subsequent encounter: Secondary | ICD-10-CM | POA: Diagnosis not present

## 2022-11-18 DIAGNOSIS — Z1152 Encounter for screening for COVID-19: Secondary | ICD-10-CM | POA: Diagnosis not present

## 2022-11-18 NOTE — Assessment & Plan Note (Addendum)
Trazadone resulted in hallucinations & was D/Ced.  Initially she did not remember being seen 1/5 for follow-up of the patellar fracture by Dr. Ephraim Hamburger.

## 2022-11-18 NOTE — Assessment & Plan Note (Signed)
Rate adequately controlled; she remains on Eliquis prophylaxis.

## 2022-11-18 NOTE — Assessment & Plan Note (Addendum)
Seen by Orthopedics 11/15/2022; brace removed from the LLE.  The right heel cord construction will need PT and stretching.  She was to be WBAT without restrictions.

## 2022-11-18 NOTE — Patient Instructions (Signed)
See assessment and plan under each diagnosis in the problem list and acutely for this visit 

## 2022-11-22 DIAGNOSIS — Z471 Aftercare following joint replacement surgery: Secondary | ICD-10-CM | POA: Diagnosis not present

## 2022-11-22 DIAGNOSIS — M9712XD Periprosthetic fracture around internal prosthetic left knee joint, subsequent encounter: Secondary | ICD-10-CM | POA: Diagnosis not present

## 2022-11-22 DIAGNOSIS — R278 Other lack of coordination: Secondary | ICD-10-CM | POA: Diagnosis not present

## 2022-11-22 DIAGNOSIS — R262 Difficulty in walking, not elsewhere classified: Secondary | ICD-10-CM | POA: Diagnosis not present

## 2022-11-25 DIAGNOSIS — M1712 Unilateral primary osteoarthritis, left knee: Secondary | ICD-10-CM | POA: Diagnosis not present

## 2022-11-25 DIAGNOSIS — H9113 Presbycusis, bilateral: Secondary | ICD-10-CM | POA: Diagnosis not present

## 2022-11-25 DIAGNOSIS — G43909 Migraine, unspecified, not intractable, without status migrainosus: Secondary | ICD-10-CM | POA: Diagnosis not present

## 2022-11-25 DIAGNOSIS — R279 Unspecified lack of coordination: Secondary | ICD-10-CM | POA: Diagnosis not present

## 2022-11-25 DIAGNOSIS — K5909 Other constipation: Secondary | ICD-10-CM | POA: Diagnosis not present

## 2022-11-25 DIAGNOSIS — K219 Gastro-esophageal reflux disease without esophagitis: Secondary | ICD-10-CM | POA: Diagnosis not present

## 2022-11-25 DIAGNOSIS — I4811 Longstanding persistent atrial fibrillation: Secondary | ICD-10-CM | POA: Diagnosis not present

## 2022-11-25 DIAGNOSIS — Z8673 Personal history of transient ischemic attack (TIA), and cerebral infarction without residual deficits: Secondary | ICD-10-CM | POA: Diagnosis not present

## 2022-11-25 DIAGNOSIS — M25571 Pain in right ankle and joints of right foot: Secondary | ICD-10-CM | POA: Diagnosis not present

## 2022-11-25 DIAGNOSIS — Z471 Aftercare following joint replacement surgery: Secondary | ICD-10-CM | POA: Diagnosis not present

## 2022-11-25 DIAGNOSIS — G43809 Other migraine, not intractable, without status migrainosus: Secondary | ICD-10-CM | POA: Diagnosis not present

## 2022-11-25 DIAGNOSIS — D649 Anemia, unspecified: Secondary | ICD-10-CM | POA: Diagnosis not present

## 2022-11-25 DIAGNOSIS — M5459 Other low back pain: Secondary | ICD-10-CM | POA: Diagnosis not present

## 2022-11-25 DIAGNOSIS — H900 Conductive hearing loss, bilateral: Secondary | ICD-10-CM | POA: Diagnosis not present

## 2022-11-25 DIAGNOSIS — F332 Major depressive disorder, recurrent severe without psychotic features: Secondary | ICD-10-CM | POA: Diagnosis not present

## 2022-11-25 DIAGNOSIS — J45909 Unspecified asthma, uncomplicated: Secondary | ICD-10-CM | POA: Diagnosis not present

## 2022-11-25 DIAGNOSIS — M6281 Muscle weakness (generalized): Secondary | ICD-10-CM | POA: Diagnosis not present

## 2022-11-25 DIAGNOSIS — G2401 Drug induced subacute dyskinesia: Secondary | ICD-10-CM | POA: Diagnosis not present

## 2022-11-25 DIAGNOSIS — Z741 Need for assistance with personal care: Secondary | ICD-10-CM | POA: Diagnosis not present

## 2022-11-25 DIAGNOSIS — I639 Cerebral infarction, unspecified: Secondary | ICD-10-CM | POA: Diagnosis not present

## 2022-11-25 DIAGNOSIS — M79671 Pain in right foot: Secondary | ICD-10-CM | POA: Diagnosis not present

## 2022-11-25 DIAGNOSIS — R2681 Unsteadiness on feet: Secondary | ICD-10-CM | POA: Diagnosis not present

## 2022-11-25 DIAGNOSIS — M159 Polyosteoarthritis, unspecified: Secondary | ICD-10-CM | POA: Diagnosis not present

## 2022-11-25 DIAGNOSIS — Z96652 Presence of left artificial knee joint: Secondary | ICD-10-CM | POA: Diagnosis not present

## 2022-11-25 DIAGNOSIS — N393 Stress incontinence (female) (male): Secondary | ICD-10-CM | POA: Diagnosis not present

## 2022-11-25 DIAGNOSIS — R262 Difficulty in walking, not elsewhere classified: Secondary | ICD-10-CM | POA: Diagnosis not present

## 2022-11-25 DIAGNOSIS — R488 Other symbolic dysfunctions: Secondary | ICD-10-CM | POA: Diagnosis not present

## 2022-11-25 DIAGNOSIS — R278 Other lack of coordination: Secondary | ICD-10-CM | POA: Diagnosis not present

## 2022-11-25 DIAGNOSIS — M9712XD Periprosthetic fracture around internal prosthetic left knee joint, subsequent encounter: Secondary | ICD-10-CM | POA: Diagnosis not present

## 2022-11-25 DIAGNOSIS — G8929 Other chronic pain: Secondary | ICD-10-CM | POA: Diagnosis not present

## 2022-11-25 DIAGNOSIS — E785 Hyperlipidemia, unspecified: Secondary | ICD-10-CM | POA: Diagnosis not present

## 2022-11-25 DIAGNOSIS — F411 Generalized anxiety disorder: Secondary | ICD-10-CM | POA: Diagnosis not present

## 2022-11-25 DIAGNOSIS — F339 Major depressive disorder, recurrent, unspecified: Secondary | ICD-10-CM | POA: Diagnosis not present

## 2022-11-26 ENCOUNTER — Ambulatory Visit: Payer: PPO | Admitting: Internal Medicine

## 2022-12-04 DIAGNOSIS — F411 Generalized anxiety disorder: Secondary | ICD-10-CM | POA: Diagnosis not present

## 2022-12-04 DIAGNOSIS — G2401 Drug induced subacute dyskinesia: Secondary | ICD-10-CM | POA: Diagnosis not present

## 2022-12-04 DIAGNOSIS — F332 Major depressive disorder, recurrent severe without psychotic features: Secondary | ICD-10-CM | POA: Diagnosis not present

## 2022-12-06 ENCOUNTER — Other Ambulatory Visit: Payer: Self-pay | Admitting: Adult Health

## 2022-12-06 MED ORDER — MODAFINIL 100 MG PO TABS: 100.0000 mg | ORAL_TABLET | Freq: Every day | ORAL | 0 refills | Status: DC

## 2022-12-12 ENCOUNTER — Encounter: Payer: Self-pay | Admitting: Adult Health

## 2022-12-12 ENCOUNTER — Non-Acute Institutional Stay (SKILLED_NURSING_FACILITY): Payer: PPO | Admitting: Adult Health

## 2022-12-12 DIAGNOSIS — I4811 Longstanding persistent atrial fibrillation: Secondary | ICD-10-CM

## 2022-12-12 DIAGNOSIS — K5909 Other constipation: Secondary | ICD-10-CM

## 2022-12-12 DIAGNOSIS — J45909 Unspecified asthma, uncomplicated: Secondary | ICD-10-CM | POA: Diagnosis not present

## 2022-12-12 DIAGNOSIS — G43909 Migraine, unspecified, not intractable, without status migrainosus: Secondary | ICD-10-CM

## 2022-12-12 NOTE — Progress Notes (Signed)
Location:  [place] Room Number: 101-W Place of Service:  SNF (31)   CODE STATUS: Full Code  Allergies  Allergen Reactions   Ticlid [Ticlopidine] Shortness Of Breath   Nsaids Other (See Comments)    Bleeding ulcer   Prednisone Other (See Comments)    Makes her stomach hurt--knows this isn't an allergy but doesn't want to take it    Trazodone And Nefazodone     hjallucinations   Monistat [Miconazole] Swelling and Rash   Sulfa Antibiotics Rash    Chief Complaint  Patient presents with   Medical Management of Chronic Issues                    Longstanding persistent atrial fibrillation   Migraine without status migrainosus; non-intractable unspecified type:     Uncomplicated asthma unspecified asthma severity unspecified; unspecified whether persistent:  Chronic constipation:    HPI:  She is a 77 year old resident of this facility being seen for the management of her chronic illnesses:  Longstanding persistent atrial fibrillation   Migraine without status migrainosus; non-intractable unspecified type:     Uncomplicated asthma unspecified asthma severity unspecified; unspecified whether persistent:  Chronic constipation. She continues to participate in therapy: stand pivot: mod to total assit; bed mobility mod I; upper body supervision; lower body mod to max A; brp: mod assist; nonambulatory. There are no reports of uncontrolled pain present.   Past Medical History:  Diagnosis Date   Anemia    Arthritis    Asthma    Atrial fibrillation ([place])    COVID-19 08/05/2022   Depression    H/O head injury    Hearing loss    Heart murmur    can be heard at times, Doctor said not to worry   History of kidney stones    History of stomach ulcers    Migraine    Pneumonia    Psittacosis    Stroke ([place])    Weakness of neck 03/02/2013    Past Surgical History:  Procedure Laterality Date   ANKLE FRACTURE SURGERY Left    APPENDECTOMY     BREAST LUMPECTOMY      CESAREAN SECTION     x 3   I & D KNEE WITH POLY EXCHANGE Left 08/16/2022   Procedure: KNEE POLY EXCHANGE;  Surgeon: Hiram Gash, MD;  Location: [facility];  Service: Orthopedics;  Laterality: Left;   KNEE ARTHROSCOPY WITH PATELLAR TENDON REPAIR Left 08/16/2022   Procedure: IRRIGATION AND DEBRIDEMENT LEFT KNEE REVISION WITH PATELLAR TENDON REPAIR and poly exchange;  Surgeon: Hiram Gash, MD;  Location: [facility];  Service: Orthopedics;  Laterality: Left;   PARTIAL HYSTERECTOMY     ROTATOR CUFF REPAIR Right    TOTAL KNEE ARTHROPLASTY Left 07/22/2022   Procedure: TOTAL KNEE ARTHROPLASTY;  Surgeon: [name], MD;  Location: [facility];  Service: Orthopedics;  Laterality: Left;    Social History   Socioeconomic History   Marital status: Married    Spouse name: Not on file   Number of children: 3   Years of education: Not on file   Highest education level: Not on file  Occupational History   Occupation: retired  Tobacco Use   Smoking status: Never   Smokeless tobacco: Never  Vaping Use   Vaping Use: Never used  Substance and Sexual Activity   Alcohol use: Not Currently    Comment: occ beer   Drug use: No   Sexual activity:  Yes    Birth control/protection: Surgical    Comment: hyst  Other Topics Concern   Not on file  Social History Narrative   Not on file   Social Determinants of Health   Financial Resource Strain: Medium Risk (01/30/2021)   Overall Financial Resource Strain (CARDIA)    Difficulty of Paying Living Expenses: Somewhat hard  Food Insecurity: No Food Insecurity (08/15/2022)   Hunger Vital Sign    Worried About Running Out of Food in the Last Year: Never true    Ran Out of Food in the Last Year: Never true  Transportation Needs: No Transportation Needs (08/15/2022)   PRAPARE - Hydrologist (Medical): No    Lack of Transportation (Non-Medical): No  Physical Activity: Insufficiently Active (01/30/2021)   Exercise Vital Sign    Days  of Exercise per Week: 2 days    Minutes of Exercise per Session: 10 min  Stress: No Stress Concern Present (01/30/2021)   [place]    Feeling of Stress : Only a little  Social Connections: Moderately Integrated (01/30/2021)   Social Connection and Isolation Panel [NHANES]    Frequency of Communication with Friends and Family: Twice a week    Frequency of Social Gatherings with Friends and Family: Twice a week    Attends Religious Services: More than 4 times per year    Active Member of [employer] or Organizations: No    Attends Archivist Meetings: Never    Marital Status: Married  Human resources officer Violence: Not At Risk (08/15/2022)   Humiliation, Afraid, Rape, and Kick questionnaire    Fear of Current or Ex-Partner: No    Emotionally Abused: No    Physically Abused: No    Sexually Abused: No   Family History  Problem Relation Age of Onset   Cancer Father    Heart disease Brother    Alcohol abuse Brother    Depression Maternal Grandmother    Depression Grandchild    Breast cancer Daughter       VITAL SIGNS BP (!) 112/45   Pulse 83   Temp 97.8 F (36.6 C)   Resp (!) 22   Ht '5\' 5"'$  (1.651 m)   Wt 180 lb (81.6 kg)   SpO2 97%   BMI 29.95 kg/m   Outpatient Encounter Medications as of 12/12/2022  Medication Sig   acetaminophen (TYLENOL) 500 MG tablet Take 1,000 mg by mouth every 8 (eight) hours as needed for headache or mild pain.   apixaban (ELIQUIS) 5 MG TABS tablet Take 1 tablet (5 mg total) by mouth 2 (two) times daily.   ARIPiprazole (ABILIFY) 10 MG tablet Take 10 mg by mouth daily.   busPIRone (BUSPAR) 30 MG tablet Take 1 tablet (30 mg total) by mouth 2 (two) times daily.   Cholecalciferol (VITAMIN D) 125 MCG (5000 UT) CAPS Take 5,000 Units by mouth daily.   donepezil (ARICEPT) 10 MG tablet Take 10 mg by mouth at bedtime.   DULoxetine (CYMBALTA) 60 MG capsule Take 1 capsule (60 mg total) by mouth 2  (two) times daily.   eszopiclone (LUNESTA) 1 MG TABS tablet Take 1 tablet (1 mg total) by mouth at bedtime. Take immediately before bedtime   furosemide (LASIX) 20 MG tablet Take 20 mg by mouth.   metoprolol tartrate (LOPRESSOR) 25 MG tablet Take 1 tablet (25 mg total) by mouth 2 (two) times daily.   modafinil (PROVIGIL) 100 MG tablet  Take 1 tablet (100 mg total) by mouth daily.   MYRBETRIQ 50 MG TB24 tablet Take 50 mg by mouth daily.   polyethylene glycol (MIRALAX / GLYCOLAX) 17 g packet Take 17 g by mouth daily as needed.   senna (SENOKOT) 8.6 MG TABS tablet Take 1 tablet (8.6 mg total) by mouth 2 (two) times daily.   SUMAtriptan (IMITREX) 100 MG tablet Take 100 mg by mouth every 2 (two) hours as needed for migraine or headache. May repeat in 2 hours if headache persists or recurs.   UNABLE TO FIND Diet: Regular   valbenazine (INGREZZA) 80 MG capsule Take 80 mg by mouth daily.   ZINC OXIDE EX Apply 1 Application topically See admin instructions. every shift and prn Special Instructions: Apply to sacrum, bilateral buttocks and coccyx q shift for redness.   LORazepam (ATIVAN) 0.5 MG tablet Take 0.5 tablets (0.25 mg total) by mouth 2 (two) times daily as needed for anxiety. (Patient not taking: Reported on 12/12/2022)   LORazepam (ATIVAN) 0.5 MG tablet Take 0.5 tablets (0.25 mg total) by mouth at bedtime. (Patient not taking: Reported on 12/12/2022)   Nutritional Supplements (ENSURE ENLIVE PO) Take 1 Can by mouth 2 (two) times daily between meals. (Patient not taking: Reported on 12/12/2022)   [DISCONTINUED] ARIPiprazole (ABILIFY) 5 MG tablet Take 5 mg by mouth daily.   [DISCONTINUED] valbenazine (INGREZZA) 40 MG capsule Take 40 mg by mouth daily.   No facility-administered encounter medications on file as of 12/12/2022.     SIGNIFICANT DIAGNOSTIC EXAMS  PREVIOUS   08-16-22: chest x-ray: 1. No acute intrathoracic process.   08-21-22: ct of head:  1. Possible sulcal effacement and loss of  gray-white differentiation in the posterior right frontal lobe may represent subacute infarct versus artifact. Consider further evaluation with MRI. 2. Mild chronic small vessel ischemia.  08-22-22: MRI head:  1. No acute intracranial abnormality. 2. Findings of chronic small vessel ischemia and volume loss.  NO NEW EXAMS   LABS REVIEWED PREVIOUS   08-04-22: wbc 6.9; hgb 10.1; hct 31.8; mcv 97.5 plt 371; glucose 93; bun 15; creat 0.66; k+ 4.0; na++ 135; ca 8.8; gfr >60 mag 1.8; tsh 1.460; free t3: 1.8; free t4: 1.02 08-14-22: wbc 9.3; hgb 10.5; hct 31.8 mcv 94.9 plt 324; glucose 93; bun 15 creat 0.66; k+ 3.6; na++ 138; ca 9.1; gfr >60 08-23-22: wbc 9.1; hgb 10.2; hct 31.8; mcv 94.9 plt 379'; glucose 100; bun 12; creat 0.61; k+ 4.3; na++ 139; ca 9.0; gfr >60  TODAY  10-11-22: glucose 87; bun 16; creat 0.59; k+ 3.7; na++ 137; ca 9.4; gfr >60 10-17-22: glucose 98; bun 17; creat 0.56; k+ 4.1; na++ 138; ca 9.4; gfr >60 11-14-22: hepatitis C nr   Review of Systems  Constitutional:  Negative for malaise/fatigue.  Respiratory:  Negative for cough and shortness of breath.   Cardiovascular:  Negative for chest pain, palpitations and leg swelling.  Gastrointestinal:  Negative for abdominal pain, constipation and heartburn.  Musculoskeletal:  Negative for back pain, joint pain and myalgias.  Skin: Negative.   Neurological:  Negative for dizziness.  Psychiatric/Behavioral:  The patient is not nervous/anxious.    Physical Exam Constitutional:      General: She is not in acute distress.    Appearance: She is well-developed. She is not diaphoretic.  Neck:     Thyroid: No thyromegaly.  Cardiovascular:     Rate and Rhythm: Normal rate and regular rhythm.     Pulses: Normal pulses.  Heart sounds: Normal heart sounds.  Pulmonary:     Effort: Pulmonary effort is normal. No respiratory distress.     Breath sounds: Normal breath sounds.  Abdominal:     General: Bowel sounds are normal. There is no  distension.     Palpations: Abdomen is soft.     Tenderness: There is no abdominal tenderness.  Musculoskeletal:     Cervical back: Neck supple.     Right lower leg: No edema.     Left lower leg: No edema.     Comments: Is able to move all extremities   Lymphadenopathy:     Cervical: No cervical adenopathy.  Skin:    General: Skin is warm and dry.  Neurological:     Mental Status: She is alert. Mental status is at baseline.     Comments: Does have abnormal chin and lip movements  Psychiatric:        Mood and Affect: Mood normal.        ASSESSMENT/ PLAN:  TODAY  Longstanding persistent atrial fibrillation/history of cva/history of dvt: heart rate is stable will continue eliquis 5 mg twice daily take lopressor 25 mg twice daily for rate control   2.  Migraine without status migrainosus; non-intractable unspecified type: will continue imitrex 100 mg twice daily as needed   3. Uncomplicated asthma unspecified asthma severity unspecified; unspecified whether persistent: is stable  4. Chronic constipation: will continue senna twice daily and has miralax daily as needed  PREVIOUS   5. Vascular dementia without behavioral disturbance/neurocognitive deficit: weight is 180 pounds; will continue aricept 10 mg daily   6. Tardive dyskinesia: continues to have abnormal facial movements; will continue ingrezza 40 mg daily   7.  Chronic pain syndrome/periprosthetic fracture around internal prosthetic left knee joint sequela: has prn tylenol and cymbalta 60 mg twice daily    8.  Bilateral lower extremity edema: will monitor   9. Chronic anemia: hgb 10.2  10. Chronic insomnia: will continue lunesta 1 mg nightly   11. Psychosis in elderly / major depression with psychotic features; at times feels as though she is in the basement. Will continue abilify 5 mg daily; cymbalta 60 mg twice daily buspar 30 mg twice daily; has ativan 0.25 mg nightly and twice daily as needed   12. Somnolence:  will continue provigil 200 mg daily   13. Protein calorie malnutrition will continue ensure twice daily   14. Urinary incontinence: will continue myrbetriq 50 mg daily    Ok Edwards NP [facility]   call [phone]

## 2022-12-13 ENCOUNTER — Non-Acute Institutional Stay (SKILLED_NURSING_FACILITY): Payer: PPO | Admitting: Adult Health

## 2022-12-13 ENCOUNTER — Encounter: Payer: Self-pay | Admitting: Adult Health

## 2022-12-13 DIAGNOSIS — G2401 Drug induced subacute dyskinesia: Secondary | ICD-10-CM

## 2022-12-13 DIAGNOSIS — Z8673 Personal history of transient ischemic attack (TIA), and cerebral infarction without residual deficits: Secondary | ICD-10-CM | POA: Diagnosis not present

## 2022-12-13 DIAGNOSIS — I4811 Longstanding persistent atrial fibrillation: Secondary | ICD-10-CM | POA: Diagnosis not present

## 2022-12-13 NOTE — Progress Notes (Signed)
Location:  [place] Room Number: NO/101/W Place of Service:  SNF (31) [name] S.,NP  CODE STATUS: FULL  Allergies  Allergen Reactions   Ticlid [Ticlopidine] Shortness Of Breath   Nsaids Other (See Comments)    Bleeding ulcer   Prednisone Other (See Comments)    Makes her stomach hurt--knows this isn't an allergy but doesn't want to take it    Trazodone And Nefazodone     hjallucinations   Monistat [Miconazole] Swelling and Rash   Sulfa Antibiotics Rash    Chief Complaint  Patient presents with   Acute Visit    Patient is being seen  for care plan meeting    HPI:  We have come together for her care plan meeting. Family present. BIMS 15/15 mood 6/30: nervous at times; depressed at times. Requires assistance with his adls. She is incontinent of bladder and frequently incontinent of bowel. Dietary: weight is 180 pounds regular diet; feeds self. Therapy: stand pivot moderate to max assist; slide board with contact guard; ambulate with rolling walker 5 feet enough to transfer moderate assist; upper body: supervision to min assist; lower body mod assist; brp mod assist wheelchair mobility at supervision. Working on orthotics  Activities: does attend on some occasions. She will continue to be followed for her chronic illnesses including: long standing persistent atrial fibrillation; tardive dyskinesia history of CVA (cerebrovascular accident)   Past Medical History:  Diagnosis Date   Anemia    Arthritis    Asthma    Atrial fibrillation ([place])    COVID-19 08/05/2022   Depression    H/O head injury    Hearing loss    Heart murmur    can be heard at times, Doctor said not to worry   History of kidney stones    History of stomach ulcers    Migraine    Pneumonia    Psittacosis    Stroke ([place])    Weakness of neck 03/02/2013    Past Surgical History:  Procedure Laterality Date   ANKLE FRACTURE SURGERY Left    APPENDECTOMY     BREAST LUMPECTOMY      CESAREAN SECTION     x 3   I & D KNEE WITH POLY EXCHANGE Left 08/16/2022   Procedure: KNEE POLY EXCHANGE;  Surgeon: Hiram Gash, MD;  Location: [place];  Service: Orthopedics;  Laterality: Left;   KNEE ARTHROSCOPY WITH PATELLAR TENDON REPAIR Left 08/16/2022   Procedure: IRRIGATION AND DEBRIDEMENT LEFT KNEE REVISION WITH PATELLAR TENDON REPAIR and poly exchange;  Surgeon: Hiram Gash, MD;  Location: [place];  Service: Orthopedics;  Laterality: Left;   PARTIAL HYSTERECTOMY     ROTATOR CUFF REPAIR Right    TOTAL KNEE ARTHROPLASTY Left 07/22/2022   Procedure: TOTAL KNEE ARTHROPLASTY;  Surgeon: [name], MD;  Location[facility];  Service: Orthopedics;  Laterality: Left;    Social History   Socioeconomic History   Marital status: Married    Spouse name: Not on file   Number of children: 3   Years of education: Not on file   Highest education level: Not on file  Occupational History   Occupation: retired  Tobacco Use   Smoking status: Never   Smokeless tobacco: Never  Vaping Use   Vaping Use: Never used  Substance and Sexual Activity   Alcohol use: Not Currently    Comment: occ beer   Drug use: No   Sexual activity: Yes    Birth control/protection: Surgical  Comment: hyst  Other Topics Concern   Not on file  Social History Narrative   Not on file   Social Determinants of Health   Financial Resource Strain: Medium Risk (01/30/2021)   Overall Financial Resource Strain (CARDIA)    Difficulty of Paying Living Expenses: Somewhat hard  Food Insecurity: No Food Insecurity (08/15/2022)   Hunger Vital Sign    Worried About Running Out of Food in the Last Year: Never true    Ran Out of Food in the Last Year: Never true  Transportation Needs: No Transportation Needs (08/15/2022)   PRAPARE - Hydrologist (Medical): No    Lack of Transportation (Non-Medical): No  Physical Activity: Insufficiently Active (01/30/2021)   Exercise Vital Sign    Days  of Exercise per Week: 2 days    Minutes of Exercise per Session: 10 min  Stress: No Stress Concern Present (01/30/2021)   [place]    Feeling of Stress : Only a little  Social Connections: Moderately Integrated (01/30/2021)   Social Connection and Isolation Panel [NHANES]    Frequency of Communication with Friends and Family: Twice a week    Frequency of Social Gatherings with Friends and Family: Twice a week    Attends Religious Services: More than 4 times per year    Active Member of [employer] or Organizations: No    Attends Archivist Meetings: Never    Marital Status: Married  Human resources officer Violence: Not At Risk (08/15/2022)   Humiliation, Afraid, Rape, and Kick questionnaire    Fear of Current or Ex-Partner: No    Emotionally Abused: No    Physically Abused: No    Sexually Abused: No   Family History  Problem Relation Age of Onset   Cancer Father    Heart disease Brother    Alcohol abuse Brother    Depression Maternal Grandmother    Depression Grandchild    Breast cancer Daughter       VITAL SIGNS BP 113/69   Pulse 99   Temp 97.8 F (36.6 C)   Resp (!) 22   Ht '5\' 5"'$  (1.651 m)   Wt 167 lb 12.8 oz (76.1 kg)   SpO2 97%   BMI 27.92 kg/m   Outpatient Encounter Medications as of 12/13/2022  Medication Sig   acetaminophen (TYLENOL) 500 MG tablet Take 1,000 mg by mouth every 8 (eight) hours as needed for headache or mild pain.   apixaban (ELIQUIS) 5 MG TABS tablet Take 1 tablet (5 mg total) by mouth 2 (two) times daily.   ARIPiprazole (ABILIFY) 10 MG tablet Take 10 mg by mouth daily.   busPIRone (BUSPAR) 30 MG tablet Take 1 tablet (30 mg total) by mouth 2 (two) times daily.   Cholecalciferol (VITAMIN D) 125 MCG (5000 UT) CAPS Take 5,000 Units by mouth daily.   donepezil (ARICEPT) 10 MG tablet Take 10 mg by mouth at bedtime.   DULoxetine (CYMBALTA) 60 MG capsule Take 1 capsule (60 mg total) by  mouth 2 (two) times daily.   eszopiclone (LUNESTA) 1 MG TABS tablet Take 1 tablet (1 mg total) by mouth at bedtime. Take immediately before bedtime   furosemide (LASIX) 20 MG tablet Take 20 mg by mouth.   metoprolol tartrate (LOPRESSOR) 25 MG tablet Take 1 tablet (25 mg total) by mouth 2 (two) times daily.   modafinil (PROVIGIL) 100 MG tablet Take 1 tablet (100 mg total) by mouth daily.  MYRBETRIQ 50 MG TB24 tablet Take 50 mg by mouth daily.   Nutritional Supplements (ENSURE ENLIVE PO) Take 1 Can by mouth 2 (two) times daily between meals.   senna (SENOKOT) 8.6 MG TABS tablet Take 1 tablet (8.6 mg total) by mouth 2 (two) times daily.   SUMAtriptan (IMITREX) 100 MG tablet Take 100 mg by mouth every 2 (two) hours as needed for migraine or headache. May repeat in 2 hours if headache persists or recurs.   UNABLE TO FIND Diet: Regular   valbenazine (INGREZZA) 80 MG capsule Take 80 mg by mouth daily.   ZINC OXIDE EX Apply 1 Application topically See admin instructions. every shift and prn Special Instructions: Apply to sacrum, bilateral buttocks and coccyx q shift for redness.   LORazepam (ATIVAN) 0.5 MG tablet Take 0.5 tablets (0.25 mg total) by mouth 2 (two) times daily as needed for anxiety. (Patient not taking: Reported on 12/12/2022)   LORazepam (ATIVAN) 0.5 MG tablet Take 0.5 tablets (0.25 mg total) by mouth at bedtime. (Patient not taking: Reported on 12/12/2022)   polyethylene glycol (MIRALAX / GLYCOLAX) 17 g packet Take 17 g by mouth daily as needed.   No facility-administered encounter medications on file as of 12/13/2022.     SIGNIFICANT DIAGNOSTIC EXAMS  PREVIOUS   08-16-22: chest x-ray: 1. No acute intrathoracic process.   08-21-22: ct of head:  1. Possible sulcal effacement and loss of gray-white differentiation in the posterior right frontal lobe may represent subacute infarct versus artifact. Consider further evaluation with MRI. 2. Mild chronic small vessel ischemia.  08-22-22:  MRI head:  1. No acute intracranial abnormality. 2. Findings of chronic small vessel ischemia and volume loss.  NO NEW EXAMS   LABS REVIEWED PREVIOUS   08-04-22: wbc 6.9; hgb 10.1; hct 31.8; mcv 97.5 plt 371; glucose 93; bun 15; creat 0.66; k+ 4.0; na++ 135; ca 8.8; gfr >60 mag 1.8; tsh 1.460; free t3: 1.8; free t4: 1.02 08-14-22: wbc 9.3; hgb 10.5; hct 31.8 mcv 94.9 plt 324; glucose 93; bun 15 creat 0.66; k+ 3.6; na++ 138; ca 9.1; gfr >60 08-23-22: wbc 9.1; hgb 10.2; hct 31.8; mcv 94.9 plt 379'; glucose 100; bun 12; creat 0.61; k+ 4.3; na++ 139; ca 9.0; gfr >60 10-11-22: glucose 87; bun 16; creat 0.59; k+ 3.7; na++ 137; ca 9.4; gfr >60 10-17-22: glucose 98; bun 17; creat 0.56; k+ 4.1; na++ 138; ca 9.4; gfr >60 11-14-22: hepatitis C nr   NO NEW LABS.   Review of Systems  Constitutional:  Negative for malaise/fatigue.  Respiratory:  Negative for cough and shortness of breath.   Cardiovascular:  Negative for chest pain, palpitations and leg swelling.  Gastrointestinal:  Negative for abdominal pain, constipation and heartburn.  Musculoskeletal:  Negative for back pain, joint pain and myalgias.  Skin: Negative.   Neurological:  Negative for dizziness.  Psychiatric/Behavioral:  The patient is not nervous/anxious.    Physical Exam Constitutional:      General: She is not in acute distress.    Appearance: She is well-developed. She is not diaphoretic.  Neck:     Thyroid: No thyromegaly.  Cardiovascular:     Rate and Rhythm: Normal rate and regular rhythm.     Pulses: Normal pulses.     Heart sounds: Normal heart sounds.  Pulmonary:     Effort: Pulmonary effort is normal. No respiratory distress.     Breath sounds: Normal breath sounds.  Abdominal:     General: Bowel sounds are normal. There  is no distension.     Palpations: Abdomen is soft.     Tenderness: There is no abdominal tenderness.  Musculoskeletal:     Cervical back: Neck supple.     Right lower leg: No edema.     Left  lower leg: No edema.     Comments: Is able to move all extremities    Lymphadenopathy:     Cervical: No cervical adenopathy.  Skin:    General: Skin is warm and dry.  Neurological:     Mental Status: She is alert and oriented to person, place, and time.     Comments: Does have abnormal chin and lip movements   Psychiatric:        Mood and Affect: Mood normal.       ASSESSMENT/ PLAN:  TODAY  Long standing persistent atrial fibrillation Tardive dyskinesia History of CVA (cerebrovascular accident)   Will continue therapy as directed Will continue current medications Will continue to monitor her status.   Time spent with patient: 40 minutes: therapy; medications; dietary.    Ok Edwards NP [facility]   call [phone]

## 2022-12-17 DIAGNOSIS — M1712 Unilateral primary osteoarthritis, left knee: Secondary | ICD-10-CM | POA: Diagnosis not present

## 2022-12-18 ENCOUNTER — Encounter: Payer: Self-pay | Admitting: Internal Medicine

## 2022-12-18 DIAGNOSIS — M24573 Contracture, unspecified ankle: Secondary | ICD-10-CM | POA: Insufficient documentation

## 2022-12-19 ENCOUNTER — Other Ambulatory Visit: Payer: Self-pay | Admitting: Adult Health

## 2022-12-19 MED ORDER — ESZOPICLONE 1 MG PO TABS: 1.0000 mg | ORAL_TABLET | Freq: Every day | ORAL | 0 refills | Status: DC

## 2022-12-25 DIAGNOSIS — M79671 Pain in right foot: Secondary | ICD-10-CM | POA: Diagnosis not present

## 2022-12-25 DIAGNOSIS — M25571 Pain in right ankle and joints of right foot: Secondary | ICD-10-CM | POA: Diagnosis not present

## 2022-12-30 ENCOUNTER — Non-Acute Institutional Stay (SKILLED_NURSING_FACILITY): Payer: PPO | Admitting: Adult Health

## 2022-12-30 ENCOUNTER — Encounter: Payer: Self-pay | Admitting: Adult Health

## 2022-12-30 DIAGNOSIS — G2401 Drug induced subacute dyskinesia: Secondary | ICD-10-CM | POA: Diagnosis not present

## 2022-12-30 NOTE — Progress Notes (Signed)
Location:  [place] Room Number: 101 Place of Service:  SNF (31)   CODE STATUS: full   Allergies  Allergen Reactions   Ticlid [Ticlopidine] Shortness Of Breath   Nsaids Other (See Comments)    Bleeding ulcer   Prednisone Other (See Comments)    Makes her stomach hurt--knows this isn't an allergy but doesn't want to take it    Trazodone And Nefazodone     hjallucinations   Monistat [Miconazole] Swelling and Rash   Sulfa Antibiotics Rash    Chief Complaint  Patient presents with   Acute Visit    Medication concerns     HPI:  Her family is having concerns regarding the cost of ingrezza. They cannot afford the medication; this medication is effective in managing her TD movements. We will need to stop this medication and try an alternative medication.   Past Medical History:  Diagnosis Date   Anemia    Arthritis    Asthma    Atrial fibrillation ([place])    COVID-19 08/05/2022   Depression    H/O head injury    Hearing loss    Heart murmur    can be heard at times, Doctor said not to worry   History of kidney stones    History of stomach ulcers    Migraine    Pneumonia    Psittacosis    Stroke ([place])    Weakness of neck 03/02/2013    Past Surgical History:  Procedure Laterality Date   ANKLE FRACTURE SURGERY Left    APPENDECTOMY     BREAST LUMPECTOMY     CESAREAN SECTION     x 3   I & D KNEE WITH POLY EXCHANGE Left 08/16/2022   Procedure: KNEE POLY EXCHANGE;  Surgeon: Hiram Gash, MD;  Location: [place];  Service: Orthopedics;  Laterality: Left;   KNEE ARTHROSCOPY WITH PATELLAR TENDON REPAIR Left 08/16/2022   Procedure: IRRIGATION AND DEBRIDEMENT LEFT KNEE REVISION WITH PATELLAR TENDON REPAIR and poly exchange;  Surgeon: Hiram Gash, MD;  Location: [place];  Service: Orthopedics;  Laterality: Left;   PARTIAL HYSTERECTOMY     ROTATOR CUFF REPAIR Right    TOTAL KNEE ARTHROPLASTY Left 07/22/2022   Procedure: TOTAL KNEE ARTHROPLASTY;   Surgeon: [name], MD;  Location: [facility];  Service: Orthopedics;  Laterality: Left;    Social History   Socioeconomic History   Marital status: Married    Spouse name: Not on file   Number of children: 3   Years of education: Not on file   Highest education level: Not on file  Occupational History   Occupation: retired  Tobacco Use   Smoking status: Never   Smokeless tobacco: Never  Vaping Use   Vaping Use: Never used  Substance and Sexual Activity   Alcohol use: Not Currently    Comment: occ beer   Drug use: No   Sexual activity: Yes    Birth control/protection: Surgical    Comment: hyst  Other Topics Concern   Not on file  Social History Narrative   Not on file   Social Determinants of Health   Financial Resource Strain: Medium Risk (01/30/2021)   Overall Financial Resource Strain (CARDIA)    Difficulty of Paying Living Expenses: Somewhat hard  Food Insecurity: No Food Insecurity (08/15/2022)   Hunger Vital Sign    Worried About Running Out of Food in the Last Year: Never true    Ran Out of  Food in the Last Year: Never true  Transportation Needs: No Transportation Needs (08/15/2022)   PRAPARE - Hydrologist (Medical): No    Lack of Transportation (Non-Medical): No  Physical Activity: Insufficiently Active (01/30/2021)   Exercise Vital Sign    Days of Exercise per Week: 2 days    Minutes of Exercise per Session: 10 min  Stress: No Stress Concern Present (01/30/2021)   [place]    Feeling of Stress : Only a little  Social Connections: Moderately Integrated (01/30/2021)   Social Connection and Isolation Panel [NHANES]    Frequency of Communication with Friends and Family: Twice a week    Frequency of Social Gatherings with Friends and Family: Twice a week    Attends Religious Services: More than 4 times per year    Active Member of [employer] or Organizations: No     Attends Archivist Meetings: Never    Marital Status: Married  Human resources officer Violence: Not At Risk (08/15/2022)   Humiliation, Afraid, Rape, and Kick questionnaire    Fear of Current or Ex-Partner: No    Emotionally Abused: No    Physically Abused: No    Sexually Abused: No   Family History  Problem Relation Age of Onset   Cancer Father    Heart disease Brother    Alcohol abuse Brother    Depression Maternal Grandmother    Depression Grandchild    Breast cancer Daughter       VITAL SIGNS BP 120/74   Pulse 72   Temp 98.6 F (37 C)   Resp 20   Ht '5\' 5"'$  (1.651 m)   Wt 178 lb 9.6 oz (81 kg)   SpO2 98%   BMI 29.72 kg/m   Outpatient Encounter Medications as of 12/30/2022  Medication Sig   acetaminophen (TYLENOL) 500 MG tablet Take 1,000 mg by mouth every 8 (eight) hours as needed for headache or mild pain.   apixaban (ELIQUIS) 5 MG TABS tablet Take 1 tablet (5 mg total) by mouth 2 (two) times daily.   ARIPiprazole (ABILIFY) 10 MG tablet Take 10 mg by mouth daily.   busPIRone (BUSPAR) 30 MG tablet Take 1 tablet (30 mg total) by mouth 2 (two) times daily.   Cholecalciferol (VITAMIN D) 125 MCG (5000 UT) CAPS Take 5,000 Units by mouth daily.   donepezil (ARICEPT) 10 MG tablet Take 10 mg by mouth at bedtime.   DULoxetine (CYMBALTA) 60 MG capsule Take 1 capsule (60 mg total) by mouth 2 (two) times daily.   eszopiclone (LUNESTA) 1 MG TABS tablet Take 1 tablet (1 mg total) by mouth at bedtime. Take immediately before bedtime   furosemide (LASIX) 20 MG tablet Take 20 mg by mouth.   LORazepam (ATIVAN) 0.5 MG tablet Take 0.5 tablets (0.25 mg total) by mouth 2 (two) times daily as needed for anxiety. (Patient not taking: Reported on 12/12/2022)   LORazepam (ATIVAN) 0.5 MG tablet Take 0.5 tablets (0.25 mg total) by mouth at bedtime. (Patient not taking: Reported on 12/12/2022)   metoprolol tartrate (LOPRESSOR) 25 MG tablet Take 1 tablet (25 mg total) by mouth 2 (two) times daily.    modafinil (PROVIGIL) 100 MG tablet Take 1 tablet (100 mg total) by mouth daily.   MYRBETRIQ 50 MG TB24 tablet Take 50 mg by mouth daily.   Nutritional Supplements (ENSURE ENLIVE PO) Take 1 Can by mouth 2 (two) times daily between meals.  polyethylene glycol (MIRALAX / GLYCOLAX) 17 g packet Take 17 g by mouth daily as needed.   senna (SENOKOT) 8.6 MG TABS tablet Take 1 tablet (8.6 mg total) by mouth 2 (two) times daily.   SUMAtriptan (IMITREX) 100 MG tablet Take 100 mg by mouth every 2 (two) hours as needed for migraine or headache. May repeat in 2 hours if headache persists or recurs.   UNABLE TO FIND Diet: Regular   valbenazine (INGREZZA) 80 MG capsule Take 80 mg by mouth daily.   ZINC OXIDE EX Apply 1 Application topically See admin instructions. every shift and prn Special Instructions: Apply to sacrum, bilateral buttocks and coccyx q shift for redness.   No facility-administered encounter medications on file as of 12/30/2022.     SIGNIFICANT DIAGNOSTIC EXAMS  PREVIOUS   08-16-22: chest x-ray: 1. No acute intrathoracic process.   08-21-22: ct of head:  1. Possible sulcal effacement and loss of gray-white differentiation in the posterior right frontal lobe may represent subacute infarct versus artifact. Consider further evaluation with MRI. 2. Mild chronic small vessel ischemia.  08-22-22: MRI head:  1. No acute intracranial abnormality. 2. Findings of chronic small vessel ischemia and volume loss.  NO NEW EXAMS   LABS REVIEWED PREVIOUS   08-04-22: wbc 6.9; hgb 10.1; hct 31.8; mcv 97.5 plt 371; glucose 93; bun 15; creat 0.66; k+ 4.0; na++ 135; ca 8.8; gfr >60 mag 1.8; tsh 1.460; free t3: 1.8; free t4: 1.02 08-14-22: wbc 9.3; hgb 10.5; hct 31.8 mcv 94.9 plt 324; glucose 93; bun 15 creat 0.66; k+ 3.6; na++ 138; ca 9.1; gfr >60 08-23-22: wbc 9.1; hgb 10.2; hct 31.8; mcv 94.9 plt 379'; glucose 100; bun 12; creat 0.61; k+ 4.3; na++ 139; ca 9.0; gfr >60 10-11-22: glucose 87; bun 16; creat  0.59; k+ 3.7; na++ 137; ca 9.4; gfr >60 10-17-22: glucose 98; bun 17; creat 0.56; k+ 4.1; na++ 138; ca 9.4; gfr >60 11-14-22: hepatitis C nr   NO NEW LABS.   Review of Systems  Constitutional:  Negative for malaise/fatigue.  Respiratory:  Negative for cough and shortness of breath.   Cardiovascular:  Negative for chest pain, palpitations and leg swelling.  Gastrointestinal:  Negative for abdominal pain, constipation and heartburn.  Musculoskeletal:  Negative for back pain, joint pain and myalgias.  Skin: Negative.   Neurological:  Negative for dizziness.  Psychiatric/Behavioral:  The patient is not nervous/anxious.     Physical Exam Constitutional:      General: She is not in acute distress.    Appearance: She is well-developed. She is not diaphoretic.  Neck:     Thyroid: No thyromegaly.  Cardiovascular:     Rate and Rhythm: Normal rate and regular rhythm.     Pulses: Normal pulses.     Heart sounds: Normal heart sounds.  Pulmonary:     Effort: Pulmonary effort is normal. No respiratory distress.     Breath sounds: Normal breath sounds.  Abdominal:     General: Bowel sounds are normal. There is no distension.     Palpations: Abdomen is soft.     Tenderness: There is no abdominal tenderness.  Musculoskeletal:     Cervical back: Neck supple.     Right lower leg: No edema.     Left lower leg: No edema.     Comments: Able to move all extremities   Lymphadenopathy:     Cervical: No cervical adenopathy.  Skin:    General: Skin is warm and dry.  Neurological:     Mental Status: She is alert and oriented to person, place, and time.     Comments: Does have abnormal chin and lip movements   Psychiatric:        Mood and Affect: Mood normal.        ASSESSMENT/ PLAN:  TODAY  Tardive dyskinesia: will stop ingrezza at this time due to cost; will begin benztropine (cogentin) 0.5 mg nightly and will monitor    Ok Edwards NP [facility]  call [phone]

## 2023-01-02 ENCOUNTER — Encounter: Payer: Self-pay | Admitting: Adult Health

## 2023-01-02 ENCOUNTER — Ambulatory Visit: Payer: PPO | Admitting: Internal Medicine

## 2023-01-07 ENCOUNTER — Encounter: Payer: Self-pay | Admitting: Internal Medicine

## 2023-01-07 ENCOUNTER — Ambulatory Visit: Payer: PPO | Attending: Internal Medicine | Admitting: Internal Medicine

## 2023-01-07 VITALS — BP 104/66 | HR 78 | Ht 65.0 in

## 2023-01-07 DIAGNOSIS — R079 Chest pain, unspecified: Secondary | ICD-10-CM | POA: Diagnosis not present

## 2023-01-07 DIAGNOSIS — I4819 Other persistent atrial fibrillation: Secondary | ICD-10-CM | POA: Diagnosis not present

## 2023-01-07 MED ORDER — OMEPRAZOLE 20 MG PO TBDD: 20.0000 mg | DELAYED_RELEASE_TABLET | Freq: Every day | ORAL | 3 refills | Status: DC | PRN

## 2023-01-07 NOTE — Patient Instructions (Signed)
Medication Instructions:  Your physician has recommended you make the following change in your medication:  -Start Omeprazole 20 mg tablets daily- AS NEEDED  *If you need a refill on your cardiac medications before your next appointment, please call your pharmacy*   Lab Work: None If you have labs (blood work) drawn today and your tests are completely normal, you will receive your results only by: [place] (if you have MyChart) OR A paper copy in the mail If you have any lab test that is abnormal or we need to change your treatment, we will call you to review the results.   Testing/Procedures: None   Follow-Up: At [facility], you and your health needs are our priority.  As part of our continuing mission to provide you with exceptional heart care, we have created designated Provider Care Teams.  These Care Teams include your primary Cardiologist (physician) and Advanced Practice Providers (APPs -  Physician Assistants and Nurse Practitioners) who all work together to provide you with the care you need, when you need it.  We recommend signing up for the patient portal called "MyChart".  Sign up information is provided on this After Visit Summary.  MyChart is used to connect with patients for Virtual Visits (Telemedicine).  Patients are able to view lab/test results, encounter notes, upcoming appointments, etc.  Non-urgent messages can be sent to your provider as well.   To learn more about what you can do with MyChart, go to [email or web address].    Your next appointment:   8 month(s)  Provider:   Claudina Lick, MD    Other Instructions

## 2023-01-07 NOTE — Progress Notes (Signed)
Cardiology Office Note  Date: 01/07/2023   ID: [name], DOB 29-May-1946, MRN [id number]  PCP:  Asencion Noble, MD  Cardiologist:  Chalmers Guest, MD Electrophysiologist:  None   Reason for Office Visit: Follow-up visit of A-fib   History of Present Illness: [name] is a 77 y.o. female known to have history of stroke, new onset of A-fib during 9/23 hospitalization postsurgery, GERD, cognitive impairment/dementia presented to cardiology clinic for follow-up visit. Accompanied by husband.  Patient's daughter preferred conservative management of atrial fibrillation and does not want any TEE/DCCV procedures due to medical comorbidities.  Patient was having palpitations since 07/2022 hospitalization and when I saw her in 09/2022, HR was in the range of 130 bpm. However her heart rates were controlled with medical management, metoprolol tartrate 25 mg twice daily. Currently takes Eliquis 5 mg twice daily.  No risk of falls. Cannot walk due to clubfoot.  She complains of substernal chest pains lasting for a few minutes, occurs at rest, frequency once every 2 weeks and resolves with deep breathing.  Last episode of chest pain was 1 month ago.  She thinks it could be from GERD.  Otherwise denies other symptoms like dizziness, lightheadedness, syncope, leg swelling, claudication DOE.  Past Medical History:  Diagnosis Date   Anemia    Arthritis    Asthma    Atrial fibrillation ([place])    COVID-19 08/05/2022   Depression    H/O head injury    Hearing loss    Heart murmur    can be heard at times, Doctor said not to worry   History of kidney stones    History of stomach ulcers    Migraine    Pneumonia    Psittacosis    Stroke ([place])    Weakness of neck 03/02/2013    Past Surgical History:  Procedure Laterality Date   ANKLE FRACTURE SURGERY Left    APPENDECTOMY     BREAST LUMPECTOMY     CESAREAN SECTION     x 3   I & D KNEE WITH POLY EXCHANGE Left 08/16/2022   Procedure:  KNEE POLY EXCHANGE;  Surgeon: Hiram Gash, MD;  Location: [facility];  Service: Orthopedics;  Laterality: Left;   KNEE ARTHROSCOPY WITH PATELLAR TENDON REPAIR Left 08/16/2022   Procedure: IRRIGATION AND DEBRIDEMENT LEFT KNEE REVISION WITH PATELLAR TENDON REPAIR and poly exchange;  Surgeon: Hiram Gash, MD;  Location: [facility];  Service: Orthopedics;  Laterality: Left;   PARTIAL HYSTERECTOMY     ROTATOR CUFF REPAIR Right    TOTAL KNEE ARTHROPLASTY Left 07/22/2022   Procedure: TOTAL KNEE ARTHROPLASTY;  Surgeon: [name], MD;  Location: [facility];  Service: Orthopedics;  Laterality: Left;    Current Outpatient Medications  Medication Sig Dispense Refill   acetaminophen (TYLENOL) 500 MG tablet Take 1,000 mg by mouth every 8 (eight) hours as needed for headache or mild pain.     apixaban (ELIQUIS) 5 MG TABS tablet Take 1 tablet (5 mg total) by mouth 2 (two) times daily. 60 tablet    ARIPiprazole (ABILIFY) 10 MG tablet Take 10 mg by mouth daily.     busPIRone (BUSPAR) 30 MG tablet Take 1 tablet (30 mg total) by mouth 2 (two) times daily. 180 tablet 0   Cholecalciferol (VITAMIN D) 125 MCG (5000 UT) CAPS Take 5,000 Units by mouth daily.     donepezil (ARICEPT) 10 MG tablet Take 10 mg by mouth at bedtime.  DULoxetine (CYMBALTA) 60 MG capsule Take 1 capsule (60 mg total) by mouth 2 (two) times daily. 180 capsule 1   eszopiclone (LUNESTA) 1 MG TABS tablet Take 1 tablet (1 mg total) by mouth at bedtime. Take immediately before bedtime 30 tablet 0   furosemide (LASIX) 20 MG tablet Take 20 mg by mouth.     LORazepam (ATIVAN) 0.5 MG tablet Take 0.5 tablets (0.25 mg total) by mouth 2 (two) times daily as needed for anxiety. 15 tablet 0   LORazepam (ATIVAN) 0.5 MG tablet Take 0.5 tablets (0.25 mg total) by mouth at bedtime. 15 tablet 0   metoprolol tartrate (LOPRESSOR) 25 MG tablet Take 1 tablet (25 mg total) by mouth 2 (two) times daily. 180 tablet 3   modafinil (PROVIGIL) 100 MG tablet Take 1 tablet  (100 mg total) by mouth daily. 30 tablet 0   MYRBETRIQ 50 MG TB24 tablet Take 50 mg by mouth daily.     Nutritional Supplements (ENSURE ENLIVE PO) Take 1 Can by mouth 2 (two) times daily between meals.     Omeprazole 20 MG TBDD Take 20 mg by mouth daily as needed. 30 tablet 3   polyethylene glycol (MIRALAX / GLYCOLAX) 17 g packet Take 17 g by mouth daily as needed. 14 each 0   senna (SENOKOT) 8.6 MG TABS tablet Take 1 tablet (8.6 mg total) by mouth 2 (two) times daily. 120 tablet 0   SUMAtriptan (IMITREX) 100 MG tablet Take 100 mg by mouth every 2 (two) hours as needed for migraine or headache. May repeat in 2 hours if headache persists or recurs.     UNABLE TO FIND Diet: Regular     valbenazine (INGREZZA) 80 MG capsule Take 80 mg by mouth daily.     ZINC OXIDE EX Apply 1 Application topically See admin instructions. every shift and prn Special Instructions: Apply to sacrum, bilateral buttocks and coccyx q shift for redness.     No current facility-administered medications for this visit.   Allergies:  Ticlid [ticlopidine], Nsaids, Prednisone, Trazodone and nefazodone, Monistat [miconazole], and Sulfa antibiotics   Social History: The patient  reports that she has never smoked. She has never used smokeless tobacco. She reports that she does not currently use alcohol. She reports that she does not use drugs.   Family History: The patient's family history includes Alcohol abuse in her brother; Breast cancer in her daughter; Cancer in her father; Depression in her grandchild and maternal grandmother; Heart disease in her brother.   ROS:  Please see the history of present illness. Otherwise, complete review of systems is positive for none.  All other systems are reviewed and negative.   Physical Exam: VS:  BP 104/66   Pulse 78   Ht '5\' 5"'$  (1.651 m)   BMI 29.72 kg/m , BMI Body mass index is 29.72 kg/m.  Wt Readings from Last 3 Encounters:  12/30/22 178 lb 9.6 oz (81 kg)  12/13/22 167 lb  12.8 oz (76.1 kg)  12/12/22 180 lb (81.6 kg)    General: Patient appears comfortable at rest. HEENT: Conjunctiva and lids normal, oropharynx clear with moist mucosa. Neck: Supple, no elevated JVP or carotid bruits, no thyromegaly. Lungs: Clear to auscultation, nonlabored breathing at rest. Cardiac: Regular rate and rhythm, no S3 or significant systolic murmur, no pericardial rub. Abdomen: Soft, nontender, no hepatomegaly, bowel sounds present, no guarding or rebound. Extremities: No pitting edema, distal pulses 2+. Skin: Warm and dry. Musculoskeletal: No kyphosis. Neuropsychiatric: Alert and oriented  x3, affect grossly appropriate.  ECG:  An ECG dated 09/24/2022 was personally reviewed today and demonstrated:  A-fib with RVR  Recent Labwork: 07/18/2022: ALT 15; AST 24 08/04/2022: Magnesium 1.9; TSH 1.490 08/15/2022: B Natriuretic Peptide 154.2 08/23/2022: Hemoglobin 10.1; Platelets 379 10/17/2022: BUN 17; Creatinine, Ser 0.56; Potassium 4.1; Sodium 138  No results found for: "CHOL", "TRIG", "HDL", "CHOLHDL", "VLDL", "LDLCALC", "LDLDIRECT"  Other Studies Reviewed Today: Echo 10/23 LVEF 55 to [id number] LV diastolic parameters are indeterminate RV systolic function is normal LA size is moderately dilated Trivial MR  Assessment and Plan: Patient is a 77 year old F known to have new onset of postop A-fib during 9/23 hospitalization, history of stroke presented to cardiology clinic for follow-up visit.  # Persistent atrial fibrillation, rate controlled -EKG today showed atrial fibrillation, rate controlled -Continue metoprolol tartrate 25 mg twice daily -Continue Eliquis 5 mg twice daily -Patient's daughter preferred conservative measures and not any TEE/DCCV procedure for rhythm/rate control.  # Chest pain -Patient has infrequent chest pains and will place her on omeprazole 20 mg daily as needed to rule out GERD related chest pain. Last episode of chest pain was 1 month ago.  If her chest  pains become more frequent in the future, she will benefit from stress testing.  # History of CVA -Not on aspirin due to Eliquis -Statin therapy discontinued due to cognitive/dementia in the past  I have spent a total of 32 minutes with patient reviewing chart  EKGs, labs and examining patient as well as establishing an assessment and plan that was discussed with the patient.  > 50% of time was spent in direct patient care.     Medication Adjustments/Labs and Tests Ordered: Current medicines are reviewed at length with the patient today.  Concerns regarding medicines are outlined above.   Tests Ordered: Orders Placed This Encounter  Procedures   EKG 12-Lead    Medication Changes: Meds ordered this encounter  Medications   Omeprazole 20 MG TBDD    Sig: Take 20 mg by mouth daily as needed.    Dispense:  30 tablet    Refill:  3    Disposition:  Follow up  8 months  Signed, Zarrah Loveland Fidel Levy, MD, 01/07/2023 11:49 AM    [facility]. [address], [place], [place] [address]

## 2023-01-10 ENCOUNTER — Encounter: Payer: Self-pay | Admitting: Adult Health

## 2023-01-10 ENCOUNTER — Non-Acute Institutional Stay (SKILLED_NURSING_FACILITY): Payer: PPO | Admitting: Adult Health

## 2023-01-10 ENCOUNTER — Other Ambulatory Visit: Payer: Self-pay | Admitting: Adult Health

## 2023-01-10 DIAGNOSIS — F0150 Vascular dementia without behavioral disturbance: Secondary | ICD-10-CM

## 2023-01-10 DIAGNOSIS — R29818 Other symptoms and signs involving the nervous system: Secondary | ICD-10-CM | POA: Diagnosis not present

## 2023-01-10 DIAGNOSIS — R4189 Other symptoms and signs involving cognitive functions and awareness: Secondary | ICD-10-CM | POA: Diagnosis not present

## 2023-01-10 DIAGNOSIS — G894 Chronic pain syndrome: Secondary | ICD-10-CM

## 2023-01-10 DIAGNOSIS — R600 Localized edema: Secondary | ICD-10-CM

## 2023-01-10 DIAGNOSIS — G2401 Drug induced subacute dyskinesia: Secondary | ICD-10-CM

## 2023-01-10 MED ORDER — VALBENAZINE TOSYLATE 80 MG PO CAPS: 80.0000 mg | ORAL_CAPSULE | Freq: Every day | ORAL | 12 refills | Status: DC

## 2023-01-10 NOTE — Progress Notes (Signed)
Location:  [facility] Room Number: [name] of Service:  SNF (31)   CODE STATUS: FULL CODE  Allergies  Allergen Reactions   Ticlid [Ticlopidine] Shortness Of Breath   Nsaids Other (See Comments)    Bleeding ulcer   Prednisone Other (See Comments)    Makes her stomach hurt--knows this isn't an allergy but doesn't want to take it    Trazodone And Nefazodone     hjallucinations   Monistat [Miconazole] Swelling and Rash   Sulfa Antibiotics Rash    Chief Complaint  Patient presents with   Medical Management of Chronic Issues                    Vascular dementia without behavioral disturbance/neurocognitive deficit:  Tardive dyskinesia: Chronic pain syndrome/periprosthetic fracture around internal prosthetic left knee joint sequela:  Bilateral lower extremity edema     HPI:  She is a 77 year old long term resident of this facility being seen for the management of her chronic illnesses: Vascular dementia without behavioral disturbance/neurocognitive deficit:  Tardive dyskinesia: Chronic pain syndrome/periprosthetic fracture around internal prosthetic left knee joint sequela:  Bilateral lower extremity edema. There are no reports of uncontrolled pain. She continues to have abnormal facial movements.   Past Medical History:  Diagnosis Date   Anemia    Arthritis    Asthma    Atrial fibrillation ([place])    COVID-19 08/05/2022   Depression    H/O head injury    Hearing loss    Heart murmur    can be heard at times, Doctor said not to worry   History of kidney stones    History of stomach ulcers    Migraine    Pneumonia    Psittacosis    Stroke ([place])    Weakness of neck 03/02/2013    Past Surgical History:  Procedure Laterality Date   ANKLE FRACTURE SURGERY Left    APPENDECTOMY     BREAST LUMPECTOMY     CESAREAN SECTION     x 3   I & D KNEE WITH POLY EXCHANGE Left 08/16/2022   Procedure: KNEE POLY EXCHANGE;  Surgeon: Hiram Gash, MD;   Location: [facility];  Service: Orthopedics;  Laterality: Left;   KNEE ARTHROSCOPY WITH PATELLAR TENDON REPAIR Left 08/16/2022   Procedure: IRRIGATION AND DEBRIDEMENT LEFT KNEE REVISION WITH PATELLAR TENDON REPAIR and poly exchange;  Surgeon: Hiram Gash, MD;  Location: [facility];  Service: Orthopedics;  Laterality: Left;   PARTIAL HYSTERECTOMY     ROTATOR CUFF REPAIR Right    TOTAL KNEE ARTHROPLASTY Left 07/22/2022   Procedure: TOTAL KNEE ARTHROPLASTY;  Surgeon: [name], MD;  [facility];  Service: Orthopedics;  Laterality: Left;    Social History   Socioeconomic History   Marital status: Married    Spouse name: Not on file   Number of children: 3   Years of education: Not on file   Highest education level: Not on file  Occupational History   Occupation: retired  Tobacco Use   Smoking status: Never   Smokeless tobacco: Never  Vaping Use   Vaping Use: Never used  Substance and Sexual Activity   Alcohol use: Not Currently    Comment: occ beer   Drug use: No   Sexual activity: Yes    Birth control/protection: Surgical    Comment: hyst  Other Topics Concern   Not on file  Social History Narrative   Not  on file   Social Determinants of Health   Financial Resource Strain: Medium Risk (01/30/2021)   Overall Financial Resource Strain (CARDIA)    Difficulty of Paying Living Expenses: Somewhat hard  Food Insecurity: No Food Insecurity (08/15/2022)   Hunger Vital Sign    Worried About Running Out of Food in the Last Year: Never true    Ran Out of Food in the Last Year: Never true  Transportation Needs: No Transportation Needs (08/15/2022)   PRAPARE - Hydrologist (Medical): No    Lack of Transportation (Non-Medical): No  Physical Activity: Insufficiently Active (01/30/2021)   Exercise Vital Sign    Days of Exercise per Week: 2 days    Minutes of Exercise per Session: 10 min  Stress: No Stress Concern Present (01/30/2021)   [place]    Feeling of Stress : Only a little  Social Connections: Moderately Integrated (01/30/2021)   Social Connection and Isolation Panel [NHANES]    Frequency of Communication with Friends and Family: Twice a week    Frequency of Social Gatherings with Friends and Family: Twice a week    Attends Religious Services: More than 4 times per year    Active Member of [employer] or Organizations: No    Attends Archivist Meetings: Never    Marital Status: Married  Human resources officer Violence: Not At Risk (08/15/2022)   Humiliation, Afraid, Rape, and Kick questionnaire    Fear of Current or Ex-Partner: No    Emotionally Abused: No    Physically Abused: No    Sexually Abused: No   Family History  Problem Relation Age of Onset   Cancer Father    Heart disease Brother    Alcohol abuse Brother    Depression Maternal Grandmother    Depression Grandchild    Breast cancer Daughter       VITAL SIGNS BP 107/60   Pulse 81   Temp (!) 97 F (36.1 C)   Resp 18   Ht '5\' 5"'$  (1.651 m)   Wt 178 lb 9.6 oz (81 kg)   SpO2 95%   BMI 29.72 kg/m   Outpatient Encounter Medications as of 01/10/2023  Medication Sig   acetaminophen (TYLENOL) 500 MG tablet Take 1,000 mg by mouth every 8 (eight) hours as needed for headache or mild pain.   apixaban (ELIQUIS) 5 MG TABS tablet Take 1 tablet (5 mg total) by mouth 2 (two) times daily.   ARIPiprazole (ABILIFY) 10 MG tablet Take 10 mg by mouth daily.   benztropine (COGENTIN) 0.5 MG tablet Take 0.5 mg by mouth daily.   busPIRone (BUSPAR) 30 MG tablet Take 1 tablet (30 mg total) by mouth 2 (two) times daily.   Cholecalciferol (VITAMIN D) 125 MCG (5000 UT) CAPS Take 5,000 Units by mouth daily.   donepezil (ARICEPT) 10 MG tablet Take 10 mg by mouth at bedtime.   DULoxetine (CYMBALTA) 60 MG capsule Take 1 capsule (60 mg total) by mouth 2 (two) times daily.   eszopiclone (LUNESTA) 1 MG TABS tablet Take 1 tablet  (1 mg total) by mouth at bedtime. Take immediately before bedtime   furosemide (LASIX) 20 MG tablet Take 20 mg by mouth.   memantine (NAMENDA XR) 7 MG CP24 24 hr capsule Take 7 mg by mouth daily.   metoprolol tartrate (LOPRESSOR) 25 MG tablet Take 1 tablet (25 mg total) by mouth 2 (two) times daily.   modafinil (PROVIGIL)  200 MG tablet Take 100 mg by mouth daily. 1/2 tablet daily   MYRBETRIQ 50 MG TB24 tablet Take 50 mg by mouth daily.   Nutritional Supplements (ENSURE ENLIVE PO) Take 1 Can by mouth 2 (two) times daily between meals.   Omeprazole 20 MG TBDD Take 20 mg by mouth daily as needed.   polyethylene glycol (MIRALAX / GLYCOLAX) 17 g packet Take 17 g by mouth daily as needed.   senna (SENOKOT) 8.6 MG TABS tablet Take 1 tablet (8.6 mg total) by mouth 2 (two) times daily.   SUMAtriptan (IMITREX) 100 MG tablet Take 100 mg by mouth every 2 (two) hours as needed for migraine or headache. May repeat in 2 hours if headache persists or recurs.   UNABLE TO FIND Diet: Regular   ZINC OXIDE EX Apply 1 Application topically See admin instructions. every shift and prn Special Instructions: Apply to sacrum, bilateral buttocks and coccyx q shift for redness.   [DISCONTINUED] modafinil (PROVIGIL) 100 MG tablet Take 1 tablet (100 mg total) by mouth daily. (Patient taking differently: Take 200 mg by mouth daily. 1/2 tablet daily)   [START ON 01/17/2023] memantine (NAMENDA XR) 14 MG CP24 24 hr capsule Take 14 mg by mouth. (Patient not taking: Reported on 01/10/2023)   [START ON 01/24/2023] memantine (NAMENDA XR) 21 MG CP24 24 hr capsule Take 21 mg by mouth. (Patient not taking: Reported on 01/10/2023)   [START ON 01/31/2023] memantine (NAMENDA XR) 28 MG CP24 24 hr capsule Take 28 mg by mouth daily. (Patient not taking: Reported on 01/10/2023)   [DISCONTINUED] LORazepam (ATIVAN) 0.5 MG tablet Take 0.5 tablets (0.25 mg total) by mouth 2 (two) times daily as needed for anxiety.   [DISCONTINUED] LORazepam (ATIVAN) 0.5 MG  tablet Take 0.5 tablets (0.25 mg total) by mouth at bedtime.   [DISCONTINUED] valbenazine (INGREZZA) 80 MG capsule Take 80 mg by mouth daily.   No facility-administered encounter medications on file as of 01/10/2023.     SIGNIFICANT DIAGNOSTIC EXAMS   PREVIOUS   08-16-22: chest x-ray: 1. No acute intrathoracic process.   08-21-22: ct of head:  1. Possible sulcal effacement and loss of gray-white differentiation in the posterior right frontal lobe may represent subacute infarct versus artifact. Consider further evaluation with MRI. 2. Mild chronic small vessel ischemia.  08-22-22: MRI head:  1. No acute intracranial abnormality. 2. Findings of chronic small vessel ischemia and volume loss.  NO NEW EXAMS   LABS REVIEWED PREVIOUS   08-04-22: wbc 6.9; hgb 10.1; hct 31.8; mcv 97.5 plt 371; glucose 93; bun 15; creat 0.66; k+ 4.0; na++ 135; ca 8.8; gfr >60 mag 1.8; tsh 1.460; free t3: 1.8; free t4: 1.02 08-14-22: wbc 9.3; hgb 10.5; hct 31.8 mcv 94.9 plt 324; glucose 93; bun 15 creat 0.66; k+ 3.6; na++ 138; ca 9.1; gfr >60 08-23-22: wbc 9.1; hgb 10.2; hct 31.8; mcv 94.9 plt 379'; glucose 100; bun 12; creat 0.61; k+ 4.3; na++ 139; ca 9.0; gfr >60 10-11-22: glucose 87; bun 16; creat 0.59; k+ 3.7; na++ 137; ca 9.4; gfr >60 10-17-22: glucose 98; bun 17; creat 0.56; k+ 4.1; na++ 138; ca 9.4; gfr >60 11-14-22: hepatitis C nr  NO NEW LABS    Review of Systems  Constitutional:  Negative for malaise/fatigue.  Respiratory:  Negative for cough and shortness of breath.   Cardiovascular:  Negative for chest pain, palpitations and leg swelling.  Gastrointestinal:  Negative for abdominal pain, constipation and heartburn.  Musculoskeletal:  Negative for back pain,  joint pain and myalgias.  Skin: Negative.   Neurological:  Negative for dizziness.  Psychiatric/Behavioral:  The patient is not nervous/anxious.    Physical Exam Constitutional:      General: She is not in acute distress.    Appearance: She  is well-developed. She is not diaphoretic.  Neck:     Thyroid: No thyromegaly.  Cardiovascular:     Rate and Rhythm: Normal rate and regular rhythm.     Pulses: Normal pulses.     Heart sounds: Normal heart sounds.  Pulmonary:     Effort: Pulmonary effort is normal. No respiratory distress.     Breath sounds: Normal breath sounds.  Abdominal:     General: Bowel sounds are normal. There is no distension.     Palpations: Abdomen is soft.     Tenderness: There is no abdominal tenderness.  Musculoskeletal:     Cervical back: Neck supple.     Right lower leg: No edema.     Left lower leg: No edema.     Comments: Able to move all extremities   Lymphadenopathy:     Cervical: No cervical adenopathy.  Skin:    General: Skin is warm and dry.  Neurological:     Mental Status: She is alert. Mental status is at baseline.     Comments: Does have abnormal chin and lip movements   Psychiatric:        Mood and Affect: Mood normal.       ASSESSMENT/ PLAN:  TODAY  Vascular dementia without behavioral disturbance/neurocognitive deficit: weight is 178 pounds; will continue aricept 10 mg nightly   2. Tardive dyskinesia: does continue to abnormal facial movements; will continue cogentin 0.5 mg nightly is off ingrezza due to financial concerns   3. Chronic pain syndrome/periprosthetic fracture around internal prosthetic left knee joint sequela: is taking cymbalta 60 mg twice daily   4. Bilateral lower extremity edema: will monitor   PREVIOUS   5. Chronic anemia: hgb 10.2  6. Chronic insomnia: will continue lunesta 1 mg nightly   7. Psychosis in elderly / major depression with psychotic features; at times feels as though she is in the basement. Will continue abilify 10 mg daily; cymbalta 60 mg twice daily buspar 30 mg twice daily  8. Somnolence: will continue provigil 200 mg daily   9. Protein calorie malnutrition will continue ensure twice daily   10. Urinary incontinence: will  continue myrbetriq 50 mg daily  11. Longstanding persistent atrial fibrillation/history of cva/history of dvt: heart rate is stable will continue eliquis 5 mg twice daily take lopressor 25 mg twice daily for rate control   12.  Migraine without status migrainosus; non-intractable unspecified type: will continue imitrex 100 mg twice daily as needed   13. Uncomplicated asthma unspecified asthma severity unspecified; unspecified whether persistent: is stable  14. Chronic constipation: will continue senna twice daily and has miralax daily as needed       Ok Edwards NP [facility]   call [phone]

## 2023-01-13 DIAGNOSIS — F332 Major depressive disorder, recurrent severe without psychotic features: Secondary | ICD-10-CM | POA: Diagnosis not present

## 2023-01-13 DIAGNOSIS — G2401 Drug induced subacute dyskinesia: Secondary | ICD-10-CM | POA: Diagnosis not present

## 2023-01-13 DIAGNOSIS — M79674 Pain in right toe(s): Secondary | ICD-10-CM | POA: Diagnosis not present

## 2023-01-13 DIAGNOSIS — F411 Generalized anxiety disorder: Secondary | ICD-10-CM | POA: Diagnosis not present

## 2023-01-13 DIAGNOSIS — I739 Peripheral vascular disease, unspecified: Secondary | ICD-10-CM | POA: Diagnosis not present

## 2023-01-13 DIAGNOSIS — B351 Tinea unguium: Secondary | ICD-10-CM | POA: Diagnosis not present

## 2023-01-13 DIAGNOSIS — M79675 Pain in left toe(s): Secondary | ICD-10-CM | POA: Diagnosis not present

## 2023-01-14 DIAGNOSIS — M170 Bilateral primary osteoarthritis of knee: Secondary | ICD-10-CM | POA: Diagnosis not present

## 2023-01-23 ENCOUNTER — Encounter: Payer: Self-pay | Admitting: Adult Health

## 2023-01-23 ENCOUNTER — Non-Acute Institutional Stay (SKILLED_NURSING_FACILITY): Payer: PPO | Admitting: Adult Health

## 2023-01-23 DIAGNOSIS — G2401 Drug induced subacute dyskinesia: Secondary | ICD-10-CM

## 2023-01-23 DIAGNOSIS — F323 Major depressive disorder, single episode, severe with psychotic features: Secondary | ICD-10-CM | POA: Diagnosis not present

## 2023-01-23 NOTE — Progress Notes (Signed)
Location:  [facility] Room Number: [place] of Service:  SNF (31)   CODE STATUS: Full Code   Allergies  Allergen Reactions   Ticlid [Ticlopidine] Shortness Of Breath   Nsaids Other (See Comments)    Bleeding ulcer   Prednisone Other (See Comments)    Makes her stomach hurt--knows this isn't an allergy but doesn't want to take it    Trazodone And Nefazodone     hjallucinations   Monistat [Miconazole] Swelling and Rash   Sulfa Antibiotics Rash    Chief Complaint  Patient presents with   Acute Visit    Mood concerns     HPI:  The staff is expressing issues with her mood state. She has stated that she feels depressed. There are no reports of pain; no reports of delusions or hallucination. She is presently taking cymbalta 60 mg twice daily and abilify 10 mg daily. The concern is that her cymbalta may not be effective for her mood state   Past Medical History:  Diagnosis Date   Anemia    Arthritis    Asthma    Atrial fibrillation ([place])    COVID-19 08/05/2022   Depression    H/O head injury    Hearing loss    Heart murmur    can be heard at times, Doctor said not to worry   History of kidney stones    History of stomach ulcers    Migraine    Pneumonia    Psittacosis    Stroke ([place])    Weakness of neck 03/02/2013    Past Surgical History:  Procedure Laterality Date   ANKLE FRACTURE SURGERY Left    APPENDECTOMY     BREAST LUMPECTOMY     CESAREAN SECTION     x 3   I & D KNEE WITH POLY EXCHANGE Left 08/16/2022   Procedure: KNEE POLY EXCHANGE;  Surgeon: Hiram Gash, MD;  Location: [facility];  Service: Orthopedics;  Laterality: Left;   KNEE ARTHROSCOPY WITH PATELLAR TENDON REPAIR Left 08/16/2022   Procedure: IRRIGATION AND DEBRIDEMENT LEFT KNEE REVISION WITH PATELLAR TENDON REPAIR and poly exchange;  Surgeon: Hiram Gash, MD;  Location: [facility];  Service: Orthopedics;  Laterality: Left;   PARTIAL HYSTERECTOMY     ROTATOR CUFF REPAIR Right     TOTAL KNEE ARTHROPLASTY Left 07/22/2022   Procedure: TOTAL KNEE ARTHROPLASTY;  Surgeon: [name], MD;  Location: [facility];  Service: Orthopedics;  Laterality: Left;    Social History   Socioeconomic History   Marital status: Married    Spouse name: Not on file   Number of children: 3   Years of education: Not on file   Highest education level: Not on file  Occupational History   Occupation: retired  Tobacco Use   Smoking status: Never   Smokeless tobacco: Never  Vaping Use   Vaping Use: Never used  Substance and Sexual Activity   Alcohol use: Not Currently    Comment: occ beer   Drug use: No   Sexual activity: Yes    Birth control/protection: Surgical    Comment: hyst  Other Topics Concern   Not on file  Social History Narrative   Not on file   Social Determinants of Health   Financial Resource Strain: Medium Risk (01/30/2021)   Overall Financial Resource Strain (CARDIA)    Difficulty of Paying Living Expenses: Somewhat hard  Food Insecurity: No Food Insecurity (08/15/2022)   Hunger Vital Sign  Worried About Charity fundraiser in the Last Year: Never true    [place] in the Last Year: Never true  Transportation Needs: No Transportation Needs (08/15/2022)   PRAPARE - Hydrologist (Medical): No    Lack of Transportation (Non-Medical): No  Physical Activity: Insufficiently Active (01/30/2021)   Exercise Vital Sign    Days of Exercise per Week: 2 days    Minutes of Exercise per Session: 10 min  Stress: No Stress Concern Present (01/30/2021)   [place]    Feeling of Stress : Only a little  Social Connections: Moderately Integrated (01/30/2021)   Social Connection and Isolation Panel [NHANES]    Frequency of Communication with Friends and Family: Twice a week    Frequency of Social Gatherings with Friends and Family: Twice a week    Attends Religious Services:  More than 4 times per year    Active Member of [employer] or Organizations: No    Attends Archivist Meetings: Never    Marital Status: Married  Human resources officer Violence: Not At Risk (08/15/2022)   Humiliation, Afraid, Rape, and Kick questionnaire    Fear of Current or Ex-Partner: No    Emotionally Abused: No    Physically Abused: No    Sexually Abused: No   Family History  Problem Relation Age of Onset   Cancer Father    Heart disease Brother    Alcohol abuse Brother    Depression Maternal Grandmother    Depression Grandchild    Breast cancer Daughter       VITAL SIGNS BP (!) 114/58   Pulse 65   Temp 97.9 F (36.6 C)   Resp 16   Ht 5\' 5"  (1.651 m)   Wt 179 lb (81.2 kg)   SpO2 100%   BMI 29.79 kg/m   Outpatient Encounter Medications as of 01/23/2023  Medication Sig   acetaminophen (TYLENOL) 500 MG tablet Take 1,000 mg by mouth every 8 (eight) hours as needed for headache or mild pain.   apixaban (ELIQUIS) 5 MG TABS tablet Take 1 tablet (5 mg total) by mouth 2 (two) times daily.   ARIPiprazole (ABILIFY) 10 MG tablet Take 10 mg by mouth daily.   busPIRone (BUSPAR) 30 MG tablet Take 1 tablet (30 mg total) by mouth 2 (two) times daily.   Cholecalciferol (VITAMIN D) 125 MCG (5000 UT) CAPS Take 5,000 Units by mouth daily.   donepezil (ARICEPT) 10 MG tablet Take 10 mg by mouth at bedtime.   eszopiclone (LUNESTA) 1 MG TABS tablet Take 1 tablet (1 mg total) by mouth at bedtime. Take immediately before bedtime   furosemide (LASIX) 20 MG tablet Take 20 mg by mouth.   [START ON 01/31/2023] memantine (NAMENDA XR) 28 MG CP24 24 hr capsule Take 28 mg by mouth daily.   metoprolol tartrate (LOPRESSOR) 25 MG tablet Take 1 tablet (25 mg total) by mouth 2 (two) times daily.   modafinil (PROVIGIL) 200 MG tablet Take 100 mg by mouth daily.   MYRBETRIQ 50 MG TB24 tablet Take 50 mg by mouth daily.   NUTRITIONAL SUPPLEMENTS PO Take 120 mLs by mouth 2 (two) times daily between meals. Med  Pass   Omeprazole 20 MG TBDD Take 20 mg by mouth daily as needed.   polyethylene glycol (MIRALAX / GLYCOLAX) 17 g packet Take 17 g by mouth daily as needed.   senna (SENOKOT) 8.6 MG TABS  tablet Take 1 tablet (8.6 mg total) by mouth 2 (two) times daily.   SUMAtriptan (IMITREX) 100 MG tablet Take 100 mg by mouth every 2 (two) hours as needed for migraine or headache. May repeat in 2 hours if headache persists or recurs.   UNABLE TO FIND Diet: Regular   valbenazine (INGREZZA) 80 MG capsule Take 1 capsule (80 mg total) by mouth daily.   ZINC OXIDE EX Apply 1 Application topically See admin instructions. every shift and prn Special Instructions: Apply to sacrum, bilateral buttocks and coccyx q shift for redness.   [DISCONTINUED] benztropine (COGENTIN) 0.5 MG tablet Take 0.5 mg by mouth daily.   [DISCONTINUED] DULoxetine (CYMBALTA) 60 MG capsule Take 1 capsule (60 mg total) by mouth 2 (two) times daily.   [DISCONTINUED] memantine (NAMENDA XR) 14 MG CP24 24 hr capsule Take 14 mg by mouth. (Patient not taking: Reported on 01/10/2023)   [DISCONTINUED] memantine (NAMENDA XR) 21 MG CP24 24 hr capsule Take 21 mg by mouth. (Patient not taking: Reported on 01/10/2023)   [DISCONTINUED] Nutritional Supplements (ENSURE ENLIVE PO) Take 1 Can by mouth 2 (two) times daily between meals.   No facility-administered encounter medications on file as of 01/23/2023.     SIGNIFICANT DIAGNOSTIC EXAMS  PREVIOUS   08-16-22: chest x-ray: 1. No acute intrathoracic process.   08-21-22: ct of head:  1. Possible sulcal effacement and loss of gray-white differentiation in the posterior right frontal lobe may represent subacute infarct versus artifact. Consider further evaluation with MRI. 2. Mild chronic small vessel ischemia.  08-22-22: MRI head:  1. No acute intracranial abnormality. 2. Findings of chronic small vessel ischemia and volume loss.  NO NEW EXAMS   LABS REVIEWED PREVIOUS   08-04-22: wbc 6.9; hgb 10.1; hct  31.8; mcv 97.5 plt 371; glucose 93; bun 15; creat 0.66; k+ 4.0; na++ 135; ca 8.8; gfr >60 mag 1.8; tsh 1.460; free t3: 1.8; free t4: 1.02 08-14-22: wbc 9.3; hgb 10.5; hct 31.8 mcv 94.9 plt 324; glucose 93; bun 15 creat 0.66; k+ 3.6; na++ 138; ca 9.1; gfr >60 08-23-22: wbc 9.1; hgb 10.2; hct 31.8; mcv 94.9 plt 379'; glucose 100; bun 12; creat 0.61; k+ 4.3; na++ 139; ca 9.0; gfr >60 10-11-22: glucose 87; bun 16; creat 0.59; k+ 3.7; na++ 137; ca 9.4; gfr >60 10-17-22: glucose 98; bun 17; creat 0.56; k+ 4.1; na++ 138; ca 9.4; gfr >60 11-14-22: hepatitis C nr  NO NEW LABS    Review of Systems  Constitutional:  Negative for malaise/fatigue.  Respiratory:  Negative for cough and shortness of breath.   Cardiovascular:  Negative for chest pain, palpitations and leg swelling.  Gastrointestinal:  Negative for abdominal pain, constipation and heartburn.  Musculoskeletal:  Negative for back pain, joint pain and myalgias.  Skin: Negative.   Neurological:  Negative for dizziness.  Psychiatric/Behavioral:  Positive for depression. Negative for suicidal ideas. The patient is nervous/anxious and has insomnia.    Physical Exam Constitutional:      General: She is not in acute distress.    Appearance: She is well-developed. She is not diaphoretic.  Neck:     Thyroid: No thyromegaly.  Cardiovascular:     Rate and Rhythm: Normal rate and regular rhythm.     Pulses: Normal pulses.     Heart sounds: Normal heart sounds.  Pulmonary:     Effort: Pulmonary effort is normal. No respiratory distress.     Breath sounds: Normal breath sounds.  Abdominal:     General:  Bowel sounds are normal. There is no distension.     Palpations: Abdomen is soft.     Tenderness: There is no abdominal tenderness.  Musculoskeletal:     Cervical back: Neck supple.     Right lower leg: No edema.     Left lower leg: No edema.     Comments: Moves all extremities   Lymphadenopathy:     Cervical: No cervical adenopathy.  Skin:     General: Skin is warm and dry.  Neurological:     Mental Status: She is alert. Mental status is at baseline.     Comments:  Does have some minor abnormal chin and lip movements  Psychiatric:        Mood and Affect: Mood normal.       ASSESSMENT/ PLAN:  TODAY  Tardive dyskinesia Major depression with psychotic features.   Will lower cymbalta to 60 mg daily for one week then 30 mg daily for one week then stop On 02-01-23: will begin trintellex 10 mg daily  Will monitor her status.    Ok Edwards NP [facility]   call [phone]

## 2023-01-24 ENCOUNTER — Telehealth: Payer: Self-pay | Admitting: Psychiatry

## 2023-01-24 NOTE — Telephone Encounter (Signed)
Pt currently in rehab [facility]. Schedule appt with M[facility] @ [phone] while there.

## 2023-01-31 DIAGNOSIS — M159 Polyosteoarthritis, unspecified: Secondary | ICD-10-CM | POA: Diagnosis not present

## 2023-01-31 DIAGNOSIS — R279 Unspecified lack of coordination: Secondary | ICD-10-CM | POA: Diagnosis not present

## 2023-01-31 DIAGNOSIS — M6281 Muscle weakness (generalized): Secondary | ICD-10-CM | POA: Diagnosis not present

## 2023-01-31 DIAGNOSIS — Z471 Aftercare following joint replacement surgery: Secondary | ICD-10-CM | POA: Diagnosis not present

## 2023-01-31 DIAGNOSIS — I639 Cerebral infarction, unspecified: Secondary | ICD-10-CM | POA: Diagnosis not present

## 2023-02-04 ENCOUNTER — Encounter: Payer: Self-pay | Admitting: Neurology

## 2023-02-04 ENCOUNTER — Other Ambulatory Visit: Payer: Self-pay

## 2023-02-04 DIAGNOSIS — R202 Paresthesia of skin: Secondary | ICD-10-CM

## 2023-02-05 ENCOUNTER — Ambulatory Visit: Payer: PPO | Admitting: Psychiatry

## 2023-02-06 ENCOUNTER — Non-Acute Institutional Stay (SKILLED_NURSING_FACILITY): Payer: PPO | Admitting: Adult Health

## 2023-02-06 ENCOUNTER — Encounter: Payer: Self-pay | Admitting: Adult Health

## 2023-02-06 DIAGNOSIS — Z0000 Encounter for general adult medical examination without abnormal findings: Secondary | ICD-10-CM

## 2023-02-06 NOTE — Progress Notes (Signed)
Subjective:   [name] is a 77 y.o. female who presents for Medicare Annual (Subsequent) preventive examination.  Review of Systems    Review of Systems  Constitutional:  Negative for malaise/fatigue.  Respiratory:  Negative for cough and shortness of breath.   Cardiovascular:  Negative for chest pain, palpitations and leg swelling.  Gastrointestinal:  Negative for abdominal pain, constipation and heartburn.  Musculoskeletal:  Negative for back pain, joint pain and myalgias.  Skin: Negative.   Neurological:  Negative for dizziness.  Psychiatric/Behavioral:  The patient is not nervous/anxious.     Cardiac Risk Factors include: advanced age (>24men, >70 women);sedentary lifestyle     Objective:    Today's Vitals   02/06/23 1357  BP: (!) 109/50  Pulse: 63  Resp: 18  Temp: 97.9 F (36.6 C)  SpO2: 95%  Weight: 176 lb 6 oz (80 kg)  Height: 5\' 5"  (1.651 m)   Body mass index is 29.35 kg/m.     02/06/2023    2:22 PM 01/23/2023   12:12 PM 01/10/2023   10:14 AM 12/13/2022    8:47 AM 12/12/2022   11:02 AM 11/08/2022    9:01 AM 11/06/2022   10:57 AM  Advanced Directives  Does Patient Have a Medical Advance Directive? Yes Yes Yes Yes Yes No No  Type of Advance Directive Out of facility DNR (pink MOST or yellow form) Out of facility DNR (pink MOST or yellow form) Out of facility DNR (pink MOST or yellow form) Out of facility DNR (pink MOST or yellow form) Out of facility DNR (pink MOST or yellow form)    Does patient want to make changes to medical advance directive? No - Patient declined No - Patient declined  No - Patient declined No - Patient declined    Would patient like information on creating a medical advance directive?      No - Patient declined No - Patient declined  Pre-existing out of facility DNR order (yellow form or pink MOST form) Pink MOST form placed in chart (order not valid for inpatient use) Pink MOST form placed in chart (order not valid for inpatient use) Pink  MOST form placed in chart (order not valid for inpatient use) Pink MOST form placed in chart (order not valid for inpatient use) Pink MOST form placed in chart (order not valid for inpatient use)      Current Medications (verified) Outpatient Encounter Medications as of 02/06/2023  Medication Sig   acetaminophen (TYLENOL) 500 MG tablet Take 1,000 mg by mouth every 8 (eight) hours as needed for headache or mild pain.   apixaban (ELIQUIS) 5 MG TABS tablet Take 1 tablet (5 mg total) by mouth 2 (two) times daily.   ARIPiprazole (ABILIFY) 10 MG tablet Take 10 mg by mouth daily.   busPIRone (BUSPAR) 30 MG tablet Take 1 tablet (30 mg total) by mouth 2 (two) times daily.   Cholecalciferol (VITAMIN D) 125 MCG (5000 UT) CAPS Take 5,000 Units by mouth daily.   donepezil (ARICEPT) 10 MG tablet Take 10 mg by mouth at bedtime.   eszopiclone (LUNESTA) 1 MG TABS tablet Take 1 tablet (1 mg total) by mouth at bedtime. Take immediately before bedtime   furosemide (LASIX) 20 MG tablet Take 20 mg by mouth.   memantine (NAMENDA XR) 28 MG CP24 24 hr capsule Take 28 mg by mouth daily.   metoprolol tartrate (LOPRESSOR) 25 MG tablet Take 1 tablet (25 mg total) by mouth 2 (two) times daily.  modafinil (PROVIGIL) 200 MG tablet Take 100 mg by mouth daily.   MYRBETRIQ 50 MG TB24 tablet Take 50 mg by mouth daily.   NUTRITIONAL SUPPLEMENTS PO Take 120 mLs by mouth 2 (two) times daily between meals. Med Pass   Omeprazole 20 MG TBDD Take 20 mg by mouth daily as needed.   polyethylene glycol (MIRALAX / GLYCOLAX) 17 g packet Take 17 g by mouth daily as needed.   senna (SENOKOT) 8.6 MG TABS tablet Take 1 tablet (8.6 mg total) by mouth 2 (two) times daily.   SUMAtriptan (IMITREX) 100 MG tablet Take 100 mg by mouth every 2 (two) hours as needed for migraine or headache. May repeat in 2 hours if headache persists or recurs.   UNABLE TO FIND Diet: Regular   valbenazine (INGREZZA) 80 MG capsule Take 1 capsule (80 mg total) by mouth  daily.   vortioxetine HBr (TRINTELLIX) 10 MG TABS tablet Take 10 mg by mouth daily.   ZINC OXIDE EX Apply 1 Application topically See admin instructions. every shift and prn Special Instructions: Apply to sacrum, bilateral buttocks and coccyx q shift for redness.   No facility-administered encounter medications on file as of 02/06/2023.    Allergies (verified) Ticlid [ticlopidine], Nsaids, Prednisone, Trazodone and nefazodone, Monistat [miconazole], and Sulfa antibiotics   History: Past Medical History:  Diagnosis Date   Anemia    Arthritis    Asthma    Atrial fibrillation ([place])    COVID-19 08/05/2022   Depression    H/O head injury    Hearing loss    Heart murmur    can be heard at times, Doctor said not to worry   History of kidney stones    History of stomach ulcers    Migraine    Pneumonia    Psittacosis    Stroke ([place])    Weakness of neck 03/02/2013   Past Surgical History:  Procedure Laterality Date   ANKLE FRACTURE SURGERY Left    APPENDECTOMY     BREAST LUMPECTOMY     CESAREAN SECTION     x 3   I & D KNEE WITH POLY EXCHANGE Left 08/16/2022   Procedure: KNEE POLY EXCHANGE;  Surgeon: Hiram Gash, MD;  Location: [place];  Service: Orthopedics;  Laterality: Left;   KNEE ARTHROSCOPY WITH PATELLAR TENDON REPAIR Left 08/16/2022   Procedure: IRRIGATION AND DEBRIDEMENT LEFT KNEE REVISION WITH PATELLAR TENDON REPAIR and poly exchange;  Surgeon: Hiram Gash, MD;  Location: [place];  Service: Orthopedics;  Laterality: Left;   PARTIAL HYSTERECTOMY     ROTATOR CUFF REPAIR Right    TOTAL KNEE ARTHROPLASTY Left 07/22/2022   Procedure: TOTAL KNEE ARTHROPLASTY;  Surgeon: [name], MD;  Location: [facility];  Service: Orthopedics;  Laterality: Left;   Family History  Problem Relation Age of Onset   Cancer Father    Heart disease Brother    Alcohol abuse Brother    Depression Maternal Grandmother    Depression Grandchild    Breast cancer Daughter    Social History    Socioeconomic History   Marital status: Married    Spouse name: Not on file   Number of children: 3   Years of education: Not on file   Highest education level: Not on file  Occupational History   Occupation: retired  Tobacco Use   Smoking status: Never   Smokeless tobacco: Never  Vaping Use   Vaping Use: Never used  Substance and Sexual Activity  Alcohol use: Not Currently    Comment: occ beer   Drug use: No   Sexual activity: Yes    Birth control/protection: Surgical    Comment: hyst  Other Topics Concern   Not on file  Social History Narrative   Not on file   Social Determinants of Health   Financial Resource Strain: Medium Risk (01/30/2021)   Overall Financial Resource Strain (CARDIA)    Difficulty of Paying Living Expenses: Somewhat hard  Food Insecurity: No Food Insecurity (08/15/2022)   Hunger Vital Sign    Worried About Running Out of Food in the Last Year: Never true    Ran Out of Food in the Last Year: Never true  Transportation Needs: No Transportation Needs (08/15/2022)   PRAPARE - Hydrologist (Medical): No    Lack of Transportation (Non-Medical): No  Physical Activity: Insufficiently Active (01/30/2021)   Exercise Vital Sign    Days of Exercise per Week: 2 days    Minutes of Exercise per Session: 10 min  Stress: No Stress Concern Present (01/30/2021)   [place]    Feeling of Stress : Only a little  Social Connections: Moderately Integrated (01/30/2021)   Social Connection and Isolation Panel [NHANES]    Frequency of Communication with Friends and Family: Twice a week    Frequency of Social Gatherings with Friends and Family: Twice a week    Attends Religious Services: More than 4 times per year    Active Member of [employer] or Organizations: No    Attends Music therapist: Never    Marital Status: Married    Tobacco Counseling Counseling given:  Not Answered   Clinical Intake:  Pre-visit preparation completed: Yes  Pain : No/denies pain     BMI - recorded: 29.35 Nutritional Status: BMI 25 -29 Overweight Nutritional Risks: Unintentional weight gain Diabetes: No  How often do you need to have someone help you when you read instructions, pamphlets, or other written materials from your doctor or pharmacy?: [place] Needed?: No      Activities of Daily Living    02/06/2023    3:21 PM 10/11/2022    1:45 PM  In your present state of health, do you have any difficulty performing the following activities:  Hearing? 0   Vision? 0   Difficulty concentrating or making decisions? 1   Walking or climbing stairs? 1   Dressing or bathing? 1   Doing errands, shopping? 1 0  Preparing Food and eating ? Y   Using the Toilet? Y   In the past six months, have you accidently leaked urine? Y   Do you have problems with loss of bowel control? Y   Managing your Medications? Y   Managing your Finances? Y   Housekeeping or managing your Housekeeping? Y     Patient Care Team: Gerlene Fee, NP as PCP - General (Geriatric Medicine) Chalmers Guest, MD as PCP - Cardiology (Cardiology) Jodi Marble, MD as Consulting Physician (Otolaryngology)  Indicate any recent Medical Services you may have received from other than [facility] providers in the past year (date may be approximate).     Assessment:   This is a routine wellness examination for Danielys.  Hearing/Vision screen No results found.  Dietary issues and exercise activities discussed: Current Exercise Habits: The patient does not participate in regular exercise at present, Exercise limited by: None identified  Goals Addressed             This Visit's Progress    Absence of Fall and Fall-Related Injury       Evidence-based guidance:  Assess fall risk using a validated tool when available. Consider balance and gait impairment, muscle  weakness, diminished vision or hearing, environmental hazards, presence of urinary or bowel urgency and/or incontinence.  Communicate fall injury risk to interprofessional healthcare team.  Develop a fall prevention plan with the patient and family.  Promote use of personal vision and auditory aids.  Promote reorientation, appropriate sensory stimulation, and routines to decrease risk of fall when changes in mental status are present.  Assess assistance level required for safe and effective self-care; consider referral for home care.  Encourage physical activity, such as performance of self-care at highest level of ability, strength and balance exercise program, and provision of appropriate assistive devices; refer to rehabilitation therapy.  Refer to community-based fall prevention program where available.  If fall occurs, determine the cause and revise fall injury prevention plan.  Regularly review medication contribution to fall risk; consider risk related to polypharmacy and age.  Refer to pharmacist for consultation when concerns about medications are revealed.  Balance adequate pain management with potential for oversedation.  Provide guidance related to environmental modifications.  Consider supplementation with Vitamin D.   Notes:      DIET - INCREASE WATER INTAKE       General - Client will not be readmitted within 30 days ([facility]-SNP)         Depression Screen    02/06/2023    3:20 PM 02/06/2023    2:19 PM 12/12/2022    1:57 PM 11/12/2022    1:32 PM 11/12/2022    1:31 PM 01/30/2021   11:55 AM 05/11/2019   11:35 AM  PHQ 2/9 Scores  PHQ - 2 Score 0 0 0 1 0 2 1  PHQ- 9 Score 1 0 0 3  6     Fall Risk    02/06/2023    3:20 PM 02/06/2023    2:20 PM 12/13/2022    8:49 AM 12/12/2022    1:57 PM 08/30/2022    3:24 PM  Fall Risk   Falls in the past year? 1 0 1 1 1   Number falls in past yr: 1 0 0 0 0  Injury with Fall? 1 0 1 1 0  Risk for fall due to : History of fall(s);Impaired  balance/gait;Impaired mobility History of fall(s);Impaired balance/gait;Impaired mobility History of fall(s);Impaired balance/gait;Impaired mobility History of fall(s);Impaired balance/gait;Impaired mobility History of fall(s);Impaired balance/gait;Impaired mobility  Follow up Falls evaluation completed Falls evaluation completed Falls evaluation completed  Falls evaluation completed    FALL RISK PREVENTION PERTAINING TO THE HOME:  Any stairs in or around the home? Yes  If so, are there any without handrails? No  Home free of loose throw rugs in walkways, pet beds, electrical cords, etc? Yes  Adequate lighting in your home to reduce risk of falls? Yes   ASSISTIVE DEVICES UTILIZED TO PREVENT FALLS:  Life alert? No  Use of a cane, walker or w/c? Yes  Grab bars in the bathroom? No  Shower chair or bench in shower? No  Elevated toilet seat or a handicapped toilet? No   TIMED UP AND GO:  Was the test performed? No .  Nonambulatory    Cognitive Function:    04/03/2022   11:29 AM  MMSE - Mini Mental State Exam  Orientation to  time   Orientation to Place   Registration   Attention/ Calculation   Recall   Language- name 2 objects   Language- repeat   Language- follow 3 step command   Language- read & follow direction   Write a sentence   Copy design   Total score      Information is confidential and restricted. Go to Review Flowsheets to unlock data.        Immunizations Immunization History  Administered Date(s) Administered   Influenza, High Dose Seasonal PF 08/19/2017   Influenza-Unspecified 08/14/2022   Moderna Covid-19 Vaccine Bivalent Booster 16yrs & up 09/28/2021   PFIZER(Purple Top)SARS-COV-2 Vaccination 12/06/2019, 12/27/2019, 08/17/2020   Pneumococcal Conjugate-13 08/19/2017   Pneumococcal Polysaccharide-23 09/22/2015   Tdap 01/05/2014   Zoster Recombinat (Shingrix) 07/23/2019, 09/24/2019    TDAP status: Up to date  Flu Vaccine status: Up to  date  Pneumococcal vaccine status: Up to date  Covid-19 vaccine status: Completed vaccines  Qualifies for Shingles Vaccine? Yes   Zostavax completed Yes   Shingrix Completed?: No.    Education has been provided regarding the importance of this vaccine. Patient has been advised to call insurance company to determine out of pocket expense if they have not yet received this vaccine. Advised may also receive vaccine at local pharmacy or Health Dept. Verbalized acceptance and understanding.  Screening Tests Health Maintenance  Topic Date Due   Medicare Annual Wellness (AWV)  02/06/2023   DEXA SCAN  10/13/2023 (Originally 10/09/2012)   COLONOSCOPY (Pts 45-32yrs Insurance coverage will need to be confirmed)  10/13/2023 (Originally 10/09/1992)   DTaP/Tdap/Td (2 - Td or Tdap) 01/06/2024   Pneumonia Vaccine 92+ Years old  Completed   INFLUENZA VACCINE  Completed   Hepatitis C Screening  Completed   Zoster Vaccines- Shingrix  Completed   HPV VACCINES  Aged Out   COVID-19 Vaccine  Discontinued    Health Maintenance  Health Maintenance Due  Topic Date Due   Medicare Annual Wellness (AWV)  02/06/2023    Colorectal cancer screening: Type of screening: Cologuard. Completed  . Repeat every   years  Mammogram status: Ordered  . Pt provided with contact info and advised to call to schedule appt.   Bone Density status: Ordered  . Pt provided with contact info and advised to call to schedule appt.  Lung Cancer Screening: (Low Dose CT Chest recommended if Age 57-80 years, 30 pack-year currently smoking OR have quit w/in 15years.) does not qualify.   Lung Cancer Screening Referral:   Additional Screening:  Hepatitis C Screening: does qualify; Completed 2024  Vision Screening: Recommended annual ophthalmology exams for early detection of glaucoma and other disorders of the eye. Is the patient up to date with their annual eye exam?  No  Who is the provider or what is the name of the office in  which the patient attends annual eye exams?  If pt is not established with a provider, would they like to be referred to a provider to establish care? No .   Dental Screening: Recommended annual dental exams for proper oral hygiene  Community Resource Referral / Chronic Care Management: CRR required this visit?  No   CCM required this visit?  No      Plan:     I have personally reviewed and noted the following in the patient's chart:   Medical and social history Use of alcohol, tobacco or illicit drugs  Current medications and supplements including opioid prescriptions. Patient is not currently  taking opioid prescriptions. Functional ability and status Nutritional status Physical activity Advanced directives List of other physicians Hospitalizations, surgeries, and ER visits in previous 12 months Vitals Screenings to include cognitive, depression, and falls Referrals and appointments  In addition, I have reviewed and discussed with patient certain preventive protocols, quality metrics, and best practice recommendations. A written personalized care plan for preventive services as well as general preventive health recommendations were provided to patient.     Gerlene Fee, NP   02/06/2023   Nurse Notes: this exam has been performed by myself at this facility

## 2023-02-06 NOTE — Patient Instructions (Signed)
  [name] , Thank you for taking time to come for your Medicare Wellness Visit. I appreciate your ongoing commitment to your health goals. Please review the following plan we discussed and let me know if I can assist you in the future.   These are the goals we discussed:  Goals      Absence of Fall and Fall-Related Injury     Evidence-based guidance:  Assess fall risk using a validated tool when available. Consider balance and gait impairment, muscle weakness, diminished vision or hearing, environmental hazards, presence of urinary or bowel urgency and/or incontinence.  Communicate fall injury risk to interprofessional healthcare team.  Develop a fall prevention plan with the patient and family.  Promote use of personal vision and auditory aids.  Promote reorientation, appropriate sensory stimulation, and routines to decrease risk of fall when changes in mental status are present.  Assess assistance level required for safe and effective self-care; consider referral for home care.  Encourage physical activity, such as performance of self-care at highest level of ability, strength and balance exercise program, and provision of appropriate assistive devices; refer to rehabilitation therapy.  Refer to community-based fall prevention program where available.  If fall occurs, determine the cause and revise fall injury prevention plan.  Regularly review medication contribution to fall risk; consider risk related to polypharmacy and age.  Refer to pharmacist for consultation when concerns about medications are revealed.  Balance adequate pain management with potential for oversedation.  Provide guidance related to environmental modifications.  Consider supplementation with Vitamin D.   Notes:      DIET - INCREASE WATER INTAKE     General - Client will not be readmitted within 30 days (C-SNP)        This is a list of the screening recommended for you and due dates:  Health Maintenance  Topic  Date Due   Medicare Annual Wellness Visit  02/06/2023   DEXA scan (bone density measurement)  10/13/2023*   Colon Cancer Screening  10/13/2023*   DTaP/Tdap/Td vaccine (2 - Td or Tdap) 01/06/2024   Pneumonia Vaccine  Completed   Flu Shot  Completed   Hepatitis C Screening: USPSTF Recommendation to screen - Ages 21-79 yo.  Completed   Zoster (Shingles) Vaccine  Completed   HPV Vaccine  Aged Out   COVID-19 Vaccine  Discontinued  *Topic was postponed. The date shown is not the original due date.

## 2023-02-06 NOTE — Progress Notes (Signed)
Location:  [place] Room Number: 101W Place of Service:  SNF (31)   CODE STATUS: Full Code  Allergies  Allergen Reactions   Ticlid [Ticlopidine] Shortness Of Breath   Nsaids Other (See Comments)    Bleeding ulcer   Prednisone Other (See Comments)    Makes her stomach hurt--knows this isn't an allergy but doesn't want to take it    Trazodone And Nefazodone     hjallucinations   Monistat [Miconazole] Swelling and Rash   Sulfa Antibiotics Rash    Chief Complaint  Patient presents with   Medicare Wellness    Medicare Annual Wellness Visit    HPI:    Past Medical History:  Diagnosis Date   Anemia    Arthritis    Asthma    Atrial fibrillation ([place])    COVID-19 08/05/2022   Depression    H/O head injury    Hearing loss    Heart murmur    can be heard at times, Doctor said not to worry   History of kidney stones    History of stomach ulcers    Migraine    Pneumonia    Psittacosis    Stroke ([place])    Weakness of neck 03/02/2013    Past Surgical History:  Procedure Laterality Date   ANKLE FRACTURE SURGERY Left    APPENDECTOMY     BREAST LUMPECTOMY     CESAREAN SECTION     x 3   I & D KNEE WITH POLY EXCHANGE Left 08/16/2022   Procedure: KNEE POLY EXCHANGE;  Surgeon: Hiram Gash, MD;  Location: [place];  Service: Orthopedics;  Laterality: Left;   KNEE ARTHROSCOPY WITH PATELLAR TENDON REPAIR Left 08/16/2022   Procedure: IRRIGATION AND DEBRIDEMENT LEFT KNEE REVISION WITH PATELLAR TENDON REPAIR and poly exchange;  Surgeon: Hiram Gash, MD;  Location: [place];  Service: Orthopedics;  Laterality: Left;   PARTIAL HYSTERECTOMY     ROTATOR CUFF REPAIR Right    TOTAL KNEE ARTHROPLASTY Left 07/22/2022   Procedure: TOTAL KNEE ARTHROPLASTY;  Surgeon: [name], MD;  Location: [facility];  Service: Orthopedics;  Laterality: Left;    Social History   Socioeconomic History   Marital status: Married    Spouse name: Not on file   Number of  children: 3   Years of education: Not on file   Highest education level: Not on file  Occupational History   Occupation: retired  Tobacco Use   Smoking status: Never   Smokeless tobacco: Never  Vaping Use   Vaping Use: Never used  Substance and Sexual Activity   Alcohol use: Not Currently    Comment: occ beer   Drug use: No   Sexual activity: Yes    Birth control/protection: Surgical    Comment: hyst  Other Topics Concern   Not on file  Social History Narrative   Not on file   Social Determinants of Health   Financial Resource Strain: Medium Risk (01/30/2021)   Overall Financial Resource Strain (CARDIA)    Difficulty of Paying Living Expenses: Somewhat hard  Food Insecurity: No Food Insecurity (08/15/2022)   Hunger Vital Sign    Worried About Running Out of Food in the Last Year: Never true    Ran Out of Food in the Last Year: Never true  Transportation Needs: No Transportation Needs (08/15/2022)   PRAPARE - Hydrologist (Medical): No    Lack of Transportation (Non-Medical): No  Physical Activity: Insufficiently Active (01/30/2021)   Exercise Vital Sign    Days of Exercise per Week: 2 days    Minutes of Exercise per Session: 10 min  Stress: No Stress Concern Present (01/30/2021)   [place]    Feeling of Stress : Only a little  Social Connections: Moderately Integrated (01/30/2021)   Social Connection and Isolation Panel [NHANES]    Frequency of Communication with Friends and Family: Twice a week    Frequency of Social Gatherings with Friends and Family: Twice a week    Attends Religious Services: More than 4 times per year    Active Member of [employer] or Organizations: No    Attends Archivist Meetings: Never    Marital Status: Married  Human resources officer Violence: Not At Risk (08/15/2022)   Humiliation, Afraid, Rape, and Kick questionnaire    Fear of Current or  Ex-Partner: No    Emotionally Abused: No    Physically Abused: No    Sexually Abused: No   Family History  Problem Relation Age of Onset   Cancer Father    Heart disease Brother    Alcohol abuse Brother    Depression Maternal Grandmother    Depression Grandchild    Breast cancer Daughter       VITAL SIGNS BP (!) 109/50   Pulse 63   Temp 97.9 F (36.6 C)   Resp 18   Ht 5\' 5"  (1.651 m)   Wt 176 lb 6 oz (80 kg)   SpO2 95%   BMI 29.35 kg/m   Outpatient Encounter Medications as of 02/06/2023  Medication Sig   acetaminophen (TYLENOL) 500 MG tablet Take 1,000 mg by mouth every 8 (eight) hours as needed for headache or mild pain.   apixaban (ELIQUIS) 5 MG TABS tablet Take 1 tablet (5 mg total) by mouth 2 (two) times daily.   ARIPiprazole (ABILIFY) 10 MG tablet Take 10 mg by mouth daily.   busPIRone (BUSPAR) 30 MG tablet Take 1 tablet (30 mg total) by mouth 2 (two) times daily.   Cholecalciferol (VITAMIN D) 125 MCG (5000 UT) CAPS Take 5,000 Units by mouth daily.   donepezil (ARICEPT) 10 MG tablet Take 10 mg by mouth at bedtime.   eszopiclone (LUNESTA) 1 MG TABS tablet Take 1 tablet (1 mg total) by mouth at bedtime. Take immediately before bedtime   furosemide (LASIX) 20 MG tablet Take 20 mg by mouth.   memantine (NAMENDA XR) 28 MG CP24 24 hr capsule Take 28 mg by mouth daily.   metoprolol tartrate (LOPRESSOR) 25 MG tablet Take 1 tablet (25 mg total) by mouth 2 (two) times daily.   modafinil (PROVIGIL) 200 MG tablet Take 100 mg by mouth daily.   MYRBETRIQ 50 MG TB24 tablet Take 50 mg by mouth daily.   NUTRITIONAL SUPPLEMENTS PO Take 120 mLs by mouth 2 (two) times daily between meals. Med Pass   Omeprazole 20 MG TBDD Take 20 mg by mouth daily as needed.   polyethylene glycol (MIRALAX / GLYCOLAX) 17 g packet Take 17 g by mouth daily as needed.   senna (SENOKOT) 8.6 MG TABS tablet Take 1 tablet (8.6 mg total) by mouth 2 (two) times daily.   SUMAtriptan (IMITREX) 100 MG tablet Take  100 mg by mouth every 2 (two) hours as needed for migraine or headache. May repeat in 2 hours if headache persists or recurs.   UNABLE TO FIND Diet: Regular   valbenazine (  INGREZZA) 80 MG capsule Take 1 capsule (80 mg total) by mouth daily.   vortioxetine HBr (TRINTELLIX) 10 MG TABS tablet Take 10 mg by mouth daily.   ZINC OXIDE EX Apply 1 Application topically See admin instructions. every shift and prn Special Instructions: Apply to sacrum, bilateral buttocks and coccyx q shift for redness.   No facility-administered encounter medications on file as of 02/06/2023.     SIGNIFICANT DIAGNOSTIC EXAMS       ASSESSMENT/ PLAN:     Ok Edwards NP [facility]  Contact [phone] Monday through Friday 8am- 5pm  After hours call [phone]

## 2023-02-10 DIAGNOSIS — M6281 Muscle weakness (generalized): Secondary | ICD-10-CM | POA: Diagnosis not present

## 2023-02-10 DIAGNOSIS — R279 Unspecified lack of coordination: Secondary | ICD-10-CM | POA: Diagnosis not present

## 2023-02-10 DIAGNOSIS — Z471 Aftercare following joint replacement surgery: Secondary | ICD-10-CM | POA: Diagnosis not present

## 2023-02-10 DIAGNOSIS — F411 Generalized anxiety disorder: Secondary | ICD-10-CM | POA: Diagnosis not present

## 2023-02-10 DIAGNOSIS — I639 Cerebral infarction, unspecified: Secondary | ICD-10-CM | POA: Diagnosis not present

## 2023-02-10 DIAGNOSIS — M159 Polyosteoarthritis, unspecified: Secondary | ICD-10-CM | POA: Diagnosis not present

## 2023-02-10 DIAGNOSIS — G2401 Drug induced subacute dyskinesia: Secondary | ICD-10-CM | POA: Diagnosis not present

## 2023-02-10 DIAGNOSIS — F332 Major depressive disorder, recurrent severe without psychotic features: Secondary | ICD-10-CM | POA: Diagnosis not present

## 2023-02-11 ENCOUNTER — Ambulatory Visit: Payer: PPO | Admitting: Psychiatry

## 2023-02-11 ENCOUNTER — Encounter: Payer: Self-pay | Admitting: Psychiatry

## 2023-02-11 VITALS — BP 130/70 | HR 72 | Wt 176.0 lb

## 2023-02-11 DIAGNOSIS — F5101 Primary insomnia: Secondary | ICD-10-CM

## 2023-02-11 DIAGNOSIS — G2401 Drug induced subacute dyskinesia: Secondary | ICD-10-CM | POA: Diagnosis not present

## 2023-02-11 DIAGNOSIS — F332 Major depressive disorder, recurrent severe without psychotic features: Secondary | ICD-10-CM | POA: Diagnosis not present

## 2023-02-11 DIAGNOSIS — F411 Generalized anxiety disorder: Secondary | ICD-10-CM | POA: Diagnosis not present

## 2023-02-11 NOTE — Progress Notes (Signed)
[name] [id number] 02-Jul-1946 77 y.o.  Subjective:   Patient ID:  [name] is a 77 y.o. (DOB 06-18-1946) female.  Chief Complaint:  Chief Complaint  Patient presents with   Depression   Anxiety   Sleeping Problem    HPI [name] presents to the office today for follow-up of depression, anxiety, insomnia, and tardive dyskinesia.  She was last seen on 06/18/22. Had a knee replacement. She fell after the knee replacement and fractured her patella. She has been at [facility] for 7 months. She reports, "I was doing better until about a month ago when they changed some medicine... I'm sleepy all the time, I talk like I am drugged, I am just not myself."   Daughter joinings visit via speaker phone at request of patient.  Daughter reports, "things were going really well for awhile." Daughter reports that [name] was added 01/10/23 and titrated and she feels that "the timing of that kind of hit with mom's low spell." Daughter reports that patient's antidepressant was also changed recently from Cymbalta to Trintellix. It appears that Trintellix was started 02/01/23. Cymbalta was decreased from 60 mg to 30 mg on 02/01/23 and stopped one week later on 02/08/23. Modafinil was decreased from 100 mg daily to every other day started 01/24/23 and was discontinued on 01/31/23.   Daughter also noticed similar responses to Namenda XR that patient reported prior to calling daughter.   Patient reports her mood is "awful, I don't want to do anything. I don't want to get out... all I want to do is sleep." She reports that her energy and motivation are low. She reports that she is having to sleep frequently. She reports that she is "not doing a good job in PT" due to low energy and motivation. She reports that she has sad mood. She reports anxiety with completing certain tasks, such as dressing herself.  She reports increased anxiety in the last month. She reports that she has been worrying and feeling  nervous. Denies panic attacks. Falls asleep without difficulty. Difficulty staying asleep. Describes multiple middle of the night awakenings. Having to nap more than usual. She reports poor concentration- "I was reading and I just can't read now." She reports some difficulty with memory. Appetite has been "ok." Diminished interest in things. She reports that she is no longer interacting socially. Denies SI.   Denies AH or VH.   She reports that over a month ago she was "in a good mood, anxious to go to PT, and motivated." She reports that she had a "better mind frame" and was interacting socially with other patients. Was reading and able to concentrate.   Past medication trials: Celexa Pristiq Wellbutrin-hallucinations Effexor Prozac Zoloft Lexapro Nefazodone-effective and well-tolerated Cymbalta Abilify Latuda-Adverse reaction. Worsening depression Lamotrigine-Worsening mood and anxiety Nuedexta Trazodone Gabapentin- Disrupted sleep schedules, nightmares.  Vistaril Nuvigil Provigil Memantine Lunesta- Effective for insomnia   AIMS    Flowsheet Row Office Visit from 02/11/2023 in [place] Office Visit from 06/18/2022 in [facility] Office Visit from 05/02/2022 in [place] Office Visit from 07/24/2021 in [place] Office Visit from 03/20/2021 in [place] Total Score 10 6 11  0 0      GAD-7    Flowsheet Row Office Visit from 01/30/2021 in [facility] for [place] [facility]  Total GAD-7 Score 1  Mini-Mental    Flowsheet Row Office Visit from 04/03/2022 in [place]  Total Score (max 30 points ) 30      PHQ2-9    [place] from 02/06/2023 in [place] from 12/12/2022 in [place] from 11/06/2022 in [facility] Office Visit from 01/30/2021 in [facility] for [place] at [place] Visit from 05/11/2019 in [facility] for [place] at [facility]  PHQ-2 Total Score 0 0 1 2 1   PHQ-9 Total Score 1 0 3 6 --      Flowsheet Row Pre-Admission Testing 60 from 10/11/2022 in [facility] (Discharged) from 08/14/2022 in [employer] [facility] Admission (Discharged) from 07/22/2022 in [facility] No Risk No Risk No Risk        Review of Systems:  Review of Systems  Cardiovascular:  Negative for palpitations.  Gastrointestinal:  Negative for nausea and vomiting.  Musculoskeletal:  Positive for gait problem.  Psychiatric/Behavioral:         Please refer to HPI    Medications: I have reviewed the patient's current medications.  Current Outpatient Medications  Medication Sig Dispense Refill   acetaminophen (TYLENOL) 500 MG tablet Take 1,000 mg by mouth every 8 (eight) hours as needed for headache or mild pain.     apixaban (ELIQUIS) 5 MG TABS tablet Take 1 tablet (5 mg total) by mouth 2 (two) times daily. 60 tablet    ARIPiprazole (ABILIFY) 10 MG tablet Take 10 mg by mouth daily.     busPIRone (BUSPAR) 30 MG tablet Take 1 tablet (30 mg total) by mouth 2 (two) times daily. 180 tablet 0   Calcium Carb-Cholecalciferol (CALCIUM CARBONATE+VITAMIN D PO) Take by mouth.     donepezil (ARICEPT) 10 MG tablet Take 10 mg by mouth at bedtime.     eszopiclone (LUNESTA) 1 MG TABS tablet Take 1 tablet (1 mg total) by mouth at bedtime. Take immediately before bedtime 30 tablet 0   furosemide (LASIX) 20 MG tablet Take 20 mg by mouth.     memantine (NAMENDA XR) 28 MG CP24 24 hr capsule Take 28 mg by mouth daily.     metoprolol tartrate (LOPRESSOR) 25 MG tablet Take 1 tablet (25 mg total) by  mouth 2 (two) times daily. 180 tablet 3   MYRBETRIQ 50 MG TB24 tablet Take 50 mg by mouth daily.     Omeprazole 20 MG TBDD Take 20 mg by mouth daily as needed. 30 tablet 3   polyethylene glycol (MIRALAX / GLYCOLAX) 17 g packet Take 17 g by mouth daily as needed. 14 each 0   SUMAtriptan (IMITREX) 100 MG tablet Take 100 mg by mouth every 2 (two) hours as needed for migraine or headache. May repeat in 2 hours if headache persists or recurs.     valbenazine (INGREZZA) 80 MG capsule Take 1 capsule (80 mg total) by mouth daily. 30 capsule 12   vortioxetine HBr (TRINTELLIX) 10 MG TABS tablet Take 10 mg by mouth daily.     Cholecalciferol (VITAMIN D) 125 MCG (5000 UT) CAPS Take 5,000 Units by mouth daily.     modafinil (PROVIGIL) 200 MG tablet Take 100 mg by mouth daily. (Patient not taking: Reported on 02/11/2023)     NUTRITIONAL SUPPLEMENTS  PO Take 120 mLs by mouth 2 (two) times daily between meals. Med Pass     senna (SENOKOT) 8.6 MG TABS tablet Take 1 tablet (8.6 mg total) by mouth 2 (two) times daily. (Patient not taking: Reported on 02/11/2023) 120 tablet 0   UNABLE TO FIND Diet: Regular     ZINC OXIDE EX Apply 1 Application topically See admin instructions. every shift and prn Special Instructions: Apply to sacrum, bilateral buttocks and coccyx q shift for redness.     No current facility-administered medications for this visit.    Medication Side Effects: Fatigue, Sleep Problems, and Other: Worsening mood and anxiety.  Allergies:  Allergies  Allergen Reactions   Ticlid [Ticlopidine] Shortness Of Breath   Nsaids Other (See Comments)    Bleeding ulcer   Prednisone Other (See Comments)    Makes her stomach hurt--knows this isn't an allergy but doesn't want to take it    Trazodone And Nefazodone     hjallucinations   Monistat [Miconazole] Swelling and Rash   Sulfa Antibiotics Rash    Past Medical History:  Diagnosis Date   Anemia    Arthritis    Asthma    Atrial fibrillation     COVID-19 08/05/2022   Depression    H/O head injury    Hearing loss    Heart murmur    can be heard at times, Doctor said not to worry   History of kidney stones    History of stomach ulcers    Migraine    Pneumonia    Psittacosis    Stroke    Weakness of neck 03/02/2013    Past Medical History, Surgical history, Social history, and Family history were reviewed and updated as appropriate.   Please see review of systems for further details on the patient's review from today.   Objective:   Physical Exam:  BP 130/70   Pulse 72   Wt 176 lb (79.8 kg)   BMI 29.29 kg/m   Physical Exam Constitutional:      General: She is not in acute distress. Musculoskeletal:        General: No deformity.  Neurological:     Mental Status: She is alert and oriented to person, place, and time.     Coordination: Coordination normal.  Psychiatric:        Attention and Perception: Attention and perception normal. She does not perceive auditory or visual hallucinations.        Mood and Affect: Mood is anxious and depressed. Affect is blunt. Affect is not labile, angry or inappropriate.        Speech: Speech normal.        Behavior: Behavior normal.        Thought Content: Thought content normal. Thought content is not paranoid or delusional. Thought content does not include homicidal or suicidal ideation. Thought content does not include homicidal or suicidal plan.        Cognition and Memory: Cognition is impaired.        Judgment: Judgment normal.     Comments: Insight intact Able to recall recent events pertaining to her treatment     Lab Review:     Component Value Date/Time   NA 138 10/17/2022 0800   NA 140 05/27/2017 1553   K 4.1 10/17/2022 0800   CL 99 10/17/2022 0800   CO2 30 10/17/2022 0800   GLUCOSE 98 10/17/2022 0800   BUN 17 10/17/2022 0800   BUN 15 05/27/2017 1553  CREATININE 0.56 10/17/2022 0800   CALCIUM 9.4 10/17/2022 0800   PROT 7.0 07/18/2022 1459   PROT 7.4  05/27/2017 1553   ALBUMIN 4.1 07/18/2022 1459   ALBUMIN 4.5 05/27/2017 1553   AST 24 07/18/2022 1459   ALT 15 07/18/2022 1459   ALKPHOS 50 07/18/2022 1459   BILITOT 0.5 07/18/2022 1459   BILITOT 0.4 05/27/2017 1553   GFRNONAA >60 10/17/2022 0800   GFRAA 104 05/27/2017 1553       Component Value Date/Time   WBC 9.1 08/23/2022 0452   RBC 3.27 (L) 08/23/2022 0452   HGB 10.1 (L) 08/23/2022 0452   HCT 30.8 (L) 08/23/2022 0452   PLT 379 08/23/2022 0452   MCV 94.2 08/23/2022 0452   MCH 30.9 08/23/2022 0452   MCHC 32.8 08/23/2022 0452   RDW 14.4 08/23/2022 0452   LYMPHSABS 1.6 08/23/2022 0452   MONOABS 1.0 08/23/2022 0452   EOSABS 0.7 (H) 08/23/2022 0452   BASOSABS 0.1 08/23/2022 0452    No results found for: "POCLITH", "LITHIUM"   No results found for: "PHENYTOIN", "PHENOBARB", "VALPROATE", "CBMZ"   .res Assessment: Plan:    Pt seen for 50 minutes and time spent discussing changes in medical history and medications with patient and her daughter, reviewing the [facility], and communicating recommendations to facility providers via handwritten Consultation Request and Report form. Recommend the following: Decrease Namenda XR to 21 mg daily for 3 days, then decrease to 14 mg daily for 3 days, then decrease to 7 mg daily for 3 days, then stop due to fatigue, drowsiness, and worsening mood. Restart duloxetine 30 mg daily for 1 week, then increase to 60 mg daily for depression and anxiety due to worsening anxiety and mood symptoms following discontinuation. Restart modafinil 100 mg daily due to hypersomnolence, fatigue, low energy, and low motivation following discontinuation of modafinil. Discontinue Trintellix since her mood and anxiety symptoms were better controlled with duloxetine. Continue Lunesta 1 mg at bedtime for insomnia due to history of worsening insomnia with discontinuation in the past. Continue Ingrezza 80 mg daily for tardive dyskinesia since both patient and her daughter  report that tardive dyskinesia significantly worsened when patient was without Ingrezza in the past.  Recommend changing administration time of Ingrezza to bedtime if currently taking in the morning, since Ingrezza can cause drowsiness. Continue Abilify 10 mg daily for depression due to at least 2 failed gradual dose reduction attempts in the past with worsening depression. Continue BuSpar 30 mg twice daily for anxiety. Continue Aricept 10 mg daily for cognition.   Patient to follow-up with this provider in 6 to 8 weeks or sooner if clinically indicated. Patient advised to contact office with any questions, adverse effects, or acute worsening in signs and symptoms.    [name] was seen today for depression, anxiety and sleeping problem.  Diagnoses and all orders for this visit:  Severe episode of recurrent major depressive disorder, without psychotic features  Generalized anxiety disorder  Tardive dyskinesia  Primary insomnia     Please see After Visit Summary for patient specific instructions.  Future Appointments  Date Time Provider [place]  02/27/2023 11:30 AM Estill Dooms, NP [facility]  03/13/2023 12:45 PM Narda Amber K, DO [facility] None  03/27/2023  2:30 PM Thayer Headings, PMHNP CP-CP None  04/04/2023  1:15 PM AP-MM 1 AP-MM [place] [facility]    No orders of the defined types were placed in this encounter.   -------------------------------

## 2023-02-12 ENCOUNTER — Non-Acute Institutional Stay (SKILLED_NURSING_FACILITY): Payer: PPO | Admitting: Adult Health

## 2023-02-12 DIAGNOSIS — R634 Abnormal weight loss: Secondary | ICD-10-CM | POA: Diagnosis not present

## 2023-02-12 DIAGNOSIS — F0150 Vascular dementia without behavioral disturbance: Secondary | ICD-10-CM | POA: Diagnosis not present

## 2023-02-12 DIAGNOSIS — F323 Major depressive disorder, single episode, severe with psychotic features: Secondary | ICD-10-CM

## 2023-02-13 ENCOUNTER — Encounter: Payer: Self-pay | Admitting: Adult Health

## 2023-02-13 ENCOUNTER — Non-Acute Institutional Stay (SKILLED_NURSING_FACILITY): Payer: PPO | Admitting: Internal Medicine

## 2023-02-13 ENCOUNTER — Encounter: Payer: Self-pay | Admitting: Internal Medicine

## 2023-02-13 DIAGNOSIS — G2401 Drug induced subacute dyskinesia: Secondary | ICD-10-CM

## 2023-02-13 DIAGNOSIS — I4811 Longstanding persistent atrial fibrillation: Secondary | ICD-10-CM

## 2023-02-13 DIAGNOSIS — Z8673 Personal history of transient ischemic attack (TIA), and cerebral infarction without residual deficits: Secondary | ICD-10-CM

## 2023-02-13 DIAGNOSIS — D649 Anemia, unspecified: Secondary | ICD-10-CM | POA: Diagnosis not present

## 2023-02-13 NOTE — Progress Notes (Unsigned)
NURSING HOME LOCATION:  [facility] ROOM NUMBER:  101 W  CODE STATUS:  Full Code  PCP:  Synthia Innocenteborah Green NP  This is a nursing facility follow up visit of chronic medical diagnoses & to document compliance with Regulation 483.30 (c) in The Long Term Care Survey Manual Phase 2 which mandates caregiver visit ( visits can alternate among physician, PA or NP as per statutes) within 10 days of 30 days / 60 days/ 90 days post admission to SNF date    Interim medical record and care since last SNF visit was updated with review of diagnostic studies and change in clinical status since last visit were documented.  HPI: She is a permanent resident of the facility with medical diagnoses chronic anemia, history of asthma, atrial fibrillation, chronic major depression, history of nephrolithiasis, history of gastric ulcers, history of silicosis, and history of stroke. EKG was performed 01/07/2023 and revealed chronic atrial fibrillation.  Most recent chemistries revealed no significant abnormalities. Thge Psychiatric evaluation 02/11/23 was reviewed for continuity of care. Review of systems: She states that she is "not well."  When asked to explain what she meant she went on to state that she was "frustrated I cannot get my shoes on."  Apparently she has difficulty bending over and pulling on the shoes especially with foot drop on the right.  This also has not impeded ambulation despite working with PT.  She states that she is to have a "electrical inclusion (study)" on 5/2 to assess the etiology of the dropfoot.  This has persisted she states for 8 weeks despite having an elongated shoehorn.  The right foot drop does preclude ambulation.  She validates having seen the psychiatric specialist this past week for depression.  She is anxious to "get back on medications on."   Constitutional: No fever, significant weight change, fatigue  Eyes: No redness, discharge, pain, vision change ENT/mouth: No  nasal congestion,  purulent discharge, earache, change in hearing, sore throat  Cardiovascular: No chest pain, palpitations, paroxysmal nocturnal dyspnea, claudication, edema  Respiratory: No cough, sputum production, hemoptysis, DOE, significant snoring, apnea   Gastrointestinal: No heartburn, dysphagia, abdominal pain, nausea /vomiting, rectal bleeding, melena, change in bowels Genitourinary: No dysuria, hematuria, pyuria, incontinence, nocturia Musculoskeletal: No joint stiffness, joint swelling, weakness, pain Dermatologic: No rash, pruritus, change in appearance of skin Neurologic: No dizziness, headache, syncope, seizures, numbness, tingling Psychiatric: No significant anxiety, depression, insomnia, anorexia Endocrine: No change in hair/skin/nails, excessive thirst, excessive hunger, excessive urination  Hematologic/lymphatic: No significant bruising, lymphadenopathy, abnormal bleeding Allergy/immunology: No itchy/watery eyes, significant sneezing, urticaria, angioedema  Physical exam:  Pertinent or positive findings: Facies are blank and affect is markedly flat.  She exhibits a tremor of the mandible.  Eyebrows are decreased laterally.  Heart rate is slow and irregular.  Pedal pulses are decreased.  She has one half-1+ edema at the sock line.  Surgical dressing is present over both heels.  She has severe interosseous wasting.  There is marked DIP and PIP arthritic changes with lateral deviation of the fingers.  Right upper extremity and right lower extremity are stronger than the left upper and left lower extremities. General appearance: Adequately nourished; no acute distress, increased work of breathing is present.   Lymphatic: No lymphadenopathy about the head, neck, axilla. Eyes: No conjunctival inflammation or lid edema is present. There is no scleral icterus. Ears:  External ear exam shows no significant lesions or deformities.   Nose:  External nasal examination shows no  deformity or  inflammation. Nasal mucosa are pink and moist without lesions, exudates Oral exam:  Lips and gums are healthy appearing. There is no oropharyngeal erythema or exudate. Neck:  No thyromegaly, masses, tenderness noted.    Heart:  Normal rate and regular rhythm. S1 and S2 normal without gallop, murmur, click, rub .  Lungs: Chest clear to auscultation without wheezes, rhonchi, rales, rubs. Abdomen: Bowel sounds are normal. Abdomen is soft and nontender with no organomegaly, hernias, masses. GU: Deferred  Extremities:  No cyanosis, clubbing, edema  Neurologic exam : Cn 2-7 intact Strength equal  in upper & lower extremities Balance, Rhomberg, finger to nose testing could not be completed due to clinical state Deep tendon reflexes are equal Skin: Warm & dry w/o tenting. No significant lesions or rash.  See summary under each active problem in the Problem List with associated updated therapeutic plan

## 2023-02-13 NOTE — Patient Instructions (Signed)
See assessment and plan under each diagnosis in the problem list and acutely for this visit 

## 2023-02-13 NOTE — Progress Notes (Signed)
Location:  [facility]   Place of Service:      CODE STATUS: FULL CODE  Allergies  Allergen Reactions   Ticlid [Ticlopidine] Shortness Of Breath   Nsaids Other (See Comments)    Bleeding ulcer   Prednisone Other (See Comments)    Makes her stomach hurt--knows this isn't an allergy but doesn't want to take it    Trazodone And Nefazodone     hjallucinations   Monistat [Miconazole] Swelling and Rash   Sulfa Antibiotics Rash    Chief Complaint  Patient presents with   Acute Visit    Weight issues    HPI:  She is losing weight. 6 months ago weighed 190 pounds with her weight in April of 176 pounds. She is being weaned off namenda; has been seen by her psychiatrist with medication changes. She does have a good appetite. She is on ensure daily.    Past Medical History:  Diagnosis Date   Anemia    Arthritis    Asthma    Atrial fibrillation    COVID-19 08/05/2022   Depression    H/O head injury    Hearing loss    Heart murmur    can be heard at times, Doctor said not to worry   History of kidney stones    History of stomach ulcers    Migraine    Pneumonia    Psittacosis    Stroke    Weakness of neck 03/02/2013    Past Surgical History:  Procedure Laterality Date   ANKLE FRACTURE SURGERY Left    APPENDECTOMY     BREAST LUMPECTOMY     CESAREAN SECTION     x 3   I & D KNEE WITH POLY EXCHANGE Left 08/16/2022   Procedure: KNEE POLY EXCHANGE;  Surgeon: Bjorn Pippin, MD;  Location: [facility] OR;  Service: Orthopedics;  Laterality: Left;   KNEE ARTHROSCOPY WITH PATELLAR TENDON REPAIR Left 08/16/2022   Procedure: IRRIGATION AND DEBRIDEMENT LEFT KNEE REVISION WITH PATELLAR TENDON REPAIR and poly exchange;  Surgeon: Bjorn Pippin, MD;  Location: [facility] OR;  Service: Orthopedics;  Laterality: Left;   PARTIAL HYSTERECTOMY     ROTATOR CUFF REPAIR Right    TOTAL KNEE ARTHROPLASTY Left 07/22/2022   Procedure: TOTAL KNEE ARTHROPLASTY;  Surgeon: Joen Laura, MD;   Location: [facility];  Service: Orthopedics;  Laterality: Left;    Social History   Socioeconomic History   Marital status: Married    Spouse name: Not on file   Number of children: 3   Years of education: Not on file   Highest education level: Not on file  Occupational History   Occupation: retired  Tobacco Use   Smoking status: Never   Smokeless tobacco: Never  Vaping Use   Vaping Use: Never used  Substance and Sexual Activity   Alcohol use: Not Currently    Comment: occ beer   Drug use: No   Sexual activity: Yes    Birth control/protection: Surgical    Comment: hyst  Other Topics Concern   Not on file  Social History Narrative   Not on file   Social Determinants of Health   Financial Resource Strain: Medium Risk (01/30/2021)   Overall Financial Resource Strain (CARDIA)    Difficulty of Paying Living Expenses: Somewhat hard  Food Insecurity: No Food Insecurity (08/15/2022)   Hunger Vital Sign    Worried About Running Out of Food in the Last Year: Never true    Ran  Out of Food in the Last Year: Never true  Transportation Needs: No Transportation Needs (08/15/2022)   PRAPARE - Administrator, Civil Service (Medical): No    Lack of Transportation (Non-Medical): No  Physical Activity: Insufficiently Active (01/30/2021)   Exercise Vital Sign    Days of Exercise per Week: 2 days    Minutes of Exercise per Session: 10 min  Stress: No Stress Concern Present (01/30/2021)   [employer] of Occupational Health - Occupational Stress Questionnaire    Feeling of Stress : Only a little  Social Connections: Moderately Integrated (01/30/2021)   Social Connection and Isolation Panel [NHANES]    Frequency of Communication with Friends and Family: Twice a week    Frequency of Social Gatherings with Friends and Family: Twice a week    Attends Religious Services: More than 4 times per year    Active Member of [employer] or Organizations: No    Attends Banker  Meetings: Never    Marital Status: Married  Catering manager Violence: Not At Risk (08/15/2022)   Humiliation, Afraid, Rape, and Kick questionnaire    Fear of Current or Ex-Partner: No    Emotionally Abused: No    Physically Abused: No    Sexually Abused: No   Family History  Problem Relation Age of Onset   Cancer Father    Heart disease Brother    Alcohol abuse Brother    Depression Maternal Grandmother    Depression Grandchild    Breast cancer Daughter       VITAL SIGNS BP (!) 110/50   Pulse 80   Temp 98.5 F (36.9 C)   Resp 20   Ht 5\' 5"  (1.651 m)   Wt 176 lb (79.8 kg)   SpO2 92%   BMI 29.29 kg/m   Outpatient Encounter Medications as of 02/12/2023  Medication Sig   acetaminophen (TYLENOL) 650 MG CR tablet Take 650 mg by mouth at bedtime.   apixaban (ELIQUIS) 5 MG TABS tablet Take 1 tablet (5 mg total) by mouth 2 (two) times daily.   ARIPiprazole (ABILIFY) 10 MG tablet Take 10 mg by mouth daily.   busPIRone (BUSPAR) 30 MG tablet Take 1 tablet (30 mg total) by mouth 2 (two) times daily.   Calcium Carb-Cholecalciferol (CALCIUM CARBONATE+VITAMIN D PO) Take by mouth.   donepezil (ARICEPT) 10 MG tablet Take 10 mg by mouth at bedtime.   DULoxetine (CYMBALTA) 60 MG capsule Take 60 mg by mouth daily.   eszopiclone (LUNESTA) 1 MG TABS tablet Take 1 tablet (1 mg total) by mouth at bedtime. Take immediately before bedtime   furosemide (LASIX) 20 MG tablet Take 20 mg by mouth.   loratadine (CLARITIN) 10 MG tablet Take 10 mg by mouth daily.   memantine (NAMENDA XR) 28 MG CP24 24 hr capsule Take 21 mg by mouth daily.   metoprolol tartrate (LOPRESSOR) 25 MG tablet Take 1 tablet (25 mg total) by mouth 2 (two) times daily.   modafinil (PROVIGIL) 200 MG tablet Take 100 mg by mouth daily.   MYRBETRIQ 50 MG TB24 tablet Take 50 mg by mouth daily.   NUTRITIONAL SUPPLEMENTS PO Take 120 mLs by mouth 2 (two) times daily between meals. Med Pass   Omeprazole 20 MG TBDD Take 20 mg by mouth daily  as needed.   polyethylene glycol (MIRALAX / GLYCOLAX) 17 g packet Take 17 g by mouth daily as needed.   senna (SENOKOT) 8.6 MG TABS tablet Take 1 tablet (8.6 mg  total) by mouth 2 (two) times daily.   SUMAtriptan (IMITREX) 100 MG tablet Take 100 mg by mouth every 2 (two) hours as needed for migraine or headache. May repeat in 2 hours if headache persists or recurs.   UNABLE TO FIND Diet: Regular   valbenazine (INGREZZA) 80 MG capsule Take 1 capsule (80 mg total) by mouth daily.   ZINC OXIDE EX Apply 1 Application topically See admin instructions. every shift and prn Special Instructions: Apply to sacrum, bilateral buttocks and coccyx q shift for redness.   [DISCONTINUED] Cholecalciferol (VITAMIN D) 125 MCG (5000 UT) CAPS Take 5,000 Units by mouth daily.   acetaminophen (TYLENOL) 500 MG tablet Take 1,000 mg by mouth every 8 (eight) hours as needed for headache or mild pain.   [DISCONTINUED] vortioxetine HBr (TRINTELLIX) 10 MG TABS tablet Take 10 mg by mouth daily.   No facility-administered encounter medications on file as of 02/12/2023.     SIGNIFICANT DIAGNOSTIC EXAMS  PREVIOUS   08-16-22: chest x-ray: 1. No acute intrathoracic process.   08-21-22: ct of head:  1. Possible sulcal effacement and loss of gray-white differentiation in the posterior right frontal lobe may represent subacute infarct versus artifact. Consider further evaluation with MRI. 2. Mild chronic small vessel ischemia.  08-22-22: MRI head:  1. No acute intracranial abnormality. 2. Findings of chronic small vessel ischemia and volume loss.  NO NEW EXAMS   LABS REVIEWED PREVIOUS   08-04-22: wbc 6.9; hgb 10.1; hct 31.8; mcv 97.5 plt 371; glucose 93; bun 15; creat 0.66; k+ 4.0; na++ 135; ca 8.8; gfr >60 mag 1.8; tsh 1.460; free t3: 1.8; free t4: 1.02 08-14-22: wbc 9.3; hgb 10.5; hct 31.8 mcv 94.9 plt 324; glucose 93; bun 15 creat 0.66; k+ 3.6; na++ 138; ca 9.1; gfr >60 08-23-22: wbc 9.1; hgb 10.2; hct 31.8; mcv 94.9 plt  379'; glucose 100; bun 12; creat 0.61; k+ 4.3; na++ 139; ca 9.0; gfr >60 10-11-22: glucose 87; bun 16; creat 0.59; k+ 3.7; na++ 137; ca 9.4; gfr >60 10-17-22: glucose 98; bun 17; creat 0.56; k+ 4.1; na++ 138; ca 9.4; gfr >60 11-14-22: hepatitis C nr  NO NEW LABS    Review of Systems  Constitutional:  Negative for malaise/fatigue.  Respiratory:  Negative for cough and shortness of breath.   Cardiovascular:  Negative for chest pain, palpitations and leg swelling.  Gastrointestinal:  Negative for abdominal pain, constipation and heartburn.  Musculoskeletal:  Negative for back pain, joint pain and myalgias.  Skin: Negative.   Neurological:  Negative for dizziness.  Psychiatric/Behavioral:  The patient is not nervous/anxious.    Physical Exam Constitutional:      General: She is not in acute distress.    Appearance: She is well-developed. She is not diaphoretic.  Neck:     Thyroid: No thyromegaly.  Cardiovascular:     Rate and Rhythm: Normal rate and regular rhythm.     Pulses: Normal pulses.     Heart sounds: Normal heart sounds.  Pulmonary:     Effort: Pulmonary effort is normal. No respiratory distress.     Breath sounds: Normal breath sounds.  Abdominal:     General: Bowel sounds are normal. There is no distension.     Palpations: Abdomen is soft.     Tenderness: There is no abdominal tenderness.  Musculoskeletal:     Cervical back: Neck supple.     Right lower leg: No edema.     Left lower leg: No edema.  Comments: Is able to move all extremities   Lymphadenopathy:     Cervical: No cervical adenopathy.  Skin:    General: Skin is warm and dry.  Neurological:     Mental Status: She is alert. Mental status is at baseline.  Psychiatric:        Mood and Affect: Mood normal.       ASSESSMENT/ PLAN:  TODAY  Weight loss unintentional Vascular dementia without behavioral disturbance Major depression with psychotic features  Will stop calcium Will continue her  supplements as directed  Her mood medications have been changed.    Synthia Innocent NP [facility]   call [phone]

## 2023-02-14 ENCOUNTER — Encounter: Payer: Self-pay | Admitting: *Deleted

## 2023-02-14 NOTE — Assessment & Plan Note (Addendum)
CBC has not been checked since October 2023.  The normochromic, normocytic anemia has been essentially stable with minimal variation.  No bleeding dyscrasias reported by staff; updated CBC indicated.

## 2023-02-14 NOTE — Assessment & Plan Note (Signed)
There is clinical asymmetry in strength with the right upper and right lower extremity stronger than the left.  She also has a foot drop on the right.  Apparently this is to be evaluated with EMG/NCT on May 2 if her history is valid.

## 2023-02-14 NOTE — Assessment & Plan Note (Addendum)
On exam the clinical appearance is almost parkinsonian.  Facies are blank and affect markedly flat.  She exhibits persistent tremor of the mandible.  These findings will be reassessed by the SNF interdisciplinary team as her psychotropic medications are changed and doses adjusted as per Psych NP recommendations 02/11/23.

## 2023-02-14 NOTE — Assessment & Plan Note (Signed)
Clinically she is in atrial fibs but rate is slow and controlled.  No change indicated.

## 2023-02-14 NOTE — Progress Notes (Signed)
[facility] Quality Team Note  Name: LAKAISHA EBANKS Date of Birth: [birth year] MRN: [id number] Date: 02/14/2023  [facility] Quality Team has reviewed this patient's chart, please see recommendations below:  [facility] Quality Other; Pt had recent fracture.  Has [facility] deadline of 05/24/23.  Pt is scheduled for mammo in May.  Could your provider add on bone density to help close gap?

## 2023-02-17 ENCOUNTER — Other Ambulatory Visit: Payer: Self-pay | Admitting: Adult Health

## 2023-02-17 MED ORDER — ESZOPICLONE 1 MG PO TABS: 1.0000 mg | ORAL_TABLET | Freq: Every day | ORAL | 0 refills | Status: DC

## 2023-02-18 DIAGNOSIS — R634 Abnormal weight loss: Secondary | ICD-10-CM | POA: Insufficient documentation

## 2023-02-20 ENCOUNTER — Other Ambulatory Visit: Payer: Self-pay | Admitting: Adult Health

## 2023-02-20 MED ORDER — ESZOPICLONE 1 MG PO TABS: 1.0000 mg | ORAL_TABLET | Freq: Every day | ORAL | 0 refills | Status: DC

## 2023-02-21 ENCOUNTER — Non-Acute Institutional Stay (SKILLED_NURSING_FACILITY): Payer: PPO | Admitting: Adult Health

## 2023-02-21 DIAGNOSIS — R29818 Other symptoms and signs involving the nervous system: Secondary | ICD-10-CM

## 2023-02-21 DIAGNOSIS — R4189 Other symptoms and signs involving cognitive functions and awareness: Secondary | ICD-10-CM | POA: Diagnosis not present

## 2023-02-21 DIAGNOSIS — F0150 Vascular dementia without behavioral disturbance: Secondary | ICD-10-CM

## 2023-02-21 DIAGNOSIS — G2401 Drug induced subacute dyskinesia: Secondary | ICD-10-CM

## 2023-02-24 ENCOUNTER — Ambulatory Visit: Payer: PPO | Admitting: Psychiatry

## 2023-02-25 ENCOUNTER — Encounter: Payer: Self-pay | Admitting: Adult Health

## 2023-02-25 NOTE — Progress Notes (Signed)
Location:  [facility] Room Number: 101 Place of Service:  SNF (31)   CODE STATUS: full   Allergies  Allergen Reactions   Ticlid [Ticlopidine] Shortness Of Breath   Nsaids Other (See Comments)    Bleeding ulcer   Prednisone Other (See Comments)    Makes her stomach hurt--knows this isn't an allergy but doesn't want to take it    Trazodone And Nefazodone     hjallucinations   Monistat [Miconazole] Swelling and Rash   Sulfa Antibiotics Rash    Chief Complaint  Patient presents with   Acute Visit    Care plan meeting     HPI:  We have come together for her care plan meeting. BIMS 15/1 mood 15/30: depression; not sleeping well; not eating well; nervous; difficulty concentrating. She is nonambulatory with no falls. She is dependent for her adl care. She is frequently incontinent of bladder and incontinent of bowel. Dietary: feeds self; appetite; 75-100% regular diet weight is 176 pounds which is down 14 pounds in the past 6 months. Therapy: stopped today: PT: mod to max with transfers; sliding board min assist. Activities: declines most. She will continue to be followed for her chronic illnesses including: Neurocognitive deficits  Tardive dyskinesia  Vascular dementia without behavioral disturbance   Past Medical History:  Diagnosis Date   Anemia    Arthritis    Asthma    Atrial fibrillation    COVID-19 08/05/2022   Depression    H/O head injury    Hearing loss    Heart murmur    can be heard at times, Doctor said not to worry   History of kidney stones    History of stomach ulcers    Migraine    Pneumonia    Psittacosis    Stroke    Weakness of neck 03/02/2013    Past Surgical History:  Procedure Laterality Date   ANKLE FRACTURE SURGERY Left    APPENDECTOMY     BREAST LUMPECTOMY     CESAREAN SECTION     x 3   I & D KNEE WITH POLY EXCHANGE Left 08/16/2022   Procedure: KNEE POLY EXCHANGE;  Surgeon: Bjorn Pippin, MD;  Location: [facility] OR;   Service: Orthopedics;  Laterality: Left;   KNEE ARTHROSCOPY WITH PATELLAR TENDON REPAIR Left 08/16/2022   Procedure: IRRIGATION AND DEBRIDEMENT LEFT KNEE REVISION WITH PATELLAR TENDON REPAIR and poly exchange;  Surgeon: Bjorn Pippin, MD;  Location: [facility] OR;  Service: Orthopedics;  Laterality: Left;   PARTIAL HYSTERECTOMY     ROTATOR CUFF REPAIR Right    TOTAL KNEE ARTHROPLASTY Left 07/22/2022   Procedure: TOTAL KNEE ARTHROPLASTY;  Surgeon: Joen Laura, MD;  Location: [facility];  Service: Orthopedics;  Laterality: Left;    Social History   Socioeconomic History   Marital status: Married    Spouse name: Not on file   Number of children: 3   Years of education: Not on file   Highest education level: Not on file  Occupational History   Occupation: retired  Tobacco Use   Smoking status: Never   Smokeless tobacco: Never  Vaping Use   Vaping Use: Never used  Substance and Sexual Activity   Alcohol use: Not Currently    Comment: occ beer   Drug use: No   Sexual activity: Yes    Birth control/protection: Surgical    Comment: hyst  Other Topics Concern   Not on file  Social History Narrative  Not on file   Social Determinants of Health   Financial Resource Strain: Medium Risk (01/30/2021)   Overall Financial Resource Strain (CARDIA)    Difficulty of Paying Living Expenses: Somewhat hard  Food Insecurity: No Food Insecurity (08/15/2022)   Hunger Vital Sign    Worried About Running Out of Food in the Last Year: Never true    Ran Out of Food in the Last Year: Never true  Transportation Needs: No Transportation Needs (08/15/2022)   PRAPARE - Administrator, Civil Service (Medical): No    Lack of Transportation (Non-Medical): No  Physical Activity: Insufficiently Active (01/30/2021)   Exercise Vital Sign    Days of Exercise per Week: 2 days    Minutes of Exercise per Session: 10 min  Stress: No Stress Concern Present (01/30/2021)   [employer] of Occupational  Health - Occupational Stress Questionnaire    Feeling of Stress : Only a little  Social Connections: Moderately Integrated (01/30/2021)   Social Connection and Isolation Panel [NHANES]    Frequency of Communication with Friends and Family: Twice a week    Frequency of Social Gatherings with Friends and Family: Twice a week    Attends Religious Services: More than 4 times per year    Active Member of [employer] or Organizations: No    Attends Banker Meetings: Never    Marital Status: Married  Catering manager Violence: Not At Risk (08/15/2022)   Humiliation, Afraid, Rape, and Kick questionnaire    Fear of Current or Ex-Partner: No    Emotionally Abused: No    Physically Abused: No    Sexually Abused: No   Family History  Problem Relation Age of Onset   Cancer Father    Heart disease Brother    Alcohol abuse Brother    Depression Maternal Grandmother    Depression Grandchild    Breast cancer Daughter       VITAL SIGNS BP 138/74   Pulse 70   Temp 98 F (36.7 C)   Resp 20   Ht 5\' 5"  (1.651 m)   Wt 176 lb (79.8 kg)   SpO2 98%   BMI 29.29 kg/m   Outpatient Encounter Medications as of 02/21/2023  Medication Sig   acetaminophen (TYLENOL) 500 MG tablet Take 1,000 mg by mouth every 8 (eight) hours as needed for headache or mild pain.   acetaminophen (TYLENOL) 650 MG CR tablet Take 650 mg by mouth at bedtime.   apixaban (ELIQUIS) 5 MG TABS tablet Take 1 tablet (5 mg total) by mouth 2 (two) times daily.   ARIPiprazole (ABILIFY) 10 MG tablet Take 10 mg by mouth daily.   busPIRone (BUSPAR) 30 MG tablet Take 1 tablet (30 mg total) by mouth 2 (two) times daily.   Calcium Carb-Cholecalciferol (CALCIUM CARBONATE+VITAMIN D PO) Take by mouth.   donepezil (ARICEPT) 10 MG tablet Take 10 mg by mouth at bedtime.   DULoxetine (CYMBALTA) 60 MG capsule Take 60 mg by mouth daily.   eszopiclone (LUNESTA) 1 MG TABS tablet Take 1 tablet (1 mg total) by mouth at bedtime. Take immediately  before bedtime   furosemide (LASIX) 20 MG tablet Take 20 mg by mouth.   loratadine (CLARITIN) 10 MG tablet Take 10 mg by mouth daily.   metoprolol tartrate (LOPRESSOR) 25 MG tablet Take 1 tablet (25 mg total) by mouth 2 (two) times daily.   modafinil (PROVIGIL) 200 MG tablet Take 100 mg by mouth daily.   MYRBETRIQ 50 MG TB24  tablet Take 50 mg by mouth daily.   NUTRITIONAL SUPPLEMENTS PO Take 120 mLs by mouth 2 (two) times daily between meals. Med Pass   Omeprazole 20 MG TBDD Take 20 mg by mouth daily as needed.   polyethylene glycol (MIRALAX / GLYCOLAX) 17 g packet Take 17 g by mouth daily as needed.   senna (SENOKOT) 8.6 MG TABS tablet Take 1 tablet (8.6 mg total) by mouth 2 (two) times daily.   SUMAtriptan (IMITREX) 100 MG tablet Take 100 mg by mouth every 2 (two) hours as needed for migraine or headache. May repeat in 2 hours if headache persists or recurs.   UNABLE TO FIND Diet: Regular   valbenazine (INGREZZA) 80 MG capsule Take 1 capsule (80 mg total) by mouth daily.   ZINC OXIDE EX Apply 1 Application topically See admin instructions. every shift and prn Special Instructions: Apply to sacrum, bilateral buttocks and coccyx q shift for redness.   No facility-administered encounter medications on file as of 02/21/2023.     SIGNIFICANT DIAGNOSTIC EXAMS  PREVIOUS   08-16-22: chest x-ray: 1. No acute intrathoracic process.   08-21-22: ct of head:  1. Possible sulcal effacement and loss of gray-white differentiation in the posterior right frontal lobe may represent subacute infarct versus artifact. Consider further evaluation with MRI. 2. Mild chronic small vessel ischemia.  08-22-22: MRI head:  1. No acute intracranial abnormality. 2. Findings of chronic small vessel ischemia and volume loss.  NO NEW EXAMS   LABS REVIEWED PREVIOUS   08-04-22: wbc 6.9; hgb 10.1; hct 31.8; mcv 97.5 plt 371; glucose 93; bun 15; creat 0.66; k+ 4.0; na++ 135; ca 8.8; gfr >60 mag 1.8; tsh 1.460; free  t3: 1.8; free t4: 1.02 08-14-22: wbc 9.3; hgb 10.5; hct 31.8 mcv 94.9 plt 324; glucose 93; bun 15 creat 0.66; k+ 3.6; na++ 138; ca 9.1; gfr >60 08-23-22: wbc 9.1; hgb 10.2; hct 31.8; mcv 94.9 plt 379'; glucose 100; bun 12; creat 0.61; k+ 4.3; na++ 139; ca 9.0; gfr >60 10-11-22: glucose 87; bun 16; creat 0.59; k+ 3.7; na++ 137; ca 9.4; gfr >60 10-17-22: glucose 98; bun 17; creat 0.56; k+ 4.1; na++ 138; ca 9.4; gfr >60 11-14-22: hepatitis C nr  NO NEW LABS    Review of Systems  Constitutional:  Negative for malaise/fatigue.  Respiratory:  Negative for cough and shortness of breath.   Cardiovascular:  Negative for chest pain, palpitations and leg swelling.  Gastrointestinal:  Negative for abdominal pain, constipation and heartburn.  Musculoskeletal:  Negative for back pain, joint pain and myalgias.  Skin: Negative.   Neurological:  Negative for dizziness.  Psychiatric/Behavioral:  The patient is not nervous/anxious.     Physical Exam Constitutional:      General: She is not in acute distress.    Appearance: She is well-developed. She is not diaphoretic.  Neck:     Thyroid: No thyromegaly.  Cardiovascular:     Rate and Rhythm: Normal rate and regular rhythm.     Pulses: Normal pulses.     Heart sounds: Normal heart sounds.  Pulmonary:     Effort: Pulmonary effort is normal. No respiratory distress.     Breath sounds: Normal breath sounds.  Abdominal:     General: Bowel sounds are normal. There is no distension.     Palpations: Abdomen is soft.     Tenderness: There is no abdominal tenderness.  Musculoskeletal:     Cervical back: Neck supple.     Right lower leg:  No edema.     Left lower leg: No edema.     Comments:  Is able to move all extremities    Lymphadenopathy:     Cervical: No cervical adenopathy.  Skin:    General: Skin is warm and dry.  Neurological:     Mental Status: She is alert and oriented to person, place, and time.  Psychiatric:        Mood and Affect: Mood  normal.       ASSESSMENT/ PLAN:  TODAY  Neurocognitive deficits Tardive dyskinesia Vascular dementia without behavioral disturbance  Will continue current medications Will continue current plan of care Will continue to monitor her status.   Time spent with patient: 40 minutes: medications; medication changes; plan of care; therapy.   Synthia Innocent NP [facility]  call [phone]

## 2023-02-27 ENCOUNTER — Non-Acute Institutional Stay (SKILLED_NURSING_FACILITY): Payer: PPO | Admitting: Internal Medicine

## 2023-02-27 ENCOUNTER — Ambulatory Visit: Payer: PPO | Admitting: Adult Health

## 2023-02-27 ENCOUNTER — Encounter: Payer: Self-pay | Admitting: Internal Medicine

## 2023-02-27 DIAGNOSIS — L2989 Other pruritus: Secondary | ICD-10-CM | POA: Insufficient documentation

## 2023-02-27 DIAGNOSIS — I4811 Longstanding persistent atrial fibrillation: Secondary | ICD-10-CM

## 2023-02-27 DIAGNOSIS — L298 Other pruritus: Secondary | ICD-10-CM | POA: Diagnosis not present

## 2023-02-27 DIAGNOSIS — Z8709 Personal history of other diseases of the respiratory system: Secondary | ICD-10-CM | POA: Diagnosis not present

## 2023-02-27 NOTE — Progress Notes (Addendum)
NURSING HOME LOCATION:  [facility] ROOM NUMBER:  101 W  CODE STATUS:  Full Code  PCP:  Synthia Innocent NP  This is a nursing facility follow up visit for specific acute issue of pruritic rash.  Interim medical record and care since last SNF visit was updated with review of diagnostic studies and change in clinical status since last visit were documented.  HPI: Patient reports a diffuse pruritic rash over the anterior and posterior thoracic areas for the last 48-72 hours.  There has been no change in meds or diet. She does describe itchy, watery eyes without other extrinsic symptoms.  She denies any myalgias or diarrhea. She stated her only allergy is sulfa but the allergy list includes ticlopidine , prednisone, Monistat, trazodone, and Nefazodone. She is on loratadine in addition to over a dozen other maintenance and as needed medications.  Review of systems: Constitutional: No fever, significant weight change, fatigue  Eyes: No redness, discharge, pain, vision change ENT/mouth: No nasal congestion,  purulent discharge, earache, change in hearing, sore throat  Cardiovascular: No chest pain, palpitations, paroxysmal nocturnal dyspnea, claudication, edema  Respiratory: No cough, sputum production, hemoptysis, DOE, significant snoring, apnea   Gastrointestinal: No heartburn, dysphagia, abdominal pain, nausea /vomiting, rectal bleeding, melena, change in bowels Genitourinary: No dysuria, hematuria, pyuria, incontinence, nocturia Musculoskeletal: No joint stiffness, joint swelling, weakness, pain Dermatologic: No rash, pruritus, change in appearance of skin Neurologic: No dizziness, headache, syncope, seizures, numbness, tingling Endocrine: No change in hair/skin/nails, excessive thirst, excessive hunger, excessive urination  Hematologic/lymphatic: No significant bruising, lymphadenopathy, abnormal bleeding Allergy/immunology: No significant sneezing, angioedema  Physical  exam:  Pertinent or positive findings: Facies are blank.  Affect is flat.  Eyebrows are decreased laterally.  She exhibits a fine tremor of the mandible.  Heart rate is slow and irregular.  Pedal pulses are decreased to palpation. One/half plus edema at the sock line is noted.  Heels are dressed surgically.  She has diffuse fine erythematous rash which partially blanches with pressure.  This is located diffusely over the anterior and posterior thorax from the neck to the groin.  Scratching the skin with a key results in urticarial welting.  She has interosseous wasting of the hands.  Mixed osteoarthritic changes of the hands are present.  General appearance: Adequately nourished; no acute distress, increased work of breathing is present.   Lymphatic: No lymphadenopathy about the head, neck, axilla. Eyes: No conjunctival inflammation or lid edema is present. There is no scleral icterus. Ears:  External ear exam shows no significant lesions or deformities.   Nose:  External nasal examination shows no deformity or inflammation. Nasal mucosa are pink and moist without lesions, exudates Oral exam:  Lips and gums are healthy appearing. There is no oropharyngeal erythema or exudate. Neck:  No thyromegaly, masses, tenderness noted.    Heart:  No gallop, murmur, click, rub .  Lungs: without wheezes, rhonchi, rales, rubs. Abdomen: Bowel sounds are normal. Abdomen is soft and nontender with no organomegaly, hernias, masses. GU: Deferred  Extremities:  No cyanosis, clubbing  Skin: Warm & dry w/o tenting.  See summary under each active problem in the Problem List with associated updated therapeutic plan  4/19/ @ 1:54pm Apparently she has received 1 dose of Decadron and continued the loratadine.  According to the daughter initially the rash did improve but now has extended over the posterior neck. The patient is not having any angioedema symptoms or respiratory symptoms.  Vital signs are stable as  recorded.   No lymphadenopathy is present.  Lips, tongue, and oropharynx reveal no signs of angioedema.  Chest is clear to auscultation without wheezing.  The rash is more erythematous over the posterior neck.  With pressure it does blanch. Scratch with blunt object causes pattern urticaria. I discussed with the daughter that I am concerned the rash is a reaction to one or more of the 19 drugs she is taking.  The daughter is very concerned that the antipsychotropic agents not the discontinued as she fears exacerbation of her mother's neuropsychiatric issues. The plan will be to discontinue Myrbetriq and reassess the rash over 48 hours.  Loratadine will be changed to fexofenadine. If the patient develops hypoxia, wheezing, or any signs of angioedema; she would need to go to the ED for parenteral therapy.

## 2023-02-27 NOTE — Assessment & Plan Note (Addendum)
Continue loratadine.  Add low-dose Decadron x 5 days.  CBC and differential. If rash fails to respond or progresses drug elimination trial may be necessary in the context of polypharmacy. 02/28/2023 see Addendum after re-examination & conference with daughter.Parameters for referral to ED discussed with her. I shall review UpTo Date recommendations on allergic vasculitis treatment.

## 2023-02-27 NOTE — Patient Instructions (Signed)
See assessment and plan under each diagnosis in the problem list and acutely for this visit 

## 2023-02-28 ENCOUNTER — Other Ambulatory Visit (HOSPITAL_COMMUNITY)
Admission: RE | Admit: 2023-02-28 | Discharge: 2023-02-28 | Disposition: A | Discharge status: Home / Self Care | Payer: PPO | Source: Skilled Nursing Facility | Attending: Internal Medicine | Admitting: Internal Medicine

## 2023-02-28 ENCOUNTER — Encounter: Payer: Self-pay | Admitting: Internal Medicine

## 2023-02-28 DIAGNOSIS — D72829 Elevated white blood cell count, unspecified: Secondary | ICD-10-CM | POA: Insufficient documentation

## 2023-02-28 DIAGNOSIS — R488 Other symbolic dysfunctions: Secondary | ICD-10-CM | POA: Insufficient documentation

## 2023-02-28 LAB — CBC WITH DIFFERENTIAL/PLATELET
Abs Immature Granulocytes: 0.04 10*3/uL (ref 0.00–0.07)
Basophils Absolute: 0 10*3/uL (ref 0.0–0.1)
Basophils Relative: 0 %
Eosinophils Absolute: 0 10*3/uL (ref 0.0–0.5)
Eosinophils Relative: 0 %
HCT: 37.5 % (ref 36.0–46.0)
Hemoglobin: 12.1 g/dL (ref 12.0–15.0)
Immature Granulocytes: 1 %
Lymphocytes Relative: 13 %
Lymphs Abs: 1 10*3/uL (ref 0.7–4.0)
MCH: 32.4 pg (ref 26.0–34.0)
MCHC: 32.3 g/dL (ref 30.0–36.0)
MCV: 100.5 fL — ABNORMAL HIGH (ref 80.0–100.0)
Monocytes Absolute: 0.3 10*3/uL (ref 0.1–1.0)
Monocytes Relative: 4 %
Neutro Abs: 6.3 10*3/uL (ref 1.7–7.7)
Neutrophils Relative %: 82 %
Platelets: 216 10*3/uL (ref 150–400)
RBC: 3.73 MIL/uL — ABNORMAL LOW (ref 3.87–5.11)
RDW: 13.2 % (ref 11.5–15.5)
WBC: 7.6 10*3/uL (ref 4.0–10.5)
nRBC: 0 % (ref 0.0–0.2)

## 2023-02-28 NOTE — Assessment & Plan Note (Signed)
Rhythm irregular but rate well controlled.No change indicated.

## 2023-02-28 NOTE — Assessment & Plan Note (Signed)
Despite pruritic rash ,chest is clear w/o wheezing.

## 2023-02-28 NOTE — Addendum Note (Signed)
Addended byPecola Lawless on: 02/28/2023 02:14 PM   Modules accepted: Level of Service

## 2023-03-04 ENCOUNTER — Encounter: Payer: Self-pay | Admitting: Adult Health

## 2023-03-04 ENCOUNTER — Non-Acute Institutional Stay (SKILLED_NURSING_FACILITY): Payer: PPO | Admitting: Adult Health

## 2023-03-04 DIAGNOSIS — L298 Other pruritus: Secondary | ICD-10-CM

## 2023-03-04 NOTE — Progress Notes (Signed)
Location:  [facility] Room Number: 101-W Place of Service:  SNF (31) Provider: Synthia Innocent, NP  CODE STATUS: FULL CODE  Allergies  Allergen Reactions   Ticlid [Ticlopidine] Shortness Of Breath   Nsaids Other (See Comments)    Bleeding ulcer   Prednisone Other (See Comments)    Makes her stomach hurt--knows this isn't an allergy but doesn't want to take it    Trazodone And Nefazodone     hjallucinations   Monistat [Miconazole] Swelling and Rash   Sulfa Antibiotics Rash    Chief Complaint  Patient presents with   Acute Visit    Follow up on rash.     HPI:  At the end of last week; she developed a generalized rash; a diffuse pruritic rash over the anterior and posterior thoracic area. She is taking many different medications. This makes it very difficult to determine what could be causing this rash. The rash was itchy as well. It was decided to stop the mybetriq to determine if this medication was the cause Today her rash is nearly resolved and is no longer itching.     Past Medical History:  Diagnosis Date   Anemia    Arthritis    Asthma    Atrial fibrillation    COVID-19 08/05/2022   Depression    H/O head injury    Hearing loss    Heart murmur    can be heard at times, Doctor said not to worry   History of kidney stones    History of stomach ulcers    Migraine    Pneumonia    Psittacosis    Stroke    Weakness of neck 03/02/2013    Past Surgical History:  Procedure Laterality Date   ANKLE FRACTURE SURGERY Left    APPENDECTOMY     BREAST LUMPECTOMY     CESAREAN SECTION     x 3   I & D KNEE WITH POLY EXCHANGE Left 08/16/2022   Procedure: KNEE POLY EXCHANGE;  Surgeon: Bjorn Pippin, MD;  Location: [facility] OR;  Service: Orthopedics;  Laterality: Left;   KNEE ARTHROSCOPY WITH PATELLAR TENDON REPAIR Left 08/16/2022   Procedure: IRRIGATION AND DEBRIDEMENT LEFT KNEE REVISION WITH PATELLAR TENDON REPAIR and poly exchange;  Surgeon: Bjorn Pippin, MD;  Location: [facility] OR;  Service: Orthopedics;  Laterality: Left;   PARTIAL HYSTERECTOMY     ROTATOR CUFF REPAIR Right    TOTAL KNEE ARTHROPLASTY Left 07/22/2022   Procedure: TOTAL KNEE ARTHROPLASTY;  Surgeon: Joen Laura, MD;  Location: [facility] ORS;  Service: Orthopedics;  Laterality: Left;    Social History   Socioeconomic History   Marital status: Married    Spouse name: Not on file   Number of children: 3   Years of education: Not on file   Highest education level: Not on file  Occupational History   Occupation: retired  Tobacco Use   Smoking status: Never   Smokeless tobacco: Never  Vaping Use   Vaping Use: Never used  Substance and Sexual Activity   Alcohol use: Not Currently    Comment: occ beer   Drug use: No   Sexual activity: Yes    Birth control/protection: Surgical    Comment: hyst  Other Topics Concern   Not on file  Social History Narrative   Not on file   Social Determinants of Health   Financial Resource Strain: Medium Risk (01/30/2021)   Overall Financial Resource Strain (CARDIA)  Difficulty of Paying Living Expenses: Somewhat hard  Food Insecurity: No Food Insecurity (08/15/2022)   Hunger Vital Sign    Worried About Running Out of Food in the Last Year: Never true    Ran Out of Food in the Last Year: Never true  Transportation Needs: No Transportation Needs (08/15/2022)   PRAPARE - Administrator, Civil Service (Medical): No    Lack of Transportation (Non-Medical): No  Physical Activity: Insufficiently Active (01/30/2021)   Exercise Vital Sign    Days of Exercise per Week: 2 days    Minutes of Exercise per Session: 10 min  Stress: No Stress Concern Present (01/30/2021)   [employer] of Occupational Health - Occupational Stress Questionnaire    Feeling of Stress : Only a little  Social Connections: Moderately Integrated (01/30/2021)   Social Connection and Isolation Panel [NHANES]    Frequency of Communication with Friends and  Family: Twice a week    Frequency of Social Gatherings with Friends and Family: Twice a week    Attends Religious Services: More than 4 times per year    Active Member of [employer] or Organizations: No    Attends Banker Meetings: Never    Marital Status: Married  Catering manager Violence: Not At Risk (08/15/2022)   Humiliation, Afraid, Rape, and Kick questionnaire    Fear of Current or Ex-Partner: No    Emotionally Abused: No    Physically Abused: No    Sexually Abused: No   Family History  Problem Relation Age of Onset   Cancer Father    Heart disease Brother    Alcohol abuse Brother    Depression Maternal Grandmother    Depression Grandchild    Breast cancer Daughter       VITAL SIGNS BP 122/78   Pulse 84   Temp (!) 96.2 F (35.7 C)   Resp 20   Ht 5\' 5"  (1.651 m)   Wt 176 lb (79.8 kg)   SpO2 93%   BMI 29.29 kg/m   Outpatient Encounter Medications as of 03/04/2023  Medication Sig   acetaminophen (TYLENOL) 650 MG CR tablet Take 650 mg by mouth at bedtime.   apixaban (ELIQUIS) 5 MG TABS tablet Take 1 tablet (5 mg total) by mouth 2 (two) times daily.   ARIPiprazole (ABILIFY) 10 MG tablet Take 10 mg by mouth daily.   busPIRone (BUSPAR) 30 MG tablet Take 1 tablet (30 mg total) by mouth 2 (two) times daily.   donepezil (ARICEPT) 10 MG tablet Take 10 mg by mouth at bedtime.   DULoxetine (CYMBALTA) 60 MG capsule Take 60 mg by mouth daily.   eszopiclone (LUNESTA) 1 MG TABS tablet Take 1 tablet (1 mg total) by mouth at bedtime. Take immediately before bedtime   fexofenadine (ALLEGRA) 180 MG tablet Take 180 mg by mouth daily.   furosemide (LASIX) 20 MG tablet Take 20 mg by mouth.   metoprolol tartrate (LOPRESSOR) 25 MG tablet Take 1 tablet (25 mg total) by mouth 2 (two) times daily.   modafinil (PROVIGIL) 200 MG tablet Take 100 mg by mouth daily.   NUTRITIONAL SUPPLEMENTS PO Take 120 mLs by mouth 2 (two) times daily between meals. Med Pass   Omeprazole 20 MG TBDD  Take 20 mg by mouth daily as needed.   polyethylene glycol (MIRALAX / GLYCOLAX) 17 g packet Take 17 g by mouth daily as needed.   senna (SENOKOT) 8.6 MG TABS tablet Take 1 tablet (8.6 mg total) by mouth  2 (two) times daily.   SUMAtriptan (IMITREX) 100 MG tablet Take 100 mg by mouth every 2 (two) hours as needed for migraine or headache. May repeat in 2 hours if headache persists or recurs.   UNABLE TO FIND Diet: Regular   valbenazine (INGREZZA) 80 MG capsule Take 1 capsule (80 mg total) by mouth daily.   ZINC OXIDE EX Apply 1 Application topically See admin instructions. every shift and prn Special Instructions: Apply to sacrum, bilateral buttocks and coccyx q shift for redness.   [DISCONTINUED] acetaminophen (TYLENOL) 500 MG tablet Take 1,000 mg by mouth every 8 (eight) hours as needed for headache or mild pain.   [DISCONTINUED] Calcium Carb-Cholecalciferol (CALCIUM CARBONATE+VITAMIN D PO) Take by mouth.   [DISCONTINUED] loratadine (CLARITIN) 10 MG tablet Take 10 mg by mouth daily.   [DISCONTINUED] MYRBETRIQ 50 MG TB24 tablet Take 50 mg by mouth daily.   No facility-administered encounter medications on file as of 03/04/2023.     SIGNIFICANT DIAGNOSTIC EXAMS  PREVIOUS   08-16-22: chest x-ray: 1. No acute intrathoracic process.   08-21-22: ct of head:  1. Possible sulcal effacement and loss of gray-white differentiation in the posterior right frontal lobe may represent subacute infarct versus artifact. Consider further evaluation with MRI. 2. Mild chronic small vessel ischemia.  08-22-22: MRI head:  1. No acute intracranial abnormality. 2. Findings of chronic small vessel ischemia and volume loss.  NO NEW EXAMS   LABS REVIEWED PREVIOUS   08-04-22: wbc 6.9; hgb 10.1; hct 31.8; mcv 97.5 plt 371; glucose 93; bun 15; creat 0.66; k+ 4.0; na++ 135; ca 8.8; gfr >60 mag 1.8; tsh 1.460; free t3: 1.8; free t4: 1.02 08-14-22: wbc 9.3; hgb 10.5; hct 31.8 mcv 94.9 plt 324; glucose 93; bun 15 creat  0.66; k+ 3.6; na++ 138; ca 9.1; gfr >60 08-23-22: wbc 9.1; hgb 10.2; hct 31.8; mcv 94.9 plt 379'; glucose 100; bun 12; creat 0.61; k+ 4.3; na++ 139; ca 9.0; gfr >60 10-11-22: glucose 87; bun 16; creat 0.59; k+ 3.7; na++ 137; ca 9.4; gfr >60 10-17-22: glucose 98; bun 17; creat 0.56; k+ 4.1; na++ 138; ca 9.4; gfr >60 11-14-22: hepatitis C nr  NO NEW LABS     Review of Systems  Constitutional:  Negative for malaise/fatigue.  Respiratory:  Negative for cough and shortness of breath.   Cardiovascular:  Negative for chest pain, palpitations and leg swelling.  Gastrointestinal:  Negative for abdominal pain, constipation and heartburn.  Musculoskeletal:  Negative for back pain, joint pain and myalgias.  Skin:  Positive for rash.       Nearly gone  Neurological:  Negative for dizziness.  Psychiatric/Behavioral:  The patient is not nervous/anxious.    Physical Exam Constitutional:      General: She is not in acute distress.    Appearance: She is well-developed. She is not diaphoretic.  Neck:     Thyroid: No thyromegaly.  Cardiovascular:     Rate and Rhythm: Normal rate and regular rhythm.     Pulses: Normal pulses.     Heart sounds: Normal heart sounds.  Pulmonary:     Effort: Pulmonary effort is normal. No respiratory distress.     Breath sounds: Normal breath sounds.  Abdominal:     General: Bowel sounds are normal. There is no distension.     Palpations: Abdomen is soft.     Tenderness: There is no abdominal tenderness.  Musculoskeletal:        General: Normal range of motion.  Cervical back: Neck supple.     Right lower leg: No edema.     Left lower leg: No edema.     Comments: Has mild dyskinesia movements   Lymphadenopathy:     Cervical: No cervical adenopathy.  Skin:    General: Skin is warm and dry.     Comments: Rash has nearly resolved   Neurological:     Mental Status: She is alert. Mental status is at baseline.  Psychiatric:        Mood and Affect: Mood normal.        ASSESSMENT/ PLAN:  TODAY  Pruritic erythematous rash: has nearly resolved; will place myrbetriq on allergy list.    Synthia Innocent NP [facility]  call [phone]

## 2023-03-06 ENCOUNTER — Ambulatory Visit (INDEPENDENT_AMBULATORY_CARE_PROVIDER_SITE_OTHER): Payer: PPO | Admitting: Neurology

## 2023-03-06 DIAGNOSIS — R202 Paresthesia of skin: Secondary | ICD-10-CM

## 2023-03-06 NOTE — Procedures (Signed)
  [facility]  [address] [place], [address]  [place], [place] [address] Tel: [phone] Fax: [phone] Test Date:  03/06/2023  Patient: [name] DOB: Jul 14, 1946 Physician: Nita Sickle, DO  Sex: Female Height:  Ref Phys: Dub Mikes, MD  ID#: [id number]   Technician:    History: This is a 77 year old female referred for evaluation of right foot contracture.  NCV & EMG Findings: Extensive electrodiagnostic testing of the right lower extremity is somewhat limited by patient positioning.  Findings are as follows: Right sural and superficial peroneal sensory responses are absent. Right peroneal motor response at the extensor digitorum brevis is absent, and normal at the tibialis anterior.  Right tibial motor responses within normal limits. Needle electrode examination this limited, as patient is on anticoagulation therapy and proximal and deep muscles were unable to be tested.  Reduced activation is seen in the right gastrocnemius and rectus femoris muscles with normal-appearing motor unit configuration.  Impression: The electrophysiologic findings are consistent with a sensorimotor polyneuropathy affecting the right lower extremity.  Needle electrode exam is very limited due to patient positioning and being on anticoagulation therapy.  Neurogenic changes are not present in the tested muscles, however there is evidence of incomplete motor unit activation which may be due to poor effort, pain, or central disorder of motor unit control.  Correlate clinically.   ___________________________ Nita Sickle, DO    Nerve Conduction Studies   Stim Site NR Peak (ms) Norm Peak (ms) O-P Amp (V) Norm O-P Amp  Right Sup Peroneal Anti Sensory (Ant Lat Mall)  32 C  12 cm *NR  <4.6  >3  Right Sural Anti Sensory (Lat Mall)  32 C  Calf *NR  <4.6  >3     Stim Site NR Onset (ms) Norm Onset (ms) O-P Amp (mV) Norm O-P Amp Site1 Site2 Delta-0 (ms) Dist (cm) Vel (m/s) Norm Vel  (m/s)  Right Peroneal Motor (Ext Dig Brev)  32 C  Ankle *NR  <6.0  >2.5 B Fib Ankle  0.0  >40  B Fib *NR     Poplt B Fib  0.0  >40  Poplt *NR            Right Peroneal TA Motor (Tib Ant)  32 C  Fib Head    3.2 <4.5 4.4 >3 Poplit Fib Head 1.5 8.0 53 >40  Poplit    4.7 <5.7 4.2         Right Tibial Motor (Abd Hall Brev)  32 C  Ankle    5.0 <6.0 4.2 >4 Knee Ankle 8.8 40.0 45 >40  Knee    13.8  3.1          Electromyography   Side Muscle Ins.Act Fibs Fasc Recrt Amp Dur Poly Activation Comment  Right AntTibialis Nml Nml Nml Nml Nml Nml Nml Nml N/A  Right Gastroc Nml Nml Nml *3- Nml Nml Nml *Variable N/A  Right RectFemoris Nml Nml Nml *3- Nml Nml Nml *Variable N/A      Waveforms:

## 2023-03-11 ENCOUNTER — Other Ambulatory Visit: Payer: Self-pay | Admitting: Adult Health

## 2023-03-11 MED ORDER — MODAFINIL 200 MG PO TABS: 100.0000 mg | ORAL_TABLET | Freq: Every day | ORAL | 0 refills | Status: DC

## 2023-03-12 DIAGNOSIS — G2401 Drug induced subacute dyskinesia: Secondary | ICD-10-CM | POA: Diagnosis not present

## 2023-03-12 DIAGNOSIS — F411 Generalized anxiety disorder: Secondary | ICD-10-CM | POA: Diagnosis not present

## 2023-03-12 DIAGNOSIS — F332 Major depressive disorder, recurrent severe without psychotic features: Secondary | ICD-10-CM | POA: Diagnosis not present

## 2023-03-13 ENCOUNTER — Encounter: Payer: PPO | Admitting: Neurology

## 2023-03-18 DIAGNOSIS — M170 Bilateral primary osteoarthritis of knee: Secondary | ICD-10-CM | POA: Diagnosis not present

## 2023-03-18 DIAGNOSIS — L603 Nail dystrophy: Secondary | ICD-10-CM | POA: Diagnosis not present

## 2023-03-18 DIAGNOSIS — I739 Peripheral vascular disease, unspecified: Secondary | ICD-10-CM | POA: Diagnosis not present

## 2023-03-19 ENCOUNTER — Other Ambulatory Visit: Payer: Self-pay | Admitting: Adult Health

## 2023-03-19 MED ORDER — ESZOPICLONE 1 MG PO TABS: 1.0000 mg | ORAL_TABLET | Freq: Every day | ORAL | 0 refills | Status: DC

## 2023-03-20 ENCOUNTER — Encounter: Payer: Self-pay | Admitting: Adult Health

## 2023-03-20 ENCOUNTER — Non-Acute Institutional Stay (SKILLED_NURSING_FACILITY): Payer: PPO | Admitting: Adult Health

## 2023-03-20 DIAGNOSIS — F5104 Psychophysiologic insomnia: Secondary | ICD-10-CM

## 2023-03-20 DIAGNOSIS — F0390 Unspecified dementia without behavioral disturbance: Secondary | ICD-10-CM

## 2023-03-20 DIAGNOSIS — D649 Anemia, unspecified: Secondary | ICD-10-CM | POA: Diagnosis not present

## 2023-03-20 DIAGNOSIS — F323 Major depressive disorder, single episode, severe with psychotic features: Secondary | ICD-10-CM | POA: Diagnosis not present

## 2023-03-20 NOTE — Progress Notes (Signed)
Location:  [facility] Room Number: 82 W Place of Service:  SNF (31)   CODE STATUS: Full Code   Allergies  Allergen Reactions   Ticlid [Ticlopidine] Shortness Of Breath   Nsaids Other (See Comments)    Bleeding ulcer   Prednisone Other (See Comments)    Makes her stomach hurt--knows this isn't an allergy but doesn't want to take it    Trazodone And Nefazodone     hjallucinations   Monistat [Miconazole] Swelling and Rash   Myrbetriq Theodosia Paling Er] Rash   Sulfa Antibiotics Rash    Chief Complaint  Patient presents with   Medical Management of Chronic Issues                       Chronic anemia: Chronic insomnia:  Psychosis in elderly/major depression with psychotic features     HPI:  She is a 77 year old long term resident of this facility being seen for the management of her chronic illnesses:  Chronic anemia: Chronic insomnia:  Psychosis in elderly/major depression with psychotic features. There are no reports of uncontrolled pain. Her psychosis is well managed at this time   Past Medical History:  Diagnosis Date   Anemia    Arthritis    Asthma    Atrial fibrillation (HCC)    COVID-19 08/05/2022   Depression    H/O head injury    Hearing loss    Heart murmur    can be heard at times, Doctor said not to worry   History of kidney stones    History of stomach ulcers    Migraine    Pneumonia    Psittacosis    Stroke (HCC)    Weakness of neck 03/02/2013    Past Surgical History:  Procedure Laterality Date   ANKLE FRACTURE SURGERY Left    APPENDECTOMY     BREAST LUMPECTOMY     CESAREAN SECTION     x 3   I & D KNEE WITH POLY EXCHANGE Left 08/16/2022   Procedure: KNEE POLY EXCHANGE;  Surgeon: Bjorn Pippin, MD;  Location: [facility] OR;  Service: Orthopedics;  Laterality: Left;   KNEE ARTHROSCOPY WITH PATELLAR TENDON REPAIR Left 08/16/2022   Procedure: IRRIGATION AND DEBRIDEMENT LEFT KNEE REVISION WITH PATELLAR TENDON REPAIR and poly exchange;   Surgeon: Bjorn Pippin, MD;  Location: [facility] OR;  Service: Orthopedics;  Laterality: Left;   PARTIAL HYSTERECTOMY     ROTATOR CUFF REPAIR Right    TOTAL KNEE ARTHROPLASTY Left 07/22/2022   Procedure: TOTAL KNEE ARTHROPLASTY;  Surgeon: Joen Laura, MD;  Location: [facility]S;  Service: Orthopedics;  Laterality: Left;    Social History   Socioeconomic History   Marital status: Married    Spouse name: Not on file   Number of children: 3   Years of education: Not on file   Highest education level: Not on file  Occupational History   Occupation: retired  Tobacco Use   Smoking status: Never   Smokeless tobacco: Never  Vaping Use   Vaping Use: Never used  Substance and Sexual Activity   Alcohol use: Not Currently    Comment: occ beer   Drug use: No   Sexual activity: Yes    Birth control/protection: Surgical    Comment: hyst  Other Topics Concern   Not on file  Social History Narrative   Not on file   Social Determinants of Health   Financial Resource Strain: Medium  Risk (01/30/2021)   Overall Financial Resource Strain (CARDIA)    Difficulty of Paying Living Expenses: Somewhat hard  Food Insecurity: No Food Insecurity (08/15/2022)   Hunger Vital Sign    Worried About Running Out of Food in the Last Year: Never true    Ran Out of Food in the Last Year: Never true  Transportation Needs: No Transportation Needs (08/15/2022)   PRAPARE - Administrator, Civil Service (Medical): No    Lack of Transportation (Non-Medical): No  Physical Activity: Insufficiently Active (01/30/2021)   Exercise Vital Sign    Days of Exercise per Week: 2 days    Minutes of Exercise per Session: 10 min  Stress: No Stress Concern Present (01/30/2021)   [employer] of Occupational Health - Occupational Stress Questionnaire    Feeling of Stress : Only a little  Social Connections: Moderately Integrated (01/30/2021)   Social Connection and Isolation Panel [NHANES]    Frequency of  Communication with Friends and Family: Twice a week    Frequency of Social Gatherings with Friends and Family: Twice a week    Attends Religious Services: More than 4 times per year    Active Member of [employer] or Organizations: No    Attends Banker Meetings: Never    Marital Status: Married  Catering manager Violence: Not At Risk (08/15/2022)   Humiliation, Afraid, Rape, and Kick questionnaire    Fear of Current or Ex-Partner: No    Emotionally Abused: No    Physically Abused: No    Sexually Abused: No   Family History  Problem Relation Age of Onset   Cancer Father    Heart disease Brother    Alcohol abuse Brother    Depression Maternal Grandmother    Depression Grandchild    Breast cancer Daughter       VITAL SIGNS BP 115/73   Pulse 64   Temp 98.3 F (36.8 C)   Ht 5\' 5"  (1.651 m)   Wt 178 lb 3.2 oz (80.8 kg)   SpO2 98%   BMI 29.65 kg/m   Outpatient Encounter Medications as of 03/20/2023  Medication Sig   acetaminophen (TYLENOL) 650 MG CR tablet Take 650 mg by mouth at bedtime.   apixaban (ELIQUIS) 5 MG TABS tablet Take 1 tablet (5 mg total) by mouth 2 (two) times daily.   ARIPiprazole (ABILIFY) 10 MG tablet Take 10 mg by mouth daily.   busPIRone (BUSPAR) 30 MG tablet Take 1 tablet (30 mg total) by mouth 2 (two) times daily.   donepezil (ARICEPT) 10 MG tablet Take 10 mg by mouth at bedtime.   DULoxetine (CYMBALTA) 60 MG capsule Take 60 mg by mouth daily.   eszopiclone (LUNESTA) 1 MG TABS tablet Take 1 tablet (1 mg total) by mouth at bedtime. Take immediately before bedtime   fexofenadine (ALLEGRA) 180 MG tablet Take 180 mg by mouth daily.   furosemide (LASIX) 20 MG tablet Take 20 mg by mouth.   metoprolol tartrate (LOPRESSOR) 25 MG tablet Take 1 tablet (25 mg total) by mouth 2 (two) times daily.   modafinil (PROVIGIL) 200 MG tablet Take 0.5 tablets (100 mg total) by mouth daily.   Nutritional Supplements (ENSURE ENLIVE PO) Take 1 Can by mouth 2 (two) times  daily.   Omeprazole 20 MG TBDD Take 20 mg by mouth daily as needed.   polyethylene glycol (MIRALAX / GLYCOLAX) 17 g packet Take 17 g by mouth daily as needed.   senna (SENOKOT) 8.6 MG  TABS tablet Take 1 tablet (8.6 mg total) by mouth 2 (two) times daily.   SUMAtriptan (IMITREX) 100 MG tablet Take 100 mg by mouth every 2 (two) hours as needed for migraine or headache. May repeat in 2 hours if headache persists or recurs.   UNABLE TO FIND Diet: Regular   valbenazine (INGREZZA) 80 MG capsule Take 1 capsule (80 mg total) by mouth daily.   Vitamins A & D (VITAMIN A & D) ointment Apply 1 Application topically 2 (two) times daily. To left heel   ZINC OXIDE EX Apply 1 Application topically See admin instructions. every shift and prn Special Instructions: Apply to sacrum, bilateral buttocks and coccyx q shift for redness.   [DISCONTINUED] NUTRITIONAL SUPPLEMENTS PO Take 120 mLs by mouth 2 (two) times daily between meals. Med Pass   No facility-administered encounter medications on file as of 03/20/2023.     SIGNIFICANT DIAGNOSTIC EXAMS  PREVIOUS   08-16-22: chest x-ray: 1. No acute intrathoracic process.   08-21-22: ct of head:  1. Possible sulcal effacement and loss of gray-white differentiation in the posterior right frontal lobe may represent subacute infarct versus artifact. Consider further evaluation with MRI. 2. Mild chronic small vessel ischemia.  08-22-22: MRI head:  1. No acute intracranial abnormality. 2. Findings of chronic small vessel ischemia and volume loss.  NO NEW EXAMS   LABS REVIEWED PREVIOUS   08-04-22: wbc 6.9; hgb 10.1; hct 31.8; mcv 97.5 plt 371; glucose 93; bun 15; creat 0.66; k+ 4.0; na++ 135; ca 8.8; gfr >60 mag 1.8; tsh 1.460; free t3: 1.8; free t4: 1.02 08-14-22: wbc 9.3; hgb 10.5; hct 31.8 mcv 94.9 plt 324; glucose 93; bun 15 creat 0.66; k+ 3.6; na++ 138; ca 9.1; gfr >60 08-23-22: wbc 9.1; hgb 10.2; hct 31.8; mcv 94.9 plt 379'; glucose 100; bun 12; creat 0.61; k+  4.3; na++ 139; ca 9.0; gfr >60 10-11-22: glucose 87; bun 16; creat 0.59; k+ 3.7; na++ 137; ca 9.4; gfr >60 10-17-22: glucose 98; bun 17; creat 0.56; k+ 4.1; na++ 138; ca 9.4; gfr >60 11-14-22: hepatitis C nr  TODAY  02-28-23: wbc 7.6; hgb 12.1; hct 375; mcv 100.5 plt 216    Review of Systems  Constitutional:  Negative for malaise/fatigue.  Respiratory:  Negative for cough and shortness of breath.   Cardiovascular:  Negative for chest pain, palpitations and leg swelling.  Gastrointestinal:  Negative for abdominal pain, constipation and heartburn.  Musculoskeletal:  Negative for back pain, joint pain and myalgias.  Skin: Negative.   Neurological:  Negative for dizziness.  Psychiatric/Behavioral:  The patient is not nervous/anxious.    Physical Exam Constitutional:      General: She is not in acute distress.    Appearance: She is well-developed. She is not diaphoretic.  Neck:     Thyroid: No thyromegaly.  Cardiovascular:     Rate and Rhythm: Normal rate and regular rhythm.     Pulses: Normal pulses.     Heart sounds: Normal heart sounds.  Pulmonary:     Effort: Pulmonary effort is normal. No respiratory distress.     Breath sounds: Normal breath sounds.  Abdominal:     General: Bowel sounds are normal. There is no distension.     Palpations: Abdomen is soft.     Tenderness: There is no abdominal tenderness.  Musculoskeletal:        General: Normal range of motion.     Cervical back: Neck supple.     Right lower leg: No  edema.     Left lower leg: No edema.     Comments: Has dyskinesia movements    Lymphadenopathy:     Cervical: No cervical adenopathy.  Skin:    General: Skin is warm and dry.  Neurological:     Mental Status: She is alert. Mental status is at baseline.  Psychiatric:        Mood and Affect: Mood normal.         ASSESSMENT/ PLAN:  TODAY  Chronic anemia: hgb 12.1  2. Chronic insomnia: will continue lunesta 1 mg nightly   3. Psychosis in elderly/major  depression with psychotic features: will continue abilify 10 mg daily cymbalta 60 mg daily buspar 30 mg twice daily   PREVIOUS   4. Somnolence: will continue provigil 100 mg daily   5. Protein calorie malnutrition will continue ensure twice daily   6. Urinary incontinence: is off myretriq due to rash   7. Longstanding persistent atrial fibrillation/history of cva/history of dvt: heart rate is stable will continue eliquis 5 mg twice daily take lopressor 25 mg twice daily for rate control   8.  Migraine without status migrainosus; non-intractable unspecified type: will continue imitrex 100 mg twice daily as needed   9. Uncomplicated asthma unspecified asthma severity unspecified; unspecified whether persistent: is stable  10. Chronic constipation: will continue senna twice daily and has miralax daily as needed  11. Vascular dementia without behavioral disturbance/neurocognitive deficit: weight is 178 pounds; will continue aricept 10 mg nightly   12. Tardive dyskinesia: does continue to abnormal facial movements; will continue ingrezza 80 mg daily    13. Chronic pain syndrome/periprosthetic fracture around internal prosthetic left knee joint sequela: is taking cymbalta 60 mg daily   14. Bilateral lower extremity edema: will continue lasix 20 mg daily       Synthia Innocent NP [facility]  call [phone]

## 2023-03-21 ENCOUNTER — Non-Acute Institutional Stay (SKILLED_NURSING_FACILITY): Payer: PPO | Admitting: Adult Health

## 2023-03-21 ENCOUNTER — Encounter: Payer: Self-pay | Admitting: Adult Health

## 2023-03-21 DIAGNOSIS — G2401 Drug induced subacute dyskinesia: Secondary | ICD-10-CM | POA: Diagnosis not present

## 2023-03-21 NOTE — Progress Notes (Signed)
Location:  [facility] Room Number: 41 W Place of Service:  SNF (31)   CODE STATUS: DNR  Allergies  Allergen Reactions   Ticlid [Ticlopidine] Shortness Of Breath   Nsaids Other (See Comments)    Bleeding ulcer   Prednisone Other (See Comments)    Makes her stomach hurt--knows this isn't an allergy but doesn't want to take it    Trazodone And Nefazodone     hjallucinations   Monistat [Miconazole] Swelling and Rash   Myrbetriq Theodosia Paling Er] Rash   Sulfa Antibiotics Rash    Chief Complaint  Patient presents with   Acute Visit    Tardive dyskinesia    HPI:  She has tardive dyskinesia and is presently taking ingrezza. She is not getting relief for her symptoms she has mouth movements present. She would like to try austeda.   Past Medical History:  Diagnosis Date   Anemia    Arthritis    Asthma    Atrial fibrillation (HCC)    COVID-19 08/05/2022   Depression    H/O head injury    Hearing loss    Heart murmur    can be heard at times, Doctor said not to worry   History of kidney stones    History of stomach ulcers    Migraine    Pneumonia    Psittacosis    Stroke (HCC)    Weakness of neck 03/02/2013    Past Surgical History:  Procedure Laterality Date   ANKLE FRACTURE SURGERY Left    APPENDECTOMY     BREAST LUMPECTOMY     CESAREAN SECTION     x 3   I & D KNEE WITH POLY EXCHANGE Left 08/16/2022   Procedure: KNEE POLY EXCHANGE;  Surgeon: Bjorn Pippin, MD;  Location: [facility] OR;  Service: Orthopedics;  Laterality: Left;   KNEE ARTHROSCOPY WITH PATELLAR TENDON REPAIR Left 08/16/2022   Procedure: IRRIGATION AND DEBRIDEMENT LEFT KNEE REVISION WITH PATELLAR TENDON REPAIR and poly exchange;  Surgeon: Bjorn Pippin, MD;  Location: [facility] OR;  Service: Orthopedics;  Laterality: Left;   PARTIAL HYSTERECTOMY     ROTATOR CUFF REPAIR Right    TOTAL KNEE ARTHROPLASTY Left 07/22/2022   Procedure: TOTAL KNEE ARTHROPLASTY;  Surgeon: Joen Laura, MD;   Location: [facility];  Service: Orthopedics;  Laterality: Left;    Social History   Socioeconomic History   Marital status: Married    Spouse name: Not on file   Number of children: 3   Years of education: Not on file   Highest education level: Not on file  Occupational History   Occupation: retired  Tobacco Use   Smoking status: Never   Smokeless tobacco: Never  Vaping Use   Vaping Use: Never used  Substance and Sexual Activity   Alcohol use: Not Currently    Comment: occ beer   Drug use: No   Sexual activity: Yes    Birth control/protection: Surgical    Comment: hyst  Other Topics Concern   Not on file  Social History Narrative   Not on file   Social Determinants of Health   Financial Resource Strain: Medium Risk (01/30/2021)   Overall Financial Resource Strain (CARDIA)    Difficulty of Paying Living Expenses: Somewhat hard  Food Insecurity: No Food Insecurity (08/15/2022)   Hunger Vital Sign    Worried About Running Out of Food in the Last Year: Never true    Ran Out of Food in the Last  Year: Never true  Transportation Needs: No Transportation Needs (08/15/2022)   PRAPARE - Administrator, Civil Service (Medical): No    Lack of Transportation (Non-Medical): No  Physical Activity: Insufficiently Active (01/30/2021)   Exercise Vital Sign    Days of Exercise per Week: 2 days    Minutes of Exercise per Session: 10 min  Stress: No Stress Concern Present (01/30/2021)   [employer] of Occupational Health - Occupational Stress Questionnaire    Feeling of Stress : Only a little  Social Connections: Moderately Integrated (01/30/2021)   Social Connection and Isolation Panel [NHANES]    Frequency of Communication with Friends and Family: Twice a week    Frequency of Social Gatherings with Friends and Family: Twice a week    Attends Religious Services: More than 4 times per year    Active Member of [employer] or Organizations: No    Attends Banker  Meetings: Never    Marital Status: Married  Catering manager Violence: Not At Risk (08/15/2022)   Humiliation, Afraid, Rape, and Kick questionnaire    Fear of Current or Ex-Partner: No    Emotionally Abused: No    Physically Abused: No    Sexually Abused: No   Family History  Problem Relation Age of Onset   Cancer Father    Heart disease Brother    Alcohol abuse Brother    Depression Maternal Grandmother    Depression Grandchild    Breast cancer Daughter       VITAL SIGNS BP 110/68   Pulse 72   Temp 98.3 F (36.8 C) (Temporal)   Resp 19   Ht 5\' 5"  (1.651 m)   Wt 178 lb 3.2 oz (80.8 kg)   SpO2 98%   BMI 29.65 kg/m   Outpatient Encounter Medications as of 03/21/2023  Medication Sig   acetaminophen (TYLENOL) 650 MG CR tablet Take 650 mg by mouth at bedtime.   apixaban (ELIQUIS) 5 MG TABS tablet Take 1 tablet (5 mg total) by mouth 2 (two) times daily.   ARIPiprazole (ABILIFY) 10 MG tablet Take 10 mg by mouth daily.   busPIRone (BUSPAR) 30 MG tablet Take 1 tablet (30 mg total) by mouth 2 (two) times daily.   donepezil (ARICEPT) 10 MG tablet Take 10 mg by mouth at bedtime.   DULoxetine (CYMBALTA) 60 MG capsule Take 60 mg by mouth daily.   eszopiclone (LUNESTA) 1 MG TABS tablet Take 1 tablet (1 mg total) by mouth at bedtime. Take immediately before bedtime   fexofenadine (ALLEGRA) 180 MG tablet Take 180 mg by mouth daily.   furosemide (LASIX) 20 MG tablet Take 20 mg by mouth.   metoprolol tartrate (LOPRESSOR) 25 MG tablet Take 1 tablet (25 mg total) by mouth 2 (two) times daily.   modafinil (PROVIGIL) 200 MG tablet Take 0.5 tablets (100 mg total) by mouth daily.   Nutritional Supplements (ENSURE ENLIVE PO) Take 1 Can by mouth 2 (two) times daily.   Omeprazole 20 MG TBDD Take 20 mg by mouth daily as needed.   polyethylene glycol (MIRALAX / GLYCOLAX) 17 g packet Take 17 g by mouth daily as needed.   senna (SENOKOT) 8.6 MG TABS tablet Take 1 tablet (8.6 mg total) by mouth 2 (two)  times daily.   SUMAtriptan (IMITREX) 100 MG tablet Take 100 mg by mouth every 2 (two) hours as needed for migraine or headache. May repeat in 2 hours if headache persists or recurs.   UNABLE TO FIND  Diet: Regular   valbenazine (INGREZZA) 80 MG capsule Take 1 capsule (80 mg total) by mouth daily.   Vitamins A & D (VITAMIN A & D) ointment Apply 1 Application topically 2 (two) times daily. To left heel   ZINC OXIDE EX Apply 1 Application topically See admin instructions. every shift and prn Special Instructions: Apply to sacrum, bilateral buttocks and coccyx q shift for redness.   No facility-administered encounter medications on file as of 03/21/2023.     SIGNIFICANT DIAGNOSTIC EXAMS  PREVIOUS   08-16-22: chest x-ray: 1. No acute intrathoracic process.   08-21-22: ct of head:  1. Possible sulcal effacement and loss of gray-white differentiation in the posterior right frontal lobe may represent subacute infarct versus artifact. Consider further evaluation with MRI. 2. Mild chronic small vessel ischemia.  08-22-22: MRI head:  1. No acute intracranial abnormality. 2. Findings of chronic small vessel ischemia and volume loss.  NO NEW EXAMS   LABS REVIEWED PREVIOUS   08-04-22: wbc 6.9; hgb 10.1; hct 31.8; mcv 97.5 plt 371; glucose 93; bun 15; creat 0.66; k+ 4.0; na++ 135; ca 8.8; gfr >60 mag 1.8; tsh 1.460; free t3: 1.8; free t4: 1.02 08-14-22: wbc 9.3; hgb 10.5; hct 31.8 mcv 94.9 plt 324; glucose 93; bun 15 creat 0.66; k+ 3.6; na++ 138; ca 9.1; gfr >60 08-23-22: wbc 9.1; hgb 10.2; hct 31.8; mcv 94.9 plt 379'; glucose 100; bun 12; creat 0.61; k+ 4.3; na++ 139; ca 9.0; gfr >60 10-11-22: glucose 87; bun 16; creat 0.59; k+ 3.7; na++ 137; ca 9.4; gfr >60 10-17-22: glucose 98; bun 17; creat 0.56; k+ 4.1; na++ 138; ca 9.4; gfr >60 11-14-22: hepatitis C nr 02-28-23: wbc 7.6; hgb 12.1; hct 375; mcv 100.5 plt 216    NO NEW LABS.   Review of Systems  Constitutional:  Negative for malaise/fatigue.   Respiratory:  Negative for cough and shortness of breath.   Cardiovascular:  Negative for chest pain, palpitations and leg swelling.  Gastrointestinal:  Negative for abdominal pain, constipation and heartburn.  Musculoskeletal:  Negative for back pain, joint pain and myalgias.  Skin: Negative.   Neurological:  Negative for dizziness.       Abnormal facial movements  Psychiatric/Behavioral:  The patient is not nervous/anxious.     Physical Exam Constitutional:      General: She is not in acute distress.    Appearance: She is well-developed. She is not diaphoretic.  Neck:     Thyroid: No thyromegaly.  Cardiovascular:     Rate and Rhythm: Normal rate and regular rhythm.     Pulses: Normal pulses.     Heart sounds: Normal heart sounds.  Pulmonary:     Effort: Pulmonary effort is normal. No respiratory distress.     Breath sounds: Normal breath sounds.  Abdominal:     General: Bowel sounds are normal. There is no distension.     Palpations: Abdomen is soft.     Tenderness: There is no abdominal tenderness.  Musculoskeletal:        General: Normal range of motion.     Cervical back: Neck supple.     Right lower leg: No edema.     Left lower leg: No edema.     Comments: Has abnormal facial movements   Lymphadenopathy:     Cervical: No cervical adenopathy.  Skin:    General: Skin is warm and dry.  Neurological:     Mental Status: She is alert and oriented to person, place, and  time.  Psychiatric:        Mood and Affect: Mood normal.       ASSESSMENT/ PLAN:  TODAY  Tardive dyskinesia: is worse will stop ingrezza and will begin austedo xr 12 mg daily will monitor her response   Synthia Innocent NP [facility]  call [phone]

## 2023-03-21 NOTE — Telephone Encounter (Signed)
Message routed to PCP Chilton Si, Chong Sicilian, NP

## 2023-03-21 NOTE — Telephone Encounter (Signed)
Sharee Holster, NP  You14 minutes ago (10:05 AM)    I will be happy to start that process Deb    Reply from provider copied and pasted into chart and also send to patient via [facility]

## 2023-03-24 ENCOUNTER — Non-Acute Institutional Stay (SKILLED_NURSING_FACILITY): Payer: PPO | Admitting: Adult Health

## 2023-03-24 ENCOUNTER — Encounter: Payer: Self-pay | Admitting: Adult Health

## 2023-03-24 ENCOUNTER — Other Ambulatory Visit (HOSPITAL_COMMUNITY)
Admission: RE | Admit: 2023-03-24 | Discharge: 2023-03-24 | Disposition: A | Discharge status: Home / Self Care | Payer: PPO | Source: Skilled Nursing Facility | Attending: Adult Health | Admitting: Adult Health

## 2023-03-24 DIAGNOSIS — E118 Type 2 diabetes mellitus with unspecified complications: Secondary | ICD-10-CM | POA: Diagnosis not present

## 2023-03-24 DIAGNOSIS — E785 Hyperlipidemia, unspecified: Secondary | ICD-10-CM | POA: Insufficient documentation

## 2023-03-24 LAB — LIPID PANEL
Cholesterol: 197 mg/dL (ref 0–200)
HDL: 54 mg/dL (ref >40)
LDL Cholesterol: 125 mg/dL — ABNORMAL HIGH (ref 0–99)
Total CHOL/HDL Ratio: 3.6 RATIO
Triglycerides: 91 mg/dL (ref <150)
VLDL: 18 mg/dL (ref 0–40)

## 2023-03-24 LAB — HEMOGLOBIN A1C
Hgb A1c MFr Bld: 5.4 % (ref 4.8–5.6)
Mean Plasma Glucose: 108.28 mg/dL

## 2023-03-24 LAB — CBC
HCT: 39.5 % (ref 36.0–46.0)
Hemoglobin: 12.6 g/dL (ref 12.0–15.0)
MCH: 32.2 pg (ref 26.0–34.0)
MCHC: 31.9 g/dL (ref 30.0–36.0)
MCV: 101 fL — ABNORMAL HIGH (ref 80.0–100.0)
Platelets: 201 10*3/uL (ref 150–400)
RBC: 3.91 MIL/uL (ref 3.87–5.11)
RDW: 13.2 % (ref 11.5–15.5)
WBC: 8.2 10*3/uL (ref 4.0–10.5)
nRBC: 0 % (ref 0.0–0.2)

## 2023-03-24 NOTE — Progress Notes (Signed)
Location:  [facility] Room Number: 101 Place of Service:  SNF (31)   CODE STATUS: full code   Allergies  Allergen Reactions   Ticlid [Ticlopidine] Shortness Of Breath   Nsaids Other (See Comments)    Bleeding ulcer   Prednisone Other (See Comments)    Makes her stomach hurt--knows this isn't an allergy but doesn't want to take it    Trazodone And Nefazodone     hjallucinations   Monistat [Miconazole] Swelling and Rash   Myrbetriq Theodosia Paling Er] Rash   Sulfa Antibiotics Rash    Chief Complaint  Patient presents with   Acute Visit    Follow up labs     HPI:  Her LDL is elevated at 125. She is on abilify for her mental health. This medication is needed for her mental health. She does follow a regular diet. She is presently not on a statin for her cholesterol   Past Medical History:  Diagnosis Date   Anemia    Arthritis    Asthma    Atrial fibrillation (HCC)    COVID-19 08/05/2022   Depression    H/O head injury    Hearing loss    Heart murmur    can be heard at times, Doctor said not to worry   History of kidney stones    History of stomach ulcers    Migraine    Pneumonia    Psittacosis    Stroke (HCC)    Weakness of neck 03/02/2013    Past Surgical History:  Procedure Laterality Date   ANKLE FRACTURE SURGERY Left    APPENDECTOMY     BREAST LUMPECTOMY     CESAREAN SECTION     x 3   I & D KNEE WITH POLY EXCHANGE Left 08/16/2022   Procedure: KNEE POLY EXCHANGE;  Surgeon: Bjorn Pippin, MD;  Location: [facility] OR;  Service: Orthopedics;  Laterality: Left;   KNEE ARTHROSCOPY WITH PATELLAR TENDON REPAIR Left 08/16/2022   Procedure: IRRIGATION AND DEBRIDEMENT LEFT KNEE REVISION WITH PATELLAR TENDON REPAIR and poly exchange;  Surgeon: Bjorn Pippin, MD;  Location: [facility] OR;  Service: Orthopedics;  Laterality: Left;   PARTIAL HYSTERECTOMY     ROTATOR CUFF REPAIR Right    TOTAL KNEE ARTHROPLASTY Left 07/22/2022   Procedure: TOTAL KNEE  ARTHROPLASTY;  Surgeon: Joen Laura, MD;  Location: [facility] ORS;  Service: Orthopedics;  Laterality: Left;    Social History   Socioeconomic History   Marital status: Married    Spouse name: Not on file   Number of children: 3   Years of education: Not on file   Highest education level: Not on file  Occupational History   Occupation: retired  Tobacco Use   Smoking status: Never   Smokeless tobacco: Never  Vaping Use   Vaping Use: Never used  Substance and Sexual Activity   Alcohol use: Not Currently    Comment: occ beer   Drug use: No   Sexual activity: Yes    Birth control/protection: Surgical    Comment: hyst  Other Topics Concern   Not on file  Social History Narrative   Not on file   Social Determinants of Health   Financial Resource Strain: Medium Risk (01/30/2021)   Overall Financial Resource Strain (CARDIA)    Difficulty of Paying Living Expenses: Somewhat hard  Food Insecurity: No Food Insecurity (08/15/2022)   Hunger Vital Sign    Worried About Running Out of Food in the  Last Year: Never true    Ran Out of Food in the Last Year: Never true  Transportation Needs: No Transportation Needs (08/15/2022)   PRAPARE - Administrator, Civil Service (Medical): No    Lack of Transportation (Non-Medical): No  Physical Activity: Insufficiently Active (01/30/2021)   Exercise Vital Sign    Days of Exercise per Week: 2 days    Minutes of Exercise per Session: 10 min  Stress: No Stress Concern Present (01/30/2021)   [employer] of Occupational Health - Occupational Stress Questionnaire    Feeling of Stress : Only a little  Social Connections: Moderately Integrated (01/30/2021)   Social Connection and Isolation Panel [NHANES]    Frequency of Communication with Friends and Family: Twice a week    Frequency of Social Gatherings with Friends and Family: Twice a week    Attends Religious Services: More than 4 times per year    Active Member of [employer] or  Organizations: No    Attends Banker Meetings: Never    Marital Status: Married  Catering manager Violence: Not At Risk (08/15/2022)   Humiliation, Afraid, Rape, and Kick questionnaire    Fear of Current or Ex-Partner: No    Emotionally Abused: No    Physically Abused: No    Sexually Abused: No   Family History  Problem Relation Age of Onset   Cancer Father    Heart disease Brother    Alcohol abuse Brother    Depression Maternal Grandmother    Depression Grandchild    Breast cancer Daughter       VITAL SIGNS BP 104/60   Pulse 78   Temp 98.4 F (36.9 C)   Ht 5\' 5"  (1.651 m)   Wt 178 lb 3.2 oz (80.8 kg)   BMI 29.65 kg/m   Outpatient Encounter Medications as of 03/24/2023  Medication Sig   acetaminophen (TYLENOL) 650 MG CR tablet Take 650 mg by mouth at bedtime.   apixaban (ELIQUIS) 5 MG TABS tablet Take 1 tablet (5 mg total) by mouth 2 (two) times daily.   ARIPiprazole (ABILIFY) 10 MG tablet Take 10 mg by mouth daily.   busPIRone (BUSPAR) 30 MG tablet Take 1 tablet (30 mg total) by mouth 2 (two) times daily.   donepezil (ARICEPT) 10 MG tablet Take 10 mg by mouth at bedtime.   DULoxetine (CYMBALTA) 60 MG capsule Take 60 mg by mouth daily.   eszopiclone (LUNESTA) 1 MG TABS tablet Take 1 tablet (1 mg total) by mouth at bedtime. Take immediately before bedtime   fexofenadine (ALLEGRA) 180 MG tablet Take 180 mg by mouth daily.   furosemide (LASIX) 20 MG tablet Take 20 mg by mouth.   metoprolol tartrate (LOPRESSOR) 25 MG tablet Take 1 tablet (25 mg total) by mouth 2 (two) times daily.   modafinil (PROVIGIL) 200 MG tablet Take 0.5 tablets (100 mg total) by mouth daily.   Nutritional Supplements (ENSURE ENLIVE PO) Take 1 Can by mouth 2 (two) times daily.   Omeprazole 20 MG TBDD Take 20 mg by mouth daily as needed.   polyethylene glycol (MIRALAX / GLYCOLAX) 17 g packet Take 17 g by mouth daily as needed.   senna (SENOKOT) 8.6 MG TABS tablet Take 1 tablet (8.6 mg total)  by mouth 2 (two) times daily.   SUMAtriptan (IMITREX) 100 MG tablet Take 100 mg by mouth every 2 (two) hours as needed for migraine or headache. May repeat in 2 hours if headache persists or recurs.  UNABLE TO FIND Diet: Regular   valbenazine (INGREZZA) 80 MG capsule Take 1 capsule (80 mg total) by mouth daily.   Vitamins A & D (VITAMIN A & D) ointment Apply 1 Application topically 2 (two) times daily. To left heel   ZINC OXIDE EX Apply 1 Application topically See admin instructions. every shift and prn Special Instructions: Apply to sacrum, bilateral buttocks and coccyx q shift for redness.   No facility-administered encounter medications on file as of 03/24/2023.     SIGNIFICANT DIAGNOSTIC EXAMS  PREVIOUS   08-16-22: chest x-ray: 1. No acute intrathoracic process.   08-21-22: ct of head:  1. Possible sulcal effacement and loss of gray-white differentiation in the posterior right frontal lobe may represent subacute infarct versus artifact. Consider further evaluation with MRI. 2. Mild chronic small vessel ischemia.  08-22-22: MRI head:  1. No acute intracranial abnormality. 2. Findings of chronic small vessel ischemia and volume loss.  NO NEW EXAMS   LABS REVIEWED PREVIOUS   08-04-22: wbc 6.9; hgb 10.1; hct 31.8; mcv 97.5 plt 371; glucose 93; bun 15; creat 0.66; k+ 4.0; na++ 135; ca 8.8; gfr >60 mag 1.8; tsh 1.460; free t3: 1.8; free t4: 1.02 08-14-22: wbc 9.3; hgb 10.5; hct 31.8 mcv 94.9 plt 324; glucose 93; bun 15 creat 0.66; k+ 3.6; na++ 138; ca 9.1; gfr >60 08-23-22: wbc 9.1; hgb 10.2; hct 31.8; mcv 94.9 plt 379'; glucose 100; bun 12; creat 0.61; k+ 4.3; na++ 139; ca 9.0; gfr >60 10-11-22: glucose 87; bun 16; creat 0.59; k+ 3.7; na++ 137; ca 9.4; gfr >60 10-17-22: glucose 98; bun 17; creat 0.56; k+ 4.1; na++ 138; ca 9.4; gfr >60 11-14-22: hepatitis C nr 02-28-23: wbc 7.6; hgb 12.1; hct 375; mcv 100.5 plt 216    TODAY  03-24-23: wbc 8.2; hgb 12.6; hct 39.5; mcv 101.1 plt 201; hgb  A1c 5.4 chol 197; ldl 125; hdl 91; hdl 54   Review of Systems  Constitutional:  Negative for malaise/fatigue.  Respiratory:  Negative for cough and shortness of breath.   Cardiovascular:  Negative for chest pain, palpitations and leg swelling.  Gastrointestinal:  Negative for abdominal pain, constipation and heartburn.  Musculoskeletal:  Negative for back pain, joint pain and myalgias.  Skin: Negative.   Neurological:  Negative for dizziness.  Psychiatric/Behavioral:  The patient is not nervous/anxious.    Physical Exam Constitutional:      General: She is not in acute distress.    Appearance: She is well-developed. She is not diaphoretic.  Neck:     Thyroid: No thyromegaly.  Cardiovascular:     Rate and Rhythm: Normal rate and regular rhythm.     Pulses: Normal pulses.     Heart sounds: Normal heart sounds.  Pulmonary:     Effort: Pulmonary effort is normal. No respiratory distress.     Breath sounds: Normal breath sounds.  Abdominal:     General: Bowel sounds are normal. There is no distension.     Palpations: Abdomen is soft.     Tenderness: There is no abdominal tenderness.  Musculoskeletal:        General: Normal range of motion.     Cervical back: Neck supple.     Right lower leg: No edema.     Left lower leg: No edema.     Comments: Has abnormal facial movements    Lymphadenopathy:     Cervical: No cervical adenopathy.  Skin:    General: Skin is warm and dry.  Neurological:  Mental Status: She is alert. Mental status is at baseline.     Comments: SLUMS 18/30   Psychiatric:        Mood and Affect: Mood normal.      ASSESSMENT/ PLAN:   TODAY  Hyperlipidemia LDL goal <100: ldl 125 will begin crestor 5 mg daily   Synthia Innocent NP [facility]   call [phone]

## 2023-03-25 ENCOUNTER — Ambulatory Visit: Payer: PPO | Admitting: Psychiatry

## 2023-03-26 ENCOUNTER — Ambulatory Visit: Payer: PPO | Admitting: Psychiatry

## 2023-03-27 ENCOUNTER — Ambulatory Visit: Payer: PPO | Admitting: Psychiatry

## 2023-03-27 DIAGNOSIS — F3341 Major depressive disorder, recurrent, in partial remission: Secondary | ICD-10-CM | POA: Diagnosis not present

## 2023-03-27 DIAGNOSIS — G2401 Drug induced subacute dyskinesia: Secondary | ICD-10-CM

## 2023-03-27 DIAGNOSIS — F411 Generalized anxiety disorder: Secondary | ICD-10-CM | POA: Diagnosis not present

## 2023-03-27 DIAGNOSIS — F5101 Primary insomnia: Secondary | ICD-10-CM | POA: Diagnosis not present

## 2023-03-27 NOTE — Progress Notes (Signed)
[name] [id number] [birth year] 77 y.o.  Subjective:   Patient ID:  [name] is a 77 y.o. (DOB 10/17/1946) female.  Chief Complaint:  Chief Complaint  Patient presents with   Medication Problem    Tardive Dyskinesia   Follow-up    Anxiety, depression, insomnia    HPI [name] presents to the office today for follow-up of depression, anxiety, and TD. [name] is accompanied by her husband.   [name] reports, "I'm feeling a lot better" after med changes. [name] reports that her mood has been "pretty good" and depression has been "no more than usual." [name] reports that [name] has had some anxiety and worry. [name] reports some worry about her foot. [name] reports that [name] had a severe panic attack about a month ago and is unsure what triggered this. [name] reports adequate sleep. [name] reports that [name] dreams frequently "but it doesn't bother me." [name] reports that her energy "could be better." [name] repots that her motivation is "pretty good." Appetite has been ok. [name] reports that her concentration has been "pretty good" and is reading a book with short chapters. [name] reports that [name] has been enjoying her book. [name] has been enjoying watching game shows on TV. Denies SI.   Husband reports, "[name] has a good memory now." Pt also reports improved memory.   [name] reports that her tongue movements, licking of her lips, and shaking have been worse. [name] reports that Ingrezza was recently changed to austedo XR. [name] reports that Austedo XR was ordered 03/21/23 and was not available until last night.   Denies AH.   [name] is at [facility].   [name] reports that 2 of her children came to visit on Mother's Day and they went out to lunch.   Past medication trials: Celexa Pristiq Wellbutrin-hallucinations Effexor Prozac Zoloft Lexapro Nefazodone-effective and well-tolerated Cymbalta Abilify Latuda-Adverse reaction. Worsening depression Lamotrigine-Worsening mood and  anxiety Nuedexta Trazodone Gabapentin- Disrupted sleep schedules, nightmares.  Vistaril Nuvigil Provigil Memantine Lunesta- Effective for insomnia    AIMS    Flowsheet Row Office Visit from 03/27/2023 in [facility] Office Visit from 02/11/2023 in [facility] from 06/18/2022 in [facility] Visit from 05/02/2022 in [facility] Visit from 07/24/2021 in [facility]  AIMS Total Score 17 10 6 11  0      GAD-7    Flowsheet Row Office Visit from 01/30/2021 in [facility]  Total GAD-7 Score 1      Mini-Mental    Flowsheet Row Office Visit from 04/03/2022 in [facility]  Total Score (max 30 points ) 30      PHQ2-9    Flowsheet Row Nursing Home from 02/06/2023 in [facility] from 12/12/2022 in [facility] Home from 11/06/2022 in [facility] Office Visit from 01/30/2021 in [facility] at [facility] Office Visit from 05/11/2019 in [facility]  PHQ-2 Total Score 0 0 1 2 1   PHQ-9 Total Score 1 0 3 6 --      Flowsheet Row Pre-Admission Testing 61 from 10/11/2022 in [facility] Ad[facility] (Discharged) from 08/14/2022 in [facility] Ad[facility] (Discharged)  from 07/22/2022 in [facility]  C-SSRS RISK CATEGORY No Risk No Risk No Risk        Review of Systems:  Review of Systems  Musculoskeletal:  Positive for gait problem.  Neurological:  Negative for headaches.  Psychiatric/Behavioral:         Please refer to HPI    Medications: I have reviewed the patient's current  medications.  Current Outpatient Medications  Medication Sig Dispense Refill   apixaban (ELIQUIS) 5 MG TABS tablet Take 1 tablet (5 mg total) by mouth 2 (two) times daily. 60 tablet    ARIPiprazole (ABILIFY) 10 MG tablet Take 10 mg by mouth daily.     busPIRone (BUSPAR) 30 MG tablet Take 1 tablet (30 mg total) by mouth 2 (two) times daily. 180 tablet 0   Deutetrabenazine ER (AUSTEDO XR) 12 MG TB24 Take 12 mg by mouth.     donepezil (ARICEPT) 10 MG tablet Take 10 mg by mouth at bedtime.     DULoxetine (CYMBALTA) 60 MG capsule Take 60 mg by mouth daily.     eszopiclone (LUNESTA) 1 MG TABS tablet Take 1 tablet (1 mg total) by mouth at bedtime. Take immediately before bedtime 30 tablet 0   fexofenadine (ALLEGRA) 180 MG tablet Take 180 mg by mouth daily.     furosemide (LASIX) 20 MG tablet Take 20 mg by mouth.     metoprolol tartrate (LOPRESSOR) 25 MG tablet Take 1 tablet (25 mg total) by mouth 2 (two) times daily. 180 tablet 3   modafinil (PROVIGIL) 200 MG tablet Take 0.5 tablets (100 mg total) by mouth daily. 15 tablet 0   Omeprazole 20 MG TBDD Take 20 mg by mouth daily as needed. 30 tablet 3   polyethylene glycol (MIRALAX / GLYCOLAX) 17 g packet Take 17 g by mouth daily as needed. 14 each 0   rosuvastatin (CRESTOR) 5 MG tablet Take 5 mg by mouth at bedtime.     senna (SENOKOT) 8.6 MG TABS tablet Take 1 tablet (8.6 mg total) by mouth 2 (two) times daily. 120 tablet 0   acetaminophen (TYLENOL) 650 MG CR tablet Take 650 mg by mouth at bedtime.     Nutritional Supplements (ENSURE ENLIVE PO) Take 1 Can by mouth 2 (two) times daily.     SUMAtriptan (IMITREX) 100 MG tablet Take 100 mg by mouth every 2 (two) hours as needed for migraine or headache. May repeat in 2 hours if headache persists or recurs.     UNABLE TO FIND Diet: Regular     Vitamins A & D (VITAMIN A & D) ointment Apply 1 Application topically 2 (two) times daily. To left heel     ZINC OXIDE EX Apply 1 Application topically See admin  instructions. every shift and prn Special Instructions: Apply to sacrum, bilateral buttocks and coccyx q shift for redness.     No current facility-administered medications for this visit.    Medication Side Effects: Other: Involuntary movements.   Allergies:  Allergies  Allergen Reactions   Ticlid [Ticlopidine] Shortness Of Breath   Nsaids Other (See Comments)    Bleeding ulcer   Prednisone Other (See Comments)    Makes her stomach hurt--knows this isn't an allergy but doesn't want to take it    Trazodone And Nefazodone     hjallucinations   Monistat [Miconazole] Swelling and Rash   Myrbetriq Theodosia Paling Er] Rash   Sulfa Antibiotics Rash    Past Medical History:  Diagnosis Date  Anemia    Arthritis    Asthma    Atrial fibrillation (HCC)    COVID-19 08/05/2022   Depression    H/O head injury    Hearing loss    Heart murmur    can be heard at times, Doctor said not to worry   History of kidney stones    History of stomach ulcers    Migraine    Pneumonia    Psittacosis    Stroke (HCC)    Weakness of neck 03/02/2013    Past Medical History, Surgical history, Social history, and Family history were reviewed and updated as appropriate.   Please see review of systems for further details on the patient's review from today.   Objective:   Physical Exam:  There were no vitals taken for this visit.  Physical Exam Constitutional:      General: [name] is not in acute distress. Musculoskeletal:        General: No deformity.  Neurological:     Mental Status: [name] is alert and oriented to person, place, and time.     Coordination: Coordination normal.  Psychiatric:        Attention and Perception: Attention and perception normal. [name] does not perceive auditory or visual hallucinations.        Mood and Affect: Mood normal. Mood is not anxious or depressed. Affect is not labile, blunt, angry or inappropriate.        Speech: Speech normal.        Behavior: Behavior  normal.        Thought Content: Thought content normal. Thought content is not paranoid or delusional. Thought content does not include homicidal or suicidal ideation. Thought content does not include homicidal or suicidal plan.        Cognition and Memory: Cognition normal.        Judgment: Judgment normal.     Comments: Insight intact Improved memory compared to recent exams. Pt able to recall multiple details related to her health (med changes, reasons for changes, various providers' recommendations, etc). [name] also recalls other details, such as Mother's day celebration, activities [name] has attended, etc.      Lab Review:     Component Value Date/Time   NA 138 10/17/2022 0800   NA 140 05/27/2017 1553   K 4.1 10/17/2022 0800   CL 99 10/17/2022 0800   CO2 30 10/17/2022 0800   GLUCOSE 98 10/17/2022 0800   BUN 17 10/17/2022 0800   BUN 15 05/27/2017 1553   CREATININE 0.56 10/17/2022 0800   CALCIUM 9.4 10/17/2022 0800   PROT 7.0 07/18/2022 1459   PROT 7.4 05/27/2017 1553   ALBUMIN 4.1 07/18/2022 1459   ALBUMIN 4.5 05/27/2017 1553   AST 24 07/18/2022 1459   ALT 15 07/18/2022 1459   ALKPHOS 50 07/18/2022 1459   BILITOT 0.5 07/18/2022 1459   BILITOT 0.4 05/27/2017 1553   GFRNONAA >60 10/17/2022 0800   GFRAA 104 05/27/2017 1553       Component Value Date/Time   WBC 8.2 03/24/2023 0555   RBC 3.91 03/24/2023 0555   HGB 12.6 03/24/2023 0555   HCT 39.5 03/24/2023 0555   PLT 201 03/24/2023 0555   MCV 101.0 (H) 03/24/2023 0555   MCH 32.2 03/24/2023 0555   MCHC 31.9 03/24/2023 0555   RDW 13.2 03/24/2023 0555   LYMPHSABS 1.0 02/28/2023 0800   MONOABS 0.3 02/28/2023 0800   EOSABS 0.0 02/28/2023 0800   BASOSABS 0.0 02/28/2023 0800  No results found for: "POCLITH", "LITHIUM"   No results found for: "PHENYTOIN", "PHENOBARB", "VALPROATE", "CBMZ"   .res Assessment: Plan:    Pt and husband report that her mood, anxiety, and insomnia have significantly improved in the last month.  [name] reports that that [name] has been experiencing worsening involuntary movements, to include orofacial movements of tongue, lips, and jaw, and tremor. [name] reports repeated "lip-licking" which [name] has experienced at times in the past. [name] reports that Ingrezza no longer seemed to be effective and that [name] and daughter requested a medication change for TD. [name] reports that Austedo XR was recently ordered and [name] received the first dose last night. Denies any side effects after first dose. Recommend continuing to titrate Austedo weekly to maintenance dose. Recommend Austedo XR 12 mg daily for one week, then 18 mg daily for one week, then 24 mg daily for one week, then 30 mg daily for tardive dyskinesia.  Continue Abilify 10 mg daily for depression due to at least 2 failed gradual dose reduction attempts in the past with worsening depression. Continue Cymbalta 60 mg po qd for depression and anxiety.  Continue BuSpar 30 mg twice daily for anxiety. Continue Aricept 10 mg daily for cognition.  Continue Lunesta 1 mg po QHS for insomnia.  Continue Modafinil 100 mg po qd for excessive somnolence, fatigue, low energy, and low motivation. Theses symptoms have worsened in the past when not taking Modafinil.  Continue Aricept 10 mg po qd for cognition.  Pt to follow-up with this provider in 4-6 weeks or sooner if clinically indicated.   I spent 45 minutes dedicated to the care of this patient on the date of this  encounter to include pre-visit review of records, face-to-face time with the patient discussing treatment of tardive dyskinesia, ordering of medication, and post visit documentation.   [name] was seen today for medication problem and follow-up.  Diagnoses and all orders for this visit:  Generalized anxiety disorder  Tardive dyskinesia  Recurrent major depressive disorder, in partial remission (HCC)  Primary insomnia     Please see After Visit Summary for patient specific instructions.  Future  Appointments  Date Time Provider Department Center  04/04/2023  1:15 PM [facility] 1 [facility]  04/15/2023 11:30 AM Adline Potter, NP [facility]  05/08/2023  2:30 PM Corie Chiquito, PMHNP [facility] None    No orders of the defined types were placed in this encounter.   -------------------------------

## 2023-03-28 ENCOUNTER — Encounter: Payer: Self-pay | Admitting: Psychiatry

## 2023-03-31 DIAGNOSIS — M79671 Pain in right foot: Secondary | ICD-10-CM | POA: Diagnosis not present

## 2023-03-31 DIAGNOSIS — M25571 Pain in right ankle and joints of right foot: Secondary | ICD-10-CM | POA: Diagnosis not present

## 2023-04-01 DIAGNOSIS — Z1212 Encounter for screening for malignant neoplasm of rectum: Secondary | ICD-10-CM | POA: Diagnosis not present

## 2023-04-01 DIAGNOSIS — Z1211 Encounter for screening for malignant neoplasm of colon: Secondary | ICD-10-CM | POA: Diagnosis not present

## 2023-04-04 ENCOUNTER — Ambulatory Visit (HOSPITAL_COMMUNITY): Payer: PPO

## 2023-04-04 ENCOUNTER — Encounter (HOSPITAL_COMMUNITY): Payer: Self-pay

## 2023-04-14 ENCOUNTER — Other Ambulatory Visit: Payer: Self-pay | Admitting: Adult Health

## 2023-04-14 MED ORDER — MODAFINIL 200 MG PO TABS: 100.0000 mg | ORAL_TABLET | Freq: Every day | ORAL | 0 refills | Status: DC

## 2023-04-14 MED ORDER — ESZOPICLONE 1 MG PO TABS: 1.0000 mg | ORAL_TABLET | Freq: Every day | ORAL | 0 refills | Status: DC

## 2023-04-15 ENCOUNTER — Ambulatory Visit: Payer: PPO | Admitting: Adult Health

## 2023-04-15 DIAGNOSIS — F411 Generalized anxiety disorder: Secondary | ICD-10-CM | POA: Diagnosis not present

## 2023-04-15 DIAGNOSIS — F332 Major depressive disorder, recurrent severe without psychotic features: Secondary | ICD-10-CM | POA: Diagnosis not present

## 2023-04-15 DIAGNOSIS — G2401 Drug induced subacute dyskinesia: Secondary | ICD-10-CM | POA: Diagnosis not present

## 2023-04-15 DIAGNOSIS — F5105 Insomnia due to other mental disorder: Secondary | ICD-10-CM | POA: Diagnosis not present

## 2023-04-16 ENCOUNTER — Non-Acute Institutional Stay (SKILLED_NURSING_FACILITY): Payer: PPO | Admitting: Adult Health

## 2023-04-16 ENCOUNTER — Encounter: Payer: Self-pay | Admitting: Adult Health

## 2023-04-16 DIAGNOSIS — E46 Unspecified protein-calorie malnutrition: Secondary | ICD-10-CM | POA: Diagnosis not present

## 2023-04-16 DIAGNOSIS — R32 Unspecified urinary incontinence: Secondary | ICD-10-CM | POA: Insufficient documentation

## 2023-04-16 DIAGNOSIS — R400 Somnolence: Secondary | ICD-10-CM

## 2023-04-16 DIAGNOSIS — N3941 Urge incontinence: Secondary | ICD-10-CM | POA: Diagnosis not present

## 2023-04-16 NOTE — Progress Notes (Signed)
Location:  [facility] Room Number: 59 W Place of Service:  SNF (31)   CODE STATUS: Full Code  Allergies  Allergen Reactions   Ticlid [Ticlopidine] Shortness Of Breath   Nsaids Other (See Comments)    Bleeding ulcer   Prednisone Other (See Comments)    Makes her stomach hurt--knows this isn't an allergy but doesn't want to take it    Trazodone And Nefazodone     hjallucinations   Monistat [Miconazole] Swelling and Rash   Myrbetriq [name]] Rash   Sulfa Antibiotics Rash    Chief Complaint  Patient presents with   Medical Management of Chronic Issues            Somnolence: Protein calorie malnutrition:  Urinary incontinence:     HPI:  She is a 77 year old long term resident of this facility being seen for the management of her chronic illnesses: Somnolence: Protein calorie malnutrition:  Urinary incontinence. She continues to have TD movements of her face is on austedo xr daily. She is now on the maximum dose starting today. She denies any uncontrolled pain. There are no reports of anxiety or excessive lethargy  Past Medical History:  Diagnosis Date   Anemia    Arthritis    Asthma    Atrial fibrillation (HCC)    COVID-19 08/05/2022   Depression    H/O head injury    Hearing loss    Heart murmur    can be heard at times, Doctor said not to worry   History of kidney stones    History of stomach ulcers    Migraine    Pneumonia    Psittacosis    Stroke (HCC)    Weakness of neck 03/02/2013    Past Surgical History:  Procedure Laterality Date   ANKLE FRACTURE SURGERY Left    APPENDECTOMY     BREAST LUMPECTOMY     CESAREAN SECTION     x 3   I & D KNEE WITH POLY EXCHANGE Left 08/16/2022   Procedure: KNEE POLY EXCHANGE;  Surgeon: Bjorn Pippin, MD;  Location: [facility] OR;  Service: Orthopedics;  Laterality: Left;   KNEE ARTHROSCOPY WITH PATELLAR TENDON REPAIR Left 08/16/2022   Procedure: IRRIGATION AND DEBRIDEMENT LEFT KNEE REVISION WITH  PATELLAR TENDON REPAIR and poly exchange;  Surgeon: Bjorn Pippin, MD;  Location: [facility] OR;  Service: Orthopedics;  Laterality: Left;   PARTIAL HYSTERECTOMY     ROTATOR CUFF REPAIR Right    TOTAL KNEE ARTHROPLASTY Left 07/22/2022   Procedure: TOTAL KNEE ARTHROPLASTY;  Surgeon: Joen Laura, MD;  Location: [facility] ORS;  Service: Orthopedics;  Laterality: Left;    Social History   Socioeconomic History   Marital status: Married    Spouse name: Not on file   Number of children: 3   Years of education: Not on file   Highest education level: Not on file  Occupational History   Occupation: retired  Tobacco Use   Smoking status: Never   Smokeless tobacco: Never  Vaping Use   Vaping Use: Never used  Substance and Sexual Activity   Alcohol use: Not Currently    Comment: occ beer   Drug use: No   Sexual activity: Yes    Birth control/protection: Surgical    Comment: hyst  Other Topics Concern   Not on file  Social History Narrative   Not on file   Social Determinants of Health   Financial Resource Strain: Medium Risk (01/30/2021)  Overall Financial Resource Strain (CARDIA)    Difficulty of Paying Living Expenses: Somewhat hard  Food Insecurity: No Food Insecurity (08/15/2022)   Hunger Vital Sign    Worried About Running Out of Food in the Last Year: Never true    Ran Out of Food in the Last Year: Never true  Transportation Needs: No Transportation Needs (08/15/2022)   PRAPARE - Administrator, Civil Service (Medical): No    Lack of Transportation (Non-Medical): No  Physical Activity: Insufficiently Active (01/30/2021)   Exercise Vital Sign    Days of Exercise per Week: 2 days    Minutes of Exercise per Session: 10 min  Stress: No Stress Concern Present (01/30/2021)   [employer] of Occupational Health - Occupational Stress Questionnaire    Feeling of Stress : Only a little  Social Connections: Moderately Integrated (01/30/2021)   Social Connection and Isolation  Panel [NHANES]    Frequency of Communication with Friends and Family: Twice a week    Frequency of Social Gatherings with Friends and Family: Twice a week    Attends Religious Services: More than 4 times per year    Active Member of [employer] or Organizations: No    Attends Banker Meetings: Never    Marital Status: Married  Catering manager Violence: Not At Risk (08/15/2022)   Humiliation, Afraid, Rape, and Kick questionnaire    Fear of Current or Ex-Partner: No    Emotionally Abused: No    Physically Abused: No    Sexually Abused: No   Family History  Problem Relation Age of Onset   Cancer Father    Heart disease Brother    Alcohol abuse Brother    Depression Maternal Grandmother    Depression Grandchild    Breast cancer Daughter       VITAL SIGNS BP 124/74   Pulse 76   Temp 98.8 F (37.1 C)   Resp 18   Ht 5\' 5"  (1.651 m)   Wt 178 lb 12.8 oz (81.1 kg)   SpO2 98%   BMI 29.75 kg/m   Outpatient Encounter Medications as of 04/16/2023  Medication Sig   acetaminophen (TYLENOL) 650 MG CR tablet Take 650 mg by mouth at bedtime.   apixaban (ELIQUIS) 5 MG TABS tablet Take 1 tablet (5 mg total) by mouth 2 (two) times daily.   ARIPiprazole (ABILIFY) 10 MG tablet Take 10 mg by mouth daily.   busPIRone (BUSPAR) 30 MG tablet Take 1 tablet (30 mg total) by mouth 2 (two) times daily.   Deutetrabenazine (AUSTEDO) 6 MG TABS Take 6 mg by mouth daily at 2 PM. For total dose of 30 mg daily   Deutetrabenazine Smith (AUSTEDO XR) 24 MG TB24 Take 25 mg by mouth daily at 2 PM.   donepezil (ARICEPT) 10 MG tablet Take 10 mg by mouth at bedtime.   DULoxetine (CYMBALTA) 60 MG capsule Take 60 mg by mouth daily.   eszopiclone (LUNESTA) 1 MG TABS tablet Take 1 tablet (1 mg total) by mouth at bedtime. Take immediately before bedtime   fexofenadine (ALLEGRA) 180 MG tablet Take 180 mg by mouth daily.   furosemide (LASIX) 20 MG tablet Take 20 mg by mouth.   Magnesium Hydroxide (MILK OF MAGNESIA PO)  Take 30 mLs by mouth as needed (constipation).   metoprolol tartrate (LOPRESSOR) 25 MG tablet Take 1 tablet (25 mg total) by mouth 2 (two) times daily.   modafinil (PROVIGIL) 200 MG tablet Take 0.5 tablets (100 mg total)  by mouth daily.   Omeprazole 20 MG TBDD Take 20 mg by mouth daily as needed.   polyethylene glycol (MIRALAX / GLYCOLAX) 17 g packet Take 17 g by mouth daily as needed.   rosuvastatin (CRESTOR) 5 MG tablet Take 5 mg by mouth at bedtime.   senna (SENOKOT) 8.6 MG TABS tablet Take 1 tablet (8.6 mg total) by mouth 2 (two) times daily.   SUMAtriptan (IMITREX) 100 MG tablet Take 100 mg by mouth every 2 (two) hours as needed for migraine or headache. May repeat in 2 hours if headache persists or recurs.   UNABLE TO FIND Diet: Regular   Vitamins A & D (VITAMIN A & D) ointment Apply 1 Application topically 2 (two) times daily. To left heel   ZINC OXIDE EX Apply 1 Application topically See admin instructions. every shift and prn Special Instructions: Apply to sacrum, bilateral buttocks and coccyx q shift for redness.   [DISCONTINUED] Deutetrabenazine Smith (AUSTEDO XR) 12 MG TB24 Take 12 mg by mouth.   [DISCONTINUED] Nutritional Supplements (ENSURE ENLIVE PO) Take 1 Can by mouth 2 (two) times daily.   No facility-administered encounter medications on file as of 04/16/2023.     SIGNIFICANT DIAGNOSTIC EXAMS  PREVIOUS   08-16-22: chest x-ray: 1. No acute intrathoracic process.   08-21-22: ct of head:  1. Possible sulcal effacement and loss of gray-white differentiation in the posterior right frontal lobe may represent subacute infarct versus artifact. Consider further evaluation with MRI. 2. Mild chronic small vessel ischemia.  08-22-22: MRI head:  1. No acute intracranial abnormality. 2. Findings of chronic small vessel ischemia and volume loss.  NO NEW EXAMS   LABS REVIEWED PREVIOUS   08-04-22: wbc 6.9; hgb 10.1; hct 31.8; mcv 97.5 plt 371; glucose 93; bun 15; creat 0.66; k+ 4.0;  na++ 135; ca 8.8; gfr >60 mag 1.8; tsh 1.460; free t3: 1.8; free t4: 1.02 08-14-22: wbc 9.3; hgb 10.5; hct 31.8 mcv 94.9 plt 324; glucose 93; bun 15 creat 0.66; k+ 3.6; na++ 138; ca 9.1; gfr >60 08-23-22: wbc 9.1; hgb 10.2; hct 31.8; mcv 94.9 plt 379'; glucose 100; bun 12; creat 0.61; k+ 4.3; na++ 139; ca 9.0; gfr >60 10-11-22: glucose 87; bun 16; creat 0.59; k+ 3.7; na++ 137; ca 9.4; gfr >60 10-17-22: glucose 98; bun 17; creat 0.56; k+ 4.1; na++ 138; ca 9.4; gfr >60 11-14-22: hepatitis C nr 02-28-23: wbc 7.6; hgb 12.1; hct 375; mcv 100.5 plt 216   03-24-23: wbc 8.2; hgb 12.6; hct 39.5; mcv 101.1 plt 201; hgb A1c 5.4 chol 197; ldl 125; hdl 91; hdl 54   NO NEW LABS.   Review of Systems  Constitutional:  Negative for malaise/fatigue.  Respiratory:  Negative for cough and shortness of breath.   Cardiovascular:  Negative for chest pain, palpitations and leg swelling.  Gastrointestinal:  Negative for abdominal pain, constipation and heartburn.  Musculoskeletal:  Negative for back pain, joint pain and myalgias.  Skin: Negative.   Neurological:  Negative for dizziness.       Has TD of face   Psychiatric/Behavioral:  The patient is not nervous/anxious.     Physical Exam Constitutional:      General: She is not in acute distress.    Appearance: She is well-developed. She is not diaphoretic.  Neck:     Thyroid: No thyromegaly.  Cardiovascular:     Rate and Rhythm: Normal rate and regular rhythm.     Pulses: Normal pulses.     Heart sounds:  Normal heart sounds.  Pulmonary:     Effort: Pulmonary effort is normal. No respiratory distress.     Breath sounds: Normal breath sounds.  Abdominal:     General: Bowel sounds are normal. There is no distension.     Palpations: Abdomen is soft.     Tenderness: There is no abdominal tenderness.  Musculoskeletal:        General: Normal range of motion.     Cervical back: Neck supple.     Right lower leg: No edema.     Left lower leg: No edema.      Comments: Has abnormal facial movements     Lymphadenopathy:     Cervical: No cervical adenopathy.  Skin:    General: Skin is warm and dry.  Neurological:     Mental Status: She is alert. Mental status is at baseline.     Comments:  SLUMS 18/30    Psychiatric:        Mood and Affect: Mood normal.      ASSESSMENT/ PLAN:  TODAY  Somnolence: will continue provigil 100 mg daily   2. Protein calorie malnutrition: will continue supplements as directed  3. Urinary incontinence: is off myrbetriq due to rash.   PREVIOUS   4. Longstanding persistent atrial fibrillation/history of cva/history of dvt: heart rate is stable will continue eliquis 5 mg twice daily take lopressor 25 mg twice daily for rate control   5.  Migraine without status migrainosus; non-intractable unspecified type: will continue imitrex 100 mg twice daily as needed   6. Uncomplicated asthma unspecified asthma severity unspecified; unspecified whether persistent: is stable  7. Chronic constipation: will continue senna twice daily and has miralax daily as needed  8. Vascular dementia without behavioral disturbance/neurocognitive deficit: weight is 178 pounds; will continue aricept 10 mg nightly   9. Tardive dyskinesia: does continue to abnormal facial movements; failed ingrezza therapy. Will continue auestuda xr 30 mg daily if this is unsuccessful will need to consider tetrabenazine.   10. Chronic pain syndrome/periprosthetic fracture around internal prosthetic left knee joint sequela: is taking cymbalta 60 mg daily   11. Bilateral lower extremity edema: will continue lasix 20 mg daily   12. Chronic anemia: hgb 12.1  13. Chronic insomnia: will continue lunesta 1 mg nightly   14. Psychosis in elderly/major depression with psychotic features: will continue abilify 10 mg daily cymbalta 60 mg daily buspar 30 mg twice daily     Synthia Innocent NP [facility]  call [phone]

## 2023-05-08 ENCOUNTER — Ambulatory Visit: Payer: PPO | Admitting: Psychiatry

## 2023-05-13 ENCOUNTER — Other Ambulatory Visit: Payer: Self-pay | Admitting: Adult Health

## 2023-05-13 MED ORDER — MODAFINIL 200 MG PO TABS: 100.0000 mg | ORAL_TABLET | Freq: Every day | ORAL | 0 refills | Status: DC

## 2023-05-13 MED ORDER — ESZOPICLONE 1 MG PO TABS: 1.0000 mg | ORAL_TABLET | Freq: Every day | ORAL | 0 refills | Status: DC

## 2023-05-21 ENCOUNTER — Non-Acute Institutional Stay (SKILLED_NURSING_FACILITY): Payer: PPO | Admitting: Internal Medicine

## 2023-05-21 ENCOUNTER — Encounter: Payer: Self-pay | Admitting: Internal Medicine

## 2023-05-21 DIAGNOSIS — L2989 Other pruritus: Secondary | ICD-10-CM

## 2023-05-21 DIAGNOSIS — E785 Hyperlipidemia, unspecified: Secondary | ICD-10-CM | POA: Diagnosis not present

## 2023-05-21 DIAGNOSIS — Z8673 Personal history of transient ischemic attack (TIA), and cerebral infarction without residual deficits: Secondary | ICD-10-CM

## 2023-05-21 DIAGNOSIS — J3089 Other allergic rhinitis: Secondary | ICD-10-CM

## 2023-05-21 DIAGNOSIS — L298 Other pruritus: Secondary | ICD-10-CM | POA: Diagnosis not present

## 2023-05-21 NOTE — Assessment & Plan Note (Addendum)
On exam right upper and right lower extremities are stronger to opposition than the left upper and left lower extremities.  She continues to complain of balance dysfunction.  PT/OT has ordered a leg brace to assist ambulation.

## 2023-05-21 NOTE — Progress Notes (Signed)
NURSING HOME LOCATION:  [facility] ROOM NUMBER:  [id number]  CODE STATUS:  Full Code  PCP:  Synthia Innocent NP  This is a nursing facility follow up visit of chronic medical diagnoses & to document compliance with Regulation 483.30 (c) in The Long Term Care Survey Manual Phase 2 which mandates caregiver visit ( visits can alternate among physician, PA or NP as per statutes) within 10 days of 30 days / 60 days/ 90 days post admission to SNF date    Interim medical record and care since last SNF visit was updated with review of diagnostic studies and change in clinical status since last visit were documented.  HPI: She is a permanent resident this facility with medical diagnoses of history of A-fib, history of stroke, history of migraine headaches, history of asthma, GERD, history of chronic anemia, chronic pain syndrome, history of DVT, and history of major depression with psychotic features. Most recent labs were performed 03/24/2023 and revealed an LDL of 125.  Macrocytosis was present with an MCV of 101.0 but CBC revealed no anemia.  A1c was 5.4%, normal. Renal function is not current; in October of last year she did have AKI with GFR of 42.  The most recent GFR was greater than 60 on 10/17/2022.  Review of systems: She states that she is "not doing well."  This mainly relates to her imbalance and inability to ambulate.  She states that she has been evaluated for a leg brace but this is still pending.  She does describe rhinitis with clear drainage.  When supine she can have a nonproductive cough. She states that she did not follow-up with her Psychiatrist because of questions about insurance coverage.  She is seeking Medicaid coverage.  Constitutional: No fever, significant weight change Eyes: No redness, discharge, pain, vision change ENT/mouth: No purulent discharge, earache, change in hearing, sore throat  Cardiovascular: No chest pain, palpitations, paroxysmal nocturnal  dyspnea, claudication, edema  Respiratory: No sputum production, hemoptysis, DOE, significant snoring, apnea   Gastrointestinal: No heartburn, dysphagia, abdominal pain, nausea /vomiting, rectal bleeding, melena, change in bowels Genitourinary: No dysuria, hematuria, pyuria, incontinence, nocturia Dermatologic: No rash, pruritus, change in appearance of skin Neurologic: No dizziness, headache, syncope, seizures, numbness, tingling Psychiatric: No insomnia, anorexia Endocrine: No change in hair/skin/nails, excessive thirst, excessive hunger, excessive urination  Hematologic/lymphatic: No significant bruising, lymphadenopathy, abnormal bleeding Allergy/immunology: No itchy/watery eyes, significant sneezing, urticaria, angioedema  Physical exam:  Pertinent or positive findings: She exhibits masked facies with lipsmacking.  There is a side-to-side fine head tremor.  Eyebrows are decreased laterally.  There is splitting of the second heart sound.  Abdomen is protuberant.  Pedal pulses are decreased, especially posterior tibial pulses.  She has trace edema at the sock line.  She has interosseous wasting of the hands.  The right upper and right lower extremity are clinically stronger than the left upper and left lower extremities.  She has mixed PIP and DIP arthritic changes of the hands.  General appearance: Adequately nourished; no acute distress, increased work of breathing is present.   Lymphatic: No lymphadenopathy about the head, neck, axilla. Eyes: No conjunctival inflammation or lid edema is present. There is no scleral icterus. Ears:  External ear exam shows no significant lesions or deformities.   Nose:  External nasal examination shows no deformity or inflammation. Nasal mucosa are pink and moist without lesions, exudates Oral exam:  Lips and gums are healthy appearing. There is no oropharyngeal erythema or exudate.  Neck:  No thyromegaly, masses, tenderness noted.    Heart:  Normal rate and  regular rhythm. S1 normal without gallop, murmur, click, rub .  Lungs: Chest clear to auscultation without wheezes, rhonchi, rales, rubs. Abdomen: Bowel sounds are normal. Abdomen is soft and nontender with no organomegaly, hernias, masses. GU: Deferred  Extremities:  No cyanosis, clubbing Neurologic exam :Balance, Rhomberg, finger to nose testing could not be completed due to clinical state Skin: Warm & dry w/o tenting. No significant lesions or rash.  See summary under each active problem in the Problem List with associated updated therapeutic plan

## 2023-05-21 NOTE — Assessment & Plan Note (Addendum)
Myrbetriq was discontinued and the rash has not recurred.  This is listed on her allergy list. She does describe rhinitis without other extrinsic symptoms.  Intranasal steroids trial will be considered.

## 2023-05-21 NOTE — Assessment & Plan Note (Addendum)
She describes clear rhinitis without other extrinsic symptoms.  Trial of intranasal steroids.

## 2023-05-21 NOTE — Patient Instructions (Signed)
See assessment and plan under each diagnosis in the problem list and acutely for this visit 

## 2023-05-23 ENCOUNTER — Encounter: Payer: Self-pay | Admitting: Adult Health

## 2023-05-23 ENCOUNTER — Non-Acute Institutional Stay (SKILLED_NURSING_FACILITY): Payer: PPO | Admitting: Adult Health

## 2023-05-23 DIAGNOSIS — F323 Major depressive disorder, single episode, severe with psychotic features: Secondary | ICD-10-CM

## 2023-05-23 DIAGNOSIS — R4189 Other symptoms and signs involving cognitive functions and awareness: Secondary | ICD-10-CM

## 2023-05-23 DIAGNOSIS — F0150 Vascular dementia without behavioral disturbance: Secondary | ICD-10-CM

## 2023-05-23 DIAGNOSIS — R29818 Other symptoms and signs involving the nervous system: Secondary | ICD-10-CM | POA: Diagnosis not present

## 2023-05-23 NOTE — Progress Notes (Addendum)
Location:  [facility] Room Number: 101 Place of Service:  SNF (31)   CODE STATUS: full   Allergies  Allergen Reactions   Ticlid [Ticlopidine] Shortness Of Breath   Nsaids Other (See Comments)    Bleeding ulcer   Prednisone Other (See Comments)    Makes her stomach hurt--knows this isn't an allergy but doesn't want to take it    Trazodone And Nefazodone     hjallucinations   Monistat [Miconazole] Swelling and Rash   Myrbetriq Theodosia Paling Er] Rash    See 02/27/2023   Sulfa Antibiotics Rash    Chief Complaint  Patient presents with   Acute Visit    Care plan meeting    HPI:  We have come together for her care plan meeting. Family present  BIMS 15/15 mood: 10/30: decreased energy, not sleeping well; nervous; some depression. SLUMS 18/30.  She is nonambulatory with no falls. Requires max to dependent assist with her adls. She is incontinent of bladder and frequently incontinent of bowel. Dietary:  feeds self; regular diet appetite 75-100%; weight is 178.8 pounds. Therapy: none at this time.  Activities: does attend. She will continue to be followed for her chronic illnesses including:  Neurocognitive deficits   Vascular dementia without behavioral issues   Major depression with psychotic features MOST form has been filled out.  Her TD is worse with more movement in her face.   Custom AFO used for ambulation a permanent or long standing (> 6 months) need for AFO; neurological circulatory or orthopedic condition that requires custom fabricated AFO to present tissue energy. A healing fracture with abnormal anatomical presentation; she is nonambulatory       Past Medical History:  Diagnosis Date   Anemia    Arthritis    Asthma    Atrial fibrillation (HCC)    COVID-19 08/05/2022   Depression    H/O head injury    Hearing loss    Heart murmur    can be heard at times, Doctor said not to worry   History of kidney stones    History of stomach ulcers     Migraine    Pneumonia    Psittacosis    Stroke (HCC)    Weakness of neck 03/02/2013    Past Surgical History:  Procedure Laterality Date   ANKLE FRACTURE SURGERY Left    APPENDECTOMY     BREAST LUMPECTOMY     CESAREAN SECTION     x 3   I & D KNEE WITH POLY EXCHANGE Left 08/16/2022   Procedure: KNEE POLY EXCHANGE;  Surgeon: Bjorn Pippin, MD;  Location: [facility] OR;  Service: Orthopedics;  Laterality: Left;   KNEE ARTHROSCOPY WITH PATELLAR TENDON REPAIR Left 08/16/2022   Procedure: IRRIGATION AND DEBRIDEMENT LEFT KNEE REVISION WITH PATELLAR TENDON REPAIR and poly exchange;  Surgeon: Bjorn Pippin, MD;  Location: [facility] OR;  Service: Orthopedics;  Laterality: Left;   PARTIAL HYSTERECTOMY     ROTATOR CUFF REPAIR Right    TOTAL KNEE ARTHROPLASTY Left 07/22/2022   Procedure: TOTAL KNEE ARTHROPLASTY;  Surgeon: Joen Laura, MD;  Location: [facility];  Service: Orthopedics;  Laterality: Left;    Social History   Socioeconomic History   Marital status: Married    Spouse name: Not on file   Number of children: 3   Years of education: Not on file   Highest education level: Not on file  Occupational History   Occupation: retired  Tobacco Use  Smoking status: Never   Smokeless tobacco: Never  Vaping Use   Vaping status: Never Used  Substance and Sexual Activity   Alcohol use: Not Currently    Comment: occ beer   Drug use: No   Sexual activity: Yes    Birth control/protection: Surgical    Comment: hyst  Other Topics Concern   Not on file  Social History Narrative   Not on file   Social Determinants of Health   Financial Resource Strain: Medium Risk (01/30/2021)   Overall Financial Resource Strain (CARDIA)    Difficulty of Paying Living Expenses: Somewhat hard  Food Insecurity: No Food Insecurity (08/15/2022)   Hunger Vital Sign    Worried About Running Out of Food in the Last Year: Never true    Ran Out of Food in the Last Year: Never true  Transportation Needs: No  Transportation Needs (08/15/2022)   PRAPARE - Administrator, Civil Service (Medical): No    Lack of Transportation (Non-Medical): No  Physical Activity: Insufficiently Active (01/30/2021)   Exercise Vital Sign    Days of Exercise per Week: 2 days    Minutes of Exercise per Session: 10 min  Stress: No Stress Concern Present (01/30/2021)   [employer] of Occupational Health - Occupational Stress Questionnaire    Feeling of Stress : Only a little  Social Connections: Moderately Integrated (01/30/2021)   Social Connection and Isolation Panel [NHANES]    Frequency of Communication with Friends and Family: Twice a week    Frequency of Social Gatherings with Friends and Family: Twice a week    Attends Religious Services: More than 4 times per year    Active Member of [employer] or Organizations: No    Attends Banker Meetings: Never    Marital Status: Married  Catering manager Violence: Not At Risk (08/15/2022)   Humiliation, Afraid, Rape, and Kick questionnaire    Fear of Current or Ex-Partner: No    Emotionally Abused: No    Physically Abused: No    Sexually Abused: No   Family History  Problem Relation Age of Onset   Cancer Father    Heart disease Brother    Alcohol abuse Brother    Depression Maternal Grandmother    Depression Grandchild    Breast cancer Daughter       VITAL SIGNS BP 128/68   Pulse 72   Temp 97.8 F (36.6 C)   Resp (!) 22   Ht 5\' 5"  (1.651 m)   Wt 178 lb 6.4 oz (80.9 kg)   SpO2 98%   BMI 29.69 kg/m   Outpatient Encounter Medications as of 05/23/2023  Medication Sig   acetaminophen (TYLENOL) 650 MG CR tablet Take 650 mg by mouth at bedtime.   apixaban (ELIQUIS) 5 MG TABS tablet Take 1 tablet (5 mg total) by mouth 2 (two) times daily.   ARIPiprazole (ABILIFY) 10 MG tablet Take 10 mg by mouth daily.   busPIRone (BUSPAR) 30 MG tablet Take 1 tablet (30 mg total) by mouth 2 (two) times daily.   Deutetrabenazine (AUSTEDO) 6 MG TABS  Take 6 mg by mouth daily at 2 PM. For total dose of 30 mg daily   Deutetrabenazine ER (AUSTEDO XR) 24 MG TB24 Take 25 mg by mouth daily at 2 PM.   donepezil (ARICEPT) 10 MG tablet Take 10 mg by mouth at bedtime.   DULoxetine (CYMBALTA) 60 MG capsule Take 60 mg by mouth daily.   eszopiclone (LUNESTA) 1 MG  TABS tablet Take 1 tablet (1 mg total) by mouth at bedtime. Take immediately before bedtime   fexofenadine (ALLEGRA) 180 MG tablet Take 180 mg by mouth daily.   furosemide (LASIX) 20 MG tablet Take 20 mg by mouth.   Magnesium Hydroxide (MILK OF MAGNESIA PO) Take 30 mLs by mouth as needed (constipation).   metoprolol tartrate (LOPRESSOR) 25 MG tablet Take 1 tablet (25 mg total) by mouth 2 (two) times daily.   modafinil (PROVIGIL) 200 MG tablet Take 0.5 tablets (100 mg total) by mouth daily.   Omeprazole 20 MG TBDD Take 20 mg by mouth daily as needed.   polyethylene glycol (MIRALAX / GLYCOLAX) 17 g packet Take 17 g by mouth daily as needed.   rosuvastatin (CRESTOR) 5 MG tablet Take 5 mg by mouth at bedtime.   senna (SENOKOT) 8.6 MG TABS tablet Take 1 tablet (8.6 mg total) by mouth 2 (two) times daily.   SUMAtriptan (IMITREX) 100 MG tablet Take 100 mg by mouth every 2 (two) hours as needed for migraine or headache. May repeat in 2 hours if headache persists or recurs.   UNABLE TO FIND Diet: Regular   Vitamins A & D (VITAMIN A & D) ointment Apply 1 Application topically 2 (two) times daily. To left heel   ZINC OXIDE EX Apply 1 Application topically See admin instructions. every shift and prn Special Instructions: Apply to sacrum, bilateral buttocks and coccyx q shift for redness.   No facility-administered encounter medications on file as of 05/23/2023.     SIGNIFICANT DIAGNOSTIC EXAMS   PREVIOUS   08-16-22: chest x-ray: 1. No acute intrathoracic process.   08-21-22: ct of head:  1. Possible sulcal effacement and loss of gray-white differentiation in the posterior right frontal lobe may  represent subacute infarct versus artifact. Consider further evaluation with MRI. 2. Mild chronic small vessel ischemia.  08-22-22: MRI head:  1. No acute intracranial abnormality. 2. Findings of chronic small vessel ischemia and volume loss.  NO NEW EXAMS   LABS REVIEWED PREVIOUS   08-04-22: wbc 6.9; hgb 10.1; hct 31.8; mcv 97.5 plt 371; glucose 93; bun 15; creat 0.66; k+ 4.0; na++ 135; ca 8.8; gfr >60 mag 1.8; tsh 1.460; free t3: 1.8; free t4: 1.02 08-14-22: wbc 9.3; hgb 10.5; hct 31.8 mcv 94.9 plt 324; glucose 93; bun 15 creat 0.66; k+ 3.6; na++ 138; ca 9.1; gfr >60 08-23-22: wbc 9.1; hgb 10.2; hct 31.8; mcv 94.9 plt 379'; glucose 100; bun 12; creat 0.61; k+ 4.3; na++ 139; ca 9.0; gfr >60 10-11-22: glucose 87; bun 16; creat 0.59; k+ 3.7; na++ 137; ca 9.4; gfr >60 10-17-22: glucose 98; bun 17; creat 0.56; k+ 4.1; na++ 138; ca 9.4; gfr >60 11-14-22: hepatitis C nr 02-28-23: wbc 7.6; hgb 12.1; hct 375; mcv 100.5 plt 216   03-24-23: wbc 8.2; hgb 12.6; hct 39.5; mcv 101.1 plt 201; hgb A1c 5.4 chol 197; ldl 125; hdl 91; hdl 54   NO NEW LABS.   Review of Systems  Constitutional:  Negative for malaise/fatigue.  Respiratory:  Negative for cough and shortness of breath.   Cardiovascular:  Negative for chest pain, palpitations and leg swelling.  Gastrointestinal:  Negative for abdominal pain, constipation and heartburn.  Musculoskeletal:  Negative for back pain, joint pain and myalgias.  Skin: Negative.   Neurological:  Negative for dizziness.  Psychiatric/Behavioral:  The patient is not nervous/anxious.    Physical Exam Constitutional:      General: She is not in acute distress.  Appearance: She is well-developed. She is not diaphoretic.  Neck:     Thyroid: No thyromegaly.  Cardiovascular:     Rate and Rhythm: Normal rate and regular rhythm.     Pulses: Normal pulses.     Heart sounds: Normal heart sounds.  Pulmonary:     Effort: Pulmonary effort is normal. No respiratory distress.      Breath sounds: Normal breath sounds.  Abdominal:     General: Bowel sounds are normal. There is no distension.     Palpations: Abdomen is soft.     Tenderness: There is no abdominal tenderness.  Musculoskeletal:        General: Normal range of motion.     Cervical back: Neck supple.     Right lower leg: No edema.     Left lower leg: No edema.  Lymphadenopathy:     Cervical: No cervical adenopathy.  Skin:    General: Skin is warm and dry.  Neurological:     Mental Status: She is alert. Mental status is at baseline.     Comments: Has abnormal facial movements      Psychiatric:        Mood and Affect: Mood normal.       ASSESSMENT/ PLAN:  TODAY  Neurocognitive deficits Vascular dementia without behavioral issues Major depression with psychotic features  Will increase austedo xr to 36 mg daily; may need to consider tetrabenazine in the future  Will continue current plan of care Will continue to monitor her status.   Time spent with patient: 40 minutes: 20 minutes spent with advanced directives MOST form filled ou. medications; activities dietary.    Synthia Innocent NP [facility]   call [phone]

## 2023-06-12 ENCOUNTER — Other Ambulatory Visit: Payer: Self-pay | Admitting: Adult Health

## 2023-06-12 MED ORDER — MODAFINIL 200 MG PO TABS: 100.0000 mg | ORAL_TABLET | Freq: Every day | ORAL | 0 refills | Status: DC

## 2023-06-12 MED ORDER — ESZOPICLONE 1 MG PO TABS: 1.0000 mg | ORAL_TABLET | Freq: Every day | ORAL | 0 refills | Status: DC

## 2023-06-18 DIAGNOSIS — G2401 Drug induced subacute dyskinesia: Secondary | ICD-10-CM | POA: Diagnosis not present

## 2023-06-18 DIAGNOSIS — F332 Major depressive disorder, recurrent severe without psychotic features: Secondary | ICD-10-CM | POA: Diagnosis not present

## 2023-06-18 DIAGNOSIS — F5105 Insomnia due to other mental disorder: Secondary | ICD-10-CM | POA: Diagnosis not present

## 2023-06-18 DIAGNOSIS — F411 Generalized anxiety disorder: Secondary | ICD-10-CM | POA: Diagnosis not present

## 2023-06-26 ENCOUNTER — Encounter: Payer: Self-pay | Admitting: Adult Health

## 2023-06-26 ENCOUNTER — Non-Acute Institutional Stay (SKILLED_NURSING_FACILITY): Payer: Self-pay | Admitting: Adult Health

## 2023-06-26 ENCOUNTER — Other Ambulatory Visit (HOSPITAL_COMMUNITY)
Admission: RE | Admit: 2023-06-26 | Discharge: 2023-06-26 | Disposition: A | Discharge status: Home / Self Care | Payer: PPO | Source: Skilled Nursing Facility | Attending: Internal Medicine | Admitting: Internal Medicine

## 2023-06-26 DIAGNOSIS — Z20828 Contact with and (suspected) exposure to other viral communicable diseases: Secondary | ICD-10-CM | POA: Insufficient documentation

## 2023-06-26 DIAGNOSIS — R768 Other specified abnormal immunological findings in serum: Secondary | ICD-10-CM | POA: Diagnosis not present

## 2023-06-26 DIAGNOSIS — Q791 Other congenital malformations of diaphragm: Secondary | ICD-10-CM | POA: Diagnosis not present

## 2023-06-26 DIAGNOSIS — U071 COVID-19: Secondary | ICD-10-CM | POA: Insufficient documentation

## 2023-06-26 LAB — BASIC METABOLIC PANEL
Anion gap: 7 (ref 5–15)
BUN: 14 mg/dL (ref 8–23)
CO2: 30 mmol/L (ref 22–32)
Calcium: 8.7 mg/dL — ABNORMAL LOW (ref 8.9–10.3)
Chloride: 98 mmol/L (ref 98–111)
Creatinine, Ser: 0.57 mg/dL (ref 0.44–1.00)
GFR, Estimated: >60 mL/min (ref >60)
Glucose, Bld: 97 mg/dL (ref 70–99)
Potassium: 3.9 mmol/L (ref 3.5–5.1)
Sodium: 135 mmol/L (ref 135–145)

## 2023-06-26 LAB — CBC
HCT: 36.2 % (ref 36.0–46.0)
Hemoglobin: 11.7 g/dL — ABNORMAL LOW (ref 12.0–15.0)
MCH: 31.8 pg (ref 26.0–34.0)
MCHC: 32.3 g/dL (ref 30.0–36.0)
MCV: 98.4 fL (ref 80.0–100.0)
Platelets: 160 10*3/uL (ref 150–400)
RBC: 3.68 MIL/uL — ABNORMAL LOW (ref 3.87–5.11)
RDW: 12.6 % (ref 11.5–15.5)
WBC: 6.1 10*3/uL (ref 4.0–10.5)
nRBC: 0 % (ref 0.0–0.2)

## 2023-06-26 LAB — C-REACTIVE PROTEIN: CRP: 3 mg/dL — ABNORMAL HIGH (ref <1.0)

## 2023-06-26 LAB — D-DIMER, QUANTITATIVE: D-Dimer, Quant: 0.28 ug{FEU}/mL (ref 0.00–0.50)

## 2023-06-26 NOTE — Progress Notes (Signed)
Location:  [facility] Room Number: 101 Place of Service:  SNF (31)   CODE STATUS: full   Allergies  Allergen Reactions   Ticlid [Ticlopidine] Shortness Of Breath   Nsaids Other (See Comments)    Bleeding ulcer   Prednisone Other (See Comments)    Makes her stomach hurt--knows this isn't an allergy but doesn't want to take it    Trazodone And Nefazodone     hjallucinations   Monistat [Miconazole] Swelling and Rash   Myrbetriq Theodosia Paling Er] Rash    See 02/27/2023   Sulfa Antibiotics Rash    Chief Complaint  Patient presents with   Acute Visit    Covid +    HPI:  She has tested positive for COVID. She denies any cough; no sore throat; no body aches. There are no reports of fevers present.   Past Medical History:  Diagnosis Date   Anemia    Arthritis    Asthma    Atrial fibrillation (HCC)    COVID-19 08/05/2022   Depression    H/O head injury    Hearing loss    Heart murmur    can be heard at times, Doctor said not to worry   History of kidney stones    History of stomach ulcers    Migraine    Pneumonia    Psittacosis    Stroke (HCC)    Weakness of neck 03/02/2013    Past Surgical History:  Procedure Laterality Date   ANKLE FRACTURE SURGERY Left    APPENDECTOMY     BREAST LUMPECTOMY     CESAREAN SECTION     x 3   I & D KNEE WITH POLY EXCHANGE Left 08/16/2022   Procedure: KNEE POLY EXCHANGE;  Surgeon: Bjorn Pippin, MD;  Location: [facility] OR;  Service: Orthopedics;  Laterality: Left;   KNEE ARTHROSCOPY WITH PATELLAR TENDON REPAIR Left 08/16/2022   Procedure: IRRIGATION AND DEBRIDEMENT LEFT KNEE REVISION WITH PATELLAR TENDON REPAIR and poly exchange;  Surgeon: Bjorn Pippin, MD;  Location: [facility] OR;  Service: Orthopedics;  Laterality: Left;   PARTIAL HYSTERECTOMY     ROTATOR CUFF REPAIR Right    TOTAL KNEE ARTHROPLASTY Left 07/22/2022   Procedure: TOTAL KNEE ARTHROPLASTY;  Surgeon: Joen Laura, MD;  Location: [facility];  Service:  Orthopedics;  Laterality: Left;    Social History   Socioeconomic History   Marital status: Married    Spouse name: Not on file   Number of children: 3   Years of education: Not on file   Highest education level: Not on file  Occupational History   Occupation: retired  Tobacco Use   Smoking status: Never   Smokeless tobacco: Never  Vaping Use   Vaping status: Never Used  Substance and Sexual Activity   Alcohol use: Not Currently    Comment: occ beer   Drug use: No   Sexual activity: Yes    Birth control/protection: Surgical    Comment: hyst  Other Topics Concern   Not on file  Social History Narrative   Not on file   Social Determinants of Health   Financial Resource Strain: Medium Risk (01/30/2021)   Overall Financial Resource Strain (CARDIA)    Difficulty of Paying Living Expenses: Somewhat hard  Food Insecurity: No Food Insecurity (08/15/2022)   Hunger Vital Sign    Worried About Running Out of Food in the Last Year: Never true    Ran Out of Food in the  Last Year: Never true  Transportation Needs: No Transportation Needs (08/15/2022)   PRAPARE - Administrator, Civil Service (Medical): No    Lack of Transportation (Non-Medical): No  Physical Activity: Insufficiently Active (01/30/2021)   Exercise Vital Sign    Days of Exercise per Week: 2 days    Minutes of Exercise per Session: 10 min  Stress: No Stress Concern Present (01/30/2021)   [employer] of Occupational Health - Occupational Stress Questionnaire    Feeling of Stress : Only a little  Social Connections: Moderately Integrated (01/30/2021)   Social Connection and Isolation Panel [NHANES]    Frequency of Communication with Friends and Family: Twice a week    Frequency of Social Gatherings with Friends and Family: Twice a week    Attends Religious Services: More than 4 times per year    Active Member of [employer] or Organizations: No    Attends Banker Meetings: Never    Marital  Status: Married  Catering manager Violence: Not At Risk (08/15/2022)   Humiliation, Afraid, Rape, and Kick questionnaire    Fear of Current or Ex-Partner: No    Emotionally Abused: No    Physically Abused: No    Sexually Abused: No   Family History  Problem Relation Age of Onset   Cancer Father    Heart disease Brother    Alcohol abuse Brother    Depression Maternal Grandmother    Depression Grandchild    Breast cancer Daughter       VITAL SIGNS BP (!) 156/96   Pulse 72   Temp 98.7 F (37.1 C)   Resp 20   Ht 5\' 5"  (1.651 m)   Wt 178 lb 3.2 oz (80.8 kg)   SpO2 96%   BMI 29.65 kg/m   Outpatient Encounter Medications as of 06/26/2023  Medication Sig   acetaminophen (TYLENOL) 650 MG CR tablet Take 650 mg by mouth at bedtime.   apixaban (ELIQUIS) 5 MG TABS tablet Take 1 tablet (5 mg total) by mouth 2 (two) times daily.   ARIPiprazole (ABILIFY) 10 MG tablet Take 10 mg by mouth daily.   busPIRone (BUSPAR) 30 MG tablet Take 1 tablet (30 mg total) by mouth 2 (two) times daily.   Deutetrabenazine (AUSTEDO) 6 MG TABS Take 6 mg by mouth daily at 2 PM. For total dose of 30 mg daily   Deutetrabenazine ER (AUSTEDO XR) 24 MG TB24 Take 25 mg by mouth daily at 2 PM.   donepezil (ARICEPT) 10 MG tablet Take 10 mg by mouth at bedtime.   DULoxetine (CYMBALTA) 60 MG capsule Take 60 mg by mouth daily.   eszopiclone (LUNESTA) 1 MG TABS tablet Take 1 tablet (1 mg total) by mouth at bedtime. Take immediately before bedtime   fexofenadine (ALLEGRA) 180 MG tablet Take 180 mg by mouth daily.   furosemide (LASIX) 20 MG tablet Take 20 mg by mouth.   Magnesium Hydroxide (MILK OF MAGNESIA PO) Take 30 mLs by mouth as needed (constipation).   metoprolol tartrate (LOPRESSOR) 25 MG tablet Take 1 tablet (25 mg total) by mouth 2 (two) times daily.   modafinil (PROVIGIL) 200 MG tablet Take 0.5 tablets (100 mg total) by mouth daily.   Omeprazole 20 MG TBDD Take 20 mg by mouth daily as needed.   polyethylene  glycol (MIRALAX / GLYCOLAX) 17 g packet Take 17 g by mouth daily as needed.   rosuvastatin (CRESTOR) 5 MG tablet Take 5 mg by mouth at bedtime.  senna (SENOKOT) 8.6 MG TABS tablet Take 1 tablet (8.6 mg total) by mouth 2 (two) times daily.   SUMAtriptan (IMITREX) 100 MG tablet Take 100 mg by mouth every 2 (two) hours as needed for migraine or headache. May repeat in 2 hours if headache persists or recurs.   UNABLE TO FIND Diet: Regular   Vitamins A & D (VITAMIN A & D) ointment Apply 1 Application topically 2 (two) times daily. To left heel   ZINC OXIDE EX Apply 1 Application topically See admin instructions. every shift and prn Special Instructions: Apply to sacrum, bilateral buttocks and coccyx q shift for redness.   No facility-administered encounter medications on file as of 06/26/2023.     SIGNIFICANT DIAGNOSTIC EXAMS  PREVIOUS   08-16-22: chest x-ray: 1. No acute intrathoracic process.   08-21-22: ct of head:  1. Possible sulcal effacement and loss of gray-white differentiation in the posterior right frontal lobe may represent subacute infarct versus artifact. Consider further evaluation with MRI. 2. Mild chronic small vessel ischemia.  08-22-22: MRI head:  1. No acute intracranial abnormality. 2. Findings of chronic small vessel ischemia and volume loss.  NO NEW EXAMS   LABS REVIEWED PREVIOUS   08-04-22: wbc 6.9; hgb 10.1; hct 31.8; mcv 97.5 plt 371; glucose 93; bun 15; creat 0.66; k+ 4.0; na++ 135; ca 8.8; gfr >60 mag 1.8; tsh 1.460; free t3: 1.8; free t4: 1.02 08-14-22: wbc 9.3; hgb 10.5; hct 31.8 mcv 94.9 plt 324; glucose 93; bun 15 creat 0.66; k+ 3.6; na++ 138; ca 9.1; gfr >60 08-23-22: wbc 9.1; hgb 10.2; hct 31.8; mcv 94.9 plt 379'; glucose 100; bun 12; creat 0.61; k+ 4.3; na++ 139; ca 9.0; gfr >60 10-11-22: glucose 87; bun 16; creat 0.59; k+ 3.7; na++ 137; ca 9.4; gfr >60 10-17-22: glucose 98; bun 17; creat 0.56; k+ 4.1; na++ 138; ca 9.4; gfr >60 11-14-22: hepatitis C  nr 02-28-23: wbc 7.6; hgb 12.1; hct 375; mcv 100.5 plt 216   03-24-23: wbc 8.2; hgb 12.6; hct 39.5; mcv 101.1 plt 201; hgb A1c 5.4 chol 197; ldl 125; hdl 91; hdl 54   TODAY  06-26-23: wbc 6.1; hgb 11.7; hct 36.2; mcv 98.4 plt 160; glucose 97; bun 14; creat 0.57; k+ 3.9; na++ 135; ca 8.7; gfr >60 d-dimer 0.28   Review of Systems  Constitutional:  Negative for malaise/fatigue.  Respiratory:  Negative for cough and shortness of breath.   Cardiovascular:  Negative for chest pain, palpitations and leg swelling.  Gastrointestinal:  Negative for abdominal pain, constipation and heartburn.  Musculoskeletal:  Negative for back pain, joint pain and myalgias.  Skin: Negative.   Neurological:  Negative for dizziness.  Psychiatric/Behavioral:  The patient is not nervous/anxious.     Physical Exam Constitutional:      General: She is not in acute distress.    Appearance: She is well-developed. She is not diaphoretic.  Neck:     Thyroid: No thyromegaly.  Cardiovascular:     Rate and Rhythm: Normal rate and regular rhythm.     Pulses: Normal pulses.     Heart sounds: Normal heart sounds.  Pulmonary:     Effort: Pulmonary effort is normal. No respiratory distress.     Breath sounds: Normal breath sounds.  Abdominal:     General: Bowel sounds are normal. There is no distension.     Palpations: Abdomen is soft.     Tenderness: There is no abdominal tenderness.  Musculoskeletal:  General: Normal range of motion.     Cervical back: Neck supple.     Right lower leg: No edema.     Left lower leg: No edema.  Lymphadenopathy:     Cervical: No cervical adenopathy.  Skin:    General: Skin is warm and dry.  Neurological:     Mental Status: She is alert. Mental status is at baseline.     Comments: Has abnormal facial movements     Psychiatric:        Mood and Affect: Mood normal.       ASSESSMENT/ PLAN:  TODAY  SARS-CoV2 antibody positive: will complete molnupiravir will await CRP  results and will monitor her status.    Carmen Innocent NP [facility] Adult [facility]  call [phone]

## 2023-06-27 ENCOUNTER — Encounter: Payer: Self-pay | Admitting: Adult Health

## 2023-06-27 ENCOUNTER — Non-Acute Institutional Stay (SKILLED_NURSING_FACILITY): Payer: PPO | Admitting: Adult Health

## 2023-06-27 DIAGNOSIS — R7982 Elevated C-reactive protein (CRP): Secondary | ICD-10-CM | POA: Diagnosis not present

## 2023-06-27 DIAGNOSIS — R7689 Other specified abnormal immunological findings in serum: Secondary | ICD-10-CM | POA: Insufficient documentation

## 2023-06-27 DIAGNOSIS — R768 Other specified abnormal immunological findings in serum: Secondary | ICD-10-CM

## 2023-06-27 HISTORY — DX: Elevated C-reactive protein (CRP): R79.82

## 2023-06-27 NOTE — Progress Notes (Signed)
Location:  [facility] Room Number: 101 Place of Service:  SNF (31)   CODE STATUS: full   Allergies  Allergen Reactions   Ticlid [Ticlopidine] Shortness Of Breath   Nsaids Other (See Comments)    Bleeding ulcer   Prednisone Other (See Comments)    Makes her stomach hurt--knows this isn't an allergy but doesn't want to take it    Trazodone And Nefazodone     hjallucinations   Monistat [Miconazole] Swelling and Rash   Myrbetriq Theodosia Paling Er] Rash    See 02/27/2023   Sulfa Antibiotics Rash    Chief Complaint  Patient presents with   Acute Visit    Follow up labs     HPI:  She does not like to take prednisone. However; her CRP is 3.0  She will need a short burst of steroids to reduce the inflammation that is present. There are no reports of fevers she continues with molnupiravir therapy. There are no reports of cough; or sore throat.   Past Medical History:  Diagnosis Date   Anemia    Arthritis    Asthma    Atrial fibrillation (HCC)    COVID-19 08/05/2022   Depression    H/O head injury    Hearing loss    Heart murmur    can be heard at times, Doctor said not to worry   History of kidney stones    History of stomach ulcers    Migraine    Pneumonia    Psittacosis    Stroke (HCC)    Weakness of neck 03/02/2013    Past Surgical History:  Procedure Laterality Date   ANKLE FRACTURE SURGERY Left    APPENDECTOMY     BREAST LUMPECTOMY     CESAREAN SECTION     x 3   I & D KNEE WITH POLY EXCHANGE Left 08/16/2022   Procedure: KNEE POLY EXCHANGE;  Surgeon: Bjorn Pippin, MD;  Location: [facility] OR;  Service: Orthopedics;  Laterality: Left;   KNEE ARTHROSCOPY WITH PATELLAR TENDON REPAIR Left 08/16/2022   Procedure: IRRIGATION AND DEBRIDEMENT LEFT KNEE REVISION WITH PATELLAR TENDON REPAIR and poly exchange;  Surgeon: Bjorn Pippin, MD;  Location: [facility] OR;  Service: Orthopedics;  Laterality: Left;   PARTIAL HYSTERECTOMY     ROTATOR CUFF REPAIR Right     TOTAL KNEE ARTHROPLASTY Left 07/22/2022   Procedure: TOTAL KNEE ARTHROPLASTY;  Surgeon: Joen Laura, MD;  Location: [facility];  Service: Orthopedics;  Laterality: Left;    Social History   Socioeconomic History   Marital status: Married    Spouse name: Not on file   Number of children: 3   Years of education: Not on file   Highest education level: Not on file  Occupational History   Occupation: retired  Tobacco Use   Smoking status: Never   Smokeless tobacco: Never  Vaping Use   Vaping status: Never Used  Substance and Sexual Activity   Alcohol use: Not Currently    Comment: occ beer   Drug use: No   Sexual activity: Yes    Birth control/protection: Surgical    Comment: hyst  Other Topics Concern   Not on file  Social History Narrative   Not on file   Social Determinants of Health   Financial Resource Strain: Medium Risk (01/30/2021)   Overall Financial Resource Strain (CARDIA)    Difficulty of Paying Living Expenses: Somewhat hard  Food Insecurity: No Food Insecurity (08/15/2022)  Hunger Vital Sign    Worried About Running Out of Food in the Last Year: Never true    Ran Out of Food in the Last Year: Never true  Transportation Needs: No Transportation Needs (08/15/2022)   PRAPARE - Administrator, Civil Service (Medical): No    Lack of Transportation (Non-Medical): No  Physical Activity: Insufficiently Active (01/30/2021)   Exercise Vital Sign    Days of Exercise per Week: 2 days    Minutes of Exercise per Session: 10 min  Stress: No Stress Concern Present (01/30/2021)   [employer] of Occupational Health - Occupational Stress Questionnaire    Feeling of Stress : Only a little  Social Connections: Moderately Integrated (01/30/2021)   Social Connection and Isolation Panel [NHANES]    Frequency of Communication with Friends and Family: Twice a week    Frequency of Social Gatherings with Friends and Family: Twice a week    Attends Religious  Services: More than 4 times per year    Active Member of [employer] or Organizations: No    Attends Banker Meetings: Never    Marital Status: Married  Catering manager Violence: Not At Risk (08/15/2022)   Humiliation, Afraid, Rape, and Kick questionnaire    Fear of Current or Ex-Partner: No    Emotionally Abused: No    Physically Abused: No    Sexually Abused: No   Family History  Problem Relation Age of Onset   Cancer Father    Heart disease Brother    Alcohol abuse Brother    Depression Maternal Grandmother    Depression Grandchild    Breast cancer Daughter       VITAL SIGNS BP 121/68   Pulse 70   Temp 98.1 F (36.7 C)   Resp 20   Ht 5\' 5"  (1.651 m)   Wt 178 lb 3.2 oz (80.8 kg)   SpO2 99%   BMI 29.65 kg/m   Outpatient Encounter Medications as of 06/27/2023  Medication Sig   acetaminophen (TYLENOL) 650 MG CR tablet Take 650 mg by mouth at bedtime.   apixaban (ELIQUIS) 5 MG TABS tablet Take 1 tablet (5 mg total) by mouth 2 (two) times daily.   ARIPiprazole (ABILIFY) 10 MG tablet Take 10 mg by mouth daily.   busPIRone (BUSPAR) 30 MG tablet Take 1 tablet (30 mg total) by mouth 2 (two) times daily.   Deutetrabenazine (AUSTEDO) 6 MG TABS Take 6 mg by mouth daily at 2 PM. For total dose of 30 mg daily   Deutetrabenazine ER (AUSTEDO XR) 24 MG TB24 Take 25 mg by mouth daily at 2 PM.   donepezil (ARICEPT) 10 MG tablet Take 10 mg by mouth at bedtime.   DULoxetine (CYMBALTA) 60 MG capsule Take 60 mg by mouth daily.   eszopiclone (LUNESTA) 1 MG TABS tablet Take 1 tablet (1 mg total) by mouth at bedtime. Take immediately before bedtime   fexofenadine (ALLEGRA) 180 MG tablet Take 180 mg by mouth daily.   furosemide (LASIX) 20 MG tablet Take 20 mg by mouth.   Magnesium Hydroxide (MILK OF MAGNESIA PO) Take 30 mLs by mouth as needed (constipation).   metoprolol tartrate (LOPRESSOR) 25 MG tablet Take 1 tablet (25 mg total) by mouth 2 (two) times daily.   modafinil (PROVIGIL)  200 MG tablet Take 0.5 tablets (100 mg total) by mouth daily.   Omeprazole 20 MG TBDD Take 20 mg by mouth daily as needed.   polyethylene glycol (MIRALAX / GLYCOLAX)  17 g packet Take 17 g by mouth daily as needed.   rosuvastatin (CRESTOR) 5 MG tablet Take 5 mg by mouth at bedtime.   senna (SENOKOT) 8.6 MG TABS tablet Take 1 tablet (8.6 mg total) by mouth 2 (two) times daily.   SUMAtriptan (IMITREX) 100 MG tablet Take 100 mg by mouth every 2 (two) hours as needed for migraine or headache. May repeat in 2 hours if headache persists or recurs.   UNABLE TO FIND Diet: Regular   Vitamins A & D (VITAMIN A & D) ointment Apply 1 Application topically 2 (two) times daily. To left heel   ZINC OXIDE EX Apply 1 Application topically See admin instructions. every shift and prn Special Instructions: Apply to sacrum, bilateral buttocks and coccyx q shift for redness.   No facility-administered encounter medications on file as of 06/27/2023.     SIGNIFICANT DIAGNOSTIC EXAMS  PREVIOUS   08-16-22: chest x-ray: 1. No acute intrathoracic process.   08-21-22: ct of head:  1. Possible sulcal effacement and loss of gray-white differentiation in the posterior right frontal lobe may represent subacute infarct versus artifact. Consider further evaluation with MRI. 2. Mild chronic small vessel ischemia.  08-22-22: MRI head:  1. No acute intracranial abnormality. 2. Findings of chronic small vessel ischemia and volume loss.  NO NEW EXAMS   LABS REVIEWED PREVIOUS   08-04-22: wbc 6.9; hgb 10.1; hct 31.8; mcv 97.5 plt 371; glucose 93; bun 15; creat 0.66; k+ 4.0; na++ 135; ca 8.8; gfr >60 mag 1.8; tsh 1.460; free t3: 1.8; free t4: 1.02 08-14-22: wbc 9.3; hgb 10.5; hct 31.8 mcv 94.9 plt 324; glucose 93; bun 15 creat 0.66; k+ 3.6; na++ 138; ca 9.1; gfr >60 08-23-22: wbc 9.1; hgb 10.2; hct 31.8; mcv 94.9 plt 379'; glucose 100; bun 12; creat 0.61; k+ 4.3; na++ 139; ca 9.0; gfr >60 10-11-22: glucose 87; bun 16; creat 0.59;  k+ 3.7; na++ 137; ca 9.4; gfr >60 10-17-22: glucose 98; bun 17; creat 0.56; k+ 4.1; na++ 138; ca 9.4; gfr >60 11-14-22: hepatitis C nr 02-28-23: wbc 7.6; hgb 12.1; hct 375; mcv 100.5 plt 216   03-24-23: wbc 8.2; hgb 12.6; hct 39.5; mcv 101.1 plt 201; hgb A1c 5.4 chol 197; ldl 125; hdl 91; hdl 54   TODAY  06-26-23: wbc 6.1; hgb 11.7; hct 36.2; mcv 98.4 plt 160; glucose 97; bun 14; creat 0.57; k+ 3.9; na++ 135; ca 8.7; gfr >60 d-dimer 0.28  CRP 3.0  Review of Systems  Constitutional:  Negative for malaise/fatigue.  Respiratory:  Negative for cough and shortness of breath.   Cardiovascular:  Negative for chest pain, palpitations and leg swelling.  Gastrointestinal:  Negative for abdominal pain, constipation and heartburn.  Musculoskeletal:  Negative for back pain, joint pain and myalgias.  Skin: Negative.   Neurological:  Negative for dizziness.  Psychiatric/Behavioral:  The patient is not nervous/anxious.     Physical Exam Constitutional:      General: She is not in acute distress.    Appearance: She is well-developed. She is not diaphoretic.  Neck:     Thyroid: No thyromegaly.  Cardiovascular:     Rate and Rhythm: Normal rate and regular rhythm.     Pulses: Normal pulses.     Heart sounds: Normal heart sounds.  Pulmonary:     Effort: Pulmonary effort is normal. No respiratory distress.     Breath sounds: Normal breath sounds.  Abdominal:     General: Bowel sounds are normal. There is  no distension.     Palpations: Abdomen is soft.     Tenderness: There is no abdominal tenderness.  Musculoskeletal:        General: Normal range of motion.     Cervical back: Neck supple.     Right lower leg: No edema.     Left lower leg: No edema.  Lymphadenopathy:     Cervical: No cervical adenopathy.  Skin:    General: Skin is warm and dry.  Neurological:     Mental Status: She is alert. Mental status is at baseline.     Comments: Has abnormal facial movements      Psychiatric:        Mood  and Affect: Mood normal.       ASSESSMENT/ PLAN:  TODAY  SARS - CoV2 antibody positive Elevated CRP Will begin prednisone 40 mg daily through 06-30-23 and will monitor her status.    Synthia Innocent NP [facility]  call [phone]

## 2023-06-30 ENCOUNTER — Non-Acute Institutional Stay (SKILLED_NURSING_FACILITY): Payer: PPO | Admitting: Adult Health

## 2023-06-30 ENCOUNTER — Encounter: Payer: Self-pay | Admitting: Adult Health

## 2023-06-30 DIAGNOSIS — G43909 Migraine, unspecified, not intractable, without status migrainosus: Secondary | ICD-10-CM | POA: Diagnosis not present

## 2023-06-30 DIAGNOSIS — I4811 Longstanding persistent atrial fibrillation: Secondary | ICD-10-CM | POA: Diagnosis not present

## 2023-06-30 DIAGNOSIS — Z86718 Personal history of other venous thrombosis and embolism: Secondary | ICD-10-CM

## 2023-06-30 NOTE — Progress Notes (Unsigned)
Location:  [facility] Room Number: 101 Place of Service:  SNF (31)   CODE STATUS: full   Allergies  Allergen Reactions   Ticlid [Ticlopidine] Shortness Of Breath   Nsaids Other (See Comments)    Bleeding ulcer   Prednisone Other (See Comments)    Makes her stomach hurt--knows this isn't an allergy but doesn't want to take it    Trazodone And Nefazodone     hjallucinations   Monistat [Miconazole] Swelling and Rash   Myrbetriq Theodosia Paling Er] Rash    See 02/27/2023   Sulfa Antibiotics Rash    Chief Complaint  Patient presents with   Medical Management of Chronic Issues          Longstanding persistent atrial fibrillation/history of DVT:   Migraine without status migrainosus, nonintractable unspecified type:    Uncomplicated asthma unspecified asthma severity unspecified: unspecified whether persistent:     HPI:  She is a 77 year old long term resident of this facility being seen for the management of her chronic illnesses:  Longstanding persistent atrial fibrillation/history of DVT:   Migraine without status migrainosus, nonintractable unspecified type:    Uncomplicated asthma unspecified asthma severity unspecified: unspecified whether persistent. There are no reports of uncontrolled pain. She continues to have periodic episodes of TD. She has completed covid antiviral medications.   Past Medical History:  Diagnosis Date   Anemia    Arthritis    Asthma    Atrial fibrillation (HCC)    COVID-19 08/05/2022   Depression    H/O head injury    Hearing loss    Heart murmur    can be heard at times, Doctor said not to worry   History of kidney stones    History of stomach ulcers    Migraine    Pneumonia    Psittacosis    Stroke (HCC)    Weakness of neck 03/02/2013    Past Surgical History:  Procedure Laterality Date   ANKLE FRACTURE SURGERY Left    APPENDECTOMY     BREAST LUMPECTOMY     CESAREAN SECTION     x 3   I & D KNEE WITH POLY  EXCHANGE Left 08/16/2022   Procedure: KNEE POLY EXCHANGE;  Surgeon: Bjorn Pippin, MD;  Location: [facility] OR;  Service: Orthopedics;  Laterality: Left;   KNEE ARTHROSCOPY WITH PATELLAR TENDON REPAIR Left 08/16/2022   Procedure: IRRIGATION AND DEBRIDEMENT LEFT KNEE REVISION WITH PATELLAR TENDON REPAIR and poly exchange;  Surgeon: Bjorn Pippin, MD;  Location: [facility] OR;  Service: Orthopedics;  Laterality: Left;   PARTIAL HYSTERECTOMY     ROTATOR CUFF REPAIR Right    TOTAL KNEE ARTHROPLASTY Left 07/22/2022   Procedure: TOTAL KNEE ARTHROPLASTY;  Surgeon: Joen Laura, MD;  Location: [facility];  Service: Orthopedics;  Laterality: Left;    Social History   Socioeconomic History   Marital status: Married    Spouse name: Not on file   Number of children: 3   Years of education: Not on file   Highest education level: Not on file  Occupational History   Occupation: retired  Tobacco Use   Smoking status: Never   Smokeless tobacco: Never  Vaping Use   Vaping status: Never Used  Substance and Sexual Activity   Alcohol use: Not Currently    Comment: occ beer   Drug use: No   Sexual activity: Yes    Birth control/protection: Surgical    Comment: hyst  Other Topics  Concern   Not on file  Social History Narrative   Not on file   Social Determinants of Health   Financial Resource Strain: Medium Risk (01/30/2021)   Overall Financial Resource Strain (CARDIA)    Difficulty of Paying Living Expenses: Somewhat hard  Food Insecurity: No Food Insecurity (08/15/2022)   Hunger Vital Sign    Worried About Running Out of Food in the Last Year: Never true    Ran Out of Food in the Last Year: Never true  Transportation Needs: No Transportation Needs (08/15/2022)   PRAPARE - Administrator, Civil Service (Medical): No    Lack of Transportation (Non-Medical): No  Physical Activity: Insufficiently Active (01/30/2021)   Exercise Vital Sign    Days of Exercise per Week: 2 days    Minutes of  Exercise per Session: 10 min  Stress: No Stress Concern Present (01/30/2021)   [employer] of Occupational Health - Occupational Stress Questionnaire    Feeling of Stress : Only a little  Social Connections: Moderately Integrated (01/30/2021)   Social Connection and Isolation Panel [NHANES]    Frequency of Communication with Friends and Family: Twice a week    Frequency of Social Gatherings with Friends and Family: Twice a week    Attends Religious Services: More than 4 times per year    Active Member of [employer] or Organizations: No    Attends Banker Meetings: Never    Marital Status: Married  Catering manager Violence: Not At Risk (08/15/2022)   Humiliation, Afraid, Rape, and Kick questionnaire    Fear of Current or Ex-Partner: No    Emotionally Abused: No    Physically Abused: No    Sexually Abused: No   Family History  Problem Relation Age of Onset   Cancer Father    Heart disease Brother    Alcohol abuse Brother    Depression Maternal Grandmother    Depression Grandchild    Breast cancer Daughter       VITAL SIGNS BP 126/72   Pulse 72   Temp (!) 97.2 F (36.2 C)   Resp 20   Ht 5\' 5"  (1.651 m)   Wt 178 lb 3.2 oz (80.8 kg)   SpO2 96%   BMI 29.65 kg/m   Outpatient Encounter Medications as of 06/30/2023  Medication Sig   acetaminophen (TYLENOL) 650 MG CR tablet Take 650 mg by mouth at bedtime.   apixaban (ELIQUIS) 5 MG TABS tablet Take 1 tablet (5 mg total) by mouth 2 (two) times daily.   ARIPiprazole (ABILIFY) 10 MG tablet Take 10 mg by mouth daily.   busPIRone (BUSPAR) 30 MG tablet Take 1 tablet (30 mg total) by mouth 2 (two) times daily.   Deutetrabenazine (AUSTEDO) 6 MG TABS Take 6 mg by mouth daily at 2 PM. For total dose of 30 mg daily   Deutetrabenazine ER (AUSTEDO XR) 24 MG TB24 Take 25 mg by mouth daily at 2 PM.   donepezil (ARICEPT) 10 MG tablet Take 10 mg by mouth at bedtime.   DULoxetine (CYMBALTA) 60 MG capsule Take 60 mg by mouth daily.    eszopiclone (LUNESTA) 1 MG TABS tablet Take 1 tablet (1 mg total) by mouth at bedtime. Take immediately before bedtime   fexofenadine (ALLEGRA) 180 MG tablet Take 180 mg by mouth daily.   furosemide (LASIX) 20 MG tablet Take 20 mg by mouth.   Magnesium Hydroxide (MILK OF MAGNESIA PO) Take 30 mLs by mouth as needed (constipation).  metoprolol tartrate (LOPRESSOR) 25 MG tablet Take 1 tablet (25 mg total) by mouth 2 (two) times daily.   modafinil (PROVIGIL) 200 MG tablet Take 0.5 tablets (100 mg total) by mouth daily.   Omeprazole 20 MG TBDD Take 20 mg by mouth daily as needed.   polyethylene glycol (MIRALAX / GLYCOLAX) 17 g packet Take 17 g by mouth daily as needed.   rosuvastatin (CRESTOR) 5 MG tablet Take 5 mg by mouth at bedtime.   senna (SENOKOT) 8.6 MG TABS tablet Take 1 tablet (8.6 mg total) by mouth 2 (two) times daily.   SUMAtriptan (IMITREX) 100 MG tablet Take 100 mg by mouth every 2 (two) hours as needed for migraine or headache. May repeat in 2 hours if headache persists or recurs.   UNABLE TO FIND Diet: Regular   Vitamins A & D (VITAMIN A & D) ointment Apply 1 Application topically 2 (two) times daily. To left heel   ZINC OXIDE EX Apply 1 Application topically See admin instructions. every shift and prn Special Instructions: Apply to sacrum, bilateral buttocks and coccyx q shift for redness.   No facility-administered encounter medications on file as of 06/30/2023.     SIGNIFICANT DIAGNOSTIC EXAMS  PREVIOUS   08-16-22: chest x-ray: 1. No acute intrathoracic process.   08-21-22: ct of head:  1. Possible sulcal effacement and loss of gray-white differentiation in the posterior right frontal lobe may represent subacute infarct versus artifact. Consider further evaluation with MRI. 2. Mild chronic small vessel ischemia.  08-22-22: MRI head:  1. No acute intracranial abnormality. 2. Findings of chronic small vessel ischemia and volume loss.  NO NEW EXAMS   LABS REVIEWED  PREVIOUS   08-04-22: wbc 6.9; hgb 10.1; hct 31.8; mcv 97.5 plt 371; glucose 93; bun 15; creat 0.66; k+ 4.0; na++ 135; ca 8.8; gfr >60 mag 1.8; tsh 1.460; free t3: 1.8; free t4: 1.02 08-14-22: wbc 9.3; hgb 10.5; hct 31.8 mcv 94.9 plt 324; glucose 93; bun 15 creat 0.66; k+ 3.6; na++ 138; ca 9.1; gfr >60 08-23-22: wbc 9.1; hgb 10.2; hct 31.8; mcv 94.9 plt 379'; glucose 100; bun 12; creat 0.61; k+ 4.3; na++ 139; ca 9.0; gfr >60 10-11-22: glucose 87; bun 16; creat 0.59; k+ 3.7; na++ 137; ca 9.4; gfr >60 10-17-22: glucose 98; bun 17; creat 0.56; k+ 4.1; na++ 138; ca 9.4; gfr >60 11-14-22: hepatitis C nr 02-28-23: wbc 7.6; hgb 12.1; hct 375; mcv 100.5 plt 216   03-24-23: wbc 8.2; hgb 12.6; hct 39.5; mcv 101.1 plt 201; hgb A1c 5.4 chol 197; ldl 125; hdl 91; hdl 54  06-26-23: wbc 6.1; hgb 11.7; hct 36.2; mcv 98.4 plt 160; glucose 97; bun 14; creat 0.57; k+ 3.9; na++ 135; ca 8.7; gfr >60 d-dimer 0.28  CRP 3.0  NO NEW LABS.   Review of Systems  Constitutional:  Negative for malaise/fatigue.  Respiratory:  Negative for cough and shortness of breath.   Cardiovascular:  Negative for chest pain, palpitations and leg swelling.  Gastrointestinal:  Negative for abdominal pain, constipation and heartburn.  Musculoskeletal:  Negative for back pain, joint pain and myalgias.  Skin: Negative.   Neurological:  Negative for dizziness.  Psychiatric/Behavioral:  The patient is not nervous/anxious.     Physical Exam Constitutional:      General: She is not in acute distress.    Appearance: She is well-developed. She is not diaphoretic.  Neck:     Thyroid: No thyromegaly.  Cardiovascular:     Rate and Rhythm:  Normal rate and regular rhythm.     Pulses: Normal pulses.     Heart sounds: Normal heart sounds.  Pulmonary:     Effort: Pulmonary effort is normal. No respiratory distress.     Breath sounds: Normal breath sounds.  Abdominal:     General: Bowel sounds are normal. There is no distension.     Palpations:  Abdomen is soft.     Tenderness: There is no abdominal tenderness.  Musculoskeletal:        General: Normal range of motion.     Cervical back: Neck supple.     Right lower leg: No edema.     Left lower leg: No edema.  Lymphadenopathy:     Cervical: No cervical adenopathy.  Skin:    General: Skin is warm and dry.  Neurological:     Mental Status: She is alert. Mental status is at baseline.     Comments: Has abnormal facial movements      10-14-22: SLUMS 18/30  Psychiatric:        Mood and Affect: Mood normal.       ASSESSMENT/ PLAN:  TODAY  Longstanding persistent atrial fibrillation/history of DVT: heart rate is stable will continue eliquis 5 mg twice daily and is taking lopressor 25 mg twice daily for rate control  2. Migraine without status migrainosus, nonintractable unspecified type: will continueimitrx 100 mg twice daily as needed  3.  Uncomplicated asthma unspecified asthma severity unspecified: unspecified whether persistent: is on allegra 180 mg daily    PREVIOUS   4. Chronic constipation: will continue senna twice daily and has miralax daily as needed  5. Vascular dementia without behavioral disturbance/neurocognitive deficit: weight is 178 pounds; will continue aricept 10 mg nightly   6. Tardive dyskinesia: does continue to abnormal facial movements; failed ingrezza therapy. Will continue auestuda xr 30 mg daily if this is unsuccessful will need to consider tetrabenazine.   7. Chronic pain syndrome/periprosthetic fracture around internal prosthetic left knee joint sequela: is taking cymbalta 60 mg daily   8. Bilateral lower extremity edema: will continue lasix 20 mg daily   9. Chronic anemia: hgb 12.1  10. Chronic insomnia: will continue lunesta 1 mg nightly   11.  Psychosis in elderly/major depression with psychotic features: will continue abilify 10 mg daily cymbalta 60 mg daily buspar 30 mg twice daily  14. Somnolence: will continue provigil 100 mg  daily   15. Protein calorie malnutrition: will continue supplements as directed  16. Urinary incontinence: is off myrbetriq due to rash.    Synthia Innocent NP [facility]   call [phone]

## 2023-07-11 ENCOUNTER — Ambulatory Visit (INDEPENDENT_AMBULATORY_CARE_PROVIDER_SITE_OTHER): Payer: PPO | Admitting: Psychiatry

## 2023-07-11 ENCOUNTER — Encounter: Payer: Self-pay | Admitting: Psychiatry

## 2023-07-11 ENCOUNTER — Encounter: Payer: Self-pay | Admitting: Internal Medicine

## 2023-07-11 DIAGNOSIS — F3341 Major depressive disorder, recurrent, in partial remission: Secondary | ICD-10-CM

## 2023-07-11 DIAGNOSIS — G2119 Other drug induced secondary parkinsonism: Secondary | ICD-10-CM | POA: Diagnosis not present

## 2023-07-11 DIAGNOSIS — F5101 Primary insomnia: Secondary | ICD-10-CM | POA: Diagnosis not present

## 2023-07-11 DIAGNOSIS — F411 Generalized anxiety disorder: Secondary | ICD-10-CM | POA: Diagnosis not present

## 2023-07-11 DIAGNOSIS — T50905A Adverse effect of unspecified drugs, medicaments and biological substances, initial encounter: Secondary | ICD-10-CM | POA: Insufficient documentation

## 2023-07-11 NOTE — Progress Notes (Signed)
[name] [id number] 1946-08-31 77 y.o.  Subjective:   Patient ID:  [name] is a 77 y.o. (DOB 01/20/1946) female.  Chief Complaint:  Chief Complaint  Patient presents with   Medication Problem   Follow-up    Depression, anxiety, and insomnia    HPI [name] presents to the office today for follow-up of depression, anxiety, and insomnia. She is accompanied by her husband. She reports, "I have a tremor." She reports that it has been bothersome for the last 3-4 months and has been progressively worsening. Denies any other involuntary movements. She reports thatShe reports that movements worsen with anxiety. Tremor interferes with eating and writing. Austedo increased to 36 mg on 05/23/23.  She reports, "I'm not thinking as well." She reports that he often loses her train of thought. She reports some difficulty with concentration. Able to read some.   Vivid, disturbing dreams nightly. Falling asleep without difficulty with medication.   She reports that her mood is "pretty good considering where I am and all that is going on." She reports some situational sadness. She has been waiting since the start of July for a boot to help her walk. She reports that her anxiety has been ok other than wanting boot to arrive. Energy has been low. She reports, "I want to do something." She reports that she watches TV. She reports that she is gaining weight- "they feed [place] well." Denies SI.   Had COVID and was isolated.    Past medication trials: Celexa Pristiq Wellbutrin-hallucinations Effexor Prozac Zoloft Lexapro Nefazodone-effective and well-tolerated Cymbalta Abilify Latuda-Adverse reaction. Worsening depression Lamotrigine-Worsening mood and anxiety Nuedexta Trazodone Gabapentin- Disrupted sleep schedules, nightmares.  Vistaril Nuvigil Provigil Memantine Lunesta- Effective for insomnia  AIMS    Flowsheet Row Office Visit from 03/27/2023 in [facility] Office Visit from 02/11/2023 in [facility] Visit from 06/18/2022 in [facility] Office Visit from 05/02/2022 in [facility] Office Visit from 07/24/2021 in [facility]  AIMS Total Score 17 10 6 11  0      GAD-7    Flowsheet Row Office Visit from 01/30/2021 in [facility]  Total GAD-7 Score 1      Mini-Mental    Flowsheet Row Office Visit from 04/03/2022 in [facility]  Total Score (max 30 points ) 30      PHQ2-9    Flowsheet Row Nursing Home from 02/06/2023 in [facility] from 12/12/2022 in [facility] Nursing Home from 11/06/2022 in [facility] Office Visit from 01/30/2021 in [facility] for [facility] at [facility] Office Visit from 05/11/2019 in [facility]  PHQ-2 Total Score 0 0 1 2 1   PHQ-9 Total Score 1 0 3 6 --      Flowsheet Row Pre-Admission Testing 61 from 10/11/2022 in [facility] Admission (Discharged) from 08/14/2022 in [facility] Admission (Discharged) from 07/22/2022 in [place] [facility]  C-SSRS RISK CATEGORY No Risk No Risk No Risk        Review of Systems:  Review of Systems  Musculoskeletal:  Positive for gait problem.  Neurological:  Positive for tremors.  Psychiatric/Behavioral:  Please refer to HPI    Medications: I have reviewed the patient's current medications.  Current Outpatient Medications  Medication Sig Dispense Refill   apixaban (ELIQUIS) 5 MG TABS tablet Take 1 tablet (5 mg total) by mouth 2 (two) times daily. 60 tablet    ARIPiprazole (ABILIFY) 10 MG tablet Take 10 mg by mouth daily.      busPIRone (BUSPAR) 30 MG tablet Take 1 tablet (30 mg total) by mouth 2 (two) times daily. 180 tablet 0   donepezil (ARICEPT) 10 MG tablet Take 10 mg by mouth at bedtime.     DULoxetine (CYMBALTA) 60 MG capsule Take 60 mg by mouth daily.     eszopiclone (LUNESTA) 1 MG TABS tablet Take 1 tablet (1 mg total) by mouth at bedtime. Take immediately before bedtime 30 tablet 0   fexofenadine (ALLEGRA) 180 MG tablet Take 180 mg by mouth daily.     furosemide (LASIX) 20 MG tablet Take 20 mg by mouth.     metoprolol tartrate (LOPRESSOR) 25 MG tablet Take 1 tablet (25 mg total) by mouth 2 (two) times daily. 180 tablet 3   modafinil (PROVIGIL) 200 MG tablet Take 0.5 tablets (100 mg total) by mouth daily. 15 tablet 0   rosuvastatin (CRESTOR) 5 MG tablet Take 5 mg by mouth at bedtime.     senna (SENOKOT) 8.6 MG TABS tablet Take 1 tablet (8.6 mg total) by mouth 2 (two) times daily. 120 tablet 0   SUMAtriptan (IMITREX) 100 MG tablet Take 100 mg by mouth every 2 (two) hours as needed for migraine or headache. May repeat in 2 hours if headache persists or recurs.     acetaminophen (TYLENOL) 650 MG CR tablet Take 650 mg by mouth at bedtime.     Deutetrabenazine (AUSTEDO) 6 MG TABS Take 12 mg by mouth daily at 2 PM. For total dose of 36 mg daily     Deutetrabenazine ER (AUSTEDO XR) 24 MG TB24 Take 25 mg by mouth daily at 2 PM.     Magnesium Hydroxide (MILK OF MAGNESIA PO) Take 30 mLs by mouth as needed (constipation).     Omeprazole 20 MG TBDD Take 20 mg by mouth daily as needed. (Patient not taking: Reported on 07/11/2023) 30 tablet 3   polyethylene glycol (MIRALAX / GLYCOLAX) 17 g packet Take 17 g by mouth daily as needed. (Patient not taking: Reported on 07/11/2023) 14 each 0   UNABLE TO FIND Diet: Regular     Vitamins A & D (VITAMIN A & D) ointment Apply 1 Application topically 2 (two) times daily. To left heel     ZINC OXIDE EX Apply 1 Application topically See admin instructions. every shift and prn Special  Instructions: Apply to sacrum, bilateral buttocks and coccyx q shift for redness.     No current facility-administered medications for this visit.    Medication Side Effects: Other: Possible Drug-induced Parkinsonism  with higher dose of Abilify  Allergies:  Allergies  Allergen Reactions   Ticlid [Ticlopidine] Shortness Of Breath   Nsaids Other (See Comments)    Bleeding ulcer   Prednisone Other (See Comments)    Makes her stomach hurt--knows this isn't an allergy but doesn't want to take it    Trazodone And Nefazodone     hjallucinations   Monistat [Miconazole] Swelling and Rash   Myrbetriq [Mirabegron Er] Rash    See 02/27/2023   Sulfa Antibiotics Rash    Past Medical History:  Diagnosis Date   Anemia  Arthritis    Asthma    Atrial fibrillation (HCC)    COVID-19 08/05/2022   Depression    H/O head injury    Hearing loss    Heart murmur    can be heard at times, Doctor said not to worry   History of kidney stones    History of stomach ulcers    Migraine    Pneumonia    Psittacosis    Stroke (HCC)    Weakness of neck 03/02/2013    Past Medical History, Surgical history, Social history, and Family history were reviewed and updated as appropriate.   Please see review of systems for further details on the patient's review from today.   Objective:   Physical Exam:  There were no vitals taken for this visit.  Physical Exam Constitutional:      General: She is not in acute distress. Musculoskeletal:        General: No deformity.  Neurological:     Mental Status: She is alert and oriented to person, place, and time.     Coordination: Coordination normal.  Psychiatric:        Attention and Perception: Attention and perception normal. She does not perceive auditory or visual hallucinations.        Mood and Affect: Mood normal. Mood is not anxious or depressed. Affect is not labile, blunt, angry or inappropriate.        Speech: Speech normal.        Behavior:  Behavior normal. Behavior is cooperative.        Thought Content: Thought content normal. Thought content is not paranoid or delusional. Thought content does not include homicidal or suicidal ideation. Thought content does not include homicidal or suicidal plan.        Cognition and Memory: Cognition normal. She exhibits impaired recent memory.        Judgment: Judgment normal.     Comments: Insight intact     Lab Review:     Component Value Date/Time   NA 135 06/26/2023 0800   NA 140 05/27/2017 1553   K 3.9 06/26/2023 0800   CL 98 06/26/2023 0800   CO2 30 06/26/2023 0800   GLUCOSE 97 06/26/2023 0800   BUN 14 06/26/2023 0800   BUN 15 05/27/2017 1553   CREATININE 0.57 06/26/2023 0800   CALCIUM 8.7 (L) 06/26/2023 0800   PROT 7.0 07/18/2022 1459   PROT 7.4 05/27/2017 1553   ALBUMIN 4.1 07/18/2022 1459   ALBUMIN 4.5 05/27/2017 1553   AST 24 07/18/2022 1459   ALT 15 07/18/2022 1459   ALKPHOS 50 07/18/2022 1459   BILITOT 0.5 07/18/2022 1459   BILITOT 0.4 05/27/2017 1553   GFRNONAA >60 06/26/2023 0800   GFRAA 104 05/27/2017 1553       Component Value Date/Time   WBC 6.1 06/26/2023 0800   RBC 3.68 (L) 06/26/2023 0800   HGB 11.7 (L) 06/26/2023 0800   HCT 36.2 06/26/2023 0800   PLT 160 06/26/2023 0800   MCV 98.4 06/26/2023 0800   MCH 31.8 06/26/2023 0800   MCHC 32.3 06/26/2023 0800   RDW 12.6 06/26/2023 0800   LYMPHSABS 1.0 02/28/2023 0800   MONOABS 0.3 02/28/2023 0800   EOSABS 0.0 02/28/2023 0800   BASOSABS 0.0 02/28/2023 0800    No results found for: "POCLITH", "LITHIUM"   No results found for: "PHENYTOIN", "PHENOBARB", "VALPROATE", "CBMZ"   .res Assessment: Plan:    50 minutes spent dedicated to the care of this patient on  the date of this encounter to include pre-visit review of records, ordering of medication, post visit documentation, and face-to-face time with the patient and her husband discussing possible drug-induced Parkinsonism from increased dose of  Austedo XR. Recommend decreasing dose of Austedo XR to 30 mg daily to minimize Parkinsonism. Continue Abilify 10 mg daily for mood disorder Continue Aricept 10 mg daily for dementia.  Continue Cymbalta 60 mg daily for anxiety and depression.  Continue Lunesta 1 mg at bedtime for insomnia.  Continue Modafinil 100 mg daily for concentration, energy, and motivation. Continue Buspar 30 mg po BID for anxiety.  Pt to follow-up in 2 months or sooner if clinically indicated.  Patient advised to contact office with any questions, adverse effects, or acute worsening in signs and symptoms.   Matisen was seen today for medication problem and follow-up.  Diagnoses and all orders for this visit:  Generalized anxiety disorder  Other drug-induced secondary parkinsonism (HCC)  Recurrent major depressive disorder, in partial remission (HCC)  Primary insomnia     Please see After Visit Summary for patient specific instructions.  Future Appointments  Date Time Provider Department Center  09/10/2023 12:45 PM Corie Chiquito, PMHNP [facility] None  10/06/2023  9:20 AM Mallipeddi, Orion Modest, MD [facility]    No orders of the defined types were placed in this encounter.   -------------------------------

## 2023-07-15 DIAGNOSIS — M6281 Muscle weakness (generalized): Secondary | ICD-10-CM | POA: Diagnosis not present

## 2023-07-15 DIAGNOSIS — M24574 Contracture, right foot: Secondary | ICD-10-CM | POA: Diagnosis not present

## 2023-07-15 DIAGNOSIS — M21761 Unequal limb length (acquired), right tibia: Secondary | ICD-10-CM | POA: Diagnosis not present

## 2023-07-15 DIAGNOSIS — M159 Polyosteoarthritis, unspecified: Secondary | ICD-10-CM | POA: Diagnosis not present

## 2023-07-21 ENCOUNTER — Encounter: Payer: Self-pay | Admitting: Adult Health

## 2023-07-21 ENCOUNTER — Other Ambulatory Visit: Payer: Self-pay | Admitting: Adult Health

## 2023-07-21 ENCOUNTER — Non-Acute Institutional Stay (SKILLED_NURSING_FACILITY): Payer: Self-pay | Admitting: Adult Health

## 2023-07-21 DIAGNOSIS — R29818 Other symptoms and signs involving the nervous system: Secondary | ICD-10-CM

## 2023-07-21 DIAGNOSIS — R4189 Other symptoms and signs involving cognitive functions and awareness: Secondary | ICD-10-CM

## 2023-07-21 DIAGNOSIS — K5909 Other constipation: Secondary | ICD-10-CM | POA: Diagnosis not present

## 2023-07-21 DIAGNOSIS — F0150 Vascular dementia without behavioral disturbance: Secondary | ICD-10-CM | POA: Diagnosis not present

## 2023-07-21 DIAGNOSIS — G2401 Drug induced subacute dyskinesia: Secondary | ICD-10-CM | POA: Diagnosis not present

## 2023-07-21 MED ORDER — MODAFINIL 200 MG PO TABS: 100.0000 mg | ORAL_TABLET | Freq: Every day | ORAL | 0 refills | Status: DC

## 2023-07-21 NOTE — Progress Notes (Unsigned)
Location:  [facility] Room Number: 101 Place of Service:  SNF (31)   CODE STATUS: full   Allergies  Allergen Reactions   Ticlid [Ticlopidine] Shortness Of Breath   Nsaids Other (See Comments)    Bleeding ulcer   Prednisone Other (See Comments)    Makes her stomach hurt--knows this isn't an allergy but doesn't want to take it    Trazodone And Nefazodone     hjallucinations   Monistat [Miconazole] Swelling and Rash   Myrbetriq Theodosia Paling Er] Rash    See 02/27/2023   Sulfa Antibiotics Rash    Chief Complaint  Patient presents with   Medical Management of Chronic Issues         Chronic constipation:      Vascular dementia without behavioral disturbance/neurocognitive deficit:   Tardive dyskinesia:     HPI:  She is a 77 year old long term resident of this facility being seen for the management of her chronic illnesses: Chronic constipation:      Vascular dementia without behavioral disturbance/neurocognitive deficit:   Tardive dyskinesia.  She continues to have abnormal movement of face and hands. Her appetite is good. She does have some left knee pain; which is managed at this time.   Past Medical History:  Diagnosis Date   Anemia    Arthritis    Asthma    Atrial fibrillation (HCC)    COVID-19 08/05/2022   Depression    H/O head injury    Hearing loss    Heart murmur    can be heard at times, Doctor said not to worry   History of kidney stones    History of stomach ulcers    Migraine    Pneumonia    Psittacosis    Stroke (HCC)    Weakness of neck 03/02/2013    Past Surgical History:  Procedure Laterality Date   ANKLE FRACTURE SURGERY Left    APPENDECTOMY     BREAST LUMPECTOMY     CESAREAN SECTION     x 3   I & D KNEE WITH POLY EXCHANGE Left 08/16/2022   Procedure: KNEE POLY EXCHANGE;  Surgeon: Bjorn Pippin, MD;  Location: [facility] OR;  Service: Orthopedics;  Laterality: Left;   KNEE ARTHROSCOPY WITH PATELLAR TENDON REPAIR Left 08/16/2022    Procedure: IRRIGATION AND DEBRIDEMENT LEFT KNEE REVISION WITH PATELLAR TENDON REPAIR and poly exchange;  Surgeon: Bjorn Pippin, MD;  Location: [facility] OR;  Service: Orthopedics;  Laterality: Left;   PARTIAL HYSTERECTOMY     ROTATOR CUFF REPAIR Right    TOTAL KNEE ARTHROPLASTY Left 07/22/2022   Procedure: TOTAL KNEE ARTHROPLASTY;  Surgeon: Joen Laura, MD;  Location: [facility] ORS;  Service: Orthopedics;  Laterality: Left;    Social History   Socioeconomic History   Marital status: Married    Spouse name: Not on file   Number of children: 3   Years of education: Not on file   Highest education level: Not on file  Occupational History   Occupation: retired  Tobacco Use   Smoking status: Never   Smokeless tobacco: Never  Vaping Use   Vaping status: Never Used  Substance and Sexual Activity   Alcohol use: Not Currently    Comment: occ beer   Drug use: No   Sexual activity: Yes    Birth control/protection: Surgical    Comment: hyst  Other Topics Concern   Not on file  Social History Narrative   Not on file  Social Determinants of Health   Financial Resource Strain: Medium Risk (01/30/2021)   Overall Financial Resource Strain (CARDIA)    Difficulty of Paying Living Expenses: Somewhat hard  Food Insecurity: No Food Insecurity (08/15/2022)   Hunger Vital Sign    Worried About Running Out of Food in the Last Year: Never true    Ran Out of Food in the Last Year: Never true  Transportation Needs: No Transportation Needs (08/15/2022)   PRAPARE - Administrator, Civil Service (Medical): No    Lack of Transportation (Non-Medical): No  Physical Activity: Insufficiently Active (01/30/2021)   Exercise Vital Sign    Days of Exercise per Week: 2 days    Minutes of Exercise per Session: 10 min  Stress: No Stress Concern Present (01/30/2021)   [employer] of Occupational Health - Occupational Stress Questionnaire    Feeling of Stress : Only a little  Social Connections:  Moderately Integrated (01/30/2021)   Social Connection and Isolation Panel [NHANES]    Frequency of Communication with Friends and Family: Twice a week    Frequency of Social Gatherings with Friends and Family: Twice a week    Attends Religious Services: More than 4 times per year    Active Member of [employer] or Organizations: No    Attends Banker Meetings: Never    Marital Status: Married  Catering manager Violence: Not At Risk (08/15/2022)   Humiliation, Afraid, Rape, and Kick questionnaire    Fear of Current or Ex-Partner: No    Emotionally Abused: No    Physically Abused: No    Sexually Abused: No   Family History  Problem Relation Age of Onset   Cancer Father    Heart disease Brother    Alcohol abuse Brother    Depression Maternal Grandmother    Depression Grandchild    Breast cancer Daughter       VITAL SIGNS BP 108/69   Pulse 77   Temp (!) 97.3 F (36.3 C)   Resp 20   Ht 5\' 5"  (1.651 m)   Wt 175 lb 3.2 oz (79.5 kg)   SpO2 95%   BMI 29.15 kg/m   Outpatient Encounter Medications as of 07/21/2023  Medication Sig   Deutetrabenazine ER (AUSTEDO XR) 30 MG TB24 Take 30 mg by mouth daily at 2 PM.   acetaminophen (TYLENOL) 650 MG CR tablet Take 650 mg by mouth at bedtime.   apixaban (ELIQUIS) 5 MG TABS tablet Take 1 tablet (5 mg total) by mouth 2 (two) times daily.   ARIPiprazole (ABILIFY) 10 MG tablet Take 10 mg by mouth daily.   busPIRone (BUSPAR) 30 MG tablet Take 1 tablet (30 mg total) by mouth 2 (two) times daily.   donepezil (ARICEPT) 10 MG tablet Take 10 mg by mouth at bedtime.   DULoxetine (CYMBALTA) 60 MG capsule Take 60 mg by mouth daily.   eszopiclone (LUNESTA) 1 MG TABS tablet Take 1 tablet (1 mg total) by mouth at bedtime. Take immediately before bedtime   fexofenadine (ALLEGRA) 180 MG tablet Take 180 mg by mouth daily.   furosemide (LASIX) 20 MG tablet Take 20 mg by mouth.   Magnesium Hydroxide (MILK OF MAGNESIA PO) Take 30 mLs by mouth as needed  (constipation).   metoprolol tartrate (LOPRESSOR) 25 MG tablet Take 1 tablet (25 mg total) by mouth 2 (two) times daily.   modafinil (PROVIGIL) 200 MG tablet Take 0.5 tablets (100 mg total) by mouth daily.   rosuvastatin (CRESTOR) 5  MG tablet Take 5 mg by mouth at bedtime.   senna (SENOKOT) 8.6 MG TABS tablet Take 1 tablet (8.6 mg total) by mouth 2 (two) times daily.   SUMAtriptan (IMITREX) 100 MG tablet Take 100 mg by mouth every 2 (two) hours as needed for migraine or headache. May repeat in 2 hours if headache persists or recurs.   UNABLE TO FIND Diet: Regular   Vitamins A & D (VITAMIN A & D) ointment Apply 1 Application topically 2 (two) times daily. To left heel   ZINC OXIDE EX Apply 1 Application topically See admin instructions. every shift and prn Special Instructions: Apply to sacrum, bilateral buttocks and coccyx q shift for redness.   [DISCONTINUED] Deutetrabenazine (AUSTEDO) 6 MG TABS Take 12 mg by mouth daily at 2 PM. For total dose of 36 mg daily   [DISCONTINUED] Deutetrabenazine ER (AUSTEDO XR) 24 MG TB24 Take 25 mg by mouth daily at 2 PM.   [DISCONTINUED] Omeprazole 20 MG TBDD Take 20 mg by mouth daily as needed. (Patient not taking: Reported on 07/11/2023)   [DISCONTINUED] polyethylene glycol (MIRALAX / GLYCOLAX) 17 g packet Take 17 g by mouth daily as needed. (Patient not taking: Reported on 07/11/2023)   No facility-administered encounter medications on file as of 07/21/2023.     SIGNIFICANT DIAGNOSTIC EXAMS  PREVIOUS   08-16-22: chest x-ray: 1. No acute intrathoracic process.   08-21-22: ct of head:  1. Possible sulcal effacement and loss of gray-white differentiation in the posterior right frontal lobe may represent subacute infarct versus artifact. Consider further evaluation with MRI. 2. Mild chronic small vessel ischemia.  08-22-22: MRI head:  1. No acute intracranial abnormality. 2. Findings of chronic small vessel ischemia and volume loss.  NO NEW EXAMS   LABS  REVIEWED PREVIOUS   08-04-22: wbc 6.9; hgb 10.1; hct 31.8; mcv 97.5 plt 371; glucose 93; bun 15; creat 0.66; k+ 4.0; na++ 135; ca 8.8; gfr >60 mag 1.8; tsh 1.460; free t3: 1.8; free t4: 1.02 08-14-22: wbc 9.3; hgb 10.5; hct 31.8 mcv 94.9 plt 324; glucose 93; bun 15 creat 0.66; k+ 3.6; na++ 138; ca 9.1; gfr >60 08-23-22: wbc 9.1; hgb 10.2; hct 31.8; mcv 94.9 plt 379'; glucose 100; bun 12; creat 0.61; k+ 4.3; na++ 139; ca 9.0; gfr >60 10-11-22: glucose 87; bun 16; creat 0.59; k+ 3.7; na++ 137; ca 9.4; gfr >60 10-17-22: glucose 98; bun 17; creat 0.56; k+ 4.1; na++ 138; ca 9.4; gfr >60 11-14-22: hepatitis C nr 02-28-23: wbc 7.6; hgb 12.1; hct 375; mcv 100.5 plt 216   03-24-23: wbc 8.2; hgb 12.6; hct 39.5; mcv 101.1 plt 201; hgb A1c 5.4 chol 197; ldl 125; hdl 91; hdl 54  06-26-23: wbc 6.1; hgb 11.7; hct 36.2; mcv 98.4 plt 160; glucose 97; bun 14; creat 0.57; k+ 3.9; na++ 135; ca 8.7; gfr >60 d-dimer 0.28  CRP 3.0  NO NEW LABS.   Review of Systems  Constitutional:  Negative for malaise/fatigue.  Respiratory:  Negative for cough and shortness of breath.   Cardiovascular:  Negative for chest pain, palpitations and leg swelling.  Gastrointestinal:  Negative for abdominal pain, constipation and heartburn.  Musculoskeletal:  Negative for back pain, joint pain and myalgias.  Skin: Negative.   Neurological:  Negative for dizziness.  Psychiatric/Behavioral:  The patient is not nervous/anxious.    Physical Exam Constitutional:      General: She is not in acute distress.    Appearance: She is well-developed. She is not diaphoretic.  Neck:     Thyroid: No thyromegaly.  Cardiovascular:     Rate and Rhythm: Normal rate and regular rhythm.     Pulses: Normal pulses.     Heart sounds: Murmur heard.  Pulmonary:     Effort: Pulmonary effort is normal. No respiratory distress.     Breath sounds: Normal breath sounds.  Abdominal:     General: Bowel sounds are normal. There is no distension.     Palpations:  Abdomen is soft.     Tenderness: There is no abdominal tenderness.  Musculoskeletal:        General: Normal range of motion.     Cervical back: Neck supple.     Right lower leg: No edema.     Left lower leg: No edema.  Lymphadenopathy:     Cervical: No cervical adenopathy.  Skin:    General: Skin is warm and dry.  Neurological:     Mental Status: She is alert. Mental status is at baseline.     Comments: Has abnormal facial movements and hand tremors      10-14-22: SLUMS 18/30  Psychiatric:        Mood and Affect: Mood normal.     ASSESSMENT/ PLAN:  TODAY  Chronic constipation: will continue senna s twice daily   2. Vascular dementia without behavioral disturbance/neurocognitive deficit: weight is 175 pounds; will continue aricept 10 mg daily   3. Tardive dyskinesia: does continue to have abnormal facial and hand movements; has failed ingrezza therapy; will continue auestuda xr 30 mg daily if this is unsuccessful will continue tetrabenazine.   PREVIOUS   4. Chronic pain syndrome/periprosthetic fracture around internal prosthetic left knee joint sequela: is taking cymbalta 60 mg daily and tylenol 650 mg nightly   5. Bilateral lower extremity edema: will continue lasix 20 mg daily   6. Chronic anemia: hgb 12.1  7. Chronic insomnia: will continue lunesta 1 mg nightly   8.  Psychosis in elderly/major depression with psychotic features: will continue abilify 10 mg daily cymbalta 60 mg daily buspar 30 mg twice daily  9. Somnolence: will continue provigil 100 mg daily   10. Protein calorie malnutrition: will continue supplements as directed  11. Urinary incontinence: is off myrbetriq due to rash.   12. Longstanding persistent atrial fibrillation/history of DVT: heart rate is stable will continue eliquis 5 mg twice daily and is taking lopressor 25 mg twice daily for rate control  13. Migraine without status migrainosus, nonintractable unspecified type: will continueimitrx 100  mg twice daily as needed  14.  Uncomplicated asthma unspecified asthma severity unspecified: unspecified whether persistent: is on allegra 180 mg daily      Synthia Innocent NP [facility]   call [phone]

## 2023-07-24 DIAGNOSIS — G2401 Drug induced subacute dyskinesia: Secondary | ICD-10-CM | POA: Diagnosis not present

## 2023-07-24 DIAGNOSIS — F323 Major depressive disorder, single episode, severe with psychotic features: Secondary | ICD-10-CM | POA: Diagnosis not present

## 2023-07-24 DIAGNOSIS — S8002XA Contusion of left knee, initial encounter: Secondary | ICD-10-CM | POA: Diagnosis not present

## 2023-07-24 DIAGNOSIS — F0150 Vascular dementia without behavioral disturbance: Secondary | ICD-10-CM | POA: Diagnosis not present

## 2023-07-24 DIAGNOSIS — F01B3 Vascular dementia, moderate, with mood disturbance: Secondary | ICD-10-CM | POA: Diagnosis not present

## 2023-07-24 DIAGNOSIS — G20C Parkinsonism, unspecified: Secondary | ICD-10-CM | POA: Diagnosis not present

## 2023-07-24 DIAGNOSIS — F419 Anxiety disorder, unspecified: Secondary | ICD-10-CM | POA: Diagnosis not present

## 2023-07-28 ENCOUNTER — Non-Acute Institutional Stay (SKILLED_NURSING_FACILITY): Payer: Self-pay | Admitting: Adult Health

## 2023-07-28 ENCOUNTER — Encounter: Payer: Self-pay | Admitting: Adult Health

## 2023-07-28 DIAGNOSIS — M1990 Unspecified osteoarthritis, unspecified site: Secondary | ICD-10-CM | POA: Diagnosis not present

## 2023-07-28 NOTE — Progress Notes (Unsigned)
Location:  [facility] Room Number: 101 Place of Service:  SNF (31)   CODE STATUS: full   Allergies  Allergen Reactions   Ticlid [Ticlopidine] Shortness Of Breath   Nsaids Other (See Comments)    Bleeding ulcer   Prednisone Other (See Comments)    Makes her stomach hurt--knows this isn't an allergy but doesn't want to take it    Trazodone And Nefazodone     hjallucinations   Monistat [Miconazole] Swelling and Rash   Myrbetriq Theodosia Paling Er] Rash    See 02/27/2023   Sulfa Antibiotics Rash    Chief Complaint  Patient presents with   Acute Visit    Right knee and foot pain     HPI:  She is having right knee and foot pain. She states that the pain is achy in nature and is consistent. She denies any difficulty with sleeping at this time. She is presently taking tylenol daily for pain management.   Past Medical History:  Diagnosis Date   Anemia    Arthritis    Asthma    Atrial fibrillation (HCC)    COVID-19 08/05/2022   Depression    H/O head injury    Hearing loss    Heart murmur    can be heard at times, Doctor said not to worry   History of kidney stones    History of stomach ulcers    Migraine    Pneumonia    Psittacosis    Stroke (HCC)    Weakness of neck 03/02/2013    Past Surgical History:  Procedure Laterality Date   ANKLE FRACTURE SURGERY Left    APPENDECTOMY     BREAST LUMPECTOMY     CESAREAN SECTION     x 3   I & D KNEE WITH POLY EXCHANGE Left 08/16/2022   Procedure: KNEE POLY EXCHANGE;  Surgeon: Bjorn Pippin, MD;  Location: [facility] OR;  Service: Orthopedics;  Laterality: Left;   KNEE ARTHROSCOPY WITH PATELLAR TENDON REPAIR Left 08/16/2022   Procedure: IRRIGATION AND DEBRIDEMENT LEFT KNEE REVISION WITH PATELLAR TENDON REPAIR and poly exchange;  Surgeon: Bjorn Pippin, MD;  Location: [facility] OR;  Service: Orthopedics;  Laterality: Left;   PARTIAL HYSTERECTOMY     ROTATOR CUFF REPAIR Right    TOTAL KNEE ARTHROPLASTY Left 07/22/2022    Procedure: TOTAL KNEE ARTHROPLASTY;  Surgeon: Joen Laura, MD;  Location: [facility] ORS;  Service: Orthopedics;  Laterality: Left;    Social History   Socioeconomic History   Marital status: Married    Spouse name: Not on file   Number of children: 3   Years of education: Not on file   Highest education level: Not on file  Occupational History   Occupation: retired  Tobacco Use   Smoking status: Never   Smokeless tobacco: Never  Vaping Use   Vaping status: Never Used  Substance and Sexual Activity   Alcohol use: Not Currently    Comment: occ beer   Drug use: No   Sexual activity: Yes    Birth control/protection: Surgical    Comment: hyst  Other Topics Concern   Not on file  Social History Narrative   Not on file   Social Determinants of Health   Financial Resource Strain: Medium Risk (01/30/2021)   Overall Financial Resource Strain (CARDIA)    Difficulty of Paying Living Expenses: Somewhat hard  Food Insecurity: No Food Insecurity (08/15/2022)   Hunger Vital Sign    Worried About  Running Out of Food in the Last Year: Never true    Ran Out of Food in the Last Year: Never true  Transportation Needs: No Transportation Needs (08/15/2022)   PRAPARE - Administrator, Civil Service (Medical): No    Lack of Transportation (Non-Medical): No  Physical Activity: Insufficiently Active (01/30/2021)   Exercise Vital Sign    Days of Exercise per Week: 2 days    Minutes of Exercise per Session: 10 min  Stress: No Stress Concern Present (01/30/2021)   [employer] of Occupational Health - Occupational Stress Questionnaire    Feeling of Stress : Only a little  Social Connections: Moderately Integrated (01/30/2021)   Social Connection and Isolation Panel [NHANES]    Frequency of Communication with Friends and Family: Twice a week    Frequency of Social Gatherings with Friends and Family: Twice a week    Attends Religious Services: More than 4 times per year    Active  Member of [employer] or Organizations: No    Attends Banker Meetings: Never    Marital Status: Married  Catering manager Violence: Not At Risk (08/15/2022)   Humiliation, Afraid, Rape, and Kick questionnaire    Fear of Current or Ex-Partner: No    Emotionally Abused: No    Physically Abused: No    Sexually Abused: No   Family History  Problem Relation Age of Onset   Cancer Father    Heart disease Brother    Alcohol abuse Brother    Depression Maternal Grandmother    Depression Grandchild    Breast cancer Daughter       VITAL SIGNS BP (!) 145/70   Pulse 72   Temp 97.8 F (36.6 C)   Ht 5\' 5"  (1.651 m)   Wt 175 lb 3.2 oz (79.5 kg)   BMI 29.15 kg/m   Outpatient Encounter Medications as of 07/28/2023  Medication Sig   acetaminophen (TYLENOL) 650 MG CR tablet Take 650 mg by mouth at bedtime.   apixaban (ELIQUIS) 5 MG TABS tablet Take 1 tablet (5 mg total) by mouth 2 (two) times daily.   ARIPiprazole (ABILIFY) 10 MG tablet Take 10 mg by mouth daily.   busPIRone (BUSPAR) 30 MG tablet Take 1 tablet (30 mg total) by mouth 2 (two) times daily.   Deutetrabenazine ER (AUSTEDO XR) 30 MG TB24 Take 30 mg by mouth daily at 2 PM.   donepezil (ARICEPT) 10 MG tablet Take 10 mg by mouth at bedtime.   DULoxetine (CYMBALTA) 60 MG capsule Take 60 mg by mouth daily.   eszopiclone (LUNESTA) 1 MG TABS tablet Take 1 tablet (1 mg total) by mouth at bedtime. Take immediately before bedtime   fexofenadine (ALLEGRA) 180 MG tablet Take 180 mg by mouth daily.   furosemide (LASIX) 20 MG tablet Take 20 mg by mouth.   Magnesium Hydroxide (MILK OF MAGNESIA PO) Take 30 mLs by mouth as needed (constipation).   metoprolol tartrate (LOPRESSOR) 25 MG tablet Take 1 tablet (25 mg total) by mouth 2 (two) times daily.   modafinil (PROVIGIL) 200 MG tablet Take 0.5 tablets (100 mg total) by mouth daily.   rosuvastatin (CRESTOR) 5 MG tablet Take 5 mg by mouth at bedtime.   senna (SENOKOT) 8.6 MG TABS tablet  Take 1 tablet (8.6 mg total) by mouth 2 (two) times daily.   SUMAtriptan (IMITREX) 100 MG tablet Take 100 mg by mouth every 2 (two) hours as needed for migraine or headache. May repeat in  2 hours if headache persists or recurs.   UNABLE TO FIND Diet: Regular   Vitamins A & D (VITAMIN A & D) ointment Apply 1 Application topically 2 (two) times daily. To left heel   ZINC OXIDE EX Apply 1 Application topically See admin instructions. every shift and prn Special Instructions: Apply to sacrum, bilateral buttocks and coccyx q shift for redness.   No facility-administered encounter medications on file as of 07/28/2023.     SIGNIFICANT DIAGNOSTIC EXAMS  PREVIOUS   08-16-22: chest x-ray: 1. No acute intrathoracic process.   08-21-22: ct of head:  1. Possible sulcal effacement and loss of gray-white differentiation in the posterior right frontal lobe may represent subacute infarct versus artifact. Consider further evaluation with MRI. 2. Mild chronic small vessel ischemia.  08-22-22: MRI head:  1. No acute intracranial abnormality. 2. Findings of chronic small vessel ischemia and volume loss.  NO NEW EXAMS   LABS REVIEWED PREVIOUS   08-04-22: wbc 6.9; hgb 10.1; hct 31.8; mcv 97.5 plt 371; glucose 93; bun 15; creat 0.66; k+ 4.0; na++ 135; ca 8.8; gfr >60 mag 1.8; tsh 1.460; free t3: 1.8; free t4: 1.02 08-14-22: wbc 9.3; hgb 10.5; hct 31.8 mcv 94.9 plt 324; glucose 93; bun 15 creat 0.66; k+ 3.6; na++ 138; ca 9.1; gfr >60 08-23-22: wbc 9.1; hgb 10.2; hct 31.8; mcv 94.9 plt 379'; glucose 100; bun 12; creat 0.61; k+ 4.3; na++ 139; ca 9.0; gfr >60 10-11-22: glucose 87; bun 16; creat 0.59; k+ 3.7; na++ 137; ca 9.4; gfr >60 10-17-22: glucose 98; bun 17; creat 0.56; k+ 4.1; na++ 138; ca 9.4; gfr >60 11-14-22: hepatitis C nr 02-28-23: wbc 7.6; hgb 12.1; hct 375; mcv 100.5 plt 216   03-24-23: wbc 8.2; hgb 12.6; hct 39.5; mcv 101.1 plt 201; hgb A1c 5.4 chol 197; ldl 125; hdl 91; hdl 54  06-26-23: wbc 6.1; hgb  11.7; hct 36.2; mcv 98.4 plt 160; glucose 97; bun 14; creat 0.57; k+ 3.9; na++ 135; ca 8.7; gfr >60 d-dimer 0.28  CRP 3.0  NO NEW LABS.   Review of Systems  Constitutional:  Negative for malaise/fatigue.  Respiratory:  Negative for cough and shortness of breath.   Cardiovascular:  Negative for chest pain, palpitations and leg swelling.  Gastrointestinal:  Negative for abdominal pain, constipation and heartburn.  Musculoskeletal:  Positive for joint pain. Negative for back pain and myalgias.       Has right knee and foot pain   Skin: Negative.   Neurological:  Negative for dizziness.  Psychiatric/Behavioral:  The patient is not nervous/anxious.     Physical Exam Constitutional:      General: She is not in acute distress.    Appearance: She is well-developed. She is not diaphoretic.  Neck:     Thyroid: No thyromegaly.  Cardiovascular:     Rate and Rhythm: Normal rate and regular rhythm.     Pulses: Normal pulses.     Heart sounds: Murmur heard.  Pulmonary:     Effort: Pulmonary effort is normal. No respiratory distress.     Breath sounds: Normal breath sounds.  Abdominal:     General: Bowel sounds are normal. There is no distension.     Palpations: Abdomen is soft.     Tenderness: There is no abdominal tenderness.  Musculoskeletal:        General: Normal range of motion.     Cervical back: Neck supple.     Right lower leg: No edema.  Left lower leg: No edema.  Lymphadenopathy:     Cervical: No cervical adenopathy.  Skin:    General: Skin is warm and dry.  Neurological:     Mental Status: She is alert. Mental status is at baseline.     Comments: Has abnormal facial movements and hand tremors      10-14-22: SLUMS 18/30   Psychiatric:        Mood and Affect: Mood normal.       ASSESSMENT/ PLAN:  TODAY  Arthritis: her pain is worse; will change to tylenol cr 605 mg every 8 hours.    Synthia Innocent NP [facility]  call [phone]

## 2023-08-05 ENCOUNTER — Other Ambulatory Visit: Payer: Self-pay | Admitting: Adult Health

## 2023-08-05 MED ORDER — ESZOPICLONE 1 MG PO TABS: 1.0000 mg | ORAL_TABLET | Freq: Every day | ORAL | 0 refills | Status: DC

## 2023-08-07 ENCOUNTER — Other Ambulatory Visit: Payer: Self-pay | Admitting: Adult Health

## 2023-08-12 ENCOUNTER — Non-Acute Institutional Stay (SKILLED_NURSING_FACILITY): Payer: Self-pay | Admitting: Internal Medicine

## 2023-08-12 ENCOUNTER — Encounter: Payer: Self-pay | Admitting: Internal Medicine

## 2023-08-12 DIAGNOSIS — J3089 Other allergic rhinitis: Secondary | ICD-10-CM

## 2023-08-12 DIAGNOSIS — E785 Hyperlipidemia, unspecified: Secondary | ICD-10-CM

## 2023-08-12 DIAGNOSIS — I4811 Longstanding persistent atrial fibrillation: Secondary | ICD-10-CM

## 2023-08-12 NOTE — Assessment & Plan Note (Signed)
Consider intrnasal sterois  with instruction on correct technique because of PMH septal perforation.

## 2023-08-12 NOTE — Assessment & Plan Note (Signed)
Lipid panel update indicated.

## 2023-08-12 NOTE — Patient Instructions (Signed)
See assessment and plan under each diagnosis in the problem list and acutely for this visit 

## 2023-08-12 NOTE — Progress Notes (Unsigned)
NURSING HOME LOCATION:  [facility] ROOM NUMBER:  101  CODE STATUS:  Full Code  PCP:  Synthia Innocent NP  This is a nursing facility follow up visit of chronic medical diagnoses &  to document compliance with Regulation 483.30 (c) in The Long Term Care Survey Manual Phase 2 which mandates caregiver visit ( visits can alternate among physician, PA or NP as per statutes) within 10 days of 30 days / 60 days/ 90 days post admission to SNF date    Interim medical record and care since last SNF visit was updated with review of diagnostic studies and change in clinical status since last visit were documented.  HPI: She is a permanent resident of this facility with medical diagnoses of history of asthma, history of atrial fibrillation, chronic depression, history of nephrolithiasis, history of GI ulcers, remote history of silicosis, and history of stroke. Most recent labs completed 06/26/2023; labs were performed because of COVID positivity; COVID protocol was followed.  These revealed hypocalcemia with a value of 8.7, down from 9.4 in December 2023.CPR was 3.0 & D- dimer 0.28. H/H was 11.7/36.2, down from 12.6/39.5. Macrocytosis had resolved. LDL was 125 & A1c 5.4% in May.  Review of systems: She complains of pain in her neck and her hands.  Orthopedics saw her for pain in her knee and ankle which has responded to maintenance as scheduled Tylenol.  She complains of a drippy nose but no other extrinsic symptoms.  Despite the mild anemia; she denies any bleeding dyscrasias.   Constitutional: No fever, significant weight change, fatigue  Eyes: No redness, discharge, pain, vision change ENT/mouth: No nasal congestion,  purulent discharge, earache, change in hearing, sore throat  Cardiovascular: No chest pain, palpitations, paroxysmal nocturnal dyspnea, claudication, edema  Respiratory: No cough, sputum production, hemoptysis, DOE, significant snoring, apnea   Gastrointestinal: No  heartburn, dysphagia, abdominal pain, nausea /vomiting, rectal bleeding, melena, change in bowels Genitourinary: No dysuria, hematuria, pyuria, incontinence, nocturia Musculoskeletal: No joint stiffness, joint swelling, weakness, pain Dermatologic: No rash, pruritus, change in appearance of skin Neurologic: No dizziness, headache, syncope, seizures, numbness, tingling Psychiatric: No significant anxiety, depression, insomnia, anorexia Endocrine: No change in hair/skin/nails, excessive thirst, excessive hunger, excessive urination  Hematologic/lymphatic: No significant bruising, lymphadenopathy, abnormal bleeding Allergy/immunology: No itchy/watery eyes, significant sneezing, urticaria, angioedema  Physical exam:  Pertinent or positive findings she has a coarse tremor of her head and of both hands, greater on in the right hand.  Affect is flat and she exhibits masked facies.  There is decreased density of the eyebrows.  Nasal septum is deviated to the left.  There is no significant mucosal change intranasally.  She has occasional premature but rate is essentially regular and slow.  Fine rales are noted in the right lower lobe and to lesser extent on the left.  Pedal pulses are decreased.  She has trace edema at the sock line.  She has marked mixed MCP, PIP, DIP arthritic changes, greatest in the DIP joints.  Interosseous wasting is noted.  The right foot is inverted.  General appearance: Adequately nourished; no acute distress, increased work of breathing is present.   Lymphatic: No lymphadenopathy about the head, neck, axilla. Eyes: No conjunctival inflammation or lid edema is present. There is no scleral icterus. Ears:  External ear exam shows no significant lesions or deformities.   Nose:  External nasal examination shows no deformity or inflammation. Nasal mucosa are pink and moist without lesions, exudates Oral exam:  Lips and gums are healthy appearing. There is no oropharyngeal erythema or  exudate. Neck:  No thyromegaly, masses, tenderness noted.    Heart:  Normal rate and regular rhythm. S1 and S2 normal without gallop, murmur, click, rub .  Lungs: Chest clear to auscultation without wheezes, rhonchi, rales, rubs. Abdomen: Bowel sounds are normal. Abdomen is soft and nontender with no organomegaly, hernias, masses. GU: Deferred  Extremities:  No cyanosis, clubbing, edema  Neurologic exam : Cn 2-7 intact Strength equal  in upper & lower extremities Balance, Rhomberg, finger to nose testing could not be completed due to clinical state Deep tendon reflexes are equal Skin: Warm & dry w/o tenting. No significant lesions or rash.  See summary under each active problem in the Problem List with associated updated therapeutic plan

## 2023-08-12 NOTE — Assessment & Plan Note (Signed)
Rhythm is essentially regular with rre premature. Continue Eliquis.

## 2023-08-15 ENCOUNTER — Non-Acute Institutional Stay (SKILLED_NURSING_FACILITY): Payer: PPO | Admitting: Adult Health

## 2023-08-15 ENCOUNTER — Encounter: Payer: Self-pay | Admitting: Adult Health

## 2023-08-15 DIAGNOSIS — I4811 Longstanding persistent atrial fibrillation: Secondary | ICD-10-CM | POA: Diagnosis not present

## 2023-08-15 DIAGNOSIS — F323 Major depressive disorder, single episode, severe with psychotic features: Secondary | ICD-10-CM | POA: Diagnosis not present

## 2023-08-15 DIAGNOSIS — R29818 Other symptoms and signs involving the nervous system: Secondary | ICD-10-CM

## 2023-08-15 DIAGNOSIS — R4189 Other symptoms and signs involving cognitive functions and awareness: Secondary | ICD-10-CM

## 2023-08-15 NOTE — Progress Notes (Signed)
Location:  [facility] Room Number: 101 Place of Service:  SNF (31)   CODE STATUS: full   Allergies  Allergen Reactions   Ticlid [Ticlopidine] Shortness Of Breath   Nsaids Other (See Comments)    Bleeding ulcer   Prednisone Other (See Comments)    Makes her stomach hurt--knows this isn't an allergy but doesn't want to take it    Trazodone And Nefazodone     hjallucinations   Monistat [Miconazole] Swelling and Rash   Myrbetriq [name]] Rash    See 02/27/2023   Sulfa Antibiotics Rash    Chief Complaint  Patient presents with   Acute Visit    Care plan meeting     HPI:  We have come together for her care plan meeting. BIMS 15/15 mood 9/30: decreased energy depression nervous is followed by private psychiatrist. Uses wheelchair with no falls. She requires moderate to maximum assist with her adls. She is incontinent of bladder and frequently incontinent of bowel. Dietary: feeds self; regular diet appetite 76-100% weight 175.2 pounds. Therapy: max assist with ambulation; supervision with transfers; mobility mod I; uses afo for 4 hours daily. Activities:  is socializing more. She continues to be followed for her chronic illnesses including:   Long standing persistent atrial fibrillation    Neurocognitive deficits    Major depression with psychotic features  Past Medical History:  Diagnosis Date   Anemia    Arthritis    Asthma    Atrial fibrillation (HCC)    COVID-19 08/05/2022   Depression    H/O head injury    Hearing loss    Heart murmur    can be heard at times, Doctor said not to worry   History of kidney stones    History of stomach ulcers    Migraine    Pneumonia    Psittacosis    Stroke (HCC)    Weakness of neck 03/02/2013    Past Surgical History:  Procedure Laterality Date   ANKLE FRACTURE SURGERY Left    APPENDECTOMY     BREAST LUMPECTOMY     CESAREAN SECTION     x 3   I & D KNEE WITH POLY EXCHANGE Left 08/16/2022    Procedure: KNEE POLY EXCHANGE;  Surgeon: Bjorn Pippin, MD;  Location: [facility] OR;  Service: Orthopedics;  Laterality: Left;   KNEE ARTHROSCOPY WITH PATELLAR TENDON REPAIR Left 08/16/2022   Procedure: IRRIGATION AND DEBRIDEMENT LEFT KNEE REVISION WITH PATELLAR TENDON REPAIR and poly exchange;  Surgeon: Bjorn Pippin, MD;  Location: [facility] OR;  Service: Orthopedics;  Laterality: Left;   PARTIAL HYSTERECTOMY     ROTATOR CUFF REPAIR Right    TOTAL KNEE ARTHROPLASTY Left 07/22/2022   Procedure: TOTAL KNEE ARTHROPLASTY;  Surgeon: Joen Laura, MD;  Location: [facility];  Service: Orthopedics;  Laterality: Left;    Social History   Socioeconomic History   Marital status: Married    Spouse name: Not on file   Number of children: 3   Years of education: Not on file   Highest education level: Not on file  Occupational History   Occupation: retired  Tobacco Use   Smoking status: Never   Smokeless tobacco: Never  Vaping Use   Vaping status: Never Used  Substance and Sexual Activity   Alcohol use: Not Currently    Comment: occ beer   Drug use: No   Sexual activity: Yes    Birth control/protection: Surgical    Comment:  hyst  Other Topics Concern   Not on file  Social History Narrative   Not on file   Social Determinants of Health   Financial Resource Strain: Medium Risk (01/30/2021)   Overall Financial Resource Strain (CARDIA)    Difficulty of Paying Living Expenses: Somewhat hard  Food Insecurity: No Food Insecurity (08/15/2022)   Hunger Vital Sign    Worried About Running Out of Food in [place] Last Year: Never true    Ran Out of Food in [place] Last Year: Never true  Transportation Needs: No Transportation Needs (08/15/2022)   PRAPARE - Administrator, Civil Service (Medical): No    Lack of Transportation (Non-Medical): No  Physical Activity: Insufficiently Active (01/30/2021)   Exercise Vital Sign    Days of Exercise per Week: 2 days    Minutes of Exercise per Session: 10 min   Stress: No Stress Concern Present (01/30/2021)   [employer] of Occupational Health - Occupational Stress Questionnaire    Feeling of Stress : Only a little  Social Connections: Moderately Integrated (01/30/2021)   Social Connection and Isolation Panel [NHANES]    Frequency of Communication with Friends and Family: Twice a week    Frequency of Social Gatherings with Friends and Family: Twice a week    Attends Religious Services: More than 4 times per year    Active Member of [employer] or Organizations: No    Attends Banker Meetings: Never    Marital Status: Married  Catering manager Violence: Not At Risk (08/15/2022)   Humiliation, Afraid, Rape, and Kick questionnaire    Fear of Current or Ex-Partner: No    Emotionally Abused: No    Physically Abused: No    Sexually Abused: No   Family History  Problem Relation Age of Onset   Cancer Father    Heart disease Brother    Alcohol abuse Brother    Depression Maternal Grandmother    Depression Grandchild    Breast cancer Daughter       VITAL SIGNS BP 132/78   Pulse 62   Temp 97.7 F (36.5 C)   Resp 18   Ht 5\' 5"  (1.651 m)   Wt 175 lb 9.6 oz (79.7 kg)   SpO2 95%   BMI 29.22 kg/m   Outpatient Encounter Medications as of 08/15/2023  Medication Sig   acetaminophen (TYLENOL) 650 MG CR tablet Take 650 mg by mouth every 8 (eight) hours.   apixaban (ELIQUIS) 5 MG TABS tablet Take 1 tablet (5 mg total) by mouth 2 (two) times daily.   ARIPiprazole (ABILIFY) 10 MG tablet Take 10 mg by mouth daily.   busPIRone (BUSPAR) 30 MG tablet Take 1 tablet (30 mg total) by mouth 2 (two) times daily.   Deutetrabenazine Smith (AUSTEDO XR) 30 MG TB24 Take 30 mg by mouth daily at 2 PM.   donepezil (ARICEPT) 10 MG tablet Take 10 mg by mouth at bedtime.   DULoxetine (CYMBALTA) 60 MG capsule Take 60 mg by mouth daily.   eszopiclone (LUNESTA) 1 MG TABS tablet Take 1 tablet (1 mg total) by mouth at bedtime. Take immediately before bedtime    fexofenadine (ALLEGRA) 180 MG tablet Take 180 mg by mouth daily.   furosemide (LASIX) 20 MG tablet Take 20 mg by mouth.   Magnesium Hydroxide (MILK OF MAGNESIA PO) Take 30 mLs by mouth as needed (constipation).   metoprolol tartrate (LOPRESSOR) 25 MG tablet Take 1 tablet (25 mg total) by mouth 2 (two) times  daily.   modafinil (PROVIGIL) 200 MG tablet Take 0.5 tablets (100 mg total) by mouth daily.   rosuvastatin (CRESTOR) 5 MG tablet Take 5 mg by mouth at bedtime.   senna (SENOKOT) 8.6 MG TABS tablet Take 1 tablet (8.6 mg total) by mouth 2 (two) times daily.   SUMAtriptan (IMITREX) 100 MG tablet Take 100 mg by mouth every 2 (two) hours as needed for migraine or headache. May repeat in 2 hours if headache persists or recurs.   UNABLE TO FIND Diet: Regular   Vitamins A & D (VITAMIN A & D) ointment Apply 1 Application topically 2 (two) times daily. To left heel   ZINC OXIDE EX Apply 1 Application topically See admin instructions. every shift and prn Special Instructions: Apply to sacrum, bilateral buttocks and coccyx q shift for redness.   No facility-administered encounter medications on file as of 08/15/2023.     SIGNIFICANT DIAGNOSTIC EXAMS  PREVIOUS   08-16-22: chest x-ray: 1. No acute intrathoracic process.   08-21-22: ct of head:  1. Possible sulcal effacement and loss of gray-white differentiation in [place] posterior right frontal lobe may represent subacute infarct versus artifact. Consider further evaluation with MRI. 2. Mild chronic small vessel ischemia.  08-22-22: MRI head:  1. No acute intracranial abnormality. 2. Findings of chronic small vessel ischemia and volume loss.  NO NEW EXAMS   LABS REVIEWED PREVIOUS   08-14-22: wbc 9.3; hgb 10.5; hct 31.8 mcv 94.9 plt 324; glucose 93; bun 15 creat 0.66; k+ 3.6; na++ 138; ca 9.1; gfr >60 08-23-22: wbc 9.1; hgb 10.2; hct 31.8; mcv 94.9 plt 379'; glucose 100; bun 12; creat 0.61; k+ 4.3; na++ 139; ca 9.0; gfr >60 10-11-22: glucose 87;  bun 16; creat 0.59; k+ 3.7; na++ 137; ca 9.4; gfr >60 10-17-22: glucose 98; bun 17; creat 0.56; k+ 4.1; na++ 138; ca 9.4; gfr >60 11-14-22: hepatitis C nr 02-28-23: wbc 7.6; hgb 12.1; hct 375; mcv 100.5 plt 216   03-24-23: wbc 8.2; hgb 12.6; hct 39.5; mcv 101.1 plt 201; hgb A1c 5.4 chol 197; ldl 125; hdl 91; hdl 54  06-26-23: wbc 6.1; hgb 11.7; hct 36.2; mcv 98.4 plt 160; glucose 97; bun 14; creat 0.57; k+ 3.9; na++ 135; ca 8.7; gfr >60 d-dimer 0.28  CRP 3.0  NO NEW LABS.   Review of Systems  Constitutional:  Negative for malaise/fatigue.  Respiratory:  Negative for cough and shortness of breath.   Cardiovascular:  Negative for chest pain, palpitations and leg swelling.  Gastrointestinal:  Negative for abdominal pain, constipation and heartburn.  Musculoskeletal:  Negative for back pain, joint pain and myalgias.  Skin: Negative.   Neurological:  Negative for dizziness.  Psychiatric/Behavioral:  Positive for depression. [place] patient is nervous/anxious.     Physical Exam Constitutional:      General: She is not in acute distress.    Appearance: She is well-developed. She is not diaphoretic.  Neck:     Thyroid: No thyromegaly.  Cardiovascular:     Rate and Rhythm: Normal rate and regular rhythm.     Pulses: Normal pulses.     Heart sounds: Murmur heard.  Pulmonary:     Effort: Pulmonary effort is normal. No respiratory distress.     Breath sounds: Normal breath sounds.  Abdominal:     General: Bowel sounds are normal. There is no distension.     Palpations: Abdomen is soft.     Tenderness: There is no abdominal tenderness.  Musculoskeletal:  Cervical back: Neck supple.     Right lower leg: No edema.     Left lower leg: No edema.     Comments: Has abnormal facial movements and hand tremors      10-14-22: SLUMS 18/30   Lymphadenopathy:     Cervical: No cervical adenopathy.  Skin:    General: Skin is warm and dry.  Neurological:     Mental Status: She is alert. Mental status is  at baseline.  Psychiatric:        Mood and Affect: Mood normal.       ASSESSMENT/ PLAN:  TODAY  Long standing persistent atrial fibrillation Neurocognitive deficits Major depression with psychotic features  Will continue current medications Will continue current plan of care Will continue to monitor her status.   Time spent with patient 40 minutes: medications; plan of care dietary    Synthia Innocent NP [facility]  call [phone]

## 2023-08-19 DIAGNOSIS — G2401 Drug induced subacute dyskinesia: Secondary | ICD-10-CM | POA: Diagnosis not present

## 2023-08-19 DIAGNOSIS — F332 Major depressive disorder, recurrent severe without psychotic features: Secondary | ICD-10-CM | POA: Diagnosis not present

## 2023-08-19 DIAGNOSIS — F5105 Insomnia due to other mental disorder: Secondary | ICD-10-CM | POA: Diagnosis not present

## 2023-08-19 DIAGNOSIS — F411 Generalized anxiety disorder: Secondary | ICD-10-CM | POA: Diagnosis not present

## 2023-08-21 DIAGNOSIS — S8002XD Contusion of left knee, subsequent encounter: Secondary | ICD-10-CM | POA: Diagnosis not present

## 2023-08-25 ENCOUNTER — Other Ambulatory Visit: Payer: Self-pay | Admitting: Adult Health

## 2023-08-25 MED ORDER — MODAFINIL 200 MG PO TABS: 100.0000 mg | ORAL_TABLET | Freq: Every day | ORAL | 0 refills | Status: DC

## 2023-09-01 ENCOUNTER — Other Ambulatory Visit: Payer: Self-pay | Admitting: Adult Health

## 2023-09-02 DIAGNOSIS — H903 Sensorineural hearing loss, bilateral: Secondary | ICD-10-CM | POA: Diagnosis not present

## 2023-09-10 ENCOUNTER — Ambulatory Visit: Payer: PPO | Admitting: Psychiatry

## 2023-09-16 ENCOUNTER — Non-Acute Institutional Stay (SKILLED_NURSING_FACILITY): Payer: PPO | Admitting: Adult Health

## 2023-09-16 DIAGNOSIS — G894 Chronic pain syndrome: Secondary | ICD-10-CM

## 2023-09-16 DIAGNOSIS — R600 Localized edema: Secondary | ICD-10-CM

## 2023-09-16 DIAGNOSIS — D649 Anemia, unspecified: Secondary | ICD-10-CM | POA: Diagnosis not present

## 2023-09-17 NOTE — Progress Notes (Signed)
Location:  [facility] Room Number: 101 Place of Service:  SNF (31)   CODE STATUS: full   Allergies  Allergen Reactions   Ticlid [Ticlopidine] Shortness Of Breath   Nsaids Other (See Comments)    Bleeding ulcer   Prednisone Other (See Comments)    Makes her stomach hurt--knows this isn't an allergy but doesn't want to take it    Trazodone And Nefazodone     hjallucinations   Monistat [Miconazole] Swelling and Rash   Myrbetriq Theodosia Paling Er] Rash    See 02/27/2023   Sulfa Antibiotics Rash    Chief Complaint  Patient presents with   Medical Management of Chronic Issues        Chronic pain syndrome/periprosthetic fracture around internal prosthetic left knee joint sequela:     Bilateral lower extremity edema    Chronic anemia     HPI:  She is a 77 year old long term resident of this facility being seen for the management of her chronic illnesses: Chronic pain syndrome/periprosthetic fracture around internal prosthetic left knee joint sequela:     Bilateral lower extremity edema    Chronic anemia. There are no reports of uncontrolled pain. She continues with TD movements. Her weight is stable.   Past Medical History:  Diagnosis Date   Anemia    Arthritis    Asthma    Atrial fibrillation (HCC)    COVID-19 08/05/2022   Depression    H/O head injury    Hearing loss    Heart murmur    can be heard at times, Doctor said not to worry   History of kidney stones    History of stomach ulcers    Migraine    Pneumonia    Psittacosis    Stroke (HCC)    Weakness of neck 03/02/2013    Past Surgical History:  Procedure Laterality Date   ANKLE FRACTURE SURGERY Left    APPENDECTOMY     BREAST LUMPECTOMY     CESAREAN SECTION     x 3   I & D KNEE WITH POLY EXCHANGE Left 08/16/2022   Procedure: KNEE POLY EXCHANGE;  Surgeon: Bjorn Pippin, MD;  Location: [facility] OR;  Service: Orthopedics;  Laterality: Left;   KNEE ARTHROSCOPY WITH PATELLAR TENDON REPAIR Left  08/16/2022   Procedure: IRRIGATION AND DEBRIDEMENT LEFT KNEE REVISION WITH PATELLAR TENDON REPAIR and poly exchange;  Surgeon: Bjorn Pippin, MD;  Location: [facility] OR;  Service: Orthopedics;  Laterality: Left;   PARTIAL HYSTERECTOMY     ROTATOR CUFF REPAIR Right    TOTAL KNEE ARTHROPLASTY Left 07/22/2022   Procedure: TOTAL KNEE ARTHROPLASTY;  Surgeon: Joen Laura, MD;  Location: [facility];  Service: Orthopedics;  Laterality: Left;    Social History   Socioeconomic History   Marital status: Married    Spouse name: Not on file   Number of children: 3   Years of education: Not on file   Highest education level: Not on file  Occupational History   Occupation: retired  Tobacco Use   Smoking status: Never   Smokeless tobacco: Never  Vaping Use   Vaping status: Never Used  Substance and Sexual Activity   Alcohol use: Not Currently    Comment: occ beer   Drug use: No   Sexual activity: Yes    Birth control/protection: Surgical    Comment: hyst  Other Topics Concern   Not on file  Social History Narrative   Not on  file   Social Determinants of Health   Financial Resource Strain: Medium Risk (01/30/2021)   Overall Financial Resource Strain (CARDIA)    Difficulty of Paying Living Expenses: Somewhat hard  Food Insecurity: No Food Insecurity (08/15/2022)   Hunger Vital Sign    Worried About Running Out of Food in the Last Year: Never true    Ran Out of Food in the Last Year: Never true  Transportation Needs: No Transportation Needs (08/15/2022)   PRAPARE - Administrator, Civil Service (Medical): No    Lack of Transportation (Non-Medical): No  Physical Activity: Insufficiently Active (01/30/2021)   Exercise Vital Sign    Days of Exercise per Week: 2 days    Minutes of Exercise per Session: 10 min  Stress: No Stress Concern Present (01/30/2021)   [employer] of Occupational Health - Occupational Stress Questionnaire    Feeling of Stress : Only a little  Social  Connections: Moderately Integrated (01/30/2021)   Social Connection and Isolation Panel [NHANES]    Frequency of Communication with Friends and Family: Twice a week    Frequency of Social Gatherings with Friends and Family: Twice a week    Attends Religious Services: More than 4 times per year    Active Member of [employer] or Organizations: No    Attends Banker Meetings: Never    Marital Status: Married  Catering manager Violence: Not At Risk (08/15/2022)   Humiliation, Afraid, Rape, and Kick questionnaire    Fear of Current or Ex-Partner: No    Emotionally Abused: No    Physically Abused: No    Sexually Abused: No   Family History  Problem Relation Age of Onset   Cancer Father    Heart disease Brother    Alcohol abuse Brother    Depression Maternal Grandmother    Depression Grandchild    Breast cancer Daughter       VITAL SIGNS BP 121/79   Pulse 72   Temp (!) 97.3 F (36.3 C)   Resp 20   Ht 5\' 5"  (1.651 m)   Wt 175 lb 9.6 oz (79.7 kg)   SpO2 98%   BMI 29.22 kg/m   Outpatient Encounter Medications as of 09/16/2023  Medication Sig   busPIRone (BUSPAR) 10 MG tablet Take 20 mg by mouth 2 (two) times daily.   acetaminophen (TYLENOL) 650 MG CR tablet Take 650 mg by mouth every 8 (eight) hours.   apixaban (ELIQUIS) 5 MG TABS tablet Take 1 tablet (5 mg total) by mouth 2 (two) times daily.   ARIPiprazole (ABILIFY) 10 MG tablet Take 10 mg by mouth daily.   Deutetrabenazine ER (AUSTEDO XR) 30 MG TB24 Take 30 mg by mouth daily at 2 PM.   donepezil (ARICEPT) 10 MG tablet Take 10 mg by mouth at bedtime.   DULoxetine (CYMBALTA) 60 MG capsule Take 60 mg by mouth daily.   eszopiclone (LUNESTA) 1 MG TABS tablet Take 1 tablet (1 mg total) by mouth at bedtime. Take immediately before bedtime   fexofenadine (ALLEGRA) 180 MG tablet Take 180 mg by mouth daily.   furosemide (LASIX) 20 MG tablet Take 20 mg by mouth.   Magnesium Hydroxide (MILK OF MAGNESIA PO) Take 30 mLs by mouth as  needed (constipation).   metoprolol tartrate (LOPRESSOR) 25 MG tablet Take 1 tablet (25 mg total) by mouth 2 (two) times daily.   modafinil (PROVIGIL) 200 MG tablet Take 0.5 tablets (100 mg total) by mouth daily.   rosuvastatin (  CRESTOR) 5 MG tablet Take 5 mg by mouth at bedtime.   senna (SENOKOT) 8.6 MG TABS tablet Take 1 tablet (8.6 mg total) by mouth 2 (two) times daily.   SUMAtriptan (IMITREX) 100 MG tablet Take 100 mg by mouth every 2 (two) hours as needed for migraine or headache. May repeat in 2 hours if headache persists or recurs.   UNABLE TO FIND Diet: Regular   Vitamins A & D (VITAMIN A & D) ointment Apply 1 Application topically 2 (two) times daily. To left heel   ZINC OXIDE EX Apply 1 Application topically See admin instructions. every shift and prn Special Instructions: Apply to sacrum, bilateral buttocks and coccyx q shift for redness.   [DISCONTINUED] busPIRone (BUSPAR) 30 MG tablet Take 1 tablet (30 mg total) by mouth 2 (two) times daily.   No facility-administered encounter medications on file as of 09/16/2023.     SIGNIFICANT DIAGNOSTIC EXAMS  LABS REVIEWED PREVIOUS   10-11-22: glucose 87; bun 16; creat 0.59; k+ 3.7; na++ 137; ca 9.4; gfr >60 10-17-22: glucose 98; bun 17; creat 0.56; k+ 4.1; na++ 138; ca 9.4; gfr >60 11-14-22: hepatitis C nr 02-28-23: wbc 7.6; hgb 12.1; hct 375; mcv 100.5 plt 216   03-24-23: wbc 8.2; hgb 12.6; hct 39.5; mcv 101.1 plt 201; hgb A1c 5.4 chol 197; ldl 125; hdl 91; hdl 54  06-26-23: wbc 6.1; hgb 11.7; hct 36.2; mcv 98.4 plt 160; glucose 97; bun 14; creat 0.57; k+ 3.9; na++ 135; ca 8.7; gfr >60 d-dimer 0.28  CRP 3.0  NO NEW LABS.   Review of Systems  Constitutional:  Negative for malaise/fatigue.  Respiratory:  Negative for cough and shortness of breath.   Cardiovascular:  Negative for chest pain, palpitations and leg swelling.  Gastrointestinal:  Negative for abdominal pain, constipation and heartburn.  Musculoskeletal:  Negative for back  pain, joint pain and myalgias.  Skin: Negative.   Neurological:  Negative for dizziness.  Psychiatric/Behavioral:  The patient is not nervous/anxious.    Physical Exam Constitutional:      General: She is not in acute distress.    Appearance: She is well-developed and overweight. She is not diaphoretic.  Neck:     Thyroid: No thyromegaly.  Cardiovascular:     Rate and Rhythm: Normal rate and regular rhythm.     Pulses: Normal pulses.     Heart sounds: Murmur heard.  Pulmonary:     Effort: Pulmonary effort is normal. No respiratory distress.     Breath sounds: Normal breath sounds.  Abdominal:     General: Bowel sounds are normal. There is no distension.     Palpations: Abdomen is soft.     Tenderness: There is no abdominal tenderness.  Musculoskeletal:     Cervical back: Neck supple.     Right lower leg: No edema.     Left lower leg: No edema.     Comments:  Has abnormal facial movements and hand tremors      10-14-22: SLUMS 18/30   Lymphadenopathy:     Cervical: No cervical adenopathy.  Skin:    General: Skin is warm and dry.  Neurological:     Mental Status: She is alert. Mental status is at baseline.  Psychiatric:        Mood and Affect: Mood normal.       ASSESSMENT/ PLAN:  TODAY  Chronic pain syndrome/periprosthetic fracture around internal prosthetic left knee joint sequela: is taking cymbalta 60 mg daily and tylenol cr 650  mg three times daily  2. Bilateral lower extremity edema will continue lasix 20 mg daily   3. Chronic anemia: hgb 12.1   PREVIOUS   4. Chronic insomnia: will continue lunesta 1 mg nightly   5.  Psychosis in elderly/major depression with psychotic features: will continue abilify 10 mg daily cymbalta 60 mg daily buspar 20 mg twice daily  6. Somnolence: will continue provigil 100 mg daily   7. Protein calorie malnutrition: will continue supplements as directed  8. Urinary incontinence: is off myrbetriq due to rash.   9. Longstanding  persistent atrial fibrillation/history of DVT: heart rate is stable will continue eliquis 5 mg twice daily and is taking lopressor 25 mg twice daily for rate control  10. Migraine without status migrainosus, nonintractable unspecified type: will monitor   11.  Uncomplicated asthma unspecified asthma severity unspecified: unspecified whether persistent: is on allegra 180 mg daily   12. Chronic constipation: will continue senna s twice daily   13. Vascular dementia without behavioral disturbance/neurocognitive deficit: weight is 175 pounds; will continue aricept 10 mg daily   14. Tardive dyskinesia: does continue to have abnormal facial and hand movements; has failed ingrezza therapy; will continue auestuda xr 30 mg daily    Synthia Innocent NP [facility]   call [phone]

## 2023-09-18 ENCOUNTER — Encounter: Payer: Self-pay | Admitting: Psychiatry

## 2023-09-18 ENCOUNTER — Ambulatory Visit (INDEPENDENT_AMBULATORY_CARE_PROVIDER_SITE_OTHER): Payer: PPO | Admitting: Psychiatry

## 2023-09-18 DIAGNOSIS — F5101 Primary insomnia: Secondary | ICD-10-CM

## 2023-09-18 DIAGNOSIS — F330 Major depressive disorder, recurrent, mild: Secondary | ICD-10-CM | POA: Diagnosis not present

## 2023-09-18 DIAGNOSIS — F411 Generalized anxiety disorder: Secondary | ICD-10-CM

## 2023-09-18 NOTE — Progress Notes (Signed)
[name] [id number] 1946-02-22 77 y.o.  Subjective:   Patient ID:  [name] is a 77 y.o. (DOB July 20, 1946) female.  Chief Complaint:  Chief Complaint  Patient presents with   Insomnia   Follow-up    Anxiety, depression    HPI Adine Heimann Conrad presents to the office today for follow-up of anxiety, depression, and insomnia.   Staff report that she has not been sleeping well. Pt reports that shehas had difficulty with sleep for the last 2 months. She reports poor sleep overall. Reports sleeping 2-3 hours, then awake for 5 hours.   She reports feeling more "tired." She reports that her anxiety is consistent with baseline.   She reports worsening depression in response to some situational stressors. She reports persistent depressed mood. She reports some irritability. Denies panic attacks. She reports anxiety is "ok... not anything in particular on my mind." She reports that she has not been able to re-start therapy and this is causing frustration. Motivation has been low. Denies difficulty with concentration and reports that she was better able to read a book recently. Appetite has been good. Denies SI.   She reports that she does not go to many of the activities at [facility]. She reports that she wishes she were not at [facility].   Denies any psychosocial changes. She reports diminished hearing and that this limits social interaction.   She reports continued upper extremity involuntary movements. She notices some involuntary licking of her lips. She reports that she did not see an change with decreasing dose of Austedo XR.   Past medication trials: Celexa Pristiq Wellbutrin-hallucinations Effexor Prozac Zoloft Lexapro Nefazodone-effective and well-tolerated Cymbalta Abilify Latuda-Adverse reaction. Worsening depression Lamotrigine-Worsening mood and anxiety Nuedexta Trazodone Gabapentin- Disrupted sleep schedules, nightmares.   Vistaril Nuvigil Provigil Memantine Lunesta- Effective for insomnia  AIMS    Flowsheet Row Office Visit from 03/27/2023 in [facility] Office Visit from 02/11/2023 in [facility] Office Visit from 06/18/2022 in [facility] Office Visit from 05/02/2022 in [facility] Office Visit from 07/24/2021 in [facility]  AIMS Total Score 17 10 6 11  0      GAD-7    Flowsheet Row Office Visit from 01/30/2021 in [facility]  Total GAD-7 Score 1      Mini-Mental    Flowsheet Row Office Visit from 04/03/2022 in [facility]  Total Score (max 30 points ) 30      PHQ2-9    Flowsheet Row Nursing Home from 02/06/2023 in [facility] Nursing Home from 12/12/2022 in [facility] Home from 11/06/2022 in [facility] Office Visit from 01/30/2021 in [facility] for [facility] at [facility] Office Visit from 05/11/2019 in [facility] at [facility]  PHQ-2 Total Score 0 0 1 2 1   PHQ-9 Total Score 1 0 3 6 --      Flowsheet Row Pre-Admission Testing 61 from 10/11/2022 in [facility] Admission (Discharged) from 08/14/2022 in [facility] Admission (Discharged) from 07/22/2022 in [place]  C-SSRS RISK CATEGORY No Risk No Risk No Risk        Review of Systems:  Review of Systems  HENT:  Positive for hearing loss.        She does not know how to put batteries in her hearing aids and is waiting on instructions.   Musculoskeletal:  Positive for gait problem.  Psychiatric/Behavioral:         Please refer to HPI    Medications: I have reviewed the patient's current  medications.  Current Outpatient Medications  Medication Sig Dispense Refill   apixaban (ELIQUIS) 5 MG TABS tablet Take 1 tablet (5 mg total) by mouth 2 (two) times daily. 60 tablet    ARIPiprazole (ABILIFY) 10 MG tablet Take 10 mg by mouth daily.     busPIRone (BUSPAR) 10 MG tablet Take 20 mg by mouth 2 (two) times daily.     Deutetrabenazine ER (AUSTEDO XR) 30 MG TB24 Take 30 mg by mouth daily at 2 PM.     donepezil (ARICEPT) 10 MG tablet Take 10 mg by mouth at bedtime.     DULoxetine (CYMBALTA) 60 MG capsule Take 60 mg by mouth daily.     eszopiclone (LUNESTA) 1 MG TABS tablet Take 1 tablet (1 mg total) by mouth at bedtime. Take immediately before bedtime 30 tablet 0   fexofenadine (ALLEGRA) 180 MG tablet Take 180 mg by mouth daily.     furosemide (LASIX) 20 MG tablet Take 20 mg by mouth.     metoprolol tartrate (LOPRESSOR) 25 MG tablet Take 1 tablet (25 mg total) by mouth 2 (two) times daily. 180 tablet 3   modafinil (PROVIGIL) 200 MG tablet Take 0.5 tablets (100 mg total) by mouth daily. 15 tablet 0   rosuvastatin (CRESTOR) 5 MG tablet Take 5 mg by mouth at bedtime.     senna (SENOKOT) 8.6 MG TABS tablet Take 1 tablet (8.6 mg total) by mouth 2 (two) times daily. 120 tablet 0   SUMAtriptan (IMITREX) 100 MG tablet Take 100 mg by mouth every 2 (two) hours as needed for migraine or headache. May repeat in 2 hours if headache persists or recurs.     acetaminophen (TYLENOL) 650 MG CR tablet Take 650 mg by mouth every 8 (eight) hours.     Magnesium Hydroxide (MILK OF MAGNESIA PO) Take 30 mLs by mouth as needed (constipation).     UNABLE TO FIND Diet: Regular     Vitamins A & D (VITAMIN A & D) ointment Apply 1 Application topically 2 (two) times daily. To left heel     ZINC OXIDE EX Apply 1 Application topically See admin instructions. every shift and prn Special Instructions: Apply to sacrum, bilateral buttocks and coccyx q shift for redness.     No current facility-administered medications  for this visit.    Medication Side Effects: Other: Possible Parkinsonism  Allergies:  Allergies  Allergen Reactions   Ticlid [Ticlopidine] Shortness Of Breath   Nsaids Other (See Comments)    Bleeding ulcer   Prednisone Other (See Comments)    Makes her stomach hurt--knows this isn't an allergy but doesn't want to take it    Trazodone And Nefazodone     hjallucinations   Monistat [Miconazole] Swelling and Rash   Myrbetriq [Mirabegron Er] Rash    See 02/27/2023   Sulfa Antibiotics Rash    Past Medical History:  Diagnosis Date   Anemia    Arthritis    Asthma    Atrial fibrillation (HCC)    COVID-19 08/05/2022   Depression    H/O head injury    Hearing loss    Heart murmur  can be heard at times, Doctor said not to worry   History of kidney stones    History of stomach ulcers    Migraine    Pneumonia    Psittacosis    Stroke [facility])    Weakness of neck 03/02/2013    Past Medical History, Surgical history, Social history, and Family history were reviewed and updated as appropriate.   Please see review of systems for further details on the patient's review from today.   Objective:   Physical Exam:  There were no vitals taken for this visit.  Physical Exam Constitutional:      General: She is not in acute distress. Musculoskeletal:        General: No deformity.  Neurological:     Mental Status: She is alert and oriented to person, place, and time.     Coordination: Coordination normal.  Psychiatric:        Attention and Perception: Attention and perception normal. She does not perceive auditory or visual hallucinations.        Mood and Affect: Affect is not labile, blunt, angry or inappropriate.        Speech: Speech normal.        Behavior: Behavior normal.        Thought Content: Thought content normal. Thought content is not paranoid or delusional. Thought content does not include homicidal or suicidal ideation. Thought content does not include homicidal  or suicidal plan.        Cognition and Memory: Cognition and memory normal.        Judgment: Judgment normal.     Comments: Insight intact Mood is depressed in response to psychosocial stressors     Lab Review:     Component Value Date/Time   NA 135 06/26/2023 0800   NA 140 05/27/2017 1553   K 3.9 06/26/2023 0800   CL 98 06/26/2023 0800   CO2 30 06/26/2023 0800   GLUCOSE 97 06/26/2023 0800   BUN 14 06/26/2023 0800   BUN 15 05/27/2017 1553   CREATININE 0.57 06/26/2023 0800   CALCIUM 8.7 (L) 06/26/2023 0800   PROT 7.0 07/18/2022 1459   PROT 7.4 05/27/2017 1553   ALBUMIN 4.1 07/18/2022 1459   ALBUMIN 4.5 05/27/2017 1553   AST 24 07/18/2022 1459   ALT 15 07/18/2022 1459   ALKPHOS 50 07/18/2022 1459   BILITOT 0.5 07/18/2022 1459   BILITOT 0.4 05/27/2017 1553   GFRNONAA >60 06/26/2023 0800   GFRAA 104 05/27/2017 1553       Component Value Date/Time   WBC 6.1 06/26/2023 0800   RBC 3.68 (L) 06/26/2023 0800   HGB 11.7 (L) 06/26/2023 0800   HCT 36.2 06/26/2023 0800   PLT 160 06/26/2023 0800   MCV 98.4 06/26/2023 0800   MCH 31.8 06/26/2023 0800   MCHC 32.3 06/26/2023 0800   RDW 12.6 06/26/2023 0800   LYMPHSABS 1.0 02/28/2023 0800   MONOABS 0.3 02/28/2023 0800   EOSABS 0.0 02/28/2023 0800   BASOSABS 0.0 02/28/2023 0800    No results found for: "POCLITH", "LITHIUM"   No results found for: "PHENYTOIN", "PHENOBARB", "VALPROATE", "CBMZ"   .res Assessment: Plan:    45 minutes spent dedicated to the care of this patient on the date of this encounter to include pre-visit review of records, ordering of medication, post visit documentation, and face-to-face time with the patient discussing treatment plan. Agree with neurology's recommendation of a 2 week medication holiday off of Austedo XR to determine if this  is helpful for parkinsonism since pt continues to exhibit parkisonism after dose reduction in Austedo XR.  Recommend increase in Lunesta to 2 mg at bedtime for insomnia  since 1 mg dose is no longer effective. Pt has taken Lunesta 2 mg in the past and it has been effective and well-tolerated.  Recommend continuing Cymbalta 60 mg daily for depression and anxiety since pt has had worsening depression with attempt to reduce medication in the past.  Continue Abilify 10 mg daily for augmentation of depression. Pt has had worsening depression in the past with attempts to reduce Abilify and/or switch to an alternate medication.  Buspar was recently decreased from 30 mg po BID to 20 mg po BID. Will continue to monitor anxiety in response to dose reduction.  Continue Modafinil 100 mg daily for excessive daytime somnolence and concentration.  Pt to follow-up with this provider in 6 weeks or sooner if clinically indicated.  Patient advised to contact office with any questions, adverse effects, or acute worsening in signs and symptoms.    Isatu was seen today for insomnia and follow-up.  Diagnoses and all orders for this visit:  Generalized anxiety disorder  Primary insomnia  Mild episode of recurrent major depressive disorder (HCC)     Please see After Visit Summary for patient specific instructions.  Future Appointments  Date Time Provider Department Center  10/06/2023  9:20 AM Mallipeddi, Orion Modest, MD [facility] [place] [facility]  10/30/2023 10:00 AM Corie Chiquito, PMHNP [facility] None    No orders of the defined types were placed in this encounter.   -------------------------------

## 2023-09-19 DIAGNOSIS — L84 Corns and callosities: Secondary | ICD-10-CM | POA: Diagnosis not present

## 2023-09-19 DIAGNOSIS — I739 Peripheral vascular disease, unspecified: Secondary | ICD-10-CM | POA: Diagnosis not present

## 2023-09-19 DIAGNOSIS — L603 Nail dystrophy: Secondary | ICD-10-CM | POA: Diagnosis not present

## 2023-09-19 DIAGNOSIS — L602 Onychogryphosis: Secondary | ICD-10-CM | POA: Diagnosis not present

## 2023-09-22 ENCOUNTER — Encounter: Payer: Self-pay | Admitting: Adult Health

## 2023-09-22 ENCOUNTER — Non-Acute Institutional Stay (SKILLED_NURSING_FACILITY): Payer: PPO | Admitting: Adult Health

## 2023-09-22 DIAGNOSIS — Z8673 Personal history of transient ischemic attack (TIA), and cerebral infarction without residual deficits: Secondary | ICD-10-CM

## 2023-09-22 DIAGNOSIS — F323 Major depressive disorder, single episode, severe with psychotic features: Secondary | ICD-10-CM

## 2023-09-22 NOTE — Progress Notes (Unsigned)
Location:  [facility] Room Number: 101 Place of Service:  SNF (31)   CODE STATUS: full   Allergies  Allergen Reactions   Ticlid [Ticlopidine] Shortness Of Breath   Nsaids Other (See Comments)    Bleeding ulcer   Prednisone Other (See Comments)    Makes her stomach hurt--knows this isn't an allergy but doesn't want to take it    Trazodone And Nefazodone     hjallucinations   Monistat [Miconazole] Swelling and Rash   Myrbetriq Theodosia Paling Er] Rash    See 02/27/2023   Sulfa Antibiotics Rash    Chief Complaint  Patient presents with   Acute Visit    Mood state     HPI:  She has had her buspar dose lowered for a required gdr. There are concerns that her depression is getting worse. She had declined to get out of bed over this past weekend. She denies any worsening anxiety. She is taking austedo 20 mg daily. This medication can make depression symptoms worse.   Past Medical History:  Diagnosis Date   Anemia    Arthritis    Asthma    Atrial fibrillation (HCC)    COVID-19 08/05/2022   Depression    H/O head injury    Hearing loss    Heart murmur    can be heard at times, Doctor said not to worry   History of kidney stones    History of stomach ulcers    Migraine    Pneumonia    Psittacosis    Stroke (HCC)    Weakness of neck 03/02/2013    Past Surgical History:  Procedure Laterality Date   ANKLE FRACTURE SURGERY Left    APPENDECTOMY     BREAST LUMPECTOMY     CESAREAN SECTION     x 3   I & D KNEE WITH POLY EXCHANGE Left 08/16/2022   Procedure: KNEE POLY EXCHANGE;  Surgeon: Bjorn Pippin, MD;  Location: [facility] OR;  Service: Orthopedics;  Laterality: Left;   KNEE ARTHROSCOPY WITH PATELLAR TENDON REPAIR Left 08/16/2022   Procedure: IRRIGATION AND DEBRIDEMENT LEFT KNEE REVISION WITH PATELLAR TENDON REPAIR and poly exchange;  Surgeon: Bjorn Pippin, MD;  Location: [facility] OR;  Service: Orthopedics;  Laterality: Left;   PARTIAL HYSTERECTOMY     ROTATOR  CUFF REPAIR Right    TOTAL KNEE ARTHROPLASTY Left 07/22/2022   Procedure: TOTAL KNEE ARTHROPLASTY;  Surgeon: Joen Laura, MD;  Location: [facility] ORS;  Service: Orthopedics;  Laterality: Left;    Social History   Socioeconomic History   Marital status: Married    Spouse name: Not on file   Number of children: 3   Years of education: Not on file   Highest education level: Not on file  Occupational History   Occupation: retired  Tobacco Use   Smoking status: Never   Smokeless tobacco: Never  Vaping Use   Vaping status: Never Used  Substance and Sexual Activity   Alcohol use: Not Currently    Comment: occ beer   Drug use: No   Sexual activity: Yes    Birth control/protection: Surgical    Comment: hyst  Other Topics Concern   Not on file  Social History Narrative   Not on file   Social Determinants of Health   Financial Resource Strain: Medium Risk (01/30/2021)   Overall Financial Resource Strain (CARDIA)    Difficulty of Paying Living Expenses: Somewhat hard  Food Insecurity: No Food Insecurity (08/15/2022)  Hunger Vital Sign    Worried About Running Out of Food in [place] Last Year: Never true    Ran Out of Food in [place] Last Year: Never true  Transportation Needs: No Transportation Needs (08/15/2022)   PRAPARE - Administrator, Civil Service (Medical): No    Lack of Transportation (Non-Medical): No  Physical Activity: Insufficiently Active (01/30/2021)   Exercise Vital Sign    Days of Exercise per Week: 2 days    Minutes of Exercise per Session: 10 min  Stress: No Stress Concern Present (01/30/2021)   [employer] of Occupational Health - Occupational Stress Questionnaire    Feeling of Stress : Only a little  Social Connections: Moderately Integrated (01/30/2021)   Social Connection and Isolation Panel [NHANES]    Frequency of Communication with Friends and Family: Twice a week    Frequency of Social Gatherings with Friends and Family: Twice a week     Attends Religious Services: More than 4 times per year    Active Member of [employer] or Organizations: No    Attends Banker Meetings: Never    Marital Status: Married  Catering manager Violence: Not At Risk (08/15/2022)   Humiliation, Afraid, Rape, and Kick questionnaire    Fear of Current or Ex-Partner: No    Emotionally Abused: No    Physically Abused: No    Sexually Abused: No   Family History  Problem Relation Age of Onset   Cancer Father    Heart disease Brother    Alcohol abuse Brother    Depression Maternal Grandmother    Depression Grandchild    Breast cancer Daughter       VITAL SIGNS BP 136/70   Pulse 70   Temp 97.6 F (36.4 C)   Resp (!) 22   Ht 5\' 5"  (1.651 m)   Wt 175 lb 9.6 oz (79.7 kg)   SpO2 94%   BMI 29.22 kg/m   Outpatient Encounter Medications as of 09/22/2023  Medication Sig   eszopiclone (LUNESTA) 2 MG TABS tablet Take 2 mg by mouth at bedtime. Take immediately before bedtime   acetaminophen (TYLENOL) 650 MG CR tablet Take 650 mg by mouth every 8 (eight) hours.   apixaban (ELIQUIS) 5 MG TABS tablet Take 1 tablet (5 mg total) by mouth 2 (two) times daily.   ARIPiprazole (ABILIFY) 10 MG tablet Take 10 mg by mouth daily.   busPIRone (BUSPAR) 10 MG tablet Take 20 mg by mouth 2 (two) times daily.   Deutetrabenazine ER (AUSTEDO XR) 30 MG TB24 Take 30 mg by mouth daily at 2 PM.   donepezil (ARICEPT) 10 MG tablet Take 10 mg by mouth at bedtime.   DULoxetine (CYMBALTA) 60 MG capsule Take 60 mg by mouth daily.   fexofenadine (ALLEGRA) 180 MG tablet Take 180 mg by mouth daily.   furosemide (LASIX) 20 MG tablet Take 20 mg by mouth.   Magnesium Hydroxide (MILK OF MAGNESIA PO) Take 30 mLs by mouth as needed (constipation).   metoprolol tartrate (LOPRESSOR) 25 MG tablet Take 1 tablet (25 mg total) by mouth 2 (two) times daily.   modafinil (PROVIGIL) 200 MG tablet Take 0.5 tablets (100 mg total) by mouth daily.   rosuvastatin (CRESTOR) 5 MG tablet Take  5 mg by mouth at bedtime.   senna (SENOKOT) 8.6 MG TABS tablet Take 1 tablet (8.6 mg total) by mouth 2 (two) times daily.   SUMAtriptan (IMITREX) 100 MG tablet Take 100 mg by mouth every  2 (two) hours as needed for migraine or headache. May repeat in 2 hours if headache persists or recurs.   UNABLE TO FIND Diet: Regular   Vitamins A & D (VITAMIN A & D) ointment Apply 1 Application topically 2 (two) times daily. To left heel   ZINC OXIDE EX Apply 1 Application topically See admin instructions. every shift and prn Special Instructions: Apply to sacrum, bilateral buttocks and coccyx q shift for redness.   [DISCONTINUED] eszopiclone (LUNESTA) 1 MG TABS tablet Take 1 tablet (1 mg total) by mouth at bedtime. Take immediately before bedtime   No facility-administered encounter medications on file as of 09/22/2023.     SIGNIFICANT DIAGNOSTIC EXAMS  LABS REVIEWED PREVIOUS   10-11-22: glucose 87; bun 16; creat 0.59; k+ 3.7; na++ 137; ca 9.4; gfr >60 10-17-22: glucose 98; bun 17; creat 0.56; k+ 4.1; na++ 138; ca 9.4; gfr >60 11-14-22: hepatitis C nr 02-28-23: wbc 7.6; hgb 12.1; hct 375; mcv 100.5 plt 216   03-24-23: wbc 8.2; hgb 12.6; hct 39.5; mcv 101.1 plt 201; hgb A1c 5.4 chol 197; ldl 125; hdl 91; hdl 54  06-26-23: wbc 6.1; hgb 11.7; hct 36.2; mcv 98.4 plt 160; glucose 97; bun 14; creat 0.57; k+ 3.9; na++ 135; ca 8.7; gfr >60 d-dimer 0.28  CRP 3.0  NO NEW LABS.   Review of Systems  Constitutional:  Negative for malaise/fatigue.  Respiratory:  Negative for cough and shortness of breath.   Cardiovascular:  Negative for chest pain, palpitations and leg swelling.  Gastrointestinal:  Negative for abdominal pain, constipation and heartburn.  Musculoskeletal:  Negative for back pain, joint pain and myalgias.  Skin: Negative.   Neurological:  Negative for dizziness.  Psychiatric/Behavioral:  Positive for depression. [place] patient is not nervous/anxious.     Physical Exam Constitutional:      General:  She is not in acute distress.    Appearance: She is well-developed. She is not diaphoretic.  Neck:     Thyroid: No thyromegaly.  Cardiovascular:     Rate and Rhythm: Normal rate and regular rhythm.     Pulses: Normal pulses.     Heart sounds: Murmur heard.  Pulmonary:     Effort: Pulmonary effort is normal. No respiratory distress.     Breath sounds: Normal breath sounds.  Abdominal:     General: Bowel sounds are normal. There is no distension.     Palpations: Abdomen is soft.     Tenderness: There is no abdominal tenderness.  Musculoskeletal:     Cervical back: Neck supple.     Right lower leg: No edema.     Left lower leg: No edema.     Comments: Has abnormal facial movements and hand tremors      10-14-22: SLUMS 18/30  Lymphadenopathy:     Cervical: No cervical adenopathy.  Skin:    General: Skin is warm and dry.  Neurological:     Mental Status: She is alert. Mental status is at baseline.  Psychiatric:        Mood and Affect: Mood normal.       ASSESSMENT/ PLAN:  TODAY  History of stroke Major depression with psychotic features:   Will lower her austedo to 24 mg daily and will monitor her status.    Synthia Innocent NP [facility]  call [phone]

## 2023-09-23 ENCOUNTER — Non-Acute Institutional Stay (SKILLED_NURSING_FACILITY): Payer: PPO | Admitting: Adult Health

## 2023-09-23 ENCOUNTER — Encounter: Payer: Self-pay | Admitting: Adult Health

## 2023-09-23 DIAGNOSIS — M25511 Pain in right shoulder: Secondary | ICD-10-CM

## 2023-09-23 DIAGNOSIS — F411 Generalized anxiety disorder: Secondary | ICD-10-CM | POA: Diagnosis not present

## 2023-09-23 DIAGNOSIS — F332 Major depressive disorder, recurrent severe without psychotic features: Secondary | ICD-10-CM | POA: Diagnosis not present

## 2023-09-23 DIAGNOSIS — G2401 Drug induced subacute dyskinesia: Secondary | ICD-10-CM | POA: Diagnosis not present

## 2023-09-23 DIAGNOSIS — F5105 Insomnia due to other mental disorder: Secondary | ICD-10-CM | POA: Diagnosis not present

## 2023-09-23 NOTE — Progress Notes (Unsigned)
Location:  [facility] Room Number: 101 Place of Service:  SNF (31)   CODE STATUS: full   Allergies  Allergen Reactions   Ticlid [Ticlopidine] Shortness Of Breath   Nsaids Other (See Comments)    Bleeding ulcer   Prednisone Other (See Comments)    Makes her stomach hurt--knows this isn't an allergy but doesn't want to take it    Trazodone And Nefazodone     hjallucinations   Monistat [Miconazole] Swelling and Rash   Myrbetriq Theodosia Paling Er] Rash    See 02/27/2023   Sulfa Antibiotics Rash    Chief Complaint  Patient presents with   Acute Visit    Right shoulder pain     HPI:  She is complaining of pain in her right shoulder. She states that she pulled on her right shoulder during therapy yesterday. There is tenderness to palpation present. Has some limitations in range of motion. She is not willing to take prednisone; is preferring to go to orthopedics for an injection.   Past Medical History:  Diagnosis Date   Anemia    Arthritis    Asthma    Atrial fibrillation (HCC)    COVID-19 08/05/2022   Depression    H/O head injury    Hearing loss    Heart murmur    can be heard at times, Doctor said not to worry   History of kidney stones    History of stomach ulcers    Migraine    Pneumonia    Psittacosis    Stroke (HCC)    Weakness of neck 03/02/2013    Past Surgical History:  Procedure Laterality Date   ANKLE FRACTURE SURGERY Left    APPENDECTOMY     BREAST LUMPECTOMY     CESAREAN SECTION     x 3   I & D KNEE WITH POLY EXCHANGE Left 08/16/2022   Procedure: KNEE POLY EXCHANGE;  Surgeon: Bjorn Pippin, MD;  Location: [facility] OR;  Service: Orthopedics;  Laterality: Left;   KNEE ARTHROSCOPY WITH PATELLAR TENDON REPAIR Left 08/16/2022   Procedure: IRRIGATION AND DEBRIDEMENT LEFT KNEE REVISION WITH PATELLAR TENDON REPAIR and poly exchange;  Surgeon: Bjorn Pippin, MD;  Location: [facility] OR;  Service: Orthopedics;  Laterality: Left;   PARTIAL  HYSTERECTOMY     ROTATOR CUFF REPAIR Right    TOTAL KNEE ARTHROPLASTY Left 07/22/2022   Procedure: TOTAL KNEE ARTHROPLASTY;  Surgeon: Joen Laura, MD;  Location: [facility] ORS;  Service: Orthopedics;  Laterality: Left;    Social History   Socioeconomic History   Marital status: Married    Spouse name: Not on file   Number of children: 3   Years of education: Not on file   Highest education level: Not on file  Occupational History   Occupation: retired  Tobacco Use   Smoking status: Never   Smokeless tobacco: Never  Vaping Use   Vaping status: Never Used  Substance and Sexual Activity   Alcohol use: Not Currently    Comment: occ beer   Drug use: No   Sexual activity: Yes    Birth control/protection: Surgical    Comment: hyst  Other Topics Concern   Not on file  Social History Narrative   Not on file   Social Determinants of Health   Financial Resource Strain: Medium Risk (01/30/2021)   Overall Financial Resource Strain (CARDIA)    Difficulty of Paying Living Expenses: Somewhat hard  Food Insecurity: No Food Insecurity (08/15/2022)  Hunger Vital Sign    Worried About Running Out of Food in the Last Year: Never true    Ran Out of Food in the Last Year: Never true  Transportation Needs: No Transportation Needs (08/15/2022)   PRAPARE - Administrator, Civil Service (Medical): No    Lack of Transportation (Non-Medical): No  Physical Activity: Insufficiently Active (01/30/2021)   Exercise Vital Sign    Days of Exercise per Week: 2 days    Minutes of Exercise per Session: 10 min  Stress: No Stress Concern Present (01/30/2021)   [employer] of Occupational Health - Occupational Stress Questionnaire    Feeling of Stress : Only a little  Social Connections: Moderately Integrated (01/30/2021)   Social Connection and Isolation Panel [NHANES]    Frequency of Communication with Friends and Family: Twice a week    Frequency of Social Gatherings with Friends and  Family: Twice a week    Attends Religious Services: More than 4 times per year    Active Member of [employer] or Organizations: No    Attends Banker Meetings: Never    Marital Status: Married  Catering manager Violence: Not At Risk (08/15/2022)   Humiliation, Afraid, Rape, and Kick questionnaire    Fear of Current or Ex-Partner: No    Emotionally Abused: No    Physically Abused: No    Sexually Abused: No   Family History  Problem Relation Age of Onset   Cancer Father    Heart disease Brother    Alcohol abuse Brother    Depression Maternal Grandmother    Depression Grandchild    Breast cancer Daughter       VITAL SIGNS BP 134/68   Pulse 80   Temp 98.1 F (36.7 C)   Resp 18   Ht 5\' 5"  (1.651 m)   Wt 175 lb 9.6 oz (79.7 kg)   SpO2 94%   BMI 29.22 kg/m   Outpatient Encounter Medications as of 09/23/2023  Medication Sig   acetaminophen (TYLENOL) 650 MG CR tablet Take 650 mg by mouth every 8 (eight) hours.   apixaban (ELIQUIS) 5 MG TABS tablet Take 1 tablet (5 mg total) by mouth 2 (two) times daily.   ARIPiprazole (ABILIFY) 10 MG tablet Take 10 mg by mouth daily.   busPIRone (BUSPAR) 10 MG tablet Take 20 mg by mouth 2 (two) times daily.   Deutetrabenazine ER (AUSTEDO XR) 30 MG TB24 Take 30 mg by mouth daily at 2 PM.   donepezil (ARICEPT) 10 MG tablet Take 10 mg by mouth at bedtime.   DULoxetine (CYMBALTA) 60 MG capsule Take 60 mg by mouth daily.   eszopiclone (LUNESTA) 2 MG TABS tablet Take 2 mg by mouth at bedtime. Take immediately before bedtime   fexofenadine (ALLEGRA) 180 MG tablet Take 180 mg by mouth daily.   furosemide (LASIX) 20 MG tablet Take 20 mg by mouth.   Magnesium Hydroxide (MILK OF MAGNESIA PO) Take 30 mLs by mouth as needed (constipation).   metoprolol tartrate (LOPRESSOR) 25 MG tablet Take 1 tablet (25 mg total) by mouth 2 (two) times daily.   modafinil (PROVIGIL) 200 MG tablet Take 0.5 tablets (100 mg total) by mouth daily.   rosuvastatin  (CRESTOR) 5 MG tablet Take 5 mg by mouth at bedtime.   senna (SENOKOT) 8.6 MG TABS tablet Take 1 tablet (8.6 mg total) by mouth 2 (two) times daily.   SUMAtriptan (IMITREX) 100 MG tablet Take 100 mg by mouth every 2 (  two) hours as needed for migraine or headache. May repeat in 2 hours if headache persists or recurs.   UNABLE TO FIND Diet: Regular   Vitamins A & D (VITAMIN A & D) ointment Apply 1 Application topically 2 (two) times daily. To left heel   ZINC OXIDE EX Apply 1 Application topically See admin instructions. every shift and prn Special Instructions: Apply to sacrum, bilateral buttocks and coccyx q shift for redness.   No facility-administered encounter medications on file as of 09/23/2023.     SIGNIFICANT DIAGNOSTIC EXAMS   LABS REVIEWED PREVIOUS   10-11-22: glucose 87; bun 16; creat 0.59; k+ 3.7; na++ 137; ca 9.4; gfr >60 10-17-22: glucose 98; bun 17; creat 0.56; k+ 4.1; na++ 138; ca 9.4; gfr >60 11-14-22: hepatitis C nr 02-28-23: wbc 7.6; hgb 12.1; hct 375; mcv 100.5 plt 216   03-24-23: wbc 8.2; hgb 12.6; hct 39.5; mcv 101.1 plt 201; hgb A1c 5.4 chol 197; ldl 125; hdl 91; hdl 54  06-26-23: wbc 6.1; hgb 11.7; hct 36.2; mcv 98.4 plt 160; glucose 97; bun 14; creat 0.57; k+ 3.9; na++ 135; ca 8.7; gfr >60 d-dimer 0.28  CRP 3.0  NO NEW LABS.   Review of Systems  Constitutional:  Negative for malaise/fatigue.  Respiratory:  Negative for cough and shortness of breath.   Cardiovascular:  Negative for chest pain, palpitations and leg swelling.  Gastrointestinal:  Negative for abdominal pain, constipation and heartburn.  Musculoskeletal:  Positive for joint pain. Negative for back pain and myalgias.       Right shoulder   Skin: Negative.   Neurological:  Negative for dizziness.  Psychiatric/Behavioral:  The patient is not nervous/anxious.    Physical Exam Constitutional:      General: She is not in acute distress.    Appearance: She is well-developed. She is not diaphoretic.   Neck:     Thyroid: No thyromegaly.  Cardiovascular:     Rate and Rhythm: Normal rate and regular rhythm.     Pulses: Normal pulses.     Heart sounds: Murmur heard.  Pulmonary:     Effort: Pulmonary effort is normal. No respiratory distress.     Breath sounds: Normal breath sounds.  Abdominal:     General: Bowel sounds are normal. There is no distension.     Palpations: Abdomen is soft.     Tenderness: There is no abdominal tenderness.  Musculoskeletal:     Cervical back: Neck supple.     Right lower leg: No edema.     Left lower leg: No edema.     Comments: Right shoulder with tenderness to palpation present. Has limited range of motion present; history of rotator cuff   Lymphadenopathy:     Cervical: No cervical adenopathy.  Skin:    General: Skin is warm and dry.  Neurological:     Mental Status: She is alert. Mental status is at baseline.     Comments:  Has abnormal facial movements and hand tremors      10-14-22: SLUMS 18/30  Psychiatric:        Mood and Affect: Mood normal.      ASSESSMENT/ PLAN:  TODAY  Acute right shoulder pain: will setup an appointment with orthopedics for joint injection and will monitor her status.    Synthia Innocent NP [facility]   call [phone]

## 2023-09-24 ENCOUNTER — Encounter: Payer: Self-pay | Admitting: Psychiatry

## 2023-09-24 DIAGNOSIS — M25511 Pain in right shoulder: Secondary | ICD-10-CM | POA: Insufficient documentation

## 2023-09-30 DIAGNOSIS — M25511 Pain in right shoulder: Secondary | ICD-10-CM | POA: Diagnosis not present

## 2023-10-06 ENCOUNTER — Ambulatory Visit: Payer: PPO | Attending: Internal Medicine | Admitting: Internal Medicine

## 2023-10-06 ENCOUNTER — Encounter: Payer: Self-pay | Admitting: Internal Medicine

## 2023-10-06 VITALS — BP 132/64 | HR 50 | Ht 65.0 in | Wt 175.0 lb

## 2023-10-06 DIAGNOSIS — R5383 Other fatigue: Secondary | ICD-10-CM | POA: Insufficient documentation

## 2023-10-06 DIAGNOSIS — I4819 Other persistent atrial fibrillation: Secondary | ICD-10-CM | POA: Diagnosis not present

## 2023-10-06 DIAGNOSIS — I480 Paroxysmal atrial fibrillation: Secondary | ICD-10-CM

## 2023-10-06 MED ORDER — METOPROLOL TARTRATE 25 MG PO TABS: 12.5000 mg | ORAL_TABLET | Freq: Two times a day (BID) | ORAL | 3 refills | Status: DC

## 2023-10-06 NOTE — Patient Instructions (Signed)
Medication Instructions:  Your physician has recommended you make the following change in your medication:   -Decrease Metoprolol to 12.5 mg twice daily.   *If you need a refill on your cardiac medications before your next appointment, please call your pharmacy*   Lab Work: None If you have labs (blood work) drawn today and your tests are completely normal, you will receive your results only by: MyChart Message (if you have MyChart) OR A paper copy in the mail If you have any lab test that is abnormal or we need to change your treatment, we will call you to review the results.   Testing/Procedures: None   Follow-Up: At [facility], you and your health needs are our priority.  As part of our continuing mission to provide you with exceptional heart care, we have created designated Provider Care Teams.  These Care Teams include your primary Cardiologist (physician) and Advanced Practice Providers (APPs -  Physician Assistants and Nurse Practitioners) who all work together to provide you with the care you need, when you need it.  We recommend signing up for the patient portal called "MyChart".  Sign up information is provided on this After Visit Summary.  MyChart is used to connect with patients for Virtual Visits (Telemedicine).  Patients are able to view lab/test results, encounter notes, upcoming appointments, etc.  Non-urgent messages can be sent to your provider as well.   To learn more about what you can do with MyChart, go to [email or web address].    Your next appointment:   1 year(s)  Provider:   You may see Vishnu P Mallipeddi, MD or one of the following Advanced Practice Providers on your designated Care Team:   [place], PA-C  Jacolyn Reedy, PA-C     Other Instructions If you still experience fatigue in the next  1-2 months, please call our office for monitor placement.

## 2023-10-06 NOTE — Progress Notes (Signed)
Cardiology Office Note  Date: 10/06/2023   ID: [name], DOB 07-28-1946, MRN [id number]  PCP:  Sharee Holster, NP  Cardiologist:  Marjo Bicker, MD Electrophysiologist:  None   Reason for Office Visit: Follow-up visit of A-fib   History of Present Illness: [name] is a 77 y.o. female known to have history of stroke, new onset of A-fib during 9/23 hospitalization postsurgery, GERD, cognitive impairment/dementia presented to cardiology clinic for follow-up visit.   EKG today shows sinus bradycardia however flutter cannot be completely ruled out due to significant tremors in bilateral upper extremities.  Daughter preferred only conservative management and no procedures.  She denies having any symptoms of angina, palpitations, DOE, orthopnea, PND, dizziness, lightheadedness, syncope however endorses having fatigue for the last 3 months.  She reported that her vitamin D, iron and regular labs were within normal limits, at least she was told it was normal.  I do not have any records to review.  EKG today shows sinus bradycardia, HR 49 bpm and 50 ish on the vitals.  Wheelchair-bound, does not walk.  Past Medical History:  Diagnosis Date   Anemia    Arthritis    Asthma    Atrial fibrillation (HCC)    COVID-19 08/05/2022   Depression    H/O head injury    Hearing loss    Heart murmur    can be heard at times, Doctor said not to worry   History of kidney stones    History of stomach ulcers    Migraine    Pneumonia    Psittacosis    Stroke (HCC)    Weakness of neck 03/02/2013    Past Surgical History:  Procedure Laterality Date   ANKLE FRACTURE SURGERY Left    APPENDECTOMY     BREAST LUMPECTOMY     CESAREAN SECTION     x 3   I & D KNEE WITH POLY EXCHANGE Left 08/16/2022   Procedure: KNEE POLY EXCHANGE;  Surgeon: Bjorn Pippin, MD;  Location: [facility] OR;  Service: Orthopedics;  Laterality: Left;   KNEE ARTHROSCOPY WITH PATELLAR TENDON REPAIR Left 08/16/2022    Procedure: IRRIGATION AND DEBRIDEMENT LEFT KNEE REVISION WITH PATELLAR TENDON REPAIR and poly exchange;  Surgeon: Bjorn Pippin, MD;  Location: [facility] OR;  Service: Orthopedics;  Laterality: Left;   PARTIAL HYSTERECTOMY     ROTATOR CUFF REPAIR Right    TOTAL KNEE ARTHROPLASTY Left 07/22/2022   Procedure: TOTAL KNEE ARTHROPLASTY;  Surgeon: Joen Laura, MD;  Location: [facility];  Service: Orthopedics;  Laterality: Left;    Current Outpatient Medications  Medication Sig Dispense Refill   busPIRone (BUSPAR) 10 MG tablet Take 20 mg by mouth 2 (two) times daily.     acetaminophen (TYLENOL) 650 MG CR tablet Take 650 mg by mouth every 8 (eight) hours.     apixaban (ELIQUIS) 5 MG TABS tablet Take 1 tablet (5 mg total) by mouth 2 (two) times daily. 60 tablet    ARIPiprazole (ABILIFY) 10 MG tablet Take 10 mg by mouth daily.     Deutetrabenazine ER (AUSTEDO XR) 24 MG TB24 Take 24 mg by mouth daily at 2 PM.     donepezil (ARICEPT) 10 MG tablet Take 10 mg by mouth at bedtime.     DULoxetine (CYMBALTA) 60 MG capsule Take 60 mg by mouth daily.     eszopiclone (LUNESTA) 2 MG TABS tablet Take 2 mg by mouth at bedtime. Take immediately before bedtime  fexofenadine (ALLEGRA) 180 MG tablet Take 180 mg by mouth daily.     furosemide (LASIX) 20 MG tablet Take 20 mg by mouth.     Magnesium Hydroxide (MILK OF MAGNESIA PO) Take 30 mLs by mouth as needed (constipation).     metoprolol tartrate (LOPRESSOR) 25 MG tablet Take 1 tablet (25 mg total) by mouth 2 (two) times daily. 180 tablet 3   modafinil (PROVIGIL) 200 MG tablet Take 0.5 tablets (100 mg total) by mouth daily. 15 tablet 0   rosuvastatin (CRESTOR) 5 MG tablet Take 5 mg by mouth at bedtime.     senna (SENOKOT) 8.6 MG TABS tablet Take 1 tablet (8.6 mg total) by mouth 2 (two) times daily. 120 tablet 0   SUMAtriptan (IMITREX) 100 MG tablet Take 100 mg by mouth every 2 (two) hours as needed for migraine or headache. May repeat in 2 hours if headache persists  or recurs.     UNABLE TO FIND Diet: Regular     Vitamins A & D (VITAMIN A & D) ointment Apply 1 Application topically 2 (two) times daily. To left heel     ZINC OXIDE EX Apply 1 Application topically See admin instructions. every shift and prn Special Instructions: Apply to sacrum, bilateral buttocks and coccyx q shift for redness.     No current facility-administered medications for this visit.   Allergies:  Ticlid [ticlopidine], Nsaids, Prednisone, Trazodone and nefazodone, Monistat [miconazole], Myrbetriq [mirabegron er], and Sulfa antibiotics   Social History: The patient  reports that she has never smoked. She has never used smokeless tobacco. She reports that she does not currently use alcohol. She reports that she does not use drugs.   Family History: The patient's family history includes Alcohol abuse in her brother; Breast cancer in her daughter; Cancer in her father; Depression in her grandchild and maternal grandmother; Heart disease in her brother.   ROS:  Please see the history of present illness. Otherwise, complete review of systems is positive for none.  All other systems are reviewed and negative.   Physical Exam: VS:  BP 132/64 (BP Location: Right Arm)   Pulse (!) 50   Ht 5\' 5"  (1.651 m)   Wt 175 lb (79.4 kg)   SpO2 97%   BMI 29.12 kg/m , BMI Body mass index is 29.12 kg/m.  Wt Readings from Last 3 Encounters:  10/06/23 175 lb (79.4 kg)  09/23/23 175 lb 9.6 oz (79.7 kg)  09/22/23 175 lb 9.6 oz (79.7 kg)    General: Patient appears comfortable at rest. HEENT: Conjunctiva and lids normal, oropharynx clear with moist mucosa. Neck: Supple, no elevated JVP or carotid bruits, no thyromegaly. Lungs: Clear to auscultation, nonlabored breathing at rest. Cardiac: Regular rate and rhythm, no S3 or significant systolic murmur, no pericardial rub. Abdomen: Soft, nontender, no hepatomegaly, bowel sounds present, no guarding or rebound. Extremities: 1+ pitting edema, distal  pulses 2+. Skin: Warm and dry. Musculoskeletal: No kyphosis. Neuropsychiatric: Alert and oriented x3, affect grossly appropriate.  Recent Labwork: 06/26/2023: BUN 14; Creatinine, Ser 0.57; Hemoglobin 11.7; Platelets 160; Potassium 3.9; Sodium 135     Component Value Date/Time   CHOL 197 03/24/2023 0555   TRIG 91 03/24/2023 0555   HDL 54 03/24/2023 0555   CHOLHDL 3.6 03/24/2023 0555   VLDL 18 03/24/2023 0555   LDLCALC 125 (H) 03/24/2023 0555    Other Studies Reviewed Today: Echo 10/23 LVEF 55 to 60% LV diastolic parameters are indeterminate RV systolic function is normal LA  size is moderately dilated Trivial MR  Assessment and Plan: Patient is a 77 year old F known to have new onset of postop A-fib during 9/23 hospitalization, history of stroke presented to cardiology clinic for follow-up visit.    Paroxysmal A-fib Fatigue History of CVA HLD, not at goal   -Patient was diagnosed with postop A-fib during 07/2022 hospitalization. Daughter preferred medical management and not TEE guided DCCV.  EKG today showed sinus bradycardia however flutter cannot be completely ruled out due to significant tremors in both upper extremities.  HR between 49 and 50 bpm, she is complaining of fatigue for the last 3 months, will decrease the dose of metoprolol tartrate from 25 to 12.5 mg twice daily.  If she still continues to have fatigue and low heart rates, metoprolol will need to be discontinued.  She will benefit from event monitor if she has persistent fatigue and bradycardia despite not being on rate controlling agents.  Fortunately she does not have any dizziness or lightheadedness or syncope.  She reported that vitamin D, iron and TSH were within normal limits but I do not have the results to review.  Continue Eliquis 5 mg twice daily, no bleeding complications and no falls in the last 6 months.  Currently on rosuvastatin 5 mg nightly which we will continue.  Goal LDL less than  70.     Medication Adjustments/Labs and Tests Ordered: Current medicines are reviewed at length with the patient today.  Concerns regarding medicines are outlined above.   Tests Ordered: Orders Placed This Encounter  Procedures   EKG 12-Lead    Medication Changes: No orders of the defined types were placed in this encounter.   Disposition:  Follow up  1 year  Signed, Bobetta Korf Verne Spurr, MD, 10/06/2023 9:31 AM    [facility] at [facility] [address] [place], [place] [address]

## 2023-10-07 ENCOUNTER — Other Ambulatory Visit: Payer: Self-pay | Admitting: Adult Health

## 2023-10-07 MED ORDER — MODAFINIL 200 MG PO TABS: 100.0000 mg | ORAL_TABLET | Freq: Every day | ORAL | 0 refills | Status: DC

## 2023-10-14 ENCOUNTER — Encounter: Payer: Self-pay | Admitting: Adult Health

## 2023-10-14 ENCOUNTER — Non-Acute Institutional Stay (SKILLED_NURSING_FACILITY): Payer: Self-pay | Admitting: Adult Health

## 2023-10-14 DIAGNOSIS — G894 Chronic pain syndrome: Secondary | ICD-10-CM

## 2023-10-14 DIAGNOSIS — R600 Localized edema: Secondary | ICD-10-CM | POA: Diagnosis not present

## 2023-10-14 DIAGNOSIS — D649 Anemia, unspecified: Secondary | ICD-10-CM | POA: Diagnosis not present

## 2023-10-14 NOTE — Progress Notes (Unsigned)
Smith:  [facility] Room Number: 101 Place of Service:  SNF (31)   CODE STATUS: full code   Allergies  Allergen Reactions   Ticlid [Ticlopidine] Shortness Of Breath   Nsaids Other (See Comments)    Bleeding ulcer   Prednisone Other (See Comments)    Makes her stomach hurt--knows this isn't an allergy but doesn't want to take it    Trazodone And Nefazodone     hjallucinations   Monistat [Miconazole] Swelling and Rash   Myrbetriq Theodosia Paling Er] Rash    See 02/27/2023   Sulfa Antibiotics Rash    Chief Complaint  Patient presents with   Medical Management of Chronic Issues         Chronic pain syndrome. Bilateral lower extremity edema:  Chronic anemia:     HPI:  She is a 77 year old long term resident of this facility being seen for Carmen management of her chronic illnesses: Chronic pain syndrome. Bilateral lower extremity edema:  Chronic anemia. Her pain is presently being managed. She is without complaints of lower extremity swelling. Her mood state has improved since lowering [place]   Past Medical History:  Diagnosis Date   Anemia    Arthritis    Asthma    Atrial fibrillation (HCC)    COVID-19 08/05/2022   Depression    H/O head injury    Hearing loss    Heart murmur    can be heard at times, Doctor said not to worry   History of kidney stones    History of stomach ulcers    Migraine    Pneumonia    Psittacosis    Stroke (HCC)    Weakness of neck 03/02/2013    Past Surgical History:  Procedure Laterality Date   ANKLE FRACTURE SURGERY Left    APPENDECTOMY     BREAST LUMPECTOMY     CESAREAN SECTION     x 3   I & D KNEE WITH POLY EXCHANGE Left 08/16/2022   Procedure: KNEE POLY EXCHANGE;  Surgeon: Bjorn Pippin, MD;  [facility]: [facility] OR;  Service: Orthopedics;  Laterality: Left;   KNEE ARTHROSCOPY WITH PATELLAR TENDON REPAIR Left 08/16/2022   Procedure: IRRIGATION AND DEBRIDEMENT LEFT KNEE REVISION WITH PATELLAR TENDON REPAIR and  poly exchange;  Surgeon: Bjorn Pippin, MD;  [facility]: [facility];  Service: Orthopedics;  Laterality: Left;   PARTIAL HYSTERECTOMY     ROTATOR CUFF REPAIR Right    TOTAL KNEE ARTHROPLASTY Left 07/22/2022   Procedure: TOTAL KNEE ARTHROPLASTY;  Surgeon: Joen Laura, MD;  [facility]: [facility];  Service: Orthopedics;  Laterality: Left;    Social History   Socioeconomic History   Marital status: Married    Spouse name: Not on file   Number of children: 3   Years of education: Not on file   Highest education level: Not on file  Occupational History   Occupation: retired  Tobacco Use   Smoking status: Never   Smokeless tobacco: Never  Vaping Use   Vaping status: Never Used  Substance and Sexual Activity   Alcohol use: Not Currently    Comment: occ beer   Drug use: No   Sexual activity: Yes    Birth control/protection: Surgical    Comment: hyst  Other Topics Concern   Not on file  Social History Narrative   Not on file   Social Determinants of Health   Financial Resource Strain: Medium Risk (01/30/2021)   Overall Financial  Resource Strain (CARDIA)    Difficulty of Paying Living Expenses: Somewhat hard  Food Insecurity: No Food Insecurity (08/15/2022)   Hunger Vital Sign    Worried About Running Out of Food in Carmen Last Year: Never true    Ran Out of Food in Carmen Last Year: Never true  Transportation Needs: No Transportation Needs (08/15/2022)   PRAPARE - Administrator, Civil Service (Medical): No    Lack of Transportation (Non-Medical): No  Physical Activity: Insufficiently Active (01/30/2021)   Exercise Vital Sign    Days of Exercise per Week: 2 days    Minutes of Exercise per Session: 10 min  Stress: No Stress Concern Present (01/30/2021)   [employer] of Occupational Health - Occupational Stress Questionnaire    Feeling of Stress : Only a little  Social Connections: Moderately Integrated (01/30/2021)   Social Connection and Isolation Panel [NHANES]     Frequency of Communication with Friends and Family: Twice a week    Frequency of Social Gatherings with Friends and Family: Twice a week    Attends Religious Services: More than 4 times per year    Active Member of [employer] or Organizations: No    Attends Banker Meetings: Never    Marital Status: Married  Catering manager Violence: Not At Risk (08/15/2022)   Humiliation, Afraid, Rape, and Kick questionnaire    Fear of Current or Ex-Partner: No    Emotionally Abused: No    Physically Abused: No    Sexually Abused: No   Family History  Problem Relation Age of Onset   Cancer Father    Heart disease Brother    Alcohol abuse Brother    Depression Maternal Grandmother    Depression Grandchild    Breast cancer Daughter       VITAL SIGNS BP 116/70   Pulse 80   Temp 97.8 F (36.6 C)   Resp 18   Ht 5\' 5"  (1.651 m)   Wt 175 lb 9.6 oz (79.7 kg)   SpO2 97%   BMI 29.22 kg/m   Outpatient Encounter Medications Carmen of 10/14/2023  Medication Sig   acetaminophen (TYLENOL) 650 MG CR tablet Take 650 mg by mouth every 8 (eight) hours.   apixaban (ELIQUIS) 5 MG TABS tablet Take 1 tablet (5 mg total) by mouth 2 (two) times daily.   ARIPiprazole (ABILIFY) 10 MG tablet Take 10 mg by mouth daily.   busPIRone (BUSPAR) 10 MG tablet Take 20 mg by mouth 2 (two) times daily.   Deutetrabenazine ER (AUSTEDO XR) 24 MG TB24 Take 24 mg by mouth daily at 2 PM.   donepezil (ARICEPT) 10 MG tablet Take 10 mg by mouth at bedtime.   DULoxetine (CYMBALTA) 60 MG capsule Take 60 mg by mouth daily.   eszopiclone (LUNESTA) 2 MG TABS tablet Take 2 mg by mouth at bedtime. Take immediately before bedtime   fexofenadine (ALLEGRA) 180 MG tablet Take 180 mg by mouth daily.   furosemide (LASIX) 20 MG tablet Take 20 mg by mouth.   Magnesium Hydroxide (MILK OF MAGNESIA PO) Take 30 mLs by mouth Carmen needed (constipation).   metoprolol tartrate (LOPRESSOR) 25 MG tablet Take 0.5 tablets (12.5 mg total) by mouth 2 (two)  times daily.   modafinil (PROVIGIL) 200 MG tablet Take 0.5 tablets (100 mg total) by mouth daily.   rosuvastatin (CRESTOR) 5 MG tablet Take 5 mg by mouth at bedtime.   senna (SENOKOT) 8.6 MG TABS tablet Take 1 tablet (8.6  mg total) by mouth 2 (two) times daily.   SUMAtriptan (IMITREX) 100 MG tablet Take 100 mg by mouth every 2 (two) hours Carmen needed for migraine or headache. May repeat in 2 hours if headache persists or recurs.   UNABLE TO FIND Diet: Regular   Vitamins A & D (VITAMIN A & D) ointment Apply 1 Application topically 2 (two) times daily. To left heel   ZINC OXIDE EX Apply 1 Application topically See admin instructions. every shift and prn Special Instructions: Apply to sacrum, bilateral buttocks and coccyx q shift for redness.   No facility-administered encounter medications on file Carmen of 10/14/2023.     SIGNIFICANT DIAGNOSTIC EXAMS  LABS REVIEWED PREVIOUS   10-11-22: glucose 87; bun 16; creat 0.59; k+ 3.7; na++ 137; ca 9.4; gfr >60 10-17-22: glucose 98; bun 17; creat 0.56; k+ 4.1; na++ 138; ca 9.4; gfr >60 11-14-22: hepatitis C nr 02-28-23: wbc 7.6; hgb 12.1; hct 375; mcv 100.5 plt 216   03-24-23: wbc 8.2; hgb 12.6; hct 39.5; mcv 101.1 plt 201; hgb A1c 5.4 chol 197; ldl 125; hdl 91; hdl 54  06-26-23: wbc 6.1; hgb 11.7; hct 36.2; mcv 98.4 plt 160; glucose 97; bun 14; creat 0.57; k+ 3.9; na++ 135; ca 8.7; gfr >60 d-dimer 0.28  CRP 3.0  NO NEW LABS.   Review of Systems  Constitutional:  Negative for malaise/fatigue.  Respiratory:  Negative for cough and shortness of breath.   Cardiovascular:  Negative for chest pain, palpitations and leg swelling.  Gastrointestinal:  Negative for abdominal pain, constipation and heartburn.  Musculoskeletal:  Negative for back pain, joint pain and myalgias.  Skin: Negative.   Neurological:  Negative for dizziness.  Psychiatric/Behavioral:  Carmen patient is not nervous/anxious.    Physical Exam Constitutional:      General: She is not in acute  distress.    Appearance: She is well-developed. She is not diaphoretic.  Neck:     Thyroid: No thyromegaly.  Cardiovascular:     Rate and Rhythm: Normal rate and regular rhythm.     Pulses: Normal pulses.     Heart sounds: Murmur heard.  Pulmonary:     Effort: Pulmonary effort is normal. No respiratory distress.     Breath sounds: Normal breath sounds.  Abdominal:     General: Bowel sounds are normal. There is no distension.     Palpations: Abdomen is soft.     Tenderness: There is no abdominal tenderness.  Musculoskeletal:        General: Normal range of motion.     Cervical back: Neck supple.     Right lower leg: No edema.     Left lower leg: No edema.  Lymphadenopathy:     Cervical: No cervical adenopathy.  Skin:    General: Skin is warm and dry.  Neurological:     Mental Status: She is alert. Mental status is at baseline.     Comments: Has abnormal facial movements and hand tremors; is less today       10-14-22: SLUMS 18/30   Psychiatric:        Mood and Affect: Mood normal.      ASSESSMENT/ PLAN:  TODAY  Chronic pain syndrome/periprosthetic fracture around internal prosthetic left knee joint sequela: is on cymbalta 60 mg daily and tylenol 650 mg nightly   2. Bilateral lower extremity edema: will continue lasix 20 mg daily   3. Chronic anemia: hgb 12.1   PREVIOUS   4. Chronic insomnia: will continue  lunesta 2 mg nightly   5.  Psychosis in elderly/major depression with psychotic features: will continue abilify 10 mg daily cymbalta 60 mg daily buspar 20 mg twice daily  6. Somnolence: will continue provigil 100 mg daily   7. Protein calorie malnutrition: will continue supplements Carmen directed  8. Urinary incontinence: is off myrbetriq due to rash.   9. Longstanding persistent atrial fibrillation/history of DVT: heart rate is stable will continue eliquis 5 mg twice daily and is taking lopressor 25 mg twice daily for rate control  10. Migraine without status  migrainosus, nonintractable unspecified type:  11.  Uncomplicated asthma unspecified asthma severity unspecified: unspecified whether persistent: is on allegra 180 mg daily   12. Chronic constipation: will continue senna s twice daily   13. Vascular dementia without behavioral disturbance/neurocognitive deficit: weight is 175 pounds; will continue aricept 10 mg daily   14. Tardive dyskinesia: does continue to have abnormal facial and hand movements; has failed ingrezza therapy; will continue auestuda xr 24 mg daily; this dose was lowered to due depressive thoughts.  if this is unsuccessful will need to try  tetrabenazine.     Synthia Innocent NP [facility]   call [phone]

## 2023-10-16 ENCOUNTER — Other Ambulatory Visit (HOSPITAL_COMMUNITY)
Admission: RE | Admit: 2023-10-16 | Discharge: 2023-10-16 | Disposition: A | Discharge status: Home / Self Care | Payer: PPO | Source: Skilled Nursing Facility | Attending: Adult Health | Admitting: Adult Health

## 2023-10-16 DIAGNOSIS — E785 Hyperlipidemia, unspecified: Secondary | ICD-10-CM | POA: Insufficient documentation

## 2023-10-16 LAB — LIPID PANEL
Cholesterol: 136 mg/dL (ref 0–200)
HDL: 56 mg/dL (ref >40)
LDL Cholesterol: 66 mg/dL (ref 0–99)
Total CHOL/HDL Ratio: 2.4 {ratio}
Triglycerides: 70 mg/dL (ref <150)
VLDL: 14 mg/dL (ref 0–40)

## 2023-10-24 ENCOUNTER — Non-Acute Institutional Stay (SKILLED_NURSING_FACILITY): Payer: PPO | Admitting: Adult Health

## 2023-10-24 ENCOUNTER — Encounter: Payer: Self-pay | Admitting: Adult Health

## 2023-10-24 ENCOUNTER — Other Ambulatory Visit: Payer: Self-pay | Admitting: Adult Health

## 2023-10-24 DIAGNOSIS — I480 Paroxysmal atrial fibrillation: Secondary | ICD-10-CM

## 2023-10-24 DIAGNOSIS — F323 Major depressive disorder, single episode, severe with psychotic features: Secondary | ICD-10-CM

## 2023-10-24 DIAGNOSIS — F0150 Vascular dementia without behavioral disturbance: Secondary | ICD-10-CM

## 2023-10-24 MED ORDER — ESZOPICLONE 2 MG PO TABS: 2.0000 mg | ORAL_TABLET | Freq: Every day | ORAL | 0 refills | Status: DC

## 2023-10-24 NOTE — Progress Notes (Signed)
Location:  [facility] Room Number: 101 Place of Service:  SNF (31)   CODE STATUS: full   Allergies  Allergen Reactions   Ticlid [Ticlopidine] Shortness Of Breath   Nsaids Other (See Comments)    Bleeding ulcer   Prednisone Other (See Comments)    Makes her stomach hurt--knows this isn't an allergy but doesn't want to take it    Trazodone And Nefazodone     hjallucinations   Monistat [Miconazole] Swelling and Rash   Myrbetriq Theodosia Paling Er] Rash    See 02/27/2023   Sulfa Antibiotics Rash    Chief Complaint  Patient presents with   Acute Visit    Care plan meeting     HPI:  We have come together for her care plan meeting. Family present . BIMS 13/15 mood 2/30:decreased energy; trouble concentrating; not sleeping well; nervous at times; depression. Non ambulatory uses wheelchair; no falls.  She requires moderate assist with her adls. She is frequently incontinent of bladder and bowel. Dietary; feeds self; regular diet; weight is 173 pounds appetite 76-100%. Therapy: none at this time. She has and adapted shoe; will need to be seen by therapy. Therapy will need to inform her regarding her ability to ambulate. Activities: occasional group; personal activities. She will continue to be followed for her chronic illnesses including:   PAF (paroxysmal atrial fibrillation)  Vascular dementia without behavioral disturbance   Major depression with psychotic features  Past Medical History:  Diagnosis Date   Anemia    Arthritis    Asthma    Atrial fibrillation (HCC)    COVID-19 08/05/2022   Depression    H/O head injury    Hearing loss    Heart murmur    can be heard at times, Doctor said not to worry   History of kidney stones    History of stomach ulcers    Migraine    Pneumonia    Psittacosis    Stroke (HCC)    Weakness of neck 03/02/2013    Past Surgical History:  Procedure Laterality Date   ANKLE FRACTURE SURGERY Left    APPENDECTOMY      BREAST LUMPECTOMY     CESAREAN SECTION     x 3   I & D KNEE WITH POLY EXCHANGE Left 08/16/2022   Procedure: KNEE POLY EXCHANGE;  Surgeon: Bjorn Pippin, MD;  Location: [facility] OR;  Service: Orthopedics;  Laterality: Left;   KNEE ARTHROSCOPY WITH PATELLAR TENDON REPAIR Left 08/16/2022   Procedure: IRRIGATION AND DEBRIDEMENT LEFT KNEE REVISION WITH PATELLAR TENDON REPAIR and poly exchange;  Surgeon: Bjorn Pippin, MD;  Location: [facility] OR;  Service: Orthopedics;  Laterality: Left;   PARTIAL HYSTERECTOMY     ROTATOR CUFF REPAIR Right    TOTAL KNEE ARTHROPLASTY Left 07/22/2022   Procedure: TOTAL KNEE ARTHROPLASTY;  Surgeon: Joen Laura, MD;  Location: [facility];  Service: Orthopedics;  Laterality: Left;    Social History   Socioeconomic History   Marital status: Married    Spouse name: Not on file   Number of children: 3   Years of education: Not on file   Highest education level: Not on file  Occupational History   Occupation: retired  Tobacco Use   Smoking status: Never   Smokeless tobacco: Never  Vaping Use   Vaping status: Never Used  Substance and Sexual Activity   Alcohol use: Not Currently    Comment: occ beer   Drug use: No  Sexual activity: Yes    Birth control/protection: Surgical    Comment: hyst  Other Topics Concern   Not on file  Social History Narrative   Not on file   Social Drivers of Health   Financial Resource Strain: Medium Risk (01/30/2021)   Overall Financial Resource Strain (CARDIA)    Difficulty of Paying Living Expenses: Somewhat hard  Food Insecurity: No Food Insecurity (08/15/2022)   Hunger Vital Sign    Worried About Running Out of Food in the Last Year: Never true    Ran Out of Food in the Last Year: Never true  Transportation Needs: No Transportation Needs (08/15/2022)   PRAPARE - Administrator, Civil Service (Medical): No    Lack of Transportation (Non-Medical): No  Physical Activity: Insufficiently Active (01/30/2021)   Exercise  Vital Sign    Days of Exercise per Week: 2 days    Minutes of Exercise per Session: 10 min  Stress: No Stress Concern Present (01/30/2021)   [employer] of Occupational Health - Occupational Stress Questionnaire    Feeling of Stress : Only a little  Social Connections: Moderately Integrated (01/30/2021)   Social Connection and Isolation Panel [NHANES]    Frequency of Communication with Friends and Family: Twice a week    Frequency of Social Gatherings with Friends and Family: Twice a week    Attends Religious Services: More than 4 times per year    Active Member of [employer] or Organizations: No    Attends Banker Meetings: Never    Marital Status: Married  Catering manager Violence: Not At Risk (08/15/2022)   Humiliation, Afraid, Rape, and Kick questionnaire    Fear of Current or Ex-Partner: No    Emotionally Abused: No    Physically Abused: No    Sexually Abused: No   Family History  Problem Relation Age of Onset   Cancer Father    Heart disease Brother    Alcohol abuse Brother    Depression Maternal Grandmother    Depression Grandchild    Breast cancer Daughter       VITAL SIGNS BP (!) 120/57   Temp 97.7 F (36.5 C)   Resp (!) 22   Ht 5' (1.524 m)   Wt 173 lb (78.5 kg)   SpO2 95%   BMI 33.79 kg/m   Outpatient Encounter Medications as of 10/24/2023  Medication Sig   acetaminophen (TYLENOL) 650 MG CR tablet Take 650 mg by mouth every 8 (eight) hours.   apixaban (ELIQUIS) 5 MG TABS tablet Take 1 tablet (5 mg total) by mouth 2 (two) times daily.   ARIPiprazole (ABILIFY) 10 MG tablet Take 10 mg by mouth daily.   busPIRone (BUSPAR) 10 MG tablet Take 20 mg by mouth 2 (two) times daily.   Deutetrabenazine ER (AUSTEDO XR) 24 MG TB24 Take 24 mg by mouth daily at 2 PM.   donepezil (ARICEPT) 10 MG tablet Take 10 mg by mouth at bedtime.   DULoxetine (CYMBALTA) 60 MG capsule Take 60 mg by mouth daily.   eszopiclone (LUNESTA) 2 MG TABS tablet Take 2 mg by mouth at  bedtime. Take immediately before bedtime   fexofenadine (ALLEGRA) 180 MG tablet Take 180 mg by mouth daily.   furosemide (LASIX) 20 MG tablet Take 20 mg by mouth.   Magnesium Hydroxide (MILK OF MAGNESIA PO) Take 30 mLs by mouth as needed (constipation).   metoprolol tartrate (LOPRESSOR) 25 MG tablet Take 0.5 tablets (12.5 mg total) by mouth 2 (  two) times daily.   modafinil (PROVIGIL) 200 MG tablet Take 0.5 tablets (100 mg total) by mouth daily.   rosuvastatin (CRESTOR) 5 MG tablet Take 5 mg by mouth at bedtime.   senna (SENOKOT) 8.6 MG TABS tablet Take 1 tablet (8.6 mg total) by mouth 2 (two) times daily.   SUMAtriptan (IMITREX) 100 MG tablet Take 100 mg by mouth every 2 (two) hours as needed for migraine or headache. May repeat in 2 hours if headache persists or recurs.   UNABLE TO FIND Diet: Regular   Vitamins A & D (VITAMIN A & D) ointment Apply 1 Application topically 2 (two) times daily. To left heel   ZINC OXIDE EX Apply 1 Application topically See admin instructions. every shift and prn Special Instructions: Apply to sacrum, bilateral buttocks and coccyx q shift for redness.   No facility-administered encounter medications on file as of 10/24/2023.     SIGNIFICANT DIAGNOSTIC EXAMS  LABS REVIEWED PREVIOUS   10-11-22: glucose 87; bun 16; creat 0.59; k+ 3.7; na++ 137; ca 9.4; gfr >60 10-17-22: glucose 98; bun 17; creat 0.56; k+ 4.1; na++ 138; ca 9.4; gfr >60 11-14-22: hepatitis C nr 02-28-23: wbc 7.6; hgb 12.1; hct 375; mcv 100.5 plt 216   03-24-23: wbc 8.2; hgb 12.6; hct 39.5; mcv 101.1 plt 201; hgb A1c 5.4 chol 197; ldl 125; hdl 91; hdl 54  06-26-23: wbc 6.1; hgb 11.7; hct 36.2; mcv 98.4 plt 160; glucose 97; bun 14; creat 0.57; k+ 3.9; na++ 135; ca 8.7; gfr >60 d-dimer 0.28  CRP 3.0  NO NEW LABS.   Review of Systems  Constitutional:  Negative for malaise/fatigue.  Respiratory:  Negative for cough and shortness of breath.   Cardiovascular:  Negative for chest pain, palpitations and  leg swelling.  Gastrointestinal:  Negative for abdominal pain, constipation and heartburn.  Musculoskeletal:  Negative for back pain, joint pain and myalgias.  Skin: Negative.   Neurological:  Negative for dizziness.  Psychiatric/Behavioral:  The patient is nervous/anxious.    Physical Exam Constitutional:      General: She is not in acute distress.    Appearance: She is well-developed. She is obese. She is not diaphoretic.  Neck:     Thyroid: No thyromegaly.  Cardiovascular:     Rate and Rhythm: Normal rate and regular rhythm.     Pulses: Normal pulses.     Heart sounds: Murmur heard.  Pulmonary:     Effort: Pulmonary effort is normal. No respiratory distress.     Breath sounds: Normal breath sounds.  Abdominal:     General: Bowel sounds are normal. There is no distension.     Palpations: Abdomen is soft.     Tenderness: There is no abdominal tenderness.  Musculoskeletal:        General: Normal range of motion.     Cervical back: Neck supple.     Right lower leg: No edema.     Left lower leg: No edema.  Lymphadenopathy:     Cervical: No cervical adenopathy.  Skin:    General: Skin is warm and dry.  Neurological:     Mental Status: She is alert. Mental status is at baseline.     Comments: Has abnormal facial movements and hand tremors; is less today       10-14-22: SLUMS 18/30  Psychiatric:        Mood and Affect: Mood normal.     ASSESSMENT/ PLAN:  TODAY  PAF (paroxysmal atrial fibrillation) Vascular dementia without  behavioral disturbance Major depression with psychotic features  Will continue current medications Will continue current plan of care Will continue to monitor her status The goal of her care is for assisted living at this time.   Time spent with patient: 40 minutes: care plan; dietary; medications.   Synthia Innocent NP [facility]   call [phone]

## 2023-10-27 ENCOUNTER — Non-Acute Institutional Stay (SKILLED_NURSING_FACILITY): Payer: Self-pay | Admitting: Student

## 2023-10-27 DIAGNOSIS — F323 Major depressive disorder, single episode, severe with psychotic features: Secondary | ICD-10-CM

## 2023-10-27 DIAGNOSIS — I480 Paroxysmal atrial fibrillation: Secondary | ICD-10-CM

## 2023-10-27 DIAGNOSIS — F5104 Psychophysiologic insomnia: Secondary | ICD-10-CM | POA: Diagnosis not present

## 2023-10-27 NOTE — Progress Notes (Signed)
Location:  [facility] Room Number: 101 Place of Service: SNF (31)   CODE STATUS: Full Code  Allergies  Allergen Reactions   Ticlid [Ticlopidine] Shortness Of Breath   Nsaids Other (See Comments)    Bleeding ulcer   Prednisone Other (See Comments)    Makes her stomach hurt--knows this isn't an allergy but doesn't want to take it    Trazodone And Nefazodone     hjallucinations   Monistat [Miconazole] Swelling and Rash   Myrbetriq Theodosia Paling Er] Rash    See 02/27/2023   Sulfa Antibiotics Rash    No chief complaint on file.   HPI:  [name] is a 77 y.o female with pertinent pmhx of PAF (paroxysmal atrial fibrillation), Vascular dementia without behavioral disturbance, Major depression with psychotic features, and insomnia.  Today her primary concern is her inability to sleep.  Patient reports no difficulty falling asleep, particularly when taking her Alfonso Patten however she wakes up often.  She denies pain, and believes anxiety as well as wakeful feeling is keeping her from sleeping.  She has been receiving her Cymbalta 60 mg daily, and her buspirone twice daily.  Additionally she is taking modafinil, question utility in medication-as this may be contributing to her insomnia.  Aside from insomnia, she has no other concerns.  No fevers, chills, cough, shortness of breath, chest pain, swelling, reduced appetite.  Past Medical History:  Diagnosis Date   Anemia    Arthritis    Asthma    Atrial fibrillation (HCC)    COVID-19 08/05/2022   Depression    H/O head injury    Hearing loss    Heart murmur    can be heard at times, Doctor said not to worry   History of kidney stones    History of stomach ulcers    Migraine    Pneumonia    Psittacosis    Stroke (HCC)    Weakness of neck 03/02/2013    Past Surgical History:  Procedure Laterality Date   ANKLE FRACTURE SURGERY Left    APPENDECTOMY     BREAST LUMPECTOMY     CESAREAN SECTION     x 3   I  & D KNEE WITH POLY EXCHANGE Left 08/16/2022   Procedure: KNEE POLY EXCHANGE;  Surgeon: Bjorn Pippin, MD;  Location: [facility] OR;  Service: Orthopedics;  Laterality: Left;   KNEE ARTHROSCOPY WITH PATELLAR TENDON REPAIR Left 08/16/2022   Procedure: IRRIGATION AND DEBRIDEMENT LEFT KNEE REVISION WITH PATELLAR TENDON REPAIR and poly exchange;  Surgeon: Bjorn Pippin, MD;  Location: [facility] OR;  Service: Orthopedics;  Laterality: Left;   PARTIAL HYSTERECTOMY     ROTATOR CUFF REPAIR Right    TOTAL KNEE ARTHROPLASTY Left 07/22/2022   Procedure: TOTAL KNEE ARTHROPLASTY;  Surgeon: Joen Laura, MD;  Location: [facility];  Service: Orthopedics;  Laterality: Left;    Social History   Socioeconomic History   Marital status: Married    Spouse name: Not on file   Number of children: 3   Years of education: Not on file   Highest education level: Not on file  Occupational History   Occupation: retired  Tobacco Use   Smoking status: Never   Smokeless tobacco: Never  Vaping Use   Vaping status: Never Used  Substance and Sexual Activity   Alcohol use: Not Currently    Comment: occ beer   Drug use: No   Sexual activity: Yes    Birth control/protection: Surgical  Comment: hyst  Other Topics Concern   Not on file  Social History Narrative   Not on file   Social Drivers of Health   Financial Resource Strain: Medium Risk (01/30/2021)   Overall Financial Resource Strain (CARDIA)    Difficulty of Paying Living Expenses: Somewhat hard  Food Insecurity: No Food Insecurity (08/15/2022)   Hunger Vital Sign    Worried About Running Out of Food in the Last Year: Never true    Ran Out of Food in the Last Year: Never true  Transportation Needs: No Transportation Needs (08/15/2022)   PRAPARE - Administrator, Civil Service (Medical): No    Lack of Transportation (Non-Medical): No  Physical Activity: Insufficiently Active (01/30/2021)   Exercise Vital Sign    Days of Exercise per Week: 2 days     Minutes of Exercise per Session: 10 min  Stress: No Stress Concern Present (01/30/2021)   [employer] of Occupational Health - Occupational Stress Questionnaire    Feeling of Stress : Only a little  Social Connections: Moderately Integrated (01/30/2021)   Social Connection and Isolation Panel [NHANES]    Frequency of Communication with Friends and Family: Twice a week    Frequency of Social Gatherings with Friends and Family: Twice a week    Attends Religious Services: More than 4 times per year    Active Member of [employer] or Organizations: No    Attends Banker Meetings: Never    Marital Status: Married  Catering manager Violence: Not At Risk (08/15/2022)   Humiliation, Afraid, Rape, and Kick questionnaire    Fear of Current or Ex-Partner: No    Emotionally Abused: No    Physically Abused: No    Sexually Abused: No   Family History  Problem Relation Age of Onset   Cancer Father    Heart disease Brother    Alcohol abuse Brother    Depression Maternal Grandmother    Depression Grandchild    Breast cancer Daughter       VITAL SIGNS There were no vitals taken for this visit.  Outpatient Encounter Medications as of 10/27/2023  Medication Sig   acetaminophen (TYLENOL) 650 MG CR tablet Take 650 mg by mouth every 8 (eight) hours.   apixaban (ELIQUIS) 5 MG TABS tablet Take 1 tablet (5 mg total) by mouth 2 (two) times daily.   ARIPiprazole (ABILIFY) 10 MG tablet Take 10 mg by mouth daily.   busPIRone (BUSPAR) 10 MG tablet Take 20 mg by mouth 2 (two) times daily.   Deutetrabenazine ER (AUSTEDO XR) 24 MG TB24 Take 24 mg by mouth daily at 2 PM.   donepezil (ARICEPT) 10 MG tablet Take 10 mg by mouth at bedtime.   DULoxetine (CYMBALTA) 60 MG capsule Take 60 mg by mouth daily.   eszopiclone (LUNESTA) 2 MG TABS tablet Take 1 tablet (2 mg total) by mouth at bedtime. Take immediately before bedtime   fexofenadine (ALLEGRA) 180 MG tablet Take 180 mg by mouth daily.    furosemide (LASIX) 20 MG tablet Take 20 mg by mouth.   Magnesium Hydroxide (MILK OF MAGNESIA PO) Take 30 mLs by mouth as needed (constipation).   metoprolol tartrate (LOPRESSOR) 25 MG tablet Take 0.5 tablets (12.5 mg total) by mouth 2 (two) times daily.   modafinil (PROVIGIL) 200 MG tablet Take 0.5 tablets (100 mg total) by mouth daily.   rosuvastatin (CRESTOR) 5 MG tablet Take 5 mg by mouth at bedtime.   senna (SENOKOT) 8.6 MG  TABS tablet Take 1 tablet (8.6 mg total) by mouth 2 (two) times daily.   SUMAtriptan (IMITREX) 100 MG tablet Take 100 mg by mouth every 2 (two) hours as needed for migraine or headache. May repeat in 2 hours if headache persists or recurs.   UNABLE TO FIND Diet: Regular   Vitamins A & D (VITAMIN A & D) ointment Apply 1 Application topically 2 (two) times daily. To left heel   ZINC OXIDE EX Apply 1 Application topically See admin instructions. every shift and prn Special Instructions: Apply to sacrum, bilateral buttocks and coccyx q shift for redness.   No facility-administered encounter medications on file as of 10/27/2023.     SIGNIFICANT DIAGNOSTIC EXAMS No new labs.  Physical Exam General: NAD, pleasant HEENT: Normocephalic, atraumatic head. Normal external ear bilaterally. EOM intact and normal conjunctiva BL. Normal external nose. Cardio: RRR, no MRG. Respiratory: CTAB, normal wob on RA Skin: Warm and dry Neuro: Awake, alert, active.  No overt focal neurologic deficits.  Resting tremor, resolves with purposeful movement.  ASSESSMENT/ PLAN:  TODAY Insomnia: Difficulty staying alseep, currently on Lunesta. Concern Modafinil is contributing, recommend discontinuing. Suspect anxiety contributing. Follow-up with psychiatry and discuss risk/benefits of medication with family.  Major Depression with psychotic features: Continue Cymbalta 60 mg daily.  Continue 10 mg twice daily.  Follow-up with psychiatry.  Paroxysmal Atrial Fibrillation: Regular rate and rhythm  on auscultation.  Continue metoprolol 12.5 mg twice daily.  Hold for pulse less than 60.   Synthia Innocent NP [facility]  Contact [phone] Monday through Friday 8am- 5pm  After hours call [phone]

## 2023-10-27 NOTE — Addendum Note (Signed)
Addended by: Sharee Holster on: 10/27/2023 01:31 PM   Modules accepted: Level of Service

## 2023-10-28 DIAGNOSIS — F332 Major depressive disorder, recurrent severe without psychotic features: Secondary | ICD-10-CM | POA: Diagnosis not present

## 2023-10-28 DIAGNOSIS — F411 Generalized anxiety disorder: Secondary | ICD-10-CM | POA: Diagnosis not present

## 2023-10-28 DIAGNOSIS — F5105 Insomnia due to other mental disorder: Secondary | ICD-10-CM | POA: Diagnosis not present

## 2023-10-28 DIAGNOSIS — G2401 Drug induced subacute dyskinesia: Secondary | ICD-10-CM | POA: Diagnosis not present

## 2023-10-30 ENCOUNTER — Ambulatory Visit (INDEPENDENT_AMBULATORY_CARE_PROVIDER_SITE_OTHER): Payer: PPO | Admitting: Psychiatry

## 2023-10-30 ENCOUNTER — Encounter: Payer: Self-pay | Admitting: Psychiatry

## 2023-10-30 DIAGNOSIS — F5101 Primary insomnia: Secondary | ICD-10-CM

## 2023-10-30 DIAGNOSIS — F3341 Major depressive disorder, recurrent, in partial remission: Secondary | ICD-10-CM

## 2023-10-30 DIAGNOSIS — F411 Generalized anxiety disorder: Secondary | ICD-10-CM | POA: Diagnosis not present

## 2023-10-30 NOTE — Progress Notes (Signed)
[name] [id number] Jan 02, 1946 77 y.o.  Subjective:   Patient ID:  [name] is a 77 y.o. (DOB 1946/10/20) female.  Chief Complaint:  Chief Complaint  Patient presents with   Medication Problem    Parkinsonism    HPI [name] presents to the office today for follow-up of depression, anxiety, and insomnia. She is accompanied by her husband. She reports worsening tremor. She reports that her tremor is interfering with multiple activities to include eating and drinking.   She reports, "I just found out yesterday that I am not going to walk anymore."  She reports sadness in response to learning that she will likely not be able to walk again. She reports, "I'm feeling real weak." She reports that her energy is low. She reports that she tries to push herself to do things and that it is difficult some days. She reports that her mood is "pretty much the same" with persistent depressed mood. She reports anxiety and worry. She reports that she had a panic attack the other day. She reports that she falls asleep without difficulty and awakens multiple times during the night. Appetite is "ok." She reports difficulty with concentration. She has not been reading recently. Enjoys being with family. She enjoyed 2 friends coming and bringing lunch. Denies SI.   Past medication trials: Celexa Pristiq Wellbutrin-hallucinations Effexor Prozac Zoloft Lexapro Nefazodone-effective and well-tolerated Cymbalta Abilify Latuda-Adverse reaction. Worsening depression Lamotrigine-Worsening mood and anxiety Nuedexta Trazodone Gabapentin- Disrupted sleep schedules, nightmares.  Ingrezza  Austedo XR Vistaril Nuvigil Provigil Memantine Lunesta- Effective for insomnia  AIMS    Flowsheet Row Office Visit from 10/30/2023 in [facility] Office Visit from 03/27/2023 in [facility] Visit from 02/11/2023 [facility] Visit from 06/18/2022 in [facility] Office Visit from 05/02/2022 in [facility]  AIMS Total Score 6 17 10 6 11       GAD-7    Flowsheet Row Office Visit from 01/30/2021 in [facility]  Total GAD-7 Score 1      Mini-Mental    Flowsheet Row Office Visit from 04/03/2022 in [facility]  Total Score (max 30 points ) 30      PHQ2-9    Flowsheet Row Nursing Home from 02/06/2023 in [facility] from 12/12/2022 in [facility] Nursing Home from 11/06/2022 in [facility] Office Visit from 01/30/2021 in [facility] Office Visit from 05/11/2019 in [facility]  PHQ-2 Total Score 0 0 1 2 1   PHQ-9 Total Score 1 0 3 6 --      Flowsheet Row Pre-Admission Testing 61 from 10/11/2022 in [place] [facility] Admission (Discharged) from 08/14/2022 in [facility] Admission (Discharged) from 07/22/2022 in [place] [facility]  C-SSRS RISK CATEGORY No Risk No Risk No Risk        Review of Systems:  Review of Systems  Musculoskeletal:  Positive for gait problem.  Neurological:  Positive for tremors.  Psychiatric/Behavioral:         Please refer to HPI    Medications: I have reviewed the patient's current medications.  Current Outpatient Medications  Medication Sig Dispense  Refill   apixaban (ELIQUIS) 5 MG TABS tablet Take 1 tablet (5 mg total) by mouth 2 (two) times daily. 60 tablet    ARIPiprazole (ABILIFY) 10 MG tablet Take 10 mg by mouth daily.     busPIRone (BUSPAR) 10 MG tablet Take 20 mg by mouth 2 (two) times daily.     Deutetrabenazine ER (AUSTEDO XR) 24 MG TB24 Take 24 mg by  mouth daily at 2 PM.     donepezil (ARICEPT) 10 MG tablet Take 10 mg by mouth at bedtime.     DULoxetine (CYMBALTA) 60 MG capsule Take 60 mg by mouth daily.     eszopiclone (LUNESTA) 2 MG TABS tablet Take 1 tablet (2 mg total) by mouth at bedtime. Take immediately before bedtime 30 tablet 0   fexofenadine (ALLEGRA) 180 MG tablet Take 180 mg by mouth daily.     furosemide (LASIX) 20 MG tablet Take 20 mg by mouth.     metoprolol tartrate (LOPRESSOR) 25 MG tablet Take 0.5 tablets (12.5 mg total) by mouth 2 (two) times daily. 90 tablet 3   modafinil (PROVIGIL) 200 MG tablet Take 0.5 tablets (100 mg total) by mouth daily. 15 tablet 0   rosuvastatin (CRESTOR) 5 MG tablet Take 5 mg by mouth at bedtime.     senna (SENOKOT) 8.6 MG TABS tablet Take 1 tablet (8.6 mg total) by mouth 2 (two) times daily. 120 tablet 0   SUMAtriptan (IMITREX) 100 MG tablet Take 100 mg by mouth every 2 (two) hours as needed for migraine or headache. May repeat in 2 hours if headache persists or recurs.     acetaminophen (TYLENOL) 650 MG CR tablet Take 650 mg by mouth every 8 (eight) hours.     Magnesium Hydroxide (MILK OF MAGNESIA PO) Take 30 mLs by mouth as needed (constipation).     UNABLE TO FIND Diet: Regular     Vitamins A & D (VITAMIN A & D) ointment Apply 1 Application topically 2 (two) times daily. To left heel     ZINC OXIDE EX Apply 1 Application topically See admin instructions. every shift and prn Special Instructions: Apply to sacrum, bilateral buttocks and coccyx q shift for redness.     No current facility-administered medications for this visit.    Medication Side Effects: None  Allergies:  Allergies  Allergen Reactions   Ticlid [Ticlopidine] Shortness Of Breath   Nsaids Other (See Comments)    Bleeding ulcer   Prednisone Other (See Comments)    Makes her stomach hurt--knows this isn't an allergy but doesn't want to take it    Trazodone And Nefazodone     hjallucinations   Monistat [Miconazole]  Swelling and Rash   Myrbetriq Theodosia Paling Er] Rash    See 02/27/2023   Sulfa Antibiotics Rash    Past Medical History:  Diagnosis Date   Anemia    Arthritis    Asthma    Atrial fibrillation (HCC)    COVID-19 08/05/2022   Depression    H/O head injury    Hearing loss    Heart murmur    can be heard at times, Doctor said not to worry   History of kidney stones    History of stomach ulcers    Migraine    Pneumonia    Psittacosis    Stroke (HCC)    Weakness of neck 03/02/2013    Past Medical History, Surgical history, Social history, and Family history were reviewed and updated as appropriate.   Please  see review of systems for further details on the patient's review from today.   Objective:   Physical Exam:  There were no vitals taken for this visit.  Physical Exam Constitutional:      General: She is not in acute distress. Musculoskeletal:     Comments: Pt in w/c  Neurological:     Mental Status: She is alert and oriented to person, place, and time.     Coordination: Coordination normal.  Psychiatric:        Attention and Perception: Attention and perception normal. She does not perceive auditory or visual hallucinations.        Mood and Affect: Mood is anxious. Affect is not labile, blunt, angry or inappropriate.        Speech: Speech normal.        Behavior: Behavior normal.        Thought Content: Thought content normal. Thought content is not paranoid or delusional. Thought content does not include homicidal or suicidal ideation. Thought content does not include homicidal or suicidal plan.        Cognition and Memory: Cognition and memory normal.        Judgment: Judgment normal.     Comments: Insight intact Mood is sad in response to not being able to walk     Lab Review:     Component Value Date/Time   NA 135 06/26/2023 0800   NA 140 05/27/2017 1553   K 3.9 06/26/2023 0800   CL 98 06/26/2023 0800   CO2 30 06/26/2023 0800   GLUCOSE 97 06/26/2023 0800    BUN 14 06/26/2023 0800   BUN 15 05/27/2017 1553   CREATININE 0.57 06/26/2023 0800   CALCIUM 8.7 (L) 06/26/2023 0800   PROT 7.0 07/18/2022 1459   PROT 7.4 05/27/2017 1553   ALBUMIN 4.1 07/18/2022 1459   ALBUMIN 4.5 05/27/2017 1553   AST 24 07/18/2022 1459   ALT 15 07/18/2022 1459   ALKPHOS 50 07/18/2022 1459   BILITOT 0.5 07/18/2022 1459   BILITOT 0.4 05/27/2017 1553   GFRNONAA >60 06/26/2023 0800   GFRAA 104 05/27/2017 1553       Component Value Date/Time   WBC 6.1 06/26/2023 0800   RBC 3.68 (L) 06/26/2023 0800   HGB 11.7 (L) 06/26/2023 0800   HCT 36.2 06/26/2023 0800   PLT 160 06/26/2023 0800   MCV 98.4 06/26/2023 0800   MCH 31.8 06/26/2023 0800   MCHC 32.3 06/26/2023 0800   RDW 12.6 06/26/2023 0800   LYMPHSABS 1.0 02/28/2023 0800   MONOABS 0.3 02/28/2023 0800   EOSABS 0.0 02/28/2023 0800   BASOSABS 0.0 02/28/2023 0800    No results found for: "POCLITH", "LITHIUM"   No results found for: "PHENYTOIN", "PHENOBARB", "VALPROATE", "CBMZ"   .res Assessment: Plan:   33 minutes spent dedicated to the care of this patient on the date of this encounter to include pre-visit review of records, ordering of medication, post visit documentation, and face-to-face time with the patient discussing her concerns about worsening tremor and involuntary movements with decrease in [place]. Discussed that Abilify may be causing medication-induced Parkinsonism and recommend decreasing Abilify to possibly improve involuntary movements.  Recommend continuing other medications without changes at this time.  Patient advised to contact office with any questions, adverse effects, or acute worsening in signs and symptoms.   [name] was seen today for medication problem.  Diagnoses and all orders for this visit:  Generalized anxiety disorder  Recurrent major depressive disorder, in partial remission (  HCC)  Primary insomnia     Please see After Visit Summary for patient specific  instructions.  No future appointments.   No orders of the defined types were placed in this encounter.   -------------------------------

## 2023-11-06 ENCOUNTER — Other Ambulatory Visit (HOSPITAL_COMMUNITY)
Admission: RE | Admit: 2023-11-06 | Discharge: 2023-11-06 | Disposition: A | Discharge status: Home / Self Care | Payer: PPO | Source: Skilled Nursing Facility | Attending: Adult Health | Admitting: Adult Health

## 2023-11-06 DIAGNOSIS — I87303 Chronic venous hypertension (idiopathic) without complications of bilateral lower extremity: Secondary | ICD-10-CM | POA: Insufficient documentation

## 2023-11-06 LAB — BASIC METABOLIC PANEL
Anion gap: 11 (ref 5–15)
BUN: 27 mg/dL — ABNORMAL HIGH (ref 8–23)
CO2: 28 mmol/L (ref 22–32)
Calcium: 9.3 mg/dL (ref 8.9–10.3)
Chloride: 100 mmol/L (ref 98–111)
Creatinine, Ser: 0.8 mg/dL (ref 0.44–1.00)
GFR, Estimated: >60 mL/min (ref >60)
Glucose, Bld: 154 mg/dL — ABNORMAL HIGH (ref 70–99)
Potassium: 3.6 mmol/L (ref 3.5–5.1)
Sodium: 139 mmol/L (ref 135–145)

## 2023-11-10 ENCOUNTER — Other Ambulatory Visit: Payer: Self-pay | Admitting: Adult Health

## 2023-11-10 MED ORDER — MODAFINIL 200 MG PO TABS: 100.0000 mg | ORAL_TABLET | Freq: Every day | ORAL | 0 refills | Status: DC

## 2023-11-14 ENCOUNTER — Other Ambulatory Visit: Payer: Self-pay | Admitting: Adult Health

## 2023-11-18 ENCOUNTER — Non-Acute Institutional Stay (SKILLED_NURSING_FACILITY): Payer: PPO | Admitting: Adult Health

## 2023-11-18 ENCOUNTER — Encounter: Payer: Self-pay | Admitting: Adult Health

## 2023-11-18 DIAGNOSIS — R400 Somnolence: Secondary | ICD-10-CM | POA: Diagnosis not present

## 2023-11-18 DIAGNOSIS — F5104 Psychophysiologic insomnia: Secondary | ICD-10-CM

## 2023-11-18 DIAGNOSIS — F323 Major depressive disorder, single episode, severe with psychotic features: Secondary | ICD-10-CM

## 2023-11-18 DIAGNOSIS — F0390 Unspecified dementia without behavioral disturbance: Secondary | ICD-10-CM

## 2023-11-18 NOTE — Progress Notes (Signed)
 Location:  [facility] Room Number: 101 Place of Service:  SNF (31)   CODE STATUS: full   Allergies  Allergen Reactions   Ticlid [Ticlopidine] Shortness Of Breath   Nsaids Other (See Comments)    Bleeding ulcer   Prednisone Other (See Comments)    Makes her stomach hurt--knows this isn't an allergy but doesn't want to take it    Trazodone And Nefazodone      hjallucinations   Monistat [Miconazole] Swelling and Rash   Myrbetriq  [Mirabegron  Er] Rash    See 02/27/2023   Sulfa Antibiotics Rash    Chief Complaint  Patient presents with   Medical Management of Chronic Issues         Chronic insomnia:      Psychosis in elderly/major depression with psychotic features:   Somnolence     HPI:  She is a 78 year old long term resident of this facility being seen for the management of his chronic illnesses: Chronic insomnia:      Psychosis in elderly/major depression with psychotic features:   Somnolence. There are no reports of uncontrolled pain. Her mood state remains without significant change is followed by psychiatry. Her weight is stable.   Past Medical History:  Diagnosis Date   Anemia    Arthritis    Asthma    Atrial fibrillation (HCC)    COVID-19 08/05/2022   Depression    H/O head injury    Hearing loss    Heart murmur    can be heard at times, Doctor said not to worry   History of kidney stones    History of stomach ulcers    Migraine    Pneumonia    Psittacosis    Stroke (HCC)    Weakness of neck 03/02/2013    Past Surgical History:  Procedure Laterality Date   ANKLE FRACTURE SURGERY Left    APPENDECTOMY     BREAST LUMPECTOMY     CESAREAN SECTION     x 3   I & D KNEE WITH POLY EXCHANGE Left 08/16/2022   Procedure: KNEE POLY EXCHANGE;  Surgeon: Cristy Bonner DASEN, MD;  Location: [facility] OR;  Service: Orthopedics;  Laterality: Left;   KNEE ARTHROSCOPY WITH PATELLAR TENDON REPAIR Left 08/16/2022   Procedure: IRRIGATION AND DEBRIDEMENT LEFT KNEE  REVISION WITH PATELLAR TENDON REPAIR and poly exchange;  Surgeon: Cristy Bonner DASEN, MD;  Location: [facility] OR;  Service: Orthopedics;  Laterality: Left;   PARTIAL HYSTERECTOMY     ROTATOR CUFF REPAIR Right    TOTAL KNEE ARTHROPLASTY Left 07/22/2022   Procedure: TOTAL KNEE ARTHROPLASTY;  Surgeon: Edna Toribio LABOR, MD;  Location: [facility] ORS;  Service: Orthopedics;  Laterality: Left;    Social History   Socioeconomic History   Marital status: Married    Spouse name: Not on file   Number of children: 3   Years of education: Not on file   Highest education level: Not on file  Occupational History   Occupation: retired  Tobacco Use   Smoking status: Never   Smokeless tobacco: Never  Vaping Use   Vaping status: Never Used  Substance and Sexual Activity   Alcohol  use: Not Currently    Comment: occ beer   Drug use: No   Sexual activity: Yes    Birth control/protection: Surgical    Comment: hyst  Other Topics Concern   Not on file  Social History Narrative   Not on file   Social Drivers of Health  Financial Resource Strain: Medium Risk (01/30/2021)   Overall Financial Resource Strain (CARDIA)    Difficulty of Paying Living Expenses: Somewhat hard  Food Insecurity: No Food Insecurity (08/15/2022)   Hunger Vital Sign    Worried About Running Out of Food in the Last Year: Never true    Ran Out of Food in the Last Year: Never true  Transportation Needs: No Transportation Needs (08/15/2022)   PRAPARE - Administrator, Civil Service (Medical): No    Lack of Transportation (Non-Medical): No  Physical Activity: Insufficiently Active (01/30/2021)   Exercise Vital Sign    Days of Exercise per Week: 2 days    Minutes of Exercise per Session: 10 min  Stress: No Stress Concern Present (01/30/2021)   [employer] of Occupational Health - Occupational Stress Questionnaire    Feeling of Stress : Only a little  Social Connections: Moderately Integrated (01/30/2021)   Social Connection  and Isolation Panel [NHANES]    Frequency of Communication with Friends and Family: Twice a week    Frequency of Social Gatherings with Friends and Family: Twice a week    Attends Religious Services: More than 4 times per year    Active Member of [employer] or Organizations: No    Attends Banker Meetings: Never    Marital Status: Married  Catering Manager Violence: Not At Risk (08/15/2022)   Humiliation, Afraid, Rape, and Kick questionnaire    Fear of Current or Ex-Partner: No    Emotionally Abused: No    Physically Abused: No    Sexually Abused: No   Family History  Problem Relation Age of Onset   Cancer Father    Heart disease Brother    Alcohol  abuse Brother    Depression Maternal Grandmother    Depression Grandchild    Breast cancer Daughter       VITAL SIGNS BP 119/80   Pulse 64   Temp 98.3 F (36.8 C)   Resp (!) 24   Ht 5' (1.524 m)   Wt 173 lb (78.5 kg)   SpO2 97%   BMI 33.79 kg/m   Outpatient Encounter Medications as of 11/18/2023  Medication Sig   acetaminophen  (TYLENOL ) 650 MG CR tablet Take 650 mg by mouth every 8 (eight) hours.   apixaban  (ELIQUIS ) 5 MG TABS tablet Take 1 tablet (5 mg total) by mouth 2 (two) times daily.   ARIPiprazole  (ABILIFY ) 10 MG tablet Take 10 mg by mouth daily.   busPIRone  (BUSPAR ) 10 MG tablet Take 20 mg by mouth 2 (two) times daily.   Deutetrabenazine ER (AUSTEDO XR) 24 MG TB24 Take 24 mg by mouth daily at 2 PM.   donepezil (ARICEPT) 10 MG tablet Take 10 mg by mouth at bedtime.   DULoxetine  (CYMBALTA ) 60 MG capsule Take 60 mg by mouth daily.   eszopiclone  (LUNESTA ) 2 MG TABS tablet Take 1 tablet (2 mg total) by mouth at bedtime. Take immediately before bedtime   fexofenadine (ALLEGRA) 180 MG tablet Take 180 mg by mouth daily.   furosemide  (LASIX ) 20 MG tablet Take 20 mg by mouth.   Magnesium  Hydroxide (MILK OF MAGNESIA PO) Take 30 mLs by mouth as needed (constipation).   metoprolol  tartrate (LOPRESSOR ) 25 MG tablet Take  0.5 tablets (12.5 mg total) by mouth 2 (two) times daily.   modafinil  (PROVIGIL ) 200 MG tablet Take 0.5 tablets (100 mg total) by mouth daily.   rosuvastatin (CRESTOR) 5 MG tablet Take 5 mg by mouth at bedtime.  senna (SENOKOT) 8.6 MG TABS tablet Take 1 tablet (8.6 mg total) by mouth 2 (two) times daily.   SUMAtriptan  (IMITREX ) 100 MG tablet Take 100 mg by mouth every 2 (two) hours as needed for migraine or headache. May repeat in 2 hours if headache persists or recurs.   UNABLE TO FIND Diet: Regular   Vitamins A & D (VITAMIN A & D) ointment Apply 1 Application topically 2 (two) times daily. To left heel   ZINC OXIDE EX Apply 1 Application topically See admin instructions. every shift and prn Special Instructions: Apply to sacrum, bilateral buttocks and coccyx q shift for redness.   No facility-administered encounter medications on file as of 11/18/2023.     SIGNIFICANT DIAGNOSTIC EXAMS  LABS REVIEWED PREVIOUS   11-14-22: hepatitis C nr 02-28-23: wbc 7.6; hgb 12.1; hct 375; mcv 100.5 plt 216   03-24-23: wbc 8.2; hgb 12.6; hct 39.5; mcv 101.1 plt 201; hgb A1c 5.4 chol 197; ldl 125; hdl 91; hdl 54  06-26-23: wbc 6.1; hgb 11.7; hct 36.2; mcv 98.4 plt 160; glucose 97; bun 14; creat 0.57; k+ 3.9; na++ 135; ca 8.7; gfr >60 d-dimer 0.28  CRP 3.0  TODAY  10-16-23: chol 136; ldl 66; trig 70 hdl 56 11-06-23: glucose 154; bun 27; creat 0.80; k+ 3.6; na++ 139; ca 9.3 gfr >60    Review of Systems  Constitutional:  Negative for malaise/fatigue.  Respiratory:  Negative for cough and shortness of breath.   Cardiovascular:  Negative for chest pain, palpitations and leg swelling.  Gastrointestinal:  Negative for abdominal pain, constipation and heartburn.  Musculoskeletal:  Negative for back pain, joint pain and myalgias.  Skin: Negative.   Neurological:  Negative for dizziness.  Psychiatric/Behavioral:  The patient is nervous/anxious.    Physical Exam Constitutional:      General: She is not in acute  distress.    Appearance: She is well-developed. She is not diaphoretic.  Neck:     Thyroid : No thyromegaly.  Cardiovascular:     Rate and Rhythm: Normal rate and regular rhythm.     Pulses: Normal pulses.     Heart sounds: Normal heart sounds.  Pulmonary:     Effort: Pulmonary effort is normal. No respiratory distress.     Breath sounds: Normal breath sounds.  Abdominal:     General: Bowel sounds are normal. There is no distension.     Palpations: Abdomen is soft.     Tenderness: There is no abdominal tenderness.  Musculoskeletal:        General: Normal range of motion.     Cervical back: Neck supple.     Right lower leg: No edema.     Left lower leg: No edema.  Lymphadenopathy:     Cervical: No cervical adenopathy.  Skin:    General: Skin is warm and dry.  Neurological:     Mental Status: She is alert. Mental status is at baseline.     Comments:  Has abnormal facial movements and hand tremors; is less today       10-14-22: SLUMS 18/30   Psychiatric:        Mood and Affect: Mood normal.      ASSESSMENT/ PLAN:  TODAY  Chronic insomnia: will continue lunesta  2 mg nightly   2. Psychosis in elderly/major depression with psychotic features: will continue abilify  10 mg daily cymbalta  60 mg daily buspar  20 mg twice daily   3. Somnolence: will continue provigil  100 mg daily    PREVIOUS  4. Protein calorie malnutrition: will continue supplements as directed  5. Urinary incontinence: is off myrbetriq  due to rash.   6. Longstanding persistent atrial fibrillation/history of DVT: heart rate is stable will continue eliquis  5 mg twice daily and is taking lopressor  25 mg twice daily for rate control  7. Migraine without status migrainosus, nonintractable unspecified type:  8.  Uncomplicated asthma unspecified asthma severity unspecified: unspecified whether persistent: is on allegra 180 mg daily   9. Chronic constipation: will continue senna s twice daily   10. Vascular  dementia without behavioral disturbance/neurocognitive deficit: weight is 175 pounds; will continue aricept 10 mg daily   11. Tardive dyskinesia: does continue to have abnormal facial and hand movements; has failed ingrezza  therapy; will continue auestuda xr 24 mg daily; this dose was lowered to due depressive thoughts.  if this is unsuccessful will need to try  tetrabenazine.   12. Chronic pain syndrome/periprosthetic fracture around internal prosthetic left knee joint sequela: is on cymbalta  60 mg daily and tylenol  650 mg nightly   13. Bilateral lower extremity edema: will continue lasix  20 mg daily   14. Chronic anemia: hgb 12.1     Barnie Seip NP [facility]  call [phone]

## 2023-11-19 ENCOUNTER — Encounter: Payer: Self-pay | Admitting: Adult Health

## 2023-11-24 ENCOUNTER — Other Ambulatory Visit: Payer: Self-pay | Admitting: Adult Health

## 2023-11-24 DIAGNOSIS — G2401 Drug induced subacute dyskinesia: Secondary | ICD-10-CM | POA: Diagnosis not present

## 2023-11-24 DIAGNOSIS — F332 Major depressive disorder, recurrent severe without psychotic features: Secondary | ICD-10-CM | POA: Diagnosis not present

## 2023-11-24 DIAGNOSIS — F5105 Insomnia due to other mental disorder: Secondary | ICD-10-CM | POA: Diagnosis not present

## 2023-11-24 DIAGNOSIS — F411 Generalized anxiety disorder: Secondary | ICD-10-CM | POA: Diagnosis not present

## 2023-11-24 MED ORDER — ESZOPICLONE 2 MG PO TABS: 2.0000 mg | ORAL_TABLET | Freq: Every day | ORAL | 0 refills | Status: DC

## 2023-12-03 ENCOUNTER — Encounter: Payer: Self-pay | Admitting: Adult Health

## 2023-12-03 NOTE — Progress Notes (Signed)
Location:  [facility] Room Number: 101 Place of Service:  SNF (31)   CODE STATUS: full   Allergies  Allergen Reactions   Ticlid [Ticlopidine] Shortness Of Breath   Nsaids Other (See Comments)    Bleeding ulcer   Prednisone Other (See Comments)    Makes her stomach hurt--knows this isn't an allergy but doesn't want to take it    Trazodone And Nefazodone     hjallucinations   Monistat [Miconazole] Swelling and Rash   Myrbetriq Theodosia Paling Er] Rash    See 02/27/2023   Sulfa Antibiotics Rash    Chief Complaint  Patient presents with   Medical Management of Chronic Issues               HPI:    Past Medical History:  Diagnosis Date   Anemia    Arthritis    Asthma    Atrial fibrillation (HCC)    COVID-19 08/05/2022   Depression    Elevated C-reactive protein (CRP) 06/27/2023   H/O head injury    Hearing loss    Heart murmur    can be heard at times, Doctor said not to worry   History of kidney stones    History of stomach ulcers    Migraine    Pneumonia    Psittacosis    Stroke (HCC)    Weakness of neck 03/02/2013    Past Surgical History:  Procedure Laterality Date   ANKLE FRACTURE SURGERY Left    APPENDECTOMY     BREAST LUMPECTOMY     CESAREAN SECTION     x 3   I & D KNEE WITH POLY EXCHANGE Left 08/16/2022   Procedure: KNEE POLY EXCHANGE;  Surgeon: Bjorn Pippin, MD;  Location: [facility] OR;  Service: Orthopedics;  Laterality: Left;   KNEE ARTHROSCOPY WITH PATELLAR TENDON REPAIR Left 08/16/2022   Procedure: IRRIGATION AND DEBRIDEMENT LEFT KNEE REVISION WITH PATELLAR TENDON REPAIR and poly exchange;  Surgeon: Bjorn Pippin, MD;  Location: [facility] OR;  Service: Orthopedics;  Laterality: Left;   PARTIAL HYSTERECTOMY     ROTATOR CUFF REPAIR Right    TOTAL KNEE ARTHROPLASTY Left 07/22/2022   Procedure: TOTAL KNEE ARTHROPLASTY;  Surgeon: Joen Laura, MD;  Location: [facility];  Service: Orthopedics;  Laterality: Left;    Social History    Socioeconomic History   Marital status: Married    Spouse name: Not on file   Number of children: 3   Years of education: Not on file   Highest education level: Not on file  Occupational History   Occupation: retired  Tobacco Use   Smoking status: Never   Smokeless tobacco: Never  Vaping Use   Vaping status: Never Used  Substance and Sexual Activity   Alcohol use: Not Currently    Comment: occ beer   Drug use: No   Sexual activity: Yes    Birth control/protection: Surgical    Comment: hyst  Other Topics Concern   Not on file  Social History Narrative   Not on file   Social Drivers of Health   Financial Resource Strain: Medium Risk (01/30/2021)   Overall Financial Resource Strain (CARDIA)    Difficulty of Paying Living Expenses: Somewhat hard  Food Insecurity: No Food Insecurity (08/15/2022)   Hunger Vital Sign    Worried About Running Out of Food in the Last Year: Never true    Ran Out of Food in the Last Year: Never true  Transportation Needs:  No Transportation Needs (08/15/2022)   PRAPARE - Administrator, Civil Service (Medical): No    Lack of Transportation (Non-Medical): No  Physical Activity: Insufficiently Active (01/30/2021)   Exercise Vital Sign    Days of Exercise per Week: 2 days    Minutes of Exercise per Session: 10 min  Stress: No Stress Concern Present (01/30/2021)   [employer] of Occupational Health - Occupational Stress Questionnaire    Feeling of Stress : Only a little  Social Connections: Moderately Integrated (01/30/2021)   Social Connection and Isolation Panel [NHANES]    Frequency of Communication with Friends and Family: Twice a week    Frequency of Social Gatherings with Friends and Family: Twice a week    Attends Religious Services: More than 4 times per year    Active Member of [employer] or Organizations: No    Attends Banker Meetings: Never    Marital Status: Married  Catering manager Violence: Not At Risk  (08/15/2022)   Humiliation, Afraid, Rape, and Kick questionnaire    Fear of Current or Ex-Partner: No    Emotionally Abused: No    Physically Abused: No    Sexually Abused: No   Family History  Problem Relation Age of Onset   Cancer Father    Heart disease Brother    Alcohol abuse Brother    Depression Maternal Grandmother    Depression Grandchild    Breast cancer Daughter       VITAL SIGNS BP 128/74   Pulse 72   Temp 97.6 F (36.4 C)   Resp 20   Ht 5' (1.524 m)   Wt 173 lb (78.5 kg)   SpO2 95%   BMI 33.79 kg/m   Outpatient Encounter Medications as of 12/03/2023  Medication Sig   acetaminophen (TYLENOL) 650 MG CR tablet Take 650 mg by mouth every 8 (eight) hours.   apixaban (ELIQUIS) 5 MG TABS tablet Take 1 tablet (5 mg total) by mouth 2 (two) times daily.   ARIPiprazole (ABILIFY) 10 MG tablet Take 10 mg by mouth daily.   busPIRone (BUSPAR) 10 MG tablet Take 20 mg by mouth 2 (two) times daily.   Deutetrabenazine ER (AUSTEDO XR) 24 MG TB24 Take 24 mg by mouth daily at 2 PM.   donepezil (ARICEPT) 10 MG tablet Take 10 mg by mouth at bedtime.   DULoxetine (CYMBALTA) 60 MG capsule Take 60 mg by mouth daily.   eszopiclone (LUNESTA) 2 MG TABS tablet Take 1 tablet (2 mg total) by mouth at bedtime. Take immediately before bedtime   fexofenadine (ALLEGRA) 180 MG tablet Take 180 mg by mouth daily.   furosemide (LASIX) 20 MG tablet Take 20 mg by mouth.   Magnesium Hydroxide (MILK OF MAGNESIA PO) Take 30 mLs by mouth as needed (constipation).   metoprolol tartrate (LOPRESSOR) 25 MG tablet Take 0.5 tablets (12.5 mg total) by mouth 2 (two) times daily.   modafinil (PROVIGIL) 200 MG tablet Take 0.5 tablets (100 mg total) by mouth daily.   rosuvastatin (CRESTOR) 5 MG tablet Take 5 mg by mouth at bedtime.   senna (SENOKOT) 8.6 MG TABS tablet Take 1 tablet (8.6 mg total) by mouth 2 (two) times daily.   SUMAtriptan (IMITREX) 100 MG tablet Take 100 mg by mouth every 2 (two) hours as needed  for migraine or headache. May repeat in 2 hours if headache persists or recurs.   UNABLE TO FIND Diet: Regular   Vitamins A & D (VITAMIN A &  D) ointment Apply 1 Application topically 2 (two) times daily. To left heel   ZINC OXIDE EX Apply 1 Application topically See admin instructions. every shift and prn Special Instructions: Apply to sacrum, bilateral buttocks and coccyx q shift for redness.   No facility-administered encounter medications on file as of 12/03/2023.     SIGNIFICANT DIAGNOSTIC EXAMS       ASSESSMENT/ PLAN:     Synthia Innocent NP [facility]   call [phone]   This encounter was created in error - please disregard.

## 2023-12-05 ENCOUNTER — Non-Acute Institutional Stay (SKILLED_NURSING_FACILITY): Payer: Self-pay | Admitting: Internal Medicine

## 2023-12-05 ENCOUNTER — Encounter: Payer: Self-pay | Admitting: Internal Medicine

## 2023-12-05 DIAGNOSIS — I480 Paroxysmal atrial fibrillation: Secondary | ICD-10-CM

## 2023-12-05 DIAGNOSIS — E785 Hyperlipidemia, unspecified: Secondary | ICD-10-CM | POA: Diagnosis not present

## 2023-12-05 DIAGNOSIS — D649 Anemia, unspecified: Secondary | ICD-10-CM | POA: Diagnosis not present

## 2023-12-05 DIAGNOSIS — L658 Other specified nonscarring hair loss: Secondary | ICD-10-CM | POA: Diagnosis not present

## 2023-12-05 NOTE — Progress Notes (Unsigned)
NURSING HOME LOCATION:  [facility] ROOM NUMBER:  101 W  CODE STATUS:  Full Code  PCP:  Synthia Innocent NP  This is a nursing facility follow up visit of chronic medical diagnoses & to document compliance with Regulation 483.30 (c) in The Long Term Care Survey Manual Phase 2 which mandates caregiver visit ( visits can alternate among physician, PA or NP as per statutes) within 10 days of 30 days / 60 days/ 90 days post admission to SNF date    Interim medical record and care since last SNF visit was updated with review of diagnostic studies and change in clinical status since last visit were documented.  HPI: She is a permanent resident of this facility with medical diagnoses of degenerative joint disease, history of asthma, history of atrial fibrillation, chronic depression, history of nephrolithiasis, history of GI ulcers, history of migraines, history of silicosis, and history of stroke. Surgeries and procedures include breast lumpectomy and partial hysterectomy.  Most recent labs in December 2024 revealed BUN of 27 & FBS of 154. The most recent A1c was 5.4% in May 2024.Lipids were @ goal with LDL of 66 & HDL of 56. In August slight anemia had been documented with H/H of 11.7/36.2.  Macrocytosis previously documented had resolved. Last TSH on record was 1.490 on 08/04/22.  Review of systems: She initially stated "I'm okay."  She then stated that she was worried about losing her hair as it was "coming out in clumps."  She then stated "depression and that sort of thing."  She is most concerned as she realizes "I will never be able to walk, my legs will not carry me because my foot turns in."  She validates PT has worked with her to no avail. She describes chronic constipation.  Constitutional: No fever, significant weight change  Eyes: No redness, discharge, pain, vision change ENT/mouth: No nasal congestion,  purulent discharge, earache, change in hearing, sore throat   Cardiovascular: No chest pain, palpitations, paroxysmal nocturnal dyspnea, edema  Respiratory: No cough, sputum production, hemoptysis, DOE, significant snoring, apnea   Gastrointestinal: No heartburn, dysphagia, abdominal pain, nausea /vomiting, rectal bleeding, melena Genitourinary: No dysuria, hematuria, pyuria, incontinence, nocturia Dermatologic: No rash, pruritus, change in appearance of skin Neurologic: No dizziness, headache, syncope, seizures, numbness, tingling Psychiatric: No significant insomnia, anorexia Endocrine: No change in skin/nails, excessive thirst, excessive hunger, excessive urination  Hematologic/lymphatic: No significant bruising, lymphadenopathy, abnormal bleeding Allergy/immunology: No itchy/watery eyes, significant sneezing, urticaria, angioedema  Physical exam:  Pertinent or positive findings: She exhibits masked facies.  The most pertinent physical finding is course tremor of the hands.  She also has intermittent side-to-side tremor of the mandible as well as intermittent lipsmacking.  Dental hygiene is excellent.  Heart rate is slow and rhythm regular.  Abdomen is protuberant. Pedal pulses not palpable.  She has 1/2+ edema at the sock line. She has marked osteoarthritic changes of the hands in a mixed DIP/PIP distribution.  Interosseous wasting is noted.  Deep tendon reflexes are 1.5+ in the upper extremities and 0+ at the knees.  The right foot is inverted.  Her hair is very fine; there is no definite alopecia.  General appearance: Adequately nourished; no acute distress, increased work of breathing is present.   Lymphatic: No lymphadenopathy about the head, neck, axilla. Eyes: No conjunctival inflammation or lid edema is present. There is no scleral icterus. Ears:  External ear exam shows no significant lesions or deformities.   Nose:  External  nasal examination shows no deformity or inflammation. Nasal mucosa are pink and moist without lesions, exudates Oral  exam:  Lips and gums are healthy appearing. There is no oropharyngeal erythema or exudate. Neck:  No thyromegaly, masses, tenderness noted.    Heart:  No gallop, murmur, click, rub .  Lungs: Chest clear to auscultation without wheezes, rhonchi, rales, rubs. Abdomen: Bowel sounds are normal. Abdomen is soft and nontender with no organomegaly, hernias, masses. GU: Deferred  Extremities:  No cyanosis, clubbing  Neurologic exam :Balance, Rhomberg, finger to nose testing could not be completed due to clinical state Skin: Warm & dry w/o tenting. No significant lesions or rash.  See summary under each active problem in the Problem List with associated updated therapeutic plan

## 2023-12-05 NOTE — Assessment & Plan Note (Signed)
Heart rate is slow and rhythm regular.  No evidence of PAF.

## 2023-12-05 NOTE — Assessment & Plan Note (Signed)
Lipids are excellent with an LDL of 66 and HDL 56.  No change indicated in statin dose.  Continue annual monitor.

## 2023-12-05 NOTE — Patient Instructions (Signed)
See assessment and plan under each diagnosis in the problem list and acutely for this visit

## 2023-12-05 NOTE — Assessment & Plan Note (Signed)
06/26/2023 H/H 11.7/36.2. No bleeding dyscrasias noted. Prior macrocytosis has resolved. Update CBC. Check B12 level if macrocytosis recurs.

## 2023-12-10 ENCOUNTER — Other Ambulatory Visit: Payer: Self-pay | Admitting: Adult Health

## 2023-12-10 MED ORDER — MODAFINIL 200 MG PO TABS: 100.0000 mg | ORAL_TABLET | Freq: Every day | ORAL | 0 refills | Status: DC

## 2023-12-11 ENCOUNTER — Other Ambulatory Visit (HOSPITAL_COMMUNITY)
Admission: RE | Admit: 2023-12-11 | Discharge: 2023-12-11 | Disposition: A | Discharge status: Home / Self Care | Payer: PPO | Source: Skilled Nursing Facility | Attending: Adult Health | Admitting: Adult Health

## 2023-12-11 DIAGNOSIS — F0150 Vascular dementia without behavioral disturbance: Secondary | ICD-10-CM | POA: Insufficient documentation

## 2023-12-11 LAB — HEMOGLOBIN A1C
Hgb A1c MFr Bld: 5.3 % (ref 4.8–5.6)
Mean Plasma Glucose: 105.41 mg/dL

## 2023-12-11 LAB — CBC
HCT: 36.7 % (ref 36.0–46.0)
Hemoglobin: 11.9 g/dL — ABNORMAL LOW (ref 12.0–15.0)
MCH: 32.2 pg (ref 26.0–34.0)
MCHC: 32.4 g/dL (ref 30.0–36.0)
MCV: 99.5 fL (ref 80.0–100.0)
Platelets: 181 10*3/uL (ref 150–400)
RBC: 3.69 MIL/uL — ABNORMAL LOW (ref 3.87–5.11)
RDW: 13 % (ref 11.5–15.5)
WBC: 5 10*3/uL (ref 4.0–10.5)
nRBC: 0 % (ref 0.0–0.2)

## 2023-12-11 LAB — TSH: TSH: 2.295 u[IU]/mL (ref 0.350–4.500)

## 2023-12-17 DIAGNOSIS — H903 Sensorineural hearing loss, bilateral: Secondary | ICD-10-CM | POA: Diagnosis not present

## 2023-12-22 ENCOUNTER — Encounter: Payer: Self-pay | Admitting: Family Medicine

## 2023-12-22 ENCOUNTER — Non-Acute Institutional Stay (SKILLED_NURSING_FACILITY): Payer: Self-pay | Admitting: Family Medicine

## 2023-12-22 DIAGNOSIS — F5104 Psychophysiologic insomnia: Secondary | ICD-10-CM | POA: Diagnosis not present

## 2023-12-22 DIAGNOSIS — F5105 Insomnia due to other mental disorder: Secondary | ICD-10-CM | POA: Diagnosis not present

## 2023-12-22 DIAGNOSIS — G2401 Drug induced subacute dyskinesia: Secondary | ICD-10-CM

## 2023-12-22 DIAGNOSIS — I480 Paroxysmal atrial fibrillation: Secondary | ICD-10-CM

## 2023-12-22 DIAGNOSIS — F332 Major depressive disorder, recurrent severe without psychotic features: Secondary | ICD-10-CM | POA: Diagnosis not present

## 2023-12-22 DIAGNOSIS — F411 Generalized anxiety disorder: Secondary | ICD-10-CM | POA: Diagnosis not present

## 2023-12-22 NOTE — Addendum Note (Signed)
 Addended by: Marilyne Shu on: 12/22/2023 11:49 AM   Modules accepted: Orders, Level of Service

## 2023-12-22 NOTE — Progress Notes (Signed)
 Location:  [facility]   Place of Service:     CODE STATUS: FULL  Allergies  Allergen Reactions   Ticlid [Ticlopidine] Shortness Of Breath   Nsaids Other (See Comments)    Bleeding ulcer   Prednisone Other (See Comments)    Makes her stomach hurt--knows this isn't an allergy but doesn't want to take it    Trazodone And Nefazodone      hjallucinations   Monistat [Miconazole] Swelling and Rash   Myrbetriq  [Mirabegron  Er] Rash    See 02/27/2023   Sulfa Antibiotics Rash    No chief complaint on file.  HPI:  [name] tells me she is doing "fair" this morning. She is getting used to her new hearing aids that she got 1 week ago. Otherwise, her tremors do continue to bother her on the Austedo. She would be interested in any medication changes that could help her with this. She is happy to hear her lab results a couple weeks ago look good, too.  Past Medical History:  Diagnosis Date   Anemia    Arthritis    Asthma    Atrial fibrillation (HCC)    COVID-19 08/05/2022   Depression    Elevated C-reactive protein (CRP) 06/27/2023   H/O head injury    Hearing loss    Heart murmur    can be heard at times, Doctor said not to worry   History of kidney stones    History of stomach ulcers    Migraine    Pneumonia    Psittacosis    Stroke (HCC)    Weakness of neck 03/02/2013    Past Surgical History:  Procedure Laterality Date   ANKLE FRACTURE SURGERY Left    APPENDECTOMY     BREAST LUMPECTOMY     CESAREAN SECTION     x 3   I & D KNEE WITH POLY EXCHANGE Left 08/16/2022   Procedure: KNEE POLY EXCHANGE;  Surgeon: Micheline Ahr, MD;  Location: [facility] OR;  Service: Orthopedics;  Laterality: Left;   KNEE ARTHROSCOPY WITH PATELLAR TENDON REPAIR Left 08/16/2022   Procedure: IRRIGATION AND DEBRIDEMENT LEFT KNEE REVISION WITH PATELLAR TENDON REPAIR and poly exchange;  Surgeon: Micheline Ahr, MD;  Location: [facility] OR;  Service: Orthopedics;  Laterality: Left;   PARTIAL HYSTERECTOMY      ROTATOR CUFF REPAIR Right    TOTAL KNEE ARTHROPLASTY Left 07/22/2022   Procedure: TOTAL KNEE ARTHROPLASTY;  Surgeon: Murleen Arms, MD;  Location: [facility];  Service: Orthopedics;  Laterality: Left;    Social History   Socioeconomic History   Marital status: Married    Spouse name: Not on file   Number of children: 3   Years of education: Not on file   Highest education level: Not on file  Occupational History   Occupation: retired  Tobacco Use   Smoking status: Never   Smokeless tobacco: Never  Vaping Use   Vaping status: Never Used  Substance and Sexual Activity   Alcohol  use: Not Currently    Comment: occ beer   Drug use: No   Sexual activity: Yes    Birth control/protection: Surgical    Comment: hyst  Other Topics Concern   Not on file  Social History Narrative   Not on file   Social Drivers of Health   Financial Resource Strain: Medium Risk (01/30/2021)   Overall Financial Resource Strain (CARDIA)    Difficulty of Paying Living Expenses: Somewhat hard  Food Insecurity: No Food Insecurity (  08/15/2022)   Hunger Vital Sign    Worried About Running Out of Food in the Last Year: Never true    Ran Out of Food in the Last Year: Never true  Transportation Needs: No Transportation Needs (08/15/2022)   PRAPARE - Administrator, Civil Service (Medical): No    Lack of Transportation (Non-Medical): No  Physical Activity: Insufficiently Active (01/30/2021)   Exercise Vital Sign    Days of Exercise per Week: 2 days    Minutes of Exercise per Session: 10 min  Stress: No Stress Concern Present (01/30/2021)   [employer] of Occupational Health - Occupational Stress Questionnaire    Feeling of Stress : Only a little  Social Connections: Moderately Integrated (01/30/2021)   Social Connection and Isolation Panel [NHANES]    Frequency of Communication with Friends and Family: Twice a week    Frequency of Social Gatherings with Friends and Family: Twice a week     Attends Religious Services: More than 4 times per year    Active Member of [employer] or Organizations: No    Attends Banker Meetings: Never    Marital Status: Married  Catering manager Violence: Not At Risk (08/15/2022)   Humiliation, Afraid, Rape, and Kick questionnaire    Fear of Current or Ex-Partner: No    Emotionally Abused: No    Physically Abused: No    Sexually Abused: No   Family History  Problem Relation Age of Onset   Cancer Father    Heart disease Brother    Alcohol  abuse Brother    Depression Maternal Grandmother    Depression Grandchild    Breast cancer Daughter    OBJECTIVE Pulse 62 this AM BP normal MAP  General: Alert, in NAD Skin: Warm, dry, and intact  HEENT: NCAT, EOM grossly normal, midline nasal septum Cardiac: Regular rate, 2/6 systolic murmur best appreciated at RUSB Respiratory: CTAB, breathing and speaking comfortably on RA Abdominal: Soft, nontender, nondistended, normoactive bowel sounds Neurological: Bilateral upper extremity coarse tremors with mild pill-rolling, intermittent lip smacking and mandibular lateral movements Psychiatric: Flat affect  Outpatient Encounter Medications as of 12/22/2023  Medication Sig   acetaminophen  (TYLENOL ) 650 MG CR tablet Take 650 mg by mouth every 8 (eight) hours.   apixaban  (ELIQUIS ) 5 MG TABS tablet Take 1 tablet (5 mg total) by mouth 2 (two) times daily.   ARIPiprazole  (ABILIFY ) 10 MG tablet Take 10 mg by mouth daily.   busPIRone  (BUSPAR ) 10 MG tablet Take 20 mg by mouth 2 (two) times daily.   Deutetrabenazine ER (AUSTEDO XR) 24 MG TB24 Take 24 mg by mouth daily at 2 PM.   donepezil (ARICEPT) 10 MG tablet Take 10 mg by mouth at bedtime.   DULoxetine  (CYMBALTA ) 60 MG capsule Take 60 mg by mouth daily.   eszopiclone  (LUNESTA ) 2 MG TABS tablet Take 1 tablet (2 mg total) by mouth at bedtime. Take immediately before bedtime   fexofenadine (ALLEGRA) 180 MG tablet Take 180 mg by mouth daily.   furosemide   (LASIX ) 20 MG tablet Take 20 mg by mouth.   Magnesium  Hydroxide (MILK OF MAGNESIA PO) Take 30 mLs by mouth as needed (constipation).   metoprolol  tartrate (LOPRESSOR ) 25 MG tablet Take 0.5 tablets (12.5 mg total) by mouth 2 (two) times daily.   modafinil  (PROVIGIL ) 200 MG tablet Take 0.5 tablets (100 mg total) by mouth daily.   rosuvastatin (CRESTOR) 5 MG tablet Take 5 mg by mouth at bedtime.   senna (SENOKOT) 8.6  MG TABS tablet Take 1 tablet (8.6 mg total) by mouth 2 (two) times daily.   SUMAtriptan  (IMITREX ) 100 MG tablet Take 100 mg by mouth every 2 (two) hours as needed for migraine or headache. May repeat in 2 hours if headache persists or recurs.   UNABLE TO FIND Diet: Regular   Vitamins A & D (VITAMIN A & D) ointment Apply 1 Application topically 2 (two) times daily. To left heel   ZINC OXIDE EX Apply 1 Application topically See admin instructions. every shift and prn Special Instructions: Apply to sacrum, bilateral buttocks and coccyx q shift for redness.   No facility-administered encounter medications on file as of 12/22/2023.   ASSESSMENT/ PLAN:  Tardive dyskinesia, MDD with psychotic features Secondary to use of antipsychotics. Austedo appears to have improved symptoms at higher doses, but unfortunately had to decrease due to increased depressive symptoms. Given continued difficulty on these doses, will transition to tetrabenazine after speaking with pharmacy about dosing changes. Continue abilify , cymbalta , and buspar .  Chronic insomnia Continues on lunesta  2 mg nightly. Also has modafinil  100 mg daily for somnolence (recently decreased in December 2024) which could be contributing. Consider further decreasing modafinil  dosing and assessing response if continues.  PAF, history of stroke, history of DVT HR this AM 62. Appears to be around 60-70s. Remains on Eliquis . BP normal today. Can consider reducing at bedtime dosing of metoprolol  to 12.5 mg and assessing response if HR remains  in low 60s.  Carmen Kenning, MD PGY-2, [facility]  Carmen Candle NP [facility]  Contact [phone] Monday through Friday 8am- 5pm  After hours call [phone]

## 2023-12-24 ENCOUNTER — Telehealth: Payer: Self-pay | Admitting: Internal Medicine

## 2023-12-24 NOTE — Telephone Encounter (Signed)
Pt c/o medication issue:  1. Name of Medication:   2. How are you currently taking this medication (dosage and times per day)?   3. Are you having a reaction (difficulty breathing--STAT)?   4. What is your medication issue?   Patient's husband states during last visit Dr. Jenene Slicker cut one of patient's medications in half. Patient has slowed down a lot since medication decreased and husband assumes patient needs to go back to taking the full dose. Husband doesn't know the name of the medication. May be Metoprolol.

## 2023-12-24 NOTE — Telephone Encounter (Signed)
10/06/23 office note from Dr.Mallipeddi:  -Patient was diagnosed with postop A-fib during 07/2022 hospitalization. Daughter preferred medical management and not TEE guided DCCV.  EKG today showed sinus bradycardia however flutter cannot be completely ruled out due to significant tremors in both upper extremities.  HR between 49 and 50 bpm, she is complaining of fatigue for the last 3 months, will decrease the dose of metoprolol tartrate from 25 to 12.5 mg twice daily.  If she still continues to have fatigue and low heart rates, metoprolol will need to be discontinued.  She will benefit from event monitor if she has persistent fatigue and bradycardia despite not being on rate controlling agents.  Fortunately she does not have any dizziness or lightheadedness or syncope.  She reported that vitamin D, iron and TSH were within normal limits but I do not have the results to review.  Continue Eliquis 5 mg twice daily, no bleeding complications and no falls in the last 6 months.  Currently on rosuvastatin 5 mg nightly which we will continue.  Goal LDL less than 70.    Spouse reports patient is more fatigued than ever.He does not know what her HR is as she is a patient at the [facility] but he says he BP has been elevated.I encouraged him to speak with her doctor there and discuss the medications she is currently taking. She does have lab done that I can see in Epic. I will FYI: Dr.Mallipeddi.

## 2024-01-05 ENCOUNTER — Other Ambulatory Visit: Payer: Self-pay | Admitting: Adult Health

## 2024-01-05 MED ORDER — ESZOPICLONE 2 MG PO TABS: 2.0000 mg | ORAL_TABLET | Freq: Every day | ORAL | 0 refills | Status: DC

## 2024-01-05 MED ORDER — MODAFINIL 200 MG PO TABS: 100.0000 mg | ORAL_TABLET | Freq: Every day | ORAL | 0 refills | Status: DC

## 2024-01-11 IMAGING — MG MM DIGITAL SCREENING BILAT W/ TOMO AND CAD
8 series · 8 of 24 positions shown · non-contrast
Comparison: Previous exam(s).

CLINICAL DATA: Screening.

EXAM:
DIGITAL SCREENING BILATERAL MAMMOGRAM WITH TOMOSYNTHESIS AND CAD
TECHNIQUE: Bilateral screening digital craniocaudal and mediolateral oblique
mammograms were obtained. Bilateral screening digital breast
tomosynthesis was performed. The images were evaluated with
computer-aided detection.

[L MLO synth-2D]
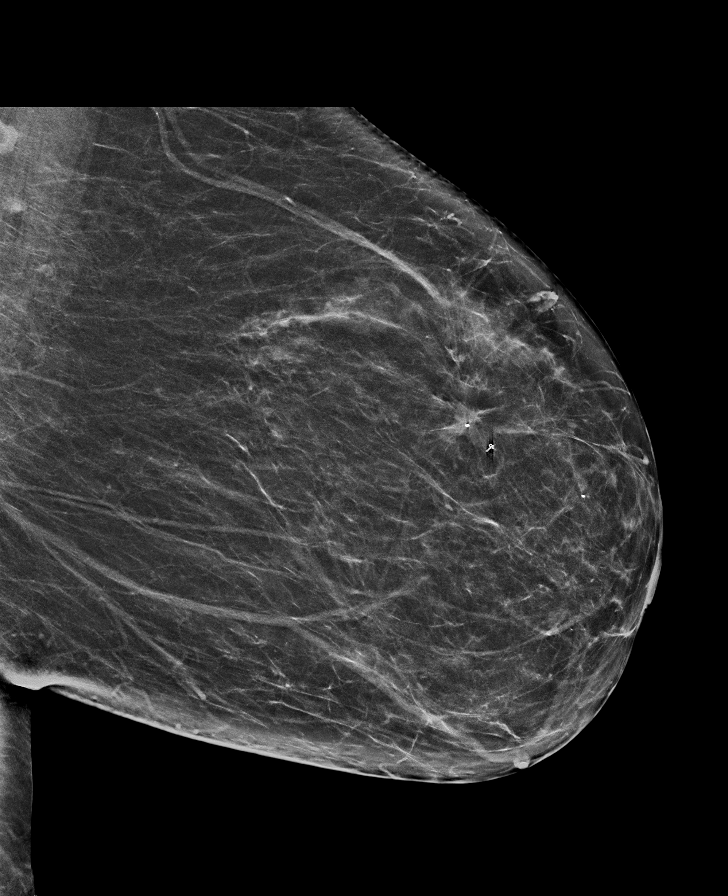

[R CC synth-2D]
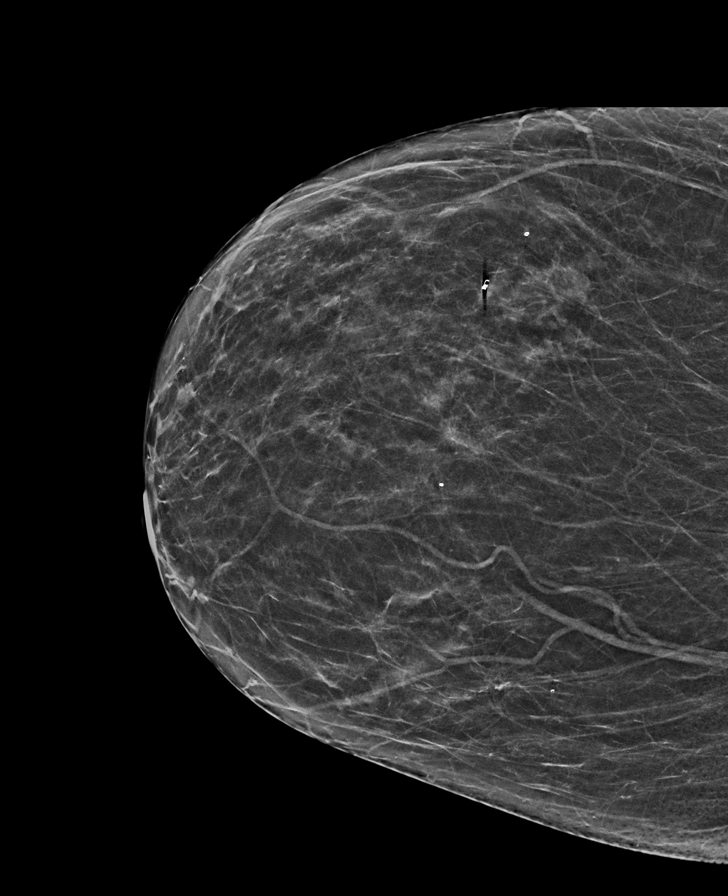

[L CC synth-2D]
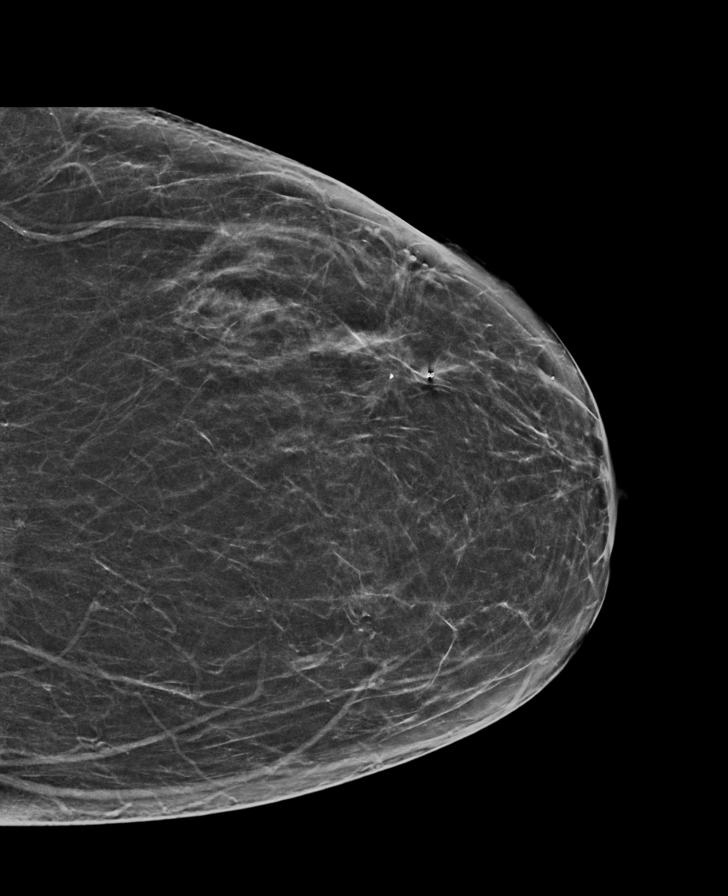

[R MLO synth-2D]
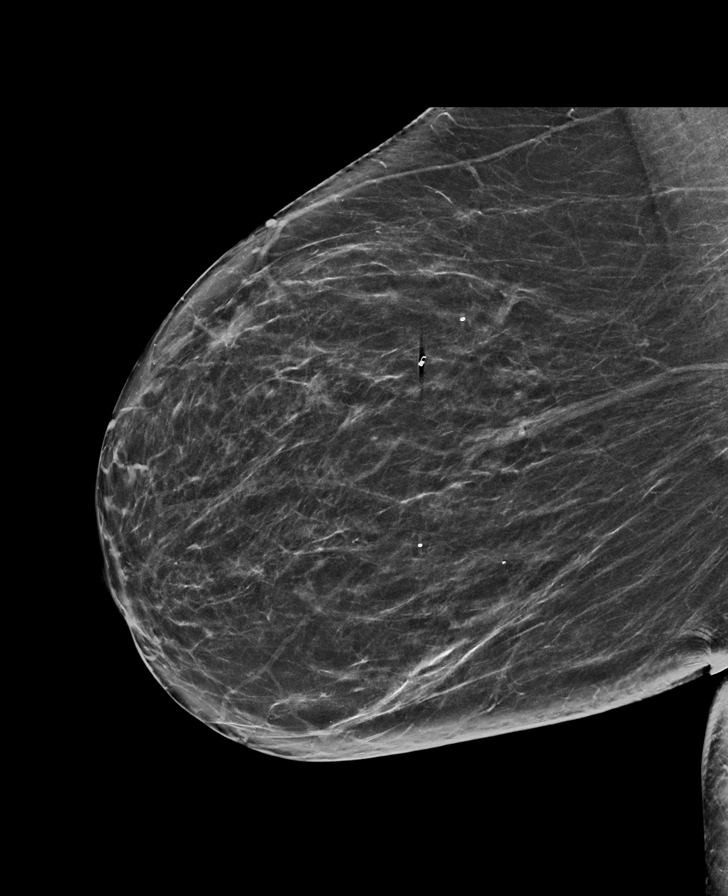

[L MLO tomo · tomo slice 37/72.0]
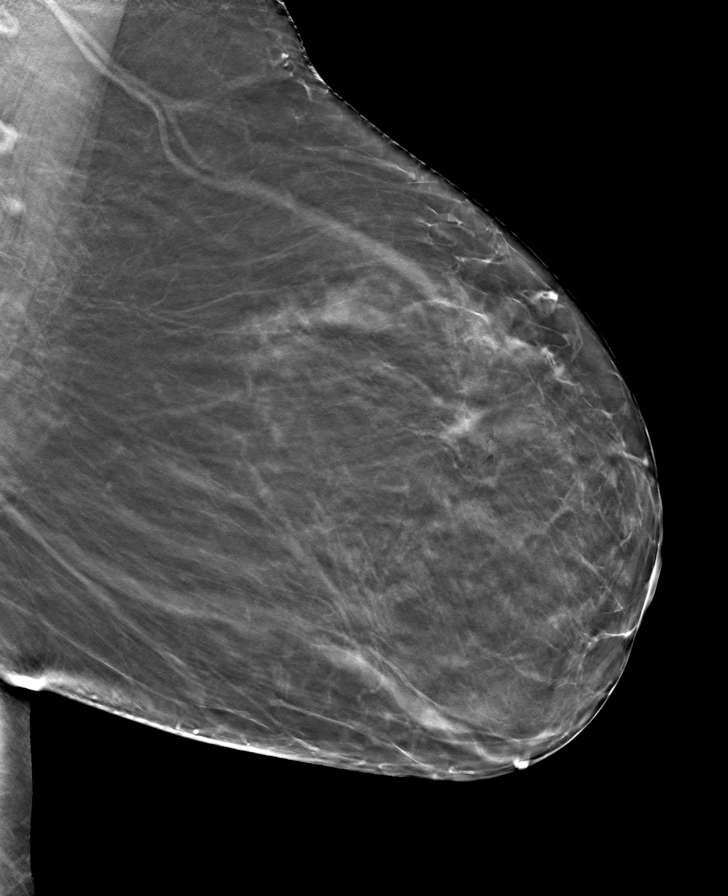

[R MLO tomo · tomo slice 35/68.0]
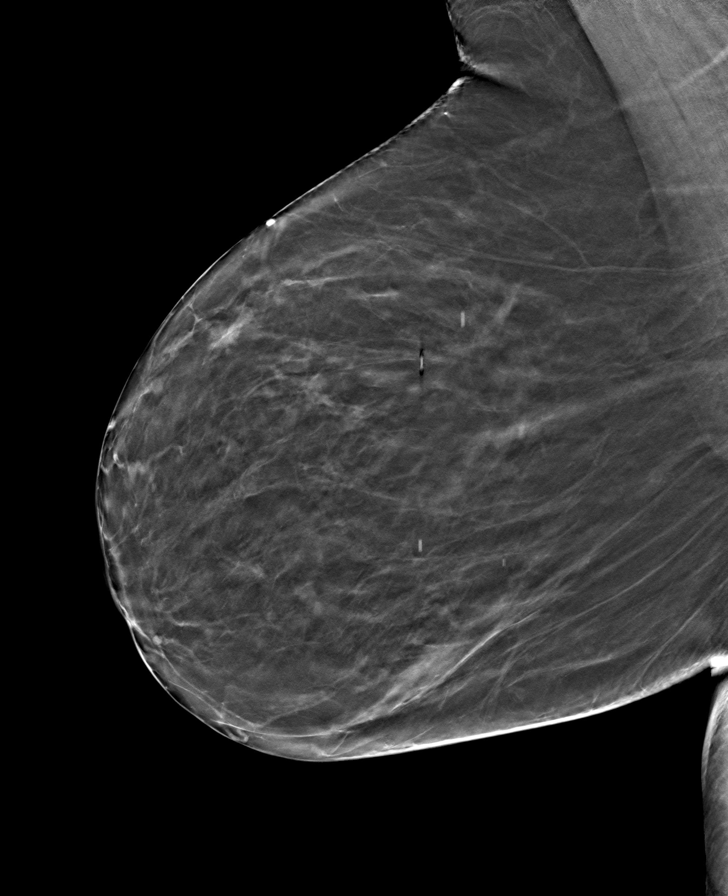

[L CC tomo · tomo slice 34/67.0]
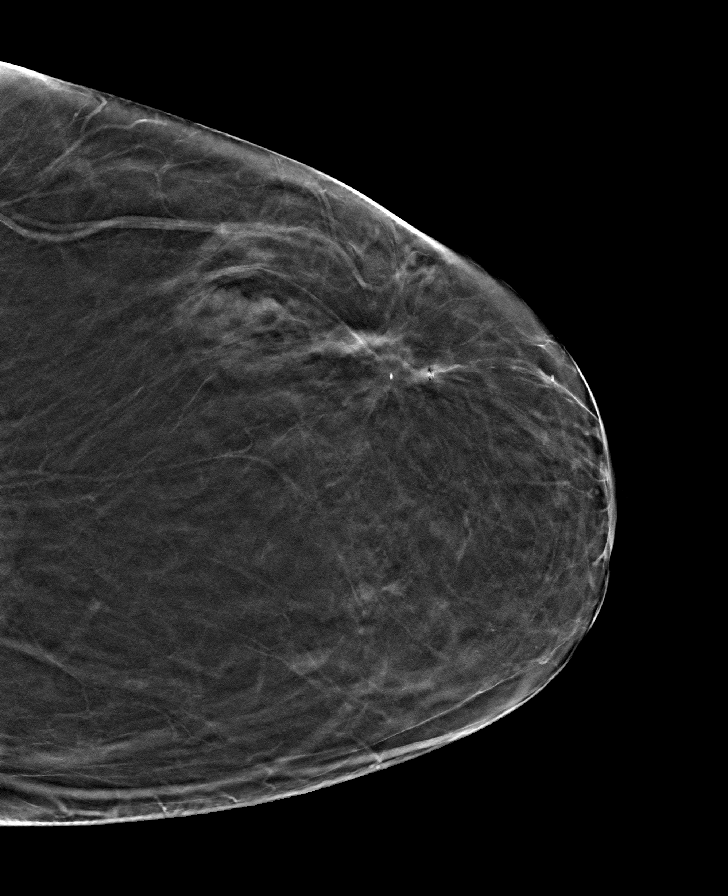

[R CC tomo · tomo slice 30/59.0]
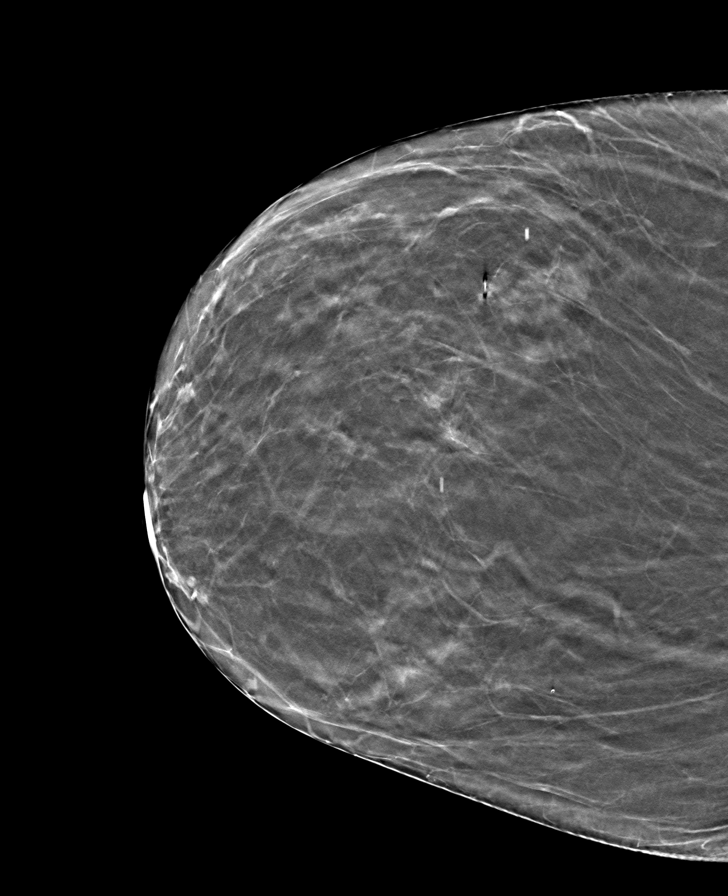

[8 of 24 positions shown; findings below may reference images not displayed]

ACR Breast Density Category b: There are scattered areas of
fibroglandular density.
FINDINGS: There are no findings suspicious for malignancy.
IMPRESSION: No mammographic evidence of malignancy. A result letter of this
screening mammogram will be mailed directly to the patient.

RECOMMENDATION:
Screening mammogram in one year. (Code:51-O-LD2)

BI-RADS CATEGORY  1: Negative.

## 2024-01-12 ENCOUNTER — Non-Acute Institutional Stay (SKILLED_NURSING_FACILITY): Payer: Self-pay | Admitting: Adult Health

## 2024-01-12 ENCOUNTER — Other Ambulatory Visit (HOSPITAL_COMMUNITY)
Admission: RE | Admit: 2024-01-12 | Discharge: 2024-01-12 | Disposition: A | Discharge status: Home / Self Care | Source: Skilled Nursing Facility | Attending: Adult Health | Admitting: Adult Health

## 2024-01-12 ENCOUNTER — Encounter: Payer: Self-pay | Admitting: Adult Health

## 2024-01-12 DIAGNOSIS — D649 Anemia, unspecified: Secondary | ICD-10-CM | POA: Insufficient documentation

## 2024-01-12 DIAGNOSIS — I480 Paroxysmal atrial fibrillation: Secondary | ICD-10-CM | POA: Diagnosis not present

## 2024-01-12 DIAGNOSIS — N3941 Urge incontinence: Secondary | ICD-10-CM | POA: Diagnosis not present

## 2024-01-12 DIAGNOSIS — I87303 Chronic venous hypertension (idiopathic) without complications of bilateral lower extremity: Secondary | ICD-10-CM | POA: Insufficient documentation

## 2024-01-12 DIAGNOSIS — Z86718 Personal history of other venous thrombosis and embolism: Secondary | ICD-10-CM

## 2024-01-12 DIAGNOSIS — R634 Abnormal weight loss: Secondary | ICD-10-CM | POA: Diagnosis not present

## 2024-01-12 DIAGNOSIS — E538 Deficiency of other specified B group vitamins: Secondary | ICD-10-CM

## 2024-01-12 DIAGNOSIS — E039 Hypothyroidism, unspecified: Secondary | ICD-10-CM | POA: Insufficient documentation

## 2024-01-12 DIAGNOSIS — E46 Unspecified protein-calorie malnutrition: Secondary | ICD-10-CM | POA: Diagnosis not present

## 2024-01-12 LAB — FOLATE: Folate: 10.7 ng/mL (ref >5.9)

## 2024-01-12 LAB — IRON AND TIBC
Iron: 77 ug/dL (ref 28–170)
Saturation Ratios: 23 % (ref 10.4–31.8)
TIBC: 334 ug/dL (ref 250–450)
UIBC: 257 ug/dL

## 2024-01-12 LAB — TSH: TSH: 2.081 u[IU]/mL (ref 0.350–4.500)

## 2024-01-12 LAB — VITAMIN B12: Vitamin B-12: 225 pg/mL (ref 180–914)

## 2024-01-12 NOTE — Progress Notes (Signed)
 Location:  [facility] Room Number: 101 Place of Service:  SNF (31)   CODE STATUS: full   Allergies  Allergen Reactions   Ticlid [Ticlopidine] Shortness Of Breath   Nsaids Other (See Comments)    Bleeding ulcer   Prednisone Other (See Comments)    Makes her stomach hurt--knows this isn't an allergy but doesn't want to take it    Trazodone And Nefazodone     hjallucinations   Monistat [Miconazole] Swelling and Rash   Myrbetriq Theodosia Paling Er] Rash    See 02/27/2023   Sulfa Antibiotics Rash    Chief Complaint  Patient presents with   Medical Management of Chronic Issues    HPI:    Past Medical History:  Diagnosis Date   Anemia    Arthritis    Asthma    Atrial fibrillation (HCC)    COVID-19 08/05/2022   Depression    Elevated C-reactive protein (CRP) 06/27/2023   H/O head injury    Hearing loss    Heart murmur    can be heard at times, Doctor said not to worry   History of kidney stones    History of stomach ulcers    Migraine    Pneumonia    Psittacosis    Stroke (HCC)    Weakness of neck 03/02/2013    Past Surgical History:  Procedure Laterality Date   ANKLE FRACTURE SURGERY Left    APPENDECTOMY     BREAST LUMPECTOMY     CESAREAN SECTION     x 3   I & D KNEE WITH POLY EXCHANGE Left 08/16/2022   Procedure: KNEE POLY EXCHANGE;  Surgeon: Bjorn Pippin, MD;  Location: [facility] OR;  Service: Orthopedics;  Laterality: Left;   KNEE ARTHROSCOPY WITH PATELLAR TENDON REPAIR Left 08/16/2022   Procedure: IRRIGATION AND DEBRIDEMENT LEFT KNEE REVISION WITH PATELLAR TENDON REPAIR and poly exchange;  Surgeon: Bjorn Pippin, MD;  Location: [facility] OR;  Service: Orthopedics;  Laterality: Left;   PARTIAL HYSTERECTOMY     ROTATOR CUFF REPAIR Right    TOTAL KNEE ARTHROPLASTY Left 07/22/2022   Procedure: TOTAL KNEE ARTHROPLASTY;  Surgeon: Joen Laura, MD;  Location: [facility];  Service: Orthopedics;  Laterality: Left;    Social History   Socioeconomic  History   Marital status: Married    Spouse name: Not on file   Number of children: 3   Years of education: Not on file   Highest education level: Not on file  Occupational History   Occupation: retired  Tobacco Use   Smoking status: Never   Smokeless tobacco: Never  Vaping Use   Vaping status: Never Used  Substance and Sexual Activity   Alcohol use: Not Currently    Comment: occ beer   Drug use: No   Sexual activity: Yes    Birth control/protection: Surgical    Comment: hyst  Other Topics Concern   Not on file  Social History Narrative   Not on file   Social Drivers of Health   Financial Resource Strain: Medium Risk (01/30/2021)   Overall Financial Resource Strain (CARDIA)    Difficulty of Paying Living Expenses: Somewhat hard  Food Insecurity: No Food Insecurity (08/15/2022)   Hunger Vital Sign    Worried About Running Out of Food in the Last Year: Never true    Ran Out of Food in the Last Year: Never true  Transportation Needs: No Transportation Needs (08/15/2022)   PRAPARE - Transportation  Lack of Transportation (Medical): No    Lack of Transportation (Non-Medical): No  Physical Activity: Insufficiently Active (01/30/2021)   Exercise Vital Sign    Days of Exercise per Week: 2 days    Minutes of Exercise per Session: 10 min  Stress: No Stress Concern Present (01/30/2021)   [employer] of Occupational Health - Occupational Stress Questionnaire    Feeling of Stress : Only a little  Social Connections: Moderately Integrated (01/30/2021)   Social Connection and Isolation Panel [NHANES]    Frequency of Communication with Friends and Family: Twice a week    Frequency of Social Gatherings with Friends and Family: Twice a week    Attends Religious Services: More than 4 times per year    Active Member of [employer] or Organizations: No    Attends Banker Meetings: Never    Marital Status: Married  Catering manager Violence: Not At Risk (08/15/2022)    Humiliation, Afraid, Rape, and Kick questionnaire    Fear of Current or Ex-Partner: No    Emotionally Abused: No    Physically Abused: No    Sexually Abused: No   Family History  Problem Relation Age of Onset   Cancer Father    Heart disease Brother    Alcohol abuse Brother    Depression Maternal Grandmother    Depression Grandchild    Breast cancer Daughter       VITAL SIGNS BP (!) 123/58   Pulse 76   Temp (!) 97.5 F (36.4 C)   Resp (!) 24   Ht 5' (1.524 m)   Wt 171 lb 9.6 oz (77.8 kg)   SpO2 98%   BMI 33.51 kg/m   Outpatient Encounter Medications as of 01/12/2024  Medication Sig   acetaminophen (TYLENOL) 650 MG CR tablet Take 650 mg by mouth every 8 (eight) hours.   apixaban (ELIQUIS) 5 MG TABS tablet Take 1 tablet (5 mg total) by mouth 2 (two) times daily.   ARIPiprazole (ABILIFY) 5 MG tablet Take 5 mg by mouth daily.   busPIRone (BUSPAR) 10 MG tablet Take 20 mg by mouth 2 (two) times daily.   donepezil (ARICEPT) 10 MG tablet Take 10 mg by mouth at bedtime.   DULoxetine (CYMBALTA) 60 MG capsule Take 60 mg by mouth daily.   eszopiclone (LUNESTA) 2 MG TABS tablet Take 1 tablet (2 mg total) by mouth at bedtime. Take immediately before bedtime   fexofenadine (ALLEGRA) 180 MG tablet Take 180 mg by mouth daily.   furosemide (LASIX) 20 MG tablet Take 20 mg by mouth.   Magnesium Hydroxide (MILK OF MAGNESIA PO) Take 30 mLs by mouth as needed (constipation).   metoprolol tartrate (LOPRESSOR) 25 MG tablet Take 0.5 tablets (12.5 mg total) by mouth 2 (two) times daily.   modafinil (PROVIGIL) 200 MG tablet Take 0.5 tablets (100 mg total) by mouth daily.   rosuvastatin (CRESTOR) 5 MG tablet Take 5 mg by mouth at bedtime.   senna (SENOKOT) 8.6 MG TABS tablet Take 1 tablet (8.6 mg total) by mouth 2 (two) times daily.   SUMAtriptan (IMITREX) 100 MG tablet Take 100 mg by mouth every 2 (two) hours as needed for migraine or headache. May repeat in 2 hours if headache persists or recurs.    tetrabenazine (XENAZINE) 12.5 MG tablet Take 12.5 mg by mouth 2 (two) times daily.   UNABLE TO FIND Diet: Regular   Vitamins A & D (VITAMIN A & D) ointment Apply 1 Application topically 2 (two) times  daily. To left heel   ZINC OXIDE EX Apply 1 Application topically See admin instructions. every shift and prn Special Instructions: Apply to sacrum, bilateral buttocks and coccyx q shift for redness.   No facility-administered encounter medications on file as of 01/12/2024.     SIGNIFICANT DIAGNOSTIC EXAMS  LABS REVIEWED PREVIOUS   11-14-22: hepatitis C nr 02-28-23: wbc 7.6; hgb 12.1; hct 375; mcv 100.5 plt 216   03-24-23: wbc 8.2; hgb 12.6; hct 39.5; mcv 101.1 plt 201; hgb A1c 5.4 chol 197; ldl 125; hdl 91; hdl 54  06-26-23: wbc 6.1; hgb 11.7; hct 36.2; mcv 98.4 plt 160; glucose 97; bun 14; creat 0.57; k+ 3.9; na++ 135; ca 8.7; gfr >60 d-dimer 0.28  CRP 3.0 10-16-23: chol 136; ldl 66; trig 70 hdl 56 11-06-23: glucose 154; bun 27; creat 0.80; k+ 3.6; na++ 139; ca 9.3 gfr >60    TODAY  12-11-23: wbc 5.0; hgb 11.9; hct 36.7; mcv 99.5 plt 181; hgb A1c 5.3; tsh 2.295 01-12-24: vitamin B12: 225; folate 10.7; tsh 2.081; iron 77 tibc 334  Review of Systems  Constitutional:  Negative for malaise/fatigue.  Respiratory:  Negative for cough and shortness of breath.   Cardiovascular:  Negative for chest pain, palpitations and leg swelling.  Gastrointestinal:  Negative for abdominal pain, constipation and heartburn.  Musculoskeletal:  Negative for back pain, joint pain and myalgias.  Skin: Negative.   Neurological:  Negative for dizziness.  Psychiatric/Behavioral:  The patient is not nervous/anxious.    Physical Exam Constitutional:      General: She is not in acute distress.    Appearance: She is well-developed. She is obese. She is not diaphoretic.  Neck:     Thyroid: No thyromegaly.  Cardiovascular:     Rate and Rhythm: Normal rate and regular rhythm.     Pulses: Normal pulses.     Heart sounds:  Normal heart sounds.  Pulmonary:     Effort: Pulmonary effort is normal. No respiratory distress.     Breath sounds: Normal breath sounds.  Abdominal:     General: Bowel sounds are normal. There is no distension.     Palpations: Abdomen is soft.     Tenderness: There is no abdominal tenderness.  Musculoskeletal:        General: Normal range of motion.     Cervical back: Neck supple.     Right lower leg: No edema.     Left lower leg: No edema.  Lymphadenopathy:     Cervical: No cervical adenopathy.  Skin:    General: Skin is warm and dry.  Neurological:     Mental Status: She is alert. Mental status is at baseline.     Comments: Has abnormal facial movements and hand tremors; is less today       10-14-22: SLUMS 18/30    Psychiatric:        Mood and Affect: Mood normal.        ASSESSMENT/ PLAN:  TODAY  Protein calorie malnutrition: will continue supplements as directed  2. Vitamin B 12: deficiency: level is 225 will begin 1,000 mcg daily   3. Urinary incontinence: unable to tolerate myrbetriq  4. Longstanding persistent atrial fibrillation/history of DVT: heart rate is stable will continue eliquis 5 mg twice daily and is taking lopressor 25 mg twice daily for rate control.   PREVIOUS   5. Migraine without status migrainosus, nonintractable unspecified type:  6.  Uncomplicated asthma unspecified asthma severity unspecified: unspecified whether persistent: is on  allegra 180 mg daily   7. Chronic constipation: will continue senna s twice daily   8. Vascular dementia without behavioral disturbance/neurocognitive deficit: weight is 171 pounds; will continue aricept 10 mg daily   9. Tardive dyskinesia: does continue to have abnormal facial and hand movements; has failed ingrezza/austeda  therapy; will continue tetrabenzine 12.5 mg twice daily .   10. Chronic pain syndrome/periprosthetic fracture around internal prosthetic left knee joint sequela: is on cymbalta 60 mg daily  and tylenol 650 mg nightly   11. Bilateral lower extremity edema: will continue lasix 20 mg daily   12. Chronic anemia: hgb 11.9; iron studies normal    13. Chronic insomnia: will continue lunesta 2 mg nightly   14. Psychosis in elderly/major depression with psychotic features: will continue abilify 5 mg daily cymbalta 60 mg daily buspar 20 mg twice daily   15. Somnolence: will continue provigil 100 mg daily    Synthia Innocent NP [facility] Medicine   call [phone]

## 2024-01-17 DIAGNOSIS — E538 Deficiency of other specified B group vitamins: Secondary | ICD-10-CM | POA: Insufficient documentation

## 2024-01-19 DIAGNOSIS — F411 Generalized anxiety disorder: Secondary | ICD-10-CM | POA: Diagnosis not present

## 2024-01-19 DIAGNOSIS — F5105 Insomnia due to other mental disorder: Secondary | ICD-10-CM | POA: Diagnosis not present

## 2024-01-19 DIAGNOSIS — F332 Major depressive disorder, recurrent severe without psychotic features: Secondary | ICD-10-CM | POA: Diagnosis not present

## 2024-01-19 DIAGNOSIS — G2401 Drug induced subacute dyskinesia: Secondary | ICD-10-CM | POA: Diagnosis not present

## 2024-01-30 ENCOUNTER — Non-Acute Institutional Stay (SKILLED_NURSING_FACILITY): Payer: Self-pay | Admitting: Adult Health

## 2024-01-30 ENCOUNTER — Encounter: Payer: Self-pay | Admitting: Adult Health

## 2024-01-30 DIAGNOSIS — F0390 Unspecified dementia without behavioral disturbance: Secondary | ICD-10-CM

## 2024-01-30 DIAGNOSIS — I480 Paroxysmal atrial fibrillation: Secondary | ICD-10-CM

## 2024-01-30 DIAGNOSIS — F0150 Vascular dementia without behavioral disturbance: Secondary | ICD-10-CM | POA: Diagnosis not present

## 2024-01-30 NOTE — Progress Notes (Signed)
 Location:  [facility] Room Number: 101 Place of Service:   SNF    CODE STATUS: full   Allergies  Allergen Reactions   Ticlid [Ticlopidine] Shortness Of Breath   Nsaids Other (See Comments)    Bleeding ulcer   Prednisone Other (See Comments)    Makes her stomach hurt--knows this isn't an allergy but doesn't want to take it    Trazodone And Nefazodone     hjallucinations   Monistat [Miconazole] Swelling and Rash   Myrbetriq [name]] Rash    See 02/27/2023   Sulfa Antibiotics Rash    Chief Complaint  Patient presents with   Acute Visit    Care plan meeting.     HPI:  We have come together for her care plan meeting. Family present  BIMS 15/15 mood 11/30: depression; decreased energy; nervous; trouble concentrating. Uses wheelchair with no falls. She requires moderate assist with her adl care. She is incontinent of bladder and frequently incontinent of bowel. Dietary: regular diet independent with meals; appetite 76-100%  weight is 169 pounds. Therapy: none at this time. She will continue to be followed for her chronic illnesses including:   Vascular dementia without behavioral disturbance  Psychosis in elderly  (PAF) paroxsymal atrial fibrillation  Past Medical History:  Diagnosis Date   Anemia    Arthritis    Asthma    Atrial fibrillation (HCC)    COVID-19 08/05/2022   Depression    Elevated C-reactive protein (CRP) 06/27/2023   H/O head injury    Hearing loss    Heart murmur    can be heard at times, Doctor said not to worry   History of kidney stones    History of stomach ulcers    Migraine    Pneumonia    Psittacosis    Stroke (HCC)    Weakness of neck 03/02/2013    Past Surgical History:  Procedure Laterality Date   ANKLE FRACTURE SURGERY Left    APPENDECTOMY     BREAST LUMPECTOMY     CESAREAN SECTION     x 3   I & D KNEE WITH POLY EXCHANGE Left 08/16/2022   Procedure: KNEE POLY EXCHANGE;  Surgeon: Bjorn Pippin, MD;   Location: [facility] OR;  Service: Orthopedics;  Laterality: Left;   KNEE ARTHROSCOPY WITH PATELLAR TENDON REPAIR Left 08/16/2022   Procedure: IRRIGATION AND DEBRIDEMENT LEFT KNEE REVISION WITH PATELLAR TENDON REPAIR and poly exchange;  Surgeon: Bjorn Pippin, MD;  Location: [facility] OR;  Service: Orthopedics;  Laterality: Left;   PARTIAL HYSTERECTOMY     ROTATOR CUFF REPAIR Right    TOTAL KNEE ARTHROPLASTY Left 07/22/2022   Procedure: TOTAL KNEE ARTHROPLASTY;  Surgeon: Joen Laura, MD;  Location: [facility]S;  Service: Orthopedics;  Laterality: Left;    Social History   Socioeconomic History   Marital status: Married    Spouse name: Not on file   Number of children: 3   Years of education: Not on file   Highest education level: Not on file  Occupational History   Occupation: retired  Tobacco Use   Smoking status: Never   Smokeless tobacco: Never  Vaping Use   Vaping status: Never Used  Substance and Sexual Activity   Alcohol use: Not Currently    Comment: occ beer   Drug use: No   Sexual activity: Yes    Birth control/protection: Surgical    Comment: hyst  Other Topics Concern   Not on file  Social History Narrative   Not on file   Social Drivers of Health   Financial Resource Strain: Medium Risk (01/30/2021)   Overall Financial Resource Strain (CARDIA)    Difficulty of Paying Living Expenses: Somewhat hard  Food Insecurity: No Food Insecurity (08/15/2022)   Hunger Vital Sign    Worried About Running Out of Food in the Last Year: Never true    Ran Out of Food in the Last Year: Never true  Transportation Needs: No Transportation Needs (08/15/2022)   PRAPARE - Administrator, Civil Service (Medical): No    Lack of Transportation (Non-Medical): No  Physical Activity: Insufficiently Active (01/30/2021)   Exercise Vital Sign    Days of Exercise per Week: 2 days    Minutes of Exercise per Session: 10 min  Stress: No Stress Concern Present (01/30/2021)   [employer] of  Occupational Health - Occupational Stress Questionnaire    Feeling of Stress : Only a little  Social Connections: Moderately Integrated (01/30/2021)   Social Connection and Isolation Panel [NHANES]    Frequency of Communication with Friends and Family: Twice a week    Frequency of Social Gatherings with Friends and Family: Twice a week    Attends Religious Services: More than 4 times per year    Active Member of [employer] or Organizations: No    Attends Banker Meetings: Never    Marital Status: Married  Catering manager Violence: Not At Risk (08/15/2022)   Humiliation, Afraid, Rape, and Kick questionnaire    Fear of Current or Ex-Partner: No    Emotionally Abused: No    Physically Abused: No    Sexually Abused: No   Family History  Problem Relation Age of Onset   Cancer Father    Heart disease Brother    Alcohol abuse Brother    Depression Maternal Grandmother    Depression Grandchild    Breast cancer Daughter       VITAL SIGNS BP 132/70   Pulse 74   Temp (!) 97.2 F (36.2 C)   Resp (!) 22   Ht 5' (1.524 m)   Wt 169 lb (76.7 kg)   SpO2 98%   BMI 33.01 kg/m   Outpatient Encounter Medications as of 01/30/2024  Medication Sig   acetaminophen (TYLENOL) 650 MG CR tablet Take 650 mg by mouth every 8 (eight) hours.   apixaban (ELIQUIS) 5 MG TABS tablet Take 1 tablet (5 mg total) by mouth 2 (two) times daily.   ARIPiprazole (ABILIFY) 5 MG tablet Take 5 mg by mouth daily.   busPIRone (BUSPAR) 10 MG tablet Take 20 mg by mouth 2 (two) times daily.   cyanocobalamin 1000 MCG tablet Take 1,000 mcg by mouth daily.   donepezil (ARICEPT) 10 MG tablet Take 10 mg by mouth at bedtime.   DULoxetine (CYMBALTA) 60 MG capsule Take 60 mg by mouth daily.   eszopiclone (LUNESTA) 2 MG TABS tablet Take 1 tablet (2 mg total) by mouth at bedtime. Take immediately before bedtime   fexofenadine (ALLEGRA) 180 MG tablet Take 180 mg by mouth daily.   furosemide (LASIX) 20 MG tablet Take 20 mg  by mouth.   Magnesium Hydroxide (MILK OF MAGNESIA PO) Take 30 mLs by mouth as needed (constipation).   metoprolol tartrate (LOPRESSOR) 25 MG tablet Take 0.5 tablets (12.5 mg total) by mouth 2 (two) times daily.   modafinil (PROVIGIL) 200 MG tablet Take 0.5 tablets (100 mg total) by mouth daily.   rosuvastatin (CRESTOR)  5 MG tablet Take 5 mg by mouth at bedtime.   senna (SENOKOT) 8.6 MG TABS tablet Take 1 tablet (8.6 mg total) by mouth 2 (two) times daily.   SUMAtriptan (IMITREX) 100 MG tablet Take 100 mg by mouth every 2 (two) hours as needed for migraine or headache. May repeat in 2 hours if headache persists or recurs.   tetrabenazine (XENAZINE) 12.5 MG tablet Take 12.5 mg by mouth 2 (two) times daily.   UNABLE TO FIND Diet: Regular   Vitamins A & D (VITAMIN A & D) ointment Apply 1 Application topically 2 (two) times daily. To left heel   ZINC OXIDE EX Apply 1 Application topically See admin instructions. every shift and prn Special Instructions: Apply to sacrum, bilateral buttocks and coccyx q shift for redness.   No facility-administered encounter medications on file as of 01/30/2024.     SIGNIFICANT DIAGNOSTIC EXAMS  LABS REVIEWED PREVIOUS   11-14-22: hepatitis C nr 02-28-23: wbc 7.6; hgb 12.1; hct 375; mcv 100.5 plt 216   03-24-23: wbc 8.2; hgb 12.6; hct 39.5; mcv 101.1 plt 201; hgb A1c 5.4 chol 197; ldl 125; hdl 91; hdl 54  06-26-23: wbc 6.1; hgb 11.7; hct 36.2; mcv 98.4 plt 160; glucose 97; bun 14; creat 0.57; k+ 3.9; na++ 135; ca 8.7; gfr >60 d-dimer 0.28  CRP 3.0 10-16-23: chol 136; ldl 66; trig 70 hdl 56 11-06-23: glucose 154; bun 27; creat 0.80; k+ 3.6; na++ 139; ca 9.3 gfr >60    TODAY  12-11-23: wbc 5.0; hgb 11.9; hct 36.7; mcv 99.5 plt 181; hgb A1c 5.3; tsh 2.295 01-12-24: vitamin B12: 225; folate 10.7; tsh 2.081; iron 77 tibc 334  Review of Systems  Constitutional:  Negative for malaise/fatigue.  Respiratory:  Negative for cough and shortness of breath.   Cardiovascular:   Negative for chest pain, palpitations and leg swelling.  Gastrointestinal:  Negative for abdominal pain, constipation and heartburn.  Musculoskeletal:  Negative for back pain, joint pain and myalgias.  Skin: Negative.   Neurological:  Negative for dizziness.  Psychiatric/Behavioral:  The patient is not nervous/anxious.    Physical Exam Constitutional:      General: She is not in acute distress.    Appearance: She is well-developed. She is obese. She is not diaphoretic.  Neck:     Thyroid: No thyromegaly.  Cardiovascular:     Rate and Rhythm: Normal rate and regular rhythm.     Pulses: Normal pulses.     Heart sounds: Normal heart sounds.  Pulmonary:     Effort: Pulmonary effort is normal. No respiratory distress.     Breath sounds: Normal breath sounds.  Abdominal:     General: Bowel sounds are normal. There is no distension.     Palpations: Abdomen is soft.     Tenderness: There is no abdominal tenderness.  Musculoskeletal:        General: Normal range of motion.     Cervical back: Neck supple.     Right lower leg: No edema.     Left lower leg: No edema.  Lymphadenopathy:     Cervical: No cervical adenopathy.  Skin:    General: Skin is warm and dry.  Neurological:     Mental Status: She is alert. Mental status is at baseline.     Comments:  Has abnormal facial movements and hand tremors; is less today       10-14-22: SLUMS 18/30    Psychiatric:        Mood  and Affect: Mood normal.    ASSESSMENT/ PLAN:  TODAY  Vascular dementia without behavioral disturbance Psychosis in elderly (PAF) paroxsymal atrial fibrillation  Will continue current medications Will continue current plan of care Will continue to monitor her status.   Time spent with patient: 40 minutes: medications; dietary; plan of care.    Synthia Innocent NP [facility]   call [phone]

## 2024-02-11 ENCOUNTER — Encounter: Payer: Self-pay | Admitting: Adult Health

## 2024-02-11 ENCOUNTER — Non-Acute Institutional Stay (SKILLED_NURSING_FACILITY): Payer: Self-pay | Admitting: Adult Health

## 2024-02-11 DIAGNOSIS — G2401 Drug induced subacute dyskinesia: Secondary | ICD-10-CM

## 2024-02-11 DIAGNOSIS — K5909 Other constipation: Secondary | ICD-10-CM

## 2024-02-11 DIAGNOSIS — G43909 Migraine, unspecified, not intractable, without status migrainosus: Secondary | ICD-10-CM | POA: Diagnosis not present

## 2024-02-11 DIAGNOSIS — J3089 Other allergic rhinitis: Secondary | ICD-10-CM | POA: Diagnosis not present

## 2024-02-11 NOTE — Progress Notes (Signed)
 Location:  [facility] Room Number: 101 Place of Service:  SNF (31)   CODE STATUS: full  Allergies  Allergen Reactions   Ticlid [Ticlopidine] Shortness Of Breath   Nsaids Other (See Comments)    Bleeding ulcer   Prednisone Other (See Comments)    Makes her stomach hurt--knows this isn't an allergy but doesn't want to take it    Trazodone And Nefazodone     hjallucinations   Monistat [Miconazole] Swelling and Rash   Myrbetriq Theodosia Paling Er] Rash    See 02/27/2023   Sulfa Antibiotics Rash    Chief Complaint  Patient presents with   Medical Management of Chronic Issues         Tardive dyskinesia: Migraine without status migrainosus, non intractable unspecified; type: Uncomplicated asthma unspecified asthma severity unspecified, unspecified whether persistent:Chronic constipation:     HPI:  She is a 78 year old long term resident of this facility being seen for the management of her chronic illnesses: Tardive dyskinesia: Migraine without status migrainosus, non intractable unspecified; type: Uncomplicated asthma unspecified asthma severity unspecified, unspecified whether persistent:Chronic constipation. There are no reports of uncontrolled pain. She has lost weight this past month with her current weight at 166 pounds. She continues with abnormal facial movements.   Past Medical History:  Diagnosis Date   Anemia    Arthritis    Asthma    Atrial fibrillation (HCC)    COVID-19 08/05/2022   Depression    Elevated C-reactive protein (CRP) 06/27/2023   H/O head injury    Hearing loss    Heart murmur    can be heard at times, Doctor said not to worry   History of kidney stones    History of stomach ulcers    Migraine    Pneumonia    Psittacosis    Stroke (HCC)    Weakness of neck 03/02/2013    Past Surgical History:  Procedure Laterality Date   ANKLE FRACTURE SURGERY Left    APPENDECTOMY     BREAST LUMPECTOMY     CESAREAN SECTION     x 3    I & D KNEE WITH POLY EXCHANGE Left 08/16/2022   Procedure: KNEE POLY EXCHANGE;  Surgeon: Bjorn Pippin, MD;  Location: [facility] OR;  Service: Orthopedics;  Laterality: Left;   KNEE ARTHROSCOPY WITH PATELLAR TENDON REPAIR Left 08/16/2022   Procedure: IRRIGATION AND DEBRIDEMENT LEFT KNEE REVISION WITH PATELLAR TENDON REPAIR and poly exchange;  Surgeon: Bjorn Pippin, MD;  Location: [facility] OR;  Service: Orthopedics;  Laterality: Left;   PARTIAL HYSTERECTOMY     ROTATOR CUFF REPAIR Right    TOTAL KNEE ARTHROPLASTY Left 07/22/2022   Procedure: TOTAL KNEE ARTHROPLASTY;  Surgeon: Joen Laura, MD;  Location: [facility];  Service: Orthopedics;  Laterality: Left;    Social History   Socioeconomic History   Marital status: Married    Spouse name: Not on file   Number of children: 3   Years of education: Not on file   Highest education level: Not on file  Occupational History   Occupation: retired  Tobacco Use   Smoking status: Never   Smokeless tobacco: Never  Vaping Use   Vaping status: Never Used  Substance and Sexual Activity   Alcohol use: Not Currently    Comment: occ beer   Drug use: No   Sexual activity: Yes    Birth control/protection: Surgical    Comment: hyst  Other Topics Concern   Not  on file  Social History Narrative   Not on file   Social Drivers of Health   Financial Resource Strain: Medium Risk (01/30/2021)   Overall Financial Resource Strain (CARDIA)    Difficulty of Paying Living Expenses: Somewhat hard  Food Insecurity: No Food Insecurity (08/15/2022)   Hunger Vital Sign    Worried About Running Out of Food in the Last Year: Never true    Ran Out of Food in the Last Year: Never true  Transportation Needs: No Transportation Needs (08/15/2022)   PRAPARE - Administrator, Civil Service (Medical): No    Lack of Transportation (Non-Medical): No  Physical Activity: Insufficiently Active (01/30/2021)   Exercise Vital Sign    Days of Exercise per Week: 2 days     Minutes of Exercise per Session: 10 min  Stress: No Stress Concern Present (01/30/2021)   [employer] of Occupational Health - Occupational Stress Questionnaire    Feeling of Stress : Only a little  Social Connections: Moderately Integrated (01/30/2021)   Social Connection and Isolation Panel [NHANES]    Frequency of Communication with Friends and Family: Twice a week    Frequency of Social Gatherings with Friends and Family: Twice a week    Attends Religious Services: More than 4 times per year    Active Member of [employer] or Organizations: No    Attends Banker Meetings: Never    Marital Status: Married  Catering manager Violence: Not At Risk (08/15/2022)   Humiliation, Afraid, Rape, and Kick questionnaire    Fear of Current or Ex-Partner: No    Emotionally Abused: No    Physically Abused: No    Sexually Abused: No   Family History  Problem Relation Age of Onset   Cancer Father    Heart disease Brother    Alcohol abuse Brother    Depression Maternal Grandmother    Depression Grandchild    Breast cancer Daughter       VITAL SIGNS BP 118/67   Pulse 92   Temp (!) 97.5 F (36.4 C)   Resp 20   Ht 5' (1.524 m)   Wt 166 lb (75.3 kg)   SpO2 97%   BMI 32.42 kg/m   Outpatient Encounter Medications as of 02/11/2024  Medication Sig   acetaminophen (TYLENOL) 650 MG CR tablet Take 650 mg by mouth every 8 (eight) hours.   apixaban (ELIQUIS) 5 MG TABS tablet Take 1 tablet (5 mg total) by mouth 2 (two) times daily.   ARIPiprazole (ABILIFY) 5 MG tablet Take 5 mg by mouth daily.   busPIRone (BUSPAR) 10 MG tablet Take 10 mg by mouth 3 (three) times daily.   cyanocobalamin 1000 MCG tablet Take 1,000 mcg by mouth daily.   donepezil (ARICEPT) 10 MG tablet Take 10 mg by mouth at bedtime.   DULoxetine (CYMBALTA) 60 MG capsule Take 60 mg by mouth daily.   eszopiclone (LUNESTA) 1 MG TABS tablet Take 1 mg by mouth at bedtime. Take immediately before bedtime   fexofenadine  (ALLEGRA) 180 MG tablet Take 180 mg by mouth daily.   furosemide (LASIX) 20 MG tablet Take 20 mg by mouth.   Magnesium Hydroxide (MILK OF MAGNESIA PO) Take 30 mLs by mouth as needed (constipation).   metoprolol tartrate (LOPRESSOR) 25 MG tablet Take 0.5 tablets (12.5 mg total) by mouth 2 (two) times daily.   modafinil (PROVIGIL) 200 MG tablet Take 0.5 tablets (100 mg total) by mouth daily.   rosuvastatin (CRESTOR) 5  MG tablet Take 5 mg by mouth at bedtime.   senna (SENOKOT) 8.6 MG TABS tablet Take 1 tablet (8.6 mg total) by mouth 2 (two) times daily.   SUMAtriptan (IMITREX) 100 MG tablet Take 100 mg by mouth every 2 (two) hours as needed for migraine or headache. May repeat in 2 hours if headache persists or recurs.   tetrabenazine (XENAZINE) 12.5 MG tablet Take 25 mg by mouth 2 (two) times daily.   UNABLE TO FIND Diet: Regular   Vitamins A & D (VITAMIN A & D) ointment Apply 1 Application topically 2 (two) times daily. To left heel   ZINC OXIDE EX Apply 1 Application topically See admin instructions. every shift and prn Special Instructions: Apply to sacrum, bilateral buttocks and coccyx q shift for redness.   No facility-administered encounter medications on file as of 02/11/2024.     SIGNIFICANT DIAGNOSTIC EXAMS  LABS REVIEWED PREVIOUS   11-14-22: hepatitis C nr 02-28-23: wbc 7.6; hgb 12.1; hct 375; mcv 100.5 plt 216   03-24-23: wbc 8.2; hgb 12.6; hct 39.5; mcv 101.1 plt 201; hgb A1c 5.4 chol 197; ldl 125; hdl 91; hdl 54  06-26-23: wbc 6.1; hgb 11.7; hct 36.2; mcv 98.4 plt 160; glucose 97; bun 14; creat 0.57; k+ 3.9; na++ 135; ca 8.7; gfr >60 d-dimer 0.28  CRP 3.0 10-16-23: chol 136; ldl 66; trig 70 hdl 56 11-06-23: glucose 154; bun 27; creat 0.80; k+ 3.6; na++ 139; ca 9.3 gfr >60   12-11-23: wbc 5.0; hgb 11.9; hct 36.7; mcv 99.5 plt 181; hgb A1c 5.3; tsh 2.295 01-12-24: vitamin B12: 225; folate 10.7; tsh 2.081; iron 77 tibc 334  NO NEW LABS.   Review of Systems  Constitutional:  Negative  for malaise/fatigue.  Respiratory:  Negative for cough and shortness of breath.   Cardiovascular:  Negative for chest pain, palpitations and leg swelling.  Gastrointestinal:  Negative for abdominal pain, constipation and heartburn.  Musculoskeletal:  Negative for back pain, joint pain and myalgias.  Skin: Negative.   Neurological:  Negative for dizziness.  Psychiatric/Behavioral:  The patient is not nervous/anxious.    Physical Exam Constitutional:      General: She is not in acute distress.    Appearance: She is well-developed. She is obese. She is not diaphoretic.  Neck:     Thyroid: No thyromegaly.  Cardiovascular:     Rate and Rhythm: Normal rate and regular rhythm.     Pulses: Normal pulses.     Heart sounds: Normal heart sounds.  Pulmonary:     Effort: Pulmonary effort is normal. No respiratory distress.     Breath sounds: Normal breath sounds.  Abdominal:     General: Bowel sounds are normal. There is no distension.     Palpations: Abdomen is soft.     Tenderness: There is no abdominal tenderness.  Musculoskeletal:        General: Normal range of motion.     Cervical back: Neck supple.     Right lower leg: No edema.     Left lower leg: No edema.     Comments:   Has abnormal facial movements and hand tremors;        10-14-22: SLUMS 18/30   Lymphadenopathy:     Cervical: No cervical adenopathy.  Skin:    General: Skin is warm and dry.  Neurological:     Mental Status: She is alert. Mental status is at baseline.  Psychiatric:        Mood and  Affect: Mood normal.       ASSESSMENT/ PLAN:  TODAY  Tardive dyskinesia: does have abnormal movements. Will increase tetrabenzine to 25 mg twice daily; has failed ingrezza and [place]   2. Migraine without status migrainosus, non intractable unspecified; type: has prn imitrex  3. Uncomplicated asthma unspecified asthma severity unspecified, unspecified whether persistent: is on allegra 180 mg daily   4. Chronic  constipation: will continue senna s twice daily    PREVIOUS   5. Vascular dementia without behavioral disturbance/neurocognitive deficit: weight is 166 pounds; will continue aricept 10 mg daily    6. Chronic pain syndrome/periprosthetic fracture around internal prosthetic left knee joint sequela: is on cymbalta 60 mg daily and tylenol 650 mg nightly   7. Bilateral lower extremity edema: will continue lasix 20 mg daily   8. Chronic anemia: hgb 11.9; iron studies normal    9. Chronic insomnia: will continue lunesta 1 mg nightly   10. Psychosis in elderly/major depression with psychotic features: will continue abilify 5 mg daily cymbalta 60 mg daily buspar 20 mg twice daily   11. Somnolence: will continue provigil 100 mg daily   12. Protein calorie malnutrition: will continue supplements as directed  13. Vitamin B 12: deficiency: level is 225 will begin 1,000 mcg daily   14. Urinary incontinence: unable to tolerate myrbetriq  15. Longstanding persistent atrial fibrillation/history of DVT: heart rate is stable will continue eliquis 5 mg twice daily and is taking lopressor 25 mg twice daily for rate control.    Synthia Innocent NP [facility]  call [phone]

## 2024-02-12 ENCOUNTER — Encounter: Payer: Self-pay | Admitting: Adult Health

## 2024-02-12 ENCOUNTER — Non-Acute Institutional Stay (SKILLED_NURSING_FACILITY): Payer: Self-pay | Admitting: Adult Health

## 2024-02-12 DIAGNOSIS — R634 Abnormal weight loss: Secondary | ICD-10-CM | POA: Diagnosis not present

## 2024-02-12 DIAGNOSIS — F0150 Vascular dementia without behavioral disturbance: Secondary | ICD-10-CM | POA: Diagnosis not present

## 2024-02-12 DIAGNOSIS — E46 Unspecified protein-calorie malnutrition: Secondary | ICD-10-CM

## 2024-02-12 NOTE — Progress Notes (Signed)
 Location:  [facility] Room Number: 101 Place of Service:  SNF (31)   CODE STATUS: full code   Allergies  Allergen Reactions   Ticlid [Ticlopidine] Shortness Of Breath   Nsaids Other (See Comments)    Bleeding ulcer   Prednisone Other (See Comments)    Makes her stomach hurt--knows this isn't an allergy but doesn't want to take it    Trazodone And Nefazodone     hjallucinations   Monistat [Miconazole] Swelling and Rash   Myrbetriq Theodosia Paling Er] Rash    See 02/27/2023   Sulfa Antibiotics Rash    Chief Complaint  Patient presents with   Acute Visit    Weight loss     HPI:  She is losing weight. She has lost 9 pounds over [place] past 6 months with 3 pounds lost last month her current weight is 166 pounds. She has had a recent medication change to tetrabenazine 25 mg twice daily. She had failed other medications for her TD. She is taking aricept 10 mg daily for her dementia. This medication can cause weight loss. There are no reports of worsening anxiety or depressive feelings.   Past Medical History:  Diagnosis Date   Anemia    Arthritis    Asthma    Atrial fibrillation (HCC)    COVID-19 08/05/2022   Depression    Elevated C-reactive protein (CRP) 06/27/2023   H/O head injury    Hearing loss    Heart murmur    can be heard at times, Doctor said not to worry   History of kidney stones    History of stomach ulcers    Migraine    Pneumonia    Psittacosis    Stroke (HCC)    Weakness of neck 03/02/2013    Past Surgical History:  Procedure Laterality Date   ANKLE FRACTURE SURGERY Left    APPENDECTOMY     BREAST LUMPECTOMY     CESAREAN SECTION     x 3   I & D KNEE WITH POLY EXCHANGE Left 08/16/2022   Procedure: KNEE POLY EXCHANGE;  Surgeon: Bjorn Pippin, MD;  Location: [facility] OR;  Service: Orthopedics;  Laterality: Left;   KNEE ARTHROSCOPY WITH PATELLAR TENDON REPAIR Left 08/16/2022   Procedure: IRRIGATION AND DEBRIDEMENT LEFT KNEE REVISION  WITH PATELLAR TENDON REPAIR and poly exchange;  Surgeon: Bjorn Pippin, MD;  Location: [facility] OR;  Service: Orthopedics;  Laterality: Left;   PARTIAL HYSTERECTOMY     ROTATOR CUFF REPAIR Right    TOTAL KNEE ARTHROPLASTY Left 07/22/2022   Procedure: TOTAL KNEE ARTHROPLASTY;  Surgeon: Joen Laura, MD;  Location: [facility] ORS;  Service: Orthopedics;  Laterality: Left;    Social History   Socioeconomic History   Marital status: Married    Spouse name: Not on file   Number of children: 3   Years of education: Not on file   Highest education level: Not on file  Occupational History   Occupation: retired  Tobacco Use   Smoking status: Never   Smokeless tobacco: Never  Vaping Use   Vaping status: Never Used  Substance and Sexual Activity   Alcohol use: Not Currently    Comment: occ beer   Drug use: No   Sexual activity: Yes    Birth control/protection: Surgical    Comment: hyst  Other Topics Concern   Not on file  Social History Narrative   Not on file   Social Drivers of Health  Financial Resource Strain: Medium Risk (01/30/2021)   Overall Financial Resource Strain (CARDIA)    Difficulty of Paying Living Expenses: Somewhat hard  Food Insecurity: No Food Insecurity (08/15/2022)   Hunger Vital Sign    Worried About Running Out of Food in [place] Last Year: Never true    Ran Out of Food in [place] Last Year: Never true  Transportation Needs: No Transportation Needs (08/15/2022)   PRAPARE - Administrator, Civil Service (Medical): No    Lack of Transportation (Non-Medical): No  Physical Activity: Insufficiently Active (01/30/2021)   Exercise Vital Sign    Days of Exercise per Week: 2 days    Minutes of Exercise per Session: 10 min  Stress: No Stress Concern Present (01/30/2021)   [employer] of Occupational Health - Occupational Stress Questionnaire    Feeling of Stress : Only a little  Social Connections: Moderately Integrated (01/30/2021)   Social Connection and  Isolation Panel [NHANES]    Frequency of Communication with Friends and Family: Twice a week    Frequency of Social Gatherings with Friends and Family: Twice a week    Attends Religious Services: More than 4 times per year    Active Member of [employer] or Organizations: No    Attends Banker Meetings: Never    Marital Status: Married  Catering manager Violence: Not At Risk (08/15/2022)   Humiliation, Afraid, Rape, and Kick questionnaire    Fear of Current or Ex-Partner: No    Emotionally Abused: No    Physically Abused: No    Sexually Abused: No   Family History  Problem Relation Age of Onset   Cancer Father    Heart disease Brother    Alcohol abuse Brother    Depression Maternal Grandmother    Depression Grandchild    Breast cancer Daughter       VITAL SIGNS BP 119/75   Pulse 68   Temp (!) 97.5 F (36.4 C)   Resp 18   Ht 5' (1.524 m)   Wt 166 lb (75.3 kg)   SpO2 97%   BMI 32.42 kg/m   Outpatient Encounter Medications as of 02/12/2024  Medication Sig   acetaminophen (TYLENOL) 650 MG CR tablet Take 650 mg by mouth every 8 (eight) hours.   apixaban (ELIQUIS) 5 MG TABS tablet Take 1 tablet (5 mg total) by mouth 2 (two) times daily.   ARIPiprazole (ABILIFY) 5 MG tablet Take 5 mg by mouth daily.   busPIRone (BUSPAR) 10 MG tablet Take 10 mg by mouth 3 (three) times daily.   cyanocobalamin 1000 MCG tablet Take 1,000 mcg by mouth daily.   donepezil (ARICEPT) 10 MG tablet Take 10 mg by mouth at bedtime.   DULoxetine (CYMBALTA) 60 MG capsule Take 60 mg by mouth daily.   eszopiclone (LUNESTA) 1 MG TABS tablet Take 1 mg by mouth at bedtime. Take immediately before bedtime   fexofenadine (ALLEGRA) 180 MG tablet Take 180 mg by mouth daily.   furosemide (LASIX) 20 MG tablet Take 20 mg by mouth.   Magnesium Hydroxide (MILK OF MAGNESIA PO) Take 30 mLs by mouth as needed (constipation).   metoprolol tartrate (LOPRESSOR) 25 MG tablet Take 0.5 tablets (12.5 mg total) by mouth 2  (two) times daily.   modafinil (PROVIGIL) 200 MG tablet Take 0.5 tablets (100 mg total) by mouth daily.   rosuvastatin (CRESTOR) 5 MG tablet Take 5 mg by mouth at bedtime.   senna (SENOKOT) 8.6 MG TABS tablet Take 1  tablet (8.6 mg total) by mouth 2 (two) times daily.   SUMAtriptan (IMITREX) 100 MG tablet Take 100 mg by mouth every 2 (two) hours as needed for migraine or headache. May repeat in 2 hours if headache persists or recurs.   tetrabenazine (XENAZINE) 12.5 MG tablet Take 25 mg by mouth 2 (two) times daily.   UNABLE TO FIND Diet: Regular   Vitamins A & D (VITAMIN A & D) ointment Apply 1 Application topically 2 (two) times daily. To left heel   ZINC OXIDE EX Apply 1 Application topically See admin instructions. every shift and prn Special Instructions: Apply to sacrum, bilateral buttocks and coccyx q shift for redness.   No facility-administered encounter medications on file as of 02/12/2024.     SIGNIFICANT DIAGNOSTIC EXAMS  LABS REVIEWED PREVIOUS   11-14-22: hepatitis C nr 02-28-23: wbc 7.6; hgb 12.1; hct 375; mcv 100.5 plt 216   03-24-23: wbc 8.2; hgb 12.6; hct 39.5; mcv 101.1 plt 201; hgb A1c 5.4 chol 197; ldl 125; hdl 91; hdl 54  06-26-23: wbc 6.1; hgb 11.7; hct 36.2; mcv 98.4 plt 160; glucose 97; bun 14; creat 0.57; k+ 3.9; na++ 135; ca 8.7; gfr >60 d-dimer 0.28  CRP 3.0 10-16-23: chol 136; ldl 66; trig 70 hdl 56 11-06-23: glucose 154; bun 27; creat 0.80; k+ 3.6; na++ 139; ca 9.3 gfr >60   12-11-23: wbc 5.0; hgb 11.9; hct 36.7; mcv 99.5 plt 181; hgb A1c 5.3; tsh 2.295 01-12-24: vitamin B12: 225; folate 10.7; tsh 2.081; iron 77 tibc 334  NO NEW LABS.   Review of Systems  Constitutional:  Negative for malaise/fatigue.  Respiratory:  Negative for cough and shortness of breath.   Cardiovascular:  Negative for chest pain, palpitations and leg swelling.  Gastrointestinal:  Negative for abdominal pain, constipation and heartburn.  Musculoskeletal:  Negative for back pain, joint pain and  myalgias.  Skin: Negative.   Neurological:  Negative for dizziness.  Psychiatric/Behavioral:  [place] patient is not nervous/anxious.    Physical Exam Constitutional:      General: She is not in acute distress.    Appearance: She is well-developed and overweight. She is not diaphoretic.  Neck:     Thyroid: No thyromegaly.  Cardiovascular:     Rate and Rhythm: Normal rate and regular rhythm.     Pulses: Normal pulses.     Heart sounds: Normal heart sounds.  Pulmonary:     Effort: Pulmonary effort is normal. No respiratory distress.     Breath sounds: Normal breath sounds.  Abdominal:     General: Bowel sounds are normal. There is no distension.     Palpations: Abdomen is soft.     Tenderness: There is no abdominal tenderness.  Musculoskeletal:        General: Normal range of motion.     Cervical back: Neck supple.     Right lower leg: No edema.     Left lower leg: No edema.  Lymphadenopathy:     Cervical: No cervical adenopathy.  Skin:    General: Skin is warm and dry.  Neurological:     Mental Status: She is alert and oriented to person, place, and time.     Comments:  Has abnormal facial movements and hand tremors;        10-14-22: SLUMS 18/30   Psychiatric:        Mood and Affect: Mood normal.      ASSESSMENT/ PLAN:  TODAY  Vascular dementia without behavioral disturbance Protein  calorie malnutrition unspecified severity Weight loss unintentional  Will lower her aricept to 5 mg daily due to her weight loss and will monitor her status.    Synthia Innocent NP [facility]  call [phone]

## 2024-02-18 ENCOUNTER — Other Ambulatory Visit: Payer: Self-pay | Admitting: Adult Health

## 2024-02-18 MED ORDER — ESZOPICLONE 1 MG PO TABS: 1.0000 mg | ORAL_TABLET | Freq: Every day | ORAL | 0 refills | Status: DC

## 2024-02-25 DIAGNOSIS — I739 Peripheral vascular disease, unspecified: Secondary | ICD-10-CM | POA: Diagnosis not present

## 2024-02-25 DIAGNOSIS — L602 Onychogryphosis: Secondary | ICD-10-CM | POA: Diagnosis not present

## 2024-02-25 DIAGNOSIS — L603 Nail dystrophy: Secondary | ICD-10-CM | POA: Diagnosis not present

## 2024-03-01 DIAGNOSIS — F5105 Insomnia due to other mental disorder: Secondary | ICD-10-CM | POA: Diagnosis not present

## 2024-03-01 DIAGNOSIS — F411 Generalized anxiety disorder: Secondary | ICD-10-CM | POA: Diagnosis not present

## 2024-03-01 DIAGNOSIS — F332 Major depressive disorder, recurrent severe without psychotic features: Secondary | ICD-10-CM | POA: Diagnosis not present

## 2024-03-01 DIAGNOSIS — G2401 Drug induced subacute dyskinesia: Secondary | ICD-10-CM | POA: Diagnosis not present

## 2024-03-02 ENCOUNTER — Encounter: Payer: Self-pay | Admitting: Adult Health

## 2024-03-02 ENCOUNTER — Non-Acute Institutional Stay (SKILLED_NURSING_FACILITY): Payer: Self-pay | Admitting: Adult Health

## 2024-03-02 DIAGNOSIS — Z0000 Encounter for general adult medical examination without abnormal findings: Secondary | ICD-10-CM

## 2024-03-02 NOTE — Patient Instructions (Signed)
  [name] , Thank you for taking time to come for your Medicare Wellness Visit. I appreciate your ongoing commitment to your health goals. Please review the following plan we discussed and let me know if I can assist you in the future.   These are the goals we discussed:  Goals      Absence of Fall and Fall-Related Injury     Evidence-based guidance:  Assess fall risk using a validated tool when available. Consider balance and gait impairment, muscle weakness, diminished vision or hearing, environmental hazards, presence of urinary or bowel urgency and/or incontinence.  Communicate fall injury risk to interprofessional healthcare team.  Develop a fall prevention plan with the patient and family.  Promote use of personal vision and auditory aids.  Promote reorientation, appropriate sensory stimulation, and routines to decrease risk of fall when changes in mental status are present.  Assess assistance level required for safe and effective self-care; consider referral for home care.  Encourage physical activity, such as performance of self-care at highest level of ability, strength and balance exercise program, and provision of appropriate assistive devices; refer to rehabilitation therapy.  Refer to community-based fall prevention program where available.  If fall occurs, determine the cause and revise fall injury prevention plan.  Regularly review medication contribution to fall risk; consider risk related to polypharmacy and age.  Refer to pharmacist for consultation when concerns about medications are revealed.  Balance adequate pain management with potential for oversedation.  Provide guidance related to environmental modifications.  Consider supplementation with Vitamin D .   Notes:      DIET - INCREASE WATER  INTAKE     General - Client will not be readmitted within 30 days (C-SNP)        This is a list of the screening recommended for you and due dates:  Health Maintenance  Topic  Date Due   DTaP/Tdap/Td vaccine (2 - Td or Tdap) 01/06/2024   Medicare Annual Wellness Visit  02/06/2024   Flu Shot  06/11/2024   Pneumonia Vaccine  Completed   DEXA scan (bone density measurement)  Completed   Hepatitis C Screening  Completed   Zoster (Shingles) Vaccine  Completed   HPV Vaccine  Aged Out   Meningitis B Vaccine  Aged Out   Colon Cancer Screening  Discontinued   COVID-19 Vaccine  Discontinued

## 2024-03-02 NOTE — Progress Notes (Signed)
 Subjective:   [name] is a 78 y.o. female who presents for Medicare Annual (Subsequent) preventive examination.  Visit Complete: In person  Patient Medicare AWV questionnaire was completed by the patient on 03-02-24; I have confirmed that all information answered by patient is correct and no changes since this date.  Cardiac Risk Factors include: advanced age (>77men, >8 women);obesity (BMI >30kg/m2);sedentary lifestyle     Objective:    Today's Vitals   03/02/24 1134  BP: (!) 97/58  Pulse: 92  Resp: 18  Temp: 97.7 F (36.5 C)  SpO2: 96%  Weight: 166 lb (75.3 kg)  Height: 5' (1.524 m)   Body mass index is 32.42 kg/m.     04/16/2023    8:19 AM 03/21/2023    4:57 PM 03/20/2023    9:58 AM 03/04/2023   10:38 AM 02/06/2023    2:22 PM 01/23/2023   12:12 PM 01/10/2023   10:14 AM  Advanced Directives  Does Patient Have a Medical Advance Directive? Yes Yes Yes Yes Yes Yes Yes  Type of Advance Directive Out of facility DNR (pink MOST or yellow form) Out of facility DNR (pink MOST or yellow form) Out of facility DNR (pink MOST or yellow form) Out of facility DNR (pink MOST or yellow form) Out of facility DNR (pink MOST or yellow form) Out of facility DNR (pink MOST or yellow form) Out of facility DNR (pink MOST or yellow form)  Does patient want to make changes to medical advance directive? No - Patient declined No - Patient declined No - Patient declined No - Patient declined No - Patient declined No - Patient declined   Pre-existing out of facility DNR order (yellow form or pink MOST form) Pink MOST form placed in chart (order not valid for inpatient use) Pink MOST form placed in chart (order not valid for inpatient use) Pink MOST form placed in chart (order not valid for inpatient use)  Pink MOST form placed in chart (order not valid for inpatient use) Pink MOST form placed in chart (order not valid for inpatient use) Pink MOST form placed in chart (order not valid for inpatient use)     Current Medications (verified) Outpatient Encounter Medications as of 03/02/2024  Medication Sig   acetaminophen  (TYLENOL ) 650 MG CR tablet Take 650 mg by mouth every 8 (eight) hours.   apixaban  (ELIQUIS ) 5 MG TABS tablet Take 1 tablet (5 mg total) by mouth 2 (two) times daily.   ARIPiprazole  (ABILIFY ) 5 MG tablet Take 5 mg by mouth daily.   busPIRone  (BUSPAR ) 10 MG tablet Take 10 mg by mouth 3 (three) times daily.   cyanocobalamin  1000 MCG tablet Take 1,000 mcg by mouth daily.   donepezil (ARICEPT) 5 MG tablet Take 5 mg by mouth at bedtime.   DULoxetine  (CYMBALTA ) 60 MG capsule Take 60 mg by mouth daily.   eszopiclone  (LUNESTA ) 1 MG TABS tablet Take 1 tablet (1 mg total) by mouth at bedtime. Take immediately before bedtime   fexofenadine (ALLEGRA) 180 MG tablet Take 180 mg by mouth daily.   furosemide  (LASIX ) 20 MG tablet Take 20 mg by mouth.   Magnesium  Hydroxide (MILK OF MAGNESIA PO) Take 30 mLs by mouth as needed (constipation).   metoprolol  tartrate (LOPRESSOR ) 25 MG tablet Take 0.5 tablets (12.5 mg total) by mouth 2 (two) times daily.   modafinil  (PROVIGIL ) 200 MG tablet Take 0.5 tablets (100 mg total) by mouth daily.   rosuvastatin (CRESTOR) 5 MG tablet Take 5 mg  by mouth at bedtime.   senna (SENOKOT) 8.6 MG TABS tablet Take 1 tablet (8.6 mg total) by mouth 2 (two) times daily.   SUMAtriptan  (IMITREX ) 100 MG tablet Take 100 mg by mouth every 2 (two) hours as needed for migraine or headache. May repeat in 2 hours if headache persists or recurs.   tetrabenazine (XENAZINE) 12.5 MG tablet Take 25 mg by mouth 2 (two) times daily.   UNABLE TO FIND Diet: Regular   Vitamins A & D (VITAMIN A & D) ointment Apply 1 Application topically 2 (two) times daily. To left heel   ZINC OXIDE EX Apply 1 Application topically See admin instructions. every shift and prn Special Instructions: Apply to sacrum, bilateral buttocks and coccyx q shift for redness.   No facility-administered encounter  medications on file as of 03/02/2024.    Allergies (verified) Ticlid [ticlopidine], Nsaids, Prednisone, Trazodone and nefazodone , Monistat [miconazole], Myrbetriq  [mirabegron  er], and Sulfa antibiotics   History: Past Medical History:  Diagnosis Date   Anemia    Arthritis    Asthma    Atrial fibrillation (HCC)    COVID-19 08/05/2022   Depression    Elevated C-reactive protein (CRP) 06/27/2023   H/O head injury    Hearing loss    Heart murmur    can be heard at times, Doctor said not to worry   History of kidney stones    History of stomach ulcers    Migraine    Pneumonia    Psittacosis    Stroke (HCC)    Weakness of neck 03/02/2013   Past Surgical History:  Procedure Laterality Date   ANKLE FRACTURE SURGERY Left    APPENDECTOMY     BREAST LUMPECTOMY     CESAREAN SECTION     x 3   I & D KNEE WITH POLY EXCHANGE Left 08/16/2022   Procedure: KNEE POLY EXCHANGE;  Surgeon: Micheline Ahr, MD;  Location: [facility] OR;  Service: Orthopedics;  Laterality: Left;   KNEE ARTHROSCOPY WITH PATELLAR TENDON REPAIR Left 08/16/2022   Procedure: IRRIGATION AND DEBRIDEMENT LEFT KNEE REVISION WITH PATELLAR TENDON REPAIR and poly exchange;  Surgeon: Micheline Ahr, MD;  Location: [facility] OR;  Service: Orthopedics;  Laterality: Left;   PARTIAL HYSTERECTOMY     ROTATOR CUFF REPAIR Right    TOTAL KNEE ARTHROPLASTY Left 07/22/2022   Procedure: TOTAL KNEE ARTHROPLASTY;  Surgeon: Murleen Arms, MD;  Location: [facility];  Service: Orthopedics;  Laterality: Left;   Family History  Problem Relation Age of Onset   Cancer Father    Heart disease Brother    Alcohol  abuse Brother    Depression Maternal Grandmother    Depression Grandchild    Breast cancer Daughter    Social History   Socioeconomic History   Marital status: Married    Spouse name: Not on file   Number of children: 3   Years of education: Not on file   Highest education level: Not on file  Occupational History   Occupation: retired   Tobacco Use   Smoking status: Never   Smokeless tobacco: Never  Vaping Use   Vaping status: Never Used  Substance and Sexual Activity   Alcohol  use: Not Currently    Comment: occ beer   Drug use: No   Sexual activity: Yes    Birth control/protection: Surgical    Comment: hyst  Other Topics Concern   Not on file  Social History Narrative   Not on file   Social Drivers  of Health   Financial Resource Strain: Medium Risk (01/30/2021)   Overall Financial Resource Strain (CARDIA)    Difficulty of Paying Living Expenses: Somewhat hard  Food Insecurity: No Food Insecurity (08/15/2022)   Hunger Vital Sign    Worried About Running Out of Food in the Last Year: Never true    Ran Out of Food in the Last Year: Never true  Transportation Needs: No Transportation Needs (08/15/2022)   PRAPARE - Administrator, Civil Service (Medical): No    Lack of Transportation (Non-Medical): No  Physical Activity: Insufficiently Active (01/30/2021)   Exercise Vital Sign    Days of Exercise per Week: 2 days    Minutes of Exercise per Session: 10 min  Stress: No Stress Concern Present (01/30/2021)   [employer] of Occupational Health - Occupational Stress Questionnaire    Feeling of Stress : Only a little  Social Connections: Moderately Integrated (01/30/2021)   Social Connection and Isolation Panel [NHANES]    Frequency of Communication with Friends and Family: Twice a week    Frequency of Social Gatherings with Friends and Family: Twice a week    Attends Religious Services: More than 4 times per year    Active Member of [employer] or Organizations: No    Attends Engineer, structural: Never    Marital Status: Married    Tobacco Counseling Counseling given: Not Answered   Clinical Intake:  Pre-visit preparation completed: Yes  Pain : No/denies pain     BMI - recorded: 32.42 Nutritional Status: BMI > 30  Obese Nutritional Risks: Unintentional weight gain Diabetes: No      Interpreter Needed?: No  Comments: long term resident of SNF   Activities of Daily Living    03/02/2024   11:38 AM  In your present state of health, do you have any difficulty performing the following activities:  Hearing? 0  Vision? 0  Difficulty concentrating or making decisions? 1  Walking or climbing stairs? 1  Dressing or bathing? 1  Doing errands, shopping? 1  Preparing Food and eating ? Y  Using the Toilet? Y  In the past six months, have you accidently leaked urine? Y  Do you have problems with loss of bowel control? Y  Managing your Medications? Y  Managing your Finances? Y  Housekeeping or managing your Housekeeping? Y    Patient Care Team: Marilyne Shu, NP as PCP - General (Geriatric Medicine) Lasalle Pointer, MD as PCP - Cardiology (Cardiology) Lenton Rail, MD as Consulting Physician (Otolaryngology)  Indicate any recent Medical Services you may have received from other than [facility] providers in the past year (date may be approximate).     Assessment:   This is a routine wellness examination for [name].  Hearing/Vision screen No results found.   Goals Addressed             This Visit's Progress    Absence of Fall and Fall-Related Injury   On track    Evidence-based guidance:  Assess fall risk using a validated tool when available. Consider balance and gait impairment, muscle weakness, diminished vision or hearing, environmental hazards, presence of urinary or bowel urgency and/or incontinence.  Communicate fall injury risk to interprofessional healthcare team.  Develop a fall prevention plan with the patient and family.  Promote use of personal vision and auditory aids.  Promote reorientation, appropriate sensory stimulation, and routines to decrease risk of fall when changes in mental status are present.  Assess assistance level  required for safe and effective self-care; consider referral for home care.  Encourage physical activity, such  as performance of self-care at highest level of ability, strength and balance exercise program, and provision of appropriate assistive devices; refer to rehabilitation therapy.  Refer to community-based fall prevention program where available.  If fall occurs, determine the cause and revise fall injury prevention plan.  Regularly review medication contribution to fall risk; consider risk related to polypharmacy and age.  Refer to pharmacist for consultation when concerns about medications are revealed.  Balance adequate pain management with potential for oversedation.  Provide guidance related to environmental modifications.  Consider supplementation with Vitamin D .   Notes:      DIET - INCREASE WATER  INTAKE   On track    General - Client will not be readmitted within 30 days ([facility])   On track      Depression Screen    03/02/2024   11:40 AM 11/18/2023   11:17 AM 02/06/2023    3:20 PM 02/06/2023    2:19 PM 12/12/2022    1:57 PM 11/12/2022    1:32 PM 11/12/2022    1:31 PM  PHQ 2/9 Scores  PHQ - 2 Score 0 2 0 0 0 1 0  PHQ- 9 Score  3 1 0 0 3     Fall Risk    03/02/2024   11:39 AM 11/18/2023   11:17 AM 02/06/2023    3:20 PM 02/06/2023    2:20 PM 12/13/2022    8:49 AM  Fall Risk   Falls in the past year? 0 0 1 0 1  Number falls in past yr: 0 0 1 0 0  Injury with Fall? 0 0 1 0 1  Risk for fall due to : Impaired balance/gait;Impaired mobility Impaired balance/gait;Impaired mobility;History of fall(s) History of fall(s);Impaired balance/gait;Impaired mobility History of fall(s);Impaired balance/gait;Impaired mobility History of fall(s);Impaired balance/gait;Impaired mobility  Follow up   Falls evaluation completed Falls evaluation completed Falls evaluation completed    MEDICARE RISK AT HOME: Medicare Risk at Home Any stairs in or around the home?: Yes If so, are there any without handrails?: No Home free of loose throw rugs in walkways, pet beds, electrical cords, etc?: Yes Adequate  lighting in your home to reduce risk of falls?: Yes Life alert?: No Use of a cane, walker or w/c?: Yes Grab bars in the bathroom?: Yes Shower chair or bench in shower?: Yes Elevated toilet seat or a handicapped toilet?: Yes  TIMED UP AND GO:  Was the test performed?  No    Cognitive Function:    03/02/2024   11:40 AM 02/06/2023    3:21 PM 04/03/2022   11:29 AM  MMSE - Mini Mental State Exam  Not completed:  Unable to complete   Orientation to time 5    Orientation to Place 5    Registration 3    Attention/ Calculation 2    Recall 2    Language- name 2 objects 2    Language- repeat 1    Language- follow 3 step command 0    Language- read & follow direction 0    Write a sentence 0    Copy design 0    Total score 20       Information is confidential and restricted. Go to Review Flowsheets to unlock data.        03/02/2024   11:41 AM  6CIT Screen  What Year? 0 points  What month? 0 points  What time?  3 points  Count back from 20 4 points  Months in reverse 2 points  Repeat phrase 6 points  Total Score 15 points    Immunizations Immunization History  Administered Date(s) Administered   Fluad Quad(high Dose 65+) 09/01/2023   Influenza, High Dose Seasonal PF 08/19/2017   Influenza-Unspecified 08/14/2022   Moderna Covid-19 Vaccine Bivalent Booster 66yrs & up 09/28/2021   PFIZER(Purple Top)SARS-COV-2 Vaccination 12/06/2019, 12/27/2019, 08/17/2020   Pneumococcal Conjugate-13 08/19/2017   Pneumococcal Polysaccharide-23 09/22/2015   Tdap 01/05/2014   Zoster Recombinant(Shingrix) 07/23/2019, 09/24/2019    TDAP status: Up to date  Flu Vaccine status: Up to date  Pneumococcal vaccine status: Up to date  Covid-19 vaccine status: Completed vaccines  Qualifies for Shingles Vaccine? No   Zostavax completed     Screening Tests Health Maintenance  Topic Date Due   DTaP/Tdap/Td (2 - Td or Tdap) 01/06/2024   Medicare Annual Wellness (AWV)  02/06/2024   INFLUENZA  VACCINE  06/11/2024   Pneumonia Vaccine 80+ Years old  Completed   DEXA SCAN  Completed   Hepatitis C Screening  Completed   Zoster Vaccines- Shingrix  Completed   HPV VACCINES  Aged Out   Meningococcal B Vaccine  Aged Out   Colonoscopy  Discontinued   COVID-19 Vaccine  Discontinued    Health Maintenance  Health Maintenance Due  Topic Date Due   DTaP/Tdap/Td (2 - Td or Tdap) 01/06/2024   Medicare Annual Wellness (AWV)  02/06/2024    Colorectal cancer screening: No longer required.   Mammogram status: No longer required due to  .  Bone Density status: Completed 2023. Results reflect: Bone density results: OSTEOPOROSIS. Repeat every   years.  Lung Cancer Screening: (Low Dose CT Chest recommended if Age 67-80 years, 20 pack-year currently smoking OR have quit w/in 15years.) does not qualify.   Lung Cancer Screening Referral:   Additional Screening:  Hepatitis C Screening: does not qualify; Completed   Vision Screening: Recommended annual ophthalmology exams for early detection of glaucoma and other disorders of the eye. Is the patient up to date with their annual eye exam?  No  Who is the provider or what is the name of the office in which the patient attends annual eye exams?  If pt is not established with a provider, would they like to be referred to a provider to establish care? No .   Dental Screening: Recommended annual dental exams for proper oral hygiene  Diabetic Foot Exam:   Community Resource Referral / Chronic Care Management: CRR required this visit?  No   CCM required this visit?  No     Plan:     I have personally reviewed and noted the following in the patient's chart:   Medical and social history Use of alcohol , tobacco or illicit drugs  Current medications and supplements including opioid prescriptions. Patient is currently taking opioid prescriptions. Information provided to patient regarding non-opioid alternatives. Patient advised to discuss  non-opioid treatment plan with their provider. Functional ability and status Nutritional status Physical activity Advanced directives List of other physicians Hospitalizations, surgeries, and ER visits in previous 12 months Vitals Screenings to include cognitive, depression, and falls Referrals and appointments  In addition, I have reviewed and discussed with patient certain preventive protocols, quality metrics, and best practice recommendations. A written personalized care plan for preventive services as well as general preventive health recommendations were provided to patient.     Marilyne Shu, NP   03/02/2024   After Visit  Summary: (In Person-Printed) AVS printed and given to the patient  Nurse Notes: this exam was done by myself at this facility

## 2024-03-05 ENCOUNTER — Other Ambulatory Visit: Payer: Self-pay | Admitting: Adult Health

## 2024-03-05 MED ORDER — MODAFINIL 200 MG PO TABS: 100.0000 mg | ORAL_TABLET | Freq: Every day | ORAL | 0 refills | Status: DC

## 2024-03-17 ENCOUNTER — Encounter: Payer: Self-pay | Admitting: Internal Medicine

## 2024-03-17 ENCOUNTER — Non-Acute Institutional Stay (SKILLED_NURSING_FACILITY): Payer: Self-pay | Admitting: Internal Medicine

## 2024-03-17 DIAGNOSIS — I480 Paroxysmal atrial fibrillation: Secondary | ICD-10-CM

## 2024-03-17 DIAGNOSIS — E538 Deficiency of other specified B group vitamins: Secondary | ICD-10-CM | POA: Diagnosis not present

## 2024-03-17 DIAGNOSIS — M19042 Primary osteoarthritis, left hand: Secondary | ICD-10-CM | POA: Diagnosis not present

## 2024-03-17 DIAGNOSIS — F323 Major depressive disorder, single episode, severe with psychotic features: Secondary | ICD-10-CM | POA: Diagnosis not present

## 2024-03-17 DIAGNOSIS — E43 Unspecified severe protein-calorie malnutrition: Secondary | ICD-10-CM

## 2024-03-17 DIAGNOSIS — M19041 Primary osteoarthritis, right hand: Secondary | ICD-10-CM

## 2024-03-17 DIAGNOSIS — M19049 Primary osteoarthritis, unspecified hand: Secondary | ICD-10-CM | POA: Insufficient documentation

## 2024-03-17 DIAGNOSIS — D649 Anemia, unspecified: Secondary | ICD-10-CM

## 2024-03-17 NOTE — Assessment & Plan Note (Addendum)
 Clinically rhythm is regular today.  She remains on prophylaxis. TSH is therapeutic.

## 2024-03-17 NOTE — Progress Notes (Unsigned)
 NURSING HOME LOCATION:  [facility] ROOM NUMBER:  [id number] W  CODE STATUS:  Full Code  PCP: Marilyne Shu, NP   This is a nursing facility follow up visit of chronic medical diagnoses to document compliance with Regulation 483.30 (c) in The Long Term Care Survey Manual Phase 2 which mandates caregiver visit ( visits can alternate among physician, PA or NP as per statutes) within 10 days of 30 days / 60 days/ 90 days post admission to SNF date  .  Interim medical record and care since last SNF visit was updated with review of diagnostic studies and change in clinical status since last visit were documented.  HPI: She is a permanent resident of this facility with medical diagnoses of history of PAF, history of asthma, atrial fibrillation, chronic depression,migraine headache,history of ulcers, & history of stroke.  Serially anemia is stable to improved with current H/H 11.9/36.7. Iron panel is normal ; but B12 is low normal @ 225.Isolated hyperglycemia has been documented up to 154; but A1c was non diabetic @ 5.3%. TSH is therapeutic @ 2.081.  Review of systems: When asked how she was doing her response was "fair."  Her major complaint is arthritis in her hands described as an aching discomfort.  She also has pain in the legs for which she takes Tylenol  arthritis with benefit.  Has not been of help for her hand discomfort.  She also describes profound weakness which limits transfers from the bed to the wheelchair.  She questions whether the Ensure might be of benefit in addressing this.  She does describe cold intolerance with Raynaud's phenomena.  Constitutional: No fever, significant weight change, fatigue  Eyes: No redness, discharge, pain, vision change ENT/mouth: No nasal congestion,  purulent discharge, earache, change in hearing, sore throat  Cardiovascular: No chest pain, palpitations, paroxysmal nocturnal dyspnea, claudication, edema  Respiratory: No cough, sputum  production, hemoptysis, DOE, significant snoring, apnea   Gastrointestinal: No heartburn, dysphagia, abdominal pain, nausea /vomiting, rectal bleeding, melena, change in bowels Genitourinary: No dysuria, hematuria, pyuria, incontinence, nocturia Musculoskeletal: No joint stiffness, joint swelling, weakness, pain Dermatologic: No rash, pruritus, change in appearance of skin Neurologic: No dizziness, headache, syncope, seizures, numbness, tingling Psychiatric: No significant anxiety, depression, insomnia, anorexia Endocrine: No change in hair/skin/nails, excessive thirst, excessive hunger, excessive urination  Hematologic/lymphatic: No significant bruising, lymphadenopathy, abnormal bleeding Allergy/immunology: No itchy/watery eyes, significant sneezing, urticaria, angioedema  Physical exam:  Pertinent or positive findings: She is wheelchair-bound.  There is marked masking of the facies.  No striking finding is coarse tremor of the hands and also of the mandible.  Eyebrows are decreased in density laterally.  Dental hygiene is immaculate.  There is a grade 1/2 systolic murmur at the right base with increase in S2.  There is intermittent splitting of S2.  Although she has a history of A-fib; clinically the rhythm is regular today.  Abdomen is protuberant.  Pedal pulses are decreased.  She has trace-1/2+ edema at the sock line.  The right foot is inverted.  Marked interosseous wasting of the hands is noted.  There is some lateral deviation of the fingers.  She has isolated DIP arthritic changes.  The right lower extremity is stronger opposition than the left.[name]  Upper extremities are weaker than the lower extremities. General appearance: Adequately nourished; no acute distress, increased work of breathing is present.   Lymphatic: No lymphadenopathy about the head, neck, axilla. Eyes: No conjunctival inflammation or lid edema is present. There  is no scleral icterus. Ears:  External ear exam shows no  significant lesions or deformities.   Nose:  External nasal examination shows no deformity or inflammation. Nasal mucosa are pink and moist without lesions, exudates Oral exam:  Lips and gums are healthy appearing. There is no oropharyngeal erythema or exudate. Neck:  No thyromegaly, masses, tenderness noted.    Heart:  Normal rate and regular rhythm. S1 and S2 normal without gallop, murmur, click, rub .  Lungs: Chest clear to auscultation without wheezes, rhonchi, rales, rubs. Abdomen: Bowel sounds are normal. Abdomen is soft and nontender with no organomegaly, hernias, masses. GU: Deferred  Extremities:  No cyanosis, clubbing, edema  Neurologic exam : Cn 2-7 intact Strength equal  in upper & lower extremities Balance, Rhomberg, finger to nose testing could not be completed due to clinical state Deep tendon reflexes are equal Skin: Warm & dry w/o tenting. No significant lesions or rash.  See summary under each active problem in the Problem List with associated updated therapeutic plan

## 2024-03-17 NOTE — Assessment & Plan Note (Addendum)
 H/H is stable to improved.  Continue to monitor.

## 2024-03-17 NOTE — Patient Instructions (Signed)
 See assessment and plan under each diagnosis in the problem list and acutely for this visit:  Vitamin B 12 deficiency Update B12 level after 8-12 weeks of 1000 mcg supplementation.  Normochromic normocytic anemia H/H is stable to improved.  Continue to monitor.  PAF (paroxysmal atrial fibrillation) (HCC) Clinically rhythm is regular today.  She remains on prophylaxis. TSH is therapeutic.  Major depression with psychotic features (HCC) Monitor for increased risk of Serotonin Syndrome because of current polypharmacy.  Protein calorie malnutrition (HCC) She describes profound weakness which limits transfers from bed to wheelchair.  She questions whether Ensure would be of benefit.  The most recent albumin on record was 4.1 and total protein 7.0 on 07/18/2022.  On exam she does exhibit marked interosseous wasting.  Strength to opposition is extremely poor in all extremities.  The right lower extremity is stronger than the other extremities but still abnormal. Albumin and total protein will be updated.  Degenerative joint disease of hand She describes pain in both the upper and lower extremities, more so in the hands.  On exam she has DIP osteoarthritic changes and some lateral deviation of the fingers.  Sed rate and RA will be assessed.

## 2024-03-17 NOTE — Assessment & Plan Note (Signed)
 She describes profound weakness which limits transfers from bed to wheelchair.  She questions whether Ensure would be of benefit.  The most recent albumin on record was 4.1 and total protein 7.0 on 07/18/2022.  On exam she does exhibit marked interosseous wasting.  Strength to opposition is extremely poor in all extremities.  The right lower extremity is stronger than the other extremities but still abnormal. Albumin and total protein will be updated.

## 2024-03-17 NOTE — Assessment & Plan Note (Signed)
 Monitor for increased risk of Serotonin Syndrome because of current polypharmacy.

## 2024-03-17 NOTE — Assessment & Plan Note (Signed)
 She describes pain in both the upper and lower extremities, more so in the hands.  On exam she has DIP osteoarthritic changes and some lateral deviation of the fingers.  Sed rate and already will be assessed.

## 2024-03-17 NOTE — Assessment & Plan Note (Signed)
 Update B12 level after 8-12 weeks of 1000 mcg supplementation.

## 2024-03-18 ENCOUNTER — Other Ambulatory Visit (HOSPITAL_COMMUNITY)
Admission: RE | Admit: 2024-03-18 | Discharge: 2024-03-18 | Disposition: A | Discharge status: Home / Self Care | Source: Skilled Nursing Facility | Attending: Adult Health | Admitting: Adult Health

## 2024-03-18 DIAGNOSIS — D649 Anemia, unspecified: Secondary | ICD-10-CM | POA: Diagnosis not present

## 2024-03-18 DIAGNOSIS — D510 Vitamin B12 deficiency anemia due to intrinsic factor deficiency: Secondary | ICD-10-CM | POA: Diagnosis not present

## 2024-03-18 LAB — COMPREHENSIVE METABOLIC PANEL WITH GFR
ALT: 11 U/L (ref 0–44)
AST: 20 U/L (ref 15–41)
Albumin: 3.6 g/dL (ref 3.5–5.0)
Alkaline Phosphatase: 46 U/L (ref 38–126)
Anion gap: 8 (ref 5–15)
BUN: 23 mg/dL (ref 8–23)
CO2: 29 mmol/L (ref 22–32)
Calcium: 9.3 mg/dL (ref 8.9–10.3)
Chloride: 101 mmol/L (ref 98–111)
Creatinine, Ser: 0.67 mg/dL (ref 0.44–1.00)
GFR, Estimated: >60 mL/min (ref >60)
Glucose, Bld: 89 mg/dL (ref 70–99)
Potassium: 3.8 mmol/L (ref 3.5–5.1)
Sodium: 138 mmol/L (ref 135–145)
Total Bilirubin: 0.8 mg/dL (ref 0.0–1.2)
Total Protein: 6.5 g/dL (ref 6.5–8.1)

## 2024-03-18 LAB — VITAMIN B12: Vitamin B-12: 1054 pg/mL — ABNORMAL HIGH (ref 180–914)

## 2024-03-18 LAB — SEDIMENTATION RATE: Sed Rate: 10 mm/h (ref 0–22)

## 2024-03-19 LAB — ANA W/REFLEX IF POSITIVE: Anti Nuclear Antibody (ANA): NEGATIVE

## 2024-03-19 LAB — RHEUMATOID FACTOR: Rheumatoid fact SerPl-aCnc: <10 [IU]/mL (ref <14.0)

## 2024-03-22 ENCOUNTER — Other Ambulatory Visit: Payer: Self-pay | Admitting: Adult Health

## 2024-03-22 MED ORDER — ESZOPICLONE 1 MG PO TABS: 1.0000 mg | ORAL_TABLET | Freq: Every day | ORAL | 0 refills | Status: DC

## 2024-04-01 DIAGNOSIS — G2401 Drug induced subacute dyskinesia: Secondary | ICD-10-CM | POA: Diagnosis not present

## 2024-04-01 DIAGNOSIS — F5105 Insomnia due to other mental disorder: Secondary | ICD-10-CM | POA: Diagnosis not present

## 2024-04-01 DIAGNOSIS — F332 Major depressive disorder, recurrent severe without psychotic features: Secondary | ICD-10-CM | POA: Diagnosis not present

## 2024-04-01 DIAGNOSIS — F411 Generalized anxiety disorder: Secondary | ICD-10-CM | POA: Diagnosis not present

## 2024-04-13 ENCOUNTER — Non-Acute Institutional Stay (SKILLED_NURSING_FACILITY): Payer: Self-pay | Admitting: Adult Health

## 2024-04-13 ENCOUNTER — Encounter: Payer: Self-pay | Admitting: Adult Health

## 2024-04-13 DIAGNOSIS — F0150 Vascular dementia without behavioral disturbance: Secondary | ICD-10-CM

## 2024-04-13 DIAGNOSIS — R29818 Other symptoms and signs involving the nervous system: Secondary | ICD-10-CM | POA: Diagnosis not present

## 2024-04-13 DIAGNOSIS — R600 Localized edema: Secondary | ICD-10-CM

## 2024-04-13 DIAGNOSIS — R4189 Other symptoms and signs involving cognitive functions and awareness: Secondary | ICD-10-CM

## 2024-04-13 DIAGNOSIS — G894 Chronic pain syndrome: Secondary | ICD-10-CM

## 2024-04-13 NOTE — Progress Notes (Signed)
 Location:  [facility] Room Number: 101 Place of Service:  SNF (31)   CODE STATUS: full   Allergies  Allergen Reactions   Ticlid [Ticlopidine] Shortness Of Breath   Nsaids Other (See Comments)    Bleeding ulcer   Prednisone Other (See Comments)    Makes her stomach hurt--knows this isn't an allergy but doesn't want to take it    Trazodone And Nefazodone      hjallucinations   Monistat [Miconazole] Swelling and Rash   Myrbetriq  [Mirabegron  Er] Rash    See 02/27/2023   Sulfa Antibiotics Rash    Chief Complaint  Patient presents with   Medical Management of Chronic Issues            Vascular dementia without behavioral disturbance/neurocognitive deficit:  Chronic pain syndrome:  Bilateral lower extremity edema    HPI:  She is a 78 y.o. long term resident of this facility being seen for the management of her chronic illnesses:Vascular dementia without behavioral disturbance/neurocognitive deficit:  Chronic pain syndrome:  Bilateral lower extremity edema. Her TD movements are less intense. There are no reports of uncontrolled pain. There are no reports of anxiety or depressive thoughts.    Past Medical History:  Diagnosis Date   Anemia    Arthritis    Asthma    Atrial fibrillation (HCC)    COVID-19 08/05/2022   Depression    Elevated C-reactive protein (CRP) 06/27/2023   H/O head injury    Hearing loss    Heart murmur    can be heard at times, Doctor said not to worry   History of kidney stones    History of stomach ulcers    Migraine    Pneumonia    Psittacosis    Stroke (HCC)    Weakness of neck 03/02/2013    Past Surgical History:  Procedure Laterality Date   ANKLE FRACTURE SURGERY Left    APPENDECTOMY     BREAST LUMPECTOMY     CESAREAN SECTION     x 3   I & D KNEE WITH POLY EXCHANGE Left 08/16/2022   Procedure: KNEE POLY EXCHANGE;  Surgeon: Micheline Ahr, MD;  Location: [facility] OR;  Service: Orthopedics;  Laterality: Left;   KNEE  ARTHROSCOPY WITH PATELLAR TENDON REPAIR Left 08/16/2022   Procedure: IRRIGATION AND DEBRIDEMENT LEFT KNEE REVISION WITH PATELLAR TENDON REPAIR and poly exchange;  Surgeon: Micheline Ahr, MD;  Location: [facility] OR;  Service: Orthopedics;  Laterality: Left;   PARTIAL HYSTERECTOMY     ROTATOR CUFF REPAIR Right    TOTAL KNEE ARTHROPLASTY Left 07/22/2022   Procedure: TOTAL KNEE ARTHROPLASTY;  Surgeon: Murleen Arms, MD;  Location: [facility];  Service: Orthopedics;  Laterality: Left;    Social History   Socioeconomic History   Marital status: Married    Spouse name: Not on file   Number of children: 3   Years of education: Not on file   Highest education level: Not on file  Occupational History   Occupation: retired  Tobacco Use   Smoking status: Never   Smokeless tobacco: Never  Vaping Use   Vaping status: Never Used  Substance and Sexual Activity   Alcohol  use: Not Currently    Comment: occ beer   Drug use: No   Sexual activity: Yes    Birth control/protection: Surgical    Comment: hyst  Other Topics Concern   Not on file  Social History Narrative   Not on file   Social  Drivers of Health   Financial Resource Strain: Medium Risk (01/30/2021)   Overall Financial Resource Strain (CARDIA)    Difficulty of Paying Living Expenses: Somewhat hard  Food Insecurity: No Food Insecurity (08/15/2022)   Hunger Vital Sign    Worried About Running Out of Food in the Last Year: Never true    Ran Out of Food in the Last Year: Never true  Transportation Needs: No Transportation Needs (08/15/2022)   PRAPARE - Administrator, Civil Service (Medical): No    Lack of Transportation (Non-Medical): No  Physical Activity: Insufficiently Active (01/30/2021)   Exercise Vital Sign    Days of Exercise per Week: 2 days    Minutes of Exercise per Session: 10 min  Stress: No Stress Concern Present (01/30/2021)   [employer] of Occupational Health - Occupational Stress Questionnaire     Feeling of Stress : Only a little  Social Connections: Moderately Integrated (01/30/2021)   Social Connection and Isolation Panel [NHANES]    Frequency of Communication with Friends and Family: Twice a week    Frequency of Social Gatherings with Friends and Family: Twice a week    Attends Religious Services: More than 4 times per year    Active Member of [employer] or Organizations: No    Attends Banker Meetings: Never    Marital Status: Married  Catering manager Violence: Not At Risk (08/15/2022)   Humiliation, Afraid, Rape, and Kick questionnaire    Fear of Current or Ex-Partner: No    Emotionally Abused: No    Physically Abused: No    Sexually Abused: No   Family History  Problem Relation Age of Onset   Cancer Father    Heart disease Brother    Alcohol  abuse Brother    Depression Maternal Grandmother    Depression Grandchild    Breast cancer Daughter       VITAL SIGNS BP (!) 147/62   Pulse 80   Temp 98.6 F (37 C)   Resp 20   Ht 5' (1.524 m)   Wt 168 lb (76.2 kg)   SpO2 99%   BMI 32.81 kg/m   Outpatient Encounter Medications as of 04/13/2024  Medication Sig   acetaminophen  (TYLENOL ) 650 MG CR tablet Take 650 mg by mouth every 8 (eight) hours.   apixaban  (ELIQUIS ) 5 MG TABS tablet Take 1 tablet (5 mg total) by mouth 2 (two) times daily.   ARIPiprazole  (ABILIFY ) 5 MG tablet Take 5 mg by mouth daily.   busPIRone  (BUSPAR ) 10 MG tablet Take 10 mg by mouth 3 (three) times daily.   cyanocobalamin  1000 MCG tablet Take 1,000 mcg by mouth daily.   donepezil (ARICEPT) 5 MG tablet Take 5 mg by mouth at bedtime.   DULoxetine  (CYMBALTA ) 60 MG capsule Take 60 mg by mouth daily.   eszopiclone  (LUNESTA ) 1 MG TABS tablet Take 1 tablet (1 mg total) by mouth at bedtime. Take immediately before bedtime   furosemide  (LASIX ) 20 MG tablet Take 20 mg by mouth.   Magnesium  Hydroxide (MILK OF MAGNESIA PO) Take 30 mLs by mouth as needed (constipation).   metoprolol  tartrate  (LOPRESSOR ) 25 MG tablet Take 0.5 tablets (12.5 mg total) by mouth 2 (two) times daily.   modafinil  (PROVIGIL ) 200 MG tablet Take 0.5 tablets (100 mg total) by mouth daily.   rosuvastatin (CRESTOR) 5 MG tablet Take 5 mg by mouth at bedtime.   senna (SENOKOT) 8.6 MG TABS tablet Take 1 tablet (8.6 mg total) by  mouth 2 (two) times daily.   SUMAtriptan  (IMITREX ) 100 MG tablet Take 100 mg by mouth every 2 (two) hours as needed for migraine or headache. May repeat in 2 hours if headache persists or recurs.   tetrabenazine (XENAZINE) 12.5 MG tablet Take 25 mg by mouth 2 (two) times daily.   UNABLE TO FIND Diet: Regular   Vitamins A & D (VITAMIN A & D) ointment Apply 1 Application topically 2 (two) times daily. To left heel   ZINC OXIDE EX Apply 1 Application topically See admin instructions. every shift and prn Special Instructions: Apply to sacrum, bilateral buttocks and coccyx q shift for redness.   [DISCONTINUED] fexofenadine (ALLEGRA) 180 MG tablet Take 180 mg by mouth daily.   No facility-administered encounter medications on file as of 04/13/2024.     SIGNIFICANT DIAGNOSTIC EXAMS  LABS REVIEWED PREVIOUS   06-26-23: wbc 6.1; hgb 11.7; hct 36.2; mcv 98.4 plt 160; glucose 97; bun 14; creat 0.57; k+ 3.9; na++ 135; ca 8.7; gfr >60 d-dimer 0.28  CRP 3.0 10-16-23: chol 136; ldl 66; trig 70 hdl 56 11-06-23: glucose 154; bun 27; creat 0.80; k+ 3.6; na++ 139; ca 9.3 gfr >60   12-11-23: wbc 5.0; hgb 11.9; hct 36.7; mcv 99.5 plt 181; hgb A1c 5.3; tsh 2.295 01-12-24: vitamin B12: 225; folate 10.7; tsh 2.081; iron 77 tibc 334  TODAY  03-18-24: glucose 89; bun 23 creat 0.67; k+ 3.8; na++ 138; ca 9.3 gfr >60; protein 6.5 albumin 3.6; sed rate 10; vitamin B12: 1054; RA factor: <10; ANA neg    Review of Systems  Constitutional:  Negative for malaise/fatigue.  Respiratory:  Negative for cough and shortness of breath.   Cardiovascular:  Negative for chest pain, palpitations and leg swelling.  Gastrointestinal:   Negative for abdominal pain, constipation and heartburn.  Musculoskeletal:  Negative for back pain, joint pain and myalgias.  Skin: Negative.   Neurological:  Negative for dizziness.  Psychiatric/Behavioral:  The patient is not nervous/anxious.    Physical Exam Constitutional:      General: She is not in acute distress.    Appearance: She is well-developed. She is obese. She is not diaphoretic.  Neck:     Thyroid : No thyromegaly.  Cardiovascular:     Rate and Rhythm: Normal rate and regular rhythm.     Pulses: Normal pulses.     Heart sounds: Normal heart sounds.  Pulmonary:     Effort: Pulmonary effort is normal. No respiratory distress.     Breath sounds: Normal breath sounds.  Abdominal:     General: Bowel sounds are normal. There is no distension.     Palpations: Abdomen is soft.     Tenderness: There is no abdominal tenderness.  Musculoskeletal:     Cervical back: Neck supple.     Right lower leg: No edema.     Left lower leg: No edema.  Lymphadenopathy:     Cervical: No cervical adenopathy.  Skin:    General: Skin is warm and dry.  Neurological:     Mental Status: She is alert. Mental status is at baseline.     Comments: Has abnormal facial movements and hand tremors;        10-14-22: SLUMS 18/30  Psychiatric:        Mood and Affect: Mood normal.        ASSESSMENT/ PLAN:  TODAY  Vascular dementia without behavioral disturbance/neurocognitive deficit: weight is 168 pounds; is on aricept 5 mg nightly   2. Chronic  pain syndrome: is on cymbalta  60 mg daily and tylenol  650 mg nightly   3. Bilateral lower extremity edema: will continue lasix  20 mg daily   PREVIOUS   4. Chronic anemia: hgb 11.9; iron studies normal    5. Chronic insomnia: will continue lunesta  1 mg nightly   6. Psychosis in elderly/major depression with psychotic features: will continue abilify  5 mg daily cymbalta  60 mg daily buspar  20 mg twice daily   7. Somnolence: will continue provigil  100  mg daily   8. Protein calorie malnutrition: will continue supplements as directed  9. Vitamin B 12: deficiency: level is 1054; will continue  1,000 mcg daily   10. Urinary incontinence: unable to tolerate myrbetriq   11. Longstanding persistent atrial fibrillation/history of DVT: heart rate is stable will continue eliquis  5 mg twice daily and is taking lopressor  25 mg twice daily for rate control.   12. Tardive dyskinesia: does have less abnormal movements. Will continue tetrabenzine  25 mg twice daily; has failed ingrezza  and austeda.   13. Migraine without status migrainosus, non intractable unspecified; type: has prn imitrex   14. Uncomplicated asthma unspecified asthma severity unspecified, unspecified whether persistent: is presently off medications will monitor  15. Chronic constipation: will continue senna s twice daily     Britt Candle NP [facility]   call [phone]

## 2024-04-21 ENCOUNTER — Other Ambulatory Visit: Payer: Self-pay | Admitting: Adult Health

## 2024-04-21 MED ORDER — ESZOPICLONE 1 MG PO TABS: 1.0000 mg | ORAL_TABLET | ORAL | 0 refills | Status: DC

## 2024-04-30 ENCOUNTER — Encounter: Payer: Self-pay | Admitting: Adult Health

## 2024-04-30 ENCOUNTER — Non-Acute Institutional Stay (SKILLED_NURSING_FACILITY): Payer: Self-pay | Admitting: Adult Health

## 2024-04-30 DIAGNOSIS — I480 Paroxysmal atrial fibrillation: Secondary | ICD-10-CM

## 2024-04-30 DIAGNOSIS — E43 Unspecified severe protein-calorie malnutrition: Secondary | ICD-10-CM

## 2024-04-30 DIAGNOSIS — F0390 Unspecified dementia without behavioral disturbance: Secondary | ICD-10-CM

## 2024-04-30 NOTE — Progress Notes (Signed)
 Location:  [facility] Room Number: 101 Place of Service:  SNF (31)   CODE STATUS: full   Allergies  Allergen Reactions   Ticlid [Ticlopidine] Shortness Of Breath   Nsaids Other (See Comments)    Bleeding ulcer   Prednisone Other (See Comments)    Makes her stomach hurt--knows this isn't an allergy but doesn't want to take it    Trazodone And Nefazodone      hjallucinations   Monistat [Miconazole] Swelling and Rash   Myrbetriq  [Mirabegron  Er] Rash    See 02/27/2023   Sulfa Antibiotics Rash    Chief Complaint  Patient presents with   Acute Visit    Care plan meeting    HPI:  We have come together for her care plan meeting. BIMS 15/15 mood: 7/30: decreased energy; nervous; trouble concentrating. She is using a wheelchair without falls. She requires moderate to maximum assist with her adl care. She is frequently incontinent of bladder and bowel. Dietary; regular diet appetite 76-100%; feeds self weight is 164.4 pounds. Activities: does attend. Therapy: lower body dressing and transfers. She will continue to be followed for her chronic illnesses including: PAF (paroxsymal atrial fibrillation)  Severe protein calorie malnutrition  Psychosis in elderly   Past Medical History:  Diagnosis Date   Anemia    Arthritis    Asthma    Atrial fibrillation (HCC)    COVID-19 08/05/2022   Depression    Elevated C-reactive protein (CRP) 06/27/2023   H/O head injury    Hearing loss    Heart murmur    can be heard at times, Doctor said not to worry   History of kidney stones    History of stomach ulcers    Migraine    Pneumonia    Psittacosis    Stroke (HCC)    Weakness of neck 03/02/2013    Past Surgical History:  Procedure Laterality Date   ANKLE FRACTURE SURGERY Left    APPENDECTOMY     BREAST LUMPECTOMY     CESAREAN SECTION     x 3   I & D KNEE WITH POLY EXCHANGE Left 08/16/2022   Procedure: KNEE POLY EXCHANGE;  Surgeon: Micheline Ahr, MD;  Location:  [facility] OR;  Service: Orthopedics;  Laterality: Left;   KNEE ARTHROSCOPY WITH PATELLAR TENDON REPAIR Left 08/16/2022   Procedure: IRRIGATION AND DEBRIDEMENT LEFT KNEE REVISION WITH PATELLAR TENDON REPAIR and poly exchange;  Surgeon: Micheline Ahr, MD;  Location: [facility] OR;  Service: Orthopedics;  Laterality: Left;   PARTIAL HYSTERECTOMY     ROTATOR CUFF REPAIR Right    TOTAL KNEE ARTHROPLASTY Left 07/22/2022   Procedure: TOTAL KNEE ARTHROPLASTY;  Surgeon: Murleen Arms, MD;  Location: [facility];  Service: Orthopedics;  Laterality: Left;    Social History   Socioeconomic History   Marital status: Married    Spouse name: Not on file   Number of children: 3   Years of education: Not on file   Highest education level: Not on file  Occupational History   Occupation: retired  Tobacco Use   Smoking status: Never   Smokeless tobacco: Never  Vaping Use   Vaping status: Never Used  Substance and Sexual Activity   Alcohol  use: Not Currently    Comment: occ beer   Drug use: No   Sexual activity: Yes    Birth control/protection: Surgical    Comment: hyst  Other Topics Concern   Not on file  Social History Narrative  Not on file   Social Drivers of Health   Financial Resource Strain: Medium Risk (01/30/2021)   Overall Financial Resource Strain (CARDIA)    Difficulty of Paying Living Expenses: Somewhat hard  Food Insecurity: No Food Insecurity (08/15/2022)   Hunger Vital Sign    Worried About Running Out of Food in the Last Year: Never true    Ran Out of Food in the Last Year: Never true  Transportation Needs: No Transportation Needs (08/15/2022)   PRAPARE - Administrator, Civil Service (Medical): No    Lack of Transportation (Non-Medical): No  Physical Activity: Insufficiently Active (01/30/2021)   Exercise Vital Sign    Days of Exercise per Week: 2 days    Minutes of Exercise per Session: 10 min  Stress: No Stress Concern Present (01/30/2021)   [employer] of  Occupational Health - Occupational Stress Questionnaire    Feeling of Stress : Only a little  Social Connections: Moderately Integrated (01/30/2021)   Social Connection and Isolation Panel    Frequency of Communication with Friends and Family: Twice a week    Frequency of Social Gatherings with Friends and Family: Twice a week    Attends Religious Services: More than 4 times per year    Active Member of [employer] or Organizations: No    Attends Banker Meetings: Never    Marital Status: Married  Catering manager Violence: Not At Risk (08/15/2022)   Humiliation, Afraid, Rape, and Kick questionnaire    Fear of Current or Ex-Partner: No    Emotionally Abused: No    Physically Abused: No    Sexually Abused: No   Family History  Problem Relation Age of Onset   Cancer Father    Heart disease Brother    Alcohol  abuse Brother    Depression Maternal Grandmother    Depression Grandchild    Breast cancer Daughter       VITAL SIGNS BP (!) 101/54   Pulse 80   Temp (!) 97.3 F (36.3 C)   Resp 20   Ht 5' (1.524 m)   Wt 164 lb 6.4 oz (74.6 kg)   SpO2 95%   BMI 32.11 kg/m   Outpatient Encounter Medications as of 04/30/2024  Medication Sig   modafinil  (PROVIGIL ) 100 MG tablet Take 100 mg by mouth every other day.   acetaminophen  (TYLENOL ) 650 MG CR tablet Take 650 mg by mouth every 8 (eight) hours.   apixaban  (ELIQUIS ) 5 MG TABS tablet Take 1 tablet (5 mg total) by mouth 2 (two) times daily.   ARIPiprazole  (ABILIFY ) 5 MG tablet Take 5 mg by mouth daily.   busPIRone  (BUSPAR ) 10 MG tablet Take 10 mg by mouth 3 (three) times daily.   cyanocobalamin  1000 MCG tablet Take 1,000 mcg by mouth daily.   donepezil (ARICEPT) 5 MG tablet Take 5 mg by mouth at bedtime.   DULoxetine  (CYMBALTA ) 60 MG capsule Take 60 mg by mouth daily.   eszopiclone  (LUNESTA ) 1 MG TABS tablet Take 1 tablet (1 mg total) by mouth every other day. Take immediately before bedtime   furosemide  (LASIX ) 20 MG tablet  Take 20 mg by mouth.   Magnesium  Hydroxide (MILK OF MAGNESIA PO) Take 30 mLs by mouth as needed (constipation).   metoprolol  tartrate (LOPRESSOR ) 25 MG tablet Take 0.5 tablets (12.5 mg total) by mouth 2 (two) times daily.   rosuvastatin (CRESTOR) 5 MG tablet Take 5 mg by mouth at bedtime.   senna (SENOKOT) 8.6 MG TABS  tablet Take 1 tablet (8.6 mg total) by mouth 2 (two) times daily.   SUMAtriptan  (IMITREX ) 100 MG tablet Take 100 mg by mouth every 2 (two) hours as needed for migraine or headache. May repeat in 2 hours if headache persists or recurs.   tetrabenazine (XENAZINE) 12.5 MG tablet Take 25 mg by mouth 2 (two) times daily.   UNABLE TO FIND Diet: Regular   Vitamins A & D (VITAMIN A & D) ointment Apply 1 Application topically 2 (two) times daily. To left heel   ZINC OXIDE EX Apply 1 Application topically See admin instructions. every shift and prn Special Instructions: Apply to sacrum, bilateral buttocks and coccyx q shift for redness.   [DISCONTINUED] modafinil  (PROVIGIL ) 200 MG tablet Take 0.5 tablets (100 mg total) by mouth daily.   No facility-administered encounter medications on file as of 04/30/2024.     SIGNIFICANT DIAGNOSTIC EXAMS  LABS REVIEWED PREVIOUS   06-26-23: wbc 6.1; hgb 11.7; hct 36.2; mcv 98.4 plt 160; glucose 97; bun 14; creat 0.57; k+ 3.9; na++ 135; ca 8.7; gfr >60 d-dimer 0.28  CRP 3.0 10-16-23: chol 136; ldl 66; trig 70 hdl 56 11-06-23: glucose 154; bun 27; creat 0.80; k+ 3.6; na++ 139; ca 9.3 gfr >60   12-11-23: wbc 5.0; hgb 11.9; hct 36.7; mcv 99.5 plt 181; hgb A1c 5.3; tsh 2.295 01-12-24: vitamin B12: 225; folate 10.7; tsh 2.081; iron 77 tibc 334 03-18-24: glucose 89; bun 23 creat 0.67; k+ 3.8; na++ 138; ca 9.3 gfr >60; protein 6.5 albumin 3.6; sed rate 10; vitamin B12: 1054; RA factor: <10; ANA neg  NO NEW LABS.     Review of Systems  Constitutional:  Negative for malaise/fatigue.  Respiratory:  Negative for cough and shortness of breath.   Cardiovascular:   Negative for chest pain, palpitations and leg swelling.  Gastrointestinal:  Negative for abdominal pain, constipation and heartburn.  Musculoskeletal:  Negative for back pain, joint pain and myalgias.  Skin: Negative.   Neurological:  Negative for dizziness.  Psychiatric/Behavioral:  The patient is not nervous/anxious.    Physical Exam Constitutional:      General: She is not in acute distress.    Appearance: She is well-developed. She is obese. She is not diaphoretic.  Neck:     Thyroid : No thyromegaly.   Cardiovascular:     Rate and Rhythm: Normal rate and regular rhythm.     Pulses: Normal pulses.     Heart sounds: Normal heart sounds.  Pulmonary:     Effort: Pulmonary effort is normal. No respiratory distress.     Breath sounds: Normal breath sounds.  Abdominal:     General: Bowel sounds are normal. There is no distension.     Palpations: Abdomen is soft.     Tenderness: There is no abdominal tenderness.   Musculoskeletal:        General: Normal range of motion.     Cervical back: Neck supple.     Right lower leg: No edema.     Left lower leg: No edema.  Lymphadenopathy:     Cervical: No cervical adenopathy.   Skin:    General: Skin is warm and dry.   Neurological:     Mental Status: She is alert. Mental status is at baseline.     Comments:  Has fewer abnormal facial movements and tremors         10-14-22: SLUMS 18/30       ASSESSMENT/ PLAN:  TODAY  PAF (paroxsymal atrial fibrillation)  Severe protein calorie malnutrition Psychosis in elderly    Will continue current medications Will continue current plan of care Will continue to monitor her status   Time spent with patient: 40 minutes: medications; dietary; plan of care.    Britt Candle NP [facility]  call [phone]

## 2024-05-03 ENCOUNTER — Other Ambulatory Visit: Payer: Self-pay | Admitting: Adult Health

## 2024-05-03 ENCOUNTER — Non-Acute Institutional Stay (SKILLED_NURSING_FACILITY): Payer: Self-pay | Admitting: Student

## 2024-05-03 DIAGNOSIS — F0150 Vascular dementia without behavioral disturbance: Secondary | ICD-10-CM

## 2024-05-03 DIAGNOSIS — F323 Major depressive disorder, single episode, severe with psychotic features: Secondary | ICD-10-CM

## 2024-05-03 DIAGNOSIS — I480 Paroxysmal atrial fibrillation: Secondary | ICD-10-CM

## 2024-05-03 DIAGNOSIS — F5104 Psychophysiologic insomnia: Secondary | ICD-10-CM | POA: Diagnosis not present

## 2024-05-03 DIAGNOSIS — G2401 Drug induced subacute dyskinesia: Secondary | ICD-10-CM | POA: Diagnosis not present

## 2024-05-03 DIAGNOSIS — G43909 Migraine, unspecified, not intractable, without status migrainosus: Secondary | ICD-10-CM

## 2024-05-03 MED ORDER — ESZOPICLONE 1 MG PO TABS: 1.0000 mg | ORAL_TABLET | ORAL | 0 refills | Status: DC

## 2024-05-03 NOTE — Progress Notes (Signed)
 Location:  [facility]  Nursing Home Room Number:101 Place of Service:    SNF (31)   CODE STATUS: FULL  Allergies  Allergen Reactions   Ticlid [Ticlopidine] Shortness Of Breath   Nsaids Other (See Comments)    Bleeding ulcer   Prednisone Other (See Comments)    Makes her stomach hurt--knows this isn't an allergy but doesn't want to take it    Trazodone And Nefazodone      hjallucinations   Monistat [Miconazole] Swelling and Rash   Myrbetriq  [Mirabegron  Er] Rash    See 02/27/2023   Sulfa Antibiotics Rash    HPI:  [name] is in good spirits today. She reports she feels Fine. She notes her sleeping pill (Lunesta ) has been changed to every other night, despite her troubles with sleep. Per collateral information from facility nursing, patient has been doing better with the change- more alert and active during the day. Additionally Modafinil  has been changed to every other day dosing as well- she has been doing better with the decrease. She also mentions her resting tremor is bothersome, but it does not appear to impede her activities of daily living.   Past Medical History:  Diagnosis Date   Anemia    Arthritis    Asthma    Atrial fibrillation (HCC)    COVID-19 08/05/2022   Depression    Elevated C-reactive protein (CRP) 06/27/2023   H/O head injury    Hearing loss    Heart murmur    can be heard at times, Doctor said not to worry   History of kidney stones    History of stomach ulcers    Migraine    Pneumonia    Psittacosis    Stroke (HCC)    Weakness of neck 03/02/2013    Past Surgical History:  Procedure Laterality Date   ANKLE FRACTURE SURGERY Left    APPENDECTOMY     BREAST LUMPECTOMY     CESAREAN SECTION     x 3   I & D KNEE WITH POLY EXCHANGE Left 08/16/2022   Procedure: KNEE POLY EXCHANGE;  Surgeon: Cristy Bonner DASEN, MD;  Location: [facility] OR;  Service: Orthopedics;  Laterality: Left;   KNEE ARTHROSCOPY WITH PATELLAR TENDON REPAIR Left 08/16/2022    Procedure: IRRIGATION AND DEBRIDEMENT LEFT KNEE REVISION WITH PATELLAR TENDON REPAIR and poly exchange;  Surgeon: Cristy Bonner DASEN, MD;  Location: [facility] OR;  Service: Orthopedics;  Laterality: Left;   PARTIAL HYSTERECTOMY     ROTATOR CUFF REPAIR Right    TOTAL KNEE ARTHROPLASTY Left 07/22/2022   Procedure: TOTAL KNEE ARTHROPLASTY;  Surgeon: Edna Toribio LABOR, MD;  Location: [facility] ORS;  Service: Orthopedics;  Laterality: Left;    Social History   Socioeconomic History   Marital status: Married    Spouse name: Not on file   Number of children: 3   Years of education: Not on file   Highest education level: Not on file  Occupational History   Occupation: retired  Tobacco Use   Smoking status: Never   Smokeless tobacco: Never  Vaping Use   Vaping status: Never Used  Substance and Sexual Activity   Alcohol  use: Not Currently    Comment: occ beer   Drug use: No   Sexual activity: Yes    Birth control/protection: Surgical    Comment: hyst  Other Topics Concern   Not on file  Social History Narrative   Not on file   Social Drivers of Health   Financial  Resource Strain: Medium Risk (01/30/2021)   Overall Financial Resource Strain (CARDIA)    Difficulty of Paying Living Expenses: Somewhat hard  Food Insecurity: No Food Insecurity (08/15/2022)   Hunger Vital Sign    Worried About Running Out of Food in the Last Year: Never true    Ran Out of Food in the Last Year: Never true  Transportation Needs: No Transportation Needs (08/15/2022)   PRAPARE - Administrator, Civil Service (Medical): No    Lack of Transportation (Non-Medical): No  Physical Activity: Insufficiently Active (01/30/2021)   Exercise Vital Sign    Days of Exercise per Week: 2 days    Minutes of Exercise per Session: 10 min  Stress: No Stress Concern Present (01/30/2021)   [employer] of Occupational Health - Occupational Stress Questionnaire    Feeling of Stress : Only a little  Social Connections:  Moderately Integrated (01/30/2021)   Social Connection and Isolation Panel    Frequency of Communication with Friends and Family: Twice a week    Frequency of Social Gatherings with Friends and Family: Twice a week    Attends Religious Services: More than 4 times per year    Active Member of [employer] or Organizations: No    Attends Banker Meetings: Never    Marital Status: Married  Catering manager Violence: Not At Risk (08/15/2022)   Humiliation, Afraid, Rape, and Kick questionnaire    Fear of Current or Ex-Partner: No    Emotionally Abused: No    Physically Abused: No    Sexually Abused: No   Family History  Problem Relation Age of Onset   Cancer Father    Heart disease Brother    Alcohol  abuse Brother    Depression Maternal Grandmother    Depression Grandchild    Breast cancer Daughter       VITAL SIGNS There were no vitals taken for this visit.  Outpatient Encounter Medications as of 05/03/2024  Medication Sig   acetaminophen  (TYLENOL ) 650 MG CR tablet Take 650 mg by mouth every 8 (eight) hours.   apixaban  (ELIQUIS ) 5 MG TABS tablet Take 1 tablet (5 mg total) by mouth 2 (two) times daily.   ARIPiprazole  (ABILIFY ) 5 MG tablet Take 5 mg by mouth daily.   busPIRone  (BUSPAR ) 10 MG tablet Take 10 mg by mouth 3 (three) times daily.   cyanocobalamin  1000 MCG tablet Take 1,000 mcg by mouth daily.   donepezil (ARICEPT) 5 MG tablet Take 5 mg by mouth at bedtime.   DULoxetine  (CYMBALTA ) 60 MG capsule Take 60 mg by mouth daily.   eszopiclone  (LUNESTA ) 1 MG TABS tablet Take 1 tablet (1 mg total) by mouth every other day. Take immediately before bedtime   furosemide  (LASIX ) 20 MG tablet Take 20 mg by mouth.   Magnesium  Hydroxide (MILK OF MAGNESIA PO) Take 30 mLs by mouth as needed (constipation).   metoprolol  tartrate (LOPRESSOR ) 25 MG tablet Take 0.5 tablets (12.5 mg total) by mouth 2 (two) times daily.   modafinil  (PROVIGIL ) 100 MG tablet Take 100 mg by mouth every other  day.   rosuvastatin (CRESTOR) 5 MG tablet Take 5 mg by mouth at bedtime.   senna (SENOKOT) 8.6 MG TABS tablet Take 1 tablet (8.6 mg total) by mouth 2 (two) times daily.   SUMAtriptan  (IMITREX ) 100 MG tablet Take 100 mg by mouth every 2 (two) hours as needed for migraine or headache. May repeat in 2 hours if headache persists or recurs.   tetrabenazine (  XENAZINE) 12.5 MG tablet Take 25 mg by mouth 2 (two) times daily.   UNABLE TO FIND Diet: Regular   Vitamins A & D (VITAMIN A & D) ointment Apply 1 Application topically 2 (two) times daily. To left heel   ZINC OXIDE EX Apply 1 Application topically See admin instructions. every shift and prn Special Instructions: Apply to sacrum, bilateral buttocks and coccyx q shift for redness.   [DISCONTINUED] eszopiclone  (LUNESTA ) 1 MG TABS tablet Take 1 tablet (1 mg total) by mouth every other day. Take immediately before bedtime   No facility-administered encounter medications on file as of 05/03/2024.     SIGNIFICANT DIAGNOSTIC EXAMS No new labs.  Physical Exam General: NAD, pleasant,  HEENT: Normocephalic, atraumatic head. Normal external ear bilaterally. EOM intact and normal conjunctiva bilaterally. Normal external nose. Cardio: RRR, no MRG. Cap Refill <2s. Respiratory: CTAB, normal wob on RA GI: Abdomen is soft, not tender, not distended. BS present Skin: Warm and dry Neuro: Awake, alert, active. No overt focal neurologic deficits. Resting tremor, resolves with purposeful movement.   ASSESSMENT/ PLAN:  TODAY PAF (paroxsymal atrial fibrillation): Rate controlled. Continue Metoprolol  25 mg twice daily. Continue Eliquis  5 mg twice daily. Vascular Dementia without Behavioral Disturbance: Continue aricept 5 mg nightly Psychosis in elderly/Major Depression with psychotic features: Continue abilify  5 mg daily, cymbalta  60 mg daily, and Buspar  10 mg three times daily. Migraine without status migrainosus: Imitrex  PRN Tardive Dyskinesia: Continue  tetrabenazine 25 mg twice daily Chronic Insomnia: Continue every other night Lunesta . If activity level and cognition continue to improve, wean Modafinil  further.   Barnie Seip NP [facility]  Contact [phone] Monday through Friday 8am- 5pm  After hours call [phone]

## 2024-05-10 DIAGNOSIS — F411 Generalized anxiety disorder: Secondary | ICD-10-CM | POA: Diagnosis not present

## 2024-05-10 DIAGNOSIS — F332 Major depressive disorder, recurrent severe without psychotic features: Secondary | ICD-10-CM | POA: Diagnosis not present

## 2024-05-10 DIAGNOSIS — F5105 Insomnia due to other mental disorder: Secondary | ICD-10-CM | POA: Diagnosis not present

## 2024-05-10 DIAGNOSIS — G2401 Drug induced subacute dyskinesia: Secondary | ICD-10-CM | POA: Diagnosis not present

## 2024-05-11 ENCOUNTER — Encounter: Payer: Self-pay | Admitting: Adult Health

## 2024-05-11 ENCOUNTER — Non-Acute Institutional Stay (SKILLED_NURSING_FACILITY): Payer: Self-pay | Admitting: Adult Health

## 2024-05-11 ENCOUNTER — Other Ambulatory Visit: Payer: Self-pay

## 2024-05-11 DIAGNOSIS — F0150 Vascular dementia without behavioral disturbance: Secondary | ICD-10-CM | POA: Diagnosis not present

## 2024-05-11 DIAGNOSIS — I480 Paroxysmal atrial fibrillation: Secondary | ICD-10-CM

## 2024-05-11 DIAGNOSIS — F5104 Psychophysiologic insomnia: Secondary | ICD-10-CM

## 2024-05-11 DIAGNOSIS — G43909 Migraine, unspecified, not intractable, without status migrainosus: Secondary | ICD-10-CM | POA: Diagnosis not present

## 2024-05-11 DIAGNOSIS — G2401 Drug induced subacute dyskinesia: Secondary | ICD-10-CM | POA: Diagnosis not present

## 2024-05-11 DIAGNOSIS — R400 Somnolence: Secondary | ICD-10-CM | POA: Diagnosis not present

## 2024-05-11 NOTE — Progress Notes (Signed)
 Location:  [facility] Nursing Home Room Number: 101W Place of Service:  SNF (31)   CODE STATUS: FULL  Allergies  Allergen Reactions   Ticlid [Ticlopidine] Shortness Of Breath   Nsaids Other (See Comments)    Bleeding ulcer   Prednisone Other (See Comments)    Makes her stomach hurt--knows this isn't an allergy but doesn't want to take it    Trazodone And Nefazodone      hjallucinations   Monistat [Miconazole] Swelling and Rash   Myrbetriq  [Mirabegron  Er] Rash    See 02/27/2023   Sulfa Antibiotics Rash    Chief Complaint  Patient presents with   Medical Management of Chronic Issues    HPI:    Past Medical History:  Diagnosis Date   Anemia    Arthritis    Asthma    Atrial fibrillation (HCC)    COVID-19 08/05/2022   Depression    Elevated C-reactive protein (CRP) 06/27/2023   H/O head injury    Hearing loss    Heart murmur    can be heard at times, Doctor said not to worry   History of kidney stones    History of stomach ulcers    Migraine    Pneumonia    Psittacosis    Stroke (HCC)    Weakness of neck 03/02/2013    Past Surgical History:  Procedure Laterality Date   ANKLE FRACTURE SURGERY Left    APPENDECTOMY     BREAST LUMPECTOMY     CESAREAN SECTION     x 3   I & D KNEE WITH POLY EXCHANGE Left 08/16/2022   Procedure: KNEE POLY EXCHANGE;  Surgeon: Cristy Bonner DASEN, MD;  Location: [facility] OR;  Service: Orthopedics;  Laterality: Left;   KNEE ARTHROSCOPY WITH PATELLAR TENDON REPAIR Left 08/16/2022   Procedure: IRRIGATION AND DEBRIDEMENT LEFT KNEE REVISION WITH PATELLAR TENDON REPAIR and poly exchange;  Surgeon: Cristy Bonner DASEN, MD;  Location: [facility] OR;  Service: Orthopedics;  Laterality: Left;   PARTIAL HYSTERECTOMY     ROTATOR CUFF REPAIR Right    TOTAL KNEE ARTHROPLASTY Left 07/22/2022   Procedure: TOTAL KNEE ARTHROPLASTY;  Surgeon: Edna Toribio LABOR, MD;  Location: [facility] OR[facility];  Service: Orthopedics;  Laterality: Left;    Social History   Socioeconomic  History   Marital status: Married    Spouse name: Not on file   Number of children: 3   Years of education: Not on file   Highest education level: Not on file  Occupational History   Occupation: retired  Tobacco Use   Smoking status: Never   Smokeless tobacco: Never  Vaping Use   Vaping status: Never Used  Substance and Sexual Activity   Alcohol  use: Not Currently    Comment: occ beer   Drug use: No   Sexual activity: Yes    Birth control/protection: Surgical    Comment: hyst  Other Topics Concern   Not on file  Social History Narrative   Not on file   Social Drivers of Health   Financial Resource Strain: Medium Risk (01/30/2021)   Overall Financial Resource Strain (CARDIA)    Difficulty of Paying Living Expenses: Somewhat hard  Food Insecurity: No Food Insecurity (08/15/2022)   Hunger Vital Sign    Worried About Running Out of Food in the Last Year: Never true    Ran Out of Food in the Last Year: Never true  Transportation Needs: No Transportation Needs (08/15/2022)   PRAPARE - Transportation    Lack  of Transportation (Medical): No    Lack of Transportation (Non-Medical): No  Physical Activity: Insufficiently Active (01/30/2021)   Exercise Vital Sign    Days of Exercise per Week: 2 days    Minutes of Exercise per Session: 10 min  Stress: No Stress Concern Present (01/30/2021)   [employer] of Occupational Health - Occupational Stress Questionnaire    Feeling of Stress : Only a little  Social Connections: Moderately Integrated (01/30/2021)   Social Connection and Isolation Panel    Frequency of Communication with Friends and Family: Twice a week    Frequency of Social Gatherings with Friends and Family: Twice a week    Attends Religious Services: More than 4 times per year    Active Member of [employer] or Organizations: No    Attends Banker Meetings: Never    Marital Status: Married  Catering manager Violence: Not At Risk (08/15/2022)   Humiliation,  Afraid, Rape, and Kick questionnaire    Fear of Current or Ex-Partner: No    Emotionally Abused: No    Physically Abused: No    Sexually Abused: No   Family History  Problem Relation Age of Onset   Cancer Father    Heart disease Brother    Alcohol  abuse Brother    Depression Maternal Grandmother    Depression Grandchild    Breast cancer Daughter       VITAL SIGNS BP 132/68   Pulse 75   Temp (!) 97.4 F (36.3 C)   Resp 20   Ht 5' (1.524 m)   Wt 164 lb 9.6 oz (74.7 kg)   SpO2 97%   BMI 32.15 kg/m   Outpatient Encounter Medications as of 05/11/2024  Medication Sig   acetaminophen  (TYLENOL ) 650 MG CR tablet Take 650 mg by mouth every 8 (eight) hours.   apixaban  (ELIQUIS ) 5 MG TABS tablet Take 1 tablet (5 mg total) by mouth 2 (two) times daily.   ARIPiprazole  (ABILIFY ) 5 MG tablet Take 5 mg by mouth daily.   busPIRone  (BUSPAR ) 10 MG tablet Take 10 mg by mouth 3 (three) times daily.   cyanocobalamin  1000 MCG tablet Take 1,000 mcg by mouth daily.   donepezil (ARICEPT) 5 MG tablet Take 5 mg by mouth at bedtime.   DULoxetine  (CYMBALTA ) 60 MG capsule Take 60 mg by mouth daily.   eszopiclone  (LUNESTA ) 1 MG TABS tablet Take 1 tablet (1 mg total) by mouth every other day. Take immediately before bedtime   furosemide  (LASIX ) 20 MG tablet Take 20 mg by mouth.   loratadine (CLARITIN) 10 MG tablet Take 10 mg by mouth daily as needed for allergies.   metoprolol  tartrate (LOPRESSOR ) 25 MG tablet Take 0.5 tablets (12.5 mg total) by mouth 2 (two) times daily.   modafinil  (PROVIGIL ) 100 MG tablet Take 100 mg by mouth every other day.   Polyethyl Glycol-Propyl Glycol (SYSTANE) 0.4-0.3 % SOLN Apply to eye 2 (two) times daily as needed.   rosuvastatin (CRESTOR) 5 MG tablet Take 5 mg by mouth at bedtime.   senna (SENOKOT) 8.6 MG TABS tablet Take 1 tablet (8.6 mg total) by mouth 2 (two) times daily. (Patient taking differently: Take 1 tablet by mouth daily as needed.)   SUMAtriptan  (IMITREX ) 100 MG  tablet Take 100 mg by mouth every 2 (two) hours as needed for migraine or headache. May repeat in 2 hours if headache persists or recurs.   tetrabenazine (XENAZINE) 12.5 MG tablet Take 25 mg by mouth 2 (two) times daily.  UNABLE TO FIND Diet: Regular   Magnesium  Hydroxide (MILK OF MAGNESIA PO) Take 30 mLs by mouth as needed (constipation). (Patient not taking: Reported on 05/11/2024)   Vitamins A & D (VITAMIN A & D) ointment Apply 1 Application topically 2 (two) times daily. To left heel (Patient not taking: Reported on 05/11/2024)   ZINC OXIDE EX Apply 1 Application topically See admin instructions. every shift and prn Special Instructions: Apply to sacrum, bilateral buttocks and coccyx q shift for redness. (Patient not taking: Reported on 05/11/2024)   No facility-administered encounter medications on file as of 05/11/2024.     SIGNIFICANT DIAGNOSTIC EXAMS       ASSESSMENT/ PLAN:     Barnie Seip NP [facility]  Contact [phone] Monday through Friday 8am- 5pm  After hours call [phone]

## 2024-05-13 ENCOUNTER — Other Ambulatory Visit: Payer: Self-pay | Admitting: Adult Health

## 2024-05-13 MED ORDER — MODAFINIL 100 MG PO TABS: 100.0000 mg | ORAL_TABLET | Freq: Every day | ORAL | 0 refills | Status: DC

## 2024-05-13 MED ORDER — ESZOPICLONE 1 MG PO TABS: 1.0000 mg | ORAL_TABLET | Freq: Every day | ORAL | 0 refills | Status: DC

## 2024-05-24 NOTE — Progress Notes (Signed)
 Location:  [facility] Room Number: 101W Place of Service:  SNF (31)   CODE STATUS: full   Allergies  Allergen Reactions   Ticlid [Ticlopidine] Shortness Of Breath   Nsaids Other (See Comments)    Bleeding ulcer   Prednisone Other (See Comments)    Makes her stomach hurt--knows this isn't an allergy but doesn't want to take it    Trazodone And Nefazodone      hjallucinations   Monistat [Miconazole] Swelling and Rash   Myrbetriq  [Mirabegron  Er] Rash    See 02/27/2023   Sulfa Antibiotics Rash    Chief Complaint  Patient presents with   Medical Management of Chronic Issues     Tardive dyskinesia: Somnolence:  Chronic insomnia:     HPI:  She is a 78 y.o. long term resident of this facility being seen for the management of her chronic illnesses:Tardive dyskinesia: Somnolence:  Chronic insomnia. She has not tolerated the wean from lunesta  and provigil . She is having increased tremors; complaining of insomnia; fatigue.    Past Medical History:  Diagnosis Date   Anemia    Arthritis    Asthma    Atrial fibrillation (HCC)    COVID-19 08/05/2022   Depression    Elevated C-reactive protein (CRP) 06/27/2023   H/O head injury    Hearing loss    Heart murmur    can be heard at times, Doctor said not to worry   History of kidney stones    History of stomach ulcers    Migraine    Pneumonia    Psittacosis    Stroke (HCC)    Weakness of neck 03/02/2013    Past Surgical History:  Procedure Laterality Date   ANKLE FRACTURE SURGERY Left    APPENDECTOMY     BREAST LUMPECTOMY     CESAREAN SECTION     x 3   I & D KNEE WITH POLY EXCHANGE Left 08/16/2022   Procedure: KNEE POLY EXCHANGE;  Surgeon: Cristy Bonner DASEN, MD;  Location: [facility] OR;  Service: Orthopedics;  Laterality: Left;   KNEE ARTHROSCOPY WITH PATELLAR TENDON REPAIR Left 08/16/2022   Procedure: IRRIGATION AND DEBRIDEMENT LEFT KNEE REVISION WITH PATELLAR TENDON REPAIR and poly exchange;  Surgeon: Cristy Bonner DASEN, MD;  Location: [facility] OR;  Service: Orthopedics;  Laterality: Left;   PARTIAL HYSTERECTOMY     ROTATOR CUFF REPAIR Right    TOTAL KNEE ARTHROPLASTY Left 07/22/2022   Procedure: TOTAL KNEE ARTHROPLASTY;  Surgeon: Edna Toribio LABOR, MD;  Location: [facility] ORS;  Service: Orthopedics;  Laterality: Left;    Social History   Socioeconomic History   Marital status: Married    Spouse name: Not on file   Number of children: 3   Years of education: Not on file   Highest education level: Not on file  Occupational History   Occupation: retired  Tobacco Use   Smoking status: Never   Smokeless tobacco: Never  Vaping Use   Vaping status: Never Used  Substance and Sexual Activity   Alcohol  use: Not Currently    Comment: occ beer   Drug use: No   Sexual activity: Yes    Birth control/protection: Surgical    Comment: hyst  Other Topics Concern   Not on file  Social History Narrative   Not on file   Social Drivers of Health   Financial Resource Strain: Medium Risk (01/30/2021)   Overall Financial Resource Strain (CARDIA)    Difficulty of Paying Living Expenses: Somewhat  hard  Food Insecurity: No Food Insecurity (08/15/2022)   Hunger Vital Sign    Worried About Running Out of Food in the Last Year: Never true    Ran Out of Food in the Last Year: Never true  Transportation Needs: No Transportation Needs (08/15/2022)   PRAPARE - Administrator, Civil Service (Medical): No    Lack of Transportation (Non-Medical): No  Physical Activity: Insufficiently Active (01/30/2021)   Exercise Vital Sign    Days of Exercise per Week: 2 days    Minutes of Exercise per Session: 10 min  Stress: No Stress Concern Present (01/30/2021)   [employer] of Occupational Health - Occupational Stress Questionnaire    Feeling of Stress : Only a little  Social Connections: Moderately Integrated (01/30/2021)   Social Connection and Isolation Panel    Frequency of Communication with Friends and Family:  Twice a week    Frequency of Social Gatherings with Friends and Family: Twice a week    Attends Religious Services: More than 4 times per year    Active Member of [employer] or Organizations: No    Attends Banker Meetings: Never    Marital Status: Married  Catering manager Violence: Not At Risk (08/15/2022)   Humiliation, Afraid, Rape, and Kick questionnaire    Fear of Current or Ex-Partner: No    Emotionally Abused: No    Physically Abused: No    Sexually Abused: No   Family History  Problem Relation Age of Onset   Cancer Father    Heart disease Brother    Alcohol  abuse Brother    Depression Maternal Grandmother    Depression Grandchild    Breast cancer Daughter       VITAL SIGNS BP 132/68   Pulse 75   Temp (!) 97.4 F (36.3 C)   Resp 20   Ht 5' (1.524 m)   Wt 164 lb 9.6 oz (74.7 kg)   SpO2 97%   BMI 32.15 kg/m   Outpatient Encounter Medications as of 05/11/2024  Medication Sig   acetaminophen  (TYLENOL ) 650 MG CR tablet Take 650 mg by mouth every 8 (eight) hours.   apixaban  (ELIQUIS ) 5 MG TABS tablet Take 1 tablet (5 mg total) by mouth 2 (two) times daily.   ARIPiprazole  (ABILIFY ) 5 MG tablet Take 5 mg by mouth daily.   busPIRone  (BUSPAR ) 10 MG tablet Take 10 mg by mouth 3 (three) times daily.   cyanocobalamin  1000 MCG tablet Take 1,000 mcg by mouth daily.   donepezil (ARICEPT) 5 MG tablet Take 5 mg by mouth at bedtime.   DULoxetine  (CYMBALTA ) 60 MG capsule Take 60 mg by mouth daily.   furosemide  (LASIX ) 20 MG tablet Take 20 mg by mouth.   loratadine (CLARITIN) 10 MG tablet Take 10 mg by mouth daily as needed for allergies.   metoprolol  tartrate (LOPRESSOR ) 25 MG tablet Take 0.5 tablets (12.5 mg total) by mouth 2 (two) times daily.   Polyethyl Glycol-Propyl Glycol (SYSTANE) 0.4-0.3 % SOLN Apply to eye 2 (two) times daily as needed.   rosuvastatin (CRESTOR) 5 MG tablet Take 5 mg by mouth at bedtime.   senna (SENOKOT) 8.6 MG TABS tablet Take 1 tablet (8.6 mg  total) by mouth 2 (two) times daily. (Patient taking differently: Take 1 tablet by mouth daily as needed.)   SUMAtriptan  (IMITREX ) 100 MG tablet Take 100 mg by mouth every 2 (two) hours as needed for migraine or headache. May repeat in 2 hours if headache  persists or recurs.   tetrabenazine (XENAZINE) 12.5 MG tablet Take 25 mg by mouth 2 (two) times daily.   UNABLE TO FIND Diet: Regular   eszopiclone  (LUNESTA ) 1 MG TABS tablet Take 1 tablet (1 mg total) Take immediately before bedtime   modafinil  (PROVIGIL ) 100 MG tablet Take 100 mg by mouth every day.   Magnesium  Hydroxide (MILK OF MAGNESIA PO) Take 30 mLs by mouth as needed (constipation). (Patient not taking: Reported on 05/11/2024)   No facility-administered encounter medications on file as of 05/11/2024.     SIGNIFICANT DIAGNOSTIC EXAMS  LABS REVIEWED PREVIOUS   06-26-23: wbc 6.1; hgb 11.7; hct 36.2; mcv 98.4 plt 160; glucose 97; bun 14; creat 0.57; k+ 3.9; na++ 135; ca 8.7; gfr >60 d-dimer 0.28  CRP 3.0 10-16-23: chol 136; ldl 66; trig 70 hdl 56 11-06-23: glucose 154; bun 27; creat 0.80; k+ 3.6; na++ 139; ca 9.3 gfr >60   12-11-23: wbc 5.0; hgb 11.9; hct 36.7; mcv 99.5 plt 181; hgb A1c 5.3; tsh 2.295 01-12-24: vitamin B12: 225; folate 10.7; tsh 2.081; iron 77 tibc 334 03-18-24: glucose 89; bun 23 creat 0.67; k+ 3.8; na++ 138; ca 9.3 gfr >60; protein 6.5 albumin 3.6; sed rate 10; vitamin B12: 1054; RA factor: <10; ANA neg   NO NEW LABS    Review of Systems  Constitutional:  Positive for malaise/fatigue.  Respiratory:  Negative for cough and shortness of breath.   Cardiovascular:  Negative for chest pain, palpitations and leg swelling.  Gastrointestinal:  Negative for abdominal pain, constipation and heartburn.  Musculoskeletal:  Negative for back pain, joint pain and myalgias.  Skin: Negative.   Neurological:  Positive for tremors. Negative for dizziness.  Psychiatric/Behavioral:  The patient is nervous/anxious and has insomnia.     Physical Exam Constitutional:      General: She is not in acute distress.    Appearance: She is well-developed. She is obese. She is not diaphoretic.  Neck:     Thyroid : No thyromegaly.  Cardiovascular:     Rate and Rhythm: Normal rate and regular rhythm.     Heart sounds: Normal heart sounds.  Pulmonary:     Effort: Pulmonary effort is normal. No respiratory distress.     Breath sounds: Normal breath sounds.  Abdominal:     General: Bowel sounds are normal. There is no distension.     Palpations: Abdomen is soft.     Tenderness: There is no abdominal tenderness.  Musculoskeletal:        General: Normal range of motion.     Cervical back: Neck supple.     Right lower leg: No edema.     Left lower leg: No edema.  Lymphadenopathy:     Cervical: No cervical adenopathy.  Skin:    General: Skin is warm and dry.  Neurological:     Mental Status: She is alert. Mental status is at baseline.     Comments:  Has more abnormal facial movements and tremors         10-14-22: SLUMS 18/30    Psychiatric:        Mood and Affect: Mood normal.        ASSESSMENT/ PLAN:  TODAY  Tardive dyskinesia: is having increased movements and tremors; will increase tetrabenzine to 27.5 mg twice daily and will monitor her status. Failed both ingrezza  and austeda   2. Somnolence: did not tolerate every other day dosing will continue provigil  100 mg daily  3.  Chronic insomnia: did  not tolerate every other night dosing will continue lunesta  1 mg nightly   PREVIOUS   4. Chronic anemia: hgb 11.9; iron studies normal    5. Psychosis in elderly/major depression with psychotic features: will continue abilify  5 mg daily cymbalta  60 mg daily buspar  20 mg twice daily   6. Protein calorie malnutrition: will continue supplements as directed  7. Vitamin B 12: deficiency: level is 1054; will continue  1,000 mcg daily   8. Urinary incontinence: unable to tolerate myrbetriq   9. Longstanding persistent  atrial fibrillation/history of DVT: heart rate is stable will continue eliquis  5 mg twice daily and is taking lopressor  25 mg twice daily for rate control.   10. Migraine without status migrainosus, non intractable unspecified; type: has prn imitrex   11. Uncomplicated asthma unspecified asthma severity unspecified, unspecified whether persistent: is presently off medications will monitor  12. Chronic constipation: will continue senna s twice daily    13. Vascular dementia without behavioral disturbance/neurocognitive deficit: weight is 164 pounds; is on aricept 5 mg nightly   14. Chronic pain syndrome: is on cymbalta  60 mg daily and tylenol  650 mg nightly   15. Bilateral lower extremity edema: will continue lasix  20 mg daily       Barnie Seip NP [facility]  call [phone]

## 2024-05-25 ENCOUNTER — Other Ambulatory Visit: Payer: Self-pay | Admitting: Adult Health

## 2024-06-07 DIAGNOSIS — F332 Major depressive disorder, recurrent severe without psychotic features: Secondary | ICD-10-CM | POA: Diagnosis not present

## 2024-06-07 DIAGNOSIS — F411 Generalized anxiety disorder: Secondary | ICD-10-CM | POA: Diagnosis not present

## 2024-06-07 DIAGNOSIS — G2401 Drug induced subacute dyskinesia: Secondary | ICD-10-CM | POA: Diagnosis not present

## 2024-06-07 DIAGNOSIS — F5105 Insomnia due to other mental disorder: Secondary | ICD-10-CM | POA: Diagnosis not present

## 2024-06-09 ENCOUNTER — Other Ambulatory Visit: Payer: Self-pay | Admitting: Adult Health

## 2024-06-09 DIAGNOSIS — L602 Onychogryphosis: Secondary | ICD-10-CM | POA: Diagnosis not present

## 2024-06-09 DIAGNOSIS — I739 Peripheral vascular disease, unspecified: Secondary | ICD-10-CM | POA: Diagnosis not present

## 2024-06-09 MED ORDER — ESZOPICLONE 1 MG PO TABS: 1.0000 mg | ORAL_TABLET | Freq: Every day | ORAL | 0 refills | Status: DC

## 2024-06-09 MED ORDER — MODAFINIL 100 MG PO TABS: 100.0000 mg | ORAL_TABLET | Freq: Every day | ORAL | 0 refills | Status: DC

## 2024-06-15 ENCOUNTER — Non-Acute Institutional Stay (SKILLED_NURSING_FACILITY): Payer: Self-pay | Admitting: Internal Medicine

## 2024-06-15 ENCOUNTER — Encounter: Payer: Self-pay | Admitting: Internal Medicine

## 2024-06-15 DIAGNOSIS — G2401 Drug induced subacute dyskinesia: Secondary | ICD-10-CM | POA: Diagnosis not present

## 2024-06-15 DIAGNOSIS — I480 Paroxysmal atrial fibrillation: Secondary | ICD-10-CM

## 2024-06-15 DIAGNOSIS — E538 Deficiency of other specified B group vitamins: Secondary | ICD-10-CM

## 2024-06-15 NOTE — Assessment & Plan Note (Addendum)
 Rhythm is regular and rate well-controlled.  She is on apixaban  prophylaxis.  Blood pressures are soft on low-dose metoprolol .

## 2024-06-15 NOTE — Progress Notes (Unsigned)
 NURSING HOME LOCATION:  [facility] ROOM NUMBER:  36 W  CODE STATUS:Full Code  PCP: [name], NP   This is a nursing facility follow up visit of chronic medical diagnoses to document compliance with Regulation 483.30 (c) in The Long Term Care Survey Manual Phase 2 which mandates caregiver visit ( visits can alternate among physician, PA or NP as per statutes) within 10 days of 30 days / 60 days/ 90 days post admission to SNF date  .  Interim medical record and care since last SNF visit was updated with review of diagnostic studies and change in clinical status since last visit were documented.  HPI: She is a permanent resident of this facility with medical diagnoses of history of strokes; past history of psittacosis; history of migraine headaches; history of GI ulcers; history of nephrolithiasis;  tardive dyskinesia;atrial fibrillation; and history of asthma. The most recent labs were completed 03/18/2024 and were normal except for GFR greater than 60.  B12 level was supranormal at 1054; it had been low normal at 225 on 01/12/2024.  Despite the low B12 level her indices have been normochromic, normocytic.  She has had only minimal anemia with  hemoglobin ranging from 11.7-11.9.  Review of systems: Her major complaint was cannot get it together, trying to get better.  She is mainly referring to her lack of mobilization due to her neuromuscular issues.  She does describe intermittent nonproductive cough.  She feels that her tremors are worse.  She denies ever having seen a neurologist in reference to this.  Constitutional: No fever, significant weight change, fatigue  Eyes: No redness, discharge, pain, vision change ENT/mouth: No nasal congestion,  purulent discharge, earache, change in hearing, sore throat  Cardiovascular: No chest pain, palpitations, paroxysmal nocturnal dyspnea, claudication, edema  Respiratory: No sputum production, hemoptysis, DOE, significant  snoring, apnea   Gastrointestinal: No heartburn, dysphagia, abdominal pain, nausea /vomiting, rectal bleeding, melena, change in bowels Genitourinary: No dysuria, hematuria, pyuria, incontinence, nocturia Dermatologic: No rash, pruritus, change in appearance of skin Neurologic: No dizziness, headache, syncope, seizures, numbness, tingling Psychiatric: No significant anxiety, depression, insomnia, anorexia Endocrine: No change in hair/skin/nails, excessive thirst, excessive hunger, excessive urination  Hematologic/lymphatic: No significant bruising, lymphadenopathy, abnormal bleeding Allergy/immunology: No itchy/watery eyes, significant sneezing, urticaria, angioedema  Physical exam:  Pertinent or positive findings: She is in a wheelchair.  Facies are masked.  She does have difficulty with word retrieval.  She exhibits resting tremor of the head and upper extremities which worsens as she interacts.  She exhibits intermittent lipsmacking.  She has a grade 1/2-1 systolic murmur.  Second heart sound is increased.  Abdomen slightly protuberant.  She has 1+ edema of the lower extremities.  The right foot is inverted.  Pedal pulses are palpable but decreased.  Tremor of the head and extremities is coarse but there is a pill-rolling quality to it especially in the left hand.  Interosseous wasting of the hands is noted.  General appearance: no acute distress, increased work of breathing is present.   Lymphatic: No lymphadenopathy about the head, neck, axilla. Eyes: No conjunctival inflammation or lid edema is present. There is no scleral icterus. Ears:  External ear exam shows no significant lesions or deformities.   Nose:  External nasal examination shows no deformity or inflammation. Nasal mucosa are pink and moist without lesions, exudates Oral exam:  Lips and gums are healthy appearing. There is no oropharyngeal erythema or exudate. Neck:  No thyromegaly, masses,  tenderness noted.    Heart:  No  gallop, click, rub .  Lungs: Chest clear to auscultation without wheezes, rhonchi, rales, rubs. Abdomen: Bowel sounds are normal. Abdomen is soft and nontender with no organomegaly, hernias, masses. GU: Deferred  Extremities:  No cyanosis, clubbing Neurologic exam :Balance, Rhomberg, finger to nose testing could not be completed due to clinical state Skin: Warm & dry w/o tenting. No significant lesions or rash.  See summary under each active problem in the Problem List with associated updated therapeutic plan

## 2024-06-15 NOTE — Assessment & Plan Note (Signed)
 Most recent B12 level 03/18/2024 was supranormal.  No change indicated.

## 2024-06-15 NOTE — Patient Instructions (Signed)
 See assessment and plan under each diagnosis in the problem list and acutely for this visit

## 2024-06-15 NOTE — Assessment & Plan Note (Signed)
 She exhibits coarse tremor of her head and hands but there is also somewhat of a pill-rolling component in the left hand.  Facies are masked.  Consideration could be given to propranolol to treat the tremor; problematic is her history of asthma.  On today's exam her chest is clear with no wheezing.  In addition to the parkinsonian picture her polypharmacy may be playing a role in the neuromuscular signs. Up to Date will be reviewed to assess specific drug risk for TD.

## 2024-06-23 DIAGNOSIS — G2401 Drug induced subacute dyskinesia: Secondary | ICD-10-CM | POA: Diagnosis not present

## 2024-06-23 DIAGNOSIS — F411 Generalized anxiety disorder: Secondary | ICD-10-CM | POA: Diagnosis not present

## 2024-06-23 DIAGNOSIS — F5105 Insomnia due to other mental disorder: Secondary | ICD-10-CM | POA: Diagnosis not present

## 2024-06-23 DIAGNOSIS — F332 Major depressive disorder, recurrent severe without psychotic features: Secondary | ICD-10-CM | POA: Diagnosis not present

## 2024-06-25 ENCOUNTER — Encounter: Payer: Self-pay | Admitting: Adult Health

## 2024-07-08 ENCOUNTER — Other Ambulatory Visit: Payer: Self-pay | Admitting: Adult Health

## 2024-07-08 MED ORDER — ESZOPICLONE 1 MG PO TABS: 1.0000 mg | ORAL_TABLET | Freq: Every day | ORAL | 0 refills | Status: DC

## 2024-07-08 MED ORDER — MODAFINIL 100 MG PO TABS: 100.0000 mg | ORAL_TABLET | Freq: Every day | ORAL | 0 refills | Status: DC

## 2024-07-13 ENCOUNTER — Non-Acute Institutional Stay (SKILLED_NURSING_FACILITY): Payer: Self-pay | Admitting: Adult Health

## 2024-07-13 ENCOUNTER — Encounter: Payer: Self-pay | Admitting: Adult Health

## 2024-07-13 DIAGNOSIS — F323 Major depressive disorder, single episode, severe with psychotic features: Secondary | ICD-10-CM

## 2024-07-13 DIAGNOSIS — D649 Anemia, unspecified: Secondary | ICD-10-CM | POA: Diagnosis not present

## 2024-07-13 DIAGNOSIS — F0390 Unspecified dementia without behavioral disturbance: Secondary | ICD-10-CM

## 2024-07-13 DIAGNOSIS — E440 Moderate protein-calorie malnutrition: Secondary | ICD-10-CM

## 2024-07-13 NOTE — Progress Notes (Unsigned)
 Location:  [facility] Room Number: 101 Place of Service:  SNF (31)   CODE STATUS: Full Code   Allergies  Allergen Reactions   Ticlid [Ticlopidine] Shortness Of Breath   Nsaids Other (See Comments)    Bleeding ulcer   Prednisone Other (See Comments)    Makes her stomach hurt--knows this isn't an allergy but doesn't want to take it    Trazodone And Nefazodone      hjallucinations   Monistat [Miconazole] Swelling and Rash   Myrbetriq  [Mirabegron  Er] Rash    See 02/27/2023   Sulfa Antibiotics Rash    Chief Complaint  Patient presents with   Medical Management of Chronic Issues         Chronic anemia:     Psychosis in elderly/major depression with psychotic features:  Protein calorie malnutrition    HPI:  She is a 78 y.o. long term resident of this facility being seen for the management of her chronic illnesses:Chronic anemia:     Psychosis in elderly/major depression with psychotic features:  Protein calorie malnutrition.  There are no reports of uncontrolled pain. Her tremors have improved since starting inderal twice daily . She denies any worsening anxiety.    Past Medical History:  Diagnosis Date   Anemia    Arthritis    Asthma    Atrial fibrillation (HCC)    COVID-19 08/05/2022   Depression    Elevated C-reactive protein (CRP) 06/27/2023   H/O head injury    Hearing loss    Heart murmur    can be heard at times, Doctor said not to worry   History of kidney stones    History of stomach ulcers    Migraine    Pneumonia    Psittacosis    Stroke (HCC)    Weakness of neck 03/02/2013    Past Surgical History:  Procedure Laterality Date   ANKLE FRACTURE SURGERY Left    APPENDECTOMY     BREAST LUMPECTOMY     CESAREAN SECTION     x 3   I & D KNEE WITH POLY EXCHANGE Left 08/16/2022   Procedure: KNEE POLY EXCHANGE;  Surgeon: Cristy Bonner DASEN, MD;  Location: [facility] OR;  Service: Orthopedics;  Laterality: Left;   KNEE ARTHROSCOPY WITH PATELLAR TENDON  REPAIR Left 08/16/2022   Procedure: IRRIGATION AND DEBRIDEMENT LEFT KNEE REVISION WITH PATELLAR TENDON REPAIR and poly exchange;  Surgeon: Cristy Bonner DASEN, MD;  Location: [facility] OR;  Service: Orthopedics;  Laterality: Left;   PARTIAL HYSTERECTOMY     ROTATOR CUFF REPAIR Right    TOTAL KNEE ARTHROPLASTY Left 07/22/2022   Procedure: TOTAL KNEE ARTHROPLASTY;  Surgeon: Edna Toribio LABOR, MD;  Location: [facility] ORS;  Service: Orthopedics;  Laterality: Left;    Social History   Socioeconomic History   Marital status: Married    Spouse name: Not on file   Number of children: 3   Years of education: Not on file   Highest education level: Not on file  Occupational History   Occupation: retired  Tobacco Use   Smoking status: Never   Smokeless tobacco: Never  Vaping Use   Vaping status: Never Used  Substance and Sexual Activity   Alcohol  use: Not Currently    Comment: occ beer   Drug use: No   Sexual activity: Yes    Birth control/protection: Surgical    Comment: hyst  Other Topics Concern   Not on file  Social History Narrative   Not on file  Social Drivers of Health   Financial Resource Strain: Medium Risk (01/30/2021)   Overall Financial Resource Strain (CARDIA)    Difficulty of Paying Living Expenses: Somewhat hard  Food Insecurity: No Food Insecurity (08/15/2022)   Hunger Vital Sign    Worried About Running Out of Food in the Last Year: Never true    Ran Out of Food in the Last Year: Never true  Transportation Needs: No Transportation Needs (08/15/2022)   PRAPARE - Administrator, Civil Service (Medical): No    Lack of Transportation (Non-Medical): No  Physical Activity: Insufficiently Active (01/30/2021)   Exercise Vital Sign    Days of Exercise per Week: 2 days    Minutes of Exercise per Session: 10 min  Stress: No Stress Concern Present (01/30/2021)   [employer] of Occupational Health - Occupational Stress Questionnaire    Feeling of Stress : Only a little   Social Connections: Moderately Integrated (01/30/2021)   Social Connection and Isolation Panel    Frequency of Communication with Friends and Family: Twice a week    Frequency of Social Gatherings with Friends and Family: Twice a week    Attends Religious Services: More than 4 times per year    Active Member of [employer] or Organizations: No    Attends Banker Meetings: Never    Marital Status: Married  Catering manager Violence: Not At Risk (08/15/2022)   Humiliation, Afraid, Rape, and Kick questionnaire    Fear of Current or Ex-Partner: No    Emotionally Abused: No    Physically Abused: No    Sexually Abused: No   Family History  Problem Relation Age of Onset   Cancer Father    Heart disease Brother    Alcohol  abuse Brother    Depression Maternal Grandmother    Depression Grandchild    Breast cancer Daughter       VITAL SIGNS BP 113/74   Pulse 78   Temp 98.5 F (36.9 C)   Resp 20   Ht 5' (1.524 m)   Wt 163 lb 6.4 oz (74.1 kg)   SpO2 97%   BMI 31.91 kg/m   Outpatient Encounter Medications as of 07/13/2024  Medication Sig   acetaminophen  (TYLENOL ) 650 MG CR tablet Take 650 mg by mouth every 8 (eight) hours.   apixaban  (ELIQUIS ) 5 MG TABS tablet Take 1 tablet (5 mg total) by mouth 2 (two) times daily.   ARIPiprazole  (ABILIFY ) 5 MG tablet Take 5 mg by mouth daily.   busPIRone  (BUSPAR ) 10 MG tablet Take 10 mg by mouth 3 (three) times daily.   chlorhexidine  (HIBICLENS ) 4 % external liquid Apply topically every other day. topical, Once A Day Every Other Day, Clean between all toes to both feet daily with Hibiclens  and soft gauze every other day   cyanocobalamin  1000 MCG tablet Take 1,000 mcg by mouth daily.   donepezil (ARICEPT) 5 MG tablet Take 5 mg by mouth at bedtime.   DULoxetine  (CYMBALTA ) 60 MG capsule Take 60 mg by mouth daily.   eszopiclone  (LUNESTA ) 1 MG TABS tablet Take 1 tablet (1 mg total) by mouth at bedtime. Take immediately before bedtime    furosemide  (LASIX ) 20 MG tablet Take 20 mg by mouth.   isosorbide dinitrate (ISORDIL) 10 MG tablet Take 10 mg by mouth 2 (two) times daily.   loratadine (CLARITIN) 10 MG tablet Take 10 mg by mouth daily as needed for allergies.   modafinil  (PROVIGIL ) 100 MG tablet Take 1 tablet (  100 mg total) by mouth daily.   Polyethyl Glycol-Propyl Glycol (SYSTANE) 0.4-0.3 % SOLN Apply to eye 2 (two) times daily as needed.   rosuvastatin (CRESTOR) 5 MG tablet Take 5 mg by mouth at bedtime.   senna-docusate (SENOKOT-S) 8.6-50 MG tablet Take 1 tablet by mouth once as needed for mild constipation.   SUMAtriptan  (IMITREX ) 100 MG tablet Take 100 mg by mouth every 2 (two) hours as needed for migraine or headache. May repeat in 2 hours if headache persists or recurs.   tetrabenazine (XENAZINE) 25 MG tablet Take 37.5 mg by mouth 2 (two) times daily.   UNABLE TO FIND Diet: Regular   No facility-administered encounter medications on file as of 07/13/2024.     SIGNIFICANT DIAGNOSTIC EXAMS  LABS REVIEWED PREVIOUS   10-16-23: chol 136; ldl 66; trig 70 hdl 56 11-06-23: glucose 154; bun 27; creat 0.80; k+ 3.6; na++ 139; ca 9.3 gfr >60   12-11-23: wbc 5.0; hgb 11.9; hct 36.7; mcv 99.5 plt 181; hgb A1c 5.3; tsh 2.295 01-12-24: vitamin B12: 225; folate 10.7; tsh 2.081; iron 77 tibc 334 03-18-24: glucose 89; bun 23 creat 0.67; k+ 3.8; na++ 138; ca 9.3 gfr >60; protein 6.5 albumin 3.6; sed rate 10; vitamin B12: 1054; RA factor: <10; ANA neg   NO NEW LABS    Review of Systems  Constitutional:  Negative for malaise/fatigue.  Respiratory:  Negative for cough and shortness of breath.   Cardiovascular:  Negative for chest pain, palpitations and leg swelling.  Gastrointestinal:  Negative for abdominal pain, constipation and heartburn.  Musculoskeletal:  Negative for back pain, joint pain and myalgias.  Skin: Negative.   Neurological:  Positive for tremors. Negative for dizziness.  Psychiatric/Behavioral:  The patient is not  nervous/anxious.    Physical Exam Constitutional:      General: She is not in acute distress.    Appearance: She is well-developed. She is obese. She is not diaphoretic.  Neck:     Thyroid : No thyromegaly.  Cardiovascular:     Rate and Rhythm: Normal rate and regular rhythm.     Heart sounds: Normal heart sounds.  Pulmonary:     Effort: Pulmonary effort is normal. No respiratory distress.     Breath sounds: Normal breath sounds.  Abdominal:     General: Bowel sounds are normal. There is no distension.     Palpations: Abdomen is soft.     Tenderness: There is no abdominal tenderness.  Musculoskeletal:        General: Normal range of motion.     Cervical back: Neck supple.     Right lower leg: No edema.     Left lower leg: No edema.  Lymphadenopathy:     Cervical: No cervical adenopathy.  Skin:    General: Skin is warm and dry.  Neurological:     Mental Status: She is alert. Mental status is at baseline.     Comments: Has some abnormal facial movements and tremors         10-14-22: SLUMS 18/30    Psychiatric:        Mood and Affect: Mood normal.          ASSESSMENT/ PLAN:  TODAY  Chronic anemia: hgb 11.9; iron studies have been normal   2. Psychosis in elderly/major depression with psychotic features: will continue abilify  5 mg daily; cymbalta  60 mg daily and buspar  20 mg twice daily   3. Protein calorie malnutrition: is presently not on supplements.   PREVIOUS  4. Vitamin B 12: deficiency: level is 1054; will continue  1,000 mcg daily   5. Urinary incontinence: unable to tolerate myrbetriq   6. Longstanding persistent atrial fibrillation/history of DVT: heart rate is stable will continue eliquis  5 mg twice daily   7. Migraine without status migrainosus, non intractable unspecified; type: has prn imitrex   8. Uncomplicated asthma unspecified asthma severity unspecified, unspecified whether persistent: is presently off medications will monitor  9. Chronic  constipation: will continue senna s twice daily    10. Vascular dementia without behavioral disturbance/neurocognitive deficit: weight is 163 pounds; is on aricept 5 mg nightly   11. Chronic pain syndrome: is on cymbalta  60 mg daily and tylenol  650 mg nightly   12. Bilateral lower extremity edema: will continue lasix  20 mg daily   13. Tardive dyskinesia: is having increased movements and tremors; will continue tetrabenzine  37.5 mg twice daily and inderal 10 mg twice daily will monitor her status. Failed both ingrezza  and austeda   14. Somnolence: did not tolerate every other day dosing will continue provigil  100 mg daily  15.  Chronic insomnia: did not tolerate every other night dosing will continue lunesta  1 mg nightly      Barnie Seip NP [facility]   call [phone]

## 2024-07-14 ENCOUNTER — Other Ambulatory Visit: Payer: Self-pay | Admitting: Adult Health

## 2024-07-14 MED ORDER — ESZOPICLONE 1 MG PO TABS: 1.0000 mg | ORAL_TABLET | Freq: Every day | ORAL | 0 refills | Status: DC

## 2024-07-19 ENCOUNTER — Non-Acute Institutional Stay (SKILLED_NURSING_FACILITY): Payer: Self-pay | Admitting: Family Medicine

## 2024-07-19 DIAGNOSIS — M79675 Pain in left toe(s): Secondary | ICD-10-CM | POA: Diagnosis not present

## 2024-07-19 NOTE — Progress Notes (Signed)
 Location:  [facility]   Place of Service:      CODE STATUS: FULL  Allergies  Allergen Reactions   Ticlid [Ticlopidine] Shortness Of Breath   Nsaids Other (See Comments)    Bleeding ulcer   Prednisone Other (See Comments)    Makes her stomach hurt--knows this isn't an allergy but doesn't want to take it    Trazodone And Nefazodone      hjallucinations   Monistat [Miconazole] Swelling and Rash   Myrbetriq  [Mirabegron  Er] Rash    See 02/27/2023   Sulfa Antibiotics Rash    No chief complaint on file.   HPI: No BM documented since 07/12/24, pt reports she had one two days ago No concerns today other than L 5th toe pain   Past Medical History:  Diagnosis Date   Anemia    Arthritis    Asthma    Atrial fibrillation (HCC)    COVID-19 08/05/2022   Depression    Elevated C-reactive protein (CRP) 06/27/2023   H/O head injury    Hearing loss    Heart murmur    can be heard at times, Doctor said not to worry   History of kidney stones    History of stomach ulcers    Migraine    Pneumonia    Psittacosis    Stroke (HCC)    Weakness of neck 03/02/2013    Past Surgical History:  Procedure Laterality Date   ANKLE FRACTURE SURGERY Left    APPENDECTOMY     BREAST LUMPECTOMY     CESAREAN SECTION     x 3   I & D KNEE WITH POLY EXCHANGE Left 08/16/2022   Procedure: KNEE POLY EXCHANGE;  Surgeon: Cristy Bonner DASEN, MD;  Location: [facility] OR;  Service: Orthopedics;  Laterality: Left;   KNEE ARTHROSCOPY WITH PATELLAR TENDON REPAIR Left 08/16/2022   Procedure: IRRIGATION AND DEBRIDEMENT LEFT KNEE REVISION WITH PATELLAR TENDON REPAIR and poly exchange;  Surgeon: Cristy Bonner DASEN, MD;  Location: [facility] OR;  Service: Orthopedics;  Laterality: Left;   PARTIAL HYSTERECTOMY     ROTATOR CUFF REPAIR Right    TOTAL KNEE ARTHROPLASTY Left 07/22/2022   Procedure: TOTAL KNEE ARTHROPLASTY;  Surgeon: Edna Toribio LABOR, MD;  Location: [facility];  Service: Orthopedics;  Laterality: Left;    Social  History   Socioeconomic History   Marital status: Married    Spouse name: Not on file   Number of children: 3   Years of education: Not on file   Highest education level: Not on file  Occupational History   Occupation: retired  Tobacco Use   Smoking status: Never   Smokeless tobacco: Never  Vaping Use   Vaping status: Never Used  Substance and Sexual Activity   Alcohol  use: Not Currently    Comment: occ beer   Drug use: No   Sexual activity: Yes    Birth control/protection: Surgical    Comment: hyst  Other Topics Concern   Not on file  Social History Narrative   Not on file   Social Drivers of Health   Financial Resource Strain: Medium Risk (01/30/2021)   Overall Financial Resource Strain (CARDIA)    Difficulty of Paying Living Expenses: Somewhat hard  Food Insecurity: No Food Insecurity (08/15/2022)   Hunger Vital Sign    Worried About Running Out of Food in the Last Year: Never true    Ran Out of Food in the Last Year: Never true  Transportation Needs: No Transportation Needs (08/15/2022)  PRAPARE - Administrator, Civil Service (Medical): No    Lack of Transportation (Non-Medical): No  Physical Activity: Insufficiently Active (01/30/2021)   Exercise Vital Sign    Days of Exercise per Week: 2 days    Minutes of Exercise per Session: 10 min  Stress: No Stress Concern Present (01/30/2021)   [employer] of Occupational Health - Occupational Stress Questionnaire    Feeling of Stress : Only a little  Social Connections: Moderately Integrated (01/30/2021)   Social Connection and Isolation Panel    Frequency of Communication with Friends and Family: Twice a week    Frequency of Social Gatherings with Friends and Family: Twice a week    Attends Religious Services: More than 4 times per year    Active Member of [employer] or Organizations: No    Attends Banker Meetings: Never    Marital Status: Married  Catering manager Violence: Not At Risk  (08/15/2022)   Humiliation, Afraid, Rape, and Kick questionnaire    Fear of Current or Ex-Partner: No    Emotionally Abused: No    Physically Abused: No    Sexually Abused: No   Family History  Problem Relation Age of Onset   Cancer Father    Heart disease Brother    Alcohol  abuse Brother    Depression Maternal Grandmother    Depression Grandchild    Breast cancer Daughter       VITAL SIGNS There were no vitals taken for this visit.  Outpatient Encounter Medications as of 07/19/2024  Medication Sig   acetaminophen  (TYLENOL ) 650 MG CR tablet Take 650 mg by mouth every 8 (eight) hours.   apixaban  (ELIQUIS ) 5 MG TABS tablet Take 1 tablet (5 mg total) by mouth 2 (two) times daily.   ARIPiprazole  (ABILIFY ) 5 MG tablet Take 5 mg by mouth daily.   busPIRone  (BUSPAR ) 10 MG tablet Take 10 mg by mouth 3 (three) times daily.   chlorhexidine  (HIBICLENS ) 4 % external liquid Apply topically every other day. topical, Once A Day Every Other Day, Clean between all toes to both feet daily with Hibiclens  and soft gauze every other day   cyanocobalamin  1000 MCG tablet Take 1,000 mcg by mouth daily.   donepezil (ARICEPT) 5 MG tablet Take 5 mg by mouth at bedtime.   DULoxetine  (CYMBALTA ) 60 MG capsule Take 60 mg by mouth daily.   eszopiclone  (LUNESTA ) 1 MG TABS tablet Take 1 tablet (1 mg total) by mouth at bedtime. Take immediately before bedtime   furosemide  (LASIX ) 20 MG tablet Take 20 mg by mouth.   isosorbide dinitrate (ISORDIL) 10 MG tablet Take 10 mg by mouth 2 (two) times daily.   loratadine (CLARITIN) 10 MG tablet Take 10 mg by mouth daily as needed for allergies.   modafinil  (PROVIGIL ) 100 MG tablet Take 1 tablet (100 mg total) by mouth daily.   Polyethyl Glycol-Propyl Glycol (SYSTANE) 0.4-0.3 % SOLN Apply to eye 2 (two) times daily as needed.   rosuvastatin (CRESTOR) 5 MG tablet Take 5 mg by mouth at bedtime.   senna-docusate (SENOKOT-S) 8.6-50 MG tablet Take 1 tablet by mouth once as needed  for mild constipation.   SUMAtriptan  (IMITREX ) 100 MG tablet Take 100 mg by mouth every 2 (two) hours as needed for migraine or headache. May repeat in 2 hours if headache persists or recurs.   tetrabenazine (XENAZINE) 25 MG tablet Take 37.5 mg by mouth 2 (two) times daily.   UNABLE TO FIND Diet: Regular  No facility-administered encounter medications on file as of 07/19/2024.     SIGNIFICANT DIAGNOSTIC EXAMS CV: RRR, no murmurs Pulm: Breathing comfortably on RA Abd: Soft, non-distended MSK: L fifth toe no sign of wound or skin breakdown, diffusely tender. Erythema to bunion below L great toe.    ASSESSMENT/ PLAN: Left 5th toe pain Likely 2/2 toes rubbing together. No wounds or signs of skin breakdown - Recommend toe spacer for comfort  Constipation LBM 2 days ago - Consider scheduling miralax  and senna  B12 deficiency - Continue B12 1000mcg daily - Consider decreasing dose based off last B12 check, or rechecking level  Atrial fibrillation, stroke - Continue Eliquis  5mg  BID  MDD - Continue Abilify  5mg  daily - Continue buspar  10mg  TID - Continue Cymbalta  60mg  daily  Vascular dementia - Continue Aricept 5mg  daily  Hyperlipidemia  - Continue Crestor 5mg  nightly  Insomnia - Continue Lunesta  1mg  daily at bedtime  Leg swelling Last echo 2023, LVEF 55-60 but unable to assess regional wall motion/diastolic function. - Continue Furosemide  10mg  daily - Continue isosorbide dinitrate 10mg  BID - consider med reconcilitaiton  Daytime somnolence - continue modafinil  100mg  daily  Headache - continue Imitrex  100mg  q2h PRN  Movement disorder - Continue Tetrabenazine 37.5mg  BID

## 2024-07-27 DIAGNOSIS — G2401 Drug induced subacute dyskinesia: Secondary | ICD-10-CM | POA: Diagnosis not present

## 2024-07-27 DIAGNOSIS — F5105 Insomnia due to other mental disorder: Secondary | ICD-10-CM | POA: Diagnosis not present

## 2024-07-27 DIAGNOSIS — F332 Major depressive disorder, recurrent severe without psychotic features: Secondary | ICD-10-CM | POA: Diagnosis not present

## 2024-07-27 DIAGNOSIS — F411 Generalized anxiety disorder: Secondary | ICD-10-CM | POA: Diagnosis not present

## 2024-08-03 NOTE — Addendum Note (Signed)
 Addended by: [name] on: 08/03/2024 02:26 PM   Modules accepted: Level of Service

## 2024-08-09 ENCOUNTER — Other Ambulatory Visit: Payer: Self-pay | Admitting: Adult Health

## 2024-08-09 MED ORDER — MODAFINIL 100 MG PO TABS: 100.0000 mg | ORAL_TABLET | Freq: Every day | ORAL | 0 refills | Status: DC

## 2024-08-09 MED ORDER — ESZOPICLONE 1 MG PO TABS: 1.0000 mg | ORAL_TABLET | Freq: Every day | ORAL | 0 refills | Status: DC

## 2024-08-12 ENCOUNTER — Non-Acute Institutional Stay (SKILLED_NURSING_FACILITY): Payer: Self-pay | Admitting: Adult Health

## 2024-08-12 ENCOUNTER — Encounter: Payer: Self-pay | Admitting: Adult Health

## 2024-08-12 DIAGNOSIS — N3941 Urge incontinence: Secondary | ICD-10-CM | POA: Diagnosis not present

## 2024-08-12 DIAGNOSIS — I480 Paroxysmal atrial fibrillation: Secondary | ICD-10-CM | POA: Diagnosis not present

## 2024-08-12 DIAGNOSIS — E538 Deficiency of other specified B group vitamins: Secondary | ICD-10-CM

## 2024-08-12 NOTE — Progress Notes (Signed)
 Location:  [facility] Room Number: 101-W Place of Service:  SNF (31) Provider: Barnie Seip, NP  CODE STATUS: FULL CODE  Allergies  Allergen Reactions   Ticlid [Ticlopidine] Shortness Of Breath   Nsaids Other (See Comments)    Bleeding ulcer   Prednisone Other (See Comments)    Makes her stomach hurt--knows this isn't an allergy but doesn't want to take it    Trazodone And Nefazodone      hjallucinations   Monistat [Miconazole] Swelling and Rash   Myrbetriq  [Mirabegron  Er] Rash    See 02/27/2023   Sulfa Antibiotics Rash    Chief Complaint  Patient presents with   Medical Management of Chronic Issues               Vitamin B 12 deficiency: Urinary incontinence:  Long standing persistent atrial fibrillation/history of DVT:    HPI:  She is a 78 y.o. long term resident of this facility being seen for the management of her chronic illnesses: Vitamin B 12 deficiency: Urinary incontinence:  Long standing persistent atrial fibrillation/history of DVT. She continues with tremors despite being treated; they are slightly less intense. There are no reports of palpitations or shortness of breath.    Past Medical History:  Diagnosis Date   Anemia    Arthritis    Asthma    Atrial fibrillation (HCC)    COVID-19 08/05/2022   Depression    Elevated C-reactive protein (CRP) 06/27/2023   H/O head injury    Hearing loss    Heart murmur    can be heard at times, Doctor said not to worry   History of kidney stones    History of stomach ulcers    Migraine    Pneumonia    Psittacosis    Stroke (HCC)    Weakness of neck 03/02/2013    Past Surgical History:  Procedure Laterality Date   ANKLE FRACTURE SURGERY Left    APPENDECTOMY     BREAST LUMPECTOMY     CESAREAN SECTION     x 3   I & D KNEE WITH POLY EXCHANGE Left 08/16/2022   Procedure: KNEE POLY EXCHANGE;  Surgeon: Cristy Bonner DASEN, MD;  Location: [facility] OR;  Service: Orthopedics;  Laterality: Left;   KNEE  ARTHROSCOPY WITH PATELLAR TENDON REPAIR Left 08/16/2022   Procedure: IRRIGATION AND DEBRIDEMENT LEFT KNEE REVISION WITH PATELLAR TENDON REPAIR and poly exchange;  Surgeon: Cristy Bonner DASEN, MD;  Location: [facility] OR;  Service: Orthopedics;  Laterality: Left;   PARTIAL HYSTERECTOMY     ROTATOR CUFF REPAIR Right    TOTAL KNEE ARTHROPLASTY Left 07/22/2022   Procedure: TOTAL KNEE ARTHROPLASTY;  Surgeon: Edna Toribio LABOR, MD;  Location: [facility];  Service: Orthopedics;  Laterality: Left;    Social History   Socioeconomic History   Marital status: Married    Spouse name: Not on file   Number of children: 3   Years of education: Not on file   Highest education level: Not on file  Occupational History   Occupation: retired  Tobacco Use   Smoking status: Never   Smokeless tobacco: Never  Vaping Use   Vaping status: Never Used  Substance and Sexual Activity   Alcohol  use: Not Currently    Comment: occ beer   Drug use: No   Sexual activity: Yes    Birth control/protection: Surgical    Comment: hyst  Other Topics Concern   Not on file  Social History Narrative   Not  on file   Social Drivers of Health   Financial Resource Strain: Medium Risk (01/30/2021)   Overall Financial Resource Strain (CARDIA)    Difficulty of Paying Living Expenses: Somewhat hard  Food Insecurity: No Food Insecurity (08/15/2022)   Hunger Vital Sign    Worried About Running Out of Food in the Last Year: Never true    Ran Out of Food in the Last Year: Never true  Transportation Needs: No Transportation Needs (08/15/2022)   PRAPARE - Administrator, Civil Service (Medical): No    Lack of Transportation (Non-Medical): No  Physical Activity: Insufficiently Active (01/30/2021)   Exercise Vital Sign    Days of Exercise per Week: 2 days    Minutes of Exercise per Session: 10 min  Stress: No Stress Concern Present (01/30/2021)   [employer] of Occupational Health - Occupational Stress Questionnaire     Feeling of Stress : Only a little  Social Connections: Moderately Integrated (01/30/2021)   Social Connection and Isolation Panel    Frequency of Communication with Friends and Family: Twice a week    Frequency of Social Gatherings with Friends and Family: Twice a week    Attends Religious Services: More than 4 times per year    Active Member of [employer] or Organizations: No    Attends Banker Meetings: Never    Marital Status: Married  Catering manager Violence: Not At Risk (08/15/2022)   Humiliation, Afraid, Rape, and Kick questionnaire    Fear of Current or Ex-Partner: No    Emotionally Abused: No    Physically Abused: No    Sexually Abused: No   Family History  Problem Relation Age of Onset   Cancer Father    Heart disease Brother    Alcohol  abuse Brother    Depression Maternal Grandmother    Depression Grandchild    Breast cancer Daughter       VITAL SIGNS BP 104/65   Pulse 74   Temp (!) 97 F (36.1 C)   Resp 20   Ht 5' (1.524 m)   Wt 162 lb 12.8 oz (73.8 kg)   SpO2 98%   BMI 31.79 kg/m   Outpatient Encounter Medications as of 08/12/2024  Medication Sig   acetaminophen  (TYLENOL ) 650 MG CR tablet Take 650 mg by mouth every 8 (eight) hours.   apixaban  (ELIQUIS ) 5 MG TABS tablet Take 1 tablet (5 mg total) by mouth 2 (two) times daily.   ARIPiprazole  (ABILIFY ) 5 MG tablet Take 5 mg by mouth daily.   busPIRone  (BUSPAR ) 10 MG tablet Take 10 mg by mouth 3 (three) times daily.   chlorhexidine  (HIBICLENS ) 4 % external liquid Apply topically every other day. topical, Once A Day Every Other Day, Clean between all toes to both feet daily with Hibiclens  and soft gauze every other day   cyanocobalamin  1000 MCG tablet Take 1,000 mcg by mouth daily.   donepezil (ARICEPT) 5 MG tablet Take 5 mg by mouth at bedtime.   DULoxetine  (CYMBALTA ) 60 MG capsule Take 60 mg by mouth daily.   eszopiclone  (LUNESTA ) 1 MG TABS tablet Take 1 tablet (1 mg total) by mouth at bedtime. Take  immediately before bedtime   furosemide  (LASIX ) 20 MG tablet Take 20 mg by mouth.   isosorbide dinitrate (ISORDIL) 10 MG tablet Take 10 mg by mouth 2 (two) times daily.   loratadine (CLARITIN) 10 MG tablet Take 10 mg by mouth daily as needed for allergies.   modafinil  (PROVIGIL ) 100  MG tablet Take 1 tablet (100 mg total) by mouth daily.   Polyethyl Glycol-Propyl Glycol (SYSTANE) 0.4-0.3 % SOLN Apply to eye 2 (two) times daily as needed.   rosuvastatin (CRESTOR) 5 MG tablet Take 5 mg by mouth at bedtime.   senna-docusate (SENOKOT-S) 8.6-50 MG tablet Take 1 tablet by mouth once as needed for mild constipation.   SUMAtriptan  (IMITREX ) 100 MG tablet Take 100 mg by mouth every 2 (two) hours as needed for migraine or headache. May repeat in 2 hours if headache persists or recurs.   tetrabenazine (XENAZINE) 25 MG tablet Take 37.5 mg by mouth 2 (two) times daily.   UNABLE TO FIND Diet: Regular   No facility-administered encounter medications on file as of 08/12/2024.     SIGNIFICANT DIAGNOSTIC EXAMS  LABS REVIEWED PREVIOUS   10-16-23: chol 136; ldl 66; trig 70 hdl 56 11-06-23: glucose 154; bun 27; creat 0.80; k+ 3.6; na++ 139; ca 9.3 gfr >60   12-11-23: wbc 5.0; hgb 11.9; hct 36.7; mcv 99.5 plt 181; hgb A1c 5.3; tsh 2.295 01-12-24: vitamin B12: 225; folate 10.7; tsh 2.081; iron 77 tibc 334 03-18-24: glucose 89; bun 23 creat 0.67; k+ 3.8; na++ 138; ca 9.3 gfr >60; protein 6.5 albumin 3.6; sed rate 10; vitamin B12: 1054; RA factor: <10; ANA neg   NO NEW LABS    Review of Systems  Constitutional:  Negative for malaise/fatigue.  Respiratory:  Negative for cough and shortness of breath.   Cardiovascular:  Negative for chest pain, palpitations and leg swelling.  Gastrointestinal:  Negative for abdominal pain, constipation and heartburn.  Musculoskeletal:  Negative for back pain, joint pain and myalgias.  Skin: Negative.   Neurological:  Positive for tremors. Negative for dizziness.   Psychiatric/Behavioral:  The patient is not nervous/anxious.     Physical Exam Constitutional:      General: She is not in acute distress.    Appearance: She is well-developed. She is obese. She is not diaphoretic.  Neck:     Thyroid : No thyromegaly.  Cardiovascular:     Rate and Rhythm: Normal rate and regular rhythm.     Heart sounds: Normal heart sounds.  Pulmonary:     Effort: Pulmonary effort is normal. No respiratory distress.     Breath sounds: Normal breath sounds.  Abdominal:     General: Bowel sounds are normal. There is no distension.     Palpations: Abdomen is soft.     Tenderness: There is no abdominal tenderness.  Musculoskeletal:        General: Normal range of motion.     Cervical back: Neck supple.     Right lower leg: No edema.     Left lower leg: No edema.  Lymphadenopathy:     Cervical: No cervical adenopathy.  Skin:    General: Skin is warm and dry.  Neurological:     Mental Status: She is alert. Mental status is at baseline.     Comments: Has some abnormal facial movements and tremors         10-14-22: SLUMS 18/30    Psychiatric:        Mood and Affect: Mood normal.          ASSESSMENT/ PLAN:  TODAY  Vitamin B 12 deficiency: level 1054; will continue supplement 1000 mcg daily   2. Urinary incontinence: is without change; was unable to tolerate myrbetriq   3. Long standing persistent atrial fibrillation/history of DVT: heart rate remains stable will continue eliquis  5 mg  twice daily   PREVIOUS   4. Migraine without status migrainosus, non intractable unspecified; type: has prn imitrex   5. Uncomplicated asthma unspecified asthma severity unspecified, unspecified whether persistent: is presently off medications will monitor  6. Chronic constipation: will continue senna s twice daily    7. Vascular dementia without behavioral disturbance/neurocognitive deficit: weight is 162 pounds; is on aricept 5 mg nightly   8. Chronic pain syndrome: is  on cymbalta  60 mg daily and tylenol  650 mg nightly   9. Bilateral lower extremity edema: will continue lasix  20 mg daily   10. Tardive dyskinesia: is having increased movements and tremors; will continue tetrabenzine  37.5 mg twice daily and inderal 10 mg twice daily will monitor her status. Failed both ingrezza  and austeda   11. Somnolence: did not tolerate every other day dosing will continue provigil  100 mg daily  12.  Chronic insomnia: did not tolerate every other night dosing will continue lunesta  1 mg nightly   13. Chronic anemia: hgb 11.9; iron studies have been normal   14. Psychosis in elderly/major depression with psychotic features: will continue abilify  5 mg daily; cymbalta  60 mg daily and buspar  20 mg twice daily   15. Protein calorie malnutrition: is presently not on supplements.     Barnie Seip NP [facility]   call [phone]

## 2024-08-16 DIAGNOSIS — L602 Onychogryphosis: Secondary | ICD-10-CM | POA: Diagnosis not present

## 2024-08-16 DIAGNOSIS — L603 Nail dystrophy: Secondary | ICD-10-CM | POA: Diagnosis not present

## 2024-08-17 ENCOUNTER — Other Ambulatory Visit (HOSPITAL_COMMUNITY): Payer: Self-pay | Admitting: Adult Health

## 2024-08-17 DIAGNOSIS — Z1231 Encounter for screening mammogram for malignant neoplasm of breast: Secondary | ICD-10-CM

## 2024-08-31 DIAGNOSIS — F332 Major depressive disorder, recurrent severe without psychotic features: Secondary | ICD-10-CM | POA: Diagnosis not present

## 2024-08-31 DIAGNOSIS — F411 Generalized anxiety disorder: Secondary | ICD-10-CM | POA: Diagnosis not present

## 2024-08-31 DIAGNOSIS — F5105 Insomnia due to other mental disorder: Secondary | ICD-10-CM | POA: Diagnosis not present

## 2024-09-01 ENCOUNTER — Non-Acute Institutional Stay (SKILLED_NURSING_FACILITY): Payer: Self-pay | Admitting: Adult Health

## 2024-09-01 ENCOUNTER — Encounter: Payer: Self-pay | Admitting: Adult Health

## 2024-09-01 DIAGNOSIS — M19042 Primary osteoarthritis, left hand: Secondary | ICD-10-CM

## 2024-09-01 DIAGNOSIS — G2401 Drug induced subacute dyskinesia: Secondary | ICD-10-CM

## 2024-09-01 DIAGNOSIS — M19041 Primary osteoarthritis, right hand: Secondary | ICD-10-CM

## 2024-09-01 NOTE — Progress Notes (Signed)
 Location:  [facility] Room Number: 74 W Place of Service:  SNF (31)   CODE STATUS: Full Code   Allergies  Allergen Reactions   Ticlid [Ticlopidine] Shortness Of Breath   Nsaids Other (See Comments)    Bleeding ulcer   Prednisone Other (See Comments)    Makes her stomach hurt--knows this isn't an allergy but doesn't want to take it    Trazodone And Nefazodone      hjallucinations   Monistat [Miconazole] Swelling and Rash   Myrbetriq  [Mirabegron  Er] Rash    See 02/27/2023   Sulfa Antibiotics Rash    Chief Complaint  Patient presents with   Pain Management    HPI:  She is complaining of bilateral hand pain due to arthritis. She has arthritic changes to both hands. There is no swelling present. She does have significant bilateral hand tremors. She tells me that the tremors are no better; making it difficult for her to participate with her adl care.   Past Medical History:  Diagnosis Date   Anemia    Arthritis    Asthma    Atrial fibrillation (HCC)    COVID-19 08/05/2022   Depression    Elevated C-reactive protein (CRP) 06/27/2023   H/O head injury    Hearing loss    Heart murmur    can be heard at times, Doctor said not to worry   History of kidney stones    History of stomach ulcers    Migraine    Pneumonia    Psittacosis    Stroke (HCC)    Weakness of neck 03/02/2013    Past Surgical History:  Procedure Laterality Date   ANKLE FRACTURE SURGERY Left    APPENDECTOMY     BREAST LUMPECTOMY     CESAREAN SECTION     x 3   I & D KNEE WITH POLY EXCHANGE Left 08/16/2022   Procedure: KNEE POLY EXCHANGE;  Surgeon: Cristy Bonner DASEN, MD;  Location: [facility] OR;  Service: Orthopedics;  Laterality: Left;   KNEE ARTHROSCOPY WITH PATELLAR TENDON REPAIR Left 08/16/2022   Procedure: IRRIGATION AND DEBRIDEMENT LEFT KNEE REVISION WITH PATELLAR TENDON REPAIR and poly exchange;  Surgeon: Cristy Bonner DASEN, MD;  Location: [facility] OR;  Service: Orthopedics;  Laterality: Left;    PARTIAL HYSTERECTOMY     ROTATOR CUFF REPAIR Right    TOTAL KNEE ARTHROPLASTY Left 07/22/2022   Procedure: TOTAL KNEE ARTHROPLASTY;  Surgeon: Edna Toribio LABOR, MD;  Location: [facility];  Service: Orthopedics;  Laterality: Left;    Social History   Socioeconomic History   Marital status: Married    Spouse name: Not on file   Number of children: 3   Years of education: Not on file   Highest education level: Not on file  Occupational History   Occupation: retired  Tobacco Use   Smoking status: Never   Smokeless tobacco: Never  Vaping Use   Vaping status: Never Used  Substance and Sexual Activity   Alcohol  use: Not Currently    Comment: occ beer   Drug use: No   Sexual activity: Yes    Birth control/protection: Surgical    Comment: hyst  Other Topics Concern   Not on file  Social History Narrative   Not on file   Social Drivers of Health   Financial Resource Strain: Medium Risk (01/30/2021)   Overall Financial Resource Strain (CARDIA)    Difficulty of Paying Living Expenses: Somewhat hard  Food Insecurity: No Food Insecurity (08/15/2022)  Hunger Vital Sign    Worried About Running Out of Food in the Last Year: Never true    Ran Out of Food in the Last Year: Never true  Transportation Needs: No Transportation Needs (08/15/2022)   PRAPARE - Administrator, Civil Service (Medical): No    Lack of Transportation (Non-Medical): No  Physical Activity: Insufficiently Active (01/30/2021)   Exercise Vital Sign    Days of Exercise per Week: 2 days    Minutes of Exercise per Session: 10 min  Stress: No Stress Concern Present (01/30/2021)   [employer] of Occupational Health - Occupational Stress Questionnaire    Feeling of Stress : Only a little  Social Connections: Moderately Integrated (01/30/2021)   Social Connection and Isolation Panel    Frequency of Communication with Friends and Family: Twice a week    Frequency of Social Gatherings with Friends and  Family: Twice a week    Attends Religious Services: More than 4 times per year    Active Member of [employer] or Organizations: No    Attends Banker Meetings: Never    Marital Status: Married  Catering Manager Violence: Not At Risk (08/15/2022)   Humiliation, Afraid, Rape, and Kick questionnaire    Fear of Current or Ex-Partner: No    Emotionally Abused: No    Physically Abused: No    Sexually Abused: No   Family History  Problem Relation Age of Onset   Cancer Father    Heart disease Brother    Alcohol  abuse Brother    Depression Maternal Grandmother    Depression Grandchild    Breast cancer Daughter       VITAL SIGNS BP 124/62   Pulse 68   Temp 98.1 F (36.7 C)   Resp 18   Ht 5' (1.524 m)   Wt 162 lb 12.8 oz (73.8 kg)   SpO2 95%   BMI 31.79 kg/m   Outpatient Encounter Medications as of 09/01/2024  Medication Sig   acetaminophen  (TYLENOL ) 650 MG CR tablet Take 650 mg by mouth every 8 (eight) hours.   apixaban  (ELIQUIS ) 5 MG TABS tablet Take 1 tablet (5 mg total) by mouth 2 (two) times daily.   ARIPiprazole  (ABILIFY ) 5 MG tablet Take 5 mg by mouth daily.   busPIRone  (BUSPAR ) 10 MG tablet Take 10 mg by mouth 3 (three) times daily.   chlorhexidine  (HIBICLENS ) 4 % external liquid Apply topically every other day. topical, Once A Day Every Other Day, Clean between all toes to both feet daily with Hibiclens  and soft gauze every other day   cyanocobalamin  1000 MCG tablet Take 1,000 mcg by mouth daily.   DULoxetine  (CYMBALTA ) 60 MG capsule Take 40 mg by mouth daily.   eszopiclone  (LUNESTA ) 1 MG TABS tablet Take 1 tablet (1 mg total) by mouth at bedtime. Take immediately before bedtime   furosemide  (LASIX ) 20 MG tablet Take 20 mg by mouth.   isosorbide dinitrate (ISORDIL) 10 MG tablet Take 10 mg by mouth 2 (two) times daily.   modafinil  (PROVIGIL ) 100 MG tablet Take 1 tablet (100 mg total) by mouth daily.   Polyethyl Glycol-Propyl Glycol (SYSTANE) 0.4-0.3 % SOLN Apply  to eye 2 (two) times daily as needed.   rosuvastatin (CRESTOR) 5 MG tablet Take 5 mg by mouth at bedtime.   senna-docusate (SENOKOT-S) 8.6-50 MG tablet Take 1 tablet by mouth once as needed for mild constipation.   SUMAtriptan  (IMITREX ) 100 MG tablet Take 100 mg by mouth every  2 (two) hours as needed for migraine or headache. May repeat in 2 hours if headache persists or recurs.   tetrabenazine (XENAZINE) 25 MG tablet Take 37.5 mg by mouth 2 (two) times daily.   UNABLE TO FIND Diet: Regular   donepezil (ARICEPT) 5 MG tablet Take 5 mg by mouth at bedtime. (Patient not taking: Reported on 09/01/2024)   loratadine (CLARITIN) 10 MG tablet Take 10 mg by mouth daily as needed for allergies. (Patient not taking: Reported on 09/01/2024)   No facility-administered encounter medications on file as of 09/01/2024.     SIGNIFICANT DIAGNOSTIC EXAMS  LABS REVIEWED PREVIOUS   10-16-23: chol 136; ldl 66; trig 70 hdl 56 11-06-23: glucose 154; bun 27; creat 0.80; k+ 3.6; na++ 139; ca 9.3 gfr >60   12-11-23: wbc 5.0; hgb 11.9; hct 36.7; mcv 99.5 plt 181; hgb A1c 5.3; tsh 2.295 01-12-24: vitamin B12: 225; folate 10.7; tsh 2.081; iron 77 tibc 334 03-18-24: glucose 89; bun 23 creat 0.67; k+ 3.8; na++ 138; ca 9.3 gfr >60; protein 6.5 albumin 3.6; sed rate 10; vitamin B12: 1054; RA factor: <10; ANA neg   NO NEW LABS    Review of Systems  Constitutional:  Negative for malaise/fatigue.  Respiratory:  Negative for cough and shortness of breath.   Cardiovascular:  Negative for chest pain, palpitations and leg swelling.  Gastrointestinal:  Negative for abdominal pain, constipation and heartburn.  Musculoskeletal:  Positive for joint pain. Negative for back pain and myalgias.  Skin: Negative.   Neurological:  Positive for tremors. Negative for dizziness.  Psychiatric/Behavioral:  The patient is not nervous/anxious.     Physical Exam Constitutional:      General: She is not in acute distress.    Appearance: She is  well-developed and overweight. She is not diaphoretic.  Neck:     Thyroid : No thyromegaly.  Cardiovascular:     Rate and Rhythm: Normal rate and regular rhythm.     Heart sounds: Normal heart sounds.  Pulmonary:     Effort: Pulmonary effort is normal. No respiratory distress.     Breath sounds: Normal breath sounds.  Abdominal:     General: Bowel sounds are normal. There is no distension.     Palpations: Abdomen is soft.     Tenderness: There is no abdominal tenderness.  Musculoskeletal:        General: Normal range of motion.     Cervical back: Neck supple.     Right lower leg: No edema.     Left lower leg: No edema.     Comments: Has bilateral arthritic changes to both hands   Lymphadenopathy:     Cervical: No cervical adenopathy.  Skin:    General: Skin is warm and dry.  Neurological:     Mental Status: She is alert. Mental status is at baseline.     Comments: Has bilateral hand tremors   Psychiatric:        Mood and Affect: Mood normal.     ASSESSMENT/ PLAN:  TODAY  Tardive dyskinesia Primary osteoarthritis bilateral hands  Will increase tetrabenazine to 50 mg twice daily  Will begin voltaren gel 2 gm to both hands twice daily    Barnie Seip NP [facility]  call [phone]

## 2024-09-02 ENCOUNTER — Ambulatory Visit (HOSPITAL_COMMUNITY)
Admission: RE | Admit: 2024-09-02 | Discharge: 2024-09-02 | Disposition: A | Discharge status: Home / Self Care | Source: Ambulatory Visit | Attending: Adult Health | Admitting: Adult Health

## 2024-09-02 DIAGNOSIS — Z1231 Encounter for screening mammogram for malignant neoplasm of breast: Secondary | ICD-10-CM | POA: Insufficient documentation

## 2024-09-07 ENCOUNTER — Ambulatory Visit: Payer: Self-pay | Admitting: Adult Health

## 2024-09-08 ENCOUNTER — Other Ambulatory Visit: Payer: Self-pay | Admitting: Adult Health

## 2024-09-08 MED ORDER — MODAFINIL 100 MG PO TABS: 100.0000 mg | ORAL_TABLET | Freq: Every day | ORAL | 0 refills | Status: DC

## 2024-09-08 MED ORDER — ESZOPICLONE 1 MG PO TABS: 1.0000 mg | ORAL_TABLET | Freq: Every day | ORAL | 0 refills | Status: DC

## 2024-09-09 ENCOUNTER — Encounter: Payer: Self-pay | Admitting: Adult Health

## 2024-09-13 ENCOUNTER — Encounter: Payer: Self-pay | Admitting: Adult Health

## 2024-09-13 ENCOUNTER — Non-Acute Institutional Stay (SKILLED_NURSING_FACILITY): Payer: Self-pay | Admitting: Adult Health

## 2024-09-13 DIAGNOSIS — F0150 Vascular dementia without behavioral disturbance: Secondary | ICD-10-CM

## 2024-09-13 NOTE — Progress Notes (Unsigned)
 Location:  [facility]   Place of Service:  SNF    CODE STATUS: Full Code  Allergies  Allergen Reactions   Ticlid [Ticlopidine] Shortness Of Breath   Nsaids Other (See Comments)    Bleeding ulcer   Prednisone Other (See Comments)    Makes her stomach hurt--knows this isn't an allergy but doesn't want to take it    Trazodone And Nefazodone      hjallucinations   Monistat [Miconazole] Swelling and Rash   Myrbetriq  [Mirabegron  Er] Rash    See 02/27/2023   Sulfa Antibiotics Rash    Chief Complaint  Patient presents with   Family Concerns    HPI:  Her aricept had been stopped as a dose reduction. Since that time; her family has noted that she is more confused than normal. She did not know where she was and did not know the month. Her family would like the aricept restarted.   Past Medical History:  Diagnosis Date   Anemia    Arthritis    Asthma    Atrial fibrillation (HCC)    COVID-19 08/05/2022   Depression    Elevated C-reactive protein (CRP) 06/27/2023   H/O head injury    Hearing loss    Heart murmur    can be heard at times, Doctor said not to worry   History of kidney stones    History of stomach ulcers    Migraine    Pneumonia    Psittacosis    Stroke (HCC)    Weakness of neck 03/02/2013    Past Surgical History:  Procedure Laterality Date   ANKLE FRACTURE SURGERY Left    APPENDECTOMY     BREAST LUMPECTOMY     CESAREAN SECTION     x 3   I & D KNEE WITH POLY EXCHANGE Left 08/16/2022   Procedure: KNEE POLY EXCHANGE;  Surgeon: Cristy Bonner DASEN, MD;  Location: [facility] OR;  Service: Orthopedics;  Laterality: Left;   KNEE ARTHROSCOPY WITH PATELLAR TENDON REPAIR Left 08/16/2022   Procedure: IRRIGATION AND DEBRIDEMENT LEFT KNEE REVISION WITH PATELLAR TENDON REPAIR and poly exchange;  Surgeon: Cristy Bonner DASEN, MD;  Location: [facility] OR;  Service: Orthopedics;  Laterality: Left;   PARTIAL HYSTERECTOMY     ROTATOR CUFF REPAIR Right    TOTAL KNEE ARTHROPLASTY Left  07/22/2022   Procedure: TOTAL KNEE ARTHROPLASTY;  Surgeon: Edna Toribio LABOR, MD;  Location: [facility] ORS;  Service: Orthopedics;  Laterality: Left;    Social History   Socioeconomic History   Marital status: Married    Spouse name: Not on file   Number of children: 3   Years of education: Not on file   Highest education level: Not on file  Occupational History   Occupation: retired  Tobacco Use   Smoking status: Never   Smokeless tobacco: Never  Vaping Use   Vaping status: Never Used  Substance and Sexual Activity   Alcohol  use: Not Currently    Comment: occ beer   Drug use: No   Sexual activity: Yes    Birth control/protection: Surgical    Comment: hyst  Other Topics Concern   Not on file  Social History Narrative   Not on file   Social Drivers of Health   Financial Resource Strain: Medium Risk (01/30/2021)   Overall Financial Resource Strain (CARDIA)    Difficulty of Paying Living Expenses: Somewhat hard  Food Insecurity: No Food Insecurity (08/15/2022)   Hunger Vital Sign    Worried About Running  Out of Food in the Last Year: Never true    Ran Out of Food in the Last Year: Never true  Transportation Needs: No Transportation Needs (08/15/2022)   PRAPARE - Administrator, Civil Service (Medical): No    Lack of Transportation (Non-Medical): No  Physical Activity: Insufficiently Active (01/30/2021)   Exercise Vital Sign    Days of Exercise per Week: 2 days    Minutes of Exercise per Session: 10 min  Stress: No Stress Concern Present (01/30/2021)   [employer] of Occupational Health - Occupational Stress Questionnaire    Feeling of Stress : Only a little  Social Connections: Moderately Integrated (01/30/2021)   Social Connection and Isolation Panel    Frequency of Communication with Friends and Family: Twice a week    Frequency of Social Gatherings with Friends and Family: Twice a week    Attends Religious Services: More than 4 times per year    Active  Member of [employer] or Organizations: No    Attends Banker Meetings: Never    Marital Status: Married  Catering Manager Violence: Not At Risk (08/15/2022)   Humiliation, Afraid, Rape, and Kick questionnaire    Fear of Current or Ex-Partner: No    Emotionally Abused: No    Physically Abused: No    Sexually Abused: No   Family History  Problem Relation Age of Onset   Cancer Father    Heart disease Brother    Alcohol  abuse Brother    Depression Maternal Grandmother    Depression Grandchild    Breast cancer Daughter       VITAL SIGNS BP 120/82   Pulse 67   Temp (!) 97.1 F (36.2 C)   Resp 18   Ht 5' (1.524 m)   Wt 162 lb 12.8 oz (73.8 kg)   SpO2 96%   BMI 31.79 kg/m   Outpatient Encounter Medications as of 09/13/2024  Medication Sig   acetaminophen  (TYLENOL ) 650 MG CR tablet Take 650 mg by mouth every 8 (eight) hours.   apixaban  (ELIQUIS ) 5 MG TABS tablet Take 1 tablet (5 mg total) by mouth 2 (two) times daily.   ARIPiprazole  (ABILIFY ) 5 MG tablet Take 5 mg by mouth daily.   busPIRone  (BUSPAR ) 10 MG tablet Take 10 mg by mouth 3 (three) times daily.   chlorhexidine  (HIBICLENS ) 4 % external liquid Apply topically every other day. topical, Once A Day Every Other Day, Clean between all toes to both feet daily with Hibiclens  and soft gauze every other day   cyanocobalamin  1000 MCG tablet Take 1,000 mcg by mouth daily.   diclofenac Sodium (VOLTAREN) 1 % GEL Apply 2 g topically 2 (two) times daily. Apply to both hands   donepezil (ARICEPT) 5 MG tablet Take 5 mg by mouth at bedtime.   DULoxetine  (CYMBALTA ) 60 MG capsule Take 40 mg by mouth daily.   eszopiclone  (LUNESTA ) 1 MG TABS tablet Take 1 tablet (1 mg total) by mouth at bedtime. Take immediately before bedtime   furosemide  (LASIX ) 20 MG tablet Take 20 mg by mouth.   isosorbide dinitrate (ISORDIL) 10 MG tablet Take 10 mg by mouth 2 (two) times daily.   modafinil  (PROVIGIL ) 100 MG tablet Take 1 tablet (100 mg total) by  mouth daily.   Polyethyl Glycol-Propyl Glycol (SYSTANE) 0.4-0.3 % SOLN Apply to eye 2 (two) times daily as needed.   rosuvastatin (CRESTOR) 5 MG tablet Take 5 mg by mouth at bedtime.   senna-docusate (SENOKOT-S) 8.6-50  MG tablet Take 1 tablet by mouth once as needed for mild constipation.   SUMAtriptan  (IMITREX ) 100 MG tablet Take 100 mg by mouth every 2 (two) hours as needed for migraine or headache. May repeat in 2 hours if headache persists or recurs.   tetrabenazine (XENAZINE) 25 MG tablet Take 50 mg by mouth 2 (two) times daily.   loratadine (CLARITIN) 10 MG tablet Take 10 mg by mouth daily as needed for allergies. (Patient not taking: Reported on 09/13/2024)   UNABLE TO FIND Diet: Regular (Patient not taking: Reported on 09/13/2024)   No facility-administered encounter medications on file as of 09/13/2024.     SIGNIFICANT DIAGNOSTIC EXAMS  LABS REVIEWED PREVIOUS   10-16-23: chol 136; ldl 66; trig 70 hdl 56 11-06-23: glucose 154; bun 27; creat 0.80; k+ 3.6; na++ 139; ca 9.3 gfr >60   12-11-23: wbc 5.0; hgb 11.9; hct 36.7; mcv 99.5 plt 181; hgb A1c 5.3; tsh 2.295 01-12-24: vitamin B12: 225; folate 10.7; tsh 2.081; iron 77 tibc 334 03-18-24: glucose 89; bun 23 creat 0.67; k+ 3.8; na++ 138; ca 9.3 gfr >60; protein 6.5 albumin 3.6; sed rate 10; vitamin B12: 1054; RA factor: <10; ANA neg   NO NEW LABS    Review of Systems  Constitutional:  Negative for malaise/fatigue.  Respiratory:  Negative for cough and shortness of breath.   Cardiovascular:  Negative for chest pain, palpitations and leg swelling.  Gastrointestinal:  Negative for abdominal pain, constipation and heartburn.  Musculoskeletal:  Negative for back pain, joint pain and myalgias.  Skin: Negative.   Neurological:  Negative for dizziness.  Psychiatric/Behavioral:  The patient is not nervous/anxious.     Physical Exam Constitutional:      General: She is not in acute distress.    Appearance: She is well-developed. She is not  diaphoretic.  Neck:     Thyroid : No thyromegaly.  Cardiovascular:     Rate and Rhythm: Normal rate and regular rhythm.     Heart sounds: Normal heart sounds.  Pulmonary:     Effort: Pulmonary effort is normal. No respiratory distress.     Breath sounds: Normal breath sounds.  Abdominal:     General: Bowel sounds are normal. There is no distension.     Palpations: Abdomen is soft.     Tenderness: There is no abdominal tenderness.  Musculoskeletal:        General: Normal range of motion.     Cervical back: Neck supple.     Right lower leg: No edema.     Left lower leg: No edema.     Comments: Has bilateral arthritic changes to both hands  Lymphadenopathy:     Cervical: No cervical adenopathy.  Skin:    General: Skin is warm and dry.  Neurological:     Mental Status: She is alert. Mental status is at baseline.     Comments:  Has bilateral hand tremors    Psychiatric:        Mood and Affect: Mood normal.       ASSESSMENT/ PLAN:  TODAY  Vascular dementia without behavioral disturbance: will restart aricept 5 mg nightly for 4 weeks then 10 mg nightly    Barnie Seip NP [facility]  call [phone]

## 2024-09-14 ENCOUNTER — Encounter: Payer: Self-pay | Admitting: Internal Medicine

## 2024-09-14 ENCOUNTER — Non-Acute Institutional Stay: Payer: Self-pay | Admitting: Internal Medicine

## 2024-09-14 DIAGNOSIS — G2401 Drug induced subacute dyskinesia: Secondary | ICD-10-CM

## 2024-09-14 DIAGNOSIS — E538 Deficiency of other specified B group vitamins: Secondary | ICD-10-CM

## 2024-09-14 DIAGNOSIS — F323 Major depressive disorder, single episode, severe with psychotic features: Secondary | ICD-10-CM | POA: Diagnosis not present

## 2024-09-14 DIAGNOSIS — I480 Paroxysmal atrial fibrillation: Secondary | ICD-10-CM | POA: Diagnosis not present

## 2024-09-14 NOTE — Progress Notes (Unsigned)
 NURSING HOME LOCATION:  [facility] ROOM NUMBER:    CODE STATUS:    PCP: [name], NP   This is a nursing facility follow up visit  of chronic medical diagnoses to document compliance with Regulation 483.30 (c) in The Long Term Care Survey Manual Phase 2 which mandates caregiver visit ( visits can alternate among physician, PA or NP as per statutes) within 10 days of 30 days / 60 days/ 90 days post admission to SNF date  .  Interim medical record and care since last SNF visit was updated with review of diagnostic studies and change in clinical status since last visit were documented.  HPI: She is a permanent resident of this facility with medical diagnoses of history of stroke; past medical history of psittacosis; history of gastric ulcers; history of nephrolithiasis; atrial fibrillation; history of anemia; chronic depression;and history of asthma. She is being actively followed by a Psychiatrist at that office.  Extensive labs were performed on 03/18/2024 and revealed no significant abnormalities.  CKD stage II was present with a GFR greater than 60.  Sed rate was 10 and RA latex troponin less than 10.  B12 is now supratherapeutic at 1054.  Minimal normochromic, normocytic anemia was documented 12/11/2023 with H/H of 11.9/36.7.  Iron panel is normal.  Serially TSH has been therapeutic.  Albumin was low normal at 3.6 and total protein 6.5.  Review of systems: Her major concern is the tremor; anti-TD medication titration has not helped.  She states it has been present for approximately a year.  Her depression has subjectively responded to the current psychotropic regimen and she is pleased with her status.  She describes discomfort in her hands for which topical treatment cream has been prescribed with benefit.  Constipation is chronic.  Constitutional: No fever, significant weight change  Eyes: No redness, discharge, pain, vision change ENT/mouth: No nasal congestion,   purulent discharge, earache, change in hearing, sore throat  Cardiovascular: No chest pain, palpitations, paroxysmal nocturnal dyspnea, edema  Respiratory: No cough, sputum production, hemoptysis, DOE, significant snoring, apnea   Gastrointestinal: No heartburn, dysphagia, abdominal pain, nausea /vomiting, rectal bleeding, melena Genitourinary: No dysuria, hematuria, pyuria, incontinence, nocturia Dermatologic: No rash, pruritus, change in appearance of skin Neurologic: No dizziness, headache, syncope, seizures Psychiatric: No significant insomnia, anorexia Endocrine: No change in hair/skin/nails, excessive thirst, excessive hunger, excessive urination  Hematologic/lymphatic: No significant bruising, lymphadenopathy, abnormal bleeding Allergy/immunology: No itchy/watery eyes, significant sneezing, urticaria, angioedema  Physical exam:  Pertinent or positive findings: Facies tend to be somewhat masked.  Eyebrow density is decreased, especially laterally.  Dental hygiene is excellent.  She exhibits a lateral back-and-forth subtle tremor of her mandible.  Grade 1/2-3 4 systolic murmur is present.  There is slight increase in second heart sound.  Abdomen is protuberant.  Pedal pulses are decreased to palpation.  She has 1/2+ edema at the sock line.  Interosseous wasting of the hands is present.  She has marked isolated PIP and DIP arthritic changes.  There is inversion of the right foot at the ankle.  She exhibits a coarse tremor of both hands almost as if she is grasping at an invisible object.  General appearance:  no acute distress, increased work of breathing is present.   Lymphatic: No lymphadenopathy about the head, neck, axilla. Eyes: No conjunctival inflammation or lid edema is present. There is no scleral icterus. Ears:  External ear exam shows no significant lesions or deformities.   Nose:  External nasal examination shows no deformity or inflammation. Nasal mucosa are pink and moist without  lesions, exudates Oral exam:  Lips and gums are healthy appearing. There is no oropharyngeal erythema or exudate. Neck:  No thyromegaly, masses, tenderness noted.    Heart:  No gallop, click, rub .  Lungs: Chest clear to auscultation without wheezes, rhonchi, rales, rubs. Abdomen: Bowel sounds are normal. Abdomen is soft and nontender with no organomegaly, hernias, masses. GU: Deferred  Extremities:  No cyanosis, clubbing  Neurologic exam :Strength equal  in upper & lower extremities Balance, Rhomberg, finger to nose testing could not be completed due to clinical state Skin: Warm & dry w/o tenting. No significant lesions or rash.  See summary under each active problem in the Problem List with associated updated therapeutic plan :  Tardive dyskinesia She states there is been no improvement in her tremor despite titration of anti TD medications.  She is subjectively pleased with the psychotropic regimen for her depression.  In the context of the polypharmacy; consideration could be given to having her seen by Dr. Evonnie, Movement Disorder specialist. Dr Tat & her Psychiatrist could consult as to appropriate medication adjustment based on their expertise.  Vitamin B 12 deficiency Most recent B12 level was supratherapeutic up from low normal values.  This should be monitored annually to see if dose of supplement should be titrated.  PAF (paroxysmal atrial fibrillation) (HCC) Clinically rhythm is regular and rate well-controlled.  TSH will be updated in the near future.  Major depression with psychotic features [facility]) She is pleased with her response to current psychotropic regimen, but tardive dyskinesia symptoms are problematic. She should discuss this with her Psychiatrist. Also consult with Dt Tat, Movement Disorder specialist is option.PSC providers should not attempt psychotropic management because of duration & complexity of her depression. These mandate psychiatric specialty management.

## 2024-09-14 NOTE — Patient Instructions (Signed)
 See assessment and plan under each diagnosis in the problem list and acutely for this visit

## 2024-09-14 NOTE — Assessment & Plan Note (Signed)
 Most recent B12 level was supratherapeutic up from low normal values.  This should be monitored annually to see if dose of supplement should be titrated.

## 2024-09-14 NOTE — Assessment & Plan Note (Signed)
 Clinically rhythm is regular and rate well-controlled.  TSH will be updated in the near future.

## 2024-09-14 NOTE — Assessment & Plan Note (Signed)
 She states there is been no improvement in her tremor despite titration of medications.  She is subjectively pleased with the psychotropic regimen for her depression.  In the context of the polypharmacy; consideration could be given to having her seen Dr. Evonnie, Movement Disorder specialist.

## 2024-09-16 NOTE — Assessment & Plan Note (Signed)
 She is pleased with her response to current psychotropic regimen, but tardive dyskinesia symptoms are problematic. She should discuss this with her Psychiatrist. Also consult with Dt Tat, Movement Disorder specialist is option.PSC providers should not attempt psychotropic management because of duration & complexity of her depression. These mandate psychiatric specialty management.

## 2024-09-23 ENCOUNTER — Encounter: Payer: Self-pay | Admitting: Professional Counselor

## 2024-09-23 ENCOUNTER — Ambulatory Visit: Admitting: Professional Counselor

## 2024-09-23 DIAGNOSIS — F332 Major depressive disorder, recurrent severe without psychotic features: Secondary | ICD-10-CM | POA: Diagnosis not present

## 2024-09-23 DIAGNOSIS — F411 Generalized anxiety disorder: Secondary | ICD-10-CM | POA: Diagnosis not present

## 2024-09-23 NOTE — Progress Notes (Signed)
 [facility] Initial Adult Exam  Name: [name] Date: 09/23/2024 MRN: [id number] DOB: 1946/06/04 PCP: [name], NP  Time spent: 10:15 AM to 11:11 AM  Guardian/Payee:  pt    Paperwork requested:  No   Reason for Visit /Presenting Problem: Anxiety, depression  Mental Status Exam:    Appearance:   Casual     Behavior:  Appropriate and Sharing  Motor:  Pt presented to session in wheelchair; reports limited mobility including inability to walk; tremors facial and in hands per stroke impact  Speech/Language:   Difficulty with speech due to stroke impact  Affect:  Depressed  Mood:  depressed  Thought process:  normal  Thought content:    WNL  Sensory/Perceptual disturbances:    WNL  Orientation:  oriented to person, place, time/date, and situation  Attention:  Good  Concentration:  Fair  Memory:  WNL  Fund of knowledge:   Good  Insight:    Good  Judgment:   Good  Impulse Control:  Good   Reported Symptoms:  Low mood, sadness, stress, health concerns, phase of life concerns, anhedonia, fatigue, self-esteem concerns, trouble concentrating, restlessness, anxiousness, worries, trouble relaxing, catastrophic thinking  Risk Assessment: Danger to Self:  No Self-injurious Behavior: No Danger to Others: No Duty to Warn:no Physical Aggression / Violence:No  Access to Firearms a concern: No  Gang Involvement:No  Patient / guardian was educated about steps to take if suicide or homicide risk level increases between visits: n/a While future psychiatric events cannot be accurately predicted, the patient does not currently require acute inpatient psychiatric care and does not currently meet [place]  involuntary commitment criteria.  Substance Abuse History: Current substance abuse: No     Past Psychiatric History:   Previous psychological history is significant for anxiety, depression, and insomnia Outpatient Providers: 3 across lifespan History of Psych  Hospitalization: No  Psychological Testing: n/a   Abuse History: Victim of No., n/a   Report needed: No. Victim of Neglect:No. Perpetrator of n/a  Witness / Exposure to Domestic Violence: No   Protective Services Involvement: No  Witness to [employer] Violence:  n/a  Family History: mother: depression Family History  Problem Relation Age of Onset   Cancer Father    Heart disease Brother    Alcohol  abuse Brother    Depression Maternal Grandmother    Depression Grandchild    Breast cancer Daughter     Living situation: the patient lives in a skilled nursing facility  Sexual Orientation:  Straight  Relationship Status: married               If a parent, number of children / ages: 52yo son, 49yo son  Lawyer; spouse, son  Surveyor, Quantity Stress:  Yes   Income/Employment/Disability: retirement from production designer, theatre/television/film at Therapist, Nutritional Service: No   Educational History: Education: associates marine scientist   Religion/Sprituality/World View:   [employer]  Any cultural differences that may affect / interfere with treatment:  not applicable   Recreation/Hobbies: painting, jewelry-making, sewing by history; some social outlets such as Therapist, Music currently  Stressors:Other: living situation, health concerns     Strengths:  Supportive Relationships, Family, Hopefulness, Able to Communicate Effectively, and resiliency, kindheartedness, patient takes comfort in her life's accomplishments   Barriers:  n/a   Legal History: Pending legal issue / charges: The patient has no significant history of legal issues. History of legal issue / charges: n/a  Medical History/Surgical History:reviewed Past Medical History:  Diagnosis Date  Anemia    Arthritis    Asthma    Atrial fibrillation (HCC)    COVID-19 08/05/2022   Depression    Elevated C-reactive protein (CRP) 06/27/2023   H/O head injury    Hearing loss    Heart murmur    can be heard at times, Doctor said  not to worry   History of kidney stones    History of stomach ulcers    Migraine    Pneumonia    Psittacosis    Stroke (HCC)    Weakness of neck 03/02/2013    Past Surgical History:  Procedure Laterality Date   ANKLE FRACTURE SURGERY Left    APPENDECTOMY     BREAST LUMPECTOMY     CESAREAN SECTION     x 3   I & D KNEE WITH POLY EXCHANGE Left 08/16/2022   Procedure: KNEE POLY EXCHANGE;  Surgeon: Cristy Bonner DASEN, MD;  Location: [facility] OR;  Service: Orthopedics;  Laterality: Left;   KNEE ARTHROSCOPY WITH PATELLAR TENDON REPAIR Left 08/16/2022   Procedure: IRRIGATION AND DEBRIDEMENT LEFT KNEE REVISION WITH PATELLAR TENDON REPAIR and poly exchange;  Surgeon: Cristy Bonner DASEN, MD;  Location: [facility] OR;  Service: Orthopedics;  Laterality: Left;   PARTIAL HYSTERECTOMY     ROTATOR CUFF REPAIR Right    TOTAL KNEE ARTHROPLASTY Left 07/22/2022   Procedure: TOTAL KNEE ARTHROPLASTY;  Surgeon: Edna Toribio LABOR, MD;  Location: [facility];  Service: Orthopedics;  Laterality: Left;    Medications: Current Outpatient Medications  Medication Sig Dispense Refill   acetaminophen  (TYLENOL ) 650 MG CR tablet Take 650 mg by mouth every 8 (eight) hours.     apixaban  (ELIQUIS ) 5 MG TABS tablet Take 1 tablet (5 mg total) by mouth 2 (two) times daily. 60 tablet    ARIPiprazole  (ABILIFY ) 5 MG tablet Take 5 mg by mouth daily.     busPIRone  (BUSPAR ) 10 MG tablet Take 10 mg by mouth 3 (three) times daily.     chlorhexidine  (HIBICLENS ) 4 % external liquid Apply topically every other day. topical, Once A Day Every Other Day, Clean between all toes to both feet daily with Hibiclens  and soft gauze every other day     cyanocobalamin  1000 MCG tablet Take 1,000 mcg by mouth daily.     diclofenac Sodium (VOLTAREN) 1 % GEL Apply 2 g topically 2 (two) times daily. Apply to both hands     donepezil (ARICEPT) 5 MG tablet Take 5 mg by mouth at bedtime.     DULoxetine  (CYMBALTA ) 60 MG capsule Take 40 mg by mouth daily.     eszopiclone   (LUNESTA ) 1 MG TABS tablet Take 1 tablet (1 mg total) by mouth at bedtime. Take immediately before bedtime 30 tablet 0   furosemide  (LASIX ) 20 MG tablet Take 20 mg by mouth.     isosorbide dinitrate (ISORDIL) 10 MG tablet Take 10 mg by mouth 2 (two) times daily.     loratadine (CLARITIN) 10 MG tablet Take 10 mg by mouth daily as needed for allergies. (Patient not taking: Reported on 09/13/2024)     modafinil  (PROVIGIL ) 100 MG tablet Take 1 tablet (100 mg total) by mouth daily. 30 tablet 0   Polyethyl Glycol-Propyl Glycol (SYSTANE) 0.4-0.3 % SOLN Apply to eye 2 (two) times daily as needed.     rosuvastatin (CRESTOR) 5 MG tablet Take 5 mg by mouth at bedtime.     senna-docusate (SENOKOT-S) 8.6-50 MG tablet Take 1 tablet by mouth once as needed for mild  constipation.     SUMAtriptan  (IMITREX ) 100 MG tablet Take 100 mg by mouth every 2 (two) hours as needed for migraine or headache. May repeat in 2 hours if headache persists or recurs.     tetrabenazine (XENAZINE) 25 MG tablet Take 50 mg by mouth 2 (two) times daily.     UNABLE TO FIND Diet: Regular (Patient not taking: Reported on 09/13/2024)     No current facility-administered medications for this visit.    Allergies  Allergen Reactions   Ticlid [Ticlopidine] Shortness Of Breath   Nsaids Other (See Comments)    Bleeding ulcer   Prednisone Other (See Comments)    Makes her stomach hurt--knows this isn't an allergy but doesn't want to take it    Trazodone And Nefazodone      hjallucinations   Monistat [Miconazole] Swelling and Rash   Myrbetriq  [Mirabegron  Er] Rash    See 02/27/2023   Sulfa Antibiotics Rash    Diagnoses:    ICD-10-CM   1. Severe episode of recurrent major depressive disorder, without psychotic features (HCC)  F33.2     2. Generalized anxiety disorder  F41.1       Treatment Provided: Counselor provided person-centered counseling including active listening, building of rapport; clinical assessment; facilitation of PHQ-9  with a score of 17 and GAD-7 with a score of 16.  Patient did not endorse symptoms of mania.  Patient presented to intake with her husband.  She identified psychotropic medicines to help mitigate symptomology somewhat.  She identified challenges in recent years with health concerns, including as relates dementia, mobility concerns after a knee surgery, and several strokes.  She identified being stressed and lonely at current residence (rehabilitation center) in spite of daily visits from husband.  Counselor and patient discussed patient's support, strengths, and needs where future sessions are concerned, including as relates telehealth, given patient transportation and mobility issues.  Counselor and patient discussed patient boosting advocacy and enrichment opportunities including visits from others in external community.  Counselor and patient began to discuss patient counseling goals.  Plan of Care: Patient is scheduled for follow-up; continue to build rapport, assess symptoms and history, discuss treatment plan and obtain consent.  Almarie ONEIDA Sprang, [facility]

## 2024-09-27 ENCOUNTER — Non-Acute Institutional Stay (SKILLED_NURSING_FACILITY): Payer: Self-pay | Admitting: Student

## 2024-09-27 DIAGNOSIS — F5104 Psychophysiologic insomnia: Secondary | ICD-10-CM

## 2024-09-27 DIAGNOSIS — F323 Major depressive disorder, single episode, severe with psychotic features: Secondary | ICD-10-CM | POA: Diagnosis not present

## 2024-09-27 DIAGNOSIS — I480 Paroxysmal atrial fibrillation: Secondary | ICD-10-CM | POA: Diagnosis not present

## 2024-09-27 DIAGNOSIS — F0150 Vascular dementia without behavioral disturbance: Secondary | ICD-10-CM

## 2024-09-27 DIAGNOSIS — G2401 Drug induced subacute dyskinesia: Secondary | ICD-10-CM

## 2024-09-27 NOTE — Progress Notes (Signed)
 Location:  [facility]   Place of Service:      CODE STATUS: FULL  Allergies  Allergen Reactions   Ticlid [Ticlopidine] Shortness Of Breath   Nsaids Other (See Comments)    Bleeding ulcer   Prednisone Other (See Comments)    Makes her stomach hurt--knows this isn't an allergy but doesn't want to take it    Trazodone And Nefazodone      hjallucinations   Monistat [Miconazole] Swelling and Rash   Myrbetriq  [Mirabegron  Er] Rash    See 02/27/2023   Sulfa Antibiotics Rash    No chief complaint on file.   HPI: Regulatory follow-up visit. Chronic problems/medications were reviewed and updated as necessary.  [name] is in her usual state of health today.  Her primary concern is vivid nightmares.  She reports  a man takes me away and leaves me in a cabin every night.  I cannot tell if this is real or dream.  Patient was in tears describing nightmare.  She reports this has been going on for about 1-2 weeks now.  Of note, I was informed by staff that patient has been waking up at night more.  She was alert and oriented this morning. It appears that the symptoms may be related to the restarting of her Aricept.  Past Medical History:  Diagnosis Date   Anemia    Arthritis    Asthma    Atrial fibrillation (HCC)    COVID-19 08/05/2022   Depression    Elevated C-reactive protein (CRP) 06/27/2023   H/O head injury    Hearing loss    Heart murmur    can be heard at times, Doctor said not to worry   History of kidney stones    History of stomach ulcers    Migraine    Pneumonia    Psittacosis    Stroke (HCC)    Weakness of neck 03/02/2013    Past Surgical History:  Procedure Laterality Date   ANKLE FRACTURE SURGERY Left    APPENDECTOMY     BREAST LUMPECTOMY     CESAREAN SECTION     x 3   I & D KNEE WITH POLY EXCHANGE Left 08/16/2022   Procedure: KNEE POLY EXCHANGE;  Surgeon: Cristy Bonner DASEN, MD;  Location: [facility];  Service: Orthopedics;  Laterality: Left;   KNEE  ARTHROSCOPY WITH PATELLAR TENDON REPAIR Left 08/16/2022   Procedure: IRRIGATION AND DEBRIDEMENT LEFT KNEE REVISION WITH PATELLAR TENDON REPAIR and poly exchange;  Surgeon: Cristy Bonner DASEN, MD;  Location: [facility];  Service: Orthopedics;  Laterality: Left;   PARTIAL HYSTERECTOMY     ROTATOR CUFF REPAIR Right    TOTAL KNEE ARTHROPLASTY Left 07/22/2022   Procedure: TOTAL KNEE ARTHROPLASTY;  Surgeon: Edna Toribio LABOR, MD;  Location: [facility];  Service: Orthopedics;  Laterality: Left;    Social History   Socioeconomic History   Marital status: Married    Spouse name: Not on file   Number of children: 3   Years of education: Not on file   Highest education level: Not on file  Occupational History   Occupation: retired  Tobacco Use   Smoking status: Never   Smokeless tobacco: Never  Vaping Use   Vaping status: Never Used  Substance and Sexual Activity   Alcohol  use: Not Currently    Comment: occ beer   Drug use: No   Sexual activity: Yes    Birth control/protection: Surgical    Comment: hyst  Other Topics  Concern   Not on file  Social History Narrative   Not on file   Social Drivers of Health   Financial Resource Strain: Medium Risk (01/30/2021)   Overall Financial Resource Strain (CARDIA)    Difficulty of Paying Living Expenses: Somewhat hard  Food Insecurity: No Food Insecurity (08/15/2022)   Hunger Vital Sign    Worried About Running Out of Food in the Last Year: Never true    Ran Out of Food in the Last Year: Never true  Transportation Needs: No Transportation Needs (08/15/2022)   PRAPARE - Administrator, Civil Service (Medical): No    Lack of Transportation (Non-Medical): No  Physical Activity: Insufficiently Active (01/30/2021)   Exercise Vital Sign    Days of Exercise per Week: 2 days    Minutes of Exercise per Session: 10 min  Stress: No Stress Concern Present (01/30/2021)   [employer] of Occupational Health - Occupational Stress Questionnaire     Feeling of Stress : Only a little  Social Connections: Moderately Integrated (01/30/2021)   Social Connection and Isolation Panel    Frequency of Communication with Friends and Family: Twice a week    Frequency of Social Gatherings with Friends and Family: Twice a week    Attends Religious Services: More than 4 times per year    Active Member of [employer] or Organizations: No    Attends Banker Meetings: Never    Marital Status: Married  Catering Manager Violence: Not At Risk (08/15/2022)   Humiliation, Afraid, Rape, and Kick questionnaire    Fear of Current or Ex-Partner: No    Emotionally Abused: No    Physically Abused: No    Sexually Abused: No   Family History  Problem Relation Age of Onset   Cancer Father    Heart disease Brother    Alcohol  abuse Brother    Depression Maternal Grandmother    Depression Grandchild    Breast cancer Daughter     VITAL SIGNS BP (!) 95/54   Pulse 65   Temp 98 F (36.7 C)   Resp 18   SpO2 95%   Outpatient Encounter Medications as of 09/27/2024  Medication Sig   acetaminophen  (TYLENOL ) 650 MG CR tablet Take 650 mg by mouth every 8 (eight) hours.   apixaban  (ELIQUIS ) 5 MG TABS tablet Take 1 tablet (5 mg total) by mouth 2 (two) times daily.   ARIPiprazole  (ABILIFY ) 5 MG tablet Take 5 mg by mouth daily.   busPIRone  (BUSPAR ) 10 MG tablet Take 10 mg by mouth 3 (three) times daily.   chlorhexidine  (HIBICLENS ) 4 % external liquid Apply topically every other day. topical, Once A Day Every Other Day, Clean between all toes to both feet daily with Hibiclens  and soft gauze every other day   cyanocobalamin  1000 MCG tablet Take 1,000 mcg by mouth daily.   diclofenac Sodium (VOLTAREN) 1 % GEL Apply 2 g topically 2 (two) times daily. Apply to both hands   donepezil (ARICEPT) 5 MG tablet Take 5 mg by mouth at bedtime.   DULoxetine  (CYMBALTA ) 60 MG capsule Take 40 mg by mouth daily.   eszopiclone  (LUNESTA ) 1 MG TABS tablet Take 1 tablet (1 mg total)  by mouth at bedtime. Take immediately before bedtime   furosemide  (LASIX ) 20 MG tablet Take 20 mg by mouth.   isosorbide dinitrate (ISORDIL) 10 MG tablet Take 10 mg by mouth 2 (two) times daily.   loratadine (CLARITIN) 10 MG tablet Take 10 mg by  mouth daily as needed for allergies. (Patient not taking: Reported on 09/13/2024)   modafinil  (PROVIGIL ) 100 MG tablet Take 1 tablet (100 mg total) by mouth daily.   Polyethyl Glycol-Propyl Glycol (SYSTANE) 0.4-0.3 % SOLN Apply to eye 2 (two) times daily as needed.   rosuvastatin (CRESTOR) 5 MG tablet Take 5 mg by mouth at bedtime.   senna-docusate (SENOKOT-S) 8.6-50 MG tablet Take 1 tablet by mouth once as needed for mild constipation.   SUMAtriptan  (IMITREX ) 100 MG tablet Take 100 mg by mouth every 2 (two) hours as needed for migraine or headache. May repeat in 2 hours if headache persists or recurs.   tetrabenazine (XENAZINE) 25 MG tablet Take 50 mg by mouth 2 (two) times daily.   UNABLE TO FIND Diet: Regular (Patient not taking: Reported on 09/13/2024)   No facility-administered encounter medications on file as of 09/27/2024.    SIGNIFICANT DIAGNOSTIC EXAMS None  ASSESSMENT/ PLAN:  PAF (paroxysmal atrial fibrillation): Rate controlled.  Continue metoprolol  25 mg twice daily.  Continue Eliquis  5 mg twice daily. Vascular dementia without disturbance: Worsening vivid nightmares, potentially associated with restarting Aricept 5 mg nightly. Given likely lack of efficacy with medication + worsening adverse effects, plan to discontinue Aricept. Psychosis in elderly/major depression: Continue current psychotropic agents.  Recommend continued care with psychiatry. Tardive dyskinesia: Recommend follow-up with her psychiatrist.  Per Dr. Alisa note, he recommend consideration of consult with Dr. Evonnie movement disorder specialist.  Continue tetrabenazine 25 mg twice daily. Chronic insomnia: Continue Lunesta .  Continue to Provigil .  D/C Aricept as above. Low   blood pressure reading: MAP >65, asymptomatic. No change in regimen at this time. Continue to monitor.  Barnie Seip NP [facility]  Contact [phone] Monday through Friday 8am- 5pm  After hours call [phone]

## 2024-09-27 NOTE — Addendum Note (Signed)
 Addended by: [name] on: 09/27/2024 11:52 AM   Modules accepted: Level of Service

## 2024-09-28 DIAGNOSIS — F5105 Insomnia due to other mental disorder: Secondary | ICD-10-CM | POA: Diagnosis not present

## 2024-09-28 DIAGNOSIS — F332 Major depressive disorder, recurrent severe without psychotic features: Secondary | ICD-10-CM | POA: Diagnosis not present

## 2024-09-28 DIAGNOSIS — F411 Generalized anxiety disorder: Secondary | ICD-10-CM | POA: Diagnosis not present

## 2024-10-14 ENCOUNTER — Encounter: Payer: Self-pay | Admitting: Adult Health

## 2024-10-14 ENCOUNTER — Non-Acute Institutional Stay (SKILLED_NURSING_FACILITY): Payer: Self-pay | Admitting: Adult Health

## 2024-10-14 DIAGNOSIS — J3089 Other allergic rhinitis: Secondary | ICD-10-CM

## 2024-10-14 DIAGNOSIS — F0150 Vascular dementia without behavioral disturbance: Secondary | ICD-10-CM

## 2024-10-14 DIAGNOSIS — K5909 Other constipation: Secondary | ICD-10-CM | POA: Diagnosis not present

## 2024-10-14 DIAGNOSIS — R4189 Other symptoms and signs involving cognitive functions and awareness: Secondary | ICD-10-CM

## 2024-10-14 DIAGNOSIS — R29818 Other symptoms and signs involving the nervous system: Secondary | ICD-10-CM | POA: Diagnosis not present

## 2024-10-14 NOTE — Progress Notes (Signed)
 Location:  [facility] Room Number: 101 Place of Service:  SNF (31)   CODE STATUS: full code   Allergies  Allergen Reactions   Ticlid [Ticlopidine] Shortness Of Breath   Nsaids Other (See Comments)    Bleeding ulcer   Prednisone Other (See Comments)    Makes her stomach hurt--knows this isn't an allergy but doesn't want to take it    Trazodone And Nefazodone      hjallucinations   Monistat [Miconazole] Swelling and Rash   Myrbetriq  [Mirabegron  Er] Rash    See 02/27/2023   Sulfa Antibiotics Rash    Chief Complaint  Patient presents with   Medical Management of Chronic Issues         Uncomplicated asthma unspecified asthma severity unspecified whether persistent:  Chronic constipation: Vascular dementia without behavioral disturbance/neurocognitive deficit:    HPI:  She is a 78 y.o. long term resident of this facility being seen for the management of her chronic illnesses:  Uncomplicated asthma unspecified asthma severity unspecified whether persistent:  Chronic constipation: Vascular dementia without behavioral disturbance/neurocognitive deficit. There are no reports of uncontrolled pain. Her tremors are without change. She was unable to tolerate aricept due to nightmares. There are no reports of behavioral disturbances.    Past Medical History:  Diagnosis Date   Anemia    Arthritis    Asthma    Atrial fibrillation (HCC)    COVID-19 08/05/2022   Depression    Elevated C-reactive protein (CRP) 06/27/2023   H/O head injury    Hearing loss    Heart murmur    can be heard at times, Doctor said not to worry   History of kidney stones    History of stomach ulcers    Migraine    Pneumonia    Psittacosis    Stroke (HCC)    Weakness of neck 03/02/2013    Past Surgical History:  Procedure Laterality Date   ANKLE FRACTURE SURGERY Left    APPENDECTOMY     BREAST LUMPECTOMY     CESAREAN SECTION     x 3   I & D KNEE WITH POLY EXCHANGE Left  08/16/2022   Procedure: KNEE POLY EXCHANGE;  Surgeon: Cristy Bonner DASEN, MD;  Location: [facility] OR;  Service: Orthopedics;  Laterality: Left;   KNEE ARTHROSCOPY WITH PATELLAR TENDON REPAIR Left 08/16/2022   Procedure: IRRIGATION AND DEBRIDEMENT LEFT KNEE REVISION WITH PATELLAR TENDON REPAIR and poly exchange;  Surgeon: Cristy Bonner DASEN, MD;  Location: [facility] OR;  Service: Orthopedics;  Laterality: Left;   PARTIAL HYSTERECTOMY     ROTATOR CUFF REPAIR Right    TOTAL KNEE ARTHROPLASTY Left 07/22/2022   Procedure: TOTAL KNEE ARTHROPLASTY;  Surgeon: Edna Toribio LABOR, MD;  Location: [facility] ORS;  Service: Orthopedics;  Laterality: Left;    Social History   Socioeconomic History   Marital status: Married    Spouse name: Not on file   Number of children: 3   Years of education: Not on file   Highest education level: Not on file  Occupational History   Occupation: retired  Tobacco Use   Smoking status: Never   Smokeless tobacco: Never  Vaping Use   Vaping status: Never Used  Substance and Sexual Activity   Alcohol  use: Not Currently    Comment: occ beer   Drug use: No   Sexual activity: Yes    Birth control/protection: Surgical    Comment: hyst  Other Topics Concern   Not on file  Social  History Narrative   Not on file   Social Drivers of Health   Financial Resource Strain: Medium Risk (01/30/2021)   Overall Financial Resource Strain (CARDIA)    Difficulty of Paying Living Expenses: Somewhat hard  Food Insecurity: No Food Insecurity (08/15/2022)   Hunger Vital Sign    Worried About Running Out of Food in the Last Year: Never true    Ran Out of Food in the Last Year: Never true  Transportation Needs: No Transportation Needs (08/15/2022)   PRAPARE - Administrator, Civil Service (Medical): No    Lack of Transportation (Non-Medical): No  Physical Activity: Insufficiently Active (01/30/2021)   Exercise Vital Sign    Days of Exercise per Week: 2 days    Minutes of Exercise per Session: 10  min  Stress: No Stress Concern Present (01/30/2021)   [employer] of Occupational Health - Occupational Stress Questionnaire    Feeling of Stress : Only a little  Social Connections: Moderately Integrated (01/30/2021)   Social Connection and Isolation Panel    Frequency of Communication with Friends and Family: Twice a week    Frequency of Social Gatherings with Friends and Family: Twice a week    Attends Religious Services: More than 4 times per year    Active Member of [employer] or Organizations: No    Attends Banker Meetings: Never    Marital Status: Married  Catering Manager Violence: Not At Risk (08/15/2022)   Humiliation, Afraid, Rape, and Kick questionnaire    Fear of Current or Ex-Partner: No    Emotionally Abused: No    Physically Abused: No    Sexually Abused: No   Family History  Problem Relation Age of Onset   Cancer Father    Heart disease Brother    Alcohol  abuse Brother    Depression Maternal Grandmother    Depression Grandchild    Breast cancer Daughter       VITAL SIGNS BP 106/60   Pulse 73   Temp (!) 97.1 F (36.2 C)   Resp 18   Ht 5' (1.524 m)   Wt 159 lb 12.8 oz (72.5 kg)   SpO2 99%   BMI 31.21 kg/m   Outpatient Encounter Medications as of 10/14/2024  Medication Sig   UNABLE TO FIND Diet: Regular   acetaminophen  (TYLENOL ) 650 MG CR tablet Take 650 mg by mouth every 8 (eight) hours.   apixaban  (ELIQUIS ) 5 MG TABS tablet Take 1 tablet (5 mg total) by mouth 2 (two) times daily.   ARIPiprazole  (ABILIFY ) 5 MG tablet Take 5 mg by mouth daily.   busPIRone  (BUSPAR ) 10 MG tablet Take 10 mg by mouth 3 (three) times daily.   chlorhexidine  (HIBICLENS ) 4 % external liquid Apply topically every other day. topical, Once A Day Every Other Day, Clean between all toes to both feet daily with Hibiclens  and soft gauze every other day   cyanocobalamin  1000 MCG tablet Take 1,000 mcg by mouth daily.   diclofenac Sodium (VOLTAREN) 1 % GEL Apply 2 g  topically 2 (two) times daily. Apply to both hands   DULoxetine  (CYMBALTA ) 60 MG capsule Take 40 mg by mouth daily.   eszopiclone  (LUNESTA ) 1 MG TABS tablet Take 1 tablet (1 mg total) by mouth at bedtime. Take immediately before bedtime   furosemide  (LASIX ) 20 MG tablet Take 20 mg by mouth.   isosorbide dinitrate (ISORDIL) 10 MG tablet Take 10 mg by mouth 2 (two) times daily.   modafinil  (PROVIGIL )  100 MG tablet Take 1 tablet (100 mg total) by mouth daily.   Polyethyl Glycol-Propyl Glycol (SYSTANE) 0.4-0.3 % SOLN Apply to eye 2 (two) times daily as needed.   rosuvastatin (CRESTOR) 5 MG tablet Take 5 mg by mouth at bedtime.   senna-docusate (SENOKOT-S) 8.6-50 MG tablet Take 1 tablet by mouth once as needed for mild constipation.   SUMAtriptan  (IMITREX ) 100 MG tablet Take 100 mg by mouth every 2 (two) hours as needed for migraine or headache. May repeat in 2 hours if headache persists or recurs.   tetrabenazine (XENAZINE) 25 MG tablet Take 50 mg by mouth 2 (two) times daily.   No facility-administered encounter medications on file as of 10/14/2024.     SIGNIFICANT DIAGNOSTIC EXAMS  LABS REVIEWED PREVIOUS   10-16-23: chol 136; ldl 66; trig 70 hdl 56 11-06-23: glucose 154; bun 27; creat 0.80; k+ 3.6; na++ 139; ca 9.3 gfr >60   12-11-23: wbc 5.0; hgb 11.9; hct 36.7; mcv 99.5 plt 181; hgb A1c 5.3; tsh 2.295 01-12-24: vitamin B12: 225; folate 10.7; tsh 2.081; iron 77 tibc 334 03-18-24: glucose 89; bun 23 creat 0.67; k+ 3.8; na++ 138; ca 9.3 gfr >60; protein 6.5 albumin 3.6; sed rate 10; vitamin B12: 1054; RA factor: <10; ANA neg   NO NEW LABS    Review of Systems  Constitutional:  Negative for malaise/fatigue.  Respiratory:  Negative for cough and shortness of breath.   Cardiovascular:  Negative for chest pain, palpitations and leg swelling.  Gastrointestinal:  Negative for abdominal pain, constipation and heartburn.  Musculoskeletal:  Negative for back pain, joint pain and myalgias.  Skin:  Negative.   Neurological:  Positive for tremors. Negative for dizziness.  Psychiatric/Behavioral:  The patient is not nervous/anxious.     Physical Exam Constitutional:      General: She is not in acute distress.    Appearance: She is well-developed. She is obese. She is not diaphoretic.  Neck:     Thyroid : No thyromegaly.  Cardiovascular:     Rate and Rhythm: Normal rate and regular rhythm.     Heart sounds: Normal heart sounds.  Pulmonary:     Effort: Pulmonary effort is normal. No respiratory distress.     Breath sounds: Normal breath sounds.  Abdominal:     General: Bowel sounds are normal. There is no distension.     Palpations: Abdomen is soft.     Tenderness: There is no abdominal tenderness.  Musculoskeletal:        General: Normal range of motion.     Cervical back: Neck supple.     Right lower leg: No edema.     Left lower leg: No edema.     Comments: Facial tremors Bilateral hand tremors Has bilateral arthritic changes to both hands   Lymphadenopathy:     Cervical: No cervical adenopathy.  Skin:    General: Skin is warm and dry.  Neurological:     Mental Status: She is alert. Mental status is at baseline.  Psychiatric:        Mood and Affect: Mood normal.     Comments: 10-14-22: SLUMS 18/30            ASSESSMENT/ PLAN:  TODAY  Uncomplicated asthma unspecified asthma severity unspecified whether persistent: is off medications  2. Chronic constipation: will continue senna s twice daily  3. Vascular dementia without behavioral disturbance/neurocognitive deficit: weight is 159.8 pounds is not on aricept due to nightmares.    PREVIOUS  5. Migraine without status migrainosus, non intractable unspecified; type: has prn imitrex   6. Chronic pain syndrome: is on cymbalta  60 mg daily and tylenol  650 mg nightly   7. Bilateral lower extremity edema: will continue lasix  20 mg daily   8. Tardive dyskinesia: is having increased movements and tremors; will  continue tetrabenzine  50 mg twice daily and inderal 10 mg twice daily will monitor her status. Failed both ingrezza  and austeda   9. Somnolence: did not tolerate every other day dosing will continue provigil  100 mg daily  10.  Chronic insomnia: did not tolerate every other night dosing will continue lunesta  1 mg nightly   11. Chronic anemia: hgb 11.9; iron studies have been normal   12. Psychosis in elderly/major depression with psychotic features: will continue abilify  5 mg daily; cymbalta  60 mg daily and buspar  20 mg three times daily   13. Protein calorie malnutrition: is presently not on supplements.   14. Vitamin B 12 deficiency: level 1054; will continue supplement 1000 mcg daily   15. Urinary incontinence: is without change; was unable to tolerate myrbetriq   16. Long standing persistent atrial fibrillation/history of DVT: heart rate remains stable will continue eliquis  5 mg twice daily     Barnie Seip NP [facility]  call [phone]

## 2024-10-18 ENCOUNTER — Other Ambulatory Visit (HOSPITAL_COMMUNITY)
Admission: RE | Admit: 2024-10-18 | Discharge: 2024-10-18 | Disposition: A | Source: Skilled Nursing Facility | Attending: Adult Health | Admitting: Adult Health

## 2024-10-18 DIAGNOSIS — I87303 Chronic venous hypertension (idiopathic) without complications of bilateral lower extremity: Secondary | ICD-10-CM | POA: Insufficient documentation

## 2024-10-18 DIAGNOSIS — E119 Type 2 diabetes mellitus without complications: Secondary | ICD-10-CM | POA: Diagnosis not present

## 2024-10-18 LAB — COMPREHENSIVE METABOLIC PANEL WITH GFR
ALT: 9 U/L (ref 0–44)
AST: 22 U/L (ref 15–41)
Albumin: 4.1 g/dL (ref 3.5–5.0)
Alkaline Phosphatase: 56 U/L (ref 38–126)
Anion gap: 4 — ABNORMAL LOW (ref 5–15)
BUN: 24 mg/dL — ABNORMAL HIGH (ref 8–23)
CO2: 33 mmol/L — ABNORMAL HIGH (ref 22–32)
Calcium: 9.1 mg/dL (ref 8.9–10.3)
Chloride: 104 mmol/L (ref 98–111)
Creatinine, Ser: 0.6 mg/dL (ref 0.44–1.00)
GFR, Estimated: >60 mL/min (ref >60)
Glucose, Bld: 85 mg/dL (ref 70–99)
Potassium: 3.9 mmol/L (ref 3.5–5.1)
Sodium: 142 mmol/L (ref 135–145)
Total Bilirubin: 0.5 mg/dL (ref 0.0–1.2)
Total Protein: 6.4 g/dL — ABNORMAL LOW (ref 6.5–8.1)

## 2024-10-18 LAB — CBC
HCT: 36.1 % (ref 36.0–46.0)
Hemoglobin: 11.4 g/dL — ABNORMAL LOW (ref 12.0–15.0)
MCH: 31.8 pg (ref 26.0–34.0)
MCHC: 31.6 g/dL (ref 30.0–36.0)
MCV: 100.8 fL — ABNORMAL HIGH (ref 80.0–100.0)
Platelets: 163 K/uL (ref 150–400)
RBC: 3.58 MIL/uL — ABNORMAL LOW (ref 3.87–5.11)
RDW: 13.2 % (ref 11.5–15.5)
WBC: 4.9 K/uL (ref 4.0–10.5)
nRBC: 0 % (ref 0.0–0.2)

## 2024-10-18 LAB — HEMOGLOBIN A1C
Hgb A1c MFr Bld: 5.3 % (ref 4.8–5.6)
Mean Plasma Glucose: 105.41 mg/dL

## 2024-10-19 DIAGNOSIS — F5105 Insomnia due to other mental disorder: Secondary | ICD-10-CM | POA: Diagnosis not present

## 2024-10-19 DIAGNOSIS — F332 Major depressive disorder, recurrent severe without psychotic features: Secondary | ICD-10-CM | POA: Diagnosis not present

## 2024-10-19 DIAGNOSIS — F411 Generalized anxiety disorder: Secondary | ICD-10-CM | POA: Diagnosis not present

## 2024-10-21 ENCOUNTER — Other Ambulatory Visit: Payer: Self-pay | Admitting: Adult Health

## 2024-10-21 MED ORDER — ESZOPICLONE 1 MG PO TABS: 1.0000 mg | ORAL_TABLET | Freq: Every day | ORAL | 0 refills | Status: DC

## 2024-10-21 MED ORDER — MODAFINIL 100 MG PO TABS: 100.0000 mg | ORAL_TABLET | Freq: Every day | ORAL | 0 refills | Status: DC

## 2024-10-22 ENCOUNTER — Non-Acute Institutional Stay (SKILLED_NURSING_FACILITY): Admitting: Adult Health

## 2024-10-22 ENCOUNTER — Encounter: Payer: Self-pay | Admitting: Adult Health

## 2024-10-22 DIAGNOSIS — F0150 Vascular dementia without behavioral disturbance: Secondary | ICD-10-CM

## 2024-10-22 DIAGNOSIS — I480 Paroxysmal atrial fibrillation: Secondary | ICD-10-CM | POA: Diagnosis not present

## 2024-10-22 DIAGNOSIS — G2401 Drug induced subacute dyskinesia: Secondary | ICD-10-CM | POA: Diagnosis not present

## 2024-10-22 NOTE — Progress Notes (Signed)
 Location:  [facility] Room Number: 101 Place of Service:  SNF (31)   CODE STATUS: full   Allergies[1]  Chief Complaint  Patient presents with   Acute Visit    Care plan meeting     HPI:  We have come together for her care plan meeting. Family present.  BIMS 15/15 mood 0/30. She uses wheelchair without falls. She requires moderate assist with her adl care. She is incontinent of bladder and bowel. Dietary: feeds self; weight is 157 pounds; regular diet; appetite 76-100%. Therapy: none at this time. Activities: does participate. She will continue to be followed for her chronic illnesses including:   PAF(paroxsymal atrial fibrillation)  Tardive dyskinesia    Vascular dementia without behavioral disturbance   Past Medical History:  Diagnosis Date   Anemia    Arthritis    Asthma    Atrial fibrillation (HCC)    COVID-19 08/05/2022   Depression    Elevated C-reactive protein (CRP) 06/27/2023   H/O head injury    Hearing loss    Heart murmur    can be heard at times, Doctor said not to worry   History of kidney stones    History of stomach ulcers    Migraine    Pneumonia    Psittacosis    Stroke (HCC)    Weakness of neck 03/02/2013    Past Surgical History:  Procedure Laterality Date   ANKLE FRACTURE SURGERY Left    APPENDECTOMY     BREAST LUMPECTOMY     CESAREAN SECTION     x 3   I & D KNEE WITH POLY EXCHANGE Left 08/16/2022   Procedure: KNEE POLY EXCHANGE;  Surgeon: Cristy Bonner DASEN, MD;  Location: [facility] OR;  Service: Orthopedics;  Laterality: Left;   KNEE ARTHROSCOPY WITH PATELLAR TENDON REPAIR Left 08/16/2022   Procedure: IRRIGATION AND DEBRIDEMENT LEFT KNEE REVISION WITH PATELLAR TENDON REPAIR and poly exchange;  Surgeon: Cristy Bonner DASEN, MD;  Location: [facility] OR;  Service: Orthopedics;  Laterality: Left;   PARTIAL HYSTERECTOMY     ROTATOR CUFF REPAIR Right    TOTAL KNEE ARTHROPLASTY Left 07/22/2022   Procedure: TOTAL KNEE ARTHROPLASTY;  Surgeon:  Edna Toribio LABOR, MD;  Location: [facility];  Service: Orthopedics;  Laterality: Left;    Social History   Socioeconomic History   Marital status: Married    Spouse name: Not on file   Number of children: 3   Years of education: Not on file   Highest education level: Not on file  Occupational History   Occupation: retired  Tobacco Use   Smoking status: Never   Smokeless tobacco: Never  Vaping Use   Vaping status: Never Used  Substance and Sexual Activity   Alcohol  use: Not Currently    Comment: occ beer   Drug use: No   Sexual activity: Yes    Birth control/protection: Surgical    Comment: hyst  Other Topics Concern   Not on file  Social History Narrative   Not on file   Social Drivers of Health   Tobacco Use: Low Risk (10/22/2024)   Patient History    Smoking Tobacco Use: Never    Smokeless Tobacco Use: Never    Passive Exposure: Not on file  Financial Resource Strain: Not on file  Food Insecurity: No Food Insecurity (08/15/2022)   Hunger Vital Sign    Worried About Running Out of Food in the Last Year: Never true    Ran Out of Food  in the Last Year: Never true  Transportation Needs: No Transportation Needs (08/15/2022)   PRAPARE - Administrator, Civil Service (Medical): No    Lack of Transportation (Non-Medical): No  Physical Activity: Not on file  Stress: Not on file  Social Connections: Not on file  Intimate Partner Violence: Not At Risk (08/15/2022)   Humiliation, Afraid, Rape, and Kick questionnaire    Fear of Current or Ex-Partner: No    Emotionally Abused: No    Physically Abused: No    Sexually Abused: No  Depression (PHQ2-9): High Risk (09/23/2024)   Depression (PHQ2-9)    PHQ-2 Score: 17  Alcohol  Screen: Not on file  Housing: Low Risk (08/15/2022)   Housing    Last Housing Risk Score: 0  Utilities: Not At Risk (08/15/2022)   AHC Utilities    Threatened with loss of utilities: No  Health Literacy: Not on file   Family History   Problem Relation Age of Onset   Cancer Father    Heart disease Brother    Alcohol  abuse Brother    Depression Maternal Grandmother    Depression Grandchild    Breast cancer Daughter       VITAL SIGNS BP 125/61   Pulse 60   Temp 98.3 F (36.8 C)   Resp 20   Ht 5' (1.524 m)   Wt 157 lb 3.2 oz (71.3 kg)   SpO2 96%   BMI 30.70 kg/m   Outpatient Encounter Medications as of 10/22/2024  Medication Sig   acetaminophen  (TYLENOL ) 650 MG CR tablet Take 650 mg by mouth every 8 (eight) hours.   apixaban  (ELIQUIS ) 5 MG TABS tablet Take 1 tablet (5 mg total) by mouth 2 (two) times daily.   ARIPiprazole  (ABILIFY ) 5 MG tablet Take 5 mg by mouth daily.   busPIRone  (BUSPAR ) 10 MG tablet Take 10 mg by mouth 3 (three) times daily.   chlorhexidine  (HIBICLENS ) 4 % external liquid Apply topically every other day. topical, Once A Day Every Other Day, Clean between all toes to both feet daily with Hibiclens  and soft gauze every other day   cyanocobalamin  1000 MCG tablet Take 1,000 mcg by mouth daily.   diclofenac Sodium (VOLTAREN) 1 % GEL Apply 2 g topically 2 (two) times daily. Apply to both hands   DULoxetine  (CYMBALTA ) 60 MG capsule Take 40 mg by mouth daily.   eszopiclone  (LUNESTA ) 1 MG TABS tablet Take 1 tablet (1 mg total) by mouth at bedtime. Take immediately before bedtime   furosemide  (LASIX ) 20 MG tablet Take 20 mg by mouth.   isosorbide dinitrate (ISORDIL) 10 MG tablet Take 10 mg by mouth 2 (two) times daily.   modafinil  (PROVIGIL ) 100 MG tablet Take 1 tablet (100 mg total) by mouth daily.   Polyethyl Glycol-Propyl Glycol (SYSTANE) 0.4-0.3 % SOLN Apply to eye 2 (two) times daily as needed.   rosuvastatin (CRESTOR) 5 MG tablet Take 5 mg by mouth at bedtime.   senna-docusate (SENOKOT-S) 8.6-50 MG tablet Take 1 tablet by mouth once as needed for mild constipation.   SUMAtriptan  (IMITREX ) 100 MG tablet Take 100 mg by mouth every 2 (two) hours as needed for migraine or headache. May repeat in 2  hours if headache persists or recurs.   tetrabenazine (XENAZINE) 25 MG tablet Take 50 mg by mouth 2 (two) times daily.   UNABLE TO FIND Diet: Regular   No facility-administered encounter medications on file as of 10/22/2024.     SIGNIFICANT DIAGNOSTIC EXAMS  LABS REVIEWED PREVIOUS   10-16-23: chol 136; ldl 66; trig 70 hdl 56 11-06-23: glucose 154; bun 27; creat 0.80; k+ 3.6; na++ 139; ca 9.3 gfr >60   12-11-23: wbc 5.0; hgb 11.9; hct 36.7; mcv 99.5 plt 181; hgb A1c 5.3; tsh 2.295 01-12-24: vitamin B12: 225; folate 10.7; tsh 2.081; iron 77 tibc 334 03-18-24: glucose 89; bun 23 creat 0.67; k+ 3.8; na++ 138; ca 9.3 gfr >60; protein 6.5 albumin 3.6; sed rate 10; vitamin B12: 1054; RA factor: <10; ANA neg   NO NEW LABS    Review of Systems  Constitutional:  Negative for malaise/fatigue.  Respiratory:  Negative for cough and shortness of breath.   Cardiovascular:  Negative for chest pain, palpitations and leg swelling.  Gastrointestinal:  Negative for abdominal pain, constipation and heartburn.  Musculoskeletal:  Negative for back pain, joint pain and myalgias.  Skin: Negative.   Neurological:  Positive for tremors. Negative for dizziness.  Psychiatric/Behavioral:  The patient is not nervous/anxious.     Physical Exam Constitutional:      General: She is not in acute distress.    Appearance: She is well-developed. She is obese. She is not diaphoretic.  Neck:     Thyroid : No thyromegaly.  Cardiovascular:     Rate and Rhythm: Normal rate and regular rhythm.     Heart sounds: Normal heart sounds.  Pulmonary:     Effort: Pulmonary effort is normal. No respiratory distress.     Breath sounds: Normal breath sounds.  Abdominal:     General: Bowel sounds are normal. There is no distension.     Palpations: Abdomen is soft.     Tenderness: There is no abdominal tenderness.  Musculoskeletal:     Cervical back: Neck supple.     Right lower leg: No edema.     Left lower leg: No edema.      Comments: Facial tremors Bilateral hand tremors Has bilateral arthritic changes to both hands     Lymphadenopathy:     Cervical: No cervical adenopathy.  Skin:    General: Skin is warm and dry.  Neurological:     Mental Status: She is alert. Mental status is at baseline.     Comments: 10-14-22: SLUMS 18/30        ASSESSMENT/ PLAN:  TODAY  PAF(paroxsymal atrial fibrillation) Tardive dyskinesia Vascular dementia without behavioral disturbance  Will increase tetrabenazine to 50 mg three times daily   Will continue current plan of care Will continue to monitor her status.   Time spent with patient: 40 minutes: medications; dietary activities.    Barnie Seip NP [facility]  call [phone]     [1]  Allergies Allergen Reactions   Ticlid [Ticlopidine] Shortness Of Breath   Nsaids Other (See Comments)    Bleeding ulcer   Prednisone Other (See Comments)    Makes her stomach hurt--knows this isn't an allergy but doesn't want to take it    Trazodone And Nefazodone      hjallucinations   Monistat [Miconazole] Swelling and Rash   Myrbetriq  [Mirabegron  Er] Rash    See 02/27/2023   Sulfa Antibiotics Rash

## 2024-10-30 ENCOUNTER — Other Ambulatory Visit (HOSPITAL_COMMUNITY)
Admission: RE | Admit: 2024-10-30 | Discharge: 2024-10-30 | Disposition: A | Discharge status: Home / Self Care | Source: Skilled Nursing Facility | Attending: Adult Health | Admitting: Adult Health

## 2024-10-30 DIAGNOSIS — M7981 Nontraumatic hematoma of soft tissue: Secondary | ICD-10-CM | POA: Insufficient documentation

## 2024-10-30 LAB — HEMOGLOBIN AND HEMATOCRIT, BLOOD
HCT: 36.3 % (ref 36.0–46.0)
Hemoglobin: 11.6 g/dL — ABNORMAL LOW (ref 12.0–15.0)

## 2024-11-08 ENCOUNTER — Encounter: Payer: Self-pay | Admitting: Pharmacist

## 2024-11-08 NOTE — Progress Notes (Signed)
" ° °  11/08/2024 Name: [name] MRN: [id number] DOB: 1946/06/11  Chief Complaint  Patient presents with   Medication Assistance    Rosuvastatin   Pharmacy Quality Measure Review  This patient is appearing on a report for being at risk of failing the adherence measure for cholesterol (statin) medications this calendar year.   Medication: Rosuvastatin 5 mg Last fill date: 09/05/2024 for 30 day supply  Patient lives in a nursing home and gets her medications filled at [facility]. They are a long term care pharmacy and fill medications for 7 day cycle packs and do one month composite billing.   Cassius DOROTHA Brought, PharmD, BCACP Clinical Pharmacist [phone]   "

## 2024-11-09 ENCOUNTER — Non-Acute Institutional Stay (SKILLED_NURSING_FACILITY): Admitting: Adult Health

## 2024-11-09 ENCOUNTER — Other Ambulatory Visit: Payer: Self-pay

## 2024-11-09 ENCOUNTER — Emergency Department (HOSPITAL_COMMUNITY)
Admission: EM | Admit: 2024-11-09 | Discharge: 2024-11-10 | Disposition: A | Discharge status: Skilled Nursing Facility | Source: Skilled Nursing Facility | Attending: Emergency Medicine | Admitting: Emergency Medicine

## 2024-11-09 ENCOUNTER — Encounter: Payer: Self-pay | Admitting: Adult Health

## 2024-11-09 ENCOUNTER — Encounter (HOSPITAL_COMMUNITY): Payer: Self-pay

## 2024-11-09 ENCOUNTER — Emergency Department (HOSPITAL_COMMUNITY)

## 2024-11-09 DIAGNOSIS — Z7901 Long term (current) use of anticoagulants: Secondary | ICD-10-CM | POA: Insufficient documentation

## 2024-11-09 DIAGNOSIS — W01198A Fall on same level from slipping, tripping and stumbling with subsequent striking against other object, initial encounter: Secondary | ICD-10-CM | POA: Diagnosis not present

## 2024-11-09 DIAGNOSIS — F0150 Vascular dementia without behavioral disturbance: Secondary | ICD-10-CM | POA: Insufficient documentation

## 2024-11-09 DIAGNOSIS — I4891 Unspecified atrial fibrillation: Secondary | ICD-10-CM | POA: Insufficient documentation

## 2024-11-09 DIAGNOSIS — I480 Paroxysmal atrial fibrillation: Secondary | ICD-10-CM

## 2024-11-09 DIAGNOSIS — R251 Tremor, unspecified: Secondary | ICD-10-CM | POA: Diagnosis not present

## 2024-11-09 DIAGNOSIS — S0990XA Unspecified injury of head, initial encounter: Secondary | ICD-10-CM | POA: Diagnosis present

## 2024-11-09 DIAGNOSIS — I82412 Acute embolism and thrombosis of left femoral vein: Secondary | ICD-10-CM | POA: Diagnosis not present

## 2024-11-09 DIAGNOSIS — S161XXA Strain of muscle, fascia and tendon at neck level, initial encounter: Secondary | ICD-10-CM

## 2024-11-09 DIAGNOSIS — W19XXXA Unspecified fall, initial encounter: Secondary | ICD-10-CM

## 2024-11-09 NOTE — ED Provider Notes (Signed)
 " [place] EMERGENCY DEPARTMENT AT [facility] Provider Note   CSN: [id number] Arrival date & time: 11/09/24  2146     Patient presents with: [name] is a 78 y.o. female.   Patient is a 78 year old female with past medical history of vascular dementia, atrial fibrillation on Eliquis .  Patient sent from her extended care facility for evaluation of a fall.  She bent forward to pick an object up off the floor, then fell forward out of her chair striking her head on the floor.  She denies any loss of consciousness, but does have swelling and bruising to her forehead.  She describes some pain in this area and some discomfort in her neck.  No weakness or numbness.  No other injury in the fall.       Prior to Admission medications  Medication Sig Start Date End Date Taking? Authorizing Provider  acetaminophen  (TYLENOL ) 650 MG CR tablet Take 650 mg by mouth every 8 (eight) hours.    [provider]  apixaban  (ELIQUIS ) 5 MG TABS tablet Take 1 tablet (5 mg total) by mouth 2 (two) times daily. 08/20/22   Jillian Buttery, MD  ARIPiprazole  (ABILIFY ) 5 MG tablet Take 5 mg by mouth daily.    [provider]  busPIRone  (BUSPAR ) 10 MG tablet Take 10 mg by mouth 3 (three) times daily.    [provider]  chlorhexidine  (HIBICLENS ) 4 % external liquid Apply topically every other day. topical, Once A Day Every Other Day, Clean between all toes to both feet daily with Hibiclens  and soft gauze every other day    [provider]  cyanocobalamin  1000 MCG tablet Take 1,000 mcg by mouth daily.    [provider]  diclofenac Sodium (VOLTAREN) 1 % GEL Apply 2 g topically 2 (two) times daily. Apply to both hands    [provider]  DULoxetine  (CYMBALTA ) 60 MG capsule Take 40 mg by mouth daily.    [provider]  eszopiclone  (LUNESTA ) 1 MG TABS tablet Take 1 tablet (1 mg total) by mouth at bedtime. Take immediately before bedtime  10/21/24   [name], NP  furosemide  (LASIX ) 20 MG tablet Take 20 mg by mouth.    [provider]  isosorbide dinitrate (ISORDIL) 10 MG tablet Take 10 mg by mouth 2 (two) times daily.    [provider]  modafinil  (PROVIGIL ) 100 MG tablet Take 1 tablet (100 mg total) by mouth daily. 10/21/24   [name], NP  oseltamivir (TAMIFLU) 75 MG capsule Take 75 mg by mouth daily. 11/05/24   [provider]  Polyethyl Glycol-Propyl Glycol (SYSTANE) 0.4-0.3 % SOLN Apply to eye 2 (two) times daily as needed.    [provider]  rosuvastatin (CRESTOR) 5 MG tablet Take 5 mg by mouth at bedtime.    [provider]  senna-docusate (SENOKOT-S) 8.6-50 MG tablet Take 1 tablet by mouth once as needed for mild constipation.    [provider]  SUMAtriptan  (IMITREX ) 100 MG tablet Take 100 mg by mouth every 2 (two) hours as needed for migraine or headache. May repeat in 2 hours if headache persists or recurs.    [provider]  tetrabenazine (XENAZINE) 25 MG tablet Take 50 mg by mouth 3 (three) times daily.    [provider]  UNABLE TO FIND Diet: Regular    [provider]  warfarin (COUMADIN) 5 MG tablet Take 5 mg by mouth daily.  [provider]    Allergies: Ticlid [ticlopidine], Nsaids, Prednisone, Trazodone and nefazodone , Monistat [miconazole], Myrbetriq  [mirabegron  er], and Sulfa antibiotics    Review of Systems  All other systems reviewed and are negative.   Updated Vital Signs BP 103/65 (BP Location: Right Arm)   Pulse 86   Temp 98.4 F (36.9 C) (Oral)   Resp 18   SpO2 94%   Physical Exam Vitals and nursing note reviewed.  Constitutional:      General: She is not in acute distress.    Appearance: She is well-developed. She is not diaphoretic.  HENT:     Head: Normocephalic and atraumatic.  Eyes:     Extraocular Movements: Extraocular movements intact.     Pupils: Pupils are equal, round,  and reactive to light.  Cardiovascular:     Rate and Rhythm: Normal rate.  Pulmonary:     Effort: Pulmonary effort is normal.  Abdominal:     General: There is no distension.     Tenderness: There is no abdominal tenderness.  Musculoskeletal:        General: Normal range of motion.     Cervical back: Normal range of motion and neck supple.  Skin:    General: Skin is warm and dry.  Neurological:     General: No focal deficit present.     Mental Status: She is alert and oriented to person, place, and time.     Cranial Nerves: No cranial nerve deficit.     Motor: No weakness.     Comments: Baseline tremor is noted.     (all labs ordered are listed, but only abnormal results are displayed) Labs Reviewed - No data to display  EKG: None  Radiology: No results found.   Procedures   Medications Ordered in the ED - No data to display                                  Medical Decision Making  Patient is a 78 year old female presenting after a fall.  She fell out of her chair onto the floor and struck her head.  She has a contusion to the forehead and takes Eliquis  for atrial fibrillation.  Patient experienced no loss of consciousness and is neurologically intact at the time of presentation.  Vital signs are stable and physical examination otherwise unremarkable.  CT scans of the head and cervical spine obtained showing no intracranial abnormality and no cervical spine fracture.  Patient seems appropriate for discharge.  To return as needed for any problems.     Final diagnoses:  None    ED Discharge Orders     None          Geroldine Berg, MD 11/09/24 2323  "

## 2024-11-09 NOTE — ED Triage Notes (Signed)
 Pt from [facility] with fall. AAOx4. No LOC. Pt states she was in wheel chair bending over to pick am item, off the floor when she fell face first. Hematoma noted to right side forehead. Pt on eliquis  twice daily.

## 2024-11-09 NOTE — Discharge Instructions (Signed)
 Continue medications as previously prescribed.  Return to the ER for worsening headache, difficulty with balance, visual disturbances, new weakness or numbness, or for other new and concerning symptoms.

## 2024-11-09 NOTE — Progress Notes (Signed)
 " Location:  [facility] Room Number: North/101/W Place of Service:  SNF (31)   CODE STATUS: Full Code  Allergies[1]  Chief Complaint  Patient presents with   Doppler Study     Follow up    HPI:  The past 2 weeks she has had increasingly worsening edema in her left lower extremity. Yesterday there was pain present in the calf area without pedal pulse present and 4+ pitting edema. There was no redness or warmth present. She has been taking elquis for her atrial fibrillation. Her doppler study is positive for dvt: left mid femoral vein and left distal femoral vein. She has failed eliquis  therapy and will need to be on warfarin therapy for her life time.   Past Medical History:  Diagnosis Date   Anemia    Arthritis    Asthma    Atrial fibrillation (HCC)    COVID-19 08/05/2022   Depression    Elevated C-reactive protein (CRP) 06/27/2023   H/O head injury    Hearing loss    Heart murmur    can be heard at times, Doctor said not to worry   History of kidney stones    History of stomach ulcers    Migraine    Pneumonia    Psittacosis    Stroke (HCC)    Weakness of neck 03/02/2013    Past Surgical History:  Procedure Laterality Date   ANKLE FRACTURE SURGERY Left    APPENDECTOMY     BREAST LUMPECTOMY     CESAREAN SECTION     x 3   I & D KNEE WITH POLY EXCHANGE Left 08/16/2022   Procedure: KNEE POLY EXCHANGE;  Surgeon: Cristy Bonner DASEN, MD;  Location: [facility] OR;  Service: Orthopedics;  Laterality: Left;   KNEE ARTHROSCOPY WITH PATELLAR TENDON REPAIR Left 08/16/2022   Procedure: IRRIGATION AND DEBRIDEMENT LEFT KNEE REVISION WITH PATELLAR TENDON REPAIR and poly exchange;  Surgeon: Cristy Bonner DASEN, MD;  Location: [facility] OR;  Service: Orthopedics;  Laterality: Left;   PARTIAL HYSTERECTOMY     ROTATOR CUFF REPAIR Right    TOTAL KNEE ARTHROPLASTY Left 07/22/2022   Procedure: TOTAL KNEE ARTHROPLASTY;  Surgeon: Edna Toribio LABOR, MD;  Location: [facility] ORS;  Service:  Orthopedics;  Laterality: Left;    Social History   Socioeconomic History   Marital status: Married    Spouse name: Not on file   Number of children: 3   Years of education: Not on file   Highest education level: Not on file  Occupational History   Occupation: retired  Tobacco Use   Smoking status: Never   Smokeless tobacco: Never  Vaping Use   Vaping status: Never Used  Substance and Sexual Activity   Alcohol  use: Not Currently    Comment: occ beer   Drug use: No   Sexual activity: Not Currently    Birth control/protection: Surgical    Comment: hyst  Other Topics Concern   Not on file  Social History Narrative   Not on file   Social Drivers of Health   Tobacco Use: Low Risk (11/09/2024)   Patient History    Smoking Tobacco Use: Never    Smokeless Tobacco Use: Never    Passive Exposure: Not on file  Financial Resource Strain: Not on file  Food Insecurity: No Food Insecurity (08/15/2022)   Hunger Vital Sign    Worried About Running Out of Food in the Last Year: Never true    Ran Out of Food  in the Last Year: Never true  Transportation Needs: No Transportation Needs (08/15/2022)   PRAPARE - Administrator, Civil Service (Medical): No    Lack of Transportation (Non-Medical): No  Physical Activity: Not on file  Stress: Not on file  Social Connections: Not on file  Intimate Partner Violence: Not At Risk (08/15/2022)   Humiliation, Afraid, Rape, and Kick questionnaire    Fear of Current or Ex-Partner: No    Emotionally Abused: No    Physically Abused: No    Sexually Abused: No  Depression (PHQ2-9): High Risk (09/23/2024)   Depression (PHQ2-9)    PHQ-2 Score: 17  Alcohol  Screen: Not on file  Housing: Low Risk (08/15/2022)   Housing    Last Housing Risk Score: 0  Utilities: Not At Risk (08/15/2022)   AHC Utilities    Threatened with loss of utilities: No  Health Literacy: Not on file   Family History  Problem Relation Age of Onset   Cancer Father     Heart disease Brother    Alcohol  abuse Brother    Depression Maternal Grandmother    Depression Grandchild    Breast cancer Daughter       VITAL SIGNS BP 126/80   Pulse 68   Temp (!) 97.5 F (36.4 C)   Resp 20   Ht 5' (1.524 m)   Wt 157 lb 3.2 oz (71.3 kg)   SpO2 99%   BMI 30.70 kg/m   Outpatient Encounter Medications as of 11/09/2024  Medication Sig   acetaminophen  (TYLENOL ) 650 MG CR tablet Take 650 mg by mouth every 8 (eight) hours.   apixaban  (ELIQUIS ) 5 MG TABS tablet Take 1 tablet (5 mg total) by mouth 2 (two) times daily.   ARIPiprazole  (ABILIFY ) 5 MG tablet Take 5 mg by mouth daily.   busPIRone  (BUSPAR ) 10 MG tablet Take 10 mg by mouth 3 (three) times daily.   chlorhexidine  (HIBICLENS ) 4 % external liquid Apply topically every other day. topical, Once A Day Every Other Day, Clean between all toes to both feet daily with Hibiclens  and soft gauze every other day   cyanocobalamin  1000 MCG tablet Take 1,000 mcg by mouth daily.   diclofenac Sodium (VOLTAREN) 1 % GEL Apply 2 g topically 2 (two) times daily. Apply to both hands   DULoxetine  (CYMBALTA ) 60 MG capsule Take 40 mg by mouth daily.   eszopiclone  (LUNESTA ) 1 MG TABS tablet Take 1 tablet (1 mg total) by mouth at bedtime. Take immediately before bedtime   furosemide  (LASIX ) 20 MG tablet Take 20 mg by mouth.   isosorbide dinitrate (ISORDIL) 10 MG tablet Take 10 mg by mouth 2 (two) times daily.   modafinil  (PROVIGIL ) 100 MG tablet Take 1 tablet (100 mg total) by mouth daily.   oseltamivir (TAMIFLU) 75 MG capsule Take 75 mg by mouth daily.   Polyethyl Glycol-Propyl Glycol (SYSTANE) 0.4-0.3 % SOLN Apply to eye 2 (two) times daily as needed.   rosuvastatin (CRESTOR) 5 MG tablet Take 5 mg by mouth at bedtime.   senna-docusate (SENOKOT-S) 8.6-50 MG tablet Take 1 tablet by mouth once as needed for mild constipation.   SUMAtriptan  (IMITREX ) 100 MG tablet Take 100 mg by mouth every 2 (two) hours as needed for migraine or  headache. May repeat in 2 hours if headache persists or recurs.   tetrabenazine (XENAZINE) 25 MG tablet Take 50 mg by mouth 3 (three) times daily.   UNABLE TO FIND Diet: Regular   No facility-administered encounter  medications on file as of 11/09/2024.     SIGNIFICANT DIAGNOSTIC EXAMS  LABS REVIEWED PREVIOUS   10-16-23: chol 136; ldl 66; trig 70 hdl 56 11-06-23: glucose 154; bun 27; creat 0.80; k+ 3.6; na++ 139; ca 9.3 gfr >60   12-11-23: wbc 5.0; hgb 11.9; hct 36.7; mcv 99.5 plt 181; hgb A1c 5.3; tsh 2.295 01-12-24: vitamin B12: 225; folate 10.7; tsh 2.081; iron 77 tibc 334 03-18-24: glucose 89; bun 23 creat 0.67; k+ 3.8; na++ 138; ca 9.3 gfr >60; protein 6.5 albumin 3.6; sed rate 10; vitamin B12: 1054; RA factor: <10; ANA neg   NO NEW LABS    Review of Systems  Constitutional:  Negative for malaise/fatigue.  Respiratory:  Negative for cough and shortness of breath.   Cardiovascular:  Positive for leg swelling. Negative for chest pain and palpitations.  Gastrointestinal:  Negative for abdominal pain, constipation and heartburn.  Musculoskeletal:  Negative for back pain, joint pain and myalgias.  Skin: Negative.   Neurological:  Negative for dizziness.  Psychiatric/Behavioral:  The patient is not nervous/anxious.     Physical Exam Constitutional:      General: She is not in acute distress.    Appearance: She is well-developed. She is not diaphoretic.  Neck:     Thyroid : No thyromegaly.  Cardiovascular:     Rate and Rhythm: Normal rate and regular rhythm.     Heart sounds: Normal heart sounds.  Pulmonary:     Effort: Pulmonary effort is normal. No respiratory distress.     Breath sounds: Normal breath sounds.  Abdominal:     General: Bowel sounds are normal. There is no distension.     Palpations: Abdomen is soft.     Tenderness: There is no abdominal tenderness.  Musculoskeletal:     Cervical back: Neck supple.     Right lower leg: No edema.     Left lower leg: Edema  present.     Comments: Left lower extremity: 4+ pitting edema: pain in the calf care; no pedal pulse; no redness or warmth present.   Lymphadenopathy:     Cervical: No cervical adenopathy.  Skin:    General: Skin is warm and dry.  Neurological:     Mental Status: She is alert. Mental status is at baseline.  Psychiatric:        Mood and Affect: Mood normal.       ASSESSMENT/ PLAN:  TODAY  Acute deep vein thrombosis (DVT) of femoral vein of left lower extremity PAF (paroxymal atrial fibrillation)  Will begin coumadin 5 mg daily and will check INR on 11-12-24; will continue eliquis  until INR therapeutic at 2.5-3.5    Barnie Seip NP [facility]   call [phone]       [1]  Allergies Allergen Reactions   Ticlid [Ticlopidine] Shortness Of Breath   Nsaids Other (See Comments)    Bleeding ulcer   Prednisone Other (See Comments)    Makes her stomach hurt--knows this isn't an allergy but doesn't want to take it    Trazodone And Nefazodone      hjallucinations   Monistat [Miconazole] Swelling and Rash   Myrbetriq  [Mirabegron  Er] Rash    See 02/27/2023   Sulfa Antibiotics Rash   "

## 2024-11-12 ENCOUNTER — Other Ambulatory Visit (HOSPITAL_COMMUNITY)
Admission: RE | Admit: 2024-11-12 | Discharge: 2024-11-12 | Disposition: A | Discharge status: Home / Self Care | Source: Skilled Nursing Facility | Attending: Adult Health | Admitting: Adult Health

## 2024-11-12 ENCOUNTER — Encounter: Payer: Self-pay | Admitting: Adult Health

## 2024-11-12 ENCOUNTER — Non-Acute Institutional Stay: Payer: Self-pay | Admitting: Adult Health

## 2024-11-12 DIAGNOSIS — Z7901 Long term (current) use of anticoagulants: Secondary | ICD-10-CM

## 2024-11-12 DIAGNOSIS — F0390 Unspecified dementia without behavioral disturbance: Secondary | ICD-10-CM | POA: Diagnosis not present

## 2024-11-12 DIAGNOSIS — I82402 Acute embolism and thrombosis of unspecified deep veins of left lower extremity: Secondary | ICD-10-CM | POA: Insufficient documentation

## 2024-11-12 DIAGNOSIS — I82412 Acute embolism and thrombosis of left femoral vein: Secondary | ICD-10-CM

## 2024-11-12 DIAGNOSIS — I480 Paroxysmal atrial fibrillation: Secondary | ICD-10-CM | POA: Diagnosis not present

## 2024-11-12 LAB — PROTIME-INR
INR: 1.2 (ref 0.8–1.2)
Prothrombin Time: 15.9 s — ABNORMAL HIGH (ref 11.4–15.2)

## 2024-11-12 NOTE — Progress Notes (Signed)
 " Location:  [facility] Room Number: 4 W Place of Service:  SNF (31)   CODE STATUS: Full Code   Allergies[1]  Chief Complaint  Patient presents with   INR Management    HPI:  She has failed eliquis  therapy for her atrial fibrillation and developed a dvt in her left lower extremity. She was started on coumadin therapy at 5 mg daily her INR today is 1.2. Her INR goal is 2.5-3.5. there are no reports of bleeding present. This will be long term therapy for her. Her family reports that her delusions and hallucinations are worse. She is afraid of her delusions and they are keeping her awake at night. Her dose was originally lowered to 5 mg due to her tremors. This did not relieve the tremors. Her family is in agreement with increasing the dose.   Past Medical History:  Diagnosis Date   Anemia    Arthritis    Asthma    Atrial fibrillation (HCC)    COVID-19 08/05/2022   Depression    Elevated C-reactive protein (CRP) 06/27/2023   H/O head injury    Hearing loss    Heart murmur    can be heard at times, Doctor said not to worry   History of kidney stones    History of stomach ulcers    Migraine    Pneumonia    Psittacosis    Stroke (HCC)    Weakness of neck 03/02/2013    Past Surgical History:  Procedure Laterality Date   ANKLE FRACTURE SURGERY Left    APPENDECTOMY     BREAST LUMPECTOMY     CESAREAN SECTION     x 3   I & D KNEE WITH POLY EXCHANGE Left 08/16/2022   Procedure: KNEE POLY EXCHANGE;  Surgeon: Cristy Bonner DASEN, MD;  Location: [facility] OR;  Service: Orthopedics;  Laterality: Left;   KNEE ARTHROSCOPY WITH PATELLAR TENDON REPAIR Left 08/16/2022   Procedure: IRRIGATION AND DEBRIDEMENT LEFT KNEE REVISION WITH PATELLAR TENDON REPAIR and poly exchange;  Surgeon: Cristy Bonner DASEN, MD;  Location: [facility] OR;  Service: Orthopedics;  Laterality: Left;   PARTIAL HYSTERECTOMY     ROTATOR CUFF REPAIR Right    TOTAL KNEE ARTHROPLASTY Left 07/22/2022   Procedure: TOTAL  KNEE ARTHROPLASTY;  Surgeon: Edna Toribio LABOR, MD;  Location: [facility] ORS;  Service: Orthopedics;  Laterality: Left;    Social History   Socioeconomic History   Marital status: Married    Spouse name: Not on file   Number of children: 3   Years of education: Not on file   Highest education level: Not on file  Occupational History   Occupation: retired  Tobacco Use   Smoking status: Never   Smokeless tobacco: Never  Vaping Use   Vaping status: Never Used  Substance and Sexual Activity   Alcohol  use: Not Currently    Comment: occ beer   Drug use: No   Sexual activity: Not Currently    Birth control/protection: Surgical    Comment: hyst  Other Topics Concern   Not on file  Social History Narrative   Not on file   Social Drivers of Health   Tobacco Use: Low Risk (11/12/2024)   Patient History    Smoking Tobacco Use: Never    Smokeless Tobacco Use: Never    Passive Exposure: Not on file  Financial Resource Strain: Not on file  Food Insecurity: No Food Insecurity (08/15/2022)   Hunger Vital Sign  Worried About Programme Researcher, Broadcasting/film/video in the Last Year: Never true    Ran Out of Food in the Last Year: Never true  Transportation Needs: No Transportation Needs (08/15/2022)   PRAPARE - Administrator, Civil Service (Medical): No    Lack of Transportation (Non-Medical): No  Physical Activity: Not on file  Stress: Not on file  Social Connections: Not on file  Intimate Partner Violence: Not At Risk (08/15/2022)   Humiliation, Afraid, Rape, and Kick questionnaire    Fear of Current or Ex-Partner: No    Emotionally Abused: No    Physically Abused: No    Sexually Abused: No  Depression (PHQ2-9): High Risk (09/23/2024)   Depression (PHQ2-9)    PHQ-2 Score: 17  Alcohol  Screen: Not on file  Housing: Low Risk (08/15/2022)   Housing    Last Housing Risk Score: 0  Utilities: Not At Risk (08/15/2022)   AHC Utilities    Threatened with loss of utilities: No  Health Literacy:  Not on file   Family History  Problem Relation Age of Onset   Cancer Father    Heart disease Brother    Alcohol  abuse Brother    Depression Maternal Grandmother    Depression Grandchild    Breast cancer Daughter       VITAL SIGNS BP 116/70   Pulse 68   Temp 98.1 F (36.7 C)   Resp 18   Ht 5' (1.524 m)   Wt 157 lb 3.2 oz (71.3 kg)   SpO2 95%   BMI 30.70 kg/m   Outpatient Encounter Medications as of 11/12/2024  Medication Sig   acetaminophen  (TYLENOL ) 650 MG CR tablet Take 650 mg by mouth every 8 (eight) hours.   apixaban  (ELIQUIS ) 5 MG TABS tablet Take 1 tablet (5 mg total) by mouth 2 (two) times daily.   ARIPiprazole  (ABILIFY ) 5 MG tablet Take 5 mg by mouth daily.   busPIRone  (BUSPAR ) 10 MG tablet Take 10 mg by mouth 3 (three) times daily.   chlorhexidine  (HIBICLENS ) 4 % external liquid Apply topically every other day. topical, Once A Day Every Other Day, Clean between all toes to both feet daily with Hibiclens  and soft gauze every other day   cyanocobalamin  1000 MCG tablet Take 1,000 mcg by mouth daily.   diclofenac Sodium (VOLTAREN) 1 % GEL Apply 2 g topically 2 (two) times daily. Apply to both hands   DULoxetine  (CYMBALTA ) 60 MG capsule Take 40 mg by mouth daily.   eszopiclone  (LUNESTA ) 1 MG TABS tablet Take 1 tablet (1 mg total) by mouth at bedtime. Take immediately before bedtime   furosemide  (LASIX ) 20 MG tablet Take 20 mg by mouth.   isosorbide dinitrate (ISORDIL) 10 MG tablet Take 10 mg by mouth 2 (two) times daily.   modafinil  (PROVIGIL ) 100 MG tablet Take 1 tablet (100 mg total) by mouth daily.   oseltamivir (TAMIFLU) 75 MG capsule Take 75 mg by mouth at bedtime.   Polyethyl Glycol-Propyl Glycol (SYSTANE) 0.4-0.3 % SOLN Apply to eye 2 (two) times daily as needed.   rosuvastatin (CRESTOR) 5 MG tablet Take 5 mg by mouth at bedtime.   senna-docusate (SENOKOT-S) 8.6-50 MG tablet Take 1 tablet by mouth once as needed for mild constipation.   SUMAtriptan  (IMITREX ) 100 MG  tablet Take 100 mg by mouth every 2 (two) hours as needed for migraine or headache. May repeat in 2 hours if headache persists or recurs.   tetrabenazine (XENAZINE) 25 MG tablet Take 50  mg by mouth 3 (three) times daily.   UNABLE TO FIND Diet: Regular   warfarin (COUMADIN) 5 MG tablet Take 6 mg by mouth daily. Special Instructions: dvt failed eliquis  Once A Day 04:00 PM   No facility-administered encounter medications on file as of 11/12/2024.     SIGNIFICANT DIAGNOSTIC EXAMS  LABS REVIEWED PREVIOUS   12-11-23: wbc 5.0; hgb 11.9; hct 36.7; mcv 99.5 plt 181; hgb A1c 5.3; tsh 2.295 01-12-24: vitamin B12: 225; folate 10.7; tsh 2.081; iron 77 tibc 334 03-18-24: glucose 89; bun 23 creat 0.67; k+ 3.8; na++ 138; ca 9.3 gfr >60; protein 6.5 albumin 3.6; sed rate 10; vitamin B12: 1054; RA factor: <10; ANA neg   TODAY  11-12-24: inr 1.2 on 5 mg daily    Review of Systems  Constitutional:  Negative for malaise/fatigue.  Respiratory:  Negative for cough and shortness of breath.   Cardiovascular:  Positive for leg swelling. Negative for chest pain and palpitations.  Gastrointestinal:  Negative for abdominal pain, constipation and heartburn.  Musculoskeletal:  Negative for back pain, joint pain and myalgias.  Skin: Negative.   Neurological:  Negative for dizziness.  Psychiatric/Behavioral:  The patient is not nervous/anxious.     Physical Exam Constitutional:      General: She is not in acute distress.    Appearance: She is well-developed. She is not diaphoretic.  Neck:     Thyroid : No thyromegaly.  Cardiovascular:     Rate and Rhythm: Normal rate and regular rhythm.     Heart sounds: Normal heart sounds.  Pulmonary:     Effort: Pulmonary effort is normal. No respiratory distress.     Breath sounds: Normal breath sounds.  Abdominal:     General: Bowel sounds are normal. There is no distension.     Palpations: Abdomen is soft.     Tenderness: There is no abdominal tenderness.   Musculoskeletal:     Cervical back: Neck supple.     Right lower leg: Edema present.     Left lower leg: Edema present.     Comments:  Left lower extremity: 4+ pitting edema: pain in the calf care; no pedal pulse; no redness or warmth present.    Lymphadenopathy:     Cervical: No cervical adenopathy.  Skin:    General: Skin is warm and dry.  Neurological:     Mental Status: She is alert. Mental status is at baseline.  Psychiatric:        Mood and Affect: Mood normal.       ASSESSMENT/ PLAN:  TODAY  Acute deep vein thrombosis (DVT) of femoral vein of left extremity.  PAF (paroxsymal atrial fibrillation) Psychosis in elderly Chronic anticoagulation  For INR 1.2 will begin coumadin 6 mg daily and will repeat INR on 11-19-23.  Will increase abilify  to 10 mg daily  Will continue to monitor her status.    Barnie Seip NP [facility]  call [phone]      [1]  Allergies Allergen Reactions   Ticlid [Ticlopidine] Shortness Of Breath   Nsaids Other (See Comments)    Bleeding ulcer   Prednisone Other (See Comments)    Makes her stomach hurt--knows this isn't an allergy but doesn't want to take it    Trazodone And Nefazodone      hjallucinations   Monistat [Miconazole] Swelling and Rash   Myrbetriq  [Mirabegron  Er] Rash    See 02/27/2023   Sulfa Antibiotics Rash   "

## 2024-11-16 ENCOUNTER — Encounter: Payer: Self-pay | Admitting: Adult Health

## 2024-11-18 ENCOUNTER — Telehealth: Payer: Self-pay

## 2024-11-18 ENCOUNTER — Other Ambulatory Visit (HOSPITAL_COMMUNITY)
Admission: RE | Admit: 2024-11-18 | Discharge: 2024-11-18 | Disposition: A | Discharge status: Home / Self Care | Source: Skilled Nursing Facility | Attending: Adult Health | Admitting: Adult Health

## 2024-11-18 ENCOUNTER — Encounter: Payer: Self-pay | Admitting: Adult Health

## 2024-11-18 ENCOUNTER — Non-Acute Institutional Stay (SKILLED_NURSING_FACILITY): Payer: Self-pay | Admitting: Adult Health

## 2024-11-18 DIAGNOSIS — R791 Abnormal coagulation profile: Secondary | ICD-10-CM | POA: Insufficient documentation

## 2024-11-18 DIAGNOSIS — G43909 Migraine, unspecified, not intractable, without status migrainosus: Secondary | ICD-10-CM

## 2024-11-18 DIAGNOSIS — I480 Paroxysmal atrial fibrillation: Secondary | ICD-10-CM

## 2024-11-18 DIAGNOSIS — I82412 Acute embolism and thrombosis of left femoral vein: Secondary | ICD-10-CM

## 2024-11-18 LAB — PROTIME-INR
INR: 6.6 (ref 0.8–1.2)
Prothrombin Time: 60 s — ABNORMAL HIGH (ref 11.4–15.2)

## 2024-11-18 NOTE — Telephone Encounter (Signed)
 Copied from CRM [id number]. Topic: Clinical - Medication Question >> Nov 18, 2024  2:41 PM Chiquita SQUIBB wrote: Reason for CRM: Patients pharmacy is calling in stating that the patient previously had a prescription for warfarin (COUMADIN) 6 MG tablet but then received one today for 5 MG and would like to make sure this is correct. Please contact the pharmacy at  [phone]. Please confirm and I can let pharmacy know which is correct.

## 2024-11-18 NOTE — Progress Notes (Unsigned)
 " Location:  [facility] Room Number: 101 Place of Service:  SNF (31)   CODE STATUS: full   Allergies[1]  Chief Complaint  Patient presents with   Medical Management of Chronic Issues        Long standing persistent atrial fibrillation: DVT: left leg: Migraine without status migrainosus, non intractable unspecified type: Chronic pain syndrome    HPI:  She is a 79 y.o. long term resident of this facility being seen for the management of her chronic illnesses: Long standing persistent atrial fibrillation: DVT: left leg: Migraine without status migrainosus, non intractable unspecified type: Chronic pain syndrome. She is now on long term coumadin therapy for her atrial fib when she failed eliquis  therapy and has a dvt in her left leg. Her INR today is 6.6 and is taking 6 mg daily. She has had a slight decrease in her lower extremity edema.    Past Medical History:  Diagnosis Date   Anemia    Arthritis    Asthma    Atrial fibrillation (HCC)    COVID-19 08/05/2022   Depression    Elevated C-reactive protein (CRP) 06/27/2023   H/O head injury    Hearing loss    Heart murmur    can be heard at times, Doctor said not to worry   History of kidney stones    History of stomach ulcers    Migraine    Pneumonia    Psittacosis    Stroke (HCC)    Weakness of neck 03/02/2013    Past Surgical History:  Procedure Laterality Date   ANKLE FRACTURE SURGERY Left    APPENDECTOMY     BREAST LUMPECTOMY     CESAREAN SECTION     x 3   I & D KNEE WITH POLY EXCHANGE Left 08/16/2022   Procedure: KNEE POLY EXCHANGE;  Surgeon: Cristy Bonner DASEN, MD;  Location: [facility] OR;  Service: Orthopedics;  Laterality: Left;   KNEE ARTHROSCOPY WITH PATELLAR TENDON REPAIR Left 08/16/2022   Procedure: IRRIGATION AND DEBRIDEMENT LEFT KNEE REVISION WITH PATELLAR TENDON REPAIR and poly exchange;  Surgeon: Cristy Bonner DASEN, MD;  Location: [facility] OR;  Service: Orthopedics;  Laterality: Left;   PARTIAL  HYSTERECTOMY     ROTATOR CUFF REPAIR Right    TOTAL KNEE ARTHROPLASTY Left 07/22/2022   Procedure: TOTAL KNEE ARTHROPLASTY;  Surgeon: Edna Toribio LABOR, MD;  Location: [facility];  Service: Orthopedics;  Laterality: Left;    Social History   Socioeconomic History   Marital status: Married    Spouse name: Not on file   Number of children: 3   Years of education: Not on file   Highest education level: Not on file  Occupational History   Occupation: retired  Tobacco Use   Smoking status: Never   Smokeless tobacco: Never  Vaping Use   Vaping status: Never Used  Substance and Sexual Activity   Alcohol  use: Not Currently    Comment: occ beer   Drug use: No   Sexual activity: Not Currently    Birth control/protection: Surgical    Comment: hyst  Other Topics Concern   Not on file  Social History Narrative   Not on file   Social Drivers of Health   Tobacco Use: Low Risk (11/18/2024)   Patient History    Smoking Tobacco Use: Never    Smokeless Tobacco Use: Never    Passive Exposure: Not on file  Financial Resource Strain: Not on file  Food Insecurity: No Food  Insecurity (08/15/2022)   Hunger Vital Sign    Worried About Running Out of Food in the Last Year: Never true    Ran Out of Food in the Last Year: Never true  Transportation Needs: No Transportation Needs (08/15/2022)   PRAPARE - Administrator, Civil Service (Medical): No    Lack of Transportation (Non-Medical): No  Physical Activity: Not on file  Stress: Not on file  Social Connections: Not on file  Intimate Partner Violence: Not At Risk (08/15/2022)   Humiliation, Afraid, Rape, and Kick questionnaire    Fear of Current or Ex-Partner: No    Emotionally Abused: No    Physically Abused: No    Sexually Abused: No  Depression (PHQ2-9): High Risk (09/23/2024)   Depression (PHQ2-9)    PHQ-2 Score: 17  Alcohol  Screen: Not on file  Housing: Low Risk (08/15/2022)   Housing    Last Housing Risk Score: 0   Utilities: Not At Risk (08/15/2022)   AHC Utilities    Threatened with loss of utilities: No  Health Literacy: Not on file   Family History  Problem Relation Age of Onset   Cancer Father    Heart disease Brother    Alcohol  abuse Brother    Depression Maternal Grandmother    Depression Grandchild    Breast cancer Daughter       VITAL SIGNS BP 122/74   Pulse 60   Temp 98.1 F (36.7 C)   Resp 18   Ht 5' (1.524 m)   Wt 157 lb 3.2 oz (71.3 kg)   SpO2 94%   BMI 30.70 kg/m   Outpatient Encounter Medications as of 11/18/2024  Medication Sig   acetaminophen  (TYLENOL ) 650 MG CR tablet Take 650 mg by mouth every 8 (eight) hours.   apixaban  (ELIQUIS ) 5 MG TABS tablet Take 1 tablet (5 mg total) by mouth 2 (two) times daily.   ARIPiprazole  (ABILIFY ) 10 MG tablet Take 10 mg by mouth daily.   busPIRone  (BUSPAR ) 10 MG tablet Take 10 mg by mouth 3 (three) times daily.   chlorhexidine  (HIBICLENS ) 4 % external liquid Apply topically every other day. topical, Once A Day Every Other Day, Clean between all toes to both feet daily with Hibiclens  and soft gauze every other day   cyanocobalamin  1000 MCG tablet Take 1,000 mcg by mouth daily.   diclofenac Sodium (VOLTAREN) 1 % GEL Apply 2 g topically 2 (two) times daily. Apply to both hands   DULoxetine  (CYMBALTA ) 60 MG capsule Take 40 mg by mouth daily.   eszopiclone  (LUNESTA ) 1 MG TABS tablet Take 1 tablet (1 mg total) by mouth at bedtime. Take immediately before bedtime   furosemide  (LASIX ) 20 MG tablet Take 20 mg by mouth.   isosorbide dinitrate (ISORDIL) 10 MG tablet Take 10 mg by mouth 2 (two) times daily.   modafinil  (PROVIGIL ) 100 MG tablet Take 1 tablet (100 mg total) by mouth daily.   oseltamivir (TAMIFLU) 75 MG capsule Take 75 mg by mouth at bedtime.   Polyethyl Glycol-Propyl Glycol (SYSTANE) 0.4-0.3 % SOLN Apply to eye 2 (two) times daily as needed.   rosuvastatin (CRESTOR) 5 MG tablet Take 5 mg by mouth at bedtime.   senna-docusate  (SENOKOT-S) 8.6-50 MG tablet Take 1 tablet by mouth once as needed for mild constipation.   SUMAtriptan  (IMITREX ) 100 MG tablet Take 100 mg by mouth every 2 (two) hours as needed for migraine or headache. May repeat in 2 hours if headache persists or  recurs.   tetrabenazine (XENAZINE) 25 MG tablet Take 50 mg by mouth 3 (three) times daily.   UNABLE TO FIND Diet: Regular   warfarin (COUMADIN) 6 MG tablet Take 6 mg by mouth daily.   No facility-administered encounter medications on file as of 11/18/2024.     SIGNIFICANT DIAGNOSTIC EXAMS  LABS REVIEWED PREVIOUS   12-11-23: wbc 5.0; hgb 11.9; hct 36.7; mcv 99.5 plt 181; hgb A1c 5.3; tsh 2.295 01-12-24: vitamin B12: 225; folate 10.7; tsh 2.081; iron 77 tibc 334 03-18-24: glucose 89; bun 23 creat 0.67; k+ 3.8; na++ 138; ca 9.3 gfr >60; protein 6.5 albumin 3.6; sed rate 10; vitamin B12: 1054; RA factor: <10; ANA neg   TODAY  11-12-24: inr 1.2 on 5 mg daily 11-18-24: INR 6.6 on 6 mg daily     Review of Systems  Constitutional:  Negative for malaise/fatigue.  Respiratory:  Negative for cough and shortness of breath.   Cardiovascular:  Negative for chest pain, palpitations and leg swelling.  Gastrointestinal:  Negative for abdominal pain, constipation and heartburn.  Musculoskeletal:  Negative for back pain, joint pain and myalgias.  Skin: Negative.   Neurological:  Positive for tremors. Negative for dizziness.  Psychiatric/Behavioral:  The patient is not nervous/anxious.     Physical Exam Constitutional:      General: She is not in acute distress.    Appearance: She is well-developed. She is not diaphoretic.  Neck:     Thyroid : No thyromegaly.  Cardiovascular:     Rate and Rhythm: Normal rate and regular rhythm.     Heart sounds: Normal heart sounds.  Pulmonary:     Effort: Pulmonary effort is normal. No respiratory distress.     Breath sounds: Normal breath sounds.  Abdominal:     General: Bowel sounds are normal. There is no distension.      Palpations: Abdomen is soft.     Tenderness: There is no abdominal tenderness.  Musculoskeletal:        General: Normal range of motion.     Cervical back: Neck supple.     Right lower leg: Edema present.     Left lower leg: Edema present.     Comments:  Left lower extremity: 3+ pitting edema: pain in the calf area; very faint pedal pulse; no redness or warmth present.  Lymphadenopathy:     Cervical: No cervical adenopathy.  Skin:    General: Skin is warm and dry.  Neurological:     Mental Status: She is alert. Mental status is at baseline.  Psychiatric:        Mood and Affect: Mood normal.      ASSESSMENT/ PLAN:  TODAY  Long standing persistent atrial fibrillation: DVT: left leg: she has failed eliquis  therapy: will be on long term coumadin therapy: for INR 6.6 will stop coumadin and will then begin 5 mg daily on 11-21-24 and will repeat coumadin on 11-22-24.  Will monitor her status. Her heart rate remains stable.   2. Migraine without status migrainosus, non intractable unspecified type: has prn imitrex ; has not used in the past several months.   3. Chronic pain syndrome: is on cymbalta  60 mg daily and tylenol  650 mg nightly   PREVIOUS   4. Bilateral lower extremity edema: will continue lasix  20 mg daily   5. Tardive dyskinesia: continues with movements and tremors; will continue tetrabenzine  50 mg twice daily and inderal 10 mg twice daily will monitor her status. Failed both ingrezza  and austeda  6. Somnolence: did not tolerate every other day dosing will continue provigil  100 mg daily  7.  Chronic insomnia: did not tolerate every other night dosing will continue lunesta  1 mg nightly   8. Chronic anemia: hgb 11.9; iron studies have been normal   9. Psychosis in elderly/major depression with psychotic features: will continue abilify  5 mg daily; cymbalta  60 mg daily and buspar  20 mg three times daily   10. Protein calorie malnutrition: is presently not on  supplements.   11. Vitamin B 12 deficiency: level 1054; will continue supplement 1000 mcg daily   12. Urinary incontinence: is without change; was unable to tolerate myrbetriq   13. Uncomplicated asthma unspecified asthma severity unspecified whether persistent: is off medications  14. Chronic constipation: will continue senna s twice daily  3. Vascular dementia without behavioral disturbance/neurocognitive deficit: weight is 159.8 pounds is not on aricept due to nightmares.    Barnie Seip NP [facility]   call [phone]      [1]  Allergies Allergen Reactions   Ticlid [Ticlopidine] Shortness Of Breath   Nsaids Other (See Comments)    Bleeding ulcer   Prednisone Other (See Comments)    Makes her stomach hurt--knows this isn't an allergy but doesn't want to take it    Trazodone And Nefazodone      hjallucinations   Monistat [Miconazole] Swelling and Rash   Myrbetriq  [Mirabegron  Er] Rash    See 02/27/2023   Sulfa Antibiotics Rash   "

## 2024-11-22 ENCOUNTER — Other Ambulatory Visit (HOSPITAL_COMMUNITY)
Admission: RE | Admit: 2024-11-22 | Discharge: 2024-11-22 | Disposition: A | Discharge status: Home / Self Care | Source: Skilled Nursing Facility | Attending: Adult Health | Admitting: Adult Health

## 2024-11-22 ENCOUNTER — Encounter: Payer: Self-pay | Admitting: Adult Health

## 2024-11-22 ENCOUNTER — Non-Acute Institutional Stay (SKILLED_NURSING_FACILITY): Payer: Self-pay | Admitting: Adult Health

## 2024-11-22 DIAGNOSIS — Z7901 Long term (current) use of anticoagulants: Secondary | ICD-10-CM | POA: Diagnosis not present

## 2024-11-22 DIAGNOSIS — I480 Paroxysmal atrial fibrillation: Secondary | ICD-10-CM | POA: Diagnosis not present

## 2024-11-22 DIAGNOSIS — R791 Abnormal coagulation profile: Secondary | ICD-10-CM | POA: Insufficient documentation

## 2024-11-22 DIAGNOSIS — I82512 Chronic embolism and thrombosis of left femoral vein: Secondary | ICD-10-CM

## 2024-11-22 LAB — PROTIME-INR
INR: 8.6 (ref 0.8–1.2)
Prothrombin Time: 74.4 s — ABNORMAL HIGH (ref 11.4–15.2)

## 2024-11-22 NOTE — Progress Notes (Unsigned)
 " Location:  [facility] Room Number: 101 Place of Service:  SNF (31)   CODE STATUS: full   Allergies[1]  Chief Complaint  Patient presents with   Acute Visit    INR management     HPI:  She is now on long term coumadin therapy for dvt after failing eliquis  therapy for atrial fibrillation. Her INR goal is 2.5-3.5. She is presently taking coumadin 5 mg daily with an INR of 8.6.  There are no reports of bleeding present and no reports of excessive bruising present.   Past Medical History:  Diagnosis Date   Anemia    Arthritis    Asthma    Atrial fibrillation (HCC)    COVID-19 08/05/2022   Depression    Elevated C-reactive protein (CRP) 06/27/2023   H/O head injury    Hearing loss    Heart murmur    can be heard at times, Doctor said not to worry   History of kidney stones    History of stomach ulcers    Migraine    Pneumonia    Psittacosis    Stroke (HCC)    Weakness of neck 03/02/2013    Past Surgical History:  Procedure Laterality Date   ANKLE FRACTURE SURGERY Left    APPENDECTOMY     BREAST LUMPECTOMY     CESAREAN SECTION     x 3   I & D KNEE WITH POLY EXCHANGE Left 08/16/2022   Procedure: KNEE POLY EXCHANGE;  Surgeon: Cristy Bonner DASEN, MD;  Location: [facility] OR;  Service: Orthopedics;  Laterality: Left;   KNEE ARTHROSCOPY WITH PATELLAR TENDON REPAIR Left 08/16/2022   Procedure: IRRIGATION AND DEBRIDEMENT LEFT KNEE REVISION WITH PATELLAR TENDON REPAIR and poly exchange;  Surgeon: Cristy Bonner DASEN, MD;  Location: [facility] OR;  Service: Orthopedics;  Laterality: Left;   PARTIAL HYSTERECTOMY     ROTATOR CUFF REPAIR Right    TOTAL KNEE ARTHROPLASTY Left 07/22/2022   Procedure: TOTAL KNEE ARTHROPLASTY;  Surgeon: Edna Toribio LABOR, MD;  Location: [facility];  Service: Orthopedics;  Laterality: Left;    Social History   Socioeconomic History   Marital status: Married    Spouse name: Not on file   Number of children: 3   Years of education: Not on file    Highest education level: Not on file  Occupational History   Occupation: retired  Tobacco Use   Smoking status: Never   Smokeless tobacco: Never  Vaping Use   Vaping status: Never Used  Substance and Sexual Activity   Alcohol  use: Not Currently    Comment: occ beer   Drug use: No   Sexual activity: Not Currently    Birth control/protection: Surgical    Comment: hyst  Other Topics Concern   Not on file  Social History Narrative   Not on file   Social Drivers of Health   Tobacco Use: Low Risk (11/22/2024)   Patient History    Smoking Tobacco Use: Never    Smokeless Tobacco Use: Never    Passive Exposure: Not on file  Financial Resource Strain: Not on file  Food Insecurity: No Food Insecurity (08/15/2022)   Hunger Vital Sign    Worried About Running Out of Food in the Last Year: Never true    Ran Out of Food in the Last Year: Never true  Transportation Needs: No Transportation Needs (08/15/2022)   PRAPARE - Transportation    Lack of Transportation (Medical): No    Lack of  Transportation (Non-Medical): No  Physical Activity: Not on file  Stress: Not on file  Social Connections: Not on file  Intimate Partner Violence: Not At Risk (08/15/2022)   Humiliation, Afraid, Rape, and Kick questionnaire    Fear of Current or Ex-Partner: No    Emotionally Abused: No    Physically Abused: No    Sexually Abused: No  Depression (PHQ2-9): High Risk (09/23/2024)   Depression (PHQ2-9)    PHQ-2 Score: 17  Alcohol  Screen: Not on file  Housing: Low Risk (08/15/2022)   Housing    Last Housing Risk Score: 0  Utilities: Not At Risk (08/15/2022)   AHC Utilities    Threatened with loss of utilities: No  Health Literacy: Not on file   Family History  Problem Relation Age of Onset   Cancer Father    Heart disease Brother    Alcohol  abuse Brother    Depression Maternal Grandmother    Depression Grandchild    Breast cancer Daughter       VITAL SIGNS BP 128/70   Pulse 72   Temp 97.8 F  (36.6 C)   Resp 18   Ht 5' (1.524 m)   Wt 157 lb 3.2 oz (71.3 kg)   SpO2 100%   BMI 30.70 kg/m   Outpatient Encounter Medications as of 11/22/2024  Medication Sig   acetaminophen  (TYLENOL ) 650 MG CR tablet Take 650 mg by mouth every 8 (eight) hours.   ARIPiprazole  (ABILIFY ) 10 MG tablet Take 10 mg by mouth daily.   busPIRone  (BUSPAR ) 10 MG tablet Take 10 mg by mouth 3 (three) times daily.   chlorhexidine  (HIBICLENS ) 4 % external liquid Apply topically every other day. topical, Once A Day Every Other Day, Clean between all toes to both feet daily with Hibiclens  and soft gauze every other day   cyanocobalamin  1000 MCG tablet Take 1,000 mcg by mouth daily.   diclofenac Sodium (VOLTAREN) 1 % GEL Apply 2 g topically 2 (two) times daily. Apply to both hands   DULoxetine  (CYMBALTA ) 60 MG capsule Take 40 mg by mouth daily.   eszopiclone  (LUNESTA ) 1 MG TABS tablet Take 1 tablet (1 mg total) by mouth at bedtime. Take immediately before bedtime   furosemide  (LASIX ) 20 MG tablet Take 20 mg by mouth.   isosorbide dinitrate (ISORDIL) 10 MG tablet Take 10 mg by mouth 2 (two) times daily.   modafinil  (PROVIGIL ) 100 MG tablet Take 1 tablet (100 mg total) by mouth daily.   oseltamivir (TAMIFLU) 75 MG capsule Take 75 mg by mouth at bedtime.   Polyethyl Glycol-Propyl Glycol (SYSTANE) 0.4-0.3 % SOLN Apply to eye 2 (two) times daily as needed.   rosuvastatin (CRESTOR) 5 MG tablet Take 5 mg by mouth at bedtime.   senna-docusate (SENOKOT-S) 8.6-50 MG tablet Take 1 tablet by mouth once as needed for mild constipation.   SUMAtriptan  (IMITREX ) 100 MG tablet Take 100 mg by mouth every 2 (two) hours as needed for migraine or headache. May repeat in 2 hours if headache persists or recurs.   tetrabenazine (XENAZINE) 25 MG tablet Take 50 mg by mouth 3 (three) times daily.   UNABLE TO FIND Diet: Regular   warfarin (COUMADIN) 5 MG tablet Take 5 mg by mouth daily.   No facility-administered encounter medications on file  as of 11/22/2024.     SIGNIFICANT DIAGNOSTIC EXAMS  LABS REVIEWED PREVIOUS   12-11-23: wbc 5.0; hgb 11.9; hct 36.7; mcv 99.5 plt 181; hgb A1c 5.3; tsh 2.295 01-12-24: vitamin B12:  225; folate 10.7; tsh 2.081; iron 77 tibc 334 03-18-24: glucose 89; bun 23 creat 0.67; k+ 3.8; na++ 138; ca 9.3 gfr >60; protein 6.5 albumin 3.6; sed rate 10; vitamin B12: 1054; RA factor: <10; ANA neg   TODAY  11-12-24: inr 1.2 on 5 mg daily 11-18-24: INR 6.6 on 6 mg daily   11-22-24: INR 8.6 on 6 mg daily    Review of Systems  Constitutional:  Negative for malaise/fatigue.  Respiratory:  Negative for cough and shortness of breath.   Cardiovascular:  Positive for leg swelling. Negative for chest pain and palpitations.  Gastrointestinal:  Negative for abdominal pain, constipation and heartburn.  Musculoskeletal:  Negative for back pain, joint pain and myalgias.  Skin: Negative.   Neurological:  Positive for tremors. Negative for dizziness.  Psychiatric/Behavioral:  The patient is nervous/anxious.     Physical Exam Constitutional:      General: She is not in acute distress.    Appearance: She is well-developed. She is not diaphoretic.  Neck:     Thyroid : No thyromegaly.  Cardiovascular:     Rate and Rhythm: Normal rate and regular rhythm.     Heart sounds: Normal heart sounds.  Pulmonary:     Effort: Pulmonary effort is normal. No respiratory distress.     Breath sounds: Normal breath sounds.  Abdominal:     General: Bowel sounds are normal. There is no distension.     Palpations: Abdomen is soft.     Tenderness: There is no abdominal tenderness.  Musculoskeletal:        General: Normal range of motion.     Cervical back: Neck supple.     Right lower leg: Edema present.     Left lower leg: Edema present.     Comments: Left lower extremity: 3+ pitting edema: pain in the calf area; very faint pedal pulse; no redness or warmth present.  Lymphadenopathy:     Cervical: No cervical adenopathy.  Skin:     General: Skin is warm and dry.  Neurological:     Mental Status: She is alert. Mental status is at baseline.     Comments: SLUMS 18/30  Psychiatric:        Mood and Affect: Mood normal.      ASSESSMENT/ PLAN:  TODAY  Paroxsymal atrial fibrillation Chronic dvt left lower extremity Chronic anticoagulation  For INR 8.6 will stop coumadin and will repeat INR On 11-24-24    Barnie Seip NP [facility]   call [phone]      [1]  Allergies Allergen Reactions   Ticlid [Ticlopidine] Shortness Of Breath   Nsaids Other (See Comments)    Bleeding ulcer   Prednisone Other (See Comments)    Makes her stomach hurt--knows this isn't an allergy but doesn't want to take it    Trazodone And Nefazodone      hjallucinations   Monistat [Miconazole] Swelling and Rash   Myrbetriq  [Mirabegron  Er] Rash    See 02/27/2023   Sulfa Antibiotics Rash   "

## 2024-11-24 ENCOUNTER — Other Ambulatory Visit: Payer: Self-pay | Admitting: Adult Health

## 2024-11-24 ENCOUNTER — Other Ambulatory Visit (HOSPITAL_COMMUNITY)
Admission: RE | Admit: 2024-11-24 | Discharge: 2024-11-24 | Disposition: A | Discharge status: Home / Self Care | Source: Skilled Nursing Facility | Attending: Adult Health | Admitting: Adult Health

## 2024-11-24 ENCOUNTER — Encounter: Payer: Self-pay | Admitting: Adult Health

## 2024-11-24 ENCOUNTER — Non-Acute Institutional Stay (SKILLED_NURSING_FACILITY): Payer: Self-pay | Admitting: Adult Health

## 2024-11-24 DIAGNOSIS — I82412 Acute embolism and thrombosis of left femoral vein: Secondary | ICD-10-CM

## 2024-11-24 DIAGNOSIS — I82401 Acute embolism and thrombosis of unspecified deep veins of right lower extremity: Secondary | ICD-10-CM | POA: Diagnosis present

## 2024-11-24 DIAGNOSIS — Z7901 Long term (current) use of anticoagulants: Secondary | ICD-10-CM | POA: Diagnosis not present

## 2024-11-24 DIAGNOSIS — I480 Paroxysmal atrial fibrillation: Secondary | ICD-10-CM

## 2024-11-24 DIAGNOSIS — I82509 Chronic embolism and thrombosis of unspecified deep veins of unspecified lower extremity: Secondary | ICD-10-CM | POA: Insufficient documentation

## 2024-11-24 LAB — PROTIME-INR
INR: 3.8 — ABNORMAL HIGH (ref 0.8–1.2)
Prothrombin Time: 39.2 s — ABNORMAL HIGH (ref 11.4–15.2)

## 2024-11-24 MED ORDER — MODAFINIL 100 MG PO TABS: 100.0000 mg | ORAL_TABLET | Freq: Every day | ORAL | 0 refills | Status: AC

## 2024-11-24 MED ORDER — ESZOPICLONE 1 MG PO TABS: 1.0000 mg | ORAL_TABLET | Freq: Every day | ORAL | 0 refills | Status: AC

## 2024-11-24 NOTE — Progress Notes (Signed)
 " Location:  [facility] Room Number: 53 W Place of Service:  SNF (31)   CODE STATUS: Full Code   Allergies[1]  Chief Complaint  Patient presents with   INR Management    HPI:  She is on long term coumadin therapy after failing eliquis  therapy for afib. She does have a dvt in her left lower extremity. Her INR goal is 2.5-3.5. Her current INR is 3.8. her coumadin has been placed on hold. There are no reports of bleeding present. Her heart rate remains stable.   Past Medical History:  Diagnosis Date   Anemia    Arthritis    Asthma    Atrial fibrillation (HCC)    COVID-19 08/05/2022   Depression    Elevated C-reactive protein (CRP) 06/27/2023   H/O head injury    Hearing loss    Heart murmur    can be heard at times, Doctor said not to worry   History of kidney stones    History of stomach ulcers    Migraine    Pneumonia    Psittacosis    Stroke (HCC)    Weakness of neck 03/02/2013    Past Surgical History:  Procedure Laterality Date   ANKLE FRACTURE SURGERY Left    APPENDECTOMY     BREAST LUMPECTOMY     CESAREAN SECTION     x 3   I & D KNEE WITH POLY EXCHANGE Left 08/16/2022   Procedure: KNEE POLY EXCHANGE;  Surgeon: Cristy Bonner DASEN, MD;  Location: [facility] OR;  Service: Orthopedics;  Laterality: Left;   KNEE ARTHROSCOPY WITH PATELLAR TENDON REPAIR Left 08/16/2022   Procedure: IRRIGATION AND DEBRIDEMENT LEFT KNEE REVISION WITH PATELLAR TENDON REPAIR and poly exchange;  Surgeon: Cristy Bonner DASEN, MD;  Location: [facility] OR;  Service: Orthopedics;  Laterality: Left;   PARTIAL HYSTERECTOMY     ROTATOR CUFF REPAIR Right    TOTAL KNEE ARTHROPLASTY Left 07/22/2022   Procedure: TOTAL KNEE ARTHROPLASTY;  Surgeon: Edna Toribio LABOR, MD;  Location: [facility] ORS;  Service: Orthopedics;  Laterality: Left;    Social History   Socioeconomic History   Marital status: Married    Spouse name: Not on file   Number of children: 3   Years of education: Not on file   Highest  education level: Not on file  Occupational History   Occupation: retired  Tobacco Use   Smoking status: Never   Smokeless tobacco: Never  Vaping Use   Vaping status: Never Used  Substance and Sexual Activity   Alcohol  use: Not Currently    Comment: occ beer   Drug use: No   Sexual activity: Not Currently    Birth control/protection: Surgical    Comment: hyst  Other Topics Concern   Not on file  Social History Narrative   Not on file   Social Drivers of Health   Tobacco Use: Low Risk (11/24/2024)   Patient History    Smoking Tobacco Use: Never    Smokeless Tobacco Use: Never    Passive Exposure: Not on file  Financial Resource Strain: Not on file  Food Insecurity: No Food Insecurity (08/15/2022)   Hunger Vital Sign    Worried About Running Out of Food in the Last Year: Never true    Ran Out of Food in the Last Year: Never true  Transportation Needs: No Transportation Needs (08/15/2022)   PRAPARE - Administrator, Civil Service (Medical): No    Lack of Transportation (Non-Medical):  No  Physical Activity: Not on file  Stress: Not on file  Social Connections: Not on file  Intimate Partner Violence: Not At Risk (08/15/2022)   Humiliation, Afraid, Rape, and Kick questionnaire    Fear of Current or Ex-Partner: No    Emotionally Abused: No    Physically Abused: No    Sexually Abused: No  Depression (PHQ2-9): High Risk (09/23/2024)   Depression (PHQ2-9)    PHQ-2 Score: 17  Alcohol  Screen: Not on file  Housing: Low Risk (08/15/2022)   Housing    Last Housing Risk Score: 0  Utilities: Not At Risk (08/15/2022)   AHC Utilities    Threatened with loss of utilities: No  Health Literacy: Not on file   Family History  Problem Relation Age of Onset   Cancer Father    Heart disease Brother    Alcohol  abuse Brother    Depression Maternal Grandmother    Depression Grandchild    Breast cancer Daughter       VITAL SIGNS BP 128/70   Pulse 72   Temp (!) 97.5 F  (36.4 C)   Resp 18   Ht 5' (1.524 m)   Wt 157 lb 3.2 oz (71.3 kg)   SpO2 100%   BMI 30.70 kg/m   Outpatient Encounter Medications as of 11/24/2024  Medication Sig   acetaminophen  (TYLENOL ) 650 MG CR tablet Take 650 mg by mouth every 8 (eight) hours.   ARIPiprazole  (ABILIFY ) 10 MG tablet Take 10 mg by mouth daily.   busPIRone  (BUSPAR ) 10 MG tablet Take 10 mg by mouth 3 (three) times daily.   chlorhexidine  (HIBICLENS ) 4 % external liquid Apply topically every other day. topical, Once A Day Every Other Day, Clean between all toes to both feet daily with Hibiclens  and soft gauze every other day   cyanocobalamin  1000 MCG tablet Take 1,000 mcg by mouth daily.   diclofenac Sodium (VOLTAREN) 1 % GEL Apply 2 g topically 2 (two) times daily. Apply to both hands   DULoxetine  (CYMBALTA ) 60 MG capsule Take 40 mg by mouth daily.   eszopiclone  (LUNESTA ) 1 MG TABS tablet Take 1 tablet (1 mg total) by mouth at bedtime. Take immediately before bedtime   furosemide  (LASIX ) 20 MG tablet Take 20 mg by mouth.   isosorbide dinitrate (ISORDIL) 10 MG tablet Take 10 mg by mouth 2 (two) times daily.   modafinil  (PROVIGIL ) 100 MG tablet Take 1 tablet (100 mg total) by mouth daily.   Polyethyl Glycol-Propyl Glycol (SYSTANE) 0.4-0.3 % SOLN Apply to eye 2 (two) times daily as needed.   rosuvastatin (CRESTOR) 5 MG tablet Take 5 mg by mouth at bedtime.   senna-docusate (SENOKOT-S) 8.6-50 MG tablet Take 1 tablet by mouth once as needed for mild constipation.   SUMAtriptan  (IMITREX ) 100 MG tablet Take 100 mg by mouth every 2 (two) hours as needed for migraine or headache. May repeat in 2 hours if headache persists or recurs.   tetrabenazine (XENAZINE) 25 MG tablet Take 50 mg by mouth 3 (three) times daily.   UNABLE TO FIND Diet: Regular   oseltamivir (TAMIFLU) 75 MG capsule Take 75 mg by mouth at bedtime. (Patient not taking: Reported on 11/24/2024)   warfarin (COUMADIN) 5 MG tablet Take 5 mg by mouth daily. (Patient not  taking: Reported on 11/24/2024)   No facility-administered encounter medications on file as of 11/24/2024.     SIGNIFICANT DIAGNOSTIC EXAMS  LABS REVIEWED PREVIOUS   12-11-23: wbc 5.0; hgb 11.9; hct 36.7; mcv  99.5 plt 181; hgb A1c 5.3; tsh 2.295 01-12-24: vitamin B12: 225; folate 10.7; tsh 2.081; iron 77 tibc 334 03-18-24: glucose 89; bun 23 creat 0.67; k+ 3.8; na++ 138; ca 9.3 gfr >60; protein 6.5 albumin 3.6; sed rate 10; vitamin B12: 1054; RA factor: <10; ANA neg   TODAY  11-12-24: inr 1.2 on 5 mg daily 11-18-24: INR 6.6 on 6 mg daily   11-22-24: INR 8.6 on 6 mg daily  11-24-24: INR 3.8 5 mg on hold   Review of Systems  Constitutional:  Negative for malaise/fatigue.  Respiratory:  Negative for cough and shortness of breath.   Cardiovascular:  Negative for chest pain, palpitations and leg swelling.  Gastrointestinal:  Negative for abdominal pain, constipation and heartburn.  Musculoskeletal:  Negative for back pain, joint pain and myalgias.  Skin: Negative.   Neurological:  Negative for dizziness.  Psychiatric/Behavioral:  The patient is not nervous/anxious.     Physical Exam Constitutional:      General: She is not in acute distress.    Appearance: She is well-developed. She is not diaphoretic.  Neck:     Thyroid : No thyromegaly.  Cardiovascular:     Rate and Rhythm: Normal rate and regular rhythm.     Heart sounds: Normal heart sounds.  Pulmonary:     Effort: Pulmonary effort is normal. No respiratory distress.     Breath sounds: Normal breath sounds.  Abdominal:     General: Bowel sounds are normal. There is no distension.     Palpations: Abdomen is soft.     Tenderness: There is no abdominal tenderness.  Musculoskeletal:     Cervical back: Neck supple.     Right lower leg: No edema.     Left lower leg: Edema present.     Comments: Left lower extremity: 2-3+ pitting edema:  Lymphadenopathy:     Cervical: No cervical adenopathy.  Skin:    General: Skin is warm and dry.   Neurological:     Mental Status: She is alert. Mental status is at baseline.  Psychiatric:        Mood and Affect: Mood normal.       ASSESSMENT/ PLAN:  TODAY  Acute deep vein thrombosis (DVT) of femoral vein of left lower extremity 2. Paroxysmal atrial fibrillation  For INR 3.8 will hold coumadin and will repeat INR on 11-26-24.    Barnie Seip NP [facility]   call [phone]      [1]  Allergies Allergen Reactions   Ticlid [Ticlopidine] Shortness Of Breath   Nsaids Other (See Comments)    Bleeding ulcer   Prednisone Other (See Comments)    Makes her stomach hurt--knows this isn't an allergy but doesn't want to take it    Trazodone And Nefazodone      hjallucinations   Monistat [Miconazole] Swelling and Rash   Myrbetriq  [Mirabegron  Er] Rash    See 02/27/2023   Sulfa Antibiotics Rash   "

## 2024-11-26 ENCOUNTER — Other Ambulatory Visit (HOSPITAL_COMMUNITY)
Admission: RE | Admit: 2024-11-26 | Discharge: 2024-11-26 | Disposition: A | Discharge status: Home / Self Care | Source: Skilled Nursing Facility | Attending: Adult Health | Admitting: Adult Health

## 2024-11-26 ENCOUNTER — Encounter: Payer: Self-pay | Admitting: Adult Health

## 2024-11-26 DIAGNOSIS — I82402 Acute embolism and thrombosis of unspecified deep veins of left lower extremity: Secondary | ICD-10-CM | POA: Insufficient documentation

## 2024-11-26 LAB — PROTIME-INR
INR: 2 — ABNORMAL HIGH (ref 0.8–1.2)
Prothrombin Time: 24.1 s — ABNORMAL HIGH (ref 11.4–15.2)

## 2024-11-26 NOTE — Progress Notes (Unsigned)
 " Location:  [facility] Room Number: 101 Place of Service:  SNF (31)   CODE STATUS: full   Allergies[1]  Chief Complaint  Patient presents with   Acute Visit    INR Management     HPI:    Past Medical History:  Diagnosis Date   Anemia    Arthritis    Asthma    Atrial fibrillation (HCC)    COVID-19 08/05/2022   Depression    Elevated C-reactive protein (CRP) 06/27/2023   H/O head injury    Hearing loss    Heart murmur    can be heard at times, Doctor said not to worry   History of kidney stones    History of stomach ulcers    Migraine    Pneumonia    Psittacosis    Stroke (HCC)    Weakness of neck 03/02/2013    Past Surgical History:  Procedure Laterality Date   ANKLE FRACTURE SURGERY Left    APPENDECTOMY     BREAST LUMPECTOMY     CESAREAN SECTION     x 3   I & D KNEE WITH POLY EXCHANGE Left 08/16/2022   Procedure: KNEE POLY EXCHANGE;  Surgeon: Cristy Bonner DASEN, MD;  Location: [facility] OR;  Service: Orthopedics;  Laterality: Left;   KNEE ARTHROSCOPY WITH PATELLAR TENDON REPAIR Left 08/16/2022   Procedure: IRRIGATION AND DEBRIDEMENT LEFT KNEE REVISION WITH PATELLAR TENDON REPAIR and poly exchange;  Surgeon: Cristy Bonner DASEN, MD;  Location: [facility] OR;  Service: Orthopedics;  Laterality: Left;   PARTIAL HYSTERECTOMY     ROTATOR CUFF REPAIR Right    TOTAL KNEE ARTHROPLASTY Left 07/22/2022   Procedure: TOTAL KNEE ARTHROPLASTY;  Surgeon: Edna Toribio LABOR, MD;  Location: [facility];  Service: Orthopedics;  Laterality: Left;    Social History   Socioeconomic History   Marital status: Married    Spouse name: Not on file   Number of children: 3   Years of education: Not on file   Highest education level: Not on file  Occupational History   Occupation: retired  Tobacco Use   Smoking status: Never   Smokeless tobacco: Never  Vaping Use   Vaping status: Never Used  Substance and Sexual Activity   Alcohol  use: Not Currently    Comment: occ beer   Drug  use: No   Sexual activity: Not Currently    Birth control/protection: Surgical    Comment: hyst  Other Topics Concern   Not on file  Social History Narrative   Not on file   Social Drivers of Health   Tobacco Use: Low Risk (11/26/2024)   Patient History    Smoking Tobacco Use: Never    Smokeless Tobacco Use: Never    Passive Exposure: Not on file  Financial Resource Strain: Not on file  Food Insecurity: No Food Insecurity (08/15/2022)   Hunger Vital Sign    Worried About Running Out of Food in the Last Year: Never true    Ran Out of Food in the Last Year: Never true  Transportation Needs: No Transportation Needs (08/15/2022)   PRAPARE - Administrator, Civil Service (Medical): No    Lack of Transportation (Non-Medical): No  Physical Activity: Not on file  Stress: Not on file  Social Connections: Not on file  Intimate Partner Violence: Not At Risk (08/15/2022)   Humiliation, Afraid, Rape, and Kick questionnaire    Fear of Current or Ex-Partner: No    Emotionally Abused:  No    Physically Abused: No    Sexually Abused: No  Depression (PHQ2-9): High Risk (09/23/2024)   Depression (PHQ2-9)    PHQ-2 Score: 17  Alcohol  Screen: Not on file  Housing: Low Risk (08/15/2022)   Housing    Last Housing Risk Score: 0  Utilities: Not At Risk (08/15/2022)   AHC Utilities    Threatened with loss of utilities: No  Health Literacy: Not on file   Family History  Problem Relation Age of Onset   Cancer Father    Heart disease Brother    Alcohol  abuse Brother    Depression Maternal Grandmother    Depression Grandchild    Breast cancer Daughter       VITAL SIGNS BP 128/70   Pulse 72   Temp (!) 97.5 F (36.4 C)   Resp 18   Ht 5' (1.524 m)   Wt 157 lb 3.2 oz (71.3 kg)   SpO2 100%   BMI 30.70 kg/m   Outpatient Encounter Medications as of 11/26/2024  Medication Sig   acetaminophen  (TYLENOL ) 650 MG CR tablet Take 650 mg by mouth every 8 (eight) hours.   ARIPiprazole   (ABILIFY ) 10 MG tablet Take 10 mg by mouth daily.   busPIRone  (BUSPAR ) 10 MG tablet Take 10 mg by mouth 3 (three) times daily.   chlorhexidine  (HIBICLENS ) 4 % external liquid Apply topically every other day. topical, Once A Day Every Other Day, Clean between all toes to both feet daily with Hibiclens  and soft gauze every other day   cyanocobalamin  1000 MCG tablet Take 1,000 mcg by mouth daily.   diclofenac Sodium (VOLTAREN) 1 % GEL Apply 2 g topically 2 (two) times daily. Apply to both hands   DULoxetine  (CYMBALTA ) 60 MG capsule Take 40 mg by mouth daily.   eszopiclone  (LUNESTA ) 1 MG TABS tablet Take 1 tablet (1 mg total) by mouth at bedtime. Take immediately before bedtime   furosemide  (LASIX ) 20 MG tablet Take 20 mg by mouth.   isosorbide dinitrate (ISORDIL) 10 MG tablet Take 10 mg by mouth 2 (two) times daily.   modafinil  (PROVIGIL ) 100 MG tablet Take 1 tablet (100 mg total) by mouth daily.   oseltamivir (TAMIFLU) 75 MG capsule Take 75 mg by mouth at bedtime. (Patient not taking: Reported on 11/24/2024)   Polyethyl Glycol-Propyl Glycol (SYSTANE) 0.4-0.3 % SOLN Apply to eye 2 (two) times daily as needed.   rosuvastatin (CRESTOR) 5 MG tablet Take 5 mg by mouth at bedtime.   senna-docusate (SENOKOT-S) 8.6-50 MG tablet Take 1 tablet by mouth once as needed for mild constipation.   SUMAtriptan  (IMITREX ) 100 MG tablet Take 100 mg by mouth every 2 (two) hours as needed for migraine or headache. May repeat in 2 hours if headache persists or recurs.   tetrabenazine (XENAZINE) 25 MG tablet Take 50 mg by mouth 3 (three) times daily.   UNABLE TO FIND Diet: Regular   warfarin (COUMADIN) 5 MG tablet Take 5 mg by mouth daily. (Patient not taking: Reported on 11/24/2024)   No facility-administered encounter medications on file as of 11/26/2024.     SIGNIFICANT DIAGNOSTIC EXAMS       ASSESSMENT/ PLAN:     Barnie Seip NP [facility]  Contact [phone] Monday through Friday 8am-  5pm  After hours call [phone]  This encounter was created in error - please disregard.    [1]  Allergies Allergen Reactions   Ticlid [Ticlopidine] Shortness Of Breath   Nsaids Other (See Comments)  Bleeding ulcer   Prednisone Other (See Comments)    Makes her stomach hurt--knows this isn't an allergy but doesn't want to take it    Trazodone And Nefazodone      hjallucinations   Monistat [Miconazole] Swelling and Rash   Myrbetriq  [Mirabegron  Er] Rash    See 02/27/2023   Sulfa Antibiotics Rash   "

## 2024-11-29 ENCOUNTER — Other Ambulatory Visit: Payer: Self-pay | Admitting: Adult Health

## 2024-11-30 ENCOUNTER — Non-Acute Institutional Stay (SKILLED_NURSING_FACILITY): Payer: Self-pay | Admitting: Adult Health

## 2024-11-30 ENCOUNTER — Encounter: Payer: Self-pay | Admitting: Adult Health

## 2024-11-30 DIAGNOSIS — F323 Major depressive disorder, single episode, severe with psychotic features: Secondary | ICD-10-CM

## 2024-11-30 DIAGNOSIS — R29818 Other symptoms and signs involving the nervous system: Secondary | ICD-10-CM | POA: Diagnosis not present

## 2024-11-30 DIAGNOSIS — R4189 Other symptoms and signs involving cognitive functions and awareness: Secondary | ICD-10-CM

## 2024-11-30 NOTE — Progress Notes (Signed)
 " Location:  [facility] Room Number: 101 Place of Service:  SNF (31)   CODE STATUS: full   Allergies[1]  Chief Complaint  Patient presents with   Acute Visit    Mood state     HPI:  She is complaining of worsening mood state. She feels as though her depression and anxiety are not well managed. She did tell me that she has been in a strip club over the past several days. She is presently in OT working on self dressing. She states that she is not sleeping well at night.   Past Medical History:  Diagnosis Date   Anemia    Arthritis    Asthma    Atrial fibrillation (HCC)    COVID-19 08/05/2022   Depression    Elevated C-reactive protein (CRP) 06/27/2023   H/O head injury    Hearing loss    Heart murmur    can be heard at times, Doctor said not to worry   History of kidney stones    History of stomach ulcers    Migraine    Pneumonia    Psittacosis    Stroke (HCC)    Weakness of neck 03/02/2013    Past Surgical History:  Procedure Laterality Date   ANKLE FRACTURE SURGERY Left    APPENDECTOMY     BREAST LUMPECTOMY     CESAREAN SECTION     x 3   I & D KNEE WITH POLY EXCHANGE Left 08/16/2022   Procedure: KNEE POLY EXCHANGE;  Surgeon: Cristy Bonner DASEN, MD;  Location: [facility];  Service: Orthopedics;  Laterality: Left;   KNEE ARTHROSCOPY WITH PATELLAR TENDON REPAIR Left 08/16/2022   Procedure: IRRIGATION AND DEBRIDEMENT LEFT KNEE REVISION WITH PATELLAR TENDON REPAIR and poly exchange;  Surgeon: Cristy Bonner DASEN, MD;  Location: [facility] OR;  Service: Orthopedics;  Laterality: Left;   PARTIAL HYSTERECTOMY     ROTATOR CUFF REPAIR Right    TOTAL KNEE ARTHROPLASTY Left 07/22/2022   Procedure: TOTAL KNEE ARTHROPLASTY;  Surgeon: Edna Toribio LABOR, MD;  Location: [facility] ORS;  Service: Orthopedics;  Laterality: Left;    Social History   Socioeconomic History   Marital status: Married    Spouse name: Not on file   Number of children: 3   Years of education: Not on file    Highest education level: Not on file  Occupational History   Occupation: retired  Tobacco Use   Smoking status: Never   Smokeless tobacco: Never  Vaping Use   Vaping status: Never Used  Substance and Sexual Activity   Alcohol  use: Not Currently    Comment: occ beer   Drug use: No   Sexual activity: Not Currently    Birth control/protection: Surgical    Comment: hyst  Other Topics Concern   Not on file  Social History Narrative   Not on file   Social Drivers of Health   Tobacco Use: Low Risk (11/30/2024)   Patient History    Smoking Tobacco Use: Never    Smokeless Tobacco Use: Never    Passive Exposure: Not on file  Financial Resource Strain: Not on file  Food Insecurity: No Food Insecurity (08/15/2022)   Hunger Vital Sign    Worried About Running Out of Food in the Last Year: Never true    Ran Out of Food in the Last Year: Never true  Transportation Needs: No Transportation Needs (08/15/2022)   PRAPARE - Administrator, Civil Service (Medical): No  Lack of Transportation (Non-Medical): No  Physical Activity: Not on file  Stress: Not on file  Social Connections: Not on file  Intimate Partner Violence: Not At Risk (08/15/2022)   Humiliation, Afraid, Rape, and Kick questionnaire    Fear of Current or Ex-Partner: No    Emotionally Abused: No    Physically Abused: No    Sexually Abused: No  Depression (PHQ2-9): High Risk (09/23/2024)   Depression (PHQ2-9)    PHQ-2 Score: 17  Alcohol  Screen: Not on file  Housing: Low Risk (08/15/2022)   Housing    Last Housing Risk Score: 0  Utilities: Not At Risk (08/15/2022)   AHC Utilities    Threatened with loss of utilities: No  Health Literacy: Not on file   Family History  Problem Relation Age of Onset   Cancer Father    Heart disease Brother    Alcohol  abuse Brother    Depression Maternal Grandmother    Depression Grandchild    Breast cancer Daughter       VITAL SIGNS BP 130/72   Pulse 60   Temp 97.8  F (36.6 C)   Resp (!) 22   Ht 5' (1.524 m)   Wt 157 lb 3.2 oz (71.3 kg)   SpO2 94%   BMI 30.70 kg/m   Outpatient Encounter Medications as of 11/30/2024  Medication Sig   acetaminophen  (TYLENOL ) 650 MG CR tablet Take 650 mg by mouth every 8 (eight) hours.   ARIPiprazole  (ABILIFY ) 10 MG tablet Take 10 mg by mouth daily.   busPIRone  (BUSPAR ) 10 MG tablet Take 10 mg by mouth 3 (three) times daily.   chlorhexidine  (HIBICLENS ) 4 % external liquid Apply topically every other day. topical, Once A Day Every Other Day, Clean between all toes to both feet daily with Hibiclens  and soft gauze every other day   cyanocobalamin  1000 MCG tablet Take 1,000 mcg by mouth daily.   diclofenac Sodium (VOLTAREN) 1 % GEL Apply 2 g topically 2 (two) times daily. Apply to both hands   DULoxetine  (CYMBALTA ) 60 MG capsule Take 40 mg by mouth daily.   eszopiclone  (LUNESTA ) 1 MG TABS tablet Take 1 tablet (1 mg total) by mouth at bedtime. Take immediately before bedtime   furosemide  (LASIX ) 20 MG tablet Take 20 mg by mouth.   isosorbide dinitrate (ISORDIL) 10 MG tablet Take 10 mg by mouth 2 (two) times daily.   modafinil  (PROVIGIL ) 100 MG tablet Take 1 tablet (100 mg total) by mouth daily.   Polyethyl Glycol-Propyl Glycol (SYSTANE) 0.4-0.3 % SOLN Apply to eye 2 (two) times daily as needed.   rosuvastatin (CRESTOR) 5 MG tablet Take 5 mg by mouth at bedtime.   senna-docusate (SENOKOT-S) 8.6-50 MG tablet Take 1 tablet by mouth once as needed for mild constipation.   SUMAtriptan  (IMITREX ) 100 MG tablet Take 100 mg by mouth every 2 (two) hours as needed for migraine or headache. May repeat in 2 hours if headache persists or recurs.   tetrabenazine (XENAZINE) 25 MG tablet Take 50 mg by mouth 3 (three) times daily.   UNABLE TO FIND Diet: Regular   warfarin (COUMADIN) 4 MG tablet Take 4 mg by mouth daily.   No facility-administered encounter medications on file as of 11/30/2024.     SIGNIFICANT DIAGNOSTIC EXAMS  LABS  REVIEWED PREVIOUS   12-11-23: wbc 5.0; hgb 11.9; hct 36.7; mcv 99.5 plt 181; hgb A1c 5.3; tsh 2.295 01-12-24: vitamin B12: 225; folate 10.7; tsh 2.081; iron 77 tibc 334 03-18-24: glucose  89; bun 23 creat 0.67; k+ 3.8; na++ 138; ca 9.3 gfr >60; protein 6.5 albumin 3.6; sed rate 10; vitamin B12: 1054; RA factor: <10; ANA neg   TODAY  11-12-24: inr 1.2 on 5 mg daily 11-18-24: INR 6.6 on 6 mg daily   11-22-24: INR 8.6 on 6 mg daily  11-24-24: INR 3.8 5 mg on hold   Review of Systems  Constitutional:  Negative for malaise/fatigue.  Respiratory:  Negative for cough and shortness of breath.   Cardiovascular:  Negative for chest pain, palpitations and leg swelling.  Gastrointestinal:  Negative for abdominal pain, constipation and heartburn.  Musculoskeletal:  Negative for back pain, joint pain and myalgias.  Skin: Negative.   Neurological:  Positive for tremors. Negative for dizziness.  Psychiatric/Behavioral:  Positive for depression. Negative for suicidal ideas. The patient is nervous/anxious and has insomnia.     Physical Exam Constitutional:      General: She is not in acute distress.    Appearance: She is well-developed. She is not diaphoretic.  Neck:     Thyroid : No thyromegaly.  Cardiovascular:     Rate and Rhythm: Normal rate and regular rhythm.     Heart sounds: Normal heart sounds.  Pulmonary:     Effort: Pulmonary effort is normal. No respiratory distress.     Breath sounds: Normal breath sounds.  Abdominal:     General: Bowel sounds are normal. There is no distension.     Palpations: Abdomen is soft.     Tenderness: There is no abdominal tenderness.  Musculoskeletal:        General: Normal range of motion.     Cervical back: Neck supple.     Right lower leg: No edema.     Left lower leg: Edema present.  Lymphadenopathy:     Cervical: No cervical adenopathy.  Skin:    General: Skin is warm and dry.  Neurological:     Mental Status: She is alert. Mental status is at  baseline.     Comments: Has significant hand tremors and facial movements.   Psychiatric:        Mood and Affect: Mood normal.      ASSESSMENT/ PLAN:  TODAY  Neurocognitive deficits Major depression with psychotic features:   Will begin cymbalta  60 mg daily and buspar  15 mg three times daily; have spoken with therapy about being clear with working on her dressing.    Barnie Seip NP [facility]  call [phone]      [1]  Allergies Allergen Reactions   Ticlid [Ticlopidine] Shortness Of Breath   Nsaids Other (See Comments)    Bleeding ulcer   Prednisone Other (See Comments)    Makes her stomach hurt--knows this isn't an allergy but doesn't want to take it    Trazodone And Nefazodone      hjallucinations   Monistat [Miconazole] Swelling and Rash   Myrbetriq  [Mirabegron  Er] Rash    See 02/27/2023   Sulfa Antibiotics Rash   "

## 2024-12-01 ENCOUNTER — Other Ambulatory Visit (HOSPITAL_COMMUNITY)
Admission: RE | Admit: 2024-12-01 | Discharge: 2024-12-01 | Disposition: A | Discharge status: Home / Self Care | Source: Skilled Nursing Facility | Attending: Adult Health | Admitting: Adult Health

## 2024-12-01 DIAGNOSIS — Z79899 Other long term (current) drug therapy: Secondary | ICD-10-CM | POA: Diagnosis not present

## 2024-12-01 DIAGNOSIS — I87303 Chronic venous hypertension (idiopathic) without complications of bilateral lower extremity: Secondary | ICD-10-CM | POA: Insufficient documentation

## 2024-12-01 LAB — COMPREHENSIVE METABOLIC PANEL WITH GFR
ALT: 16 U/L (ref 0–44)
AST: 41 U/L (ref 15–41)
Albumin: 3.8 g/dL (ref 3.5–5.0)
Alkaline Phosphatase: 68 U/L (ref 38–126)
Anion gap: 10 (ref 5–15)
BUN: 23 mg/dL (ref 8–23)
CO2: 29 mmol/L (ref 22–32)
Calcium: 8.9 mg/dL (ref 8.9–10.3)
Chloride: 103 mmol/L (ref 98–111)
Creatinine, Ser: 0.86 mg/dL (ref 0.44–1.00)
GFR, Estimated: >60 mL/min
Glucose, Bld: 87 mg/dL (ref 70–99)
Potassium: 4.7 mmol/L (ref 3.5–5.1)
Sodium: 142 mmol/L (ref 135–145)
Total Bilirubin: 0.4 mg/dL (ref 0.0–1.2)
Total Protein: 6 g/dL — ABNORMAL LOW (ref 6.5–8.1)

## 2024-12-01 LAB — CBC WITH DIFFERENTIAL/PLATELET
Abs Immature Granulocytes: 0.02 K/uL (ref 0.00–0.07)
Basophils Absolute: 0 K/uL (ref 0.0–0.1)
Basophils Relative: 0 %
Eosinophils Absolute: 0.1 K/uL (ref 0.0–0.5)
Eosinophils Relative: 2 %
HCT: 31.4 % — ABNORMAL LOW (ref 36.0–46.0)
Hemoglobin: 9.7 g/dL — ABNORMAL LOW (ref 12.0–15.0)
Immature Granulocytes: 0 %
Lymphocytes Relative: 23 %
Lymphs Abs: 1.5 K/uL (ref 0.7–4.0)
MCH: 30.9 pg (ref 26.0–34.0)
MCHC: 30.9 g/dL (ref 30.0–36.0)
MCV: 100 fL (ref 80.0–100.0)
Monocytes Absolute: 0.6 K/uL (ref 0.1–1.0)
Monocytes Relative: 10 %
Neutro Abs: 4.2 K/uL (ref 1.7–7.7)
Neutrophils Relative %: 65 %
Platelets: 204 K/uL (ref 150–400)
RBC: 3.14 MIL/uL — ABNORMAL LOW (ref 3.87–5.11)
RDW: 13.4 % (ref 11.5–15.5)
WBC: 6.6 K/uL (ref 4.0–10.5)
nRBC: 0 % (ref 0.0–0.2)

## 2024-12-02 ENCOUNTER — Other Ambulatory Visit (HOSPITAL_COMMUNITY)
Admission: RE | Admit: 2024-12-02 | Discharge: 2024-12-02 | Disposition: A | Discharge status: Home / Self Care | Source: Skilled Nursing Facility | Attending: Adult Health | Admitting: Adult Health

## 2024-12-02 DIAGNOSIS — I82402 Acute embolism and thrombosis of unspecified deep veins of left lower extremity: Secondary | ICD-10-CM | POA: Insufficient documentation

## 2024-12-02 LAB — PROTIME-INR
INR: 4.3 (ref 0.8–1.2)
Prothrombin Time: 43.1 s — ABNORMAL HIGH (ref 11.4–15.2)

## 2024-12-08 ENCOUNTER — Other Ambulatory Visit (HOSPITAL_COMMUNITY)
Admission: RE | Admit: 2024-12-08 | Discharge: 2024-12-08 | Disposition: A | Discharge status: Home / Self Care | Source: Skilled Nursing Facility | Attending: Adult Health | Admitting: Adult Health

## 2024-12-08 DIAGNOSIS — I480 Paroxysmal atrial fibrillation: Secondary | ICD-10-CM | POA: Insufficient documentation

## 2024-12-08 LAB — PROTIME-INR
INR: 5.4 (ref 0.8–1.2)
Prothrombin Time: 51.4 s — ABNORMAL HIGH (ref 11.4–15.2)

## 2024-12-09 ENCOUNTER — Other Ambulatory Visit (HOSPITAL_COMMUNITY)
Admission: RE | Admit: 2024-12-09 | Discharge: 2024-12-09 | Disposition: A | Discharge status: Home / Self Care | Source: Skilled Nursing Facility | Attending: Internal Medicine | Admitting: Internal Medicine

## 2024-12-09 ENCOUNTER — Non-Acute Institutional Stay (SKILLED_NURSING_FACILITY): Payer: Self-pay | Admitting: Adult Health

## 2024-12-09 ENCOUNTER — Ambulatory Visit: Admitting: Professional Counselor

## 2024-12-09 DIAGNOSIS — F332 Major depressive disorder, recurrent severe without psychotic features: Secondary | ICD-10-CM

## 2024-12-09 DIAGNOSIS — I82512 Chronic embolism and thrombosis of left femoral vein: Secondary | ICD-10-CM | POA: Diagnosis not present

## 2024-12-09 DIAGNOSIS — Z7901 Long term (current) use of anticoagulants: Secondary | ICD-10-CM

## 2024-12-09 DIAGNOSIS — I82402 Acute embolism and thrombosis of unspecified deep veins of left lower extremity: Secondary | ICD-10-CM | POA: Insufficient documentation

## 2024-12-09 DIAGNOSIS — I480 Paroxysmal atrial fibrillation: Secondary | ICD-10-CM | POA: Diagnosis not present

## 2024-12-09 DIAGNOSIS — F411 Generalized anxiety disorder: Secondary | ICD-10-CM

## 2024-12-09 LAB — PROTIME-INR
INR: 5 (ref 0.8–1.2)
Prothrombin Time: 48.3 s — ABNORMAL HIGH (ref 11.4–15.2)

## 2024-12-09 NOTE — Progress Notes (Signed)
 " Location:  [facility] Room Number: 101 Place of Service:  SNF (31)   CODE STATUS: full   Allergies[1]  Chief Complaint  Patient presents with   Acute Visit    INR management     HPI:  Her INR today is 5.0 previous 5.2. Her INR goal is 2.5-3.5. she had been taking eliquis  for atrial fibrillation but failed that therapy with a  lower extremity dvt. Her coumadin is presently on hold. There are no reports of bleeding present. Does have some bruising present.   Past Medical History:  Diagnosis Date   Anemia    Arthritis    Asthma    Atrial fibrillation (HCC)    COVID-19 08/05/2022   Depression    Elevated C-reactive protein (CRP) 06/27/2023   H/O head injury    Hearing loss    Heart murmur    can be heard at times, Doctor said not to worry   History of kidney stones    History of stomach ulcers    Migraine    Pneumonia    Psittacosis    Stroke (HCC)    Weakness of neck 03/02/2013    Past Surgical History:  Procedure Laterality Date   ANKLE FRACTURE SURGERY Left    APPENDECTOMY     BREAST LUMPECTOMY     CESAREAN SECTION     x 3   I & D KNEE WITH POLY EXCHANGE Left 08/16/2022   Procedure: KNEE POLY EXCHANGE;  Surgeon: Cristy Bonner DASEN, MD;  Location: [facility] OR;  Service: Orthopedics;  Laterality: Left;   KNEE ARTHROSCOPY WITH PATELLAR TENDON REPAIR Left 08/16/2022   Procedure: IRRIGATION AND DEBRIDEMENT LEFT KNEE REVISION WITH PATELLAR TENDON REPAIR and poly exchange;  Surgeon: Cristy Bonner DASEN, MD;  Location: [facility] OR;  Service: Orthopedics;  Laterality: Left;   PARTIAL HYSTERECTOMY     ROTATOR CUFF REPAIR Right    TOTAL KNEE ARTHROPLASTY Left 07/22/2022   Procedure: TOTAL KNEE ARTHROPLASTY;  Surgeon: Edna Toribio LABOR, MD;  Location: [facility] ORS;  Service: Orthopedics;  Laterality: Left;    Social History   Socioeconomic History   Marital status: Married    Spouse name: Not on file   Number of children: 3   Years of education: Not on file   Highest  education level: Not on file  Occupational History   Occupation: retired  Tobacco Use   Smoking status: Never   Smokeless tobacco: Never  Vaping Use   Vaping status: Never Used  Substance and Sexual Activity   Alcohol  use: Not Currently    Comment: occ beer   Drug use: No   Sexual activity: Not Currently    Birth control/protection: Surgical    Comment: hyst  Other Topics Concern   Not on file  Social History Narrative   Not on file   Social Drivers of Health   Tobacco Use: Low Risk (11/30/2024)   Patient History    Smoking Tobacco Use: Never    Smokeless Tobacco Use: Never    Passive Exposure: Not on file  Financial Resource Strain: Not on file  Food Insecurity: No Food Insecurity (08/15/2022)   Hunger Vital Sign    Worried About Running Out of Food in the Last Year: Never true    Ran Out of Food in the Last Year: Never true  Transportation Needs: No Transportation Needs (08/15/2022)   PRAPARE - Administrator, Civil Service (Medical): No    Lack of Transportation (Non-Medical):  No  Physical Activity: Not on file  Stress: Not on file  Social Connections: Not on file  Intimate Partner Violence: Not At Risk (08/15/2022)   Humiliation, Afraid, Rape, and Kick questionnaire    Fear of Current or Ex-Partner: No    Emotionally Abused: No    Physically Abused: No    Sexually Abused: No  Depression (PHQ2-9): High Risk (09/23/2024)   Depression (PHQ2-9)    PHQ-2 Score: 17  Alcohol  Screen: Not on file  Housing: Low Risk (08/15/2022)   Housing    Last Housing Risk Score: 0  Utilities: Not At Risk (08/15/2022)   AHC Utilities    Threatened with loss of utilities: No  Health Literacy: Not on file   Family History  Problem Relation Age of Onset   Cancer Father    Heart disease Brother    Alcohol  abuse Brother    Depression Maternal Grandmother    Depression Grandchild    Breast cancer Daughter       VITAL SIGNS BP (!) 154/84   Pulse 87   Temp 98 F (36.7  C)   Resp (!) 22   Ht 5' (1.524 m)   Wt 157 lb 3.2 oz (71.3 kg)   SpO2 95%   BMI 30.70 kg/m   Outpatient Encounter Medications as of 12/09/2024  Medication Sig   acetaminophen  (TYLENOL ) 650 MG CR tablet Take 650 mg by mouth every 8 (eight) hours.   ARIPiprazole  (ABILIFY ) 10 MG tablet Take 10 mg by mouth daily.   busPIRone  (BUSPAR ) 15 MG tablet Take 15 mg by mouth 3 (three) times daily.   chlorhexidine  (HIBICLENS ) 4 % external liquid Apply topically every other day. topical, Once A Day Every Other Day, Clean between all toes to both feet daily with Hibiclens  and soft gauze every other day   cyanocobalamin  1000 MCG tablet Take 1,000 mcg by mouth daily.   diclofenac Sodium (VOLTAREN) 1 % GEL Apply 2 g topically 2 (two) times daily. Apply to both hands   DULoxetine  (CYMBALTA ) 60 MG capsule Take 60 mg by mouth daily.   eszopiclone  (LUNESTA ) 1 MG TABS tablet Take 1 tablet (1 mg total) by mouth at bedtime. Take immediately before bedtime   furosemide  (LASIX ) 20 MG tablet Take 20 mg by mouth.   isosorbide dinitrate (ISORDIL) 10 MG tablet Take 10 mg by mouth 2 (two) times daily.   modafinil  (PROVIGIL ) 100 MG tablet Take 1 tablet (100 mg total) by mouth daily.   Polyethyl Glycol-Propyl Glycol (SYSTANE) 0.4-0.3 % SOLN Apply to eye 2 (two) times daily as needed.   rosuvastatin (CRESTOR) 5 MG tablet Take 5 mg by mouth at bedtime.   senna-docusate (SENOKOT-S) 8.6-50 MG tablet Take 1 tablet by mouth once as needed for mild constipation.   SUMAtriptan  (IMITREX ) 100 MG tablet Take 100 mg by mouth every 2 (two) hours as needed for migraine or headache. May repeat in 2 hours if headache persists or recurs.   tetrabenazine (XENAZINE) 25 MG tablet Take 50 mg by mouth 3 (three) times daily.   UNABLE TO FIND Diet: Regular   warfarin (COUMADIN) 4 MG tablet Take 4 mg by mouth daily.   No facility-administered encounter medications on file as of 12/09/2024.     SIGNIFICANT DIAGNOSTIC EXAMS  LABS REVIEWED  PREVIOUS   12-11-23: wbc 5.0; hgb 11.9; hct 36.7; mcv 99.5 plt 181; hgb A1c 5.3; tsh 2.295 01-12-24: vitamin B12: 225; folate 10.7; tsh 2.081; iron 77 tibc 334 03-18-24: glucose 89; bun 23  creat 0.67; k+ 3.8; na++ 138; ca 9.3 gfr >60; protein 6.5 albumin 3.6; sed rate 10; vitamin B12: 1054; RA factor: <10; ANA neg   TODAY  11-12-24: inr 1.2 on 5 mg daily 11-18-24: INR 6.6 on 6 mg daily   11-22-24: INR 8.6 on 6 mg daily  11-24-24: INR 3.8 5 mg on hold 11-26-24: INR 2.0 11-30-24: wbc 6.6; hgb 9.7; hct 31.4; mcv 100; plt 204; glucose 87; bun 23; creat 0.86; k+ 4.7; na++ 142; ca 8.9  gfr >60; protein 6.0; albumin 3.8  12-02-24: INR 4.3 12-08-24: INR 5.4  12-09-24: INR 5.0   Review of Systems  Constitutional:  Negative for malaise/fatigue.  Respiratory:  Negative for cough and shortness of breath.   Cardiovascular:  Negative for chest pain, palpitations and leg swelling.  Gastrointestinal:  Negative for abdominal pain, constipation and heartburn.  Musculoskeletal:  Negative for back pain, joint pain and myalgias.  Skin: Negative.   Neurological:  Negative for dizziness.  Psychiatric/Behavioral:  The patient is not nervous/anxious.     Physical Exam Constitutional:      General: She is not in acute distress.    Appearance: She is well-developed. She is not diaphoretic.  Neck:     Thyroid : No thyromegaly.  Cardiovascular:     Rate and Rhythm: Normal rate and regular rhythm.     Heart sounds: Normal heart sounds.  Pulmonary:     Effort: Pulmonary effort is normal. No respiratory distress.     Breath sounds: Normal breath sounds.  Abdominal:     General: Bowel sounds are normal. There is no distension.     Palpations: Abdomen is soft.     Tenderness: There is no abdominal tenderness.  Musculoskeletal:        General: Normal range of motion.     Cervical back: Neck supple.     Right lower leg: No edema.     Left lower leg: No edema.  Lymphadenopathy:     Cervical: No cervical adenopathy.   Skin:    General: Skin is warm and dry.  Neurological:     Mental Status: She is alert. Mental status is at baseline.     Comments: Has significant hand tremors and facial movements.    Psychiatric:        Mood and Affect: Mood normal.      ASSESSMENT/ PLAN:  TODAY  Chronic deep vein thrombosis (DVT) of femoral vein of left lower extremity Paroxsymal atrial fibrillation Chronic anticoagulation   For INR 5.0 will stop 3 mg daily; will begin 2.5 mg on 12-12-24 and will repeat INR on 12-13-24.    Barnie Seip NP [facility]  call [phone]      [1]  Allergies Allergen Reactions   Ticlid [Ticlopidine] Shortness Of Breath   Nsaids Other (See Comments)    Bleeding ulcer   Prednisone Other (See Comments)    Makes her stomach hurt--knows this isn't an allergy but doesn't want to take it    Trazodone And Nefazodone      hjallucinations   Monistat [Miconazole] Swelling and Rash   Myrbetriq  [Mirabegron  Er] Rash    See 02/27/2023   Sulfa Antibiotics Rash   "

## 2024-12-10 ENCOUNTER — Encounter: Payer: Self-pay | Admitting: Professional Counselor

## 2024-12-10 NOTE — Progress Notes (Signed)
 "       [facility] Counselor/Therapist Progress Note  Patient ID: [name], MRN: [id number],    Date: 01.29.2026  Time Spent: 2:11 PM - 3:12 PM  Treatment Type: Family with patient  Patient presented to session with her husband.  Reported Symptoms: Sadness, worries, sense of isolation, life transition challenges, phase of life concerns, anxiousness, low mood, anhedonia, health concerns, trouble relaxing, fatigue, trouble concentrating, cognitive decline  Mental Status Exam:  Appearance:   Neat     Behavior:  Appropriate and Sharing  Motor:  Patient presented to session in a wheelchairwith spouse assisting  Speech/Language:   Patient clarity of speech sometimes challenged, patient thoughts to words slowed and brief  Affect:  Depressed  Mood:  anxious, constricted, depressed, and sad, worried  Thought process:  normal  Thought content:    WNL  Sensory/Perceptual disturbances:    WNL  Orientation:  oriented to person, place, time/date, and situation  Attention:  Good  Concentration:  Fair  Memory:  WNL  Fund of knowledge:   Challenging to assess  Insight:    Challenging to assess  Judgment:   Good  Impulse Control:  Good   Risk Assessment: Danger to Self:  No Self-injurious Behavior: No Danger to Others: No Duty to Warn:no Physical Aggression / Violence:No  Access to Firearms a concern: No  Gang Involvement:No   Subjective: Patient presented to session to address concerns of anxiety and depression.  Patient and patient's spouse reported minimal progress at this time.  Counselor experience difficulty assessing patient due to holistic challenges of patient expression due to medical concerns.  Patient memory based on questions asked of her, and ability to concentrate in session, and knowing where she was and purpose of visit discerned by counselor as intact at time of visit; counselor did observe difficulty for patient and answering questions and thinking things through and  verbalizing.  Patient spouse identified patient to have been dismissed from rehab because of lack of progress.  He identified patient having a hard time moving and difficulty in expressing herself.  He identified her as having been home for the holidays, and to be visiting her every day, however for her to continue to express desire to be at home, and to call if needed.  Patient's spouse identified thoughts that counselor practice psychiatry, to which counselor explained was not the case, however counselor expressed ability to provide coping skills help and emotional support.  Patient spouse indicated intention to think about continued care for counseling.  Patient spouse also identified thinking patient had an upcoming appointment with prior prescribing provider at practice, and counselor explained that provider was no longer with practice.  Counselor helped to facilitate referral, offering contact for gero-psychiatric provider in [place], and per follow-up, for provider (Dr. Syed Arfeen) in [place] per patient's spouse choice.  Counselor also recommended patient spouse resource [facility] where patient resides for additional referral options and regarding advocacy for increased patient psychiatric care onsite.  Interventions: Humanistic/Existential, Family Systems, and Referrals  Diagnosis:   ICD-10-CM   1. Severe episode of recurrent major depressive disorder, without psychotic features (HCC)  F33.2     2. Generalized anxiety disorder  F41.1       Plan: Follow-up to be determined; continue to build rapport, assess symptoms and history, discuss treatment plan and obtain consent.  Patient/patient's spouse short-term goal to follow-up regarding referrals as described above, and continue consideration regarding counseling needs.  Almarie ONEIDA Sprang, [facility]                   "

## 2024-12-11 ENCOUNTER — Other Ambulatory Visit (HOSPITAL_COMMUNITY)
Admission: RE | Admit: 2024-12-11 | Discharge: 2024-12-11 | Disposition: A | Discharge status: Home / Self Care | Source: Skilled Nursing Facility | Attending: Internal Medicine | Admitting: Internal Medicine

## 2024-12-11 DIAGNOSIS — I82402 Acute embolism and thrombosis of unspecified deep veins of left lower extremity: Secondary | ICD-10-CM | POA: Insufficient documentation

## 2024-12-11 LAB — CBC
HCT: 33.5 % — ABNORMAL LOW (ref 36.0–46.0)
Hemoglobin: 10.5 g/dL — ABNORMAL LOW (ref 12.0–15.0)
MCH: 31.2 pg (ref 26.0–34.0)
MCHC: 31.3 g/dL (ref 30.0–36.0)
MCV: 99.4 fL (ref 80.0–100.0)
Platelets: 242 10*3/uL (ref 150–400)
RBC: 3.37 MIL/uL — ABNORMAL LOW (ref 3.87–5.11)
RDW: 13.3 % (ref 11.5–15.5)
WBC: 7.5 10*3/uL (ref 4.0–10.5)
nRBC: 0 % (ref 0.0–0.2)

## 2024-12-11 LAB — PROTIME-INR
INR: 4.2 (ref 0.8–1.2)
Prothrombin Time: 42.2 s — ABNORMAL HIGH (ref 11.4–15.2)

## 2024-12-13 ENCOUNTER — Other Ambulatory Visit (HOSPITAL_COMMUNITY)
Admission: RE | Admit: 2024-12-13 | Discharge: 2024-12-13 | Disposition: A | Discharge status: Home / Self Care | Source: Skilled Nursing Facility | Attending: Internal Medicine | Admitting: Internal Medicine

## 2024-12-13 LAB — PROTIME-INR
INR: 5.2 (ref 0.8–1.2)
Prothrombin Time: 50.3 s — ABNORMAL HIGH (ref 11.4–15.2)

## 2024-12-16 ENCOUNTER — Non-Acute Institutional Stay (SKILLED_NURSING_FACILITY): Payer: Self-pay | Admitting: Adult Health

## 2024-12-16 DIAGNOSIS — R600 Localized edema: Secondary | ICD-10-CM

## 2024-12-16 NOTE — Progress Notes (Signed)
 " Location:  [facility] Room Number: 101 Place of Service:  SNF (31)   CODE STATUS: full   Allergies[1]  Chief Complaint  Patient presents with   Acute Visit    Bilateral lower extremity edema     HPI:  She has been experiencing bilateral lower extremity edema. She is on coumadin therapy for a left lower extremity dvt with atrial fibrillation. Her edema in her left leg is 3+ with right leg of 4+. She denies any coughing or shortness of breath present.   Past Medical History:  Diagnosis Date   Anemia    Arthritis    Asthma    Atrial fibrillation (HCC)    COVID-19 08/05/2022   Depression    Elevated C-reactive protein (CRP) 06/27/2023   H/O head injury    Hearing loss    Heart murmur    can be heard at times, Doctor said not to worry   History of kidney stones    History of stomach ulcers    Migraine    Pneumonia    Psittacosis    Stroke (HCC)    Weakness of neck 03/02/2013    Past Surgical History:  Procedure Laterality Date   ANKLE FRACTURE SURGERY Left    APPENDECTOMY     BREAST LUMPECTOMY     CESAREAN SECTION     x 3   I & D KNEE WITH POLY EXCHANGE Left 08/16/2022   Procedure: KNEE POLY EXCHANGE;  Surgeon: Cristy Bonner DASEN, MD;  Location: [facility] OR;  Service: Orthopedics;  Laterality: Left;   KNEE ARTHROSCOPY WITH PATELLAR TENDON REPAIR Left 08/16/2022   Procedure: IRRIGATION AND DEBRIDEMENT LEFT KNEE REVISION WITH PATELLAR TENDON REPAIR and poly exchange;  Surgeon: Cristy Bonner DASEN, MD;  Location: [facility] OR;  Service: Orthopedics;  Laterality: Left;   PARTIAL HYSTERECTOMY     ROTATOR CUFF REPAIR Right    TOTAL KNEE ARTHROPLASTY Left 07/22/2022   Procedure: TOTAL KNEE ARTHROPLASTY;  Surgeon: Edna Toribio LABOR, MD;  Location: [facility] ORS;  Service: Orthopedics;  Laterality: Left;    Social History   Socioeconomic History   Marital status: Married    Spouse name: Not on file   Number of children: 3   Years of education: Not on file   Highest  education level: Not on file  Occupational History   Occupation: retired  Tobacco Use   Smoking status: Never   Smokeless tobacco: Never  Vaping Use   Vaping status: Never Used  Substance and Sexual Activity   Alcohol  use: Not Currently    Comment: occ beer   Drug use: No   Sexual activity: Not Currently    Birth control/protection: Surgical    Comment: hyst  Other Topics Concern   Not on file  Social History Narrative   Not on file   Social Drivers of Health   Tobacco Use: Low Risk (12/10/2024)   Patient History    Smoking Tobacco Use: Never    Smokeless Tobacco Use: Never    Passive Exposure: Not on file  Financial Resource Strain: Not on file  Food Insecurity: No Food Insecurity (08/15/2022)   Hunger Vital Sign    Worried About Running Out of Food in the Last Year: Never true    Ran Out of Food in the Last Year: Never true  Transportation Needs: No Transportation Needs (08/15/2022)   PRAPARE - Administrator, Civil Service (Medical): No    Lack of Transportation (Non-Medical): No  Physical Activity: Not on file  Stress: Not on file  Social Connections: Not on file  Intimate Partner Violence: Not At Risk (08/15/2022)   Humiliation, Afraid, Rape, and Kick questionnaire    Fear of Current or Ex-Partner: No    Emotionally Abused: No    Physically Abused: No    Sexually Abused: No  Depression (PHQ2-9): High Risk (09/23/2024)   Depression (PHQ2-9)    PHQ-2 Score: 17  Alcohol  Screen: Not on file  Housing: Low Risk (08/15/2022)   Housing    Last Housing Risk Score: 0  Utilities: Not At Risk (08/15/2022)   AHC Utilities    Threatened with loss of utilities: No  Health Literacy: Not on file   Family History  Problem Relation Age of Onset   Cancer Father    Heart disease Brother    Alcohol  abuse Brother    Depression Maternal Grandmother    Depression Grandchild    Breast cancer Daughter       VITAL SIGNS BP 112/65   Pulse 78   Temp 98.6 F (37 C)    Resp 20   Ht 5' (1.524 m)   Wt 157 lb 3.2 oz (71.3 kg)   SpO2 97%   BMI 30.70 kg/m   Outpatient Encounter Medications as of 12/16/2024  Medication Sig   acetaminophen  (TYLENOL ) 650 MG CR tablet Take 650 mg by mouth every 8 (eight) hours.   ARIPiprazole  (ABILIFY ) 10 MG tablet Take 10 mg by mouth daily.   busPIRone  (BUSPAR ) 15 MG tablet Take 15 mg by mouth 3 (three) times daily.   chlorhexidine  (HIBICLENS ) 4 % external liquid Apply topically every other day. topical, Once A Day Every Other Day, Clean between all toes to both feet daily with Hibiclens  and soft gauze every other day   cyanocobalamin  1000 MCG tablet Take 1,000 mcg by mouth daily.   diclofenac Sodium (VOLTAREN) 1 % GEL Apply 2 g topically 2 (two) times daily. Apply to both hands   DULoxetine  (CYMBALTA ) 60 MG capsule Take 60 mg by mouth daily.   eszopiclone  (LUNESTA ) 1 MG TABS tablet Take 1 tablet (1 mg total) by mouth at bedtime. Take immediately before bedtime   furosemide  (LASIX ) 20 MG tablet Take 20 mg by mouth.   isosorbide dinitrate (ISORDIL) 10 MG tablet Take 10 mg by mouth 2 (two) times daily.   modafinil  (PROVIGIL ) 100 MG tablet Take 1 tablet (100 mg total) by mouth daily.   Polyethyl Glycol-Propyl Glycol (SYSTANE) 0.4-0.3 % SOLN Apply to eye 2 (two) times daily as needed.   rosuvastatin (CRESTOR) 5 MG tablet Take 5 mg by mouth at bedtime.   senna-docusate (SENOKOT-S) 8.6-50 MG tablet Take 1 tablet by mouth once as needed for mild constipation.   SUMAtriptan  (IMITREX ) 100 MG tablet Take 100 mg by mouth every 2 (two) hours as needed for migraine or headache. May repeat in 2 hours if headache persists or recurs.   tetrabenazine (XENAZINE) 25 MG tablet Take 50 mg by mouth 3 (three) times daily.   UNABLE TO FIND Diet: Regular   warfarin (COUMADIN) 4 MG tablet Take 4 mg by mouth daily.   No facility-administered encounter medications on file as of 12/16/2024.     SIGNIFICANT DIAGNOSTIC EXAMS  LABS REVIEWED PREVIOUS    01-12-24: vitamin B12: 225; folate 10.7; tsh 2.081; iron 77 tibc 334 03-18-24: glucose 89; bun 23 creat 0.67; k+ 3.8; na++ 138; ca 9.3 gfr >60; protein 6.5 albumin 3.6; sed rate 10; vitamin B12: 1054;  RA factor: <10; ANA neg  11-12-24: inr 1.2 on 5 mg daily 11-18-24: INR 6.6 on 6 mg daily   11-22-24: INR 8.6 on 6 mg daily  11-24-24: INR 3.8 5 mg on hold 11-26-24: INR 2.0 11-30-24: wbc 6.6; hgb 9.7; hct 31.4; mcv 100; plt 204; glucose 87; bun 23; creat 0.86; k+ 4.7; na++ 142; ca 8.9  gfr >60; protein 6.0; albumin 3.8  12-02-24: INR 4.3 12-08-24: INR 5.4  12-09-24: INR 5.0   NO NEW LABS.   Review of Systems  Constitutional:  Negative for malaise/fatigue.  Respiratory:  Negative for cough and shortness of breath.   Cardiovascular:  Positive for leg swelling. Negative for chest pain and palpitations.  Gastrointestinal:  Negative for abdominal pain, constipation and heartburn.  Musculoskeletal:  Negative for back pain, joint pain and myalgias.  Skin: Negative.   Neurological:  Negative for dizziness.  Psychiatric/Behavioral:  The patient is not nervous/anxious.     Physical Exam Constitutional:      General: She is not in acute distress.    Appearance: She is well-developed. She is not diaphoretic.  Neck:     Thyroid : No thyromegaly.  Cardiovascular:     Rate and Rhythm: Normal rate and regular rhythm.     Heart sounds: Normal heart sounds.  Pulmonary:     Effort: Pulmonary effort is normal. No respiratory distress.     Breath sounds: Normal breath sounds.  Abdominal:     General: Bowel sounds are normal. There is no distension.     Palpations: Abdomen is soft.     Tenderness: There is no abdominal tenderness.  Musculoskeletal:        General: Normal range of motion.     Cervical back: Neck supple.     Right lower leg: Edema present.     Left lower leg: Edema present.     Comments: Right >left   Lymphadenopathy:     Cervical: No cervical adenopathy.  Skin:    General: Skin is warm  and dry.  Neurological:     Mental Status: She is alert. Mental status is at baseline.     Comments: Has significant hand tremors and facial movements.     Psychiatric:        Mood and Affect: Mood normal.     ASSESSMENT/ PLAN:  TODAY  Bilateral lower extremity edema: right >left: will get doppler of right lower extremity to exclude dvt; will increase lasix  to 40 mg daily and will repeat labs in one week.    Barnie Seip NP [facility]   call [phone]     [1]  Allergies Allergen Reactions   Ticlid [Ticlopidine] Shortness Of Breath   Nsaids Other (See Comments)    Bleeding ulcer   Prednisone Other (See Comments)    Makes her stomach hurt--knows this isn't an allergy but doesn't want to take it    Trazodone And Nefazodone      hjallucinations   Monistat [Miconazole] Swelling and Rash   Myrbetriq  [Mirabegron  Er] Rash    See 02/27/2023   Sulfa Antibiotics Rash   "

## 2024-12-17 ENCOUNTER — Other Ambulatory Visit (HOSPITAL_COMMUNITY): Admission: RE | Admit: 2024-12-17 | Source: Skilled Nursing Facility

## 2024-12-17 ENCOUNTER — Non-Acute Institutional Stay: Payer: Self-pay | Admitting: Adult Health

## 2024-12-17 DIAGNOSIS — I480 Paroxysmal atrial fibrillation: Secondary | ICD-10-CM

## 2024-12-17 DIAGNOSIS — Z7901 Long term (current) use of anticoagulants: Secondary | ICD-10-CM

## 2024-12-17 DIAGNOSIS — I82512 Chronic embolism and thrombosis of left femoral vein: Secondary | ICD-10-CM

## 2024-12-17 LAB — PROTIME-INR
INR: 2.5 — ABNORMAL HIGH (ref 0.8–1.2)
Prothrombin Time: 28.2 s — ABNORMAL HIGH (ref 11.4–15.2)

## 2024-12-17 NOTE — Progress Notes (Signed)
 " Location:  [facility] Room Number: 101 Place of Service:  SNF (31)   CODE STATUS: full   Allergies[1]  Chief Complaint  Patient presents with   Acute Visit    INR management     HPI:  Her coumadin has been on hold due to elevated INR. She is on long term coumadin therapy for afib with dvt. She has failed eliquis  therapy. Her INR goal is 2.5-3.5. she does have bruising present. She does have bilateral lower extremity edema present.   Past Medical History:  Diagnosis Date   Anemia    Arthritis    Asthma    Atrial fibrillation (HCC)    COVID-19 08/05/2022   Depression    Elevated C-reactive protein (CRP) 06/27/2023   H/O head injury    Hearing loss    Heart murmur    can be heard at times, Doctor said not to worry   History of kidney stones    History of stomach ulcers    Migraine    Pneumonia    Psittacosis    Stroke (HCC)    Weakness of neck 03/02/2013    Past Surgical History:  Procedure Laterality Date   ANKLE FRACTURE SURGERY Left    APPENDECTOMY     BREAST LUMPECTOMY     CESAREAN SECTION     x 3   I & D KNEE WITH POLY EXCHANGE Left 08/16/2022   Procedure: KNEE POLY EXCHANGE;  Surgeon: Cristy Bonner DASEN, MD;  Location: [facility] OR;  Service: Orthopedics;  Laterality: Left;   KNEE ARTHROSCOPY WITH PATELLAR TENDON REPAIR Left 08/16/2022   Procedure: IRRIGATION AND DEBRIDEMENT LEFT KNEE REVISION WITH PATELLAR TENDON REPAIR and poly exchange;  Surgeon: Cristy Bonner DASEN, MD;  Location: [facility] OR;  Service: Orthopedics;  Laterality: Left;   PARTIAL HYSTERECTOMY     ROTATOR CUFF REPAIR Right    TOTAL KNEE ARTHROPLASTY Left 07/22/2022   Procedure: TOTAL KNEE ARTHROPLASTY;  Surgeon: Edna Toribio LABOR, MD;  Location: [facility] ORS;  Service: Orthopedics;  Laterality: Left;    Social History   Socioeconomic History   Marital status: Married    Spouse name: Not on file   Number of children: 3   Years of education: Not on file   Highest education level: Not on  file  Occupational History   Occupation: retired  Tobacco Use   Smoking status: Never   Smokeless tobacco: Never  Vaping Use   Vaping status: Never Used  Substance and Sexual Activity   Alcohol  use: Not Currently    Comment: occ beer   Drug use: No   Sexual activity: Not Currently    Birth control/protection: Surgical    Comment: hyst  Other Topics Concern   Not on file  Social History Narrative   Not on file   Social Drivers of Health   Tobacco Use: Low Risk (12/10/2024)   Patient History    Smoking Tobacco Use: Never    Smokeless Tobacco Use: Never    Passive Exposure: Not on file  Financial Resource Strain: Not on file  Food Insecurity: No Food Insecurity (08/15/2022)   Hunger Vital Sign    Worried About Running Out of Food in the Last Year: Never true    Ran Out of Food in the Last Year: Never true  Transportation Needs: No Transportation Needs (08/15/2022)   PRAPARE - Administrator, Civil Service (Medical): No    Lack of Transportation (Non-Medical): No  Physical Activity:  Not on file  Stress: Not on file  Social Connections: Not on file  Intimate Partner Violence: Not At Risk (08/15/2022)   Humiliation, Afraid, Rape, and Kick questionnaire    Fear of Current or Ex-Partner: No    Emotionally Abused: No    Physically Abused: No    Sexually Abused: No  Depression (PHQ2-9): High Risk (09/23/2024)   Depression (PHQ2-9)    PHQ-2 Score: 17  Alcohol  Screen: Not on file  Housing: Low Risk (08/15/2022)   Housing    Last Housing Risk Score: 0  Utilities: Not At Risk (08/15/2022)   AHC Utilities    Threatened with loss of utilities: No  Health Literacy: Not on file   Family History  Problem Relation Age of Onset   Cancer Father    Heart disease Brother    Alcohol  abuse Brother    Depression Maternal Grandmother    Depression Grandchild    Breast cancer Daughter       VITAL SIGNS BP 112/65   Pulse 78   Temp 98.6 F (37 C)   Resp 20   Ht 5'  (1.524 m)   Wt 157 lb 3.2 oz (71.3 kg)   SpO2 97%   BMI 30.70 kg/m   Outpatient Encounter Medications as of 12/17/2024  Medication Sig   acetaminophen  (TYLENOL ) 650 MG CR tablet Take 650 mg by mouth every 8 (eight) hours.   ARIPiprazole  (ABILIFY ) 10 MG tablet Take 10 mg by mouth daily.   busPIRone  (BUSPAR ) 15 MG tablet Take 15 mg by mouth 3 (three) times daily.   chlorhexidine  (HIBICLENS ) 4 % external liquid Apply topically every other day. topical, Once A Day Every Other Day, Clean between all toes to both feet daily with Hibiclens  and soft gauze every other day   cyanocobalamin  1000 MCG tablet Take 1,000 mcg by mouth daily.   diclofenac Sodium (VOLTAREN) 1 % GEL Apply 2 g topically 2 (two) times daily. Apply to both hands   DULoxetine  (CYMBALTA ) 60 MG capsule Take 60 mg by mouth daily.   eszopiclone  (LUNESTA ) 1 MG TABS tablet Take 1 tablet (1 mg total) by mouth at bedtime. Take immediately before bedtime   furosemide  (LASIX ) 20 MG tablet Take 40 mg by mouth daily.   isosorbide dinitrate (ISORDIL) 10 MG tablet Take 10 mg by mouth 2 (two) times daily.   modafinil  (PROVIGIL ) 100 MG tablet Take 1 tablet (100 mg total) by mouth daily.   Polyethyl Glycol-Propyl Glycol (SYSTANE) 0.4-0.3 % SOLN Apply to eye 2 (two) times daily as needed.   rosuvastatin (CRESTOR) 5 MG tablet Take 5 mg by mouth at bedtime.   senna-docusate (SENOKOT-S) 8.6-50 MG tablet Take 1 tablet by mouth once as needed for mild constipation.   SUMAtriptan  (IMITREX ) 100 MG tablet Take 100 mg by mouth every 2 (two) hours as needed for migraine or headache. May repeat in 2 hours if headache persists or recurs.   tetrabenazine (XENAZINE) 25 MG tablet Take 50 mg by mouth 3 (three) times daily.   UNABLE TO FIND Diet: Regular   [DISCONTINUED] warfarin (COUMADIN) 4 MG tablet Take 4 mg by mouth daily.   No facility-administered encounter medications on file as of 12/17/2024.     SIGNIFICANT DIAGNOSTIC EXAMS  LABS REVIEWED PREVIOUS    01-12-24: vitamin B12: 225; folate 10.7; tsh 2.081; iron 77 tibc 334 03-18-24: glucose 89; bun 23 creat 0.67; k+ 3.8; na++ 138; ca 9.3 gfr >60; protein 6.5 albumin 3.6; sed rate 10; vitamin B12: 1054;  RA factor: <10; ANA neg  11-12-24: inr 1.2 on 5 mg daily 11-18-24: INR 6.6 on 6 mg daily   11-22-24: INR 8.6 on 6 mg daily  11-24-24: INR 3.8 5 mg on hold 11-26-24: INR 2.0 11-30-24: wbc 6.6; hgb 9.7; hct 31.4; mcv 100; plt 204; glucose 87; bun 23; creat 0.86; k+ 4.7; na++ 142; ca 8.9  gfr >60; protein 6.0; albumin 3.8  12-02-24: INR 4.3 12-08-24: INR 5.4  12-09-24: INR 5.0   TODAY  12-12-23: wbc 75; hgb 10.5; hct 33.5; mcv 99.4 plt 242; INR 4.2 12-13-24: INR 5.2 12-17-24: INR 2.5    LABS REVIEWED PREVIOUS   01-12-24: vitamin B12: 225; folate 10.7; tsh 2.081; iron 77 tibc 334 03-18-24: glucose 89; bun 23 creat 0.67; k+ 3.8; na++ 138; ca 9.3 gfr >60; protein 6.5 albumin 3.6; sed rate 10; vitamin B12: 1054; RA factor: <10; ANA neg  11-12-24: inr 1.2 on 5 mg daily 11-18-24: INR 6.6 on 6 mg daily   11-22-24: INR 8.6 on 6 mg daily  11-24-24: INR 3.8 5 mg on hold 11-26-24: INR 2.0 11-30-24: wbc 6.6; hgb 9.7; hct 31.4; mcv 100; plt 204; glucose 87; bun 23; creat 0.86; k+ 4.7; na++ 142; ca 8.9  gfr >60; protein 6.0; albumin 3.8  12-02-24: INR 4.3 12-08-24: INR 5.4  12-09-24: INR 5.0   NO NEW LABS.    Review of Systems  Constitutional:  Negative for malaise/fatigue.  Respiratory:  Negative for cough and shortness of breath.   Cardiovascular:  Positive for leg swelling. Negative for chest pain and palpitations.  Gastrointestinal:  Negative for abdominal pain, constipation and heartburn.  Musculoskeletal:  Negative for back pain, joint pain and myalgias.  Skin: Negative.   Neurological:  Negative for dizziness.  Psychiatric/Behavioral:  The patient is not nervous/anxious.     Physical Exam Constitutional:      General: She is not in acute distress.    Appearance: She is well-developed. She is not diaphoretic.   Neck:     Thyroid : No thyromegaly.  Cardiovascular:     Rate and Rhythm: Normal rate and regular rhythm.     Heart sounds: Normal heart sounds.  Pulmonary:     Effort: Pulmonary effort is normal. No respiratory distress.     Breath sounds: Normal breath sounds.  Abdominal:     General: Bowel sounds are normal. There is no distension.     Palpations: Abdomen is soft.     Tenderness: There is no abdominal tenderness.  Musculoskeletal:        General: Normal range of motion.     Cervical back: Neck supple.     Right lower leg: Edema present.     Left lower leg: Edema present.     Comments: Right >left  Lymphadenopathy:     Cervical: No cervical adenopathy.  Skin:    General: Skin is warm and dry.  Neurological:     Mental Status: She is alert. Mental status is at baseline.  Psychiatric:        Mood and Affect: Mood normal.       ASSESSMENT/ PLAN:  TODAY  Paroxysmal atrial fibrillation Chronic lower extremity edema Chronic anticoagulation  For INR 2.5: will begin coumadin 1 mg daily and will repeat INR on 12-24-24   Barnie Seip NP [facility]  Contact [phone] Monday through Friday 8am- 5pm  After hours call [phone]      [1]  Allergies Allergen Reactions   Ticlid [Ticlopidine] Shortness Of  Breath   Nsaids Other (See Comments)    Bleeding ulcer   Prednisone Other (See Comments)    Makes her stomach hurt--knows this isn't an allergy but doesn't want to take it    Trazodone And Nefazodone      hjallucinations   Monistat [Miconazole] Swelling and Rash   Myrbetriq  [Mirabegron  Er] Rash    See 02/27/2023   Sulfa Antibiotics Rash   "
